# Patient Record
Sex: Male | Born: 1975
Health system: Southern US, Community
[De-identification: ages and names within clinical notes are randomized; demographics above are authoritative.]

## PROBLEM LIST (undated history)

## (undated) ENCOUNTER — Emergency Department (HOSPITAL_COMMUNITY): Payer: BC Managed Care – PPO | Source: Home / Self Care

## (undated) DIAGNOSIS — C221 Intrahepatic bile duct carcinoma: Secondary | ICD-10-CM

## (undated) DIAGNOSIS — M654 Radial styloid tenosynovitis [de Quervain]: Secondary | ICD-10-CM

## (undated) DIAGNOSIS — Z807 Family history of other malignant neoplasms of lymphoid, hematopoietic and related tissues: Secondary | ICD-10-CM

## (undated) DIAGNOSIS — C801 Malignant (primary) neoplasm, unspecified: Secondary | ICD-10-CM

## (undated) DIAGNOSIS — Z803 Family history of malignant neoplasm of breast: Secondary | ICD-10-CM

## (undated) DIAGNOSIS — I10 Essential (primary) hypertension: Secondary | ICD-10-CM

## (undated) DIAGNOSIS — E785 Hyperlipidemia, unspecified: Secondary | ICD-10-CM

## (undated) DIAGNOSIS — E133299 Other specified diabetes mellitus with mild nonproliferative diabetic retinopathy without macular edema, unspecified eye: Secondary | ICD-10-CM

## (undated) HISTORY — DX: Other specified diabetes mellitus with mild nonproliferative diabetic retinopathy without macular edema, unspecified eye: E13.3299

## (undated) HISTORY — PX: VASECTOMY: SHX75

## (undated) HISTORY — DX: Family history of other malignant neoplasms of lymphoid, hematopoietic and related tissues: Z80.7

## (undated) HISTORY — PX: CARPAL TUNNEL RELEASE: SHX101

## (undated) HISTORY — DX: Family history of malignant neoplasm of breast: Z80.3

## (undated) HISTORY — DX: Malignant (primary) neoplasm, unspecified: C80.1

## (undated) HISTORY — PX: TONSILLECTOMY AND ADENOIDECTOMY: SHX28

## (undated) MED FILL — Medication: Fill #0 | Status: CN

---

## 1998-07-29 ENCOUNTER — Emergency Department (HOSPITAL_COMMUNITY): Admission: EM | Admit: 1998-07-29 | Discharge: 1998-07-29 | Payer: Self-pay | Admitting: Emergency Medicine

## 1999-04-26 ENCOUNTER — Encounter: Payer: Self-pay | Admitting: Emergency Medicine

## 1999-04-26 ENCOUNTER — Emergency Department (HOSPITAL_COMMUNITY): Admission: EM | Admit: 1999-04-26 | Discharge: 1999-04-26 | Payer: Self-pay | Admitting: Emergency Medicine

## 2001-09-22 ENCOUNTER — Ambulatory Visit (HOSPITAL_BASED_OUTPATIENT_CLINIC_OR_DEPARTMENT_OTHER): Admission: RE | Admit: 2001-09-22 | Discharge: 2001-09-22 | Payer: Self-pay | Admitting: Family Medicine

## 2005-04-25 ENCOUNTER — Ambulatory Visit (HOSPITAL_COMMUNITY): Admission: RE | Admit: 2005-04-25 | Discharge: 2005-04-25 | Payer: Self-pay | Admitting: Family Medicine

## 2005-05-14 ENCOUNTER — Ambulatory Visit (HOSPITAL_COMMUNITY): Admission: RE | Admit: 2005-05-14 | Discharge: 2005-05-14 | Payer: Self-pay | Admitting: Orthopedic Surgery

## 2006-01-13 ENCOUNTER — Emergency Department (HOSPITAL_COMMUNITY): Admission: EM | Admit: 2006-01-13 | Discharge: 2006-01-13 | Payer: Self-pay | Admitting: Emergency Medicine

## 2006-01-18 ENCOUNTER — Emergency Department (HOSPITAL_COMMUNITY): Admission: EM | Admit: 2006-01-18 | Discharge: 2006-01-18 | Payer: Self-pay | Admitting: Family Medicine

## 2009-05-15 ENCOUNTER — Emergency Department (HOSPITAL_COMMUNITY): Admission: EM | Admit: 2009-05-15 | Discharge: 2009-05-15 | Payer: Self-pay | Admitting: Family Medicine

## 2010-04-02 ENCOUNTER — Inpatient Hospital Stay (INDEPENDENT_AMBULATORY_CARE_PROVIDER_SITE_OTHER)
Admission: RE | Admit: 2010-04-02 | Discharge: 2010-04-02 | Disposition: A | Discharge status: Home / Self Care | Payer: 59 | Source: Ambulatory Visit | Attending: Emergency Medicine | Admitting: Emergency Medicine

## 2010-04-02 DIAGNOSIS — J029 Acute pharyngitis, unspecified: Secondary | ICD-10-CM

## 2010-04-02 LAB — POCT INFECTIOUS MONO SCREEN: Mono Screen: NEGATIVE

## 2010-08-12 ENCOUNTER — Inpatient Hospital Stay (INDEPENDENT_AMBULATORY_CARE_PROVIDER_SITE_OTHER)
Admission: RE | Admit: 2010-08-12 | Discharge: 2010-08-12 | Disposition: A | Discharge status: Home / Self Care | Payer: 59 | Source: Ambulatory Visit | Attending: Family Medicine | Admitting: Family Medicine

## 2010-08-12 DIAGNOSIS — J020 Streptococcal pharyngitis: Secondary | ICD-10-CM

## 2010-08-12 LAB — POCT RAPID STREP A: Streptococcus, Group A Screen (Direct): NEGATIVE

## 2011-02-07 ENCOUNTER — Other Ambulatory Visit: Payer: Self-pay | Admitting: Orthopedic Surgery

## 2011-03-08 DIAGNOSIS — M654 Radial styloid tenosynovitis [de Quervain]: Secondary | ICD-10-CM

## 2011-03-08 HISTORY — DX: Radial styloid tenosynovitis (de quervain): M65.4

## 2011-03-12 ENCOUNTER — Encounter (HOSPITAL_BASED_OUTPATIENT_CLINIC_OR_DEPARTMENT_OTHER): Payer: Self-pay | Admitting: *Deleted

## 2011-03-12 NOTE — Pre-Procedure Instructions (Signed)
To come for BMET and EKG 

## 2011-03-13 ENCOUNTER — Encounter (HOSPITAL_BASED_OUTPATIENT_CLINIC_OR_DEPARTMENT_OTHER)
Admission: RE | Admit: 2011-03-13 | Discharge: 2011-03-13 | Disposition: A | Discharge status: Home / Self Care | Payer: 59 | Source: Ambulatory Visit | Attending: Orthopedic Surgery | Admitting: Orthopedic Surgery

## 2011-03-13 ENCOUNTER — Other Ambulatory Visit: Payer: Self-pay

## 2011-03-13 LAB — BASIC METABOLIC PANEL
BUN: 11 mg/dL (ref 6–23)
CO2: 29 mEq/L (ref 19–32)
Calcium: 9.9 mg/dL (ref 8.4–10.5)
Chloride: 100 mEq/L (ref 96–112)
Creatinine, Ser: 0.91 mg/dL (ref 0.50–1.35)
GFR calc Af Amer: >90 mL/min (ref >90)
Sodium: 138 mEq/L (ref 135–145)

## 2011-03-14 ENCOUNTER — Ambulatory Visit (HOSPITAL_BASED_OUTPATIENT_CLINIC_OR_DEPARTMENT_OTHER)
Admission: RE | Admit: 2011-03-14 | Discharge: 2011-03-14 | Disposition: A | Discharge status: Home / Self Care | Payer: 59 | Source: Ambulatory Visit | Attending: Orthopedic Surgery | Admitting: Orthopedic Surgery

## 2011-03-14 ENCOUNTER — Encounter (HOSPITAL_BASED_OUTPATIENT_CLINIC_OR_DEPARTMENT_OTHER): Payer: Self-pay | Admitting: Certified Registered Nurse Anesthetist

## 2011-03-14 ENCOUNTER — Encounter (HOSPITAL_BASED_OUTPATIENT_CLINIC_OR_DEPARTMENT_OTHER): Payer: Self-pay | Admitting: *Deleted

## 2011-03-14 ENCOUNTER — Encounter (HOSPITAL_BASED_OUTPATIENT_CLINIC_OR_DEPARTMENT_OTHER)
Admission: RE | Disposition: A | Discharge status: Home / Self Care | Payer: Self-pay | Source: Ambulatory Visit | Attending: Orthopedic Surgery

## 2011-03-14 ENCOUNTER — Ambulatory Visit (HOSPITAL_BASED_OUTPATIENT_CLINIC_OR_DEPARTMENT_OTHER): Payer: 59 | Admitting: Certified Registered Nurse Anesthetist

## 2011-03-14 DIAGNOSIS — E785 Hyperlipidemia, unspecified: Secondary | ICD-10-CM | POA: Insufficient documentation

## 2011-03-14 DIAGNOSIS — E119 Type 2 diabetes mellitus without complications: Secondary | ICD-10-CM | POA: Insufficient documentation

## 2011-03-14 DIAGNOSIS — I10 Essential (primary) hypertension: Secondary | ICD-10-CM | POA: Insufficient documentation

## 2011-03-14 DIAGNOSIS — M654 Radial styloid tenosynovitis [de Quervain]: Secondary | ICD-10-CM | POA: Insufficient documentation

## 2011-03-14 DIAGNOSIS — Z87891 Personal history of nicotine dependence: Secondary | ICD-10-CM | POA: Insufficient documentation

## 2011-03-14 DIAGNOSIS — K219 Gastro-esophageal reflux disease without esophagitis: Secondary | ICD-10-CM | POA: Insufficient documentation

## 2011-03-14 HISTORY — DX: Radial styloid tenosynovitis (de quervain): M65.4

## 2011-03-14 HISTORY — DX: Essential (primary) hypertension: I10

## 2011-03-14 HISTORY — DX: Hyperlipidemia, unspecified: E78.5

## 2011-03-14 HISTORY — PX: DORSAL COMPARTMENT RELEASE: SHX5039

## 2011-03-14 LAB — POCT HEMOGLOBIN-HEMACUE: Hemoglobin: 16 g/dL (ref 13.0–17.0)

## 2011-03-14 LAB — GLUCOSE, CAPILLARY: Glucose-Capillary: 173 mg/dL — ABNORMAL HIGH (ref 70–99)

## 2011-03-14 SURGERY — RELEASE, FIRST DORSAL COMPARTMENT, HAND
Anesthesia: General | Laterality: Left

## 2011-03-14 MED ORDER — CEPHALEXIN 500 MG PO CAPS: 500.0000 mg | ORAL_CAPSULE | Freq: Four times a day (QID) | ORAL | Status: AC

## 2011-03-14 MED ORDER — PROMETHAZINE HCL 25 MG/ML IJ SOLN: 6.2500 mg | INTRAMUSCULAR | Status: DC | PRN

## 2011-03-14 MED ORDER — LIDOCAINE HCL (CARDIAC) 20 MG/ML IV SOLN
INTRAVENOUS | Status: DC | PRN
  Administered 2011-03-14: 60 mg via INTRAVENOUS

## 2011-03-14 MED ORDER — CEFAZOLIN SODIUM 1-5 GM-% IV SOLN: 1.0000 g | INTRAVENOUS | Status: DC

## 2011-03-14 MED ORDER — MIDAZOLAM HCL 5 MG/5ML IJ SOLN
INTRAMUSCULAR | Status: DC | PRN
  Administered 2011-03-14: 2 mg via INTRAVENOUS

## 2011-03-14 MED ORDER — POVIDONE-IODINE 7.5 % EX SOLN: Freq: Once | CUTANEOUS | Status: DC

## 2011-03-14 MED ORDER — SODIUM CHLORIDE 0.45 % IV SOLN: INTRAVENOUS | Status: DC

## 2011-03-14 MED ORDER — FENTANYL CITRATE 0.05 MG/ML IJ SOLN
INTRAMUSCULAR | Status: DC | PRN
  Administered 2011-03-14: 50 ug via INTRAVENOUS
  Administered 2011-03-14: 100 ug via INTRAVENOUS

## 2011-03-14 MED ORDER — ONDANSETRON HCL 4 MG/2ML IJ SOLN
INTRAMUSCULAR | Status: DC | PRN
  Administered 2011-03-14: 4 mg via INTRAVENOUS

## 2011-03-14 MED ORDER — PROPOFOL 10 MG/ML IV EMUL
INTRAVENOUS | Status: DC | PRN
  Administered 2011-03-14: 400 mg via INTRAVENOUS

## 2011-03-14 MED ORDER — DROPERIDOL 2.5 MG/ML IJ SOLN
INTRAMUSCULAR | Status: DC | PRN
  Administered 2011-03-14: 0.625 mg via INTRAVENOUS

## 2011-03-14 MED ORDER — CEFAZOLIN SODIUM-DEXTROSE 2-3 GM-% IV SOLR
2.0000 g | INTRAVENOUS | Status: AC
  Administered 2011-03-14: 2 g via INTRAVENOUS

## 2011-03-14 MED ORDER — BETAMETHASONE SOD PHOS & ACET 6 (3-3) MG/ML IJ SUSP
INTRAMUSCULAR | Status: DC | PRN
  Administered 2011-03-14: 1.5 mL via INTRA_ARTICULAR

## 2011-03-14 MED ORDER — FENTANYL CITRATE 0.05 MG/ML IJ SOLN: 25.0000 ug | INTRAMUSCULAR | Status: DC | PRN

## 2011-03-14 MED ORDER — OXYCODONE HCL 5 MG PO CAPS: 10.0000 mg | ORAL_CAPSULE | ORAL | Status: AC | PRN

## 2011-03-14 MED ORDER — LACTATED RINGERS IV SOLN
INTRAVENOUS | Status: DC
  Administered 2011-03-14 (×2): via INTRAVENOUS

## 2011-03-14 MED ORDER — BUPIVACAINE HCL (PF) 0.25 % IJ SOLN
INTRAMUSCULAR | Status: DC | PRN
  Administered 2011-03-14: 19 mL

## 2011-03-14 SURGICAL SUPPLY — 51 items
APL SKNCLS STERI-STRIP NONHPOA (GAUZE/BANDAGES/DRESSINGS) ×1
BANDAGE CONFORM 3  STR LF (GAUZE/BANDAGES/DRESSINGS) ×2 IMPLANT
BANDAGE ELASTIC 3 VELCRO ST LF (GAUZE/BANDAGES/DRESSINGS) ×2 IMPLANT
BENZOIN TINCTURE PRP APPL 2/3 (GAUZE/BANDAGES/DRESSINGS) ×2 IMPLANT
BLADE SURG 15 STRL LF DISP TIS (BLADE) ×2 IMPLANT
BLADE SURG 15 STRL SS (BLADE) ×4
BRUSH SCRUB EZ PLAIN DRY (MISCELLANEOUS) ×2 IMPLANT
CLOSURE STERI STRIP 1/2 X4 (GAUZE/BANDAGES/DRESSINGS) ×1 IMPLANT
CLOTH BEACON ORANGE TIMEOUT ST (SAFETY) ×2 IMPLANT
CORDS BIPOLAR (ELECTRODE) ×1 IMPLANT
COVER MAYO STAND STRL (DRAPES) ×2 IMPLANT
COVER TABLE BACK 60X90 (DRAPES) ×2 IMPLANT
CUFF TOURNIQUET SINGLE 18IN (TOURNIQUET CUFF) ×1 IMPLANT
DRAPE EXTREMITY T 121X128X90 (DRAPE) ×2 IMPLANT
DRAPE SURG 17X23 STRL (DRAPES) ×2 IMPLANT
DRSG EMULSION OIL 3X3 NADH (GAUZE/BANDAGES/DRESSINGS) ×1 IMPLANT
GAUZE SPONGE 4X4 16PLY XRAY LF (GAUZE/BANDAGES/DRESSINGS) IMPLANT
GLOVE BIOGEL M STRL SZ7.5 (GLOVE) ×1 IMPLANT
GLOVE SS BIOGEL STRL SZ 8 (GLOVE) ×1 IMPLANT
GLOVE SUPERSENSE BIOGEL SZ 8 (GLOVE) ×1
GOWN PREVENTION PLUS XLARGE (GOWN DISPOSABLE) ×2 IMPLANT
GOWN PREVENTION PLUS XXLARGE (GOWN DISPOSABLE) ×2 IMPLANT
NDL HYPO 25X1 1.5 SAFETY (NEEDLE) ×1 IMPLANT
NDL SAFETY ECLIPSE 18X1.5 (NEEDLE) IMPLANT
NEEDLE HYPO 18GX1.5 SHARP (NEEDLE)
NEEDLE HYPO 25X1 1.5 SAFETY (NEEDLE) ×2 IMPLANT
NS IRRIG 1000ML POUR BTL (IV SOLUTION) ×2 IMPLANT
PACK BASIN DAY SURGERY FS (CUSTOM PROCEDURE TRAY) ×2 IMPLANT
PAD CAST 3X4 CTTN HI CHSV (CAST SUPPLIES) ×2 IMPLANT
PADDING CAST ABS 3INX4YD NS (CAST SUPPLIES)
PADDING CAST ABS 4INX4YD NS (CAST SUPPLIES) ×1
PADDING CAST ABS COTTON 3X4 (CAST SUPPLIES) IMPLANT
PADDING CAST ABS COTTON 4X4 ST (CAST SUPPLIES) ×1 IMPLANT
PADDING CAST COTTON 3X4 STRL (CAST SUPPLIES) ×4
SPLINT FIBERGLASS 3X35 (CAST SUPPLIES) ×1 IMPLANT
SPLINT PLASTER CAST XFAST 3X15 (CAST SUPPLIES) IMPLANT
SPLINT PLASTER XTRA FASTSET 3X (CAST SUPPLIES)
SPONGE GAUZE 4X4 12PLY (GAUZE/BANDAGES/DRESSINGS) ×1 IMPLANT
STOCKINETTE 4X48 STRL (DRAPES) ×2 IMPLANT
STOCKINETTE SYNTHETIC 3 UNSTER (CAST SUPPLIES) ×1 IMPLANT
STOCKINETTE TUBULAR COTT 4X25 (GAUZE/BANDAGES/DRESSINGS) IMPLANT
STOCKINETTE TUBULAR COTTON 2IN (GAUZE/BANDAGES/DRESSINGS) IMPLANT
STRIP CLOSURE SKIN 1/2X4 (GAUZE/BANDAGES/DRESSINGS) ×2 IMPLANT
SUT PROLENE 4 0 PS 2 18 (SUTURE) ×2 IMPLANT
SYR BULB 3OZ (MISCELLANEOUS) ×2 IMPLANT
SYR CONTROL 10ML LL (SYRINGE) ×2 IMPLANT
TOWEL OR 17X24 6PK STRL BLUE (TOWEL DISPOSABLE) ×2 IMPLANT
TOWEL OR NON WOVEN STRL DISP B (DISPOSABLE) ×3 IMPLANT
TRAY DSU PREP LF (CUSTOM PROCEDURE TRAY) ×2 IMPLANT
UNDERPAD 30X30 INCONTINENT (UNDERPADS AND DIAPERS) ×2 IMPLANT
WATER STERILE IRR 1000ML POUR (IV SOLUTION) ×1 IMPLANT

## 2011-03-14 NOTE — Transfer of Care (Signed)
Immediate Anesthesia Transfer of Care Note  Patient: Matthew Matthew  Procedure(s) Performed:  RELEASE DORSAL COMPARTMENT (DEQUERVAIN) - left wrist APL and EPL tenosynovectomy and 1st dorsal compartment release  Patient Location: PACU  Anesthesia Type: General  Level of Consciousness: sedated  Airway & Oxygen Therapy: Patient Spontanous Breathing and Patient connected to face mask oxygen  Post-op Assessment: Report given to PACU RN and Post -op Vital signs reviewed and stable  Post vital signs: Reviewed and stable  Complications: No apparent anesthesia complications

## 2011-03-14 NOTE — Anesthesia Postprocedure Evaluation (Signed)
  Anesthesia Post-op Note  Patient: Matthew Osborne  Procedure(s) Performed:  RELEASE DORSAL COMPARTMENT (DEQUERVAIN) - left wrist APL and EPL tenosynovectomy and 1st dorsal compartment release  Patient Location: PACU  Anesthesia Type: General  Level of Consciousness: awake, alert  and oriented  Airway and Oxygen Therapy: Patient Spontanous Breathing  Post-op Pain: none  Post-op Assessment: Post-op Vital signs reviewed, Patient's Cardiovascular Status Stable, Respiratory Function Stable, Patent Airway, No signs of Nausea or vomiting and Pain level controlled  Post-op Vital Signs: Reviewed and stable  Complications: No apparent anesthesia complications

## 2011-03-14 NOTE — H&P (Signed)
Matthew Osborne is an 36 y.o. male.   Chief Complaint: Left wrist pain HPI: Patient presents for left wrist reconstruction.  Past Medical History  Diagnosis Date  . Diabetes mellitus     NIDDM  . Hypertension     under control; has been on med. x 4 yrs.  . Hyperlipidemia   . De Quervain's tenosynovitis, left 03/2011    Past Surgical History  Procedure Date  . Carpal tunnel release     bilat.  . Tonsillectomy and adenoidectomy as a child  . Vasectomy     History reviewed. No pertinent family history. Social History:  reports that he has never smoked. He has never used smokeless tobacco. He reports that he does not drink alcohol or use illicit drugs.  Allergies: No Known Allergies  Medications Prior to Admission  Medication Dose Route Frequency Provider Last Rate Last Dose  . 0.45 % sodium chloride infusion   Intravenous Continuous Sheran Lawless, PA      . ceFAZolin (ANCEF) IVPB 2 g/50 mL premix  2 g Intravenous 60 min Pre-Op Oletta Cohn III, MD      . lactated ringers infusion   Intravenous Continuous Zenon Mayo, MD 20 mL/hr at 03/14/11 1105    . povidone-iodine (BETADINE) 7.5 % scrub   Topical Once Sheran Lawless, PA      . DISCONTD: ceFAZolin (ANCEF) IVPB 1 g/50 mL premix  1 g Intravenous 60 min Pre-Op Sheran Lawless, PA       Medications Prior to Admission  Medication Sig Dispense Refill  . atorvastatin (LIPITOR) 40 MG tablet Take 40 mg by mouth daily. AM      . bisoprolol-hydrochlorothiazide (ZIAC) 10-6.25 MG per tablet Take 1 tablet by mouth daily. AM      . metFORMIN (GLUCOPHAGE) 1000 MG tablet Take 1,000 mg by mouth 2 (two) times daily with a meal.        Results for orders placed during the hospital encounter of 03/14/11 (from the past 48 hour(s))  BASIC METABOLIC PANEL     Status: Abnormal   Collection Time   03/13/11  4:23 PM      Component Value Range Comment   Sodium 138  135 - 145 (mEq/L)    Potassium 4.1  3.5 - 5.1 (mEq/L)    Chloride 100   96 - 112 (mEq/L)    CO2 29  19 - 32 (mEq/L)    Glucose, Bld 126 (*) 70 - 99 (mg/dL)    BUN 11  6 - 23 (mg/dL)    Creatinine, Ser 1.61  0.50 - 1.35 (mg/dL)    Calcium 9.9  8.4 - 10.5 (mg/dL)    GFR calc non Af Amer >90  >90 (mL/min)    GFR calc Af Amer >90  >90 (mL/min)   GLUCOSE, CAPILLARY     Status: Abnormal   Collection Time   03/14/11 11:10 AM      Component Value Range Comment   Glucose-Capillary 212 (*) 70 - 99 (mg/dL)   POCT HEMOGLOBIN-HEMACUE     Status: Normal   Collection Time   03/14/11 11:13 AM      Component Value Range Comment   Hemoglobin 16.0  13.0 - 17.0 (g/dL)    No results found.  ROS  Blood pressure 132/86, pulse 52, temperature 97.7 F (36.5 C), resp. rate 20, height 6' (1.829 m), weight 124.739 kg (275 lb), SpO2 98.00%. Physical Exam ..The patient is alert and oriented in no  acute distress the patient complains of pain in the affected upper extremity. The patient is noted to have a normal HEENT exam. Lung fields show equal chest expansion and no shortness of breath abdomen exam is nontender without distention. Lower extremity examination does not show any fracture dislocation or blood clot symptoms. Pelvis is stable neck and back are stable and nontender Left wrist DC tenosynovitis with cyst formation and pain.  Ligaments stable  Assessment/Plan .Marland KitchenWe are planning surgery for your upper extremity. The risk and benefits of surgery include risk of bleeding infection anesthesia damage to normal structures and failure of the surgery to accomplish its intended goals of relieving symptoms and restoring function with this in mind we'll going to proceed. I have specifically discussed with the patient the pre-and postoperative regime and the does and don'ts and risk and benefits in great detail. Risk and benefits of surgery also include risk of dystrophy chronic nerve pain failure of the healing process to go onto completion and other inherent risks of surgery The relavent the  pathophysiology of the disease/injury process, as well as the alternatives for treatment and postoperative course of action has been discussed in great detail with the patient who desires to proceed.  We will do everything in our power to help you (the patient) restore function to the upper extremity. Is a pleasure to see this patient today.   Antwane Grose III,Betha Shadix M 03/14/2011, 1:18 PM

## 2011-03-14 NOTE — Brief Op Note (Signed)
03/14/2011  2:39 PM  PATIENT:  Matthew Osborne  36 y.o. male  PRE-OPERATIVE DIAGNOSIS:  left wrist dequervains  POST-OPERATIVE DIAGNOSIS:  left wrist dequervains  PROCEDURE:  Procedure(s): RELEASE DORSAL COMPARTMENT (DEQUERVAIN) with Tenosynovectomy extensor tendons left wrist  SURGEON:  Surgeon(s): Karen Chafe, MD  PHYSICIAN ASSISTANT:   ASSISTANTS: none   ANESTHESIA:   general  EBL:  Total I/O In: 1500 [I.V.:1500] Out: -   BLOOD ADMINISTERED:none  DRAINS: none   LOCAL MEDICATIONS USED:  MARCAINE 17CC  SPECIMEN:  No Specimen  DISPOSITION OF SPECIMEN:  N/A  COUNTS:  YES  TOURNIQUET:   Total Tourniquet Time Documented: Upper Arm (Left) - 24 minutes  DICTATION: .Other Dictation: Dictation Number [id number]  PLAN OF CARE: Discharge to home after PACU  PATIENT DISPOSITION:  PACU - hemodynamically stable.   Delay start of Pharmacological VTE agent (>24hrs) due to surgical blood loss or risk of bleeding:  {YES/NO/NOT APPLICABLE:20182

## 2011-03-14 NOTE — Anesthesia Preprocedure Evaluation (Addendum)
Anesthesia Evaluation  Patient identified by MRN, date of birth, ID band Patient awake    Reviewed: Allergy & Precautions, H&P , NPO status , Patient's Chart, lab work & pertinent test results, reviewed documented beta blocker date and time   History of Anesthesia Complications Negative for: history of anesthetic complications  Airway Mallampati: III TM Distance: >3 FB Neck ROM: Full  Mouth opening: Limited Mouth Opening  Dental No notable dental hx. (+) Teeth Intact and Dental Advisory Given   Pulmonary former smoker clear to auscultation  Pulmonary exam normal       Cardiovascular hypertension, Pt. on medications and Pt. on home beta blockers Regular Normal    Neuro/Psych Negative Neurological ROS     GI/Hepatic Neg liver ROS, GERD-  Controlled,  Endo/Other  Diabetes mellitus- (glu 212), Well Controlled, Type 2, Oral Hypoglycemic AgentsMorbid obesity  Renal/GU negative Renal ROS     Musculoskeletal   Abdominal (+) obese,   Peds  Hematology   Anesthesia Other Findings   Reproductive/Obstetrics                           Anesthesia Physical Anesthesia Plan  ASA: II  Anesthesia Plan: General   Post-op Pain Management:    Induction: Intravenous  Airway Management Planned: LMA  Additional Equipment:   Intra-op Plan:   Post-operative Plan:   Informed Consent: I have reviewed the patients History and Physical, chart, labs and discussed the procedure including the risks, benefits and alternatives for the proposed anesthesia with the patient or authorized representative who has indicated his/her understanding and acceptance.   Dental advisory given  Plan Discussed with: CRNA and Surgeon  Anesthesia Plan Comments: (Plan routine monitors, GA- LMA ok)        Anesthesia Quick Evaluation

## 2011-03-14 NOTE — Anesthesia Procedure Notes (Signed)
Procedure Name: LMA Insertion Date/Time: 03/14/2011 1:32 PM Performed by: Ashiyah Pavlak D Pre-anesthesia Checklist: Patient identified, Emergency Drugs available, Suction available and Patient being monitored Patient Re-evaluated:Patient Re-evaluated prior to inductionOxygen Delivery Method: Circle System Utilized Preoxygenation: Pre-oxygenation with 100% oxygen Intubation Type: IV induction Ventilation: Mask ventilation without difficulty LMA: LMA inserted LMA Size: 5.0 Number of attempts: 1 Placement Confirmation: positive ETCO2 Tube secured with: Tape Dental Injury: Teeth and Oropharynx as per pre-operative assessment

## 2011-03-15 NOTE — Op Note (Signed)
NAME:  Matthew Osborne, Matthew Osborne                      ACCOUNT NO.:  MEDICAL RECORD NO.:  [id number]  LOCATION:                                 FACILITY:  PHYSICIAN:  Dionne Ano. Felicitas Sine, M.D.     DATE OF BIRTH:  DATE OF PROCEDURE: DATE OF DISCHARGE:                              OPERATIVE REPORT   PREOPERATIVE DIAGNOSIS:  Chronic tenosynovitis, left first dorsal compartment with failure of conservative management.  POSTOPERATIVE DIAGNOSIS:  Chronic tenosynovitis, left first dorsal compartment with failure of conservative management.  PROCEDURE:  Radical tenosynovectomy, left wrist extensor apparatus with first dorsal compartment release.  SURGEON:  Dionne Ano. Amanda Pea, MD  ASSIST:  None.  COMPLICATIONS:  None.  ANESTHESIA:  General.  TOURNIQUET TIME:  Less than an hour.  DRAINS:  None.  INDICATIONS FOR THE PROCEDURE:  A 36 year old male with extensive tenosynovitis about the first  dorsal compartment.  I have counseled him in regards to risks and benefits of surgery, and he desires to proceed. All questions has been encouraged and answered preoperatively.  OPERATIVE PROCEDURE:  The patient was administered anesthesia, taken to the operative suite, underwent smooth induction of general anesthetic, time-out called.  He was prepped and draped in usual sterile fashion. Following this, a transverse incision was made over the dorsal distal wrist just proximal to the radial styloid.  Dissection was carried down. The superficial sensory nerves retracted out of harm's way, including superficial radial nerve.  The first dorsal compartment was released throughout its extent and following this, a radical tenosynovectomy of the APL and EPB tendons was accomplished.  He had marked tenosynovitis cystic formation x2 areas and a large amount of tenosynovitis which appeared to be not infectious.  After cleaning this out, I irrigated copiously, and performed further debridement as  necessary of  the tendons.  Following this, I then performed irrigation and closure of the wound after demonstrating full range of motion.  The patient does have some early synovitis and degenerative changes in the [facility] joint of the thumb, which we will watch closely.  This patient had rather marked findings and advanced tenosynovitic changes.  I am going to discharge him on Keflex, oxycodone and have him see [place] in the office in 6 days.  These notes have been discussed.  All questions have been encouraged and answered.     Dionne Ano. Amanda Pea, M.D.     [facility]  D:  03/14/2011  T:  03/15/2011  Job:  [id number]

## 2011-03-18 ENCOUNTER — Encounter (HOSPITAL_BASED_OUTPATIENT_CLINIC_OR_DEPARTMENT_OTHER): Payer: Self-pay | Admitting: Orthopedic Surgery

## 2011-08-06 ENCOUNTER — Encounter: Payer: 59 | Attending: Family Medicine | Admitting: *Deleted

## 2011-08-07 ENCOUNTER — Encounter: Payer: Self-pay | Admitting: *Deleted

## 2011-08-07 NOTE — Patient Instructions (Signed)
Goals:  Follow Diabetes Meal Plan as instructed  Eat 3 meals and 2 snacks, every 3-5 hrs  Limit carbohydrate intake to 45-60 grams carbohydrate/meal  Limit carbohydrate intake to 0-30 grams carbohydrate/snack  Add lean protein foods to meals/snacks  Monitor glucose levels as instructed by your doctor  Aim for 30 mins of physical activity daily as tolerated  Bring food record and glucose log to your next nutrition visit 

## 2011-08-07 NOTE — Progress Notes (Signed)
  Patient was seen on 08/06/2011 for the first of a series of three diabetes self-management courses at the [facility]. The following learning objectives were met by the patient during this course: Current A1c = 8.7%   Defines the role of glucose and insulin  Identifies type of diabetes and pathophysiology  Defines the diagnostic criteria for diabetes and prediabetes  States the risk factors for Type 2 Diabetes  States the symptoms of Type 2 Diabetes  Defines Type 2 Diabetes treatment goals  Defines Type 2 Diabetes treatment options  States the rationale for glucose monitoring  Identifies A1C, glucose targets, and testing times  Identifies proper sharps disposal  Defines the purpose of a diabetes food plan  Identifies carbohydrate food groups  Defines effects of carbohydrate foods on glucose levels  Identifies carbohydrate choices/grams/food labels  States benefits of physical activity and effect on glucose  Review of suggested activity guidelines  Handouts given during class include:  Type 2 Diabetes: Basics Book  My Food Plan Book  Food and Activity Log  Follow-Up Plan: Core Class 2

## 2011-08-15 ENCOUNTER — Ambulatory Visit: Payer: 59

## 2011-10-24 ENCOUNTER — Encounter: Payer: 59 | Attending: Family Medicine | Admitting: Dietician

## 2011-10-25 NOTE — Progress Notes (Signed)
  Patient was seen on 10/24/2011 for the second of a series of three diabetes self-management courses at the [facility]. The following learning objectives were met by the patient during this course:   Explain basic nutrition maintenance and quality assurance  Describe causes, symptoms and treatment of hypoglycemia and hyperglycemia  Explain how to manage diabetes during illness  Describe the importance of good nutrition for health and healthy eating strategies  List strategies to follow meal plan when dining out  Describe the effects of alcohol on glucose and how to use it safely  Describe problem solving skills for day-to-day glucose challenges  Describe strategies to use when treatment plan needs to change  Identify important factors involved in successful weight loss  Describe ways to remain physically active  Describe the impact of regular activity on insulin resistance    Handouts given in class:  Refrigerator magnet for Sick Day Guidelines  [facility] Oral medication/insulin handout  [facility] Weight Loss Handout  Blood Glucose Problem Solving (Class Exercise)  Follow-Up Plan: Patient will attend the final class of the ADA Diabetes Self-Care Education.   

## 2011-11-06 ENCOUNTER — Other Ambulatory Visit: Payer: Self-pay | Admitting: Physician Assistant

## 2011-11-06 DIAGNOSIS — R519 Headache, unspecified: Secondary | ICD-10-CM

## 2011-11-07 ENCOUNTER — Ambulatory Visit: Payer: 59

## 2011-11-08 ENCOUNTER — Other Ambulatory Visit (HOSPITAL_COMMUNITY): Payer: 59

## 2011-11-11 ENCOUNTER — Ambulatory Visit (HOSPITAL_COMMUNITY)
Admission: RE | Admit: 2011-11-11 | Discharge: 2011-11-11 | Payer: 59 | Source: Ambulatory Visit | Attending: Physician Assistant | Admitting: Physician Assistant

## 2011-11-14 ENCOUNTER — Ambulatory Visit (HOSPITAL_COMMUNITY): Payer: 59

## 2011-11-20 ENCOUNTER — Ambulatory Visit (HOSPITAL_COMMUNITY): Admission: RE | Admit: 2011-11-20 | Payer: 59 | Source: Ambulatory Visit

## 2011-11-27 ENCOUNTER — Ambulatory Visit (HOSPITAL_COMMUNITY): Admission: RE | Admit: 2011-11-27 | Payer: 59 | Source: Ambulatory Visit

## 2012-02-27 ENCOUNTER — Encounter: Payer: 59 | Attending: Family Medicine | Admitting: *Deleted

## 2012-02-27 DIAGNOSIS — E119 Type 2 diabetes mellitus without complications: Secondary | ICD-10-CM | POA: Insufficient documentation

## 2012-02-27 DIAGNOSIS — Z713 Dietary counseling and surveillance: Secondary | ICD-10-CM | POA: Insufficient documentation

## 2012-02-27 NOTE — Progress Notes (Signed)
  Patient was seen on 02/27/12 for the third of a series of three diabetes self-management courses at the [facility]. The following learning objectives were met by the patient during this course:    Describe how diabetes changes over time   Identify diabetes complications and ways to prevent them   Describe strategies that can promote heart health including lowering blood pressure and cholesterol   Describe strategies to lower dietary fat and sodium in the diet   Identify physical activities that benefit cardiovascular health   Evaluate success in meeting personal goal   Describe the belief that they can live successfully with diabetes day to day   Establish 2-3 goals that they will plan to diligently work on until they return for the free 31-month follow-up visit  The following handouts were given in class:  3 Month Follow Up Visit handout  Goal setting handout  Class evaluation form  Your patient has established the following 3 month goals for diabetes self-care:  Increase physical activity by being active 30 minutes 3 times a week  Count carbohydrates at most meals and snacks  Follow-Up Plan: Patient will attend a 3 month follow-up visit for diabetes self-management education.

## 2012-02-27 NOTE — Patient Instructions (Signed)
Goals:  Follow Diabetes Meal Plan as instructed  Eat 3 meals and 2 snacks, every 3-5 hrs  Limit carbohydrate intake to 45-60 grams carbohydrate/meal  Limit carbohydrate intake to 30 grams carbohydrate/snack  Add lean protein foods to meals/snacks  Monitor glucose levels as instructed by your doctor  Aim for 30 mins of physical activity daily  Bring food record and glucose log to your next nutrition visit   

## 2012-06-26 ENCOUNTER — Ambulatory Visit: Payer: 59 | Admitting: Dietician

## 2012-10-12 ENCOUNTER — Ambulatory Visit (INDEPENDENT_AMBULATORY_CARE_PROVIDER_SITE_OTHER): Payer: 59 | Admitting: Physician Assistant

## 2012-10-12 ENCOUNTER — Encounter: Payer: Self-pay | Admitting: Physician Assistant

## 2012-10-12 VITALS — BP 134/80 | HR 64 | Temp 97.5°F | Resp 20 | Wt 283.0 lb

## 2012-10-12 DIAGNOSIS — B9789 Other viral agents as the cause of diseases classified elsewhere: Secondary | ICD-10-CM

## 2012-10-12 DIAGNOSIS — Z205 Contact with and (suspected) exposure to viral hepatitis: Secondary | ICD-10-CM

## 2012-10-12 DIAGNOSIS — Z20828 Contact with and (suspected) exposure to other viral communicable diseases: Secondary | ICD-10-CM
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
## 2012-10-13 LAB — HEPATITIS PANEL, ACUTE
HCV Ab: NEGATIVE
Hep A IgM: NEGATIVE
Hepatitis B Surface Ag: NEGATIVE

## 2012-10-14 ENCOUNTER — Telehealth: Payer: Self-pay | Admitting: Family Medicine

## 2012-10-14 MED ORDER — AZITHROMYCIN 250 MG PO TABS: ORAL_TABLET | ORAL | Status: DC

## 2012-10-14 NOTE — Telephone Encounter (Signed)
Ok Azithromycin 250mg  2 Daily on day 1 1 Daily on days 2-5 # 6 (one pack) / 0.

## 2012-10-14 NOTE — Telephone Encounter (Signed)
Pt says that he is a lot worse. He is now running a fever and would like to have a z-pak called in.

## 2012-10-14 NOTE — Telephone Encounter (Signed)
Left patient mess on VM that Rx has been sent in for him.

## 2012-11-26 ENCOUNTER — Telehealth: Payer: Self-pay | Admitting: Family Medicine

## 2012-11-26 ENCOUNTER — Encounter: Payer: Self-pay | Admitting: Family Medicine

## 2012-11-26 MED ORDER — CHOLINE FENOFIBRATE 135 MG PO CPDR: 135.0000 mg | DELAYED_RELEASE_CAPSULE | Freq: Every day | ORAL | Status: DC

## 2012-11-26 MED ORDER — METFORMIN HCL 1000 MG PO TABS: 1000.0000 mg | ORAL_TABLET | Freq: Two times a day (BID) | ORAL | Status: DC

## 2012-11-26 MED ORDER — GLIPIZIDE 10 MG PO TABS: 10.0000 mg | ORAL_TABLET | Freq: Two times a day (BID) | ORAL | Status: DC

## 2012-11-26 MED ORDER — ATORVASTATIN CALCIUM 40 MG PO TABS: 40.0000 mg | ORAL_TABLET | Freq: Every day | ORAL | Status: DC

## 2012-11-26 NOTE — Telephone Encounter (Signed)
Refill Metformin 1000 mg BID,  Fenofibric 135 mg, Atorvastatin 40 mg and Glipizide 10 mg Medication refill for one time only.  Patient needs to be seen.  Letter sent for patient to call and schedule

## 2012-12-24 ENCOUNTER — Ambulatory Visit (INDEPENDENT_AMBULATORY_CARE_PROVIDER_SITE_OTHER): Payer: 59 | Admitting: Family Medicine

## 2012-12-24 ENCOUNTER — Encounter: Payer: Self-pay | Admitting: Family Medicine

## 2012-12-24 VITALS — BP 150/100 | HR 80 | Temp 97.0°F | Resp 20 | Ht 73.5 in | Wt 288.0 lb

## 2012-12-24 DIAGNOSIS — I10 Essential (primary) hypertension: Secondary | ICD-10-CM

## 2012-12-24 DIAGNOSIS — IMO0001 Reserved for inherently not codable concepts without codable children: Secondary | ICD-10-CM

## 2012-12-24 MED ORDER — BISOPROLOL-HYDROCHLOROTHIAZIDE 10-6.25 MG PO TABS: 1.0000 | ORAL_TABLET | Freq: Every day | ORAL | Status: DC

## 2012-12-24 NOTE — Progress Notes (Signed)
Subjective:    Patient ID: Matthew Osborne, male    DOB: 08-16-1975, 37 y.o.   MRN: [id number]  HPI  Patient has a history of type 2 diabetes mellitus and hypertension and hyperlipidemia with elevated triglycerides. He is discontinued all of his medications for approximately 2 months. He has been noncompliant. He admits to sporadically taking his medicines even prior to 2 months ago. He is not exercising. He is not watching his diet. His blood pressure is elevated today at 150/100. He denies any chest pain shortness of breath dyspnea exertion. He denies any blurry vision, polydipsia, or polyuria. Past Medical History  Diagnosis Date  . Diabetes mellitus     NIDDM  . Hypertension     under control; has been on med. x 4 yrs.  . Hyperlipidemia   . De Quervain's tenosynovitis, left 03/2011   Current Outpatient Prescriptions on File Prior to Visit  Medication Sig Dispense Refill  . atorvastatin (LIPITOR) 40 MG tablet Take 1 tablet (40 mg total) by mouth daily. AM  30 tablet  0  . Choline Fenofibrate (TRILIPIX) 135 MG capsule Take 1 capsule (135 mg total) by mouth daily.  30 capsule  0  . glipiZIDE (GLUCOTROL) 10 MG tablet Take 1 tablet (10 mg total) by mouth 2 (two) times daily before a meal.  60 tablet  0  . metFORMIN (GLUCOPHAGE) 1000 MG tablet Take 1 tablet (1,000 mg total) by mouth 2 (two) times daily with a meal.  60 tablet  0   No current facility-administered medications on file prior to visit.   Allergies  Allergen Reactions  . Niaspan [Niacin] Rash   History   Social History  . Marital Status: Married    Spouse Name: N/A    Number of Children: N/A  . Years of Education: N/A   Occupational History  . Not on file.   Social History Main Topics  . Smoking status: Never Smoker   . Smokeless tobacco: Never Used  . Alcohol Use: No  . Drug Use: No  . Sexual Activity:    Other Topics Concern  . Not on file   Social History Narrative  . No narrative on file     Review of  Systems  All other systems reviewed and are negative.       Objective:   Physical Exam  Vitals reviewed. Cardiovascular: Normal rate, regular rhythm and normal heart sounds.   Pulmonary/Chest: Effort normal and breath sounds normal. No respiratory distress. He has no wheezes. He has no rales.  Abdominal: Soft. Bowel sounds are normal. He exhibits no distension. There is no tenderness. There is no rebound and no guarding.  Musculoskeletal: He exhibits no edema.          Assessment & Plan:  1. Type II or unspecified type diabetes mellitus without mention of complication, uncontrolled Check hemoglobin A1c. Goal hemoglobin A1c is less than 6.5. Also check a fasting lipid panel. Goal LDL is less than 100. - COMPLETE METABOLIC PANEL WITH GFR; Future - Hemoglobin A1c; Future - Lipid panel; Future  2. HTN (hypertension) Recommended patient start taking aspirin 81 mg by mouth daily. I also recommended the patient resume his Ziac 10/6.25 one by mouth daily. Recheck his blood pressure in 2 weeks. Add an ACE inhibitor if still elevated.. - bisoprolol-hydrochlorothiazide (ZIAC) 10-6.25 MG per tablet; Take 1 tablet by mouth daily. AM  Dispense: 30 tablet; Refill: 11 - COMPLETE METABOLIC PANEL WITH GFR; Future - Hemoglobin A1c; Future - Lipid  panel; Future

## 2013-01-18 ENCOUNTER — Emergency Department (HOSPITAL_COMMUNITY)
Admission: EM | Admit: 2013-01-18 | Discharge: 2013-01-18 | Disposition: A | Discharge status: Home / Self Care | Payer: 59 | Attending: Emergency Medicine | Admitting: Emergency Medicine

## 2013-01-18 ENCOUNTER — Emergency Department (HOSPITAL_COMMUNITY): Payer: 59

## 2013-01-18 ENCOUNTER — Encounter (HOSPITAL_COMMUNITY): Payer: Self-pay | Admitting: Emergency Medicine

## 2013-01-18 DIAGNOSIS — Z79899 Other long term (current) drug therapy: Secondary | ICD-10-CM | POA: Insufficient documentation

## 2013-01-18 DIAGNOSIS — R079 Chest pain, unspecified: Secondary | ICD-10-CM | POA: Insufficient documentation

## 2013-01-18 DIAGNOSIS — M549 Dorsalgia, unspecified: Secondary | ICD-10-CM | POA: Insufficient documentation

## 2013-01-18 DIAGNOSIS — Z87891 Personal history of nicotine dependence: Secondary | ICD-10-CM | POA: Insufficient documentation

## 2013-01-18 DIAGNOSIS — Z8739 Personal history of other diseases of the musculoskeletal system and connective tissue: Secondary | ICD-10-CM | POA: Insufficient documentation

## 2013-01-18 DIAGNOSIS — E119 Type 2 diabetes mellitus without complications: Secondary | ICD-10-CM | POA: Insufficient documentation

## 2013-01-18 DIAGNOSIS — E785 Hyperlipidemia, unspecified: Secondary | ICD-10-CM | POA: Insufficient documentation

## 2013-01-18 DIAGNOSIS — I10 Essential (primary) hypertension: Secondary | ICD-10-CM | POA: Insufficient documentation

## 2013-01-18 DIAGNOSIS — E7800 Pure hypercholesterolemia, unspecified: Secondary | ICD-10-CM | POA: Insufficient documentation

## 2013-01-18 LAB — COMPREHENSIVE METABOLIC PANEL WITH GFR
ALT: 44 U/L (ref 0–53)
AST: 31 U/L (ref 0–37)
Albumin: 4 g/dL (ref 3.5–5.2)
Alkaline Phosphatase: 77 U/L (ref 39–117)
BUN: 13 mg/dL (ref 6–23)
CO2: 27 meq/L (ref 19–32)
Calcium: 9 mg/dL (ref 8.4–10.5)
Chloride: 99 meq/L (ref 96–112)
Creatinine, Ser: 0.93 mg/dL (ref 0.50–1.35)
GFR calc Af Amer: >90 mL/min (ref >90)
GFR calc non Af Amer: >90 mL/min (ref >90)
Glucose, Bld: 216 mg/dL — ABNORMAL HIGH (ref 70–99)
Potassium: 4.1 meq/L (ref 3.5–5.1)
Sodium: 136 meq/L (ref 135–145)
Total Bilirubin: 0.4 mg/dL (ref 0.3–1.2)
Total Protein: 7.4 g/dL (ref 6.0–8.3)

## 2013-01-18 LAB — CBC
HCT: 45.6 % (ref 39.0–52.0)
Hemoglobin: 15.7 g/dL (ref 13.0–17.0)
MCH: 31.2 pg (ref 26.0–34.0)
MCHC: 34.4 g/dL (ref 30.0–36.0)
MCV: 90.5 fL (ref 78.0–100.0)
Platelets: 251 K/uL (ref 150–400)
RBC: 5.04 MIL/uL (ref 4.22–5.81)
RDW: 12.6 % (ref 11.5–15.5)
WBC: 8 K/uL (ref 4.0–10.5)

## 2013-01-18 LAB — POCT I-STAT TROPONIN I: Troponin i, poc: 0.01 ng/mL (ref 0.00–0.08)

## 2013-01-18 LAB — TROPONIN I: Troponin I: <0.3 ng/mL (ref <0.30)

## 2013-01-18 MED ORDER — ONDANSETRON HCL 4 MG/2ML IJ SOLN
4.0000 mg | Freq: Once | INTRAMUSCULAR | Status: DC
  Filled 2013-01-18: qty 2

## 2013-01-18 MED ORDER — GI COCKTAIL ~~LOC~~
30.0000 mL | Freq: Once | ORAL | Status: AC
  Administered 2013-01-18: 30 mL via ORAL
  Filled 2013-01-18: qty 30

## 2013-01-18 MED ORDER — MORPHINE SULFATE 4 MG/ML IJ SOLN
6.0000 mg | Freq: Once | INTRAMUSCULAR | Status: AC
Start: 2013-01-18 — End: 2013-01-18
  Administered 2013-01-18: 6 mg via INTRAVENOUS
  Filled 2013-01-18: qty 2

## 2013-01-18 MED ORDER — IOHEXOL 350 MG/ML SOLN
100.0000 mL | Freq: Once | INTRAVENOUS | Status: AC | PRN
  Administered 2013-01-18: 100 mL via INTRAVENOUS

## 2013-01-18 MED ORDER — ASPIRIN 81 MG PO CHEW
324.0000 mg | CHEWABLE_TABLET | Freq: Once | ORAL | Status: AC
  Administered 2013-01-18: 324 mg via ORAL
  Filled 2013-01-18: qty 4

## 2013-01-18 MED ORDER — SODIUM CHLORIDE 0.9 % IV BOLUS (SEPSIS)
1000.0000 mL | Freq: Once | INTRAVENOUS | Status: AC
  Administered 2013-01-18: 1000 mL via INTRAVENOUS

## 2013-01-18 NOTE — ED Notes (Signed)
MD Zavitz at bedside  

## 2013-01-18 NOTE — ED Notes (Signed)
Pt returned from radiology.

## 2013-01-18 NOTE — ED Provider Notes (Signed)
CSN: [id number]     Arrival date & time 01/18/13  1735 History   First MD Initiated Contact with Patient 01/18/13 1754     Chief Complaint  Patient presents with  . Chest Pain   (Consider location/radiation/quality/duration/timing/severity/associated sxs/prior Treatment) HPI Comments: 37 yo male with htn, dm, chol, past smoker present with intermittent chest pain for over a week.  No known heart hx.  Pain stabbing, L chest radiating to back.  Worse with movement and also just at rest, burning sensation anterior chest, no known gerd or ulcer or gb hx.  No vomiting or exertional sxs.  Last episode started at 3 pm. Patient denies blood clot history, active cancer, recent major trauma or surgery, unilateral leg swelling/ pain, recent long travel, hemoptysis or oral contraceptives.  Patient is a 37 y.o. male presenting with chest pain. The history is provided by the patient.  Chest Pain Associated symptoms: shortness of breath (brief, resolved)   Associated symptoms: no abdominal pain, no back pain, no fever, no headache, no nausea and not vomiting     Past Medical History  Diagnosis Date  . Diabetes mellitus     NIDDM  . Hypertension     under control; has been on med. x 4 yrs.  . Hyperlipidemia   . De Quervain's tenosynovitis, left 03/2011   Past Surgical History  Procedure Laterality Date  . Carpal tunnel release      bilat.  . Tonsillectomy and adenoidectomy  as a child  . Vasectomy    . Dorsal compartment release  03/14/2011    Procedure: RELEASE DORSAL COMPARTMENT (DEQUERVAIN);  Surgeon: Karen Chafe, MD;  Location: [facility];  Service: Orthopedics;  Laterality: Left;  left wrist APL and EPL tenosynovectomy and 1st dorsal compartment release   Family History  Problem Relation Age of Onset  . Cancer Other   . Hypertension Other   . Hyperlipidemia Other    History  Substance Use Topics  . Smoking status: Former Games developer  . Smokeless tobacco: Never Used   . Alcohol Use: No    Review of Systems  Constitutional: Negative for fever and chills.  HENT: Negative for congestion.   Eyes: Negative for visual disturbance.  Respiratory: Positive for shortness of breath (brief, resolved).   Cardiovascular: Positive for chest pain.  Gastrointestinal: Negative for nausea, vomiting and abdominal pain.  Genitourinary: Negative for dysuria and flank pain.  Musculoskeletal: Negative for back pain, neck pain and neck stiffness.  Skin: Negative for rash.  Neurological: Negative for light-headedness and headaches.    Allergies  Niaspan  Home Medications   Current Outpatient Rx  Name  Route  Sig  Dispense  Refill  . atorvastatin (LIPITOR) 40 MG tablet   Oral   Take 40 mg by mouth daily.         . bisoprolol-hydrochlorothiazide (ZIAC) 10-6.25 MG per tablet   Oral   Take 1 tablet by mouth daily.         . Choline Fenofibrate (TRILIPIX) 135 MG capsule   Oral   Take 135 mg by mouth every evening.         [name] glipiZIDE (GLUCOTROL) 10 MG tablet   Oral   Take 10 mg by mouth 2 (two) times daily before a meal.         . metFORMIN (GLUCOPHAGE) 1000 MG tablet   Oral   Take 1,000 mg by mouth 2 (two) times daily with a meal.  BP 135/92  Pulse 73  Temp(Src) 98 F (36.7 C) (Oral)  Resp 19  Ht 6\' 2"  (1.88 m)  Wt 278 lb (126.1 kg)  BMI 35.68 kg/m2  SpO2 98% Physical Exam  Nursing note and vitals reviewed. Constitutional: He is oriented to person, place, and time. He appears well-developed and well-nourished.  HENT:  Head: Normocephalic and atraumatic.  Eyes: Conjunctivae are normal. Right eye exhibits no discharge. Left eye exhibits no discharge.  Neck: Normal range of motion. Neck supple. No tracheal deviation present.  Cardiovascular: Normal rate, regular rhythm, normal heart sounds and intact distal pulses.   No murmur heard. Pulmonary/Chest: Effort normal and breath sounds normal.  Abdominal: Soft. He exhibits no  distension. There is no tenderness. There is no guarding.  Musculoskeletal: He exhibits tenderness (thoracic paraspinal). He exhibits no edema.  Neurological: He is alert and oriented to person, place, and time.  Skin: Skin is warm. No rash noted.  Psychiatric: He has a normal mood and affect.    ED Course  Procedures (including critical care time) Labs Review Labs Reviewed  COMPREHENSIVE METABOLIC PANEL - Abnormal; Notable for the following:    Glucose, Bld 216 (*)    All other components within normal limits  CBC  TROPONIN I  POCT I-STAT TROPONIN I   Imaging Review Dg Chest 2 View  01/18/2013   CLINICAL DATA:  Chest pain and shortness of breath  EXAM: CHEST  2 VIEW  COMPARISON:  January 18, 2006  FINDINGS: The lungs are clear. Heart size and pulmonary vascularity are normal. No adenopathy. No pneumothorax. No bone lesions.  IMPRESSION: No abnormality noted.   Electronically Signed   By: Bretta Bang M.D.   On: 01/18/2013 18:28   Ct Angio Chest Pe W/cm &/or Wo Cm  01/18/2013   CLINICAL DATA:  Intermittent stabbing left-sided chest pain that radiates to the back, evaluate for thoracic dissection  EXAM: CT ANGIOGRAPHY CHEST, ABDOMEN AND PELVIS  TECHNIQUE: Multidetector CT imaging through the chest, abdomen and pelvis was performed using the standard protocol during bolus administration of intravenous contrast. Multiplanar reconstructed images including MIPs were obtained and reviewed to evaluate the vascular anatomy.  CONTRAST:  OMNIPAQUE IOHEXOL 350 MG/ML SOLN  COMPARISON:  Chest radiograph - earlier same day  FINDINGS: CTA CHEST FINDINGS  Vascular Findings:  Evaluation of the ascending thoracic aorta is degraded due to pulsation artifact. Given this limitation, there is no evidence of thoracic aortic aneurysm or dissection. Conventional configuration of the aortic arch. The branch vessels of the aortic arch widely patent throughout their imaged course. No definitive periaortic  stranding.  Normal heart size. Minimal calcifications within knee aortic valve leaflets are suspected. No pericardial effusion. Although this examination was not tailored tailored for the evaluation of the pulmonary arteries, there are no discrete filling defects within the pulmonary arterial tree to the level of the bilateral subsegmental pulmonary arteries. Normal caliber of the main pulmonary artery.  Thoracic aortic measurements:  Sinotubular junction  31 mm as measured in greatest oblique coronal dimension.  Proximal ascending aorta  35 mm as measured in greatest oblique axial dimension at the level of the main pulmonary artery.  Aortic arch aorta  23 mm as measured in greatest oblique sagittal dimension.  Proximal descending thoracic aorta  22 mm as measured in greatest oblique axial dimension at the level of the main pulmonary artery.  Distal descending thoracic aorta  20 mm as measured in greatest oblique axial dimension at the level of  the diaphragmatic hiatus.  Review of the MIP images confirms the above findings.  -------------------------------------------------------------  Non-Vascular Findings:  There is minimal slightly asymmetric subpleural ground-glass atelectasis, left greater than right. No focal airspace opacities. No pleural effusion or pneumothorax. The central pulmonary airways are widely patent. No discrete pulmonary nodules. Solitary shotty mediastinal lymph node within the thoracic inlet is not enlarged by size criteria measuring 9 mm in greatest short axis diameter (image 31, series 6). No mediastinal, hilar or axillary lymphadenopathy.  No acute or aggressive osseous abnormalities within the chest. Several Schmorl's nodes are noted within multiple vertebral endplates within the mid in caudal aspect of the thoracic spine. Incidental note is made of symmetric bilateral mild gynecomastia. Normal appearance of the imaged thyroid gland.    -----------------------------------------------------------------------------------------  CTA ABDOMEN AND PELVIS FINDINGS  Vascular Findings:  Abdominal aorta: There is no significant atherosclerotic plaque within the normal caliber abdominal aorta. No abdominal aortic dissection or periaortic stranding.  Celiac artery: Widely patent.  Conventional branching pattern  SMA: Widely patent; conventional branching pattern  Right Renal artery: Duplicated, there is an accessory right renal artery which supplies the inferior pole of the right kidney. Both the dominant accessory right-sided renal artery is widely patent  Left Renal artery: Solitary; widely patent  IMA: Widely patent  Pelvic vasculature: The bilateral common, external and internal iliac arteries are of normal caliber and widely patent without hemodynamically significant narrowing.  Review of the MIP images confirms the above findings.   --------------------------------------------------------------------------------  Nonvascular Findings:  Evaluation of the abdominal organs is limited to the arterial phase of enhancement.  Normal hepatic contour. There is diffuse decreased attenuation of the hepatic parenchyma on this postcontrast examination suggestive of hepatic steatosis. There is minimal focal fatty sparing adjacent to the gallbladder fossa. No discrete hyperenhancing hepatic lesions. Normal appearance of the gallbladder given degree of distention. No intra or extrahepatic biliary duct dilatation. No ascites.  There is symmetric enhancement of the bilateral kidneys. No definite renal stones on this postcontrast examination. No discrete renal lesions. No urinary obstruction or perinephric stranding. Normal appearance of the bilateral adrenal glands, pancreas and spleen.  Moderate colonic stool burden without evidence of obstruction. The bowel is otherwise normal in course and caliber without wall thickening. Normal appearance of the appendix. There is a  punctate (approximately 1.2 cm) fat containing (-62 Hounsfield units) nodule within in the left lower abdomen (image 270, series 6) which is nonspecific though could represent a previously torsed epiploic appendage. No associated adjacent stranding. No pneumoperitoneum, pneumatosis or portal venous gas.  Nonspecific porta hepatis adenopathy with index precaval lymph node measuring 2 cm in greatest short axis diameter (image 157, series 6). Otherwise, no retroperitoneal, mesenteric, pelvic or inguinal lymphadenopathy.  There is mild thickening of the urinary bladder wall, possibly accentuated due to underdistention. Otherwise, normal early arterial phase evaluation of the pelvic organs. No free fluid within the pelvis.  No acute or aggressive abnormalities within the abdomen or pelvis. Schmorl's nodes are seen within in the endplates of multiple lumbar vertebral bodies.  IMPRESSION: 1. No acute cardiopulmonary disease. Specifically, no evidence of thoracic aortic aneurysm or dissection. No evidence of pulmonary embolism to the level of the bilateral subsegmental pulmonary arteries. 2. No acute findings within the abdomen or pelvis. Specifically, no evidence of abdominal aortic aneurysm. No evidence of enteric or urinary obstruction. 3. Hepatic steatosis with nonspecific porta hepatis lymphadenopathy, possibly reactive in etiology. Correlation with LFTs is recommended.   Electronically Signed   By:  Simonne Come M.D.   On: 01/18/2013 21:11   Ct Angio Abd/pel W/ And/or W/o  01/18/2013   CLINICAL DATA:  Intermittent stabbing left-sided chest pain that radiates to the back, evaluate for thoracic dissection  EXAM: CT ANGIOGRAPHY CHEST, ABDOMEN AND PELVIS  TECHNIQUE: Multidetector CT imaging through the chest, abdomen and pelvis was performed using the standard protocol during bolus administration of intravenous contrast. Multiplanar reconstructed images including MIPs were obtained and reviewed to evaluate the vascular  anatomy.  CONTRAST:  OMNIPAQUE IOHEXOL 350 MG/ML SOLN  COMPARISON:  Chest radiograph - earlier same day  FINDINGS: CTA CHEST FINDINGS  Vascular Findings:  Evaluation of the ascending thoracic aorta is degraded due to pulsation artifact. Given this limitation, there is no evidence of thoracic aortic aneurysm or dissection. Conventional configuration of the aortic arch. The branch vessels of the aortic arch widely patent throughout their imaged course. No definitive periaortic stranding.  Normal heart size. Minimal calcifications within knee aortic valve leaflets are suspected. No pericardial effusion. Although this examination was not tailored tailored for the evaluation of the pulmonary arteries, there are no discrete filling defects within the pulmonary arterial tree to the level of the bilateral subsegmental pulmonary arteries. Normal caliber of the main pulmonary artery.  Thoracic aortic measurements:  Sinotubular junction  31 mm as measured in greatest oblique coronal dimension.  Proximal ascending aorta  35 mm as measured in greatest oblique axial dimension at the level of the main pulmonary artery.  Aortic arch aorta  23 mm as measured in greatest oblique sagittal dimension.  Proximal descending thoracic aorta  22 mm as measured in greatest oblique axial dimension at the level of the main pulmonary artery.  Distal descending thoracic aorta  20 mm as measured in greatest oblique axial dimension at the level of the diaphragmatic hiatus.  Review of the MIP images confirms the above findings.  -------------------------------------------------------------  Non-Vascular Findings:  There is minimal slightly asymmetric subpleural ground-glass atelectasis, left greater than right. No focal airspace opacities. No pleural effusion or pneumothorax. The central pulmonary airways are widely patent. No discrete pulmonary nodules. Solitary shotty mediastinal lymph node within the thoracic inlet is not enlarged by size  criteria measuring 9 mm in greatest short axis diameter (image 31, series 6). No mediastinal, hilar or axillary lymphadenopathy.  No acute or aggressive osseous abnormalities within the chest. Several Schmorl's nodes are noted within multiple vertebral endplates within the mid in caudal aspect of the thoracic spine. Incidental note is made of symmetric bilateral mild gynecomastia. Normal appearance of the imaged thyroid gland.   -----------------------------------------------------------------------------------------  CTA ABDOMEN AND PELVIS FINDINGS  Vascular Findings:  Abdominal aorta: There is no significant atherosclerotic plaque within the normal caliber abdominal aorta. No abdominal aortic dissection or periaortic stranding.  Celiac artery: Widely patent.  Conventional branching pattern  SMA: Widely patent; conventional branching pattern  Right Renal artery: Duplicated, there is an accessory right renal artery which supplies the inferior pole of the right kidney. Both the dominant accessory right-sided renal artery is widely patent  Left Renal artery: Solitary; widely patent  IMA: Widely patent  Pelvic vasculature: The bilateral common, external and internal iliac arteries are of normal caliber and widely patent without hemodynamically significant narrowing.  Review of the MIP images confirms the above findings.   --------------------------------------------------------------------------------  Nonvascular Findings:  Evaluation of the abdominal organs is limited to the arterial phase of enhancement.  Normal hepatic contour. There is diffuse decreased attenuation of the hepatic parenchyma on this  postcontrast examination suggestive of hepatic steatosis. There is minimal focal fatty sparing adjacent to the gallbladder fossa. No discrete hyperenhancing hepatic lesions. Normal appearance of the gallbladder given degree of distention. No intra or extrahepatic biliary duct dilatation. No ascites.  There is symmetric  enhancement of the bilateral kidneys. No definite renal stones on this postcontrast examination. No discrete renal lesions. No urinary obstruction or perinephric stranding. Normal appearance of the bilateral adrenal glands, pancreas and spleen.  Moderate colonic stool burden without evidence of obstruction. The bowel is otherwise normal in course and caliber without wall thickening. Normal appearance of the appendix. There is a punctate (approximately 1.2 cm) fat containing (-62 Hounsfield units) nodule within in the left lower abdomen (image 270, series 6) which is nonspecific though could represent a previously torsed epiploic appendage. No associated adjacent stranding. No pneumoperitoneum, pneumatosis or portal venous gas.  Nonspecific porta hepatis adenopathy with index precaval lymph node measuring 2 cm in greatest short axis diameter (image 157, series 6). Otherwise, no retroperitoneal, mesenteric, pelvic or inguinal lymphadenopathy.  There is mild thickening of the urinary bladder wall, possibly accentuated due to underdistention. Otherwise, normal early arterial phase evaluation of the pelvic organs. No free fluid within the pelvis.  No acute or aggressive abnormalities within the abdomen or pelvis. Schmorl's nodes are seen within in the endplates of multiple lumbar vertebral bodies.  IMPRESSION: 1. No acute cardiopulmonary disease. Specifically, no evidence of thoracic aortic aneurysm or dissection. No evidence of pulmonary embolism to the level of the bilateral subsegmental pulmonary arteries. 2. No acute findings within the abdomen or pelvis. Specifically, no evidence of abdominal aortic aneurysm. No evidence of enteric or urinary obstruction. 3. Hepatic steatosis with nonspecific porta hepatis lymphadenopathy, possibly reactive in etiology. Correlation with LFTs is recommended.   Electronically Signed   By: Simonne Come M.D.   On: 01/18/2013 21:11    EKG Interpretation    Date/Time:  Monday January 18 2013 17:44:01 EST Ventricular Rate:  69 PR Interval:  154 QRS Duration: 108 QT Interval:  380 QTC Calculation: 407 R Axis:   3 Text Interpretation:  Normal sinus rhythm Normal ECG Confirmed by Knoxx Boeding  MD, Quinn Quam ([id number]) on 01/18/2013 5:56:41 PM            MDM   1. Chest pain   2. Back pain    Differential GI vs cardiac vs dissection with sharp radiation. No blood clot risks. Few cardiac risks, no FH.  GI cocktail given.  Pain meds/ asa.  CT angio chest.  Pt improved significantly in ED.  Well appearing on recheck. EKG no acute findings.  CT no acute findings. Discussed need for stress test and further evaluation, pt wishes to go home and fup outpatient, he understands he may still have CAD with his risk factors.  Rec baby asa until cleared daily.  Results and differential diagnosis were discussed with the patient. Close follow up outpatient was discussed, patient comfortable with the plan.   Diagnosis: above     Enid Skeens, MD 01/19/13 0230

## 2013-01-18 NOTE — ED Notes (Signed)
Pt in radiology when RN went in room for assessment.

## 2013-01-18 NOTE — ED Notes (Signed)
Patient prepped for transport to CT.

## 2013-01-18 NOTE — ED Notes (Signed)
Pt c/o intermittent stabbing L sided chest pain that radiates to L back x 8 days.  Today pain is becoming unbearable with 1 10 min episode of sob.  Denies nausea or sob.  C/o burning sensation through chest.

## 2013-01-19 ENCOUNTER — Telehealth: Payer: Self-pay | Admitting: Family Medicine

## 2013-01-19 NOTE — Telephone Encounter (Signed)
He was seen in the ER for chest pain and ER doctor wants him to have stress test-Please call

## 2013-01-20 ENCOUNTER — Ambulatory Visit: Payer: 59 | Admitting: Family Medicine

## 2013-01-20 NOTE — Telephone Encounter (Signed)
Pt NTBS for hospital f/u. Pt aware per vm

## 2013-01-21 ENCOUNTER — Ambulatory Visit: Payer: 59 | Admitting: Physician Assistant

## 2013-06-30 ENCOUNTER — Ambulatory Visit (INDEPENDENT_AMBULATORY_CARE_PROVIDER_SITE_OTHER): Payer: 59 | Admitting: Physician Assistant

## 2013-06-30 ENCOUNTER — Encounter: Payer: Self-pay | Admitting: Physician Assistant

## 2013-06-30 VITALS — BP 154/104 | HR 68 | Temp 97.1°F | Resp 18 | Wt 277.0 lb

## 2013-06-30 DIAGNOSIS — I10 Essential (primary) hypertension: Secondary | ICD-10-CM

## 2013-06-30 DIAGNOSIS — E119 Type 2 diabetes mellitus without complications: Secondary | ICD-10-CM

## 2013-06-30 DIAGNOSIS — E785 Hyperlipidemia, unspecified: Secondary | ICD-10-CM

## 2013-06-30 DIAGNOSIS — L0291 Cutaneous abscess, unspecified: Secondary | ICD-10-CM

## 2013-06-30 DIAGNOSIS — L0390 Cellulitis, unspecified: Secondary | ICD-10-CM

## 2013-06-30 MED ORDER — CEPHALEXIN 500 MG PO CAPS: 500.0000 mg | ORAL_CAPSULE | Freq: Four times a day (QID) | ORAL | Status: DC

## 2013-06-30 NOTE — Progress Notes (Signed)
Patient ID: Matthew Osborne MRN: [id number], DOB: 1975/07/04, 38 y.o. Date of Encounter: @DATE @  Chief Complaint:  Chief Complaint  Patient presents with  . spider bite right middle finger    has been draining thick dark colored puss  . Medication Refill    refills on all meds    HPI: 38 y.o. year old white male  presents with above.   He shows me a spot on the dorsal aspect of his right third finger. Says that he has had a small bump at that exact same spot that has been coming and going away for quite some time. Says that yesterday that bump had reoccurred and was larger. He says yesterday he was pressing on it and was getting purulent drainage from it. Is much better today.  Says he's been going to the "[facility]" . (.Apparently, because his wife is a Clinical cytogeneticist, he can go there. ) Says they check A1c and his blood pressure but they do not check his cholesterol. Says his blood pressure has been reading good whenever he goes there.   Past Medical History  Diagnosis Date  . Diabetes mellitus     NIDDM  . Hypertension     under control; has been on med. x 4 yrs.  . Hyperlipidemia   . De Quervain's tenosynovitis, left 03/2011     Home Meds: Outpatient Prescriptions Prior to Visit  Medication Sig Dispense Refill  . atorvastatin (LIPITOR) 40 MG tablet Take 40 mg by mouth daily.      . bisoprolol-hydrochlorothiazide (ZIAC) 10-6.25 MG per tablet Take 1 tablet by mouth daily.      . Choline Fenofibrate (TRILIPIX) 135 MG capsule Take 135 mg by mouth every evening.      [name] glipiZIDE (GLUCOTROL) 10 MG tablet Take 10 mg by mouth 2 (two) times daily before a meal.      . metFORMIN (GLUCOPHAGE) 1000 MG tablet Take 1,000 mg by mouth 2 (two) times daily with a meal.       No facility-administered medications prior to visit.    Allergies:  Allergies  Allergen Reactions  . Niaspan [Niacin] Rash    History   Social History  . Marital Status: Married    Spouse Name:  N/A    Number of Children: N/A  . Years of Education: N/A   Occupational History  . Not on file.   Social History Main Topics  . Smoking status: Former Research scientist (life sciences)  . Smokeless tobacco: Never Used  . Alcohol Use: No  . Drug Use: No  . Sexual Activity: Not on file   Other Topics Concern  . Not on file   Social History Narrative  . No narrative on file    Family History  Problem Relation Age of Onset  . Cancer Other   . Hypertension Other   . Hyperlipidemia Other      Review of Systems:  See HPI for pertinent ROS. All other ROS negative.    Physical Exam: Blood pressure 154/104, pulse 68, temperature 97.1 F (36.2 C), temperature source Oral, resp. rate 18, weight 277 lb (125.646 kg)., Body mass index is 35.55 kg/(m^2). General: Overweight WM. Appears in no acute distress. Neck: Supple. No thyromegaly. No lymphadenopathy. No carotid bruit. Lungs: Clear bilaterally to auscultation without wheezes, rales, or rhonchi. Breathing is unlabored. Heart: RRR with S1 S2. No murmurs, rubs, or gallops. Abdomen: Soft, non-tender, non-distended with normoactive bowel sounds. No hepatomegaly. No rebound/guarding. No obvious abdominal masses.  Musculoskeletal:  Strength and tone normal for age. Extremities/Skin: Warm and dry. No LE edema. Right 3rd Finger: Dorsal Aspect: Pink papule--almost resolved now--only approx 57mm. No abscess. Neuro: Alert and oriented X 3. Moves all extremities spontaneously. Gait is normal. CNII-XII grossly in tact. Psych:  Responds to questions appropriately with a normal affect.     ASSESSMENT AND PLAN:  38 y.o. year old male with  1. Diabetes LOV for this here was Dr. Dennard Schaumann 12/24/2012. At that time patient did not return for future labs. Patient is not fasting today. Discussed just going ahead and doing CME T. and A1c while he is here but  he promises that he will return tomorrow morning fasting. - COMPLETE METABOLIC PANEL WITH GFR; Future - Hemoglobin A1c;  Future  2. Essential hypertension, benign Discussed with patient that his blood pressure is high today. He states it is always well controlled when he goes to the wellness clinic. Would not adjust medicines today. Continue current medicines but check labs monitor. - COMPLETE METABOLIC PANEL WITH GFR; Future  3. Hyperlipidemia - COMPLETE METABOLIC PANEL WITH GFR; Future - Lipid panel; Future  4. Cellulitis Given that he has been having recurrent skin lesion at the same sites and the fact that he was getting purulent drainage from the site yesterday we'll go ahead and treat with Keflex. - cephALEXin (KEFLEX) 500 MG capsule; Take 1 capsule (500 mg total) by mouth 4 (four) times daily.  Dispense: 28 capsule; Refill: 0  Discussed that he really needs to be having regular office visits and labs done here every 3 months. Unfortunately he is going to the wellness Center frequently but their labs and notes  are not showing up in Epic for me to review .   [address] [place], [place], [facility] 06/30/2013 1:48 PM

## 2013-07-01 ENCOUNTER — Other Ambulatory Visit: Payer: 59

## 2013-07-01 DIAGNOSIS — E119 Type 2 diabetes mellitus without complications: Secondary | ICD-10-CM

## 2013-07-01 DIAGNOSIS — E785 Hyperlipidemia, unspecified: Secondary | ICD-10-CM

## 2013-07-01 DIAGNOSIS — I10 Essential (primary) hypertension: Secondary | ICD-10-CM

## 2013-07-01 LAB — COMPLETE METABOLIC PANEL WITH GFR
ALBUMIN: 3.9 g/dL (ref 3.5–5.2)
ALK PHOS: 83 U/L (ref 39–117)
ALT: 49 U/L (ref 0–53)
AST: 30 U/L (ref 0–37)
BUN: 8 mg/dL (ref 6–23)
CALCIUM: 9.2 mg/dL (ref 8.4–10.5)
CHLORIDE: 101 meq/L (ref 96–112)
CO2: 28 mEq/L (ref 19–32)
Creat: 0.89 mg/dL (ref 0.50–1.35)
GFR, Est African American: >89 mL/min
GLUCOSE: 245 mg/dL — AB (ref 70–99)
POTASSIUM: 4.5 meq/L (ref 3.5–5.3)
Sodium: 136 mEq/L (ref 135–145)
TOTAL PROTEIN: 6.9 g/dL (ref 6.0–8.3)
Total Bilirubin: 0.6 mg/dL (ref 0.2–1.2)

## 2013-07-01 LAB — LIPID PANEL
CHOL/HDL RATIO: 5.3 ratio
CHOLESTEROL: 143 mg/dL (ref 0–200)
HDL: 27 mg/dL — ABNORMAL LOW (ref >39)
LDL Cholesterol: 75 mg/dL (ref 0–99)
Triglycerides: 206 mg/dL — ABNORMAL HIGH (ref <150)
VLDL: 41 mg/dL — ABNORMAL HIGH (ref 0–40)

## 2013-07-01 LAB — HEMOGLOBIN A1C
HEMOGLOBIN A1C: 10.1 % — AB (ref <5.7)
MEAN PLASMA GLUCOSE: 243 mg/dL — AB (ref <117)

## 2013-07-02 ENCOUNTER — Telehealth: Payer: Self-pay | Admitting: Family Medicine

## 2013-07-02 MED ORDER — METFORMIN HCL 1000 MG PO TABS: 1000.0000 mg | ORAL_TABLET | Freq: Two times a day (BID) | ORAL | Status: DC

## 2013-07-02 MED ORDER — PIOGLITAZONE HCL 45 MG PO TABS: 45.0000 mg | ORAL_TABLET | Freq: Every day | ORAL | Status: DC

## 2013-07-02 MED ORDER — CHOLINE FENOFIBRATE 135 MG PO CPDR: 135.0000 mg | DELAYED_RELEASE_CAPSULE | Freq: Every evening | ORAL | Status: DC

## 2013-07-02 MED ORDER — GLIPIZIDE 10 MG PO TABS: 10.0000 mg | ORAL_TABLET | Freq: Two times a day (BID) | ORAL | Status: DC

## 2013-07-02 MED ORDER — BISOPROLOL-HYDROCHLOROTHIAZIDE 10-6.25 MG PO TABS: 1.0000 | ORAL_TABLET | Freq: Every day | ORAL | Status: DC

## 2013-07-02 MED ORDER — ATORVASTATIN CALCIUM 40 MG PO TABS: 40.0000 mg | ORAL_TABLET | Freq: Every day | ORAL | Status: DC

## 2013-07-02 NOTE — Telephone Encounter (Signed)
Pt aware of lab results and provider recommendations.  Actos RX to pharmacy.  Pt has 3 mth appt made

## 2013-07-02 NOTE — Telephone Encounter (Signed)
Message copied by Olena Mater on Fri Jul 02, 2013  1:44 PM ------      Message from: Dena Billet      Created: Fri Jul 02, 2013 10:45 AM       I talked to patients wife on phone regarding A1C result (she is nurse in ER--I know her from working in ER).  Discussed adding insulin vs. Oral meds. She told me pt has not been taking current meds as directed--skips doses, etc. She says she will start making sure he takes them correctly. Was agreeable to add another pill as well as taking current meds correctly.       Tell them to add Actos 45 mg one po QD. Send Rx for # 30 + 2.       He is to also take the other medications as directed.      Also tell him to make sure to have followup office visit in 3 months. has scheduled an appointment for 8/26. Tell him to make sure to keep this appointment. ------

## 2013-07-06 ENCOUNTER — Telehealth: Payer: Self-pay | Admitting: Family Medicine

## 2013-07-06 DIAGNOSIS — E1165 Type 2 diabetes mellitus with hyperglycemia: Principal | ICD-10-CM

## 2013-07-06 DIAGNOSIS — IMO0001 Reserved for inherently not codable concepts without codable children: Secondary | ICD-10-CM

## 2013-07-06 MED ORDER — TRUEPLUS LANCETS 33G MISC: 1.0000 | Freq: Two times a day (BID) | Status: AC

## 2013-07-06 MED ORDER — GLUCOSE BLOOD VI STRP
1.0000 | ORAL_STRIP | Freq: Two times a day (BID) | Status: DC
Start: 2013-07-06 — End: 2014-11-03

## 2013-07-06 NOTE — Telephone Encounter (Signed)
Diabetic supplies refilled 

## 2013-09-29 ENCOUNTER — Ambulatory Visit: Payer: 59 | Admitting: Physician Assistant

## 2013-10-06 ENCOUNTER — Ambulatory Visit (INDEPENDENT_AMBULATORY_CARE_PROVIDER_SITE_OTHER): Payer: 59 | Admitting: Physician Assistant

## 2013-10-06 ENCOUNTER — Other Ambulatory Visit: Payer: Self-pay | Admitting: Physician Assistant

## 2013-10-06 ENCOUNTER — Encounter: Payer: Self-pay | Admitting: Physician Assistant

## 2013-10-06 VITALS — BP 110/80 | HR 82 | Temp 98.5°F | Resp 16 | Ht 73.5 in | Wt 275.0 lb

## 2013-10-06 DIAGNOSIS — E1165 Type 2 diabetes mellitus with hyperglycemia: Secondary | ICD-10-CM

## 2013-10-06 DIAGNOSIS — A499 Bacterial infection, unspecified: Secondary | ICD-10-CM

## 2013-10-06 DIAGNOSIS — B9689 Other specified bacterial agents as the cause of diseases classified elsewhere: Secondary | ICD-10-CM

## 2013-10-06 DIAGNOSIS — Z111 Encounter for screening for respiratory tuberculosis: Secondary | ICD-10-CM

## 2013-10-06 DIAGNOSIS — I10 Essential (primary) hypertension: Secondary | ICD-10-CM

## 2013-10-06 DIAGNOSIS — J988 Other specified respiratory disorders: Secondary | ICD-10-CM

## 2013-10-06 DIAGNOSIS — E119 Type 2 diabetes mellitus without complications: Secondary | ICD-10-CM

## 2013-10-06 DIAGNOSIS — Z0000 Encounter for general adult medical examination without abnormal findings: Secondary | ICD-10-CM

## 2013-10-06 DIAGNOSIS — E785 Hyperlipidemia, unspecified: Secondary | ICD-10-CM

## 2013-10-06 LAB — URINALYSIS, ROUTINE W REFLEX MICROSCOPIC
Bilirubin Urine: NEGATIVE
Glucose, UA: 500 mg/dL — AB
Hgb urine dipstick: NEGATIVE
KETONES UR: NEGATIVE mg/dL
LEUKOCYTES UA: NEGATIVE
NITRITE: NEGATIVE
PH: 5.5 (ref 5.0–8.0)
Protein, ur: NEGATIVE mg/dL
Specific Gravity, Urine: 1.015 (ref 1.005–1.030)
UROBILINOGEN UA: 0.2 mg/dL (ref 0.0–1.0)

## 2013-10-06 LAB — HEMOGLOBIN A1C, FINGERSTICK: Hgb A1C (fingerstick): 8.4 % — ABNORMAL HIGH (ref <5.7)

## 2013-10-06 MED ORDER — AZITHROMYCIN 500 MG PO TABS: 500.0000 mg | ORAL_TABLET | Freq: Every day | ORAL | Status: DC

## 2013-10-06 NOTE — Telephone Encounter (Signed)
Refill appropriate and filled per protocol. 

## 2013-10-06 NOTE — Progress Notes (Signed)
Patient ID: Matthew Osborne MRN: [id number], DOB: 25-Jul-1975, 38 y.o. Date of Encounter: @DATE @  Chief Complaint:  Chief Complaint  Patient presents with  . Annual Exam    HPI: 38 y.o. year old male  presents to have forms completed for his job. Also he states that his wife and children have all recently been diagnosed and treated for strep throat. He says that he does not have any significant sore throat but has had a persistent cough for one week now that is not resolving. No other complaints today.  I did review his last office visit note and last labs and result note from those labs. At that time I had recommended that he add Actos 45 mg daily. He says he has added this medication and is taking this medication in addition to his other diabetic medications.    Past Medical History  Diagnosis Date  . Diabetes mellitus     NIDDM  . Hypertension     under control; has been on med. x 4 yrs.  . Hyperlipidemia   . De Quervain's tenosynovitis, left 03/2011     Home Meds: Outpatient Prescriptions Prior to Visit  Medication Sig Dispense Refill  . atorvastatin (LIPITOR) 40 MG tablet Take 1 tablet (40 mg total) by mouth daily.  90 tablet  0  . bisoprolol-hydrochlorothiazide (ZIAC) 10-6.25 MG per tablet Take 1 tablet by mouth daily.  90 tablet  0  . cephALEXin (KEFLEX) 500 MG capsule Take 1 capsule (500 mg total) by mouth 4 (four) times daily.  28 capsule  0  . Choline Fenofibrate (TRILIPIX) 135 MG capsule Take 1 capsule (135 mg total) by mouth every evening.  90 capsule  0  . glipiZIDE (GLUCOTROL) 10 MG tablet Take 1 tablet (10 mg total) by mouth 2 (two) times daily before a meal.  90 tablet  0  . glucose blood test strip 1 each by Other route 2 (two) times daily. Use as instructed  100 each  11  . metFORMIN (GLUCOPHAGE) 1000 MG tablet Take 1 tablet (1,000 mg total) by mouth 2 (two) times daily with a meal.  180 tablet  0  . pioglitazone (ACTOS) 45 MG tablet Take 1 tablet (45 mg total)  by mouth daily.  90 tablet  0  . TRUEPLUS LANCETS 33G MISC 1 each by Does not apply route 2 (two) times daily.  100 each  11   No facility-administered medications prior to visit.    Allergies:  Allergies  Allergen Reactions  . Niaspan [Niacin] Rash    History   Social History  . Marital Status: Married    Spouse Name: N/A    Number of Children: N/A  . Years of Education: N/A   Occupational History  . Not on file.   Social History Main Topics  . Smoking status: Former Research scientist (life sciences)  . Smokeless tobacco: Never Used  . Alcohol Use: No  . Drug Use: No  . Sexual Activity: Not on file   Other Topics Concern  . Not on file   Social History Narrative  . No narrative on file    Family History  Problem Relation Age of Onset  . Cancer Other   . Hypertension Other   . Hyperlipidemia Other      Review of Systems:  See HPI for pertinent ROS. All other ROS negative.    Physical Exam: Blood pressure 110/80, pulse 82, temperature 98.5 F (36.9 C), temperature source Oral, resp. rate 16, height 6'  1.5" (1.867 m), weight 275 lb (124.739 kg)., Body mass index is 35.79 kg/(m^2). General: Overweight WM. Appears in no acute distress. Head: Normocephalic, atraumatic, eyes without discharge, sclera non-icteric, nares are without discharge. Bilateral auditory canals clear, TM's are without perforation, pearly grey and translucent with reflective cone of light bilaterally. Oral cavity moist, posterior pharynx with minimal erythema, no exudate, and no peritonsillar abscess. No tenderness with percussion of frontal and maxillary sinuses bilaterally.  Neck: Supple. No thyromegaly. No lymphadenopathy. Cervical lymph nodes are not tender or enlarged with palpation. Lungs: Clear bilaterally to auscultation without wheezes, rales, or rhonchi. Breathing is unlabored. Heart: RRR with S1 S2. No murmurs, rubs, or gallops. Abdomen: Soft, non-tender, non-distended with normoactive bowel sounds. No  hepatomegaly. No rebound/guarding. No obvious abdominal masses. Musculoskeletal:  Strength and tone normal for age. Extremities/Skin: Warm and dry.  No edema. 2+ bilateral dorsalis pedis and posterior tibial pulses bilaterally. Neuro: Alert and oriented X 3. Moves all extremities spontaneously. Gait is normal. CNII-XII grossly in tact. Psych:  Responds to questions appropriately with a normal affect.     Assessment/Plan:  38 y.o. y/o white male here for CPE  -1. Routine general medical examination at a health care facility  A. Screening Labs: He had a CMET and FLP 07/01/13. Check A1c now.  B. Screening For Prostate Cancer: No indication to start this until age 50.  C. Screening For Colorectal Cancer: No indication to start this until age 54.  D. Immunizations: Flu-----------N/A Tetanus----07/30/2005 Pneumococcal--- given that he has diabetes, he needs to have Pneumovax 23. Will discuss this with him at his next office visit. Zostavax----not indicated to discuss until age 77.   2. 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  THEY INCLUDE COMPLETE/THOROUGH MEDICAL HISTORY STATEMENT, AND MEDICAL EXAM REPORT--INCLUDES URINALYSIS AND TB SCREENING.  - Urinalysis, Routine w reflex microscopic Screening examination for pulmonary tuberculosis - PPD   2. Essential hypertension, benign Blood pressure is at goal.  3. Hyperlipidemia He had lipid panel 07/01/13 with LDL at goal at 75. Also had liver function test in 07/01/13 which were normal.  4. Type 2 diabetes mellitus with hyperglycemia - Hemoglobin A1C, fingerstick  5. Bacterial respiratory infection - azithromycin (ZITHROMAX) 500 MG tablet; Take 1 tablet (500 mg total) by mouth daily.  Dispense: 5 tablet; Refill: 0   Signed, [address] [place], [place], [facility] 10/06/2013 5:13 PM

## 2013-10-07 ENCOUNTER — Other Ambulatory Visit: Payer: Self-pay | Admitting: *Deleted

## 2013-10-07 MED ORDER — GLIPIZIDE 10 MG PO TABS: ORAL_TABLET | ORAL | Status: DC

## 2013-10-07 NOTE — Telephone Encounter (Signed)
Received fax requesting refill on Glipizide.   Refill appropriate and filled per protocol. 

## 2013-10-08 ENCOUNTER — Other Ambulatory Visit: Payer: Self-pay | Admitting: *Deleted

## 2013-10-08 ENCOUNTER — Ambulatory Visit: Payer: 59 | Admitting: *Deleted

## 2013-10-08 DIAGNOSIS — Z111 Encounter for screening for respiratory tuberculosis: Secondary | ICD-10-CM

## 2013-10-08 LAB — TB SKIN TEST
Induration: 0 mm
TB SKIN TEST: NEGATIVE

## 2013-10-08 MED ORDER — GLIPIZIDE 10 MG PO TABS: ORAL_TABLET | ORAL | Status: DC

## 2013-10-08 MED ORDER — SITAGLIPTIN PHOSPHATE 100 MG PO TABS: 100.0000 mg | ORAL_TABLET | Freq: Every day | ORAL | Status: DC

## 2013-10-08 NOTE — Telephone Encounter (Signed)
Received fax requesting refill on glipizide.   Refill appropriate and filled per protocol.

## 2013-10-14 ENCOUNTER — Ambulatory Visit (INDEPENDENT_AMBULATORY_CARE_PROVIDER_SITE_OTHER): Payer: 59 | Admitting: Physician Assistant

## 2013-10-14 ENCOUNTER — Encounter: Payer: Self-pay | Admitting: Physician Assistant

## 2013-10-14 VITALS — BP 138/90 | HR 80 | Temp 98.1°F | Resp 20 | Wt 281.0 lb

## 2013-10-14 DIAGNOSIS — B9689 Other specified bacterial agents as the cause of diseases classified elsewhere: Secondary | ICD-10-CM

## 2013-10-14 DIAGNOSIS — A499 Bacterial infection, unspecified: Secondary | ICD-10-CM

## 2013-10-14 DIAGNOSIS — J988 Other specified respiratory disorders: Secondary | ICD-10-CM

## 2013-10-14 MED ORDER — HYDROCODONE-HOMATROPINE 5-1.5 MG/5ML PO SYRP: 5.0000 mL | ORAL_SOLUTION | Freq: Three times a day (TID) | ORAL | Status: DC | PRN

## 2013-10-14 MED ORDER — ALBUTEROL SULFATE HFA 108 (90 BASE) MCG/ACT IN AERS: 2.0000 | INHALATION_SPRAY | Freq: Four times a day (QID) | RESPIRATORY_TRACT | Status: DC | PRN

## 2013-10-14 MED ORDER — LEVOFLOXACIN 750 MG PO TABS: 750.0000 mg | ORAL_TABLET | Freq: Every day | ORAL | Status: DC

## 2013-10-14 NOTE — Progress Notes (Signed)
Patient ID: Matthew Osborne MRN: [id number], DOB: 01-19-1976, 38 y.o. Date of Encounter: 10/14/2013, 5:13 PM    Chief Complaint:  Chief Complaint  Patient presents with  . Cough    was here last week with same symptoms, during day lil better but at night is worse esp when laying down,coughs so much to where he is getitng strangled, coughing up green phlgm as big as quarters     HPI: 38 y.o. year old male an office visit with me on 10/06/13. At that time I prescribed a Z-Pak. Patient states that the antibiotic did not help his symptoms at all. He says now he has a really bad cough. Says that during the day it is not so bad but at night it is severe. Is getting no sleep. Also has some "coughing spells  to where he "cannot stop coughing".  Also says that he is coughing up huge chunks of thick phlegm.  His had no fevers or chills. Sometimes at night he feels like he might be wheezing.     Home Meds:   Outpatient Prescriptions Prior to Visit  Medication Sig Dispense Refill  . atorvastatin (LIPITOR) 40 MG tablet TAKE 1 TABLET BY MOUTH ONCE DAILY  90 tablet  0  . azithromycin (ZITHROMAX) 500 MG tablet Take 1 tablet (500 mg total) by mouth daily.  5 tablet  0  . bisoprolol-hydrochlorothiazide (ZIAC) 10-6.25 MG per tablet Take 1 tablet by mouth daily.  90 tablet  0  . cephALEXin (KEFLEX) 500 MG capsule Take 1 capsule (500 mg total) by mouth 4 (four) times daily.  28 capsule  0  . Choline Fenofibrate (FENOFIBRIC ACID) 135 MG CPDR TAKE 1 CAPSULE BY MOUTH EVERY EVENING.  90 capsule  0  . glipiZIDE (GLUCOTROL) 10 MG tablet TAKE 1 TABLET BY MOUTH TWICE DAILY WITH MEALS  180 tablet  0  . glucose blood test strip 1 each by Other route 2 (two) times daily. Use as instructed  100 each  11  . metFORMIN (GLUCOPHAGE) 1000 MG tablet TAKE 1 TABLET BY MOUTH TWICE DAILY WITH MEALS  180 tablet  0  . pioglitazone (ACTOS) 45 MG tablet TAKE 1 TABLET BY MOUTH ONCE DAILY  90 tablet  0  . sitaGLIPtin (JANUVIA)  100 MG tablet Take 1 tablet (100 mg total) by mouth daily.  30 tablet  3  . TRUEPLUS LANCETS 33G MISC 1 each by Does not apply route 2 (two) times daily.  100 each  11   No facility-administered medications prior to visit.    Allergies:  Allergies  Allergen Reactions  . Niaspan [Niacin] Rash      Review of Systems: See HPI for pertinent ROS. All other ROS negative.    Physical Exam: Blood pressure 138/90, pulse 80, temperature 98.1 F (36.7 C), temperature source Oral, resp. rate 20, weight 281 lb (127.461 kg)., Body mass index is 36.57 kg/(m^2). General:  Obese WM. Appears in no acute distress. HEENT: Normocephalic, atraumatic, eyes without discharge, sclera non-icteric, nares are without discharge. Bilateral auditory canals clear, TM's are without perforation, pearly grey and translucent with reflective cone of light bilaterally. Oral cavity moist, posterior pharynx without exudate, erythema, peritonsillar abscess.  Neck: Supple. No thyromegaly. No lymphadenopathy. Lungs: Clear bilaterally to auscultation without wheezes, rales, or rhonchi. Breathing is unlabored. No wheezing on exam at this time. No rhonchi or other abnormal sounds. He does have a harsh deep cough repeatedly during his visit today. Heart: Regular rhythm. No murmurs,  rubs, or gallops. Msk:  Strength and tone normal for age. Extremities/Skin: Warm and dry. Neuro: Alert and oriented X 3. Moves all extremities spontaneously. Gait is normal. CNII-XII grossly in tact. Psych:  Responds to questions appropriately with a normal affect.     ASSESSMENT AND PLAN:  38 y.o. year old male with  1. Bacterial respiratory infection - levofloxacin (LEVAQUIN) 750 MG tablet; Take 1 tablet (750 mg total) by mouth daily.  Dispense: 10 tablet; Refill: 0 - HYDROcodone-homatropine (HYCODAN) 5-1.5 MG/5ML syrup; Take 5 mLs by mouth every 8 (eight) hours as needed for cough.  Dispense: 120 mL; Refill: 0 - albuterol (PROVENTIL HFA;VENTOLIN  HFA) 108 (90 BASE) MCG/ACT inhaler; Inhale 2 puffs into the lungs every 6 (six) hours as needed for wheezing or shortness of breath.  Dispense: 1 Inhaler; Refill: 0  Prescribe albuterol inhaler to have on hand in case he feels that he is having wheezing. I discussed with him that the Hycodan does contain codeine and to definitely not take this prior to driving or working and to only take it at night prior to sleep. Told him to make sure to use Mucinex D M. during the day as an expectorant to help keep this phlegm loose. Complete the Levaquin as directed. If symptoms are not resolved at the completion of Levaquin, then follow up.   [address] [place], [place], [facility] 10/14/2013 5:13 PM

## 2013-10-27 ENCOUNTER — Telehealth: Payer: Self-pay | Admitting: *Deleted

## 2013-10-27 DIAGNOSIS — J988 Other specified respiratory disorders: Secondary | ICD-10-CM

## 2013-10-27 DIAGNOSIS — B9689 Other specified bacterial agents as the cause of diseases classified elsewhere: Principal | ICD-10-CM

## 2013-10-27 MED ORDER — HYDROCODONE-HOMATROPINE 5-1.5 MG/5ML PO SYRP: 5.0000 mL | ORAL_SOLUTION | Freq: Three times a day (TID) | ORAL | Status: DC | PRN

## 2013-10-27 NOTE — Telephone Encounter (Signed)
Approved.  

## 2013-10-27 NOTE — Telephone Encounter (Signed)
Pt is still having cough and when was here last was prescribe cough syrup HYDROcodone-homatropine (HYCODAN) 5-1.5 MG/5ML syrup would like to have a refill.  Pharmacy [facility] outpt.

## 2013-10-27 NOTE — Telephone Encounter (Signed)
Rx printed and patient made aware need to pick up to take to pharmacy.

## 2013-11-08 ENCOUNTER — Telehealth: Payer: Self-pay | Admitting: Family Medicine

## 2013-11-08 DIAGNOSIS — J988 Other specified respiratory disorders: Secondary | ICD-10-CM

## 2013-11-08 NOTE — Telephone Encounter (Signed)
10/07/2103--- I prescribed Z-Pak 10/14/2013--- I prescribed Levaquin  On 10/14/13, at the same time as I was seeing patient for his visit,  his wife was seeing Dr. Dennard Schaumann for visit. Both of them were being seen for cough.  I just reviewed Dr. Samella Parr note regarding patient's wife. Dr. Dennard Schaumann did a swab to test for pertussis/whooping cough and this was negative. As well, even if positive for pertussis,  patient has been treated with Z-Pak which is treatment for pertussis.  Also, documented in patient's note with me 10/06/13 and also in Dr. Samella Parr note regarding pt's wife,  It was noted that the entire family has been sick and has not been treated with antibiotics but patient and his wife are not improving.  At this point I have treated patient with Z-Pak and subsequently Levaquin which is an extremely strong antibiotic.  Tell him that we will obtain chest x-ray. Place order for a chest x-ray.  If his chest x-ray is normal and he continues with these symptoms,  he will need to schedule followup office visit.

## 2013-11-08 NOTE — Telephone Encounter (Signed)
Called patient and told him to go have Chest X-ray done.  He told me to schedule it for next week at [facility].  I told him he does not need appt for routine xray.  He can just walk in anytime during the day.  I also told him to have done ASAP, not wait until next week if feeling that bad!!!

## 2013-11-08 NOTE — Telephone Encounter (Signed)
MBD saw him

## 2013-11-08 NOTE — Telephone Encounter (Signed)
Patient is calling to let you know that he is no better and is getting worse please call him at [phone] and let him know what he needs to do next

## 2013-11-09 ENCOUNTER — Ambulatory Visit (HOSPITAL_COMMUNITY)
Admission: RE | Admit: 2013-11-09 | Discharge: 2013-11-09 | Disposition: A | Discharge status: Home / Self Care | Payer: 59 | Source: Ambulatory Visit | Attending: Physician Assistant | Admitting: Physician Assistant

## 2013-11-09 DIAGNOSIS — R05 Cough: Secondary | ICD-10-CM | POA: Diagnosis present

## 2013-11-09 DIAGNOSIS — J988 Other specified respiratory disorders: Secondary | ICD-10-CM

## 2013-11-09 DIAGNOSIS — J069 Acute upper respiratory infection, unspecified: Secondary | ICD-10-CM | POA: Diagnosis not present

## 2013-11-09 DIAGNOSIS — Z87891 Personal history of nicotine dependence: Secondary | ICD-10-CM | POA: Diagnosis not present

## 2013-11-09 NOTE — Telephone Encounter (Signed)
Pt called to say he did have his xray done today at [facility].

## 2013-11-10 NOTE — Telephone Encounter (Signed)
Pt called with xray results  See result notes

## 2013-11-18 ENCOUNTER — Telehealth: Payer: Self-pay | Admitting: Family Medicine

## 2013-11-18 NOTE — Telephone Encounter (Signed)
[phone]   PT is calling he thought he was getting better last week but this week he is getting worse, his cough is back and he is wanting to know if he can have  Cough syrup and steroid called in for his cough. He was seen by MBD in September about this.

## 2013-11-18 NOTE — Telephone Encounter (Signed)
Left pt message to call first thing in AM and make appt to be seen

## 2013-11-18 NOTE — Telephone Encounter (Signed)
Have him schedule a visit for either Dr. Dennard Schaumann or Dr. Buelah Manis tomorrow.

## 2013-11-19 ENCOUNTER — Other Ambulatory Visit: Payer: Self-pay

## 2013-11-22 ENCOUNTER — Ambulatory Visit (INDEPENDENT_AMBULATORY_CARE_PROVIDER_SITE_OTHER): Payer: 59 | Admitting: Physician Assistant

## 2013-11-22 ENCOUNTER — Encounter: Payer: Self-pay | Admitting: Physician Assistant

## 2013-11-22 VITALS — BP 130/90 | HR 68 | Temp 98.5°F | Resp 20 | Wt 279.0 lb

## 2013-11-22 DIAGNOSIS — B9689 Other specified bacterial agents as the cause of diseases classified elsewhere: Secondary | ICD-10-CM

## 2013-11-22 DIAGNOSIS — J988 Other specified respiratory disorders: Secondary | ICD-10-CM

## 2013-11-22 MED ORDER — HYDROCODONE-HOMATROPINE 5-1.5 MG/5ML PO SYRP: 5.0000 mL | ORAL_SOLUTION | Freq: Three times a day (TID) | ORAL | Status: DC | PRN

## 2013-11-22 MED ORDER — AMOXICILLIN-POT CLAVULANATE 875-125 MG PO TABS: 1.0000 | ORAL_TABLET | Freq: Two times a day (BID) | ORAL | Status: DC

## 2013-11-22 NOTE — Progress Notes (Signed)
Patient ID: Matthew Osborne MRN: [id number], DOB: Nov 22, 1975, 38 y.o. Date of Encounter: 11/22/2013, 11:18 AM    Chief Complaint:  Chief Complaint  Patient presents with  . persisitant cough    x 2 months     HPI: 38 y.o. year old male an office visit with me on 10/06/13. At that time I prescribed a Z-Pak. He had f/u OV with me 10/14/2013. At that Pella her reported  that the antibiotic did not help his symptoms at all. A tthe OV 10/14/2013 he said  he had a really bad cough. Said that during the day it is not so bad but at night it is severe. Was getting no sleep. Also had some "coughing spells  to where he "cannot stop coughing".  Also said that he was coughing up huge chunks of thick phlegm. Reported he had no fevers or chills. Said that sometimes at night he felt like he might be wheezing.  At that visit, the following was my Assessment/Plan:  Bacterial respiratory infection - levofloxacin (LEVAQUIN) 750 MG tablet; Take 1 tablet (750 mg total) by mouth daily.  Dispense: 10 tablet; Refill: 0 - HYDROcodone-homatropine (HYCODAN) 5-1.5 MG/5ML syrup; Take 5 mLs by mouth every 8 (eight) hours as needed for cough.  Dispense: 120 mL; Refill: 0 - albuterol (PROVENTIL HFA;VENTOLIN HFA) 108 (90 BASE) MCG/ACT inhaler; Inhale 2 puffs into the lungs every 6 (six) hours as needed for wheezing or shortness of breath.  Dispense: 1 Inhaler; Refill: 0  Prescribe albuterol inhaler to have on hand in case he feels that he is having wheezing. I discussed with him that the Hycodan does contain codeine and to definitely not take this prior to driving or working and to only take it at night prior to sleep. Told him to make sure to use Mucinex D M. during the day as an expectorant to help keep this phlegm loose. Complete the Levaquin as directed. If symptoms are not resolved at the completion of Levaquin, then follow up.  Since that visit, he called stating that he had completed the Levaquin but still was  having cough. I ordered a chest x-ray which was performed 11/09/2013. I reviewed that result again today and it is completely normal.  Today he reports that if he is sitting still and calm he feels fine. However he says that if he exerts himself at all, then he goes into a "coughing spell" with repeated coughing.  Says it feels like a glob of mucus/phlegm in his throat. Says at this point he's wondering if it is just drainage down his throat from his head and nose. Says that what he does get out,  is extremely thick, dark secretions.      Home Meds:   Outpatient Prescriptions Prior to Visit  Medication Sig Dispense Refill  . albuterol (PROVENTIL HFA;VENTOLIN HFA) 108 (90 BASE) MCG/ACT inhaler Inhale 2 puffs into the lungs every 6 (six) hours as needed for wheezing or shortness of breath.  1 Inhaler  0  . atorvastatin (LIPITOR) 40 MG tablet TAKE 1 TABLET BY MOUTH ONCE DAILY  90 tablet  0  . bisoprolol-hydrochlorothiazide (ZIAC) 10-6.25 MG per tablet Take 1 tablet by mouth daily.  90 tablet  0  . Choline Fenofibrate (FENOFIBRIC ACID) 135 MG CPDR TAKE 1 CAPSULE BY MOUTH EVERY EVENING.  90 capsule  0  . glipiZIDE (GLUCOTROL) 10 MG tablet TAKE 1 TABLET BY MOUTH TWICE DAILY WITH MEALS  180 tablet  0  . glucose  blood test strip 1 each by Other route 2 (two) times daily. Use as instructed  100 each  11  . metFORMIN (GLUCOPHAGE) 1000 MG tablet TAKE 1 TABLET BY MOUTH TWICE DAILY WITH MEALS  180 tablet  0  . pioglitazone (ACTOS) 45 MG tablet TAKE 1 TABLET BY MOUTH ONCE DAILY  90 tablet  0  . sitaGLIPtin (JANUVIA) 100 MG tablet Take 1 tablet (100 mg total) by mouth daily.  30 tablet  3  . TRUEPLUS LANCETS 33G MISC 1 each by Does not apply route 2 (two) times daily.  100 each  11  . azithromycin (ZITHROMAX) 500 MG tablet Take 1 tablet (500 mg total) by mouth daily.  5 tablet  0  . cephALEXin (KEFLEX) 500 MG capsule Take 1 capsule (500 mg total) by mouth 4 (four) times daily.  28 capsule  0  .  HYDROcodone-homatropine (HYCODAN) 5-1.5 MG/5ML syrup Take 5 mLs by mouth every 8 (eight) hours as needed for cough.  120 mL  0  . levofloxacin (LEVAQUIN) 750 MG tablet Take 1 tablet (750 mg total) by mouth daily.  10 tablet  0   No facility-administered medications prior to visit.    Allergies:  Allergies  Allergen Reactions  . Niaspan [Niacin] Rash      Review of Systems: See HPI for pertinent ROS. All other ROS negative.    Physical Exam: Blood pressure 130/90, pulse 68, temperature 98.5 F (36.9 C), temperature source Oral, resp. rate 20, weight 279 lb (126.554 kg)., Body mass index is 36.31 kg/(m^2). General:  Obese WM. Appears in no acute distress. HEENT: Normocephalic, atraumatic, eyes without discharge, sclera non-icteric, nares are without discharge. Bilateral auditory canals clear, TM's are without perforation, pearly grey and translucent with reflective cone of light bilaterally. Oral cavity moist, posterior pharynx without exudate, erythema, peritonsillar abscess.  Neck: Supple. No thyromegaly. No lymphadenopathy. Lungs: Clear bilaterally to auscultation without wheezes, rales, or rhonchi. Breathing is unlabored. No wheezing on exam at this time. No rhonchi or other abnormal sounds. Heart: Regular rhythm. No murmurs, rubs, or gallops. Msk:  Strength and tone normal for age. Extremities/Skin: Warm and dry. Neuro: Alert and oriented X 3. Moves all extremities spontaneously. Gait is normal. CNII-XII grossly in tact. Psych:  Responds to questions appropriately with a normal affect.     ASSESSMENT AND PLAN:  38 y.o. year old male with   1. Bacterial respiratory infection - amoxicillin-clavulanate (AUGMENTIN) 875-125 MG per tablet; Take 1 tablet by mouth 2 (two) times daily.  Dispense: 20 tablet; Refill: 0 - HYDROcodone-homatropine (HYCODAN) 5-1.5 MG/5ML syrup; Take 5 mLs by mouth every 8 (eight) hours as needed for cough.  Dispense: 120 mL; Refill: 0 I also gave him samples  of Norel AD to take --hopefully this will help dry up drainage.  He is to followup with me if symptoms do not resolve with completion of Augmentin.    Signed, [address] [place], [place], [facility] 11/22/2013 11:18 AM

## 2013-12-06 ENCOUNTER — Ambulatory Visit (INDEPENDENT_AMBULATORY_CARE_PROVIDER_SITE_OTHER): Payer: 59 | Admitting: Physician Assistant

## 2013-12-06 ENCOUNTER — Encounter: Payer: Self-pay | Admitting: Physician Assistant

## 2013-12-06 VITALS — BP 112/80 | HR 70 | Temp 98.0°F | Resp 18 | Ht 74.0 in | Wt 280.0 lb

## 2013-12-06 DIAGNOSIS — M25562 Pain in left knee: Secondary | ICD-10-CM

## 2013-12-06 MED ORDER — TRAMADOL HCL 50 MG PO TABS: ORAL_TABLET | ORAL | Status: DC

## 2013-12-06 NOTE — Progress Notes (Signed)
Patient ID: Matthew Osborne MRN: [id number], DOB: [birth year], 38 y.o. Date of Encounter: 12/06/2013, 10:28 AM    Chief Complaint:  Chief Complaint  Patient presents with  . Left knee pain    X 1 week, has felt tightness last nigth radiates up to thigh, now has lumps on knee, can barely walk, has been putting icy/hot and taking advill for pain, sometimes when walkjs feel weak like he has fallen in [place].     HPI: 38 y.o. year old white male says that he first noticed any issues with this left knee last Sunday which was 8 days ago. He says that at that time he started noticing some discomfort at the medial aspect of the left knee. Since then he has occasionally felt some discomfort going over towards the lateral side as well. Says that last Sunday it felt like a hot poker burning at the medial aspect of the knee.  States that over this week multiple times his knee has given way.  States that last night there was an area along the medial joint line that appeared raised. Says that-- that same area felt very tight like a tight rubber band.  He states that he has never had problems with this knee in the past and has never had surgery to the knee in the past.  States that he has seen Dr. Amedeo Plenty at [place] [facility] for carpal tunnel surgery but has seen no other orthopedist.     Home Meds:   Outpatient Prescriptions Prior to Visit  Medication Sig Dispense Refill  . albuterol (PROVENTIL HFA;VENTOLIN HFA) 108 (90 BASE) MCG/ACT inhaler Inhale 2 puffs into the lungs every 6 (six) hours as needed for wheezing or shortness of breath. 1 Inhaler 0  . amoxicillin-clavulanate (AUGMENTIN) 875-125 MG per tablet Take 1 tablet by mouth 2 (two) times daily. 20 tablet 0  . atorvastatin (LIPITOR) 40 MG tablet TAKE 1 TABLET BY MOUTH ONCE DAILY 90 tablet 0  . bisoprolol-hydrochlorothiazide (ZIAC) 10-6.25 MG per tablet Take 1 tablet by mouth daily. 90 tablet 0  . Choline Fenofibrate (FENOFIBRIC ACID) 135 MG  CPDR TAKE 1 CAPSULE BY MOUTH EVERY EVENING. 90 capsule 0  . glipiZIDE (GLUCOTROL) 10 MG tablet TAKE 1 TABLET BY MOUTH TWICE DAILY WITH MEALS 180 tablet 0  . glucose blood test strip 1 each by Other route 2 (two) times daily. Use as instructed 100 each 11  . HYDROcodone-homatropine (HYCODAN) 5-1.5 MG/5ML syrup Take 5 mLs by mouth every 8 (eight) hours as needed for cough. 120 mL 0  . metFORMIN (GLUCOPHAGE) 1000 MG tablet TAKE 1 TABLET BY MOUTH TWICE DAILY WITH MEALS 180 tablet 0  . pioglitazone (ACTOS) 45 MG tablet TAKE 1 TABLET BY MOUTH ONCE DAILY 90 tablet 0  . sitaGLIPtin (JANUVIA) 100 MG tablet Take 1 tablet (100 mg total) by mouth daily. 30 tablet 3  . TRUEPLUS LANCETS 33G MISC 1 each by Does not apply route 2 (two) times daily. 100 each 11   No facility-administered medications prior to visit.    Allergies:  Allergies  Allergen Reactions  . Niaspan [Niacin] Rash      Review of Systems: See HPI for pertinent ROS. All other ROS negative.    Physical Exam: Blood pressure 112/80, pulse 70, temperature 98 F (36.7 C), temperature source Oral, resp. rate 18, height 6\' 2"  (1.88 m), weight 280 lb (127.007 kg)., Body mass index is 35.93 kg/(m^2). General:  WNWD WM. Appears in no acute distress. Neck: Supple.  No thyromegaly. No lymphadenopathy. Lungs: Clear bilaterally to auscultation without wheezes, rales, or rhonchi. Breathing is unlabored. Heart: Regular rhythm. No murmurs, rubs, or gallops. Msk:  Strength and tone normal for age. Left Knee: Severe tenderness with palpation along medial joint line.  Positive Grind Test Extremities/Skin: Warm and dry.  Neuro: Alert and oriented X 3. Moves all extremities spontaneously. Gait is normal. CNII-XII grossly in tact. Psych:  Responds to questions appropriately with a normal affect.     ASSESSMENT AND PLAN:  38 y.o. year old male with  1. Left knee pain I suspect Meniscus Tear. Will refer to Ortho--to get appt for this week. Pt says  work not an issue. Go ahead and prescribe some tramadol in case he needs it. He says that he will not take any pain medicine unless he absolutely has to and deftly does not want to take anything will make him loopy such as hydrocodone. - traMADol (ULTRAM) 50 MG tablet; 1 - 2 every 8 hours as needed for pain  Dispense: 60 tablet; Refill: 0 - Ambulatory referral to Orthopedic Surgery   Signed, [place], [place], [facility] 12/06/2013 10:28 AM

## 2014-02-14 ENCOUNTER — Encounter: Payer: Self-pay | Admitting: Family Medicine

## 2014-02-14 ENCOUNTER — Ambulatory Visit (INDEPENDENT_AMBULATORY_CARE_PROVIDER_SITE_OTHER): Payer: 59 | Admitting: Family Medicine

## 2014-02-14 VITALS — BP 156/88 | HR 84 | Temp 97.8°F | Resp 16 | Ht 74.0 in | Wt 284.0 lb

## 2014-02-14 DIAGNOSIS — R053 Chronic cough: Secondary | ICD-10-CM | POA: Insufficient documentation

## 2014-02-14 DIAGNOSIS — R05 Cough: Secondary | ICD-10-CM

## 2014-02-14 DIAGNOSIS — I10 Essential (primary) hypertension: Secondary | ICD-10-CM

## 2014-02-14 DIAGNOSIS — B9689 Other specified bacterial agents as the cause of diseases classified elsewhere: Secondary | ICD-10-CM

## 2014-02-14 DIAGNOSIS — E1165 Type 2 diabetes mellitus with hyperglycemia: Secondary | ICD-10-CM

## 2014-02-14 DIAGNOSIS — J988 Other specified respiratory disorders: Secondary | ICD-10-CM

## 2014-02-14 MED ORDER — METHYLPREDNISOLONE (PAK) 4 MG PO TABS: ORAL_TABLET | ORAL | Status: DC

## 2014-02-14 MED ORDER — FLUTICASONE PROPIONATE 50 MCG/ACT NA SUSP: 2.0000 | Freq: Every day | NASAL | Status: DC

## 2014-02-14 MED ORDER — ALBUTEROL SULFATE HFA 108 (90 BASE) MCG/ACT IN AERS: 2.0000 | INHALATION_SPRAY | Freq: Four times a day (QID) | RESPIRATORY_TRACT | Status: DC | PRN

## 2014-02-14 NOTE — Patient Instructions (Signed)
Try the prednisone for cough Use the albuterol Use the flonase for the post nasal drip Return for fasting labs Pulmonary referral if not better in 2-3 weeks F/U 3 months for diabetes

## 2014-02-14 NOTE — Assessment & Plan Note (Signed)
Uncontrolled diabetes, will return for fasting labs Needs to work on diet and weight loss Declines flu or pneumonia vaccine

## 2014-02-14 NOTE — Progress Notes (Signed)
Patient ID: Matthew Osborne, male   DOB: 03-11-1975, 39 y.o.   MRN: [id number]   Subjective:    Patient ID: Matthew Osborne, male    DOB: 06-Jun-1975, 39 y.o.   MRN: [id number]  Patient presents for Cough She here with chronic cough. He states he's been coughing for about 6 months now. I see where he was evaluated back in September with cough with production of sputum he was given antibiotics at that time, he then returned to the office on 10/19 with persistent cough it was productive of thick sputum and he was having some wheezing. He was given another round of antibiotics as well as an albuterol inhaler and he improved some in between but the cough continued to come back. CXR in Oct was normal.    The cough is now been on and off for the past few months he does have wheezing on and off as well. He has remote history of smoking but he quit about 15 years ago. He does not have any environmental exposures though he does work occasionally as a Civil engineer, contracting. He has had sinus congestion and drainage she typically has a cough up a lot first thing in the morning he is not using any nasal sprays for the sinuses he also complains of right ear pain states he has been pulling on it for the past couple days. He denies any chest pain with any other episodes. I'll review his medication he has history of diabetes and hypertension he has not followed up for fasting labs for this states that he feels like a [place] pig when he comes to our office and he has not been taking care of himself exercising or eating right. He has been on lisinopril HCTZ for about 9 or 10 years without any difficulties. He also denies any heartburn or reflux symptoms.      Review Of Systems:  GEN- denies fatigue, fever, weight loss,weakness, recent illness HEENT- denies eye drainage, change in vision, nasal discharge, CVS- denies chest pain, palpitations RESP- denies SOB, +cough, wheeze ABD- denies N/V, change in stools, abd pain GU- denies  dysuria, hematuria, dribbling, incontinence MSK- denies joint pain, muscle aches, injury Neuro- denies headache, dizziness, syncope, seizure activity       Objective:    BP 156/88 mmHg  Pulse 84  Temp(Src) 97.8 F (36.6 C) (Oral)  Resp 16  Ht 6\' 2"  (1.88 m)  Wt 284 lb (128.822 kg)  BMI 36.45 kg/m2 GEN- NAD, alert and oriented x3,obese  HEENT- PERRL, EOMI, non injected sclera, pink conjunctiva, MMM, oropharynx clear, nares clear rhinorrhea, enlarged right turbinate, no maxillary sinus tenderness, Bilat TM clear, canals clear Neck- Supple, no LAD CVS- RRR, no murmur RESP-few scattered wheeze, upper airway congestion, no rales, no rhonchi EXT- No edema Pulses- Radial 2+        Assessment & Plan:      Problem List Items Addressed This Visit      Unprioritized   Essential hypertension, benign - Primary    Bp elevated no meds taken, advised to return for fasting labs    Diabetes    Uncontrolled diabetes, will return for fasting labs Needs to work on diet and weight loss Declines flu or pneumonia vaccine    Chronic cough    DD, asthma vs chronic bronchitis, history of smoking, allergy/postnasal drip, GERD. on ACE but has been on for 9 years Will give albuterol, medrol dosepak and flonase, to treat for possibe asthma bronchitis, and post  nasal drip Hold on PPI If no improvement refer to Pulmonary     Other Visit Diagnoses    Bacterial respiratory infection        Relevant Medications       albuterol (PROVENTIL HFA;VENTOLIN HFA) 108 (90 BASE) MCG/ACT inhaler       Note: This dictation was prepared with Dragon dictation along with smaller phrase technology. Any transcriptional errors that result from this process are unintentional.

## 2014-02-14 NOTE — Assessment & Plan Note (Addendum)
DD, asthma vs chronic bronchitis, history of smoking, allergy/postnasal drip, GERD. on ACE but has been on for 9 years Will give albuterol, medrol dosepak and flonase, to treat for possibe asthma bronchitis, and post nasal drip Hold on PPI If no improvement refer to Pulmonary

## 2014-02-14 NOTE — Assessment & Plan Note (Signed)
Bp elevated no meds taken, advised to return for fasting labs

## 2014-04-14 ENCOUNTER — Ambulatory Visit (INDEPENDENT_AMBULATORY_CARE_PROVIDER_SITE_OTHER): Payer: 59 | Admitting: Physician Assistant

## 2014-04-14 ENCOUNTER — Encounter: Payer: Self-pay | Admitting: Physician Assistant

## 2014-04-14 VITALS — BP 152/86 | HR 84 | Temp 98.3°F | Resp 20 | Wt 280.0 lb

## 2014-04-14 DIAGNOSIS — J029 Acute pharyngitis, unspecified: Secondary | ICD-10-CM

## 2014-04-14 DIAGNOSIS — J300 Vasomotor rhinitis: Secondary | ICD-10-CM | POA: Diagnosis not present

## 2014-04-14 DIAGNOSIS — R05 Cough: Secondary | ICD-10-CM | POA: Diagnosis not present

## 2014-04-14 DIAGNOSIS — J988 Other specified respiratory disorders: Secondary | ICD-10-CM | POA: Diagnosis not present

## 2014-04-14 DIAGNOSIS — B9689 Other specified bacterial agents as the cause of diseases classified elsewhere: Secondary | ICD-10-CM

## 2014-04-14 DIAGNOSIS — R053 Chronic cough: Secondary | ICD-10-CM

## 2014-04-14 LAB — RAPID STREP SCREEN (MED CTR MEBANE ONLY): STREPTOCOCCUS, GROUP A SCREEN (DIRECT): NEGATIVE

## 2014-04-14 LAB — INFLUENZA A AND B
Inflenza A Ag: NEGATIVE
Influenza B Ag: NEGATIVE

## 2014-04-14 MED ORDER — AZITHROMYCIN 250 MG PO TABS: ORAL_TABLET | ORAL | Status: DC

## 2014-04-15 NOTE — Progress Notes (Signed)
Patient ID: Matthew Osborne MRN: [id number], DOB: Jan 27, 1976, 39 y.o. Date of Encounter: @DATE @  Chief Complaint:  Chief Complaint  Patient presents with  . sick x 3 days    cold, congestion, discolored phlegm, sore throat,  still with cough x many months    HPI: 39 y.o. year old white male  presents with above symptoms.  Several months ago he had multiple office visits and our office for persistent cough. Today he states that that problem still has not completely resolved. He says that he can go days sometimes even weeks with no problem. Says then he will have a spell of severe coughing. Says that it will happen if he has a sudden change in temperature----such as getting in and out of the car. Says that he has also had it happen when he's doing sweeping. Says that when he has those episodes in those situations he will suddenly feel drainage pouring out of his nose and down his throat and have episode of severe coughing spell.  Asked if he had ever uses albuterol inhaler with the episodes but he says that he never knows exactly when he is the episode will hit and once it does he just goes into a bad coughing spell.  In addition to the above, now he is having significant symptoms of respiratory infection. Lots of nasal congestion as well as thick mucus from the nose and congested bad cough.   Past Medical History  Diagnosis Date  . Diabetes mellitus     NIDDM  . Hypertension     under control; has been on med. x 4 yrs.  . Hyperlipidemia   . De Quervain's tenosynovitis, left 03/2011     Home Meds: Outpatient Prescriptions Prior to Visit  Medication Sig Dispense Refill  . albuterol (PROVENTIL HFA;VENTOLIN HFA) 108 (90 BASE) MCG/ACT inhaler Inhale 2 puffs into the lungs every 6 (six) hours as needed for wheezing or shortness of breath. 1 Inhaler 1  . atorvastatin (LIPITOR) 40 MG tablet TAKE 1 TABLET BY MOUTH ONCE DAILY 90 tablet 0  . bisoprolol-hydrochlorothiazide (ZIAC) 10-6.25  MG per tablet Take 1 tablet by mouth daily. 90 tablet 0  . Choline Fenofibrate (FENOFIBRIC ACID) 135 MG CPDR TAKE 1 CAPSULE BY MOUTH EVERY EVENING. 90 capsule 0  . fluticasone (FLONASE) 50 MCG/ACT nasal spray Place 2 sprays into both nostrils daily. 16 g 3  . glipiZIDE (GLUCOTROL) 10 MG tablet TAKE 1 TABLET BY MOUTH TWICE DAILY WITH MEALS 180 tablet 0  . glucose blood test strip 1 each by Other route 2 (two) times daily. Use as instructed 100 each 11  . metFORMIN (GLUCOPHAGE) 1000 MG tablet TAKE 1 TABLET BY MOUTH TWICE DAILY WITH MEALS 180 tablet 0  . pioglitazone (ACTOS) 45 MG tablet TAKE 1 TABLET BY MOUTH ONCE DAILY 90 tablet 0  . sitaGLIPtin (JANUVIA) 100 MG tablet Take 1 tablet (100 mg total) by mouth daily. 30 tablet 3  . traMADol (ULTRAM) 50 MG tablet 1 - 2 every 8 hours as needed for pain 60 tablet 0  . TRUEPLUS LANCETS 33G MISC 1 each by Does not apply route 2 (two) times daily. 100 each 11  . HYDROcodone-homatropine (HYCODAN) 5-1.5 MG/5ML syrup Take 5 mLs by mouth every 8 (eight) hours as needed for cough. (Patient not taking: Reported on 02/14/2014) 120 mL 0  . methylPREDNIsolone (MEDROL DOSPACK) 4 MG tablet follow package directions 21 tablet 0   No facility-administered medications prior to visit.  Allergies:  Allergies  Allergen Reactions  . Niaspan [Niacin] Rash    History   Social History  . Marital Status: Married    Spouse Name: N/A  . Number of Children: N/A  . Years of Education: N/A   Occupational History  . Not on file.   Social History Main Topics  . Smoking status: Former Research scientist (life sciences)  . Smokeless tobacco: Never Used  . Alcohol Use: No  . Drug Use: No  . Sexual Activity: Not on file   Other Topics Concern  . Not on file   Social History Narrative    Family History  Problem Relation Age of Onset  . Cancer Other   . Hypertension Other   . Hyperlipidemia Other      Review of Systems:  See HPI for pertinent ROS. All other ROS negative.    Physical  Exam: Blood pressure 152/86, pulse 84, temperature 98.3 F (36.8 C), temperature source Oral, resp. rate 20, weight 280 lb (127.007 kg)., Body mass index is 35.93 kg/(m^2). General: Obese WM. Appears in no acute distress. Head: Normocephalic, atraumatic, eyes without discharge, sclera non-icteric, nares are without discharge. Bilateral auditory canals clear, TM's are without perforation, pearly grey and translucent with reflective cone of light bilaterally. Oral cavity moist, posterior pharynx without exudate, erythema, peritonsillar abscess. No tenderness with percussion of frontal or maxillary sinuses bilaterally.  Neck: Supple. No thyromegaly. No lymphadenopathy. Lungs: Clear bilaterally to auscultation without wheezes, rales, or rhonchi. Breathing is unlabored. Heart: RRR with S1 S2. No murmurs, rubs, or gallops. Musculoskeletal:  Strength and tone normal for age. Extremities/Skin: Warm and dry. Neuro: Alert and oriented X 3. Moves all extremities spontaneously. Gait is normal. CNII-XII grossly in tact. Psych:  Responds to questions appropriately with a normal affect.   Results for orders placed or performed in visit on 04/14/14  Rapid strep screen  Result Value Ref Range   Source THROAT    Streptococcus, Group A Screen (Direct) NEG NEGATIVE  Influenza a and b  Result Value Ref Range   Source-INFBD NASAL    Inflenza A Ag NEG Negative   Influenza B Ag NEG Negative     ASSESSMENT AND PLAN:  39 y.o. year old male with  1. Bacterial respiratory infection - azithromycin (ZITHROMAX) 250 MG tablet; Day 1: Take 2 daily.  Days 2-5: Take 1 days.  Dispense: 6 tablet; Refill: 0  2. Chronic cough - Rapid strep screen - Influenza a and b - Ambulatory referral to Allergy  3. Vasomotor rhinitis - Rapid strep screen - Influenza a and b - Ambulatory referral to Allergy  4. Sorethroat - Rapid strep screen - Influenza a and b  He is on no ACE inhibitor. Will refer to asthma and  allergies center for evaluation.  Will treat acute current symptoms with azithromycin. Will up if the symptoms do not resolve within 1 week after completion of antibiotic.  Signed, [address] [place], [place], [facility] 04/15/2014 11:01 AM

## 2014-05-05 ENCOUNTER — Telehealth: Payer: Self-pay | Admitting: Family Medicine

## 2014-05-05 NOTE — Telephone Encounter (Signed)
Dr Ishmael Holter calling from [facility].  Would like to discuss his patient with you.  Not sure if airway obstruction process.  Discussed something about past history of resp condition.  Allergy testing not show much.  Does want to speak to you please call when you return.

## 2014-05-09 NOTE — Telephone Encounter (Signed)
I called and spoke to Dr. Ishmael Holter.  She did spirometry --pre and post bronchodilators--showed no signs of bronchospasm, no reversible changes with bronchodialtor. No wheeze on exam Reviewed normal CXR She told him to take Xyzal, Flonase, QVar, GERD medication AS DIRECTED (even the ones of these meds that I had prescribed, he had not been taking routinely, as directed).  To f/u with her end of April/first of May.  We discussed and decided that if cough does not resolve with this treatment, then next would probably need f/u with Pulmonary.

## 2014-05-19 ENCOUNTER — Institutional Professional Consult (permissible substitution): Payer: 59 | Admitting: Internal Medicine

## 2014-05-27 ENCOUNTER — Institutional Professional Consult (permissible substitution): Payer: 59 | Admitting: Pulmonary Disease

## 2014-05-27 ENCOUNTER — Ambulatory Visit (INDEPENDENT_AMBULATORY_CARE_PROVIDER_SITE_OTHER): Payer: 59 | Admitting: Pulmonary Disease

## 2014-05-27 ENCOUNTER — Other Ambulatory Visit: Payer: 59

## 2014-05-27 ENCOUNTER — Ambulatory Visit (INDEPENDENT_AMBULATORY_CARE_PROVIDER_SITE_OTHER)
Admission: RE | Admit: 2014-05-27 | Discharge: 2014-05-27 | Disposition: A | Discharge status: Home / Self Care | Payer: 59 | Source: Ambulatory Visit | Attending: Pulmonary Disease | Admitting: Pulmonary Disease

## 2014-05-27 ENCOUNTER — Encounter: Payer: Self-pay | Admitting: Pulmonary Disease

## 2014-05-27 VITALS — BP 162/64 | HR 70 | Ht 74.0 in | Wt 280.0 lb

## 2014-05-27 DIAGNOSIS — R05 Cough: Secondary | ICD-10-CM

## 2014-05-27 DIAGNOSIS — R053 Chronic cough: Secondary | ICD-10-CM

## 2014-05-27 DIAGNOSIS — R0981 Nasal congestion: Secondary | ICD-10-CM | POA: Diagnosis not present

## 2014-05-27 DIAGNOSIS — R059 Cough, unspecified: Secondary | ICD-10-CM

## 2014-05-27 MED ORDER — BENZONATATE 200 MG PO CAPS: 200.0000 mg | ORAL_CAPSULE | Freq: Three times a day (TID) | ORAL | Status: DC | PRN

## 2014-05-27 NOTE — Assessment & Plan Note (Signed)
It sounds as if his sinus congestion is fairly well controlled with Flonase but he still has some persistent symptoms. I explained to him that this is most likely allergic rhinitis and I recommended daily antihistamine use. However, he is quite anxious about the possibility that he may have Wegener's because he has had 2 family members with this condition. He has no other signs or symptoms of Wegener's and so I gave him reassurance today. However, he would like to be tested.  Plan: Serum ANCA Antihistamine and Flonase as detailed above

## 2014-05-27 NOTE — Progress Notes (Signed)
Subjective:    Patient ID: Matthew Osborne, male    DOB: [birth year], 40 y.o.   MRN: [id number]  HPI Chief Complaint  Patient presents with  . Advice Only    Self referral for cough.  c/o sometimes prod cough with white/green/brown mucus X7 months.     This is a very pleasant 39 year old male who comes to our clinic today for evaluation of chronic cough. He says that as a child he had no respiratory illnesses or respiratory problems. However, he started smoking as a teenager and smoked as much as 3 packs of cigarettes daily for approximately 6 years. He quit while he was in his 62s.  For the last 8 months he has had a cough which is productive of significant amounts of mucus. He says the mucus is ranged in color from green to brown to white. This has not been associated with shortness of breath. He says that he has tried treating sinus symptoms with Flonase but his cough may have improved only slightly with this. The cough occurs randomly throughout the course of the day. It can occur when he goes for a walk, when he is at rest. It does not occur in conjunction with eating. He does not have heartburn. He says his sinus symptoms have improved somewhat. He can't tell where the mucus is coming from, sometimes it feels like it's coming from his sinuses other times it's coming from his lungs. He has not had significant wheezing during this time.  His primary care physician tried treating him with an inhaled corticosteroid, and recommended an acid suppressor. It's unclear to me if he is actually taking the acid suppressor [place] he doesn't recall it today. Also, notes from January 2016 state that he was on lisinopril at that time. He does not recall having his medication change though he states clearly that he's not currently taking that medication.  His weight has decreased slightly recently which he attributes to a lack of appetite.  Past Medical History  Diagnosis Date  . Diabetes mellitus     NIDDM  .  Hypertension     under control; has been on med. x 4 yrs.  . Hyperlipidemia   . De Quervain's tenosynovitis, left 03/2011     Family History  Problem Relation Age of Onset  . Cancer Other   . Hypertension Brother   . Hyperlipidemia Other   . Asthma Son   . Lung cancer Maternal Grandfather   . Rheum arthritis Maternal Grandmother      History   Social History  . Marital Status: Married    Spouse Name: N/A  . Number of Children: N/A  . Years of Education: N/A   Occupational History  . Not on file.   Social History Main Topics  . Smoking status: Former Smoker -- 3.00 packs/day for 6 years    Types: Cigarettes    Quit date: 02/05/1999  . Smokeless tobacco: Never Used  . Alcohol Use: No  . Drug Use: No  . Sexual Activity: Not on file   Other Topics Concern  . Not on file   Social History Narrative     Allergies  Allergen Reactions  . Niaspan [Niacin] Rash     Outpatient Prescriptions Prior to Visit  Medication Sig Dispense Refill  . albuterol (PROVENTIL HFA;VENTOLIN HFA) 108 (90 BASE) MCG/ACT inhaler Inhale 2 puffs into the lungs every 6 (six) hours as needed for wheezing or shortness of breath. 1 Inhaler 1  .  atorvastatin (LIPITOR) 40 MG tablet TAKE 1 TABLET BY MOUTH ONCE DAILY 90 tablet 0  . bisoprolol-hydrochlorothiazide (ZIAC) 10-6.25 MG per tablet Take 1 tablet by mouth daily. 90 tablet 0  . Choline Fenofibrate (FENOFIBRIC ACID) 135 MG CPDR TAKE 1 CAPSULE BY MOUTH EVERY EVENING. 90 capsule 0  . fluticasone (FLONASE) 50 MCG/ACT nasal spray Place 2 sprays into both nostrils daily. 16 g 3  . glipiZIDE (GLUCOTROL) 10 MG tablet TAKE 1 TABLET BY MOUTH TWICE DAILY WITH MEALS 180 tablet 0  . glucose blood test strip 1 each by Other route 2 (two) times daily. Use as instructed 100 each 11  . HYDROcodone-homatropine (HYCODAN) 5-1.5 MG/5ML syrup Take 5 mLs by mouth every 8 (eight) hours as needed for cough. 120 mL 0  . metFORMIN (GLUCOPHAGE) 1000 MG tablet TAKE 1 TABLET  BY MOUTH TWICE DAILY WITH MEALS 180 tablet 0  . sitaGLIPtin (JANUVIA) 100 MG tablet Take 1 tablet (100 mg total) by mouth daily. 30 tablet 3  . traMADol (ULTRAM) 50 MG tablet 1 - 2 every 8 hours as needed for pain 60 tablet 0  . TRUEPLUS LANCETS 33G MISC 1 each by Does not apply route 2 (two) times daily. 100 each 11  . azithromycin (ZITHROMAX) 250 MG tablet Day 1: Take 2 daily.  Days 2-5: Take 1 days. 6 tablet 0  . pioglitazone (ACTOS) 45 MG tablet TAKE 1 TABLET BY MOUTH ONCE DAILY (Patient not taking: Reported on 05/27/2014) 90 tablet 0   No facility-administered medications prior to visit.       Review of Systems  Constitutional: Negative for fever and unexpected weight change.  HENT: Positive for congestion, postnasal drip and rhinorrhea. Negative for dental problem, ear pain, nosebleeds, sinus pressure, sneezing, sore throat and trouble swallowing.   Eyes: Negative for redness and itching.  Respiratory: Positive for cough. Negative for chest tightness, shortness of breath and wheezing.   Cardiovascular: Negative for palpitations and leg swelling.  Gastrointestinal: Negative for nausea and vomiting.  Genitourinary: Negative for dysuria.  Musculoskeletal: Negative for joint swelling.  Skin: Negative for rash.  Neurological: Negative for headaches.  Hematological: Does not bruise/bleed easily.  Psychiatric/Behavioral: Negative for dysphoric mood. The patient is not nervous/anxious.        Objective:   Physical Exam Filed Vitals:   05/27/14 1030  BP: 162/64  Pulse: 70  Height: 6\' 2"  (1.88 m)  Weight: 280 lb (127.007 kg)  SpO2: 98%   RA  Gen: overweight but well appearing, no acute distress HENT: NCAT, OP clear, neck supple without masses Eyes: PERRL, EOMi Lymph: no cervical lymphadenopathy PULM: CTA B CV: RRR, no mgr, no JVD GI: BS+, soft, nontender, no hsm Derm: no rash or skin breakdown MSK: normal bulk and tone Neuro: A&Ox4, CN II-XII intact, strength 5/5 in all 4  extremities Psyche: Anxious    11/2013 CXR images personally reviewed> normal cardiac silhouette, normal lung fields without parenchymal abnormality Primary care physician notes from January 2016 reviewed where his cough was treated with treatment for rhinitis     Assessment & Plan:   Chronic cough Matthew Osborne has a chronic cough which is exacerbated by sinus congestion. His sinus congestion has improved somewhat with treatment of allergic rhinitis. I have a high index of suspicion of acid reflux considering his obesity. His chest x-ray was normal, his lungs are clear on exam, and his oxygenation is normal so I do not suspect an underlying lung disease. However, for completeness sake we will obtain full  pulmonary function testing and repeat a chest x-ray today. He is quite anxious about the fact that he smokes cigarettes in the past. I reassured him that there is no evidence of lung cancer based on the chest x-ray.  I also explained to him that laryngeal irritation is likely contributing to his ongoing cough symptoms.  Plan: Voice rest encouraged, Tessalon prescribed to use with voice rest Using antihistamine daily, If zyrtec not helping that I recommended chlorpheniramine Use Flonase daily Start over-the-counter acid suppressor, I recommended Pepcid, he was given education on causes of acid reflux Repeat chest x-ray Full pulmonary function testing If no improvement in 6 weeks then consider enrollment in clinical trial for drug for chronic idiopathic cough   Sinus congestion It sounds as if his sinus congestion is fairly well controlled with Flonase but he still has some persistent symptoms. I explained to him that this is most likely allergic rhinitis and I recommended daily antihistamine use. However, he is quite anxious about the possibility that he may have Wegener's because he has had 2 family members with this condition. He has no other signs or symptoms of Wegener's and so I gave him  reassurance today. However, he would like to be tested.  Plan: Serum ANCA Antihistamine and Flonase as detailed above     Updated Medication List Outpatient Encounter Prescriptions as of 05/27/2014  Medication Sig  . albuterol (PROVENTIL HFA;VENTOLIN HFA) 108 (90 BASE) MCG/ACT inhaler Inhale 2 puffs into the lungs every 6 (six) hours as needed for wheezing or shortness of breath.  [name] atorvastatin (LIPITOR) 40 MG tablet TAKE 1 TABLET BY MOUTH ONCE DAILY  . bisoprolol-hydrochlorothiazide (ZIAC) 10-6.25 MG per tablet Take 1 tablet by mouth daily.  . Choline Fenofibrate (FENOFIBRIC ACID) 135 MG CPDR TAKE 1 CAPSULE BY MOUTH EVERY EVENING.  . fluticasone (FLONASE) 50 MCG/ACT nasal spray Place 2 sprays into both nostrils daily.  [name] glipiZIDE (GLUCOTROL) 10 MG tablet TAKE 1 TABLET BY MOUTH TWICE DAILY WITH MEALS  . glucose blood test strip 1 each by Other route 2 (two) times daily. Use as instructed  . HYDROcodone-homatropine (HYCODAN) 5-1.5 MG/5ML syrup Take 5 mLs by mouth every 8 (eight) hours as needed for cough.  . metFORMIN (GLUCOPHAGE) 1000 MG tablet TAKE 1 TABLET BY MOUTH TWICE DAILY WITH MEALS  . sitaGLIPtin (JANUVIA) 100 MG tablet Take 1 tablet (100 mg total) by mouth daily.  . traMADol (ULTRAM) 50 MG tablet 1 - 2 every 8 hours as needed for pain  . TRUEPLUS LANCETS 33G MISC 1 each by Does not apply route 2 (two) times daily.  . benzonatate (TESSALON) 200 MG capsule Take 1 capsule (200 mg total) by mouth 3 (three) times daily as needed for cough.  . [DISCONTINUED] azithromycin (ZITHROMAX) 250 MG tablet Day 1: Take 2 daily.  Days 2-5: Take 1 days.  . [DISCONTINUED] pioglitazone (ACTOS) 45 MG tablet TAKE 1 TABLET BY MOUTH ONCE DAILY (Patient not taking: Reported on 05/27/2014)

## 2014-05-27 NOTE — Assessment & Plan Note (Signed)
Matthew Osborne has a chronic cough which is exacerbated by sinus congestion. His sinus congestion has improved somewhat with treatment of allergic rhinitis. I have a high index of suspicion of acid reflux considering his obesity. His chest x-ray was normal, his lungs are clear on exam, and his oxygenation is normal so I do not suspect an underlying lung disease. However, for completeness sake we will obtain full pulmonary function testing and repeat a chest x-ray today. He is quite anxious about the fact that he smokes cigarettes in the past. I reassured him that there is no evidence of lung cancer based on the chest x-ray.  I also explained to him that laryngeal irritation is likely contributing to his ongoing cough symptoms.  Plan: Voice rest encouraged, Tessalon prescribed to use with voice rest Using antihistamine daily, If zyrtec not helping that I recommended chlorpheniramine Use Flonase daily Start over-the-counter acid suppressor, I recommended Pepcid, he was given education on causes of acid reflux Repeat chest x-ray Full pulmonary function testing If no improvement in 6 weeks then consider enrollment in clinical trial for drug for chronic idiopathic cough

## 2014-05-27 NOTE — Patient Instructions (Signed)
We will call you with the results of the chest x-ray Take over-the-counter Pepcid twice a day Follow the acid reflux guidelines we provided you Take Zyrtec daily If you continue to have mucus production despite the Zantac and the Flonase and I recommend that you take chlorpheniramine with phenylephrine (this is an over-the-counter decongestant and antihistamine) We will order a pulmonary function test at [facility] need to try to suppress your cough to allow your larynx (voice box) to heal.  For three days don't talk, laugh, sing, or clear your throat. Do everything you can to suppress the cough during this time. Use hard candies (sugarless Jolly Ranchers) or non-mint or non-menthol containing cough drops during this time to soothe your throat.  Use a cough suppressant (the tessalon I have prescribed you) around the clock during this time.  After three days, gradually increase the use of your voice and back off on the cough suppressants. We will see you back in 6 weeks or sooner if needed

## 2014-05-30 LAB — ANCA SCREEN W REFLEX TITER
ATYPICAL P-ANCA SCREEN: NEGATIVE
P-ANCA SCREEN: NEGATIVE
c-ANCA Screen: NEGATIVE

## 2014-06-03 ENCOUNTER — Telehealth: Payer: Self-pay | Admitting: Pulmonary Disease

## 2014-06-03 NOTE — Telephone Encounter (Signed)
Patient notified of lab results. Nothing further needed.  

## 2014-06-08 ENCOUNTER — Ambulatory Visit (HOSPITAL_COMMUNITY): Payer: 59 | Attending: Pulmonary Disease

## 2014-06-14 ENCOUNTER — Other Ambulatory Visit: Payer: Self-pay | Admitting: *Deleted

## 2014-06-14 ENCOUNTER — Encounter: Payer: Self-pay | Admitting: *Deleted

## 2014-06-14 VITALS — BP 138/72 | Ht 75.0 in | Wt 278.0 lb

## 2014-06-14 DIAGNOSIS — E1165 Type 2 diabetes mellitus with hyperglycemia: Secondary | ICD-10-CM

## 2014-06-14 LAB — POCT GLYCOSYLATED HEMOGLOBIN (HGB A1C): HEMOGLOBIN A1C: 10.5

## 2014-06-14 NOTE — Patient Outreach (Signed)
[facility]) Care Management   06/14/2014  Matthew Osborne 09-25-1975 [id number]  Matthew Osborne is an 39 y.o. male who presents for routine [facility] To [facility]ness follow up for self management assistance of Type II DM and HTN and hyperlipidemia.  Subjective:  He states he continues to struggle with taking his medications because he remains very concerned about the potential side effects and long term consequences of taking the medications. He continues to fear he will get cancer because there is a strong family history. 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 Says his daughter, Tod Persia,  fell off her bike two days ago and broke both wrists, knocked out her 2 front permanent teeth and sustained abrasions to her head and face. He continues to eat fast food frequently due to his work schedule.    Objective:   Review of Systems  Constitutional: Negative.     Physical Exam  Constitutional: He is oriented to person, place, and time. He appears well-developed.  Neurological: He is alert and oriented to person, place, and time.  Psychiatric: He has a normal mood and affect. His behavior is normal. Judgment and thought content normal.   Filed Weights   06/14/14 1001  Weight: 278 lb (126.1 kg)   Filed Vitals:   06/14/14 1001  BP: 138/72  Post Prandial POC CBG= 269 POC A1C= 10.5% Current Medications:   Current Outpatient Prescriptions  Medication Sig Dispense Refill  . albuterol (PROVENTIL HFA;VENTOLIN HFA) 108 (90 BASE) MCG/ACT inhaler Inhale 2 puffs into the lungs every 6 (six) hours as needed for wheezing or shortness of breath. 1 Inhaler 1  . atorvastatin (LIPITOR) 40 MG tablet TAKE 1 TABLET BY MOUTH ONCE DAILY 90 tablet 0  . benzonatate (TESSALON) 200 MG capsule Take 1 capsule (200 mg total) by mouth 3 (three) times  daily as needed for cough. (Patient not taking: Reported on 06/14/2014) 30 capsule 1  . bisoprolol-hydrochlorothiazide (ZIAC) 10-6.25 MG per tablet Take 1 tablet by mouth daily. 90 tablet 0  . Choline Fenofibrate (FENOFIBRIC ACID) 135 MG CPDR TAKE 1 CAPSULE BY MOUTH EVERY EVENING. 90 capsule 0  . fluticasone (FLONASE) 50 MCG/ACT nasal spray Place 2 sprays into both nostrils daily. 16 g 3  . glipiZIDE (GLUCOTROL) 10 MG tablet TAKE 1 TABLET BY MOUTH TWICE DAILY WITH MEALS (Patient not taking: Reported on 06/14/2014) 180 tablet 0  . glucose blood test strip 1 each by Other route 2 (two) times daily. Use as instructed 100 each 11  . HYDROcodone-homatropine (HYCODAN) 5-1.5 MG/5ML syrup Take 5 mLs by mouth every 8 (eight) hours as needed for cough. (Patient not taking: Reported on 06/14/2014) 120 mL 0  . metFORMIN (GLUCOPHAGE) 1000 MG tablet TAKE 1 TABLET BY MOUTH TWICE DAILY WITH MEALS 180 tablet 0  . sitaGLIPtin (JANUVIA) 100 MG tablet Take 1 tablet (100 mg total) by mouth daily. 30 tablet 3  . traMADol (ULTRAM) 50 MG tablet 1 - 2 every 8 hours as needed for pain (Patient not taking: Reported on 06/14/2014) 60 tablet 0  . TRUEPLUS LANCETS 33G MISC 1 each by Does not apply route 2 (two) times daily. 100 each 11   No current facility-administered medications for this visit.    Functional Status:   In your present state of health, do you have any difficulty performing the following activities:  06/14/2014  Hearing? N  Vision? N  Difficulty concentrating or making decisions? N  Walking or climbing stairs? N  Dressing or bathing? N  Doing errands, shopping? N    Fall/Depression Screening:    PHQ 2/9 Scores 06/14/2014  PHQ - 2 Score 0   [facility] CM Care Plan Problem One        Patient Outreach from 06/14/2014 in [place] Problem One  Type II DM not meeting a1C target as evidenced by POC A1C= 10.5% on 06/14/14   Care Plan for Problem One  Active   [facility] Long Term Goal (31-90 days)   Improved glycemic control as evidenced by A1C < 10.5% at next Link To Wellness visiti in June due to inreased adherence to glipizide with patient reporting no more than 2 missed doses per week   [facility] Long Term Goal Start Date  06/14/14   Interventions for Problem One Long Term Goal  reviewed basic pathophysiology of Type II DM and core metabolic deficits, discussed DM medications including mechanism of action, common side effects and dosing, encouraged Juanpablo to focus on takiing his glipizide for the next month to improve glycemic control, arranged for Link To Wellness follow up in one month     Assessment:   Link To Wellness member with Type II DM not meeting A1C with increased A1C from last check primarily due to non adherence to DM medications  Plan:  Fax today's note to [place] Meet with Thurmond Butts in one month to assess his medication adherence to [place] RN,CCM,CDE [place] Management Coordinator Link To Wellness Office Phone [phone] Office Fax [phone]

## 2014-06-17 ENCOUNTER — Encounter: Payer: Self-pay | Admitting: Physician Assistant

## 2014-06-17 DIAGNOSIS — J309 Allergic rhinitis, unspecified: Secondary | ICD-10-CM | POA: Insufficient documentation

## 2014-06-24 ENCOUNTER — Encounter (HOSPITAL_COMMUNITY): Payer: 59

## 2014-06-28 ENCOUNTER — Encounter: Payer: Self-pay | Admitting: Family Medicine

## 2014-06-29 ENCOUNTER — Ambulatory Visit (HOSPITAL_COMMUNITY)
Admission: RE | Admit: 2014-06-29 | Discharge: 2014-06-29 | Disposition: A | Discharge status: Home / Self Care | Payer: 59 | Source: Ambulatory Visit | Attending: Pulmonary Disease | Admitting: Pulmonary Disease

## 2014-06-29 DIAGNOSIS — R053 Chronic cough: Secondary | ICD-10-CM

## 2014-06-29 DIAGNOSIS — R05 Cough: Secondary | ICD-10-CM | POA: Diagnosis not present

## 2014-06-29 LAB — PULMONARY FUNCTION TEST
DL/VA % PRED: 114 %
DL/VA: 5.58 ml/min/mmHg/L
DLCO unc % pred: 105 %
DLCO unc: 39.65 ml/min/mmHg
FEF 25-75 POST: 3.37 L/s
FEF 25-75 Pre: 3.74 L/sec
FEF2575-%Change-Post: -9 %
FEF2575-%PRED-POST: 76 %
FEF2575-%PRED-PRE: 84 %
FEV1-%Change-Post: -3 %
FEV1-%Pred-Post: 82 %
FEV1-%Pred-Pre: 85 %
FEV1-POST: 3.93 L
FEV1-Pre: 4.06 L
FEV1FVC-%CHANGE-POST: 3 %
FEV1FVC-%PRED-PRE: 99 %
FEV6-%Change-Post: -5 %
FEV6-%PRED-POST: 81 %
FEV6-%Pred-Pre: 86 %
FEV6-POST: 4.78 L
FEV6-Pre: 5.07 L
FEV6FVC-%CHANGE-POST: 0 %
FEV6FVC-%PRED-PRE: 101 %
FEV6FVC-%Pred-Post: 101 %
FVC-%Change-Post: -5 %
FVC-%PRED-PRE: 84 %
FVC-%Pred-Post: 79 %
FVC-POST: 4.79 L
FVC-PRE: 5.09 L
Post FEV1/FVC ratio: 82 %
Post FEV6/FVC ratio: 100 %
Pre FEV1/FVC ratio: 80 %
Pre FEV6/FVC Ratio: 99 %
RV % PRED: 62 %
RV: 1.27 L
TLC % pred: 88 %
TLC: 6.84 L

## 2014-06-29 MED ORDER — ALBUTEROL SULFATE (2.5 MG/3ML) 0.083% IN NEBU
2.5000 mg | INHALATION_SOLUTION | Freq: Once | RESPIRATORY_TRACT | Status: AC
  Administered 2014-06-29: 2.5 mg via RESPIRATORY_TRACT

## 2014-07-12 ENCOUNTER — Telehealth: Payer: Self-pay | Admitting: Pulmonary Disease

## 2014-07-12 ENCOUNTER — Ambulatory Visit: Payer: 59 | Admitting: Pulmonary Disease

## 2014-07-12 NOTE — Telephone Encounter (Signed)
Patient notified of PFT results. Nothing further needed.  

## 2014-07-14 ENCOUNTER — Ambulatory Visit (INDEPENDENT_AMBULATORY_CARE_PROVIDER_SITE_OTHER): Payer: 59 | Admitting: Pulmonary Disease

## 2014-07-14 ENCOUNTER — Other Ambulatory Visit: Payer: Self-pay | Admitting: *Deleted

## 2014-07-14 ENCOUNTER — Encounter: Payer: Self-pay | Admitting: Pulmonary Disease

## 2014-07-14 VITALS — BP 136/84 | HR 82 | Ht 74.0 in | Wt 274.0 lb

## 2014-07-14 DIAGNOSIS — R05 Cough: Secondary | ICD-10-CM | POA: Diagnosis not present

## 2014-07-14 DIAGNOSIS — R053 Chronic cough: Secondary | ICD-10-CM

## 2014-07-14 NOTE — Progress Notes (Signed)
   Subjective:    Patient ID: Matthew Osborne, male    DOB: 1975-03-17, 39 y.o.   MRN: [id number]  HPI Chief Complaint  Patient presents with  . Follow-up    cough improved but still present.     [name] says that his cough is pretty much gone.   He says that he still has the cough from time to time. He is here to go over his lab work and his pulmonary function.  Past Medical History  Diagnosis Date  . Diabetes mellitus     NIDDM  . Hypertension     under control; has been on med. x 4 yrs.  . Hyperlipidemia   . De Quervain's tenosynovitis, left 03/2011      Review of Systems     Objective:   Physical Exam Filed Vitals:   07/14/14 1632  BP: 136/84  Pulse: 82  Height: 6\' 2"  (1.88 m)  Weight: 274 lb (124.286 kg)  SpO2: 97%          Assessment & Plan:

## 2014-07-14 NOTE — Patient Instructions (Signed)
I recommend the following:  For allergy related sinus congestion: Use Neil Med rinses with distilled water at least twice per day using the instructions on the package. 1/2 hour after using the [facility] Med rinse, use Nasacort two puffs in each nostril once per day.  Remember that the Nasacort can take 1-2 weeks to work after regular use. Use generic zyrtec (cetirizine) every day.  If this doesn't help, then stop taking it and use chlorpheniramine-phenylephrine combination tablets.  For colds leading to sinus congestion: Phenylephrine tabs as directed, pseudofed if symptoms poorly controlled Saline rinses  For cough: Delsym over the counter as directed  For heavy mucus Long acting guaifenesin tablets, 600mg  twice a day  We will see you back as needed

## 2014-07-14 NOTE — Assessment & Plan Note (Signed)
This problem has resolved. It was primarily related to a viral infection and then later perpetuated by allergic rhinitis. With treatment of allergic rhinitis he has had resolution of the cough. His chest x-ray, blood work, and pulmonary function testing was completely normal so there is no evidence of lung disease. Again today on exam his lung exam is normal.  I explained to him that his cough was perpetuated by laryngeal irritation and that the use of over-the-counter medications can help prevent this.   Plan: He was reassured there is no evidence of lung disease I spent an extensive amount time covering various over-the-counter medications which I recommend that he use to treat sinus symptoms and cold symptoms should he develop an upper respiratory infection in the future

## 2014-07-15 NOTE — Patient Outreach (Signed)
[place] [facility]) Care Management   07/14/14  Matthew Osborne 1975-08-16 [id number]  Matthew Osborne is an 39 y.o. male who presents for monthly Link To Wellness follow up for self management assistance with Type II DM, HTN and hyperlipidemia.  Subjective:  He says he and his wife [name] have been drinking a weight loss supplement "Plexus Slim" for the last month. He says it has helped to decrease his cravings for Coke and desserts. He says he is drinking mostly water and an occasional Diet Mt. Dew. 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 He says he remains non adherent to any of his medications as he remains concerned about the potential side effects. He says he will see Dr. Lake Bells, pulmonologist, later today for follow up of chronic cough which has now resolved.  Objective:   Review of Systems  Constitutional: Negative.     Physical Exam  Constitutional: He is oriented to person, place, and time. He appears well-developed and well-nourished.  Neurological: He is alert and oriented to person, place, and time.  Psychiatric: He has a normal mood and affect. His behavior is normal. Judgment and thought content normal.   Filed Weights   07/14/14 1421  Weight: 272 lb 9.6 oz (123.651 kg)   Filed Vitals:   07/14/14 1421  BP: 125/85  POC Post Prandial CBG= 239 POC A1C= 9.6%   Current Medications:   Current Outpatient Prescriptions  Medication Sig Dispense Refill  . Albuterol Sulfate 108 (90 BASE) MCG/ACT AEPB Inhale 2 puffs into the lungs every 4 (four) hours as needed.    [name] atorvastatin (LIPITOR) 40 MG tablet TAKE 1 TABLET BY MOUTH ONCE DAILY 90 tablet 0  . beclomethasone (QVAR) 80 MCG/ACT inhaler Inhale 2 puffs into the lungs 2 (two) times daily.    . benzonatate (TESSALON) 200 MG capsule Take 1 capsule (200 mg total) by mouth 3  (three) times daily as needed for cough. 30 capsule 1  . bisoprolol-hydrochlorothiazide (ZIAC) 10-6.25 MG per tablet Take 1 tablet by mouth daily. 90 tablet 0  . Choline Fenofibrate (FENOFIBRIC ACID) 135 MG CPDR TAKE 1 CAPSULE BY MOUTH EVERY EVENING. 90 capsule 0  . fluticasone (FLONASE) 50 MCG/ACT nasal spray Place 2 sprays into both nostrils daily. 16 g 3  . glipiZIDE (GLUCOTROL) 10 MG tablet TAKE 1 TABLET BY MOUTH TWICE DAILY WITH MEALS 180 tablet 0  . glucose blood test strip 1 each by Other route 2 (two) times daily. Use as instructed 100 each 11  . HYDROcodone-homatropine (HYCODAN) 5-1.5 MG/5ML syrup Take 5 mLs by mouth every 8 (eight) hours as needed for cough. 120 mL 0  . levocetirizine (XYZAL) 5 MG tablet Take 5 mg by mouth every evening.    . metFORMIN (GLUCOPHAGE) 1000 MG tablet TAKE 1 TABLET BY MOUTH TWICE DAILY WITH MEALS 180 tablet 0  . sitaGLIPtin (JANUVIA) 100 MG tablet Take 1 tablet (100 mg total) by mouth daily. 30 tablet 3  . traMADol (ULTRAM) 50 MG tablet 1 - 2 every 8 hours as needed for pain 60 tablet 0  . TRUEPLUS LANCETS 33G MISC 1 each by Does not apply route 2 (two) times daily. 100 each 11   No current facility-administered medications for this visit.    Functional Status:   In your present state of health, do you have any difficulty performing  the following activities: 06/14/2014  Hearing? N  Vision? N  Difficulty concentrating or making decisions? N  Walking or climbing stairs? N  Dressing or bathing? N  Doing errands, shopping? N    Fall/Depression Screening:    PHQ 2/9 Scores 06/14/2014  PHQ - 2 Score 0   THN CM Care Plan Problem One        Patient Outreach from 07/14/2014 in [place] Problem One  Type II DM with improved glycemic control as evidenced by POC A1C= 9.6%   Care Plan for Problem One  Active   THN Long Term Goal (31-90 days)  Ongoing improved glycemic control as evidenced by ongoing improvement in A1C with A1C < 7.0%  by end of 2016   [facility] Long Term Goal Start Date  06/14/14   Interventions for Problem One Long Term Goal  reviewed basic pathophysiology of Type II DM and core metabolic deficits, much positive reinforcement given to [facility] for his weight loss and improvement in A1C, encouraged Voyd to continue to avoid or significantly reduce consumption of sweetened drinks and white starches, arranged for Link To Wellness follow up in one month     Assessment:   Link To Wellness member meeting short term goal of improved A1C but not meeting A1C target of <7.0%.  Plan:  RNCM will meet monthly with patient per Link To Wellness program guidelines to assist with Type II DM self-management and assess patient's progress toward mutually set goals.  Barrington Ellison RN,CCM,CDE [place] Management Coordinator Link To Wellness Office Phone [phone] Office Fax [phone]

## 2014-08-01 ENCOUNTER — Other Ambulatory Visit: Payer: Self-pay

## 2014-08-23 ENCOUNTER — Other Ambulatory Visit: Payer: Self-pay | Admitting: *Deleted

## 2014-08-23 VITALS — BP 125/82 | Ht 75.0 in | Wt 270.4 lb

## 2014-08-23 DIAGNOSIS — E119 Type 2 diabetes mellitus without complications: Secondary | ICD-10-CM

## 2014-08-24 NOTE — Patient Outreach (Signed)
[facility]) Care Management   08/23/2014  ADIB WAHBA 12-03-1975 [id number]  Matthew Osborne is an 39 y.o. male who presents for monthly Link To Wellness follow up for self management assistance with Type II DM.  Subjective:  He states he takes all of his medicines on an average of once week as he continues to fear the side effects, he is especially fearful of developing cancer,  and also says that they decrease his energy and libido.  He continues to drink a Plexus Slim shake every morning and says this helps his craving for carbohydrates. He says he has stopped drinking sweet tea completely and drinks mostly water and an occasional diet soft drink.  Objective:   Review of Systems  Constitutional: Negative.    Filed Vitals:   08/23/14 1608  BP: 125/82   Filed Weights   08/23/14 1608  Weight: 270 lb 6.4 oz (122.653 kg)  POC A1C= 9.3% POC Post Prandial CBG = 281 ( Peanut Butter Cracker and Chicken Sandwich, water to drink)   Physical Exam  Constitutional: He is oriented to person, place, and time. He appears well-developed and well-nourished.  Neurological: He is alert and oriented to person, place, and time.  Skin: Skin is warm and dry.  Psychiatric: He has a normal mood and affect. His behavior is normal. Thought content normal.    Current Medications:   Current Outpatient Prescriptions  Medication Sig Dispense Refill  . atorvastatin (LIPITOR) 40 MG tablet TAKE 1 TABLET BY MOUTH ONCE DAILY 90 tablet 0  . bisoprolol-hydrochlorothiazide (ZIAC) 10-6.25 MG per tablet Take 1 tablet by mouth daily. 90 tablet 0  . Choline Fenofibrate (FENOFIBRIC ACID) 135 MG CPDR TAKE 1 CAPSULE BY MOUTH EVERY EVENING. 90 capsule 0  . glipiZIDE (GLUCOTROL) 10 MG tablet TAKE 1 TABLET BY MOUTH TWICE DAILY WITH MEALS 180 tablet 0  . metFORMIN (GLUCOPHAGE) 1000 MG tablet TAKE 1 TABLET BY MOUTH TWICE DAILY WITH MEALS 180 tablet 0  . sitaGLIPtin (JANUVIA) 100 MG tablet Take 1 tablet (100  mg total) by mouth daily. 30 tablet 3  . Albuterol Sulfate 108 (90 BASE) MCG/ACT AEPB Inhale 2 puffs into the lungs every 4 (four) hours as needed.    . beclomethasone (QVAR) 80 MCG/ACT inhaler Inhale 2 puffs into the lungs 2 (two) times daily.    . benzonatate (TESSALON) 200 MG capsule Take 1 capsule (200 mg total) by mouth 3 (three) times daily as needed for cough. (Patient not taking: Reported on 08/23/2014) 30 capsule 1  . fluticasone (FLONASE) 50 MCG/ACT nasal spray Place 2 sprays into both nostrils daily. (Patient not taking: Reported on 08/23/2014) 16 g 3  . glucose blood test strip 1 each by Other route 2 (two) times daily. Use as instructed (Patient not taking: Reported on 08/23/2014) 100 each 11  . HYDROcodone-homatropine (HYCODAN) 5-1.5 MG/5ML syrup Take 5 mLs by mouth every 8 (eight) hours as needed for cough. 120 mL 0  . levocetirizine (XYZAL) 5 MG tablet Take 5 mg by mouth every evening.    . traMADol (ULTRAM) 50 MG tablet 1 - 2 every 8 hours as needed for pain (Patient not taking: Reported on 08/23/2014) 60 tablet 0  . TRUEPLUS LANCETS 33G MISC 1 each by Does not apply route 2 (two) times daily. (Patient not taking: Reported on 08/23/2014) 100 each 11   No current facility-administered medications for this visit.    Functional Status:   In your present state of health, do you  have any difficulty performing the following activities: 06/14/2014  Hearing? N  Vision? N  Difficulty concentrating or making decisions? N  Walking or climbing stairs? N  Dressing or bathing? N  Doing errands, shopping? N    Fall/Depression Screening:    PHQ 2/9 Scores 06/14/2014  PHQ - 2 Score 0   THN CM Care Plan Problem One        Patient Outreach from 08/23/2014 in [place] Problem One  Type II DM with improved glycemic control as evidenced by POC A1C= 9.3%   THN Long Term Goal (31-90 days)  Ongoing improved glycemic control as evidenced by ongoing improvement in A1C with  A1C < 7.0% by end of 2016   Interventions for Problem One Long Term Goal  reviewed basic pathophysiology of Type II DM and core metabolic deficits, positive reinforcement given to [facility] for his weight loss and slight improvement in A1C, much positive reinforcement given for Ithan's onging avoidance of sweetened tea and sodas, arranged for Link To Wellness follow up in one month      Assessment:   [place] dependent and Link To Wellness member with Type II DM with chronic elevation of A1C since diagnosis in 2010, current POC A1C slightly improved at 9.3%.  Plan:  RNCM will meet quarterly and as needed with patient per Link To Wellness program guidelines to assist with Type II DM self-management and assess patient's progress toward mutually set goals.   Barrington Ellison RN,CCM,CDE [place] Management Coordinator Link To Wellness Office Phone [phone] Office Fax [phone]

## 2014-09-08 ENCOUNTER — Encounter: Payer: Self-pay | Admitting: Family Medicine

## 2014-09-08 ENCOUNTER — Other Ambulatory Visit: Payer: Self-pay | Admitting: Family Medicine

## 2014-09-08 NOTE — Telephone Encounter (Signed)
Medication refill for one time only.  Patient needs to be seen.  Letter sent for patient to call and schedule 

## 2014-09-15 ENCOUNTER — Other Ambulatory Visit: Payer: Self-pay | Admitting: Family Medicine

## 2014-09-15 MED ORDER — FENOFIBRIC ACID 135 MG PO CPDR: DELAYED_RELEASE_CAPSULE | ORAL | Status: DC

## 2014-09-15 MED ORDER — METFORMIN HCL 1000 MG PO TABS: 1000.0000 mg | ORAL_TABLET | Freq: Two times a day (BID) | ORAL | Status: DC

## 2014-09-15 NOTE — Telephone Encounter (Signed)
Medication refilled per protocol. 

## 2014-09-19 ENCOUNTER — Encounter: Payer: Self-pay | Admitting: Family Medicine

## 2014-09-19 ENCOUNTER — Ambulatory Visit (INDEPENDENT_AMBULATORY_CARE_PROVIDER_SITE_OTHER): Payer: 59 | Admitting: Family Medicine

## 2014-09-19 VITALS — BP 140/78 | HR 82 | Temp 97.9°F | Resp 16 | Ht 75.0 in | Wt 265.0 lb

## 2014-09-19 DIAGNOSIS — I10 Essential (primary) hypertension: Secondary | ICD-10-CM | POA: Diagnosis not present

## 2014-09-19 DIAGNOSIS — E785 Hyperlipidemia, unspecified: Secondary | ICD-10-CM | POA: Diagnosis not present

## 2014-09-19 DIAGNOSIS — E1165 Type 2 diabetes mellitus with hyperglycemia: Secondary | ICD-10-CM | POA: Diagnosis not present

## 2014-09-19 LAB — LIPID PANEL
CHOL/HDL RATIO: 5.9 ratio — AB (ref <=5.0)
Cholesterol: 124 mg/dL — ABNORMAL LOW (ref 125–200)
HDL: 21 mg/dL — AB (ref >=40)
LDL CALC: 73 mg/dL (ref <130)
TRIGLYCERIDES: 152 mg/dL — AB (ref <150)
VLDL: 30 mg/dL (ref <30)

## 2014-09-19 LAB — COMPLETE METABOLIC PANEL WITH GFR
ALBUMIN: 4 g/dL (ref 3.6–5.1)
ALT: 31 U/L (ref 9–46)
AST: 21 U/L (ref 10–40)
Alkaline Phosphatase: 57 U/L (ref 40–115)
BILIRUBIN TOTAL: 0.4 mg/dL (ref 0.2–1.2)
BUN: 10 mg/dL (ref 7–25)
CO2: 27 mmol/L (ref 20–31)
Calcium: 9.4 mg/dL (ref 8.6–10.3)
Chloride: 100 mmol/L (ref 98–110)
Creat: 0.94 mg/dL (ref 0.60–1.35)
GFR, Est African American: >89 mL/min (ref >=60)
GFR, Est Non African American: >89 mL/min (ref >=60)
GLUCOSE: 198 mg/dL — AB (ref 70–99)
Potassium: 4.3 mmol/L (ref 3.5–5.3)
SODIUM: 141 mmol/L (ref 135–146)
Total Protein: 7.2 g/dL (ref 6.1–8.1)

## 2014-09-19 NOTE — Progress Notes (Signed)
Subjective:    Patient ID: Matthew Osborne, male    DOB: 1975/08/07, 39 y.o.   MRN: [id number]  HPI Patient is here today for follow-up of his diabetes, dyslipidemia, and hypertension. He admits that he is missing approximately 4-5 doses of his medication per week. Primarily the problem seems to be taking his twice a day drug such as the glipizide and metformin. He states that his average blood sugar is around 180 although he is frequently having hypoglycemic episodes in the 50s and 70s. This particularly occurs when he takes his glipizide if he doesn't eat significantly. Otherwise he denies any chest pain shortness of breath or dyspnea on exertion Past Medical History  Diagnosis Date  . Diabetes mellitus     NIDDM  . Hypertension     under control; has been on med. x 4 yrs.  . Hyperlipidemia   . De Quervain's tenosynovitis, left 03/2011   Past Surgical History  Procedure Laterality Date  . Carpal tunnel release      bilat.  . Tonsillectomy and adenoidectomy  as a child  . Vasectomy    . Dorsal compartment release  03/14/2011    Procedure: RELEASE DORSAL COMPARTMENT (DEQUERVAIN);  Surgeon: Paulene Floor, MD;  Location: [place];  Service: Orthopedics;  Laterality: Left;  left wrist APL and EPL tenosynovectomy and 1st dorsal compartment release   Current Outpatient Prescriptions on File Prior to Visit  Medication Sig Dispense Refill  . Albuterol Sulfate 108 (90 BASE) MCG/ACT AEPB Inhale 2 puffs into the lungs every 4 (four) hours as needed.    [name] atorvastatin (LIPITOR) 40 MG tablet TAKE 1 TABLET BY MOUTH ONCE DAILY 90 tablet 0  . beclomethasone (QVAR) 80 MCG/ACT inhaler Inhale 2 puffs into the lungs 2 (two) times daily.    . benzonatate (TESSALON) 200 MG capsule Take 1 capsule (200 mg total) by mouth 3 (three) times daily as needed for cough. 30 capsule 1  . bisoprolol-hydrochlorothiazide (ZIAC) 10-6.25 MG per tablet TAKE 1 TABLET BY MOUTH ONCE DAILY IN THE MORNING 30  tablet 0  . Choline Fenofibrate (FENOFIBRIC ACID) 135 MG CPDR TAKE 1 CAPSULE BY MOUTH EVERY EVENING. 30 capsule 0  . fluticasone (FLONASE) 50 MCG/ACT nasal spray Place 2 sprays into both nostrils daily. 16 g 3  . glipiZIDE (GLUCOTROL) 10 MG tablet TAKE 1 TABLET BY MOUTH TWICE DAILY WITH MEALS 180 tablet 0  . glucose blood test strip 1 each by Other route 2 (two) times daily. Use as instructed 100 each 11  . HYDROcodone-homatropine (HYCODAN) 5-1.5 MG/5ML syrup Take 5 mLs by mouth every 8 (eight) hours as needed for cough. 120 mL 0  . levocetirizine (XYZAL) 5 MG tablet Take 5 mg by mouth every evening.    . metFORMIN (GLUCOPHAGE) 1000 MG tablet Take 1 tablet (1,000 mg total) by mouth 2 (two) times daily with a meal. 60 tablet 0  . sitaGLIPtin (JANUVIA) 100 MG tablet Take 1 tablet (100 mg total) by mouth daily. 30 tablet 3  . traMADol (ULTRAM) 50 MG tablet 1 - 2 every 8 hours as needed for pain 60 tablet 0  . TRUEPLUS LANCETS 33G MISC 1 each by Does not apply route 2 (two) times daily. 100 each 11   No current facility-administered medications on file prior to visit.   Allergies  Allergen Reactions  . Niaspan [Niacin] Rash   Social History   Social History  . Marital Status: Married    Spouse Name:  N/A  . Number of Children: N/A  . Years of Education: N/A   Occupational History  . Not on file.   Social History Main Topics  . Smoking status: Former Smoker -- 3.00 packs/day for 6 years    Types: Cigarettes    Quit date: 02/05/1999  . Smokeless tobacco: Never Used  . Alcohol Use: No  . Drug Use: No  . Sexual Activity: Not on file   Other Topics Concern  . Not on file   Social History Narrative     Review of Systems      Objective:   Physical Exam  Constitutional: He appears well-developed and well-nourished.  Cardiovascular: Normal rate, regular rhythm and normal heart sounds.   Pulmonary/Chest: Effort normal and breath sounds normal. No respiratory distress. He has no  wheezes. He has no rales. He exhibits no tenderness.  Abdominal: Soft. Bowel sounds are normal. He exhibits no distension and no mass. There is no tenderness. There is no rebound and no guarding.  Musculoskeletal: He exhibits no edema.  Vitals reviewed.         Assessment & Plan:  Essential hypertension, benign  Type 2 diabetes mellitus with hyperglycemia - Plan: COMPLETE METABOLIC PANEL WITH GFR, Lipid panel, Hemoglobin A1c, Microalbumin, urine  Hyperlipidemia  compliance seems to be the biggest issue. If the patient's hemoglobin A1c is elevated, I would discontinue glipizide, I would switch metformin from twice daily to once daily metformin XR.  I would replace glipizide with invokana.  I will also check a fasting lipid panel. Goal LDL cholesterol is less than 100. I will also check a urine microalbumin.

## 2014-09-20 ENCOUNTER — Other Ambulatory Visit: Payer: Self-pay | Admitting: *Deleted

## 2014-09-20 DIAGNOSIS — E1165 Type 2 diabetes mellitus with hyperglycemia: Secondary | ICD-10-CM

## 2014-09-20 DIAGNOSIS — IMO0002 Reserved for concepts with insufficient information to code with codable children: Secondary | ICD-10-CM

## 2014-09-20 LAB — MICROALBUMIN, URINE: Microalb, Ur: 0.6 mg/dL (ref <2.0)

## 2014-09-20 LAB — HEMOGLOBIN A1C
Hgb A1c MFr Bld: 9.3 % — ABNORMAL HIGH (ref <5.7)
Mean Plasma Glucose: 220 mg/dL — ABNORMAL HIGH (ref <117)

## 2014-09-20 MED ORDER — EMPAGLIFLOZIN-LINAGLIPTIN 25-5 MG PO TABS: 1.0000 | ORAL_TABLET | Freq: Every day | ORAL | Status: DC

## 2014-09-20 MED ORDER — GLIPIZIDE ER 10 MG PO TB24: 10.0000 mg | ORAL_TABLET | Freq: Every day | ORAL | Status: DC

## 2014-09-21 ENCOUNTER — Other Ambulatory Visit: Payer: Self-pay | Admitting: *Deleted

## 2014-09-21 ENCOUNTER — Ambulatory Visit: Payer: 59 | Admitting: *Deleted

## 2014-09-21 NOTE — Patient Outreach (Signed)
Zackaria was a no show for his 10:00 am appointment today. He is being seen monthly in the Link To Wellness program for Type II DM self management assistance. Left message on his mobile number requesting he call Arville Care, [facility] CM assistant, to reschedule his appointment. Also reminded him that our office has moved to [place]. Barrington Ellison RN,CCM,CDE [place] Management Coordinator Link To Wellness Office Phone [phone] Office Fax [phone]

## 2014-10-18 ENCOUNTER — Other Ambulatory Visit: Payer: Self-pay | Admitting: *Deleted

## 2014-10-18 ENCOUNTER — Ambulatory Visit: Payer: 59 | Admitting: *Deleted

## 2014-10-18 VITALS — BP 120/78 | Ht 75.0 in | Wt 270.0 lb

## 2014-10-18 DIAGNOSIS — E11638 Type 2 diabetes mellitus with other oral complications: Secondary | ICD-10-CM

## 2014-10-18 LAB — POCT GLYCOSYLATED HEMOGLOBIN (HGB A1C): HEMOGLOBIN A1C: 7.5

## 2014-10-20 NOTE — Addendum Note (Signed)
Addended by: Barrington Ellison on: 10/20/2014 11:03 AM   Modules accepted: Orders

## 2014-10-20 NOTE — Patient Outreach (Signed)
[place] [facility]) Care Management   10/18/2014  Matthew Osborne 1975-11-14 [id number]  ASCENCION COYE is an 39 y.o. male who presents for monthly Link To Wellness follow up.  Subjective:  Matthew Osborne says he has been consistently taking his medications and checking his blood sugar and using a phone app "Azumio" to track his blood sugars. He says he was motivated to make significant behavior changes after he saw his dentist and was diagnosed with a tooth abscess and was told it was due to his chronically elevated blood sugar. Says he saw Dr. Dennard Schaumann on 8/15 and his medication regimen was changed because his A1C was 9.3%. He is requesting a POC A1C be checked because he feels it will be much better   Objective:   Review of Systems  Constitutional: Negative.     Physical Exam  Constitutional: He is oriented to person, place, and time. He appears well-developed and well-nourished.  Neurological: He is alert and oriented to person, place, and time.  Skin: Skin is warm and dry.  Psychiatric: He has a normal mood and affect. His behavior is normal. Judgment and thought content normal.   Filed Vitals:   10/18/14 0740  BP: 120/78   Filed Weights   10/18/14 0740  Weight: 270 lb (122.471 kg)  POC A1C= 7.5% POC CBG= 104  Outpatient Encounter Prescriptions as of 10/18/2014  Medication Sig Note  . Albuterol Sulfate 108 (90 BASE) MCG/ACT AEPB Inhale 2 puffs into the lungs every 4 (four) hours as needed. 10/18/2014: Uses only prn- hasn't needed in last 4-5 months  . atorvastatin (LIPITOR) 40 MG tablet TAKE 1 TABLET BY MOUTH ONCE DAILY 08/23/2014: Takes once a week  . bisoprolol-hydrochlorothiazide (ZIAC) 10-6.25 MG per tablet TAKE 1 TABLET BY MOUTH ONCE DAILY IN THE MORNING   . Choline Fenofibrate (FENOFIBRIC ACID) 135 MG CPDR TAKE 1 CAPSULE BY MOUTH EVERY EVENING.   . Empagliflozin-Linagliptin (GLYXAMBI) 25-5 MG TABS Take 1 tablet by mouth daily.   . fluticasone (FLONASE) 50 MCG/ACT nasal spray  Place 2 sprays into both nostrils daily. 10/18/2014: Taking as needed  . glipiZIDE (GLIPIZIDE XL) 10 MG 24 hr tablet Take 1 tablet (10 mg total) by mouth daily with breakfast.   . glucose blood test strip 1 each by Other route 2 (two) times daily. Use as instructed 06/14/2014: When he remembers - rarely takes  . penicillin v potassium (VEETID) 500 MG tablet Take 500 mg by mouth 4 (four) times daily.   . sitaGLIPtin (JANUVIA) 100 MG tablet Take 1 tablet (100 mg total) by mouth daily. 08/23/2014: Takes once a week  . TRUEPLUS LANCETS 33G MISC 1 each by Does not apply route 2 (two) times daily.   . beclomethasone (QVAR) 80 MCG/ACT inhaler Inhale 2 puffs into the lungs 2 (two) times daily.   . benzonatate (TESSALON) 200 MG capsule Take 1 capsule (200 mg total) by mouth 3 (three) times daily as needed for cough. (Patient not taking: Reported on 10/18/2014)   . HYDROcodone-homatropine (HYCODAN) 5-1.5 MG/5ML syrup Take 5 mLs by mouth every 8 (eight) hours as needed for cough. (Patient not taking: Reported on 10/18/2014)   . levocetirizine (XYZAL) 5 MG tablet Take 5 mg by mouth every evening.   . metFORMIN (GLUCOPHAGE) 1000 MG tablet Take 1 tablet (1,000 mg total) by mouth 2 (two) times daily with a meal. (Patient not taking: Reported on 10/18/2014)   . traMADol (ULTRAM) 50 MG tablet 1 - 2 every 8 hours as needed  for pain (Patient not taking: Reported on 10/18/2014)    No Osborne-administered encounter medications on file as of 10/18/2014.      Functional Status:   In your present state of health, do you have any difficulty performing the following activities: 10/18/2014 06/14/2014  Hearing? N N  Vision? N N  Difficulty concentrating or making decisions? - N  Walking or climbing stairs? - N  Dressing or bathing? N N  Doing errands, shopping? N N    Fall/Depression Screening:    PHQ 2/9 Scores 06/14/2014  PHQ - 2 Score 0    Assessment:   Spouse of Matthew Osborne and Link To Wellness member with  Type II DM, HTN and hyperlipidemia; he has made numerous healthy behavior changes resulting in a significant improvement in glycemic control as evidenced by POC A1C= 7.5% today.  Plan:  [facility] CM Care Plan Problem One        Most Recent Value   Care Plan Problem One  Type II DM with significantly improved glycemic control as evidenced by POC A1C= 7.5% on 10/17/14   Role Documenting the Problem One  Care Management [place] for Problem One  Active   THN Long Term Goal (31-90 days)  Ongoing improved glycemic control as evidenced by ongoing improvement in A1C with A1C < 7.0% by end of 2016   [facility] Long Term Goal Start Date  10/18/14   Interventions for Problem One Long Term Goal  much positive reinforcement given to Matthew Osborne for his many healthy lifestyle changes resulting in signficantly improved glycemic control, reviewed basic pathophysiology of Type II DM and core metabolic deficits, encouraged Matthew Osborne to avoid or significantly reduce consumption of sweetened drinks and white starches, arranged for Link To Wellness follow up in one month     RNCM to fax today's office visit note to Dr. Dennard Schaumann. RNCM will meet monthly and as needed with patient per Link To Wellness program guidelines to assist with Type II DM self-management and assess patient's progress toward mutually set goals.  Barrington Ellison RN,CCM,CDE [place] Management Coordinator Link To Wellness Office Phone [phone] Office Fax [phone]

## 2014-11-02 ENCOUNTER — Other Ambulatory Visit: Payer: Self-pay | Admitting: Physician Assistant

## 2014-11-02 ENCOUNTER — Other Ambulatory Visit: Payer: Self-pay | Admitting: Family Medicine

## 2014-11-02 ENCOUNTER — Encounter: Payer: Self-pay | Admitting: Family Medicine

## 2014-11-02 DIAGNOSIS — E1165 Type 2 diabetes mellitus with hyperglycemia: Secondary | ICD-10-CM

## 2014-11-02 DIAGNOSIS — IMO0002 Reserved for concepts with insufficient information to code with codable children: Secondary | ICD-10-CM

## 2014-11-02 MED ORDER — ATORVASTATIN CALCIUM 40 MG PO TABS: 40.0000 mg | ORAL_TABLET | Freq: Every day | ORAL | Status: DC

## 2014-11-02 MED ORDER — FENOFIBRIC ACID 135 MG PO CPDR: DELAYED_RELEASE_CAPSULE | ORAL | Status: DC

## 2014-11-02 NOTE — Addendum Note (Signed)
Addended by: Sheral Flow on: 11/02/2014 02:32 PM   Modules accepted: Orders, Medications

## 2014-11-03 MED ORDER — GLUCOSE BLOOD VI STRP: ORAL_STRIP | Status: DC

## 2014-11-03 MED ORDER — BLOOD GLUCOSE MONITOR KIT: PACK | Status: DC

## 2014-11-03 NOTE — Telephone Encounter (Signed)
Medication refilled per protocol. 

## 2014-11-03 NOTE — Telephone Encounter (Signed)
Diabetic supplies refilled 

## 2014-11-22 ENCOUNTER — Ambulatory Visit: Payer: 59 | Admitting: *Deleted

## 2014-12-20 ENCOUNTER — Ambulatory Visit: Payer: Self-pay | Admitting: Family Medicine

## 2014-12-26 ENCOUNTER — Encounter: Payer: Self-pay | Admitting: Family Medicine

## 2014-12-26 ENCOUNTER — Ambulatory Visit (INDEPENDENT_AMBULATORY_CARE_PROVIDER_SITE_OTHER): Payer: 59 | Admitting: Family Medicine

## 2014-12-26 VITALS — BP 128/82 | HR 72 | Temp 98.6°F | Resp 18 | Ht 73.5 in | Wt 264.0 lb

## 2014-12-26 DIAGNOSIS — E785 Hyperlipidemia, unspecified: Secondary | ICD-10-CM | POA: Diagnosis not present

## 2014-12-26 DIAGNOSIS — E1165 Type 2 diabetes mellitus with hyperglycemia: Secondary | ICD-10-CM | POA: Diagnosis not present

## 2014-12-26 DIAGNOSIS — I10 Essential (primary) hypertension: Secondary | ICD-10-CM | POA: Diagnosis not present

## 2014-12-26 NOTE — Progress Notes (Signed)
Subjective:    Patient ID: Matthew Osborne, male    DOB: 07-21-1975, 39 y.o.   MRN: [id number]  HPI 09/19/14 Patient is here today for follow-up of his diabetes, dyslipidemia, and hypertension. He admits that he is missing approximately 4-5 doses of his medication per week. Primarily the problem seems to be taking his twice a day drug such as the glipizide and metformin. He states that his average blood sugar is around 180 although he is frequently having hypoglycemic episodes in the 50s and 70s. This particularly occurs when he takes his glipizide if he doesn't eat significantly. Otherwise he denies any chest pain shortness of breath or dyspnea on exertion.  At that time, my plan was:  compliance seems to be the biggest issue. If the patient's hemoglobin A1c is elevated, I would discontinue glipizide, I would switch metformin from twice daily to once daily metformin XR.  I would replace glipizide with invokana.  I will also check a fasting lipid panel. Goal LDL cholesterol is less than 100. I will also check a urine microalbumin.   12/26/14 Patient is here today for follow-up. His last hemoglobin A1c was greater than 9. Since that time, the patient has discontinued drinking all soft drinks. He is also lost substantial weight. His two-hour postprandial sugars are ranging 140-190 the typically around 150-160. He seldom checks his fasting blood sugars. He denies any hypoglycemia. Unfortunately he is confused and taking Januvia and glyxambi.   Past Medical History  Diagnosis Date  . Diabetes mellitus     NIDDM  . Hypertension     under control; has been on med. x 4 yrs.  . Hyperlipidemia   . De Quervain's tenosynovitis, left 03/2011   Past Surgical History  Procedure Laterality Date  . Carpal tunnel release      bilat.  . Tonsillectomy and adenoidectomy  as a child  . Vasectomy    . Dorsal compartment release  03/14/2011    Procedure: RELEASE DORSAL COMPARTMENT (DEQUERVAIN);  Surgeon: Paulene Floor, MD;  Location: [facility];  Service: Orthopedics;  Laterality: Left;  left wrist APL and EPL tenosynovectomy and 1st dorsal compartment release   Current Outpatient Prescriptions on File Prior to Visit  Medication Sig Dispense Refill  . Albuterol Sulfate 108 (90 BASE) MCG/ACT AEPB Inhale 2 puffs into the lungs every 4 (four) hours as needed.    [name] atorvastatin (LIPITOR) 40 MG tablet Take 1 tablet (40 mg total) by mouth daily. 90 tablet 0  . beclomethasone (QVAR) 80 MCG/ACT inhaler Inhale 2 puffs into the lungs 2 (two) times daily.    . bisoprolol-hydrochlorothiazide (ZIAC) 10-6.25 MG per tablet TAKE 1 TABLET BY MOUTH ONCE DAILY IN THE MORNING 30 tablet 0  . blood glucose meter kit and supplies KIT Dispense based on patient and insurance preference. Use twice daily times daily as directed. (FOR ICD-10  E11.65) 1 each 0  . Choline Fenofibrate (FENOFIBRIC ACID) 135 MG CPDR TAKE 1 CAPSULE BY MOUTH EVERY EVENING. 90 capsule 0  . Empagliflozin-Linagliptin (GLYXAMBI) 25-5 MG TABS Take 1 tablet by mouth daily. 30 tablet 3  . fluticasone (FLONASE) 50 MCG/ACT nasal spray Place 2 sprays into both nostrils daily. 16 g 3  . glipiZIDE (GLIPIZIDE XL) 10 MG 24 hr tablet Take 1 tablet (10 mg total) by mouth daily with breakfast. 30 tablet 3  . glucose blood test strip Dispense based on patient and insurance preference.  Use twice daily as directed. (FOR ICD-10  E11.65) 100 each 11  . HYDROcodone-homatropine (HYCODAN) 5-1.5 MG/5ML syrup Take 5 mLs by mouth every 8 (eight) hours as needed for cough. 120 mL 0  . JANUVIA 100 MG tablet TAKE 1 TABLET BY MOUTH ONCE DAILY 30 tablet 1  . levocetirizine (XYZAL) 5 MG tablet Take 5 mg by mouth every evening.    . metFORMIN (GLUCOPHAGE) 1000 MG tablet Take 1 tablet (1,000 mg total) by mouth 2 (two) times daily with a meal. 60 tablet 0  . traMADol (ULTRAM) 50 MG tablet 1 - 2 every 8 hours as needed for pain 60 tablet 0  . TRUEPLUS LANCETS 33G MISC 1  each by Does not apply route 2 (two) times daily. 100 each 11   No current facility-administered medications on file prior to visit.   Allergies  Allergen Reactions  . Niaspan [Niacin] Rash   Social History   Social History  . Marital Status: Married    Spouse Name: N/A  . Number of Children: N/A  . Years of Education: N/A   Occupational History  . Not on file.   Social History Main Topics  . Smoking status: Former Smoker -- 3.00 packs/day for 6 years    Types: Cigarettes    Quit date: 02/05/1999  . Smokeless tobacco: Never Used  . Alcohol Use: No  . Drug Use: No  . Sexual Activity: Not on file   Other Topics Concern  . Not on file   Social History Narrative     Review of Systems  All other systems reviewed and are negative.       Objective:   Physical Exam  Constitutional: He appears well-developed and well-nourished.  Cardiovascular: Normal rate, regular rhythm and normal heart sounds.   Pulmonary/Chest: Effort normal and breath sounds normal. No respiratory distress. He has no wheezes. He has no rales. He exhibits no tenderness.  Abdominal: Soft. Bowel sounds are normal. He exhibits no distension and no mass. There is no tenderness. There is no rebound and no guarding.  Musculoskeletal: He exhibits no edema.  Vitals reviewed.         Assessment & Plan:  Type 2 diabetes mellitus with hyperglycemia, without long-term current use of insulin (HCC) - Plan: CBC with Differential/Platelet, COMPLETE METABOLIC PANEL WITH GFR, Lipid panel, Hemoglobin A1c, Microalbumin, urine  Hyperlipidemia  Essential hypertension, benign  I am very happy with the patient's weight loss. I recommended he start an aspirin 81 mg by mouth daily. I would like him to return fasting for fasting lipid panel, and hemoglobin A1c. If his hemoglobin A1c is in the 7 range, I think he can achieve his blood sugar goals by losing an additional 5-10 pounds and increasing his aerobic exercise. If  his hemoglobin A1c is greater than 8, I would add Victoza. I recommended that he stop Januvia and continue glyxambi.  I offered the patient a flu shot today but he declined

## 2014-12-27 ENCOUNTER — Ambulatory Visit (INDEPENDENT_AMBULATORY_CARE_PROVIDER_SITE_OTHER): Payer: 59 | Admitting: Family Medicine

## 2014-12-27 ENCOUNTER — Other Ambulatory Visit: Payer: 59

## 2014-12-27 DIAGNOSIS — Z23 Encounter for immunization: Secondary | ICD-10-CM | POA: Diagnosis not present

## 2014-12-27 LAB — CBC WITH DIFFERENTIAL/PLATELET
BASOS ABS: 0.1 10*3/uL (ref 0.0–0.1)
Basophils Relative: 1 % (ref 0–1)
EOS ABS: 0.2 10*3/uL (ref 0.0–0.7)
EOS PCT: 3 % (ref 0–5)
HCT: 48.9 % (ref 39.0–52.0)
Hemoglobin: 16.4 g/dL (ref 13.0–17.0)
LYMPHS ABS: 2 10*3/uL (ref 0.7–4.0)
Lymphocytes Relative: 30 % (ref 12–46)
MCH: 30.4 pg (ref 26.0–34.0)
MCHC: 33.5 g/dL (ref 30.0–36.0)
MCV: 90.6 fL (ref 78.0–100.0)
MPV: 9.4 fL (ref 8.6–12.4)
Monocytes Absolute: 0.6 10*3/uL (ref 0.1–1.0)
Monocytes Relative: 9 % (ref 3–12)
Neutro Abs: 3.7 10*3/uL (ref 1.7–7.7)
Neutrophils Relative %: 57 % (ref 43–77)
PLATELETS: 284 10*3/uL (ref 150–400)
RBC: 5.4 MIL/uL (ref 4.22–5.81)
RDW: 13.1 % (ref 11.5–15.5)
WBC: 6.5 10*3/uL (ref 4.0–10.5)

## 2014-12-27 LAB — COMPLETE METABOLIC PANEL WITH GFR
ALT: 38 U/L (ref 9–46)
AST: 26 U/L (ref 10–40)
Albumin: 4.2 g/dL (ref 3.6–5.1)
Alkaline Phosphatase: 60 U/L (ref 40–115)
BUN: 14 mg/dL (ref 7–25)
CHLORIDE: 102 mmol/L (ref 98–110)
CO2: 25 mmol/L (ref 20–31)
Calcium: 9.1 mg/dL (ref 8.6–10.3)
Creat: 1.12 mg/dL (ref 0.60–1.35)
GFR, EST NON AFRICAN AMERICAN: 82 mL/min (ref >=60)
Glucose, Bld: 109 mg/dL — ABNORMAL HIGH (ref 70–99)
POTASSIUM: 4.5 mmol/L (ref 3.5–5.3)
Sodium: 137 mmol/L (ref 135–146)
Total Bilirubin: 0.7 mg/dL (ref 0.2–1.2)
Total Protein: 7.1 g/dL (ref 6.1–8.1)

## 2014-12-27 LAB — LIPID PANEL
CHOL/HDL RATIO: 5.2 ratio — AB (ref <=5.0)
Cholesterol: 120 mg/dL — ABNORMAL LOW (ref 125–200)
HDL: 23 mg/dL — AB (ref >=40)
LDL CALC: 75 mg/dL (ref <130)
TRIGLYCERIDES: 108 mg/dL (ref <150)
VLDL: 22 mg/dL (ref <30)

## 2014-12-27 LAB — HEMOGLOBIN A1C
Hgb A1c MFr Bld: 7.1 % — ABNORMAL HIGH (ref <5.7)
Mean Plasma Glucose: 157 mg/dL — ABNORMAL HIGH (ref <117)

## 2014-12-28 LAB — MICROALBUMIN, URINE: Microalb, Ur: 0.4 mg/dL

## 2015-01-05 ENCOUNTER — Ambulatory Visit: Payer: 59 | Admitting: Family Medicine

## 2015-01-06 ENCOUNTER — Other Ambulatory Visit: Payer: Self-pay | Admitting: Family Medicine

## 2015-01-06 NOTE — Telephone Encounter (Signed)
Refill appropriate and filled per protocol. 

## 2015-03-01 ENCOUNTER — Telehealth: Payer: Self-pay

## 2015-03-09 ENCOUNTER — Other Ambulatory Visit: Payer: Self-pay | Admitting: Family Medicine

## 2015-03-09 ENCOUNTER — Encounter: Payer: Self-pay | Admitting: Family Medicine

## 2015-03-09 MED FILL — METFORMIN HCL ER 1,000 MG T: 1000 | 30 days supply | Qty: 60 | Fill #3

## 2015-03-09 MED FILL — glipiZIDE ER 10 MG TB24: 10 | 30 days supply | Qty: 30 | Fill #3

## 2015-03-09 NOTE — Telephone Encounter (Signed)
Medication refilled per protocol. 

## 2015-03-21 MED FILL — JANUVIA 100 MG TABLET: 100 | 90 days supply | Qty: 90 | Fill #0

## 2015-03-22 MED ORDER — EMPAGLIFLOZIN 25 MG PO TABS: 25.0000 mg | ORAL_TABLET | Freq: Every day | ORAL | Status: DC

## 2015-03-22 MED ORDER — CANAGLIFLOZIN 300 MG PO TABS
300.0000 mg | ORAL_TABLET | Freq: Every day | ORAL | Status: DC
Start: 2015-03-22 — End: 2015-12-15

## 2015-03-22 NOTE — Telephone Encounter (Signed)
Insurance will not cover invokana but will cover jardiance - rx sent to pharm and pt aware via mychart

## 2015-03-22 NOTE — Addendum Note (Signed)
Addended by: Shary Decamp B on: 03/22/2015 03:43 PM   Modules accepted: Orders

## 2015-03-29 ENCOUNTER — Telehealth: Payer: Self-pay

## 2015-04-03 ENCOUNTER — Encounter: Payer: Self-pay | Admitting: *Deleted

## 2015-04-03 NOTE — Patient Outreach (Signed)
Patient told care management assistant when she called him to arrange [facility] follow up on 03/29/15 that he no longer has [employer] so he is no longer eligible for the [employer]. Will notify his primary care providers via letter and close case to disease management services. Barrington Ellison RN,CCM,CDE [facility] Management Coordinator [facility] Office Phone [phone] Office Fax [phone]

## 2015-04-20 ENCOUNTER — Other Ambulatory Visit: Payer: Self-pay | Admitting: Family Medicine

## 2015-04-20 MED ORDER — BISOPROLOL-HYDROCHLOROTHIAZIDE 10-6.25 MG PO TABS: 1.0000 | ORAL_TABLET | Freq: Every day | ORAL | Status: DC

## 2015-04-20 MED FILL — BISOPROLOL/HCTZ 10/6.25 TAB: 10-6.25 | 90 days supply | Qty: 90 | Fill #0

## 2015-04-20 MED FILL — METFORMIN HCL ER 1,000 MG T: 1000 | 90 days supply | Qty: 180 | Fill #0

## 2015-04-20 MED FILL — JARDIANCE 25 MG TABLET: 25 | 90 days supply | Qty: 90 | Fill #0

## 2015-04-20 NOTE — Telephone Encounter (Signed)
Medication refilled per protocol. 

## 2015-04-21 ENCOUNTER — Encounter: Payer: Self-pay | Admitting: Family Medicine

## 2015-04-21 ENCOUNTER — Other Ambulatory Visit: Payer: Self-pay | Admitting: Family Medicine

## 2015-04-21 DIAGNOSIS — K921 Melena: Secondary | ICD-10-CM

## 2015-04-21 MED FILL — glipiZIDE ER 10 MG TB24: 10 | 90 days supply | Qty: 90 | Fill #0

## 2015-04-24 ENCOUNTER — Encounter: Payer: Self-pay | Admitting: Internal Medicine

## 2015-05-10 ENCOUNTER — Ambulatory Visit: Payer: 59 | Admitting: Gastroenterology

## 2015-05-16 ENCOUNTER — Ambulatory Visit (INDEPENDENT_AMBULATORY_CARE_PROVIDER_SITE_OTHER): Payer: BC Managed Care – PPO | Admitting: Family Medicine

## 2015-05-16 ENCOUNTER — Encounter: Payer: Self-pay | Admitting: Family Medicine

## 2015-05-16 VITALS — BP 128/82 | HR 82 | Temp 98.0°F | Resp 18 | Ht 73.5 in | Wt 269.0 lb

## 2015-05-16 DIAGNOSIS — E785 Hyperlipidemia, unspecified: Secondary | ICD-10-CM | POA: Diagnosis not present

## 2015-05-16 DIAGNOSIS — I10 Essential (primary) hypertension: Secondary | ICD-10-CM

## 2015-05-16 DIAGNOSIS — E1100 Type 2 diabetes mellitus with hyperosmolarity without nonketotic hyperglycemic-hyperosmolar coma (NKHHC): Secondary | ICD-10-CM | POA: Diagnosis not present

## 2015-05-16 LAB — COMPLETE METABOLIC PANEL WITH GFR
ALT: 51 U/L — AB (ref 9–46)
AST: 32 U/L (ref 10–40)
Albumin: 4.3 g/dL (ref 3.6–5.1)
Alkaline Phosphatase: 53 U/L (ref 40–115)
BILIRUBIN TOTAL: 0.7 mg/dL (ref 0.2–1.2)
BUN: 12 mg/dL (ref 7–25)
CALCIUM: 9 mg/dL (ref 8.6–10.3)
CHLORIDE: 103 mmol/L (ref 98–110)
CO2: 24 mmol/L (ref 20–31)
CREATININE: 0.82 mg/dL (ref 0.60–1.35)
GFR, Est Non African American: >89 mL/min (ref >=60)
Glucose, Bld: 137 mg/dL — ABNORMAL HIGH (ref 70–99)
Potassium: 4.5 mmol/L (ref 3.5–5.3)
Sodium: 137 mmol/L (ref 135–146)
TOTAL PROTEIN: 7.4 g/dL (ref 6.1–8.1)

## 2015-05-16 LAB — LIPID PANEL
Cholesterol: 117 mg/dL — ABNORMAL LOW (ref 125–200)
HDL: 24 mg/dL — ABNORMAL LOW (ref >=40)
LDL CALC: 69 mg/dL (ref <130)
Total CHOL/HDL Ratio: 4.9 Ratio (ref <=5.0)
Triglycerides: 121 mg/dL (ref <150)
VLDL: 24 mg/dL (ref <30)

## 2015-05-16 LAB — CBC WITH DIFFERENTIAL/PLATELET
BASOS PCT: 1 %
Basophils Absolute: 57 cells/uL (ref 0–200)
EOS ABS: 228 {cells}/uL (ref 15–500)
Eosinophils Relative: 4 %
HEMATOCRIT: 49.1 % (ref 38.5–50.0)
Hemoglobin: 16.7 g/dL (ref 13.0–17.0)
LYMPHS PCT: 36 %
Lymphs Abs: 2052 cells/uL (ref 850–3900)
MCH: 30.6 pg (ref 27.0–33.0)
MCHC: 34 g/dL (ref 32.0–36.0)
MCV: 90.1 fL (ref 80.0–100.0)
MONO ABS: 456 {cells}/uL (ref 200–950)
MPV: 9.3 fL (ref 7.5–12.5)
Monocytes Relative: 8 %
NEUTROS ABS: 2907 {cells}/uL (ref 1500–7800)
Neutrophils Relative %: 51 %
PLATELETS: 255 10*3/uL (ref 140–400)
RBC: 5.45 MIL/uL (ref 4.20–5.80)
RDW: 13.5 % (ref 11.0–15.0)
WBC: 5.7 10*3/uL (ref 3.8–10.8)

## 2015-05-16 LAB — HEMOGLOBIN A1C
HEMOGLOBIN A1C: 8.1 % — AB (ref <5.7)
MEAN PLASMA GLUCOSE: 186 mg/dL

## 2015-05-16 LAB — TSH: TSH: 0.93 mIU/L (ref 0.40–4.50)

## 2015-05-16 NOTE — Progress Notes (Signed)
Subjective:    Patient ID: Matthew Osborne, male    DOB: [birth year], 40 y.o.   MRN: [id number]  HPI 09/19/14 Patient is here today for follow-up of his diabetes, dyslipidemia, and hypertension. He admits that he is missing approximately 4-5 doses of his medication per week. Primarily the problem seems to be taking his twice a day drug such as the glipizide and metformin. He states that his average blood sugar is around 180 although he is frequently having hypoglycemic episodes in the 50s and 70s. This particularly occurs when he takes his glipizide if he doesn't eat significantly. Otherwise he denies any chest pain shortness of breath or dyspnea on exertion.  At that time, my plan was:  compliance seems to be the biggest issue. If the patient's hemoglobin A1c is elevated, I would discontinue glipizide, I would switch metformin from twice daily to once daily metformin XR.  I would replace glipizide with invokana.  I will also check a fasting lipid panel. Goal LDL cholesterol is less than 100. I will also check a urine microalbumin.   12/26/14 Patient is here today for follow-up. His last hemoglobin A1c was greater than 9. Since that time, the patient has discontinued drinking all soft drinks. He is also lost substantial weight. His two-hour postprandial sugars are ranging 140-190 the typically around 150-160. He seldom checks his fasting blood sugars. He denies any hypoglycemia. Unfortunately he is confused and taking Januvia and glyxambi.  At that time, my plan was:  I am very happy with the patient's weight loss. I recommended he start an aspirin 81 mg by mouth daily. I would like him to return fasting for fasting lipid panel, and hemoglobin A1c. If his hemoglobin A1c is in the 7 range, I think he can achieve his blood sugar goals by losing an additional 5-10 pounds and increasing his aerobic exercise. If his hemoglobin A1c is greater than 8, I would add Victoza. I recommended that he stop Januvia and  continue glyxambi.  I offered the patient a flu shot today but he declined  05/16/15 Patient still is missing 2 out of 7 days of his medicine during the week.  He attributes this to working and just forgetting to take the medication. Although he is doing better than he was, he is still missing approximately 20% of his medication. He denies any hypoglycemic episodes. His fasting blood sugars in the morning are ranging between 140 and 150. He is not checking his blood sugars in the evening. He denies any polyuria, polydipsia, or blurred vision. He continues to lose weight. He also expresses cold intolerance. His blood pressures well controlled at 128/82. He is on Ziac. We have discussed switching him to an ACE inhibitor or an angiotensin receptor blocker for renal protection secondary to his diabetes mellitus however the patient in a fist with a beta blocker and that it helps control his heart rate and mitigate some of his anxiety and therefore he does not want to switch this medication at the present time Past Medical History  Diagnosis Date  . Diabetes mellitus     NIDDM  . Hypertension     under control; has been on med. x 4 yrs.  . Hyperlipidemia   . De Quervain's tenosynovitis, left 03/2011   Past Surgical History  Procedure Laterality Date  . Carpal tunnel release      bilat.  . Tonsillectomy and adenoidectomy  as a child  . Vasectomy    . Dorsal compartment release  03/14/2011    Procedure: RELEASE DORSAL COMPARTMENT (DEQUERVAIN);  Surgeon: Paulene Floor, MD;  Location: [facility];  Service: Orthopedics;  Laterality: Left;  left wrist APL and EPL tenosynovectomy and 1st dorsal compartment release   Current Outpatient Prescriptions on File Prior to Visit  Medication Sig Dispense Refill  . atorvastatin (LIPITOR) 40 MG tablet Take 1 tablet (40 mg total) by mouth daily. 90 tablet 0  . bisoprolol-hydrochlorothiazide (ZIAC) 10-6.25 MG tablet TAKE 1 TABLET BY MOUTH EVERY  MORNING *NEED OFFICE VISIT FOR MORE REFILLS* 90 tablet 0  . blood glucose meter kit and supplies KIT Dispense based on patient and insurance preference. Use twice daily times daily as directed. (FOR ICD-10  E11.65) 1 each 0  . canagliflozin (INVOKANA) 300 MG TABS tablet Take 1 tablet (300 mg total) by mouth daily before breakfast. 90 tablet 1  . Choline Fenofibrate (FENOFIBRIC ACID) 135 MG CPDR TAKE 1 CAPSULE BY MOUTH EVERY EVENING. PATIENT NEEDS APPOINTMENT BEFORE REFILLS 90 capsule 0  . fluticasone (FLONASE) 50 MCG/ACT nasal spray Place 2 sprays into both nostrils daily. 16 g 3  . glipiZIDE (GLUCOTROL XL) 10 MG 24 hr tablet TAKE 1 TABLET BY MOUTH ONCE DAILY 90 tablet 0  . glucose blood test strip Dispense based on patient and insurance preference.  Use twice daily as directed. (FOR ICD-10 E11.65) 100 each 11  . JANUVIA 100 MG tablet TAKE 1 TABLET BY MOUTH ONCE DAILY. PATIENT NEEDS APPOINTMENT FOR REFILLS 90 tablet 1  . metformin (FORTAMET) 1000 MG (OSM) 24 hr tablet TAKE 2 TABLETS BY MOUTH DAILY 180 tablet 0  . TRUEPLUS LANCETS 33G MISC 1 each by Does not apply route 2 (two) times daily. 100 each 11   No current facility-administered medications on file prior to visit.   Allergies  Allergen Reactions  . Niaspan [Niacin] Rash   Social History   Social History  . Marital Status: Married    Spouse Name: N/A  . Number of Children: N/A  . Years of Education: N/A   Occupational History  . Not on file.   Social History Main Topics  . Smoking status: Former Smoker -- 3.00 packs/day for 6 years    Types: Cigarettes    Quit date: 02/05/1999  . Smokeless tobacco: Never Used  . Alcohol Use: No  . Drug Use: No  . Sexual Activity: Not on file   Other Topics Concern  . Not on file   Social History Narrative     Review of Systems  All other systems reviewed and are negative.       Objective:   Physical Exam  Constitutional: He appears well-developed and well-nourished.    Cardiovascular: Normal rate, regular rhythm and normal heart sounds.   Pulmonary/Chest: Effort normal and breath sounds normal. No respiratory distress. He has no wheezes. He has no rales. He exhibits no tenderness.  Abdominal: Soft. Bowel sounds are normal. He exhibits no distension and no mass. There is no tenderness. There is no rebound and no guarding.  Musculoskeletal: He exhibits no edema.  Vitals reviewed.         Assessment & Plan:  Uncontrolled type 2 diabetes mellitus with hyperosmolarity without coma, without long-term current use of insulin (HCC) - Plan: CBC with Differential/Platelet, COMPLETE METABOLIC PANEL WITH GFR, Lipid panel, Microalbumin, urine, TSH, Hemoglobin A1c  Hyperlipidemia  Essential hypertension, benign Blood pressures well controlled..  I will check a hemoglobin A1c. Goal hemoglobin A1c is less than 6.5. We discussed  strategies to try to help him be consistent with taking his medication. I will also check a fasting lipid panel. Goal LDL cholesterol is less than 100. Goal triglycerides are less than 150. Goal HDL cholesterol is greater than 40. Patient is interested in possibly taking fish oil to help with his cholesterol if necessary. I will also check a urine microalbumin and if elevated I would consider discontinuing hydrochlorothiazide and starting the patient on an ACE inhibitor.  His weight change and cold intolerance I will also check a thyroid test however I believe this likely due to his rapid weight change.

## 2015-05-17 LAB — MICROALBUMIN, URINE: Microalb, Ur: 0.5 mg/dL

## 2015-05-22 ENCOUNTER — Encounter: Payer: Self-pay | Admitting: *Deleted

## 2015-05-22 ENCOUNTER — Telehealth: Payer: Self-pay | Admitting: Family Medicine

## 2015-05-29 ENCOUNTER — Ambulatory Visit: Payer: Self-pay | Admitting: Family Medicine

## 2015-06-30 ENCOUNTER — Other Ambulatory Visit: Payer: Self-pay | Admitting: Family Medicine

## 2015-06-30 ENCOUNTER — Encounter: Payer: Self-pay | Admitting: Family Medicine

## 2015-06-30 ENCOUNTER — Ambulatory Visit (INDEPENDENT_AMBULATORY_CARE_PROVIDER_SITE_OTHER): Payer: BC Managed Care – PPO | Admitting: Family Medicine

## 2015-06-30 VITALS — BP 138/90 | HR 84 | Temp 98.2°F | Resp 16 | Ht 73.5 in | Wt 265.0 lb

## 2015-06-30 DIAGNOSIS — W57XXXA Bitten or stung by nonvenomous insect and other nonvenomous arthropods, initial encounter: Secondary | ICD-10-CM | POA: Diagnosis not present

## 2015-06-30 DIAGNOSIS — T148 Other injury of unspecified body region: Secondary | ICD-10-CM

## 2015-06-30 MED ORDER — DOXYCYCLINE HYCLATE 100 MG PO TABS: 100.0000 mg | ORAL_TABLET | Freq: Two times a day (BID) | ORAL | Status: DC

## 2015-06-30 MED FILL — JANUVIA 100 MG TABLET: 100 | 90 days supply | Qty: 90 | Fill #1

## 2015-06-30 MED FILL — DOXYCYCLINE HYCLATE 100 MG: 100 | 10 days supply | Qty: 20 | Fill #0

## 2015-06-30 NOTE — Telephone Encounter (Signed)
Refill appropriate and filled per protocol. 

## 2015-06-30 NOTE — Progress Notes (Signed)
Subjective:    Patient ID: Matthew Osborne, male    DOB: 08/27/1975, 40 y.o.   MRN: [id number]  HPI Patient was bitten by a tick 2 days ago. He now reports fever to 100/101. He also reports a dull constant headache, neck stiffness, diffuse body aches. He does have some mild loose stool and an occasional cough. Exam today is unremarkable Past Medical History  Diagnosis Date  . Diabetes mellitus     NIDDM  . Hypertension     under control; has been on med. x 4 yrs.  . Hyperlipidemia   . De Quervain's tenosynovitis, left 03/2011   Past Surgical History  Procedure Laterality Date  . Carpal tunnel release      bilat.  . Tonsillectomy and adenoidectomy  as a child  . Vasectomy    . Dorsal compartment release  03/14/2011    Procedure: RELEASE DORSAL COMPARTMENT (DEQUERVAIN);  Surgeon: Paulene Floor, MD;  Location: [facility];  Service: Orthopedics;  Laterality: Left;  left wrist APL and EPL tenosynovectomy and 1st dorsal compartment release   Current Outpatient Prescriptions on File Prior to Visit  Medication Sig Dispense Refill  . atorvastatin (LIPITOR) 40 MG tablet Take 1 tablet (40 mg total) by mouth daily. 90 tablet 0  . bisoprolol-hydrochlorothiazide (ZIAC) 10-6.25 MG tablet TAKE 1 TABLET BY MOUTH EVERY MORNING *NEED OFFICE VISIT FOR MORE REFILLS* 90 tablet 0  . blood glucose meter kit and supplies KIT Dispense based on patient and insurance preference. Use twice daily times daily as directed. (FOR ICD-10  E11.65) 1 each 0  . canagliflozin (INVOKANA) 300 MG TABS tablet Take 1 tablet (300 mg total) by mouth daily before breakfast. 90 tablet 1  . Choline Fenofibrate (FENOFIBRIC ACID) 135 MG CPDR TAKE 1 CAPSULE BY MOUTH EVERY EVENING. PATIENT NEEDS APPOINTMENT BEFORE REFILLS 90 capsule 0  . fluticasone (FLONASE) 50 MCG/ACT nasal spray Place 2 sprays into both nostrils daily. 16 g 3  . glipiZIDE (GLUCOTROL XL) 10 MG 24 hr tablet TAKE 1 TABLET BY MOUTH ONCE DAILY 90 tablet  0  . glucose blood test strip Dispense based on patient and insurance preference.  Use twice daily as directed. (FOR ICD-10 E11.65) 100 each 11  . JANUVIA 100 MG tablet TAKE 1 TABLET BY MOUTH ONCE DAILY. PATIENT NEEDS APPOINTMENT FOR REFILLS 90 tablet 1  . metformin (FORTAMET) 1000 MG (OSM) 24 hr tablet TAKE 2 TABLETS BY MOUTH DAILY 180 tablet 0  . TRUEPLUS LANCETS 33G MISC 1 each by Does not apply route 2 (two) times daily. 100 each 11   No current facility-administered medications on file prior to visit.   Allergies  Allergen Reactions  . Niaspan [Niacin] Rash   Social History   Social History  . Marital Status: Married    Spouse Name: N/A  . Number of Children: N/A  . Years of Education: N/A   Occupational History  . Not on file.   Social History Main Topics  . Smoking status: Former Smoker -- 3.00 packs/day for 6 years    Types: Cigarettes    Quit date: 02/05/1999  . Smokeless tobacco: Never Used  . Alcohol Use: No  . Drug Use: No  . Sexual Activity: Not on file   Other Topics Concern  . Not on file   Social History Narrative      Review of Systems  All other systems reviewed and are negative.      Objective:   Physical Exam  Constitutional: He is oriented to person, place, and time. He appears well-developed and well-nourished. No distress.  HENT:  Right Ear: External ear normal.  Left Ear: External ear normal.  Mouth/Throat: Oropharynx is clear and moist. No oropharyngeal exudate.  Eyes: Conjunctivae are normal. No scleral icterus.  Neck: Neck supple.  Cardiovascular: Normal rate, regular rhythm and normal heart sounds.  Exam reveals no gallop and no friction rub.   No murmur heard. Pulmonary/Chest: Effort normal and breath sounds normal. No respiratory distress. He has no wheezes. He has no rales.  Abdominal: Soft. Bowel sounds are normal. He exhibits no distension and no mass. There is no tenderness. There is no rebound and no guarding.    Musculoskeletal: Normal range of motion.  Lymphadenopathy:    He has no cervical adenopathy.  Neurological: He is alert and oriented to person, place, and time. He has normal reflexes. He displays normal reflexes. No cranial nerve deficit. He exhibits normal muscle tone. Coordination normal.  Skin: No rash noted. He is not diaphoretic. No erythema.  Vitals reviewed.         Assessment & Plan:  Tick bite - Plan: doxycycline (VIBRA-TABS) 100 MG tablet  Most likely, the patient has a viral syndrome given the cough and the diarrhea in the diffuse body aches. However I cannot exclude [employer] spotted fever given the recent tick bite fever body aches and headache. I gave the patient prescription for doxycycline 100 mg by mouth twice a day for 10 days. I recommended he start the medication immediately if the symptoms worsen.  They're gradually getting better over the next 24 hours I recommended he not start the medication. We will monitor clinically for the next 24 hours to see if the situation will declare itself

## 2015-09-06 ENCOUNTER — Other Ambulatory Visit: Payer: Self-pay | Admitting: Family Medicine

## 2015-09-06 MED FILL — JARDIANCE 25 MG TABLET: 25 | 90 days supply | Qty: 90 | Fill #1

## 2015-09-06 MED FILL — ATORVASTATIN 40 MG TABLET: 40 | 90 days supply | Qty: 90 | Fill #0

## 2015-09-06 MED FILL — PENICILLIN VK 500 MG TABLET: 500 | 7 days supply | Qty: 28 | Fill #0

## 2015-09-06 MED FILL — glipiZIDE XL 10 MG TB24: 10 | 90 days supply | Qty: 90 | Fill #0

## 2015-09-06 MED FILL — BISOPROLOL-HCTZ 10-6.25 MG: 10-6.25 | 90 days supply | Qty: 90 | Fill #0

## 2015-09-06 NOTE — Telephone Encounter (Signed)
Refill appropriate and filled per protocol. 

## 2015-09-13 ENCOUNTER — Other Ambulatory Visit: Payer: Self-pay | Admitting: Family Medicine

## 2015-09-13 MED ORDER — METFORMIN HCL ER (OSM) 1000 MG PO TB24: 2000.0000 mg | ORAL_TABLET | Freq: Every day | ORAL | 1 refills | Status: DC

## 2015-09-13 NOTE — Telephone Encounter (Signed)
Medication called/sent to requested pharmacy  

## 2015-09-21 ENCOUNTER — Encounter: Payer: Self-pay | Admitting: Family Medicine

## 2015-09-21 ENCOUNTER — Other Ambulatory Visit: Payer: Self-pay | Admitting: Family Medicine

## 2015-09-21 ENCOUNTER — Telehealth: Payer: Self-pay | Admitting: *Deleted

## 2015-09-21 DIAGNOSIS — IMO0001 Reserved for inherently not codable concepts without codable children: Secondary | ICD-10-CM

## 2015-09-21 DIAGNOSIS — IMO0002 Reserved for concepts with insufficient information to code with codable children: Secondary | ICD-10-CM

## 2015-09-21 DIAGNOSIS — E1165 Type 2 diabetes mellitus with hyperglycemia: Secondary | ICD-10-CM

## 2015-09-21 MED ORDER — ATORVASTATIN CALCIUM 40 MG PO TABS: ORAL_TABLET | ORAL | 0 refills | Status: DC

## 2015-09-21 MED ORDER — GLIPIZIDE ER 10 MG PO TB24: ORAL_TABLET | ORAL | 0 refills | Status: DC

## 2015-09-21 MED ORDER — BISOPROLOL-HYDROCHLOROTHIAZIDE 10-6.25 MG PO TABS: ORAL_TABLET | ORAL | 0 refills | Status: DC

## 2015-09-21 MED ORDER — BLOOD GLUCOSE MONITOR KIT: PACK | 0 refills | Status: DC

## 2015-09-21 MED ORDER — FENOFIBRIC ACID 135 MG PO CPDR
DELAYED_RELEASE_CAPSULE | ORAL | 0 refills | Status: DC
Start: 2015-09-21 — End: 2016-12-04

## 2015-09-21 NOTE — Telephone Encounter (Signed)
Received request from pharmacy for PA on Metformin.   PA submitted.   Dx: E11.9.

## 2015-11-24 ENCOUNTER — Telehealth: Payer: Self-pay | Admitting: Family Medicine

## 2015-11-24 ENCOUNTER — Telehealth: Payer: BC Managed Care – PPO | Admitting: Family

## 2015-11-24 DIAGNOSIS — B9689 Other specified bacterial agents as the cause of diseases classified elsewhere: Secondary | ICD-10-CM

## 2015-11-24 DIAGNOSIS — J028 Acute pharyngitis due to other specified organisms: Secondary | ICD-10-CM

## 2015-11-24 MED ORDER — BENZONATATE 100 MG PO CAPS: 100.0000 mg | ORAL_CAPSULE | Freq: Three times a day (TID) | ORAL | 0 refills | Status: DC | PRN

## 2015-11-24 MED ORDER — AZITHROMYCIN 250 MG PO TABS: ORAL_TABLET | ORAL | 0 refills | Status: DC

## 2015-11-24 NOTE — Telephone Encounter (Signed)
Pt made aware of provider recommendations 

## 2015-11-24 NOTE — Telephone Encounter (Signed)
Sounds like a cold.  Recommend he give it some time to avoid abx resistance.  Call us back if no better by Mon and I will call out abx if no better but I believe it just needs time.

## 2015-11-24 NOTE — Telephone Encounter (Signed)
Pt is sick and working in [place] today.  Very congested, sore throat, headache, fever.  Sneezing, cough.  Grayish brown secretions.  Unable to get here today.  Can you please call something in for him? Asking for Z-pak.

## 2015-11-24 NOTE — Progress Notes (Signed)

## 2015-12-15 ENCOUNTER — Ambulatory Visit (INDEPENDENT_AMBULATORY_CARE_PROVIDER_SITE_OTHER): Payer: BC Managed Care – PPO | Admitting: Family Medicine

## 2015-12-15 ENCOUNTER — Encounter: Payer: Self-pay | Admitting: Family Medicine

## 2015-12-15 VITALS — BP 136/90 | HR 78 | Temp 98.1°F | Resp 16 | Ht 73.5 in | Wt 267.0 lb

## 2015-12-15 DIAGNOSIS — Z23 Encounter for immunization: Secondary | ICD-10-CM | POA: Diagnosis not present

## 2015-12-15 DIAGNOSIS — E118 Type 2 diabetes mellitus with unspecified complications: Secondary | ICD-10-CM | POA: Diagnosis not present

## 2015-12-15 DIAGNOSIS — I10 Essential (primary) hypertension: Secondary | ICD-10-CM | POA: Diagnosis not present

## 2015-12-15 DIAGNOSIS — E784 Other hyperlipidemia: Secondary | ICD-10-CM

## 2015-12-15 DIAGNOSIS — E7849 Other hyperlipidemia: Secondary | ICD-10-CM

## 2015-12-15 LAB — COMPREHENSIVE METABOLIC PANEL
ALBUMIN: 4.2 g/dL (ref 3.6–5.1)
ALT: 38 U/L (ref 9–46)
AST: 30 U/L (ref 10–40)
Alkaline Phosphatase: 66 U/L (ref 40–115)
BILIRUBIN TOTAL: 0.8 mg/dL (ref 0.2–1.2)
BUN: 9 mg/dL (ref 7–25)
CALCIUM: 8.8 mg/dL (ref 8.6–10.3)
CHLORIDE: 103 mmol/L (ref 98–110)
CO2: 25 mmol/L (ref 20–31)
Creat: 0.77 mg/dL (ref 0.60–1.35)
Glucose, Bld: 207 mg/dL — ABNORMAL HIGH (ref 70–99)
Potassium: 4.2 mmol/L (ref 3.5–5.3)
Sodium: 138 mmol/L (ref 135–146)
Total Protein: 7.3 g/dL (ref 6.1–8.1)

## 2015-12-15 LAB — LIPID PANEL
CHOL/HDL RATIO: 5.9 ratio — AB (ref <5.0)
CHOLESTEROL: 141 mg/dL (ref <200)
HDL: 24 mg/dL — ABNORMAL LOW (ref >40)
LDL Cholesterol: 88 mg/dL (ref <100)
Triglycerides: 144 mg/dL (ref <150)
VLDL: 29 mg/dL (ref <30)

## 2015-12-15 MED ORDER — GLIPIZIDE ER 10 MG PO TB24: ORAL_TABLET | ORAL | 1 refills | Status: DC

## 2015-12-15 MED ORDER — ATORVASTATIN CALCIUM 40 MG PO TABS: ORAL_TABLET | ORAL | 0 refills | Status: DC

## 2015-12-15 MED ORDER — BISOPROLOL-HYDROCHLOROTHIAZIDE 10-6.25 MG PO TABS: ORAL_TABLET | ORAL | 0 refills | Status: DC

## 2015-12-15 MED ORDER — CANAGLIFLOZIN 300 MG PO TABS: 300.0000 mg | ORAL_TABLET | Freq: Every day | ORAL | 1 refills | Status: DC

## 2015-12-15 MED ORDER — SITAGLIPTIN PHOSPHATE 100 MG PO TABS: ORAL_TABLET | ORAL | 1 refills | Status: DC

## 2015-12-15 MED ORDER — METFORMIN HCL ER (OSM) 1000 MG PO TB24: 2000.0000 mg | ORAL_TABLET | Freq: Every day | ORAL | 1 refills | Status: DC

## 2015-12-15 NOTE — Progress Notes (Signed)
Subjective:    Patient ID: Matthew Osborne, male    DOB: December 15, 1975, 40 y.o.   MRN: [id number]  HPI8/15/16 Patient is here today for follow-up of his diabetes, dyslipidemia, and hypertension. He admits that he is missing approximately 4-5 doses of his medication per week. Primarily the problem seems to be taking his twice a day drug such as the glipizide and metformin. He states that his average blood sugar is around 180 although he is frequently having hypoglycemic episodes in the 50s and 70s. This particularly occurs when he takes his glipizide if he doesn't eat significantly. Otherwise he denies any chest pain shortness of breath or dyspnea on exertion.  At that time, my plan was:  compliance seems to be the biggest issue. If the patient's hemoglobin A1c is elevated, I would discontinue glipizide, I would switch metformin from twice daily to once daily metformin XR.  I would replace glipizide with invokana.  I will also check a fasting lipid panel. Goal LDL cholesterol is less than 100. I will also check a urine microalbumin.   12/26/14 Patient is here today for follow-up. His last hemoglobin A1c was greater than 9. Since that time, the patient has discontinued drinking all soft drinks. He is also lost substantial weight. His two-hour postprandial sugars are ranging 140-190 the typically around 150-160. He seldom checks his fasting blood sugars. He denies any hypoglycemia. Unfortunately he is confused and taking Januvia and glyxambi.  At that time, my plan was:  I am very happy with the patient's weight loss. I recommended he start an aspirin 81 mg by mouth daily. I would like him to return fasting for fasting lipid panel, and hemoglobin A1c. If his hemoglobin A1c is in the 7 range, I think he can achieve his blood sugar goals by losing an additional 5-10 pounds and increasing his aerobic exercise. If his hemoglobin A1c is greater than 8, I would add Victoza. I recommended that he stop Januvia and  continue glyxambi.  I offered the patient a flu shot today but he declined  05/16/15 Patient still is missing 2 out of 7 days of his medicine during the week.  He attributes this to working and just forgetting to take the medication. Although he is doing better than he was, he is still missing approximately 20% of his medication. He denies any hypoglycemic episodes. His fasting blood sugars in the morning are ranging between 140 and 150. He is not checking his blood sugars in the evening. He denies any polyuria, polydipsia, or blurred vision. He continues to lose weight. He also expresses cold intolerance. His blood pressures well controlled at 128/82. He is on Ziac. We have discussed switching him to an ACE inhibitor or an angiotensin receptor blocker for renal protection secondary to his diabetes mellitus however the patient in a fist with a beta blocker and that it helps control his heart rate and mitigate some of his anxiety and therefore he does not want to switch this medication at the present time.  At that time, my plan was: Blood pressures well controlled..  I will check a hemoglobin A1c. Goal hemoglobin A1c is less than 6.5. We discussed strategies to try to help him be consistent with taking his medication. I will also check a fasting lipid panel. Goal LDL cholesterol is less than 100. Goal triglycerides are less than 150. Goal HDL cholesterol is greater than 40. Patient is interested in possibly taking fish oil to help with his cholesterol if necessary.  I will also check a urine microalbumin and if elevated I would consider discontinuing hydrochlorothiazide and starting the patient on an ACE inhibitor.  His weight change and cold intolerance I will also check a thyroid test however I believe this likely due to his rapid weight change.  12/15/15 Patient is now on a combination of metformin, glipizide, Invokana, and Januvia. He is not checking his blood sugars regularly but when he does check his  blood sugars they can range anywhere from 130-160 in the mornings. He denies any polyuria, polydipsia, or blurred vision. He denies any chest pain or shortness of breath or dyspnea on exertion. He denies any myalgias or right upper quadrant pain. His blood pressure today is borderline at 136/90. His weight today is stable at 267. He has not lost any additional weight but he has not regained the weight that he had lost previously. He is due for his flu shot. Past Medical History:  Diagnosis Date  . De Quervain's tenosynovitis, left 03/2011  . Diabetes mellitus    NIDDM  . Hyperlipidemia   . Hypertension    under control; has been on med. x 4 yrs.   Past Surgical History:  Procedure Laterality Date  . CARPAL TUNNEL RELEASE     bilat.  . DORSAL COMPARTMENT RELEASE  03/14/2011   Procedure: RELEASE DORSAL COMPARTMENT (DEQUERVAIN);  Surgeon: Paulene Floor, MD;  Location: [facility];  Service: Orthopedics;  Laterality: Left;  left wrist APL and EPL tenosynovectomy and 1st dorsal compartment release  . TONSILLECTOMY AND ADENOIDECTOMY  as a child  . VASECTOMY     Current Outpatient Prescriptions on File Prior to Visit  Medication Sig Dispense Refill  . atorvastatin (LIPITOR) 40 MG tablet TAKE 1 TABLET BY MOUTH DAILY. PATIENT NEEDS APPOINTMENT FOR FURTHER REFILLS 90 tablet 0  . benzonatate (TESSALON PERLES) 100 MG capsule Take 1-2 capsules (100-200 mg total) by mouth every 8 (eight) hours as needed. 30 capsule 0  . bisoprolol-hydrochlorothiazide (ZIAC) 10-6.25 MG tablet TAKE 1 TABLET BY MOUTH EVERY MORNING *NEED OFFICE VISIT FOR MORE REFILLS* 90 tablet 0  . blood glucose meter kit and supplies KIT Dispense based on patient and insurance preference. Use twice daily times daily as directed. (FOR ICD-10  E11.65) 1 each 0  . canagliflozin (INVOKANA) 300 MG TABS tablet Take 1 tablet (300 mg total) by mouth daily before breakfast. 90 tablet 1  . Choline Fenofibrate (FENOFIBRIC ACID) 135  MG CPDR TAKE 1 CAPSULE BY MOUTH EVERY EVENING. PATIENT NEEDS APPOINTMENT BEFORE REFILLS 90 capsule 0  . doxycycline (VIBRA-TABS) 100 MG tablet Take 1 tablet (100 mg total) by mouth 2 (two) times daily. 20 tablet 0  . fluticasone (FLONASE) 50 MCG/ACT nasal spray Place 2 sprays into both nostrils daily. 16 g 3  . glipiZIDE (GLUCOTROL XL) 10 MG 24 hr tablet TAKE 1 TABLET BY MOUTH ONCE DAILY (NEED APPT BEFORE ANY FURTHER REFILLS) 90 tablet 0  . glucose blood test strip Dispense based on patient and insurance preference.  Use twice daily as directed. (FOR ICD-10 E11.65) 100 each 11  . JANUVIA 100 MG tablet TAKE 1 TABLET BY MOUTH ONCE DAILY. PATIENT NEEDS APPOINTMENT FOR REFILLS 90 tablet 1  . metformin (FORTAMET) 1000 MG (OSM) 24 hr tablet Take 2 tablets (2,000 mg total) by mouth daily with breakfast. 180 tablet 1  . TRUEPLUS LANCETS 33G MISC 1 each by Does not apply route 2 (two) times daily. 100 each 11   No current facility-administered  medications on file prior to visit.    Allergies  Allergen Reactions  . Niaspan [Niacin] Rash   Social History   Social History  . Marital status: Married    Spouse name: N/A  . Number of children: N/A  . Years of education: N/A   Occupational History  . Not on file.   Social History Main Topics  . Smoking status: Former Smoker    Packs/day: 3.00    Years: 6.00    Types: Cigarettes    Quit date: 02/05/1999  . Smokeless tobacco: Never Used  . Alcohol use No  . Drug use: No  . Sexual activity: Not on file   Other Topics Concern  . Not on file   Social History Narrative  . No narrative on file     Review of Systems  All other systems reviewed and are negative.       Objective:   Physical Exam  Constitutional: He appears well-developed and well-nourished.  Cardiovascular: Normal rate, regular rhythm and normal heart sounds.   Pulmonary/Chest: Effort normal and breath sounds normal. No respiratory distress. He has no wheezes. He has no  rales. He exhibits no tenderness.  Abdominal: Soft. Bowel sounds are normal. He exhibits no distension and no mass. There is no tenderness. There is no rebound and no guarding.  Musculoskeletal: He exhibits no edema.  Vitals reviewed.         Assessment & Plan:  Other hyperlipidemia - Plan: CBC with Differential/Platelet, Comprehensive metabolic panel, Hemoglobin A1c, Lipid panel  Type 2 diabetes mellitus with complication, unspecified long term insulin use status (HCC) - Plan: CBC with Differential/Platelet, Comprehensive metabolic panel, Hemoglobin A1c, Lipid panel  Essential hypertension, benign - Plan: CBC with Differential/Platelet, Comprehensive metabolic panel, Hemoglobin A1c, Lipid panel  His blood pressure is acceptable. I recommended an annual diabetic eye exam. The patient received a flu shot today. Diabetic foot exam is performed and was normal. He does have significant pain in his right foot at the Achilles tendon at its insertion on the calcaneus. I believe he has bursitis in this area as well as Achilles tendinitis. I have recommended heel cups for his shoes, Aleve on a daily basis, icing that area at night, and padding in his shoes to prevent pressure in that area. Recheck if no better in 3 weeks or sooner if worse. Regarding his blood sugars, I would ultimately like to discontinue the glipizide and switch the patient to the toes up to try to achieve further weight loss and better manage his sugars. I will await the results of his A1c prior to making that change. We discussed that change in detail today at the office visit and whenever any possible side effects he can have from this. I will also check a fasting lipid panel. His goal LDL cholesterol is less than 100. He received his flu shot today

## 2015-12-15 NOTE — Addendum Note (Signed)
Addended by: Shary Decamp B on: 12/15/2015 12:55 PM   Modules accepted: Orders

## 2015-12-16 LAB — CBC WITH DIFFERENTIAL/PLATELET
BASOS ABS: 74 {cells}/uL (ref 0–200)
Basophils Relative: 1 %
Eosinophils Absolute: 222 cells/uL (ref 15–500)
Eosinophils Relative: 3 %
HEMATOCRIT: 51.2 % — AB (ref 38.5–50.0)
HEMOGLOBIN: 17.2 g/dL — AB (ref 13.0–17.0)
LYMPHS ABS: 2664 {cells}/uL (ref 850–3900)
Lymphocytes Relative: 36 %
MCH: 24.6 pg — AB (ref 27.0–33.0)
MCHC: 34.2 g/dL (ref 32.0–36.0)
MCV: 90.8 fL (ref 80.0–100.0)
MONO ABS: 296 {cells}/uL (ref 200–950)
MPV: 9.8 fL (ref 7.5–12.5)
Monocytes Relative: 4 %
NEUTROS PCT: 56 %
Neutro Abs: 4144 cells/uL (ref 1500–7800)
Platelets: 147 10*3/uL (ref 140–400)
RBC: 6.99 MIL/uL — ABNORMAL HIGH (ref 4.20–5.80)
RDW: 13.6 % (ref 11.0–15.0)
WBC: 7.4 10*3/uL (ref 3.8–10.8)

## 2015-12-16 LAB — HEMOGLOBIN A1C
Hgb A1c MFr Bld: 8.8 % — ABNORMAL HIGH (ref <5.7)
MEAN PLASMA GLUCOSE: 206 mg/dL

## 2015-12-19 ENCOUNTER — Encounter: Payer: Self-pay | Admitting: Family Medicine

## 2015-12-21 ENCOUNTER — Telehealth: Payer: Self-pay | Admitting: Family Medicine

## 2015-12-21 NOTE — Telephone Encounter (Signed)
Submitted PA for Invokana through [email or web address]

## 2015-12-21 NOTE — Telephone Encounter (Signed)
PA submitted through [email or web address]  

## 2015-12-22 MED ORDER — METFORMIN HCL 1000 MG PO TABS: 2000.0000 mg | ORAL_TABLET | Freq: Every day | ORAL | 3 refills | Status: DC

## 2015-12-22 NOTE — Telephone Encounter (Signed)
Insurance prefers Glucophage.   Pharmacy contacted and instructed to run as generic Glucophage XR.

## 2015-12-26 ENCOUNTER — Other Ambulatory Visit: Payer: Self-pay | Admitting: *Deleted

## 2015-12-26 ENCOUNTER — Encounter: Payer: Self-pay | Admitting: Podiatry

## 2015-12-26 ENCOUNTER — Ambulatory Visit (INDEPENDENT_AMBULATORY_CARE_PROVIDER_SITE_OTHER): Payer: BC Managed Care – PPO | Admitting: Podiatry

## 2015-12-26 ENCOUNTER — Ambulatory Visit (INDEPENDENT_AMBULATORY_CARE_PROVIDER_SITE_OTHER): Payer: BC Managed Care – PPO

## 2015-12-26 DIAGNOSIS — M722 Plantar fascial fibromatosis: Secondary | ICD-10-CM

## 2015-12-26 DIAGNOSIS — M7661 Achilles tendinitis, right leg: Secondary | ICD-10-CM | POA: Diagnosis not present

## 2015-12-26 MED ORDER — MELOXICAM 15 MG PO TABS: 15.0000 mg | ORAL_TABLET | Freq: Every day | ORAL | 3 refills | Status: DC

## 2015-12-26 NOTE — Patient Instructions (Signed)

## 2015-12-26 NOTE — Progress Notes (Signed)
   Subjective:    Patient ID: Matthew Osborne, male    DOB: 05-Aug-1975, 40 y.o.   MRN: [id number]  HPI: He presents today for chief complaint of pain to the posterior aspect of his right heel. He states this been aching and throbbing for the past several weeks he states that he noticed a bruise but he does not recall what happened to it. 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 He states that his hemoglobin A1c is around an 8.    Review of Systems  All other systems reviewed and are negative.      Objective:   Physical Exam: Vital signs are stable he is alert and oriented 3 pulses are palpable. Neurologic sensorium is intact. Deep tendon reflexes are intact. Muscle strength is normal symmetrical bilateral. Orthopedic evaluation demonstrates normal rectus foot that he does have tenderness on palpation of the Achilles tendon exquisite tenderness on palpation with no erythema cellulitis drainage or odor at the insertion site of the Achilles. Radiographs taken today bilateral foot does demonstrate a normal osseous architecture with posterior calcaneal heel spurs and slight thickening of the Achilles tendon in the right heel as opposed to the left. Otherwise there are no signs of fractures bilateral foot. Cutaneous evaluation was resected well-hydrated cutis no erythema or edema cellulitis drainage or a period        Assessment & Plan:  Insertional Achilles tendinitis right.  Plan: Injected the area today with dexamethasone and local anesthetic 2 mg was injected placed him in a Cam Walker boot. I will follow-up with him in 1 month. Started him on a Medrol Dosepak is to limit his time ambulating.

## 2016-01-10 NOTE — Telephone Encounter (Signed)
Pt was changed to Januvia which is covered by ins

## 2016-01-23 ENCOUNTER — Ambulatory Visit (INDEPENDENT_AMBULATORY_CARE_PROVIDER_SITE_OTHER): Payer: BC Managed Care – PPO | Admitting: Podiatry

## 2016-01-23 ENCOUNTER — Telehealth: Payer: Self-pay | Admitting: *Deleted

## 2016-01-23 ENCOUNTER — Encounter: Payer: Self-pay | Admitting: Podiatry

## 2016-01-23 DIAGNOSIS — M7661 Achilles tendinitis, right leg: Secondary | ICD-10-CM

## 2016-01-23 DIAGNOSIS — S86011D Strain of right Achilles tendon, subsequent encounter: Secondary | ICD-10-CM | POA: Diagnosis not present

## 2016-01-23 NOTE — Progress Notes (Signed)
He presents today states that his really been painful and burning recently he states that it almost brings me to tears.  Objective: Vital signs are stable he is alert and oriented 3 presents today utilizing his Cam [place] Rile was removed demonstrates moderate to severe pain on palpation of the posterior aspect of the Achilles as it inserts on the calcaneus. No open lesions or wounds are noted. He has no pain on medial and lateral compression of the calcaneus.  Assessment: Insertional Achilles tendinopathy or tendinitis.  Plan: I'm requesting an MRI since conservative therapies have failed to evaluate for surgical intervention.

## 2016-01-30 DIAGNOSIS — R52 Pain, unspecified: Secondary | ICD-10-CM

## 2016-02-06 ENCOUNTER — Ambulatory Visit
Admission: RE | Admit: 2016-02-06 | Discharge: 2016-02-06 | Disposition: A | Discharge status: Home / Self Care | Payer: BC Managed Care – PPO | Source: Ambulatory Visit | Attending: Podiatry | Admitting: Podiatry

## 2016-02-06 DIAGNOSIS — S86011D Strain of right Achilles tendon, subsequent encounter: Secondary | ICD-10-CM

## 2016-02-08 ENCOUNTER — Ambulatory Visit (INDEPENDENT_AMBULATORY_CARE_PROVIDER_SITE_OTHER): Payer: BC Managed Care – PPO | Admitting: Podiatry

## 2016-02-08 ENCOUNTER — Encounter: Payer: Self-pay | Admitting: Podiatry

## 2016-02-08 DIAGNOSIS — M7661 Achilles tendinitis, right leg: Secondary | ICD-10-CM | POA: Diagnosis not present

## 2016-02-09 NOTE — Telephone Encounter (Addendum)
-----   Message from Rip Harbour, [facility] sent at 02/08/2016  3:02 PM EST ----- Regarding: PT referral  Patient given PT referral to [facility] to setup appts. 02/09/2016-Faxed required form and demographics.

## 2016-02-11 NOTE — Progress Notes (Signed)
He presents today for a review of his MRI for his right lower extremity Achilles pain. He states that the pain is recalcitrant.  Objective: Vital signs stable alert and oriented 3. Pulses are palpable. Neurologic system is intact. Severe pain on palpation of the distal Achilles. MRI did suggest fraying and tearing of the distal Achilles with Haglund's deformity. Chronic degenerative Achilles tendinopathy.  Assessment: Degenerative Achilles tendinitis right.  Plan: We discussed surgery as primary correction however he does not want to do this at this time. The only other thing that I could offer him would be physical therapy. I did explain to him that is a great possibility that physical therapy may result in a rupture of this and he understands this possible complication. I'll follow-up with him once physical therapy as concluded.

## 2016-02-13 ENCOUNTER — Encounter: Payer: Self-pay | Admitting: Podiatry

## 2016-03-14 ENCOUNTER — Ambulatory Visit: Payer: BC Managed Care – PPO | Admitting: Podiatry

## 2016-03-19 ENCOUNTER — Encounter: Payer: Self-pay | Admitting: Podiatry

## 2016-03-19 ENCOUNTER — Ambulatory Visit (INDEPENDENT_AMBULATORY_CARE_PROVIDER_SITE_OTHER): Payer: BC Managed Care – PPO | Admitting: Podiatry

## 2016-03-19 DIAGNOSIS — M7661 Achilles tendinitis, right leg: Secondary | ICD-10-CM | POA: Diagnosis not present

## 2016-03-19 NOTE — Progress Notes (Signed)
He presents today for follow-up of his Achilles tendinitis. He is currently taking physical therapy and move surgery because of his diabetes. He states that he seems to be doing better at this point approximately 50% improved. He would like to extend his FMLA March 19.  Objective: Vital signs are stable alert and oriented 3. Much decrease in pain on palpation of the Achilles tendon right.  Assessment: Slowly resolving Achilles tendinitis right.  Plan: I'm skeptical as to whether or not this will go on and heal completely however we should continue physical therapy for least another month before allowing him to get back to work part-time.

## 2016-03-26 ENCOUNTER — Telehealth: Payer: BC Managed Care – PPO | Admitting: Family

## 2016-03-26 DIAGNOSIS — B9689 Other specified bacterial agents as the cause of diseases classified elsewhere: Secondary | ICD-10-CM

## 2016-03-26 DIAGNOSIS — J019 Acute sinusitis, unspecified: Secondary | ICD-10-CM

## 2016-03-26 MED ORDER — AZITHROMYCIN 250 MG PO TABS: ORAL_TABLET | ORAL | 0 refills | Status: DC

## 2016-03-26 NOTE — Progress Notes (Signed)
We are sorry that you are not feeling well.  Here is how we plan to help!  Based on what you have shared with me it looks like you have sinusitis.  Sinusitis is inflammation and infection in the sinus cavities of the head.  Based on your presentation I believe you most likely have Acute Bacterial Sinusitis.  This is an infection caused by bacteria and is treated with antibiotics. I have prescribed Z-pak as directed. You may use an oral decongestant such as Mucinex D or if you have glaucoma or high blood pressure use plain Mucinex. Saline nasal spray help and can safely be used as often as needed for congestion.  If you develop worsening sinus pain, fever or notice severe headache and vision changes, or if symptoms are not better after completion of antibiotic, please schedule an appointment with a health care provider.    Sinus infections are not as easily transmitted as other respiratory infection, however we still recommend that you avoid close contact with loved ones, especially the very young and elderly.  Remember to wash your hands thoroughly throughout the day as this is the number one way to prevent the spread of infection!  Home Care:  Only take medications as instructed by your medical team.  Complete the entire course of an antibiotic.  Do not take these medications with alcohol.  A steam or ultrasonic humidifier can help congestion.  You can place a towel over your head and breathe in the steam from hot water coming from a faucet.  Avoid close contacts especially the very young and the elderly.  Cover your mouth when you cough or sneeze.  Always remember to wash your hands.  Get Help Right Away If:  You develop worsening fever or sinus pain.  You develop a severe head ache or visual changes.  Your symptoms persist after you have completed your treatment plan.  Make sure you  Understand these instructions.  Will watch your condition.  Will get help right away if you are  not doing well or get worse.  Your e-visit answers were reviewed by a board certified advanced clinical practitioner to complete your personal care plan.  Depending on the condition, your plan could have included both over the counter or prescription medications.  If there is a problem please reply  once you have received a response from your provider.  Your safety is important to us.  If you have drug allergies check your prescription carefully.    You can use MyChart to ask questions about today's visit, request a non-urgent call back, or ask for a work or school excuse for 24 hours related to this e-Visit. If it has been greater than 24 hours you will need to follow up with your provider, or enter a new e-Visit to address those concerns.  You will get an e-mail in the next two days asking about your experience.  I hope that your e-visit has been valuable and will speed your recovery. Thank you for using e-visits.   

## 2016-04-09 ENCOUNTER — Telehealth: Payer: BC Managed Care – PPO | Admitting: Nurse Practitioner

## 2016-04-09 DIAGNOSIS — H5789 Other specified disorders of eye and adnexa: Secondary | ICD-10-CM

## 2016-04-09 DIAGNOSIS — H578 Other specified disorders of eye and adnexa: Secondary | ICD-10-CM

## 2016-04-09 MED ORDER — POLYMYXIN B-TRIMETHOPRIM 10000-0.1 UNIT/ML-% OP SOLN: 2.0000 [drp] | OPHTHALMIC | 0 refills | Status: DC

## 2016-04-09 NOTE — Progress Notes (Signed)
We are sorry that you are not feeling well.  Here is how we plan to help!  Based on what you have shared with me it looks like you have conjunctivitis.  Conjunctivitis is a common inflammatory or infectious condition of the eye that is often referred to as "pink eye".  In most cases it is contagious (viral or bacterial). However, not all conjunctivitis requires antibiotics (ex. Allergic).  We have made appropriate suggestions for you based upon your presentation.  I have prescribed Polytrim Ophthalmic drops 1-2 drops 4 times a day times 5 days  Pink eye can be highly contagious.  It is typically spread through direct contact with secretions, or contaminated objects or surfaces that one may have touched.  Strict handwashing is suggested with soap and water is urged.  If not available, use alcohol based had sanitizer.  Avoid unnecessary touching of the eye.  If you wear contact lenses, you will need to refrain from wearing them until you see no white discharge from the eye for at least 24 hours after being on medication.  You should see symptom improvement in 1-2 days after starting the medication regimen.  Call us if symptoms are not improved in 1-2 days.  Home Care:  Wash your hands often!  Do not wear your contacts until you complete your treatment plan.  Avoid sharing towels, bed linen, personal items with a person who has pink eye.  See attention for anyone in your home with similar symptoms.  Get Help Right Away If:  Your symptoms do not improve.  You develop blurred or loss of vision.  Your symptoms worsen (increased discharge, pain or redness)  Your e-visit answers were reviewed by a board certified advanced clinical practitioner to complete your personal care plan.  Depending on the condition, your plan could have included both over the counter or prescription medications.  If there is a problem please reply  once you have received a response from your provider.  Your safety is  important to us.  If you have drug allergies check your prescription carefully.    You can use MyChart to ask questions about today's visit, request a non-urgent call back, or ask for a work or school excuse for 24 hours related to this e-Visit. If it has been greater than 24 hours you will need to follow up with your provider, or enter a new e-Visit to address those concerns.   You will get an e-mail in the next two days asking about your experience.  I hope that your e-visit has been valuable and will speed your recovery. Thank you for using e-visits.      

## 2016-04-16 ENCOUNTER — Ambulatory Visit: Payer: BC Managed Care – PPO | Admitting: Podiatry

## 2016-04-18 ENCOUNTER — Encounter: Payer: Self-pay | Admitting: Podiatry

## 2016-04-18 ENCOUNTER — Ambulatory Visit (INDEPENDENT_AMBULATORY_CARE_PROVIDER_SITE_OTHER): Payer: BC Managed Care – PPO | Admitting: Podiatry

## 2016-04-18 DIAGNOSIS — M7661 Achilles tendinitis, right leg: Secondary | ICD-10-CM

## 2016-04-18 NOTE — Progress Notes (Signed)
He presents today for follow-up of a symptomatic Achilles tendinitis. He states that he is ready to get back to work and that he is feeling much better. He continues physical therapy at home. He would like to know if possible, if this returns and he just go straight to surgery.  Objective: Vital signs are stable alert and oriented 3 pulses are palpable. Much decrease in edema much decrease in warmth on palpation of the posterior aspect of the Achilles as it inserts on the calcaneus. Mild tenderness is present. Pulses are strongly palpable.  Assessment: Insertional Achilles tendinitis 90% resolved.  Plan: Allow him to get back to work he will follow-up with me on an as-needed basis. Continue his at home physical therapy.

## 2016-06-14 ENCOUNTER — Other Ambulatory Visit: Payer: Self-pay | Admitting: Family Medicine

## 2016-06-19 ENCOUNTER — Telehealth: Payer: BC Managed Care – PPO | Admitting: Family

## 2016-06-19 DIAGNOSIS — J209 Acute bronchitis, unspecified: Secondary | ICD-10-CM

## 2016-06-19 MED ORDER — AZITHROMYCIN 250 MG PO TABS: ORAL_TABLET | ORAL | 0 refills | Status: DC

## 2016-06-19 MED ORDER — BENZONATATE 100 MG PO CAPS: 100.0000 mg | ORAL_CAPSULE | Freq: Three times a day (TID) | ORAL | 0 refills | Status: DC | PRN

## 2016-06-19 NOTE — Progress Notes (Signed)

## 2016-06-20 ENCOUNTER — Other Ambulatory Visit: Payer: Self-pay | Admitting: Family Medicine

## 2016-08-21 ENCOUNTER — Telehealth: Payer: BC Managed Care – PPO | Admitting: Family

## 2016-08-21 DIAGNOSIS — A084 Viral intestinal infection, unspecified: Secondary | ICD-10-CM

## 2016-08-21 DIAGNOSIS — T63304A Toxic effect of unspecified spider venom, undetermined, initial encounter: Secondary | ICD-10-CM

## 2016-08-21 MED ORDER — MUPIROCIN 2 % EX OINT: 1.0000 "application " | TOPICAL_OINTMENT | Freq: Two times a day (BID) | CUTANEOUS | 0 refills | Status: DC

## 2016-08-21 MED ORDER — ONDANSETRON HCL 4 MG PO TABS: 4.0000 mg | ORAL_TABLET | Freq: Three times a day (TID) | ORAL | 0 refills | Status: DC | PRN

## 2016-08-21 NOTE — Progress Notes (Signed)
We are sorry that you are not feeling well. Here is how we plan to help!  Based on what you have shared with me it looks like you have a Virus that is irritating your GI tract.  Vomiting is the forceful emptying of a portion of the stomach's content through the mouth.  Although nausea and vomiting can make you feel miserable, it's important to remember that these are not diseases, but rather symptoms of an underlying illness.  When we treat short term symptoms, we always caution that any symptoms that persist should be fully evaluated in a medical office.  I have prescribed a medication that will help alleviate your symptoms and allow you to stay hydrated:  Zofran 4 mg 1 tablet every 8 hours as needed for nausea and vomiting  And  Topical mupiricin   HOME CARE:  Drink clear liquids.  This is very important! Dehydration (the lack of fluid) can lead to a serious complication.  Start off with 1 tablespoon every 5 minutes for 8 hours.  You may begin eating bland foods after 8 hours without vomiting.  Start with saltine crackers, white bread, rice, mashed potatoes, applesauce.  After 48 hours on a bland diet, you may resume a normal diet.  Try to go to sleep.  Sleep often empties the stomach and relieves the need to vomit.  GET HELP RIGHT AWAY IF:   Your symptoms do not improve or worsen within 2 days after treatment.  You have a fever for over 3 days.  You cannot keep down fluids after trying the medication.  MAKE SURE YOU:   Understand these instructions.  Will watch your condition.  Will get help right away if you are not doing well or get worse.   Thank you for choosing an e-visit. Your e-visit answers were reviewed by a board certified advanced clinical practitioner to complete your personal care plan. Depending upon the condition, your plan could have included both over the counter or prescription medications. Please review your pharmacy choice. Be sure that the pharmacy  you have chosen is open so that you can pick up your prescription now.  If there is a problem you may message your provider in [place] to have the prescription routed to another pharmacy. Your safety is important to [place]. If you have drug allergies check your prescription carefully.  For the next 24 hours, you can use MyChart to ask questions about today's visit, request a non-urgent call back, or ask for a work or school excuse from your e-visit provider. You will get an e-mail in the next two days asking about your experience. I hope that your e-visit has been valuable and will speed your recovery.

## 2016-10-20 ENCOUNTER — Other Ambulatory Visit: Payer: Self-pay | Admitting: Family Medicine

## 2016-10-21 NOTE — Telephone Encounter (Signed)
Medication refill for one time only.  Patient needs to be seen.  Letter sent for patient to call and schedule 

## 2016-10-25 ENCOUNTER — Encounter: Payer: BC Managed Care – PPO | Admitting: Family Medicine

## 2016-10-28 ENCOUNTER — Encounter: Payer: BC Managed Care – PPO | Admitting: Family Medicine

## 2016-11-01 ENCOUNTER — Encounter: Payer: Self-pay | Admitting: Family Medicine

## 2016-11-01 ENCOUNTER — Ambulatory Visit (INDEPENDENT_AMBULATORY_CARE_PROVIDER_SITE_OTHER): Payer: BC Managed Care – PPO | Admitting: Family Medicine

## 2016-11-01 ENCOUNTER — Ambulatory Visit: Payer: Self-pay | Admitting: Family Medicine

## 2016-11-01 VITALS — BP 140/78 | HR 78 | Temp 98.0°F | Resp 18 | Ht 73.5 in | Wt 254.0 lb

## 2016-11-01 DIAGNOSIS — E119 Type 2 diabetes mellitus without complications: Secondary | ICD-10-CM

## 2016-11-01 DIAGNOSIS — R5382 Chronic fatigue, unspecified: Secondary | ICD-10-CM | POA: Diagnosis not present

## 2016-11-01 NOTE — Progress Notes (Signed)
 Subjective:    Patient ID: Matthew Osborne, male    DOB: 12/17/1975, 41 y.o.   MRN: [id number]  Medication Refill   Patient is an uncontrolled type II diabetic who has not been seen since November 2017. At that time his hemoglobin A 1C was 8.8. He presents today with 2 months of fatigue, weight loss, anxiety, mood swings, trouble sleeping. Today he is on the verge of tears while talking to me. He does report increasing crying spells. He denies any chest pain. He denies any shortness of breath. He denies any dyspnea on exertion. He denies any cough. He denies any hemoptysis. He denies any fevers or chills or night sweats. He does report decreasing appetite, anhedonia, daily tension headaches, and insomnia. He denies any recent illness. He denies any melena or hematochezia. He denies any nausea or vomiting. He denies any dysuria or hematuria Past Medical History:  Diagnosis Date  . De Quervain's tenosynovitis, left 03/2011  . Diabetes mellitus    NIDDM  . Hyperlipidemia   . Hypertension    under control; has been on med. x 4 yrs.   Past Surgical History:  Procedure Laterality Date  . CARPAL TUNNEL RELEASE     bilat.  . DORSAL COMPARTMENT RELEASE  03/14/2011   Procedure: RELEASE DORSAL COMPARTMENT (DEQUERVAIN);  Surgeon: William M Gramig III, MD;  Location: [facility];  Service: Orthopedics;  Laterality: Left;  left wrist APL and EPL tenosynovectomy and 1st dorsal compartment release  . TONSILLECTOMY AND ADENOIDECTOMY  as a child  . VASECTOMY     Current Outpatient Prescriptions on File Prior to Visit  Medication Sig Dispense Refill  . atorvastatin (LIPITOR) 40 MG tablet TAKE 1 TABLET BY MOUTH DAILY. 90 tablet 0  . bisoprolol-hydrochlorothiazide (ZIAC) 10-6.25 MG tablet TAKE 1 TABLET EVERY MORNING 90 tablet 0  . blood glucose meter kit and supplies KIT Dispense based on patient and insurance preference. Use twice daily times daily as directed. (FOR ICD-10  E11.65) 1 each 0  .  Choline Fenofibrate (FENOFIBRIC ACID) 135 MG CPDR TAKE 1 CAPSULE BY MOUTH EVERY EVENING. PATIENT NEEDS APPOINTMENT BEFORE REFILLS 90 capsule 0  . fluticasone (FLONASE) 50 MCG/ACT nasal spray Place 2 sprays into both nostrils daily. 16 g 3  . glipiZIDE (GLUCOTROL XL) 10 MG 24 hr tablet TAKE 1 TABLET EVERY DAY 90 tablet 1  . glucose blood test strip Dispense based on patient and insurance preference.  Use twice daily as directed. (FOR ICD-10 E11.65) 100 each 11  . JANUVIA 100 MG tablet TAKE 1 TABLET BY MOUTH ONCE DAILY. PATIENT NEEDS APPOINTMENT FOR REFILLS 90 tablet 1  . meloxicam (MOBIC) 15 MG tablet Take 1 tablet (15 mg total) by mouth daily. 30 tablet 3  . metFORMIN (GLUCOPHAGE) 1000 MG tablet Take 2 tablets (2,000 mg total) by mouth daily with breakfast. 180 tablet 3  . metFORMIN (GLUCOPHAGE-XR) 500 MG 24 hr tablet TAKE 4 TABLETS WITH BREAKFAST 360 tablet 1  . mupirocin ointment (BACTROBAN) 2 % Apply 1 application topically 2 (two) times daily. 22 g 0  . ondansetron (ZOFRAN) 4 MG tablet Take 1 tablet (4 mg total) by mouth every 8 (eight) hours as needed for nausea or vomiting. 20 tablet 0  . trimethoprim-polymyxin b (POLYTRIM) ophthalmic solution Place 2 drops into both eyes every 4 (four) hours. 10 mL 0  . TRUEPLUS LANCETS 33G MISC 1 each by Does not apply route 2 (two) times daily. 100 each 11   No current   facility-administered medications on file prior to visit.    Allergies  Allergen Reactions  . Niaspan [Niacin] Rash   Social History   Social History  . Marital status: Married    Spouse name: N/A  . Number of children: N/A  . Years of education: N/A   Occupational History  . Not on file.   Social History Main Topics  . Smoking status: Former Smoker    Packs/day: 3.00    Years: 6.00    Types: Cigarettes    Quit date: 02/05/1999  . Smokeless tobacco: Never Used  . Alcohol use No  . Drug use: No  . Sexual activity: Not on file   Other Topics Concern  . Not on file    Social History Narrative  . No narrative on file     Review of Systems  All other systems reviewed and are negative.       Objective:   Physical Exam  Constitutional: He is oriented to person, place, and time. He appears well-developed and well-nourished.  HENT:  Head: Normocephalic and atraumatic.  Right Ear: External ear normal.  Left Ear: External ear normal.  Nose: Nose normal.  Mouth/Throat: Oropharynx is clear and moist. No oropharyngeal exudate.  Neck: Neck supple. No JVD present. No thyromegaly present.  Cardiovascular: Normal rate, regular rhythm and normal heart sounds.   Pulmonary/Chest: Effort normal and breath sounds normal. No respiratory distress. He has no wheezes. He has no rales. He exhibits no tenderness.  Abdominal: Soft. Bowel sounds are normal. He exhibits no distension and no mass. There is no tenderness. There is no rebound and no guarding.  Musculoskeletal: He exhibits no edema.  Lymphadenopathy:    He has no cervical adenopathy.  Neurological: He is oriented to person, place, and time. He has normal reflexes. No cranial nerve deficit. He exhibits normal muscle tone. Coordination normal.  Skin: No rash noted. No erythema. No pallor.  Psychiatric: His mood appears anxious.  tearful  Vitals reviewed.         Assessment & Plan:  Chronic fatigue - Plan: CBC with Differential/Platelet, COMPLETE METABOLIC PANEL WITH GFR, Lipid panel, TSH, Hemoglobin A1c, Microalbumin, urine, Testosterone Total,Free,Bio, Males  Controlled type 2 diabetes mellitus without complication, without long-term current use of insulin (HCC) - Plan: Lipid panel, Hemoglobin A1c, Microalbumin, urine  I truly believe that the fatigue, the mood swings, trouble sleeping, are related to anxiety and stress. However given the weight loss and the other somatic symptoms, I believe we need to do a more thorough diagnostic workup. Physical exam today is relatively normal aside from continued  weight loss which is unintentional. Therefore I will check a CBC, CMP, fasting lipid panel, TSH, hemoglobin A1c, microalbumin, total/free  testosterone level.  I will have the patient return Monday to review these lab results. If lab workup is essentially normal, I will turn our focus to treating anxiety and stress possibly with an SSRI. Obviously if labs revealed severe abnormalities, we will treat these as best we can. Given weight loss, consider a GI referral for an EGD and possibly colonoscopy if lab workup is unremarkable

## 2016-11-04 ENCOUNTER — Encounter: Payer: Self-pay | Admitting: Family Medicine

## 2016-11-04 ENCOUNTER — Ambulatory Visit (INDEPENDENT_AMBULATORY_CARE_PROVIDER_SITE_OTHER): Payer: BC Managed Care – PPO | Admitting: Family Medicine

## 2016-11-04 VITALS — BP 122/90 | HR 74 | Temp 98.0°F | Resp 16 | Ht 73.5 in | Wt 260.0 lb

## 2016-11-04 DIAGNOSIS — R5382 Chronic fatigue, unspecified: Secondary | ICD-10-CM

## 2016-11-04 DIAGNOSIS — R634 Abnormal weight loss: Secondary | ICD-10-CM

## 2016-11-04 LAB — COMPLETE METABOLIC PANEL WITH GFR
AG Ratio: 1.6 (calc) (ref 1.0–2.5)
ALBUMIN MSPROF: 4.5 g/dL (ref 3.6–5.1)
ALT: 41 U/L (ref 9–46)
AST: 24 U/L (ref 10–40)
Alkaline phosphatase (APISO): 72 U/L (ref 40–115)
BILIRUBIN TOTAL: 0.6 mg/dL (ref 0.2–1.2)
BUN: 9 mg/dL (ref 7–25)
CALCIUM: 9.7 mg/dL (ref 8.6–10.3)
CO2: 22 mmol/L (ref 20–32)
Chloride: 103 mmol/L (ref 98–110)
Creat: 0.86 mg/dL (ref 0.60–1.35)
GFR, EST AFRICAN AMERICAN: 125 mL/min/{1.73_m2} (ref >=60)
GFR, EST NON AFRICAN AMERICAN: 108 mL/min/{1.73_m2} (ref >=60)
GLUCOSE: 235 mg/dL — AB (ref 65–99)
Globulin: 2.9 g/dL (calc) (ref 1.9–3.7)
Potassium: 4.7 mmol/L (ref 3.5–5.3)
Sodium: 140 mmol/L (ref 135–146)
TOTAL PROTEIN: 7.4 g/dL (ref 6.1–8.1)

## 2016-11-04 LAB — CBC WITH DIFFERENTIAL/PLATELET
BASOS PCT: 0.8 %
Basophils Absolute: 63 cells/uL (ref 0–200)
EOS ABS: 103 {cells}/uL (ref 15–500)
Eosinophils Relative: 1.3 %
HCT: 47.3 % (ref 38.5–50.0)
HEMOGLOBIN: 15.9 g/dL (ref 13.2–17.1)
Lymphs Abs: 1872 cells/uL (ref 850–3900)
MCH: 29.9 pg (ref 27.0–33.0)
MCHC: 33.6 g/dL (ref 32.0–36.0)
MCV: 89.1 fL (ref 80.0–100.0)
MONOS PCT: 6.9 %
MPV: 9.6 fL (ref 7.5–12.5)
NEUTROS ABS: 5317 {cells}/uL (ref 1500–7800)
Neutrophils Relative %: 67.3 %
PLATELETS: 274 10*3/uL (ref 140–400)
RBC: 5.31 10*6/uL (ref 4.20–5.80)
RDW: 12.3 % (ref 11.0–15.0)
TOTAL LYMPHOCYTE: 23.7 %
WBC: 7.9 10*3/uL (ref 3.8–10.8)
WBCMIX: 545 {cells}/uL (ref 200–950)

## 2016-11-04 LAB — LIPID PANEL
CHOL/HDL RATIO: 4 (calc) (ref <5.0)
Cholesterol: 108 mg/dL (ref <200)
HDL: 27 mg/dL — ABNORMAL LOW (ref >40)
LDL CHOLESTEROL (CALC): 64 mg/dL
NON-HDL CHOLESTEROL (CALC): 81 mg/dL (ref <130)
TRIGLYCERIDES: 90 mg/dL (ref <150)

## 2016-11-04 LAB — SEDIMENTATION RATE: SED RATE: 6 mm/h (ref 0–15)

## 2016-11-04 LAB — MICROALBUMIN, URINE: Microalb, Ur: 1.6 mg/dL

## 2016-11-04 LAB — TESTOSTERONE TOTAL,FREE,BIO, MALES
ALBUMIN MSPROF: 4.5 g/dL (ref 3.6–5.1)
SEX HORMONE BINDING: 37 nmol/L (ref 10–50)
TESTOSTERONE FREE: 81.8 pg/mL (ref 46.0–224.0)
TESTOSTERONE: 640 ng/dL (ref 250–827)
Testosterone, Bioavailable: 168.3 ng/dL (ref >=110.0)

## 2016-11-04 LAB — TSH: TSH: 0.7 m[IU]/L (ref 0.40–4.50)

## 2016-11-04 LAB — HEMOGLOBIN A1C
EAG (MMOL/L): 10.1 (calc)
Hgb A1c MFr Bld: 8 % of total Hgb — ABNORMAL HIGH (ref <5.7)
MEAN PLASMA GLUCOSE: 183 (calc)

## 2016-11-04 NOTE — Progress Notes (Signed)
Subjective:    Patient ID: Matthew Osborne, male    DOB: 08/06/1975, 41 y.o.   MRN: [id number]  Medication Refill   11/01/16  Patient is an uncontrolled type II diabetic who has not been seen since November 2017. At that time his hemoglobin A 1C was 8.8. He presents today with 2 months of fatigue, weight loss, anxiety, mood swings, trouble sleeping. Today he is on the verge of tears while talking to me. He does report increasing crying spells. He denies any chest pain. He denies any shortness of breath. He denies any dyspnea on exertion. He denies any cough. He denies any hemoptysis. He denies any fevers or chills or night sweats. He does report decreasing appetite, anhedonia, daily tension headaches, and insomnia. He denies any recent illness. He denies any melena or hematochezia. He denies any nausea or vomiting. He denies any dysuria or hematuria.  At that time, my plan was: I truly believe that the fatigue, the mood swings, trouble sleeping, are related to anxiety and stress. However given the weight loss and the other somatic symptoms, I believe we need to do a more thorough diagnostic workup. Physical exam today is relatively normal aside from continued weight loss which is unintentional. Therefore I will check a CBC, CMP, fasting lipid panel, TSH, hemoglobin A1c, microalbumin, total/free  testosterone level.  I will have the patient return Monday to review these lab results. If lab workup is essentially normal, I will turn our focus to treating anxiety and stress possibly with an SSRI. Obviously if labs revealed severe abnormalities, we will treat these as best we can. Given weight loss, consider a GI referral for an EGD and possibly colonoscopy if lab workup is unremarkable.  11/04/16 Office Visit on 11/01/2016  Component Date Value Ref Range Status  . WBC 11/01/2016 7.9  3.8 - 10.8 Thousand/uL Final  . RBC 11/01/2016 5.31  4.20 - 5.80 Million/uL Final  . Hemoglobin 11/01/2016 15.9  13.2 - 17.1  g/dL Final  . HCT 11/01/2016 47.3  38.5 - 50.0 % Final  . MCV 11/01/2016 89.1  80.0 - 100.0 fL Final  . MCH 11/01/2016 29.9  27.0 - 33.0 pg Final  . MCHC 11/01/2016 33.6  32.0 - 36.0 g/dL Final  . RDW 11/01/2016 12.3  11.0 - 15.0 % Final  . Platelets 11/01/2016 274  140 - 400 Thousand/uL Final  . MPV 11/01/2016 9.6  7.5 - 12.5 fL Final  . Neutro Abs 11/01/2016 5317  1,500 - 7,800 cells/uL Final  . Lymphs Abs 11/01/2016 1872  850 - 3,900 cells/uL Final  . WBC mixed population 11/01/2016 545  200 - 950 cells/uL Final  . Eosinophils Absolute 11/01/2016 103  15 - 500 cells/uL Final  . Basophils Absolute 11/01/2016 63  0 - 200 cells/uL Final  . Neutrophils Relative % 11/01/2016 67.3  % Final  . Total Lymphocyte 11/01/2016 23.7  % Final  . Monocytes Relative 11/01/2016 6.9  % Final  . Eosinophils Relative 11/01/2016 1.3  % Final  . Basophils Relative 11/01/2016 0.8  % Final  . Glucose, Bld 11/01/2016 235* 65 - 99 mg/dL Final   Comment: .            Fasting reference interval . For someone without known diabetes, a glucose value >125 mg/dL indicates that they may have diabetes and this should be confirmed with a follow-up test. .   . BUN 11/01/2016 9  7 - 25 mg/dL Final  . Creat 11/01/2016 0.86  0.60 - 1.35 mg/dL Final  . GFR, Est Non African American 11/01/2016 108  > OR = 60 mL/min/1.55m Final  . GFR, Est African American 11/01/2016 125  > OR = 60 mL/min/1.750mFinal  . BUN/Creatinine Ratio 0984/69/6295OT APPLICABLE  6 - 22 (calc) Final  . Sodium 11/01/2016 140  135 - 146 mmol/L Final  . Potassium 11/01/2016 4.7  3.5 - 5.3 mmol/L Final  . Chloride 11/01/2016 103  98 - 110 mmol/L Final  . CO2 11/01/2016 22  20 - 32 mmol/L Final  . Calcium 11/01/2016 9.7  8.6 - 10.3 mg/dL Final  . Total Protein 11/01/2016 7.4  6.1 - 8.1 g/dL Final  . Albumin 11/01/2016 4.5  3.6 - 5.1 g/dL Final  . Globulin 11/01/2016 2.9  1.9 - 3.7 g/dL (calc) Final  . AG Ratio 11/01/2016 1.6  1.0 - 2.5 (calc)  Final  . Total Bilirubin 11/01/2016 0.6  0.2 - 1.2 mg/dL Final  . Alkaline phosphatase (APISO) 11/01/2016 72  40 - 115 U/L Final  . AST 11/01/2016 24  10 - 40 U/L Final  . ALT 11/01/2016 41  9 - 46 U/L Final  . Cholesterol 11/01/2016 108  <200 mg/dL Final  . HDL 11/01/2016 27* >40 mg/dL Final  . Triglycerides 11/01/2016 90  <150 mg/dL Final  . LDL Cholesterol (Calc) 11/01/2016 64  mg/dL (calc) Final   Comment: Reference range: <100 . Desirable range <100 mg/dL for primary prevention;   <70 mg/dL for patients with CHD or diabetic patients  with > or = 2 CHD risk factors. . Marland KitchenDL-C is now calculated using the Martin-Hopkins  calculation, which is a validated novel method providing  better accuracy than the Friedewald equation in the  estimation of LDL-C.  MaCresenciano Genret al. JAAnnamaria [id number]  (http://education.[facility].com/faq/FAQ164)   . Total CHOL/HDL Ratio 11/01/2016 4.0  <5.0 (calc) Final  . Non-HDL Cholesterol (Calc) 11/01/2016 81  <130 mg/dL (calc) Final   Comment: For patients with diabetes plus 1 major ASCVD risk  factor, treating to a non-HDL-C goal of <100 mg/dL  (LDL-C of <70 mg/dL) is considered a therapeutic  option.   . TSH 11/01/2016 0.70  0.40 - 4.50 mIU/L Final  . Hgb A1c MFr Bld 11/01/2016 8.0* <5.7 % of total Hgb Final   Comment: For someone without known diabetes, a hemoglobin A1c value of 6.5% or greater indicates that they may have  diabetes and this should be confirmed with a follow-up  test. . For someone with known diabetes, a value <7% indicates  that their diabetes is well controlled and a value  greater than or equal to 7% indicates suboptimal  control. A1c targets should be individualized based on  duration of diabetes, age, comorbid conditions, and  other considerations. . Currently, no consensus exists regarding use of hemoglobin A1c for diagnosis of diabetes for children. .   . Mean Plasma Glucose 11/01/2016 183  (calc)  Final  . eAG (mmol/L) 11/01/2016 10.1  (calc) Final  . Microalb, Ur 11/01/2016 1.6  mg/dL Final   Comment: Reference Range Not established   . RAM 11/01/2016    Final   Comment: . The ADA defines abnormalities in albumin excretion as follows: . Marland Kitchenategory         Result (mcg/mg creatinine) . Normal                    <30 Microalbuminuria         30-299  Clinical albuminuria   >  OR = 300 . The ADA recommends that at least two of three specimens collected within a 3-6 month period be abnormal before considering a patient to be within a diagnostic category.   . Testosterone 11/01/2016 640  250 - 827 ng/dL Final   Labs were significant for uncontrolled diabetes but were otherwise all within normal limits including his testosterone and TSH.  My suspicion Friday was that the patient was suffering from anxiety.  Regarding his A1c, we discussed victoza/trulicity but we both agreed to table this discussion until we have ruled out all other medical causes. After the fact, I thought that the patient would benefit from checking stool FIT x 3 to rule out an occult GI bleed or other evidence of GI malignancy as well as a sedimentation rate to evaluate for autoimmune diseases. He would like to proceed with this as well  Past Medical History:  Diagnosis Date  . De Quervain's tenosynovitis, left 03/2011  . Diabetes mellitus    NIDDM  . Hyperlipidemia   . Hypertension    under control; has been on med. x 4 yrs.   Past Surgical History:  Procedure Laterality Date  . CARPAL TUNNEL RELEASE     bilat.  . DORSAL COMPARTMENT RELEASE  03/14/2011   Procedure: RELEASE DORSAL COMPARTMENT (DEQUERVAIN);  Surgeon: Paulene Floor, MD;  Location: [facility];  Service: Orthopedics;  Laterality: Left;  left wrist APL and EPL tenosynovectomy and 1st dorsal compartment release  . TONSILLECTOMY AND ADENOIDECTOMY  as a child  . VASECTOMY     Current Outpatient Prescriptions on File Prior to  Visit  Medication Sig Dispense Refill  . aspirin 81 MG tablet Take 81 mg by mouth daily.    [name] atorvastatin (LIPITOR) 40 MG tablet TAKE 1 TABLET BY MOUTH DAILY. 90 tablet 0  . bisoprolol-hydrochlorothiazide (ZIAC) 10-6.25 MG tablet TAKE 1 TABLET EVERY MORNING 90 tablet 0  . blood glucose meter kit and supplies KIT Dispense based on patient and insurance preference. Use twice daily times daily as directed. (FOR ICD-10  E11.65) 1 each 0  . Choline Fenofibrate (FENOFIBRIC ACID) 135 MG CPDR TAKE 1 CAPSULE BY MOUTH EVERY EVENING. PATIENT NEEDS APPOINTMENT BEFORE REFILLS 90 capsule 0  . fluticasone (FLONASE) 50 MCG/ACT nasal spray Place 2 sprays into both nostrils daily. 16 g 3  . glipiZIDE (GLUCOTROL XL) 10 MG 24 hr tablet TAKE 1 TABLET EVERY DAY 90 tablet 1  . glucose blood test strip Dispense based on patient and insurance preference.  Use twice daily as directed. (FOR ICD-10 E11.65) 100 each 11  . JANUVIA 100 MG tablet TAKE 1 TABLET BY MOUTH ONCE DAILY. PATIENT NEEDS APPOINTMENT FOR REFILLS 90 tablet 1  . meloxicam (MOBIC) 15 MG tablet Take 1 tablet (15 mg total) by mouth daily. 30 tablet 3  . metFORMIN (GLUCOPHAGE) 1000 MG tablet Take 2 tablets (2,000 mg total) by mouth daily with breakfast. 180 tablet 3  . metFORMIN (GLUCOPHAGE-XR) 500 MG 24 hr tablet TAKE 4 TABLETS WITH BREAKFAST 360 tablet 1  . mupirocin ointment (BACTROBAN) 2 % Apply 1 application topically 2 (two) times daily. 22 g 0  . ondansetron (ZOFRAN) 4 MG tablet Take 1 tablet (4 mg total) by mouth every 8 (eight) hours as needed for nausea or vomiting. 20 tablet 0  . trimethoprim-polymyxin b (POLYTRIM) ophthalmic solution Place 2 drops into both eyes every 4 (four) hours. 10 mL 0  . TRUEPLUS LANCETS 33G MISC 1 each by Does not apply  route 2 (two) times daily. 100 each 11   No current facility-administered medications on file prior to visit.    Allergies  Allergen Reactions  . Niaspan [Niacin] Rash   Social History   Social  History  . Marital status: Married    Spouse name: N/A  . Number of children: N/A  . Years of education: N/A   Occupational History  . Not on file.   Social History Main Topics  . Smoking status: Former Smoker    Packs/day: 3.00    Years: 6.00    Types: Cigarettes    Quit date: 02/05/1999  . Smokeless tobacco: Never Used  . Alcohol use No  . Drug use: No  . Sexual activity: Not on file   Other Topics Concern  . Not on file   Social History Narrative  . No narrative on file     Review of Systems  All other systems reviewed and are negative.       Objective:   Physical Exam  Constitutional: He is oriented to person, place, and time. He appears well-developed and well-nourished.  HENT:  Head: Normocephalic and atraumatic.  Right Ear: External ear normal.  Left Ear: External ear normal.  Nose: Nose normal.  Mouth/Throat: Oropharynx is clear and moist. No oropharyngeal exudate.  Neck: Neck supple. No JVD present. No thyromegaly present.  Cardiovascular: Normal rate, regular rhythm and normal heart sounds.   Pulmonary/Chest: Effort normal and breath sounds normal. No respiratory distress. He has no wheezes. He has no rales. He exhibits no tenderness.  Abdominal: Soft. Bowel sounds are normal. He exhibits no distension and no mass. There is no tenderness. There is no rebound and no guarding.  Musculoskeletal: He exhibits no edema.  Lymphadenopathy:    He has no cervical adenopathy.  Neurological: He is oriented to person, place, and time. He has normal reflexes. No cranial nerve deficit. He exhibits normal muscle tone. Coordination normal.  Skin: No rash noted. No erythema. No pallor.  Vitals reviewed.         Assessment & Plan:  Weight loss - Plan: Sedimentation rate, Fecal occult blood, imunochemical, Fecal occult blood, imunochemical, Fecal occult blood, imunochemical  Check stool for fecal occult blood 3 as well as a sedimentation rate. If these tests are  normal, I believe the patient's issues are related to situational anxiety. We could then discuss options to manage situational anxiety but the patient is hesitant to pursue any medication at this time. I would recommend either victoza or trulicity to manage diabetes once he is comfortable that we have excluded other medical causes of his symptoms. I am concerned that the medication may cause nausea and that would contribute to anxiety until we have ruled out/exhausted other medical possibilities. I discussed this with him at length today and he is comfortable with this plan

## 2016-12-04 ENCOUNTER — Other Ambulatory Visit: Payer: Self-pay | Admitting: Family Medicine

## 2016-12-04 MED ORDER — FENOFIBRIC ACID 135 MG PO CPDR: DELAYED_RELEASE_CAPSULE | ORAL | 1 refills | Status: DC

## 2017-01-21 ENCOUNTER — Other Ambulatory Visit: Payer: Self-pay | Admitting: Family Medicine

## 2017-01-22 ENCOUNTER — Other Ambulatory Visit: Payer: Self-pay | Admitting: Family Medicine

## 2017-02-18 ENCOUNTER — Encounter: Payer: Self-pay | Admitting: Family Medicine

## 2017-05-22 ENCOUNTER — Other Ambulatory Visit: Payer: Self-pay | Admitting: Family Medicine

## 2017-05-27 ENCOUNTER — Other Ambulatory Visit: Payer: Self-pay | Admitting: Family Medicine

## 2017-06-10 ENCOUNTER — Other Ambulatory Visit: Payer: Self-pay | Admitting: Family Medicine

## 2017-06-11 MED FILL — MELOXICAM 15 MG TABLET: 15 | 30 days supply | Qty: 30 | Fill #0

## 2017-07-01 ENCOUNTER — Other Ambulatory Visit: Payer: Self-pay | Admitting: Family Medicine

## 2017-07-02 ENCOUNTER — Encounter: Payer: Self-pay | Admitting: Family Medicine

## 2017-07-02 ENCOUNTER — Other Ambulatory Visit: Payer: Self-pay | Admitting: Family Medicine

## 2017-07-02 ENCOUNTER — Ambulatory Visit: Payer: BC Managed Care – PPO | Admitting: Family Medicine

## 2017-07-02 VITALS — BP 140/90 | HR 82 | Temp 98.2°F | Resp 18 | Ht 73.5 in | Wt 265.0 lb

## 2017-07-02 DIAGNOSIS — Z79899 Other long term (current) drug therapy: Secondary | ICD-10-CM

## 2017-07-02 DIAGNOSIS — J0140 Acute pansinusitis, unspecified: Secondary | ICD-10-CM

## 2017-07-02 DIAGNOSIS — E7849 Other hyperlipidemia: Secondary | ICD-10-CM

## 2017-07-02 DIAGNOSIS — I10 Essential (primary) hypertension: Secondary | ICD-10-CM | POA: Diagnosis not present

## 2017-07-02 DIAGNOSIS — E119 Type 2 diabetes mellitus without complications: Secondary | ICD-10-CM

## 2017-07-02 DIAGNOSIS — J209 Acute bronchitis, unspecified: Secondary | ICD-10-CM

## 2017-07-02 DIAGNOSIS — R05 Cough: Secondary | ICD-10-CM

## 2017-07-02 DIAGNOSIS — J069 Acute upper respiratory infection, unspecified: Secondary | ICD-10-CM | POA: Diagnosis not present

## 2017-07-02 DIAGNOSIS — R059 Cough, unspecified: Secondary | ICD-10-CM

## 2017-07-02 MED ORDER — PREDNISONE 20 MG PO TABS: 40.0000 mg | ORAL_TABLET | Freq: Every day | ORAL | 0 refills | Status: AC

## 2017-07-02 MED ORDER — IPRATROPIUM-ALBUTEROL 0.5-2.5 (3) MG/3ML IN SOLN
3.0000 mL | Freq: Once | RESPIRATORY_TRACT | Status: AC
  Administered 2017-07-02: 3 mL via RESPIRATORY_TRACT

## 2017-07-02 MED ORDER — BENZONATATE 100 MG PO CAPS: 100.0000 mg | ORAL_CAPSULE | Freq: Three times a day (TID) | ORAL | 0 refills | Status: DC | PRN

## 2017-07-02 MED ORDER — HYDROCODONE-HOMATROPINE 5-1.5 MG/5ML PO SYRP: 5.0000 mL | ORAL_SOLUTION | Freq: Three times a day (TID) | ORAL | 0 refills | Status: DC | PRN

## 2017-07-02 MED ORDER — ALBUTEROL SULFATE HFA 108 (90 BASE) MCG/ACT IN AERS: 2.0000 | INHALATION_SPRAY | RESPIRATORY_TRACT | 0 refills | Status: DC | PRN

## 2017-07-02 NOTE — Patient Instructions (Signed)
Continue sudafed, mucinex, allergy medicines.  Try to help clear out your sinuses, esp after waking up in the morning, with steamy shower, humidifiers, saline nasal spray.  Also start using your steroid nasal spray daily.  Start taking steroids today, ASAP.  Use inhaler as needed, use cough meds as needed.  Get plenty of rest, drink ample fluids, use tylenol/ibuprofen as needed for aches, pain, fever  Work note provided for two days.   Bronchitis and cold/sinus congestion are 95% VIRAL illnesses, which means they are self limiting and run their course.  This can take a few days to several weeks.  Go get the chest x ray done if you do not start feeling somewhat better in the next 3-5 days.    If xray is positive we will start antibiotics.    If you have new spike in fever with severe sinus tenderness, new chest pain with breathing, shortness of breath, night sweats, fevers...   These things are more concerning for a bacterial infection that would need a course of antibiotics.   Right now we need to wait, give your body a little bit of time, and see if you improve.       Upper Respiratory Infection, Adult Most upper respiratory infections (URIs) are caused by a virus. A URI affects the nose, throat, and upper air passages. The most common type of URI is often called "the common cold." Follow these instructions at home:  Take medicines only as told by your doctor.  Gargle warm saltwater or take cough drops to comfort your throat as told by your doctor.  Use a warm mist humidifier or inhale steam from a shower to increase air moisture. This may make it easier to breathe.  Drink enough fluid to keep your pee (urine) clear or pale yellow.  Eat soups and other clear broths.  Have a healthy diet.  Rest as needed.  Go back to work when your fever is gone or your doctor says it is okay. ? You may need to stay home longer to avoid giving your URI to others. ? You can also wear a face  mask and wash your hands often to prevent spread of the virus.  Use your inhaler more if you have asthma.  Do not use any tobacco products, including cigarettes, chewing tobacco, or electronic cigarettes. If you need help quitting, ask your doctor. Contact a doctor if:  You are getting worse, not better.  Your symptoms are not helped by medicine.  You have chills.  You are getting more short of breath.  You have brown or red mucus.  You have yellow or brown discharge from your nose.  You have pain in your face, especially when you bend forward.  You have a fever.  You have puffy (swollen) neck glands.  You have pain while swallowing.  You have white areas in the back of your throat. Get help right away if:  You have very bad or constant: ? Headache. ? Ear pain. ? Pain in your forehead, behind your eyes, and over your cheekbones (sinus pain). ? Chest pain.  You have long-lasting (chronic) lung disease and any of the following: ? Wheezing. ? Long-lasting cough. ? Coughing up blood. ? A change in your usual mucus.  You have a stiff neck.  You have changes in your: ? Vision. ? Hearing. ? Thinking. ? Mood. This information is not intended to replace advice given to you by your health care provider. Make sure you discuss  any questions you have with your health care provider. Document Released: 07/10/2007 Document Revised: 09/24/2015 Document Reviewed: 04/28/2013 Elsevier Interactive Patient Education  2018 [employer].   Acute Bronchitis, Adult Acute bronchitis is sudden (acute) swelling of the air tubes (bronchi) in the lungs. Acute bronchitis causes these tubes to fill with mucus, which can make it hard to breathe. It can also cause coughing or wheezing. In adults, acute bronchitis usually goes away within 2 weeks. A cough caused by bronchitis may last up to 3 weeks. Smoking, allergies, and asthma can make the condition worse. Repeated episodes of bronchitis may  cause further lung problems, such as chronic obstructive pulmonary disease (COPD). What are the causes? This condition can be caused by germs and by substances that irritate the lungs, including:  Cold and flu viruses. This condition is most often caused by the same virus that causes a cold.  Bacteria.  Exposure to tobacco smoke, dust, fumes, and air pollution.  What increases the risk? This condition is more likely to develop in people who:  Have close contact with someone with acute bronchitis.  Are exposed to lung irritants, such as tobacco smoke, dust, fumes, and vapors.  Have a weak immune system.  Have a respiratory condition such as asthma.  What are the signs or symptoms? Symptoms of this condition include:  A cough.  Coughing up clear, yellow, or green mucus.  Wheezing.  Chest congestion.  Shortness of breath.  A fever.  Body aches.  Chills.  A sore throat.  How is this diagnosed? This condition is usually diagnosed with a physical exam. During the exam, your health care provider may order tests, such as chest X-rays, to rule out other conditions. He or she may also:  Test a sample of your mucus for bacterial infection.  Check the level of oxygen in your blood. This is done to check for pneumonia.  Do a chest X-ray or lung function testing to rule out pneumonia and other conditions.  Perform blood tests.  Your health care provider will also ask about your symptoms and medical history. How is this treated? Most cases of acute bronchitis clear up over time without treatment. Your health care provider may recommend:  Drinking more fluids. Drinking more makes your mucus thinner, which may make it easier to breathe.  Taking a medicine for a fever or cough.  Taking an antibiotic medicine.  Using an inhaler to help improve shortness of breath and to control a cough.  Using a cool mist vaporizer or humidifier to make it easier to breathe.  Follow  these instructions at home: Medicines  Take over-the-counter and prescription medicines only as told by your health care provider.  If you were prescribed an antibiotic, take it as told by your health care provider. Do not stop taking the antibiotic even if you start to feel better. General instructions  Get plenty of rest.  Drink enough fluids to keep your urine clear or pale yellow.  Avoid smoking and secondhand smoke. Exposure to cigarette smoke or irritating chemicals will make bronchitis worse. If you smoke and you need help quitting, ask your health care provider. Quitting smoking will help your lungs heal faster.  Use an inhaler, cool mist vaporizer, or humidifier as told by your health care provider.  Keep all follow-up visits as told by your health care provider. This is important. How is this prevented? To lower your risk of getting this condition again:  Wash your hands often with soap and  water. If soap and water are not available, use hand sanitizer.  Avoid contact with people who have cold symptoms.  Try not to touch your hands to your mouth, nose, or eyes.  Make sure to get the flu shot every year.  Contact a health care provider if:  Your symptoms do not improve in 2 weeks of treatment. Get help right away if:  You cough up blood.  You have chest pain.  You have severe shortness of breath.  You become dehydrated.  You faint or keep feeling like you are going to faint.  You keep vomiting.  You have a severe headache.  Your fever or chills gets worse. This information is not intended to replace advice given to you by your health care provider. Make sure you discuss any questions you have with your health care provider. Document Released: 02/29/2004 Document Revised: 08/16/2015 Document Reviewed: 07/12/2015 Elsevier Interactive Patient Education  [employer].

## 2017-07-02 NOTE — Progress Notes (Signed)
Patient ID: Matthew Osborne, male    DOB: 1976-01-12, 42 y.o.   MRN: [id number]  PCP: Susy Frizzle, MD  Chief Complaint  Patient presents with  . Chest congestion, cough, chills,ST, ear pressure x 5 days    Subjective:   Matthew Osborne is a 42 y.o. male, presents to clinic with CC of 5 days of nasal congestion, nasal drainage, sore throat, ear pressure, chest congestion with productive cough with green to gray sputum.  Currently most severe symptoms are most concerning to him is cough, chest tightness, wheezy feeling, difficulty breathing because of severe congestion in his nose.  He has had associated subjective fever, chills and fatigue.  He is a former smoker, reports getting bronchitis and needing albuterol 1-2 times per year.  Since onset of symptoms he feels it is gradually worsened.  He has used allergy medications, nasal sprays, Sudafed, Mucinex and has noted no improvement.  He denies night sweats, weight loss, exertional dyspnea, abdominal pain, back pain, chest pain, nausea, vomiting, diarrhea, rash.    He also request to do his lab work for his other chronic health conditions which are managed by Dr. Dennard Schaumann, he also requests refill for his home meds.   Patient Active Problem List   Diagnosis Date Noted  . Allergic rhinitis 06/17/2014  . Sinus congestion 05/27/2014  . Chronic cough 02/14/2014  . Essential hypertension, benign 06/30/2013  . Hyperlipidemia 06/30/2013  . Diabetes ([place]) 06/30/2013     Prior to Admission medications   Medication Sig Start Date End Date Taking? Authorizing Provider  aspirin 81 MG tablet Take 81 mg by mouth daily.   Yes [provider]  atorvastatin (LIPITOR) 40 MG tablet TAKE 1 TABLET BY MOUTH EVERY DAY 07/02/17  Yes Susy Frizzle, MD  bisoprolol-hydrochlorothiazide [facility]) 10-6.25 MG tablet TAKE 1 TABLET BY MOUTH EVERY DAY IN THE MORNING 07/02/17  Yes Susy Frizzle, MD  blood glucose meter kit and supplies KIT Dispense based  on patient and insurance preference. Use twice daily times daily as directed. (FOR ICD-10  E11.65) 09/21/15  Yes Susy Frizzle, MD  Choline Fenofibrate (FENOFIBRIC ACID) 135 MG CPDR TAKE 1 CAPSULE BY MOUTH EVERY EVENING. 12/04/16  Yes Susy Frizzle, MD  glipiZIDE (GLUCOTROL XL) 10 MG 24 hr tablet TAKE 1 TABLET BY MOUTH EVERY DAY 07/02/17  Yes Susy Frizzle, MD  glucose blood test strip Dispense based on patient and insurance preference.  Use twice daily as directed. (FOR ICD-10 E11.65) 11/03/14  Yes Pickard, Cammie Mcgee, MD  JANUVIA 100 MG tablet TAKE 1 TABLET BY MOUTH ONCE DAILY. PATIENT NEEDS APPOINTMENT FOR REFILLS 07/02/17  Yes Susy Frizzle, MD  metFORMIN (GLUCOPHAGE-XR) 500 MG 24 hr tablet TAKE 4 TABLETS WITH BREAKFAST 07/02/17  Yes Susy Frizzle, MD  TRUEPLUS LANCETS 33G MISC 1 each by Does not apply route 2 (two) times daily. 07/06/13  Yes Susy Frizzle, MD  albuterol (PROVENTIL HFA;VENTOLIN HFA) 108 (90 Base) MCG/ACT inhaler Inhale 2 puffs into the lungs every 4 (four) hours as needed for wheezing or shortness of breath. 07/02/17   Delsa Grana, PA-C  benzonatate (TESSALON) 100 MG capsule Take 1 capsule (100 mg total) by mouth 3 (three) times daily as needed for cough. 07/02/17   Delsa Grana, PA-C  HYDROcodone-homatropine (HYCODAN) 5-1.5 MG/5ML syrup Take 5 mLs by mouth every 8 (eight) hours as needed for cough. 07/02/17   Delsa Grana, PA-C  predniSONE (DELTASONE) 20 MG tablet Take 2  tablets (40 mg total) by mouth daily with breakfast for 5 days. 07/02/17 07/07/17  Delsa Grana, PA-C     Allergies  Allergen Reactions  . Niaspan [Niacin] Rash     Family History  Problem Relation Age of Onset  . Cancer Other   . Hyperlipidemia Other   . Hypertension Brother   . Asthma Son   . Lung cancer Maternal Grandfather   . Rheum arthritis Maternal Grandmother      Social History   Socioeconomic History  . Marital status: Married    Spouse name: Not on file  . Number of  children: Not on file  . Years of education: Not on file  . Highest education level: Not on file  Occupational History  . Not on file  Social Needs  . Financial resource strain: Not on file  . Food insecurity:    Worry: Not on file    Inability: Not on file  . Transportation needs:    Medical: Not on file    Non-medical: Not on file  Tobacco Use  . Smoking status: Former Smoker    Packs/day: 3.00    Years: 6.00    Pack years: 18.00    Types: Cigarettes    Last attempt to quit: 02/05/1999    Years since quitting: 18.4  . Smokeless tobacco: Never Used  Substance and Sexual Activity  . Alcohol use: No    Alcohol/week: 0.0 oz  . Drug use: No  . Sexual activity: Not on file  Lifestyle  . Physical activity:    Days per week: Not on file    Minutes per session: Not on file  . Stress: Not on file  Relationships  . Social connections:    Talks on phone: Not on file    Gets together: Not on file    Attends religious service: Not on file    Active member of club or organization: Not on file    Attends meetings of clubs or organizations: Not on file    Relationship status: Not on file  . Intimate partner violence:    Fear of current or ex partner: Not on file    Emotionally abused: Not on file    Physically abused: Not on file    Forced sexual activity: Not on file  Other Topics Concern  . Not on file  Social History Narrative  . Not on file     Review of Systems  Constitutional: Positive for chills. Negative for activity change, appetite change, diaphoresis and unexpected weight change.  HENT: Positive for congestion, postnasal drip, rhinorrhea, sinus pressure, sore throat and voice change. Negative for dental problem, drooling, ear discharge, ear pain, facial swelling, hearing loss, mouth sores, nosebleeds, sinus pain, sneezing, tinnitus and trouble swallowing.   Eyes: Negative.   Respiratory: Positive for cough, chest tightness and wheezing. Negative for apnea, choking  and stridor.   Cardiovascular: Negative.  Negative for chest pain, palpitations and leg swelling.  Gastrointestinal: Negative.  Negative for abdominal distention, abdominal pain, constipation, diarrhea, nausea and vomiting.  Endocrine: Negative.   Genitourinary: Negative.   Musculoskeletal: Negative.  Negative for arthralgias and myalgias.  Skin: Negative.  Negative for color change, pallor and rash.  Allergic/Immunologic: Positive for environmental allergies. Negative for food allergies.  Neurological: Negative.  Negative for dizziness, syncope, facial asymmetry, weakness, light-headedness, numbness and headaches.  Hematological: Negative.   All other systems reviewed and are negative.      Objective:    Vitals:  07/02/17 1045  BP: 140/90  Pulse: 82  Resp: 18  Temp: 98.2 F (36.8 C)  TempSrc: Oral  SpO2: 97%  Weight: 265 lb (120.2 kg)  Height: 6' 1.5" (1.867 m)      Physical Exam  Constitutional: He is oriented to person, place, and time. He appears well-developed and well-nourished.  Non-toxic appearance. He does not appear ill. No distress. He is not intubated.  Obese adult male, appears mildly ill, nontoxic-appearing, frequently coughing but no distress, appears stated age  HENT:  Head: Normocephalic and atraumatic.  Right Ear: Tympanic membrane, external ear and ear canal normal. Tympanic membrane is not erythematous. No middle ear effusion.  Left Ear: Tympanic membrane, external ear and ear canal normal. Tympanic membrane is not erythematous.  No middle ear effusion.  Nose: Mucosal edema and rhinorrhea present. Right sinus exhibits no maxillary sinus tenderness and no frontal sinus tenderness. Left sinus exhibits no maxillary sinus tenderness and no frontal sinus tenderness.  Mouth/Throat: Uvula is midline and mucous membranes are normal. Mucous membranes are not pale, not dry and not cyanotic. No trismus in the jaw. No uvula swelling. Posterior oropharyngeal edema  present. No oropharyngeal exudate, posterior oropharyngeal erythema or tonsillar abscesses. Tonsils are 1+ on the right. Tonsils are 1+ on the left. No tonsillar exudate.  Visible postnasal drip  Eyes: Pupils are equal, round, and reactive to light. Conjunctivae, EOM and lids are normal. Right eye exhibits no discharge and no exudate. Left eye exhibits no discharge and no exudate. Right conjunctiva is not injected. Left conjunctiva is not injected.  Neck: Trachea normal, normal range of motion, full passive range of motion without pain and phonation normal. Neck supple. No tracheal deviation present.  Cardiovascular: Regular rhythm, normal heart sounds and normal pulses.  No extrasystoles are present. PMI is not displaced. Exam reveals no gallop and no friction rub.  No murmur heard. Pulses:      Radial pulses are 2+ on the right side, and 2+ on the left side.       Posterior tibial pulses are 2+ on the right side, and 2+ on the left side.  No lower extremity edema  Pulmonary/Chest: Effort normal and breath sounds normal. No accessory muscle usage or stridor. He is not intubated. No respiratory distress. He has no decreased breath sounds. He has no wheezes. He has no rhonchi. He has no rales.  Frequent cough  Abdominal: Soft. Normal appearance and bowel sounds are normal. He exhibits no distension. There is no tenderness. There is no rebound and no guarding.  Musculoskeletal: Normal range of motion. He exhibits no edema.  Lymphadenopathy:       Head (right side): No submental, no submandibular and no tonsillar adenopathy present.       Head (left side): No submental, no submandibular and no tonsillar adenopathy present.    He has no cervical adenopathy.  Neurological: He is alert and oriented to person, place, and time. Gait normal.  Skin: Skin is warm, dry and intact. Capillary refill takes less than 2 seconds. No rash noted. He is not diaphoretic.  Psychiatric: He has a normal mood and affect.  His speech is normal and behavior is normal.  Nursing note and vitals reviewed.         Assessment & Plan:      ICD-10-CM   1. Upper respiratory tract infection, unspecified type J06.9    Suspect viral illness with baseline allergies, not taking allergy medication daily but intermittently  2. Acute bronchitis,  unspecified organism J20.9    Bronchitis without wheeze on exam though I suspect bronchospasms with severe and frequent coughing pattern.    3. Controlled type 2 diabetes mellitus without complication, without long-term current use of insulin (HCC) E11.9 CBC with Differential/Platelet    Comprehensive metabolic panel    Lipid panel    Hemoglobin A1c    CANCELED: CBC with Differential/Platelet    CANCELED: Comprehensive metabolic panel    CANCELED: Hemoglobin A1c    CANCELED: Lipid panel  4. Other hyperlipidemia E78.49 CBC with Differential/Platelet    Comprehensive metabolic panel    Lipid panel    Hemoglobin A1c    CANCELED: CBC with Differential/Platelet    CANCELED: Comprehensive metabolic panel    CANCELED: Hemoglobin A1c    CANCELED: Lipid panel  5. Essential hypertension, benign I10 CBC with Differential/Platelet    Comprehensive metabolic panel    Lipid panel    Hemoglobin A1c    CANCELED: CBC with Differential/Platelet    CANCELED: Comprehensive metabolic panel    CANCELED: Hemoglobin A1c    CANCELED: Lipid panel  6. Encounter for long-term (current) use of medications Z79.899 CBC with Differential/Platelet    Comprehensive metabolic panel    Lipid panel    Hemoglobin A1c    CANCELED: CBC with Differential/Platelet    CANCELED: Comprehensive metabolic panel    CANCELED: Hemoglobin A1c    CANCELED: Lipid panel  7. Cough R05 DG Chest 2 View  8. Acute pansinusitis, recurrence not specified J01.40    Viral and allergic, currently not suspicious for acute bacterial sinusitis.  No indication for antibiotics today, although severity of congestion and discharge,  if Matthew Osborne's sx worsen acutely, or last beyond 8-10 days, feel he would need tx with abx and will need to d/c sudafed.     Patient was given a breathing treatment in clinic and reevaluated afterwards, breath sounds continue to be clear to auscultation but he did have much less coughing and he felt like his chest was less tight, easier to breathe etc., inhaler sent to pharmacy.  Delsa Grana, PA-C 07/02/17 11:54 AM

## 2017-07-03 LAB — COMPREHENSIVE METABOLIC PANEL
AG RATIO: 1.5 (calc) (ref 1.0–2.5)
ALT: 47 U/L — ABNORMAL HIGH (ref 9–46)
AST: 32 U/L (ref 10–40)
Albumin: 4.4 g/dL (ref 3.6–5.1)
Alkaline phosphatase (APISO): 83 U/L (ref 40–115)
BUN: 9 mg/dL (ref 7–25)
CHLORIDE: 99 mmol/L (ref 98–110)
CO2: 25 mmol/L (ref 20–32)
CREATININE: 0.9 mg/dL (ref 0.60–1.35)
Calcium: 9.2 mg/dL (ref 8.6–10.3)
GLOBULIN: 3 g/dL (ref 1.9–3.7)
GLUCOSE: 265 mg/dL — AB (ref 65–99)
POTASSIUM: 4.5 mmol/L (ref 3.5–5.3)
Sodium: 134 mmol/L — ABNORMAL LOW (ref 135–146)
Total Bilirubin: 0.5 mg/dL (ref 0.2–1.2)
Total Protein: 7.4 g/dL (ref 6.1–8.1)

## 2017-07-03 LAB — CBC WITH DIFFERENTIAL/PLATELET
BASOS PCT: 1.3 %
Basophils Absolute: 83 cells/uL (ref 0–200)
EOS ABS: 378 {cells}/uL (ref 15–500)
Eosinophils Relative: 5.9 %
HCT: 46.9 % (ref 38.5–50.0)
HEMOGLOBIN: 16.1 g/dL (ref 13.2–17.1)
Lymphs Abs: 2042 cells/uL (ref 850–3900)
MCH: 29.9 pg (ref 27.0–33.0)
MCHC: 34.3 g/dL (ref 32.0–36.0)
MCV: 87.2 fL (ref 80.0–100.0)
MPV: 9.5 fL (ref 7.5–12.5)
Monocytes Relative: 6.9 %
NEUTROS ABS: 3456 {cells}/uL (ref 1500–7800)
Neutrophils Relative %: 54 %
PLATELETS: 303 10*3/uL (ref 140–400)
RBC: 5.38 10*6/uL (ref 4.20–5.80)
RDW: 12.2 % (ref 11.0–15.0)
TOTAL LYMPHOCYTE: 31.9 %
WBC: 6.4 10*3/uL (ref 3.8–10.8)
WBCMIX: 442 {cells}/uL (ref 200–950)

## 2017-07-03 LAB — LIPID PANEL
CHOL/HDL RATIO: 5.2 (calc) — AB (ref <5.0)
Cholesterol: 152 mg/dL (ref <200)
HDL: 29 mg/dL — ABNORMAL LOW (ref >40)
LDL Cholesterol (Calc): 96 mg/dL (calc)
NON-HDL CHOLESTEROL (CALC): 123 mg/dL (ref <130)
Triglycerides: 162 mg/dL — ABNORMAL HIGH (ref <150)

## 2017-07-03 LAB — HEMOGLOBIN A1C
Hgb A1c MFr Bld: 10.1 % of total Hgb — ABNORMAL HIGH (ref <5.7)
Mean Plasma Glucose: 243 (calc)
eAG (mmol/L): 13.5 (calc)

## 2017-07-07 ENCOUNTER — Encounter (INDEPENDENT_AMBULATORY_CARE_PROVIDER_SITE_OTHER): Payer: Self-pay

## 2017-07-08 ENCOUNTER — Encounter: Payer: Self-pay | Admitting: Family Medicine

## 2017-07-08 ENCOUNTER — Ambulatory Visit: Payer: BC Managed Care – PPO | Admitting: Family Medicine

## 2017-07-08 VITALS — BP 120/70 | HR 96 | Temp 98.0°F | Resp 18 | Ht 73.5 in | Wt 266.8 lb

## 2017-07-08 DIAGNOSIS — J209 Acute bronchitis, unspecified: Secondary | ICD-10-CM | POA: Diagnosis not present

## 2017-07-08 DIAGNOSIS — J329 Chronic sinusitis, unspecified: Secondary | ICD-10-CM

## 2017-07-08 MED ORDER — IPRATROPIUM-ALBUTEROL 0.5-2.5 (3) MG/3ML IN SOLN
3.0000 mL | Freq: Once | RESPIRATORY_TRACT | Status: AC
  Administered 2017-07-08: 3 mL via RESPIRATORY_TRACT

## 2017-07-08 MED ORDER — MONTELUKAST SODIUM 10 MG PO TABS: 10.0000 mg | ORAL_TABLET | Freq: Every day | ORAL | 1 refills | Status: DC

## 2017-07-08 MED ORDER — DOXYCYCLINE HYCLATE 100 MG PO TABS: 100.0000 mg | ORAL_TABLET | Freq: Two times a day (BID) | ORAL | 0 refills | Status: DC

## 2017-07-08 MED ORDER — AZITHROMYCIN 250 MG PO TABS: ORAL_TABLET | ORAL | 0 refills | Status: DC

## 2017-07-08 MED ORDER — BENZONATATE 100 MG PO CAPS: 100.0000 mg | ORAL_CAPSULE | Freq: Three times a day (TID) | ORAL | 0 refills | Status: DC | PRN

## 2017-07-08 MED ORDER — METHYLPREDNISOLONE ACETATE 80 MG/ML IJ SUSP
80.0000 mg | Freq: Once | INTRAMUSCULAR | Status: AC
  Administered 2017-07-08: 80 mg via INTRAMUSCULAR

## 2017-07-08 MED ORDER — PREDNISONE 20 MG PO TABS: ORAL_TABLET | ORAL | 0 refills | Status: DC

## 2017-07-08 NOTE — Progress Notes (Signed)
Patient ID: Matthew Osborne, male    DOB: 08/14/1975, 41 y.o.   MRN: [id number]  PCP: Matthew Frizzle, MD  Chief Complaint  Patient presents with  . Cough    follow up     Subjective:   Matthew Osborne is a 42 y.o. male, presents to clinic with CC of 5 days of nasal congestion, nasal drainage, sore throat, ear pressure, chest congestion with productive cough with green to gray sputum.  Currently most severe symptoms are most concerning to him is cough, chest tightness, wheezy feeling, difficulty breathing because of severe congestion in his nose.  He has had associated subjective fever, chills and fatigue.  He is a former smoker, reports getting bronchitis and needing albuterol 1-2 times per year.  Since onset of symptoms he feels it is gradually worsened.  He has used allergy medications, nasal sprays, Sudafed, Mucinex and has noted no improvement.  He denies night sweats, weight loss, exertional dyspnea, abdominal pain, back pain, chest pain, nausea, vomiting, diarrhea, rash.    He also request to do his lab work for his other chronic health conditions which are managed by Dr. Dennard Schaumann, he also requests refill for his home meds.   Patient Active Problem List   Diagnosis Date Noted  . Allergic rhinitis 06/17/2014  . Sinus congestion 05/27/2014  . Chronic cough 02/14/2014  . Essential hypertension, benign 06/30/2013  . Hyperlipidemia 06/30/2013  . Diabetes ([place]) 06/30/2013     Prior to Admission medications   Medication Sig Start Date End Date Taking? Authorizing Provider  aspirin 81 MG tablet Take 81 mg by mouth daily.   Yes [provider]  atorvastatin (LIPITOR) 40 MG tablet TAKE 1 TABLET BY MOUTH EVERY DAY 07/02/17  Yes Matthew Frizzle, MD  bisoprolol-hydrochlorothiazide [facility]) 10-6.25 MG tablet TAKE 1 TABLET BY MOUTH EVERY DAY IN THE MORNING 07/02/17  Yes Matthew Frizzle, MD  blood glucose meter kit and supplies KIT Dispense based on patient and insurance preference.  Use twice daily times daily as directed. (FOR ICD-10  E11.65) 09/21/15  Yes Matthew Frizzle, MD  Choline Fenofibrate (FENOFIBRIC ACID) 135 MG CPDR TAKE 1 CAPSULE BY MOUTH EVERY EVENING. 12/04/16  Yes Matthew Frizzle, MD  glipiZIDE (GLUCOTROL XL) 10 MG 24 hr tablet TAKE 1 TABLET BY MOUTH EVERY DAY 07/02/17  Yes Matthew Frizzle, MD  glucose blood test strip Dispense based on patient and insurance preference.  Use twice daily as directed. (FOR ICD-10 E11.65) 11/03/14  Yes Pickard, Cammie Mcgee, MD  JANUVIA 100 MG tablet TAKE 1 TABLET BY MOUTH ONCE DAILY. PATIENT NEEDS APPOINTMENT FOR REFILLS 07/02/17  Yes Matthew Frizzle, MD  metFORMIN (GLUCOPHAGE-XR) 500 MG 24 hr tablet TAKE 4 TABLETS WITH BREAKFAST 07/02/17  Yes Matthew Frizzle, MD  TRUEPLUS LANCETS 33G MISC 1 each by Does not apply route 2 (two) times daily. 07/06/13  Yes Matthew Frizzle, MD  albuterol (PROVENTIL HFA;VENTOLIN HFA) 108 (90 Base) MCG/ACT inhaler Inhale 2 puffs into the lungs every 4 (four) hours as needed for wheezing or shortness of breath. 07/02/17   Delsa Grana, PA-C  benzonatate (TESSALON) 100 MG capsule Take 1 capsule (100 mg total) by mouth 3 (three) times daily as needed for cough. 07/02/17   Delsa Grana, PA-C  HYDROcodone-homatropine (HYCODAN) 5-1.5 MG/5ML syrup Take 5 mLs by mouth every 8 (eight) hours as needed for cough. 07/02/17   Delsa Grana, PA-C  predniSONE (DELTASONE) 20 MG tablet Take 2 tablets (40  mg total) by mouth daily with breakfast for 5 days. 07/02/17 07/07/17  Delsa Grana, PA-C     Allergies  Allergen Reactions  . Niaspan [Niacin] Rash     Family History  Problem Relation Age of Onset  . Cancer Other   . Hyperlipidemia Other   . Hypertension Brother   . Asthma Son   . Lung cancer Maternal Grandfather   . Rheum arthritis Maternal Grandmother      Social History   Socioeconomic History  . Marital status: Married    Spouse name: Not on file  . Number of children: Not on file  . Years of  education: Not on file  . Highest education level: Not on file  Occupational History  . Not on file  Social Needs  . Financial resource strain: Not on file  . Food insecurity:    Worry: Not on file    Inability: Not on file  . Transportation needs:    Medical: Not on file    Non-medical: Not on file  Tobacco Use  . Smoking status: Former Smoker    Packs/day: 3.00    Years: 6.00    Pack years: 18.00    Types: Cigarettes    Last attempt to quit: 02/05/1999    Years since quitting: 18.4  . Smokeless tobacco: Never Used  Substance and Sexual Activity  . Alcohol use: No    Alcohol/week: 0.0 oz  . Drug use: No  . Sexual activity: Not on file  Lifestyle  . Physical activity:    Days per week: Not on file    Minutes per session: Not on file  . Stress: Not on file  Relationships  . Social connections:    Talks on phone: Not on file    Gets together: Not on file    Attends religious service: Not on file    Active member of club or organization: Not on file    Attends meetings of clubs or organizations: Not on file    Relationship status: Not on file  . Intimate partner violence:    Fear of current or ex partner: Not on file    Emotionally abused: Not on file    Physically abused: Not on file    Forced sexual activity: Not on file  Other Topics Concern  . Not on file  Social History Narrative  . Not on file     Review of Systems  Constitutional: Positive for chills. Negative for activity change, appetite change, diaphoresis, fatigue, fever and unexpected weight change.  HENT: Positive for congestion, postnasal drip, rhinorrhea, sinus pressure, sore throat and voice change. Negative for dental problem, drooling, ear discharge, ear pain, facial swelling, hearing loss, mouth sores, nosebleeds, sinus pain, sneezing, tinnitus and trouble swallowing.   Eyes: Negative.   Respiratory: Positive for cough, chest tightness and wheezing. Negative for apnea, choking and stridor.     Cardiovascular: Negative.  Negative for chest pain, palpitations and leg swelling.  Gastrointestinal: Negative.  Negative for abdominal distention, abdominal pain, constipation, diarrhea, nausea and vomiting.  Endocrine: Negative.  Negative for polydipsia, polyphagia and polyuria.  Genitourinary: Negative.   Musculoskeletal: Negative.  Negative for arthralgias and myalgias.  Skin: Negative.  Negative for color change, pallor, rash and wound.  Allergic/Immunologic: Positive for environmental allergies. Negative for food allergies.  Neurological: Negative.  Negative for dizziness, syncope, facial asymmetry, weakness, light-headedness, numbness and headaches.  Hematological: Negative.   All other systems reviewed and are negative.  Objective:    Vitals:   07/08/17 1547  BP: 120/70  Pulse: 96  Resp: 18  Temp: 98 F (36.7 C)  TempSrc: Oral  SpO2: 98%  Weight: 266 lb 12.8 oz (121 kg)  Height: 6' 1.5" (1.867 m)      Physical Exam  Constitutional: He is oriented to person, place, and time. He appears well-developed and well-nourished.  Non-toxic appearance. He does not appear ill. No distress. He is not intubated.  Obese adult male, appears mildly ill, nontoxic-appearing, severe coughing fits but no distress, appears stated age  HENT:  Head: Normocephalic and atraumatic.  Right Ear: Tympanic membrane, external ear and ear canal normal. Tympanic membrane is not erythematous. No middle ear effusion.  Left Ear: Tympanic membrane, external ear and ear canal normal. Tympanic membrane is not erythematous.  No middle ear effusion.  Nose: Mucosal edema and rhinorrhea present. Right sinus exhibits no maxillary sinus tenderness and no frontal sinus tenderness. Left sinus exhibits no maxillary sinus tenderness and no frontal sinus tenderness.  Mouth/Throat: Uvula is midline and mucous membranes are normal. Mucous membranes are not pale, not dry and not cyanotic. No trismus in the jaw. No  uvula swelling. Posterior oropharyngeal edema present. No oropharyngeal exudate, posterior oropharyngeal erythema or tonsillar abscesses. Tonsils are 1+ on the right. Tonsils are 1+ on the left. No tonsillar exudate.  Visible postnasal drip  Eyes: Pupils are equal, round, and reactive to light. Conjunctivae, EOM and lids are normal. Right eye exhibits no discharge and no exudate. Left eye exhibits no discharge and no exudate. Right conjunctiva is not injected. Left conjunctiva is not injected.  Neck: Trachea normal, normal range of motion, full passive range of motion without pain and phonation normal. Neck supple. No tracheal deviation present.  Cardiovascular: Regular rhythm, normal heart sounds and normal pulses.  No extrasystoles are present. PMI is not displaced. Exam reveals no gallop and no friction rub.  No murmur heard. Pulses:      Radial pulses are 2+ on the right side, and 2+ on the left side.       Posterior tibial pulses are 2+ on the right side, and 2+ on the left side.  No lower extremity edema  Pulmonary/Chest: Effort normal. No accessory muscle usage or stridor. He is not intubated. No respiratory distress. He has no decreased breath sounds. He has wheezes. He has no rhonchi. He has no rales.  Frequent severe coughing fits, worse than prior visit Diminished breath sounds at the right lower lung fields with faint wheezes and scattered rhonchi  Abdominal: Soft. Normal appearance and bowel sounds are normal. He exhibits no distension. There is no tenderness. There is no rebound and no guarding.  Musculoskeletal: Normal range of motion. He exhibits no edema.  Lymphadenopathy:       Head (right side): No submental, no submandibular and no tonsillar adenopathy present.       Head (left side): No submental, no submandibular and no tonsillar adenopathy present.    He has no cervical adenopathy.  Neurological: He is alert and oriented to person, place, and time. He exhibits normal muscle  tone. Coordination and gait normal.  Skin: Skin is warm and intact. Capillary refill takes less than 2 seconds. No rash noted. He is not diaphoretic.  Psychiatric: He has a normal mood and affect. His speech is normal and behavior is normal.  Nursing note and vitals reviewed.         Assessment & Plan:  ICD-10-CM   1. Bronchitis, acute, with bronchospasm J20.9 methylPREDNISolone acetate (DEPO-MEDROL) injection 80 mg    ipratropium-albuterol (DUONEB) 0.5-2.5 (3) MG/3ML nebulizer solution 3 mL    benzonatate (TESSALON) 100 MG capsule    predniSONE (DELTASONE) 20 MG tablet    doxycycline (VIBRA-TABS) 100 MG tablet   improved briefly for 2-3 days then worsened, esp with exercise and warm weather, has severe wheezy cough  2. Sinusitis, unspecified chronicity, unspecified location J32.9 montelukast (SINGULAIR) 10 MG tablet    doxycycline (VIBRA-TABS) 100 MG tablet   Breath sounds worsened relative to his last visit.  He did take steroids and felt better for a few days and then worsened.  He is using the inhaler frequently.  Tessalon helps with his cough but he ran out very quickly.  He does feel worse than before.  He did not go get the chest x-ray done as instructed.  Continues to have nasal congestion.  What has changed since his last visit is that he endorses what sounds like bronchospasm when he goes to exercise or when he goes out to the warm and humid weather.  Discussed using albuterol prior to exercise and prior to going outside.  +/- benefits of repeated steroid burst.  Will give IM shot and then a taper.  Of note he did have routine labs drawn after last visit, as instructed by his PCP, it was significant for increased A1C and increased cholesterol, pt denies any nocturia, urinary frequency, polydipsia, weight loss, fatigue.  To be addressed with his regularly scheduled visit.  F/U as needed for acute issues     Delsa Grana, PA-C 07/13/17 10:53 PM

## 2017-07-08 NOTE — Progress Notes (Signed)
Patient was in office for depo medrol injection. Patient received injection in left ventrogluteal.Patient tolerated well

## 2017-07-14 ENCOUNTER — Encounter: Payer: Self-pay | Admitting: Family Medicine

## 2017-07-14 ENCOUNTER — Ambulatory Visit: Payer: BC Managed Care – PPO | Admitting: Family Medicine

## 2017-07-14 VITALS — BP 130/90 | HR 70 | Temp 98.3°F | Resp 16 | Ht 73.5 in | Wt 263.0 lb

## 2017-07-14 DIAGNOSIS — E1165 Type 2 diabetes mellitus with hyperglycemia: Secondary | ICD-10-CM | POA: Diagnosis not present

## 2017-07-14 DIAGNOSIS — IMO0002 Reserved for concepts with insufficient information to code with codable children: Secondary | ICD-10-CM

## 2017-07-14 DIAGNOSIS — I10 Essential (primary) hypertension: Secondary | ICD-10-CM | POA: Diagnosis not present

## 2017-07-14 DIAGNOSIS — E7800 Pure hypercholesterolemia, unspecified: Secondary | ICD-10-CM | POA: Diagnosis not present

## 2017-07-14 DIAGNOSIS — E118 Type 2 diabetes mellitus with unspecified complications: Secondary | ICD-10-CM

## 2017-07-14 NOTE — Progress Notes (Signed)
Subjective:    Patient ID: Matthew Osborne, male    DOB: 10-10-1975, 42 y.o.   MRN: [id number]  Medication Refill   11/01/16  Patient is an uncontrolled type II diabetic who has not been seen since November 2017. At that time his hemoglobin A 1C was 8.8. He presents today with 2 months of fatigue, weight loss, anxiety, mood swings, trouble sleeping. Today he is on the verge of tears while talking to me. He does report increasing crying spells. He denies any chest pain. He denies any shortness of breath. He denies any dyspnea on exertion. He denies any cough. He denies any hemoptysis. He denies any fevers or chills or night sweats. He does report decreasing appetite, anhedonia, daily tension headaches, and insomnia. He denies any recent illness. He denies any melena or hematochezia. He denies any nausea or vomiting. He denies any dysuria or hematuria.  At that time, my plan was: I truly believe that the fatigue, the mood swings, trouble sleeping, are related to anxiety and stress. However given the weight loss and the other somatic symptoms, I believe we need to do a more thorough diagnostic workup. Physical exam today is relatively normal aside from continued weight loss which is unintentional. Therefore I will check a CBC, CMP, fasting lipid panel, TSH, hemoglobin A1c, microalbumin, total/free  testosterone level.  I will have the patient return Monday to review these lab results. If lab workup is essentially normal, I will turn our focus to treating anxiety and stress possibly with an SSRI. Obviously if labs revealed severe abnormalities, we will treat these as best we can. Given weight loss, consider a GI referral for an EGD and possibly colonoscopy if lab workup is unremarkable.  11/04/16 Labs were significant for uncontrolled diabetes but were otherwise all within normal limits including his testosterone and TSH.  My suspicion Friday was that the patient was suffering from anxiety.  Regarding his A1c,  we discussed victoza/trulicity but we both agreed to table this discussion until we have ruled out all other medical causes. After the fact, I thought that the patient would benefit from checking stool FIT x 3 to rule out an occult GI bleed or other evidence of GI malignancy as well as a sedimentation rate to evaluate for autoimmune diseases. He would like to proceed with this as well.  At that time, my plan was: Check stool for fecal occult blood 3 as well as a sedimentation rate. If these tests are normal, I believe the patient's issues are related to situational anxiety. We could then discuss options to manage situational anxiety but the patient is hesitant to pursue any medication at this time. I would recommend either victoza or trulicity to manage diabetes once he is comfortable that we have excluded other medical causes of his symptoms. I am concerned that the medication may cause nausea and that would contribute to anxiety until we have ruled out/exhausted other medical possibilities. I discussed this with him at length today and he is comfortable with this plan  07/14/17 Patient's hemoglobin A1c was found to be greater than 10 with an average sugar between 260 and 270.  He is here today to discuss.  His most recent lab work as listed below: Office Visit on 07/02/2017  Component Date Value Ref Range Status  . WBC 07/02/2017 6.4  3.8 - 10.8 Thousand/uL Final  . RBC 07/02/2017 5.38  4.20 - 5.80 Million/uL Final  . Hemoglobin 07/02/2017 16.1  13.2 - 17.1 g/dL Final  .  HCT 07/02/2017 46.9  38.5 - 50.0 % Final  . MCV 07/02/2017 87.2  80.0 - 100.0 fL Final  . MCH 07/02/2017 29.9  27.0 - 33.0 pg Final  . MCHC 07/02/2017 34.3  32.0 - 36.0 g/dL Final  . RDW 07/02/2017 12.2  11.0 - 15.0 % Final  . Platelets 07/02/2017 303  140 - 400 Thousand/uL Final  . MPV 07/02/2017 9.5  7.5 - 12.5 fL Final  . Neutro Abs 07/02/2017 3,456  1,500 - 7,800 cells/uL Final  . Lymphs Abs 07/02/2017 2,042  850 - 3,900  cells/uL Final  . WBC mixed population 07/02/2017 442  200 - 950 cells/uL Final  . Eosinophils Absolute 07/02/2017 378  15 - 500 cells/uL Final  . Basophils Absolute 07/02/2017 83  0 - 200 cells/uL Final  . Neutrophils Relative % 07/02/2017 54  % Final  . Total Lymphocyte 07/02/2017 31.9  % Final  . Monocytes Relative 07/02/2017 6.9  % Final  . Eosinophils Relative 07/02/2017 5.9  % Final  . Basophils Relative 07/02/2017 1.3  % Final  . Glucose, Bld 07/02/2017 265* 65 - 99 mg/dL Final   Comment: .            Fasting reference interval . For someone without known diabetes, a glucose value >125 mg/dL indicates that they may have diabetes and this should be confirmed with a follow-up test. .   . BUN 07/02/2017 9  7 - 25 mg/dL Final  . Creat 07/02/2017 0.90  0.60 - 1.35 mg/dL Final  . BUN/Creatinine Ratio 18/29/9371 NOT APPLICABLE  6 - 22 (calc) Final  . Sodium 07/02/2017 134* 135 - 146 mmol/L Final  . Potassium 07/02/2017 4.5  3.5 - 5.3 mmol/L Final  . Chloride 07/02/2017 99  98 - 110 mmol/L Final  . CO2 07/02/2017 25  20 - 32 mmol/L Final  . Calcium 07/02/2017 9.2  8.6 - 10.3 mg/dL Final  . Total Protein 07/02/2017 7.4  6.1 - 8.1 g/dL Final  . Albumin 07/02/2017 4.4  3.6 - 5.1 g/dL Final  . Globulin 07/02/2017 3.0  1.9 - 3.7 g/dL (calc) Final  . AG Ratio 07/02/2017 1.5  1.0 - 2.5 (calc) Final  . Total Bilirubin 07/02/2017 0.5  0.2 - 1.2 mg/dL Final  . Alkaline phosphatase (APISO) 07/02/2017 83  40 - 115 U/L Final  . AST 07/02/2017 32  10 - 40 U/L Final  . ALT 07/02/2017 47* 9 - 46 U/L Final  . Cholesterol 07/02/2017 152  <200 mg/dL Final  . HDL 07/02/2017 29* >40 mg/dL Final  . Triglycerides 07/02/2017 162* <150 mg/dL Final  . LDL Cholesterol (Calc) 07/02/2017 96  mg/dL (calc) Final   Comment: Reference range: <100 . Desirable range <100 mg/dL for primary prevention;   <70 mg/dL for patients with CHD or diabetic patients  with > or = 2 CHD risk factors. [name] LDL-C is now  calculated using the Martin-Hopkins  calculation, which is a validated novel method providing  better accuracy than the Friedewald equation in the  estimation of LDL-C.  Matthew Osborne Genre et al. Annamaria Helling. 6967;893(81): 2061-2068  (http://education.[facility].com/faq/FAQ164)   . Total CHOL/HDL Ratio 07/02/2017 5.2* <5.0 (calc) Final  . Non-HDL Cholesterol (Calc) 07/02/2017 123  <130 mg/dL (calc) Final   Comment: For patients with diabetes plus 1 major ASCVD risk  factor, treating to a non-HDL-C goal of <100 mg/dL  (LDL-C of <70 mg/dL) is considered a therapeutic  option.   . Hgb A1c MFr Bld 07/02/2017 10.1* <5.7 %  of total Hgb Final   Comment: For someone without known diabetes, a hemoglobin A1c value of 6.5% or greater indicates that they may have  diabetes and this should be confirmed with a follow-up  test. . For someone with known diabetes, a value <7% indicates  that their diabetes is well controlled and a value  greater than or equal to 7% indicates suboptimal  control. A1c targets should be individualized based on  duration of diabetes, age, comorbid conditions, and  other considerations. . Currently, no consensus exists regarding use of hemoglobin A1c for diagnosis of diabetes for children. .   . Mean Plasma Glucose 07/02/2017 243  (calc) Final  . eAG (mmol/L) 07/02/2017 13.5  (calc) Final   Regarding his diabetes, he is on a combination of glipizide, metformin, and Januvia.  He admits that he frequently forgets his medication.  He states that 3 to 4 days a week, he will forget to take certain pills however he cannot be specific about which pills he does and does not take.  He states that whenever he develops polyuria or blurry vision, he will start to take his medications the way he supposed to.  He will do this for a while and then get complacent and stop again.  He would assume that he takes the medication 50% of the time.  He denies any chest pain, shortness of breath, dyspnea  on exertion, myalgias, right upper quadrant pain, neuropathy in his feet.  Past Medical History:  Diagnosis Date  . De Quervain's tenosynovitis, left 03/2011  . Diabetes mellitus    NIDDM  . Hyperlipidemia   . Hypertension    under control; has been on med. x 4 yrs.   Past Surgical History:  Procedure Laterality Date  . CARPAL TUNNEL RELEASE     bilat.  . DORSAL COMPARTMENT RELEASE  03/14/2011   Procedure: RELEASE DORSAL COMPARTMENT (DEQUERVAIN);  Surgeon: Paulene Floor, MD;  Location: [facility];  Service: Orthopedics;  Laterality: Left;  left wrist APL and EPL tenosynovectomy and 1st dorsal compartment release  . TONSILLECTOMY AND ADENOIDECTOMY  as a child  . VASECTOMY     Current Outpatient Medications on File Prior to Visit  Medication Sig Dispense Refill  . albuterol (PROVENTIL HFA;VENTOLIN HFA) 108 (90 Base) MCG/ACT inhaler Inhale 2 puffs into the lungs every 4 (four) hours as needed for wheezing or shortness of breath. 1 Inhaler 0  . aspirin 81 MG tablet Take 81 mg by mouth daily.    [name] atorvastatin (LIPITOR) 40 MG tablet TAKE 1 TABLET BY MOUTH EVERY DAY 90 tablet 0  . benzonatate (TESSALON) 100 MG capsule Take 1 capsule (100 mg total) by mouth 3 (three) times daily as needed for cough. 30 capsule 0  . bisoprolol-hydrochlorothiazide (ZIAC) 10-6.25 MG tablet TAKE 1 TABLET BY MOUTH EVERY DAY IN THE MORNING 90 tablet 0  . blood glucose meter kit and supplies KIT Dispense based on patient and insurance preference. Use twice daily times daily as directed. (FOR ICD-10  E11.65) 1 each 0  . Choline Fenofibrate (FENOFIBRIC ACID) 135 MG CPDR TAKE 1 CAPSULE BY MOUTH EVERY EVENING. 90 capsule 1  . doxycycline (VIBRA-TABS) 100 MG tablet Take 1 tablet (100 mg total) by mouth 2 (two) times daily. 20 tablet 0  . glipiZIDE (GLUCOTROL XL) 10 MG 24 hr tablet TAKE 1 TABLET BY MOUTH EVERY DAY 90 tablet 0  . glucose blood test strip Dispense based on patient and insurance  preference.  Use twice daily as directed. (FOR ICD-10 E11.65) 100 each 11  . JANUVIA 100 MG tablet TAKE 1 TABLET BY MOUTH ONCE DAILY. PATIENT NEEDS APPOINTMENT FOR REFILLS 90 tablet 0  . metFORMIN (GLUCOPHAGE-XR) 500 MG 24 hr tablet TAKE 4 TABLETS WITH BREAKFAST 360 tablet 0  . montelukast (SINGULAIR) 10 MG tablet Take 1 tablet (10 mg total) by mouth at bedtime. 30 tablet 1  . TRUEPLUS LANCETS 33G MISC 1 each by Does not apply route 2 (two) times daily. 100 each 11   No current facility-administered medications on file prior to visit.    Allergies  Allergen Reactions  . Niaspan [Niacin] Rash   Social History   Socioeconomic History  . Marital status: Married    Spouse name: Not on file  . Number of children: Not on file  . Years of education: Not on file  . Highest education level: Not on file  Occupational History  . Not on file  Social Needs  . Financial resource strain: Not on file  . Food insecurity:    Worry: Not on file    Inability: Not on file  . Transportation needs:    Medical: Not on file    Non-medical: Not on file  Tobacco Use  . Smoking status: Former Smoker    Packs/day: 3.00    Years: 6.00    Pack years: 18.00    Types: Cigarettes    Last attempt to quit: 02/05/1999    Years since quitting: 18.4  . Smokeless tobacco: Never Used  Substance and Sexual Activity  . Alcohol use: No    Alcohol/week: 0.0 oz  . Drug use: No  . Sexual activity: Not on file  Lifestyle  . Physical activity:    Days per week: Not on file    Minutes per session: Not on file  . Stress: Not on file  Relationships  . Social connections:    Talks on phone: Not on file    Gets together: Not on file    Attends religious service: Not on file    Active member of club or organization: Not on file    Attends meetings of clubs or organizations: Not on file    Relationship status: Not on file  . Intimate partner violence:    Fear of current or ex partner: Not on file    Emotionally  abused: Not on file    Physically abused: Not on file    Forced sexual activity: Not on file  Other Topics Concern  . Not on file  Social History Narrative  . Not on file     Review of Systems  All other systems reviewed and are negative.       Objective:   Physical Exam  Constitutional: He appears well-developed and well-nourished.  HENT:  Head: Normocephalic and atraumatic.  Cardiovascular: Normal rate, regular rhythm and normal heart sounds.  Pulmonary/Chest: Effort normal and breath sounds normal. No respiratory distress. He has no wheezes. He has no rales. He exhibits no tenderness.  Abdominal: Soft. Bowel sounds are normal.  Musculoskeletal: He exhibits no edema.  Neurological: He is alert.  Vitals reviewed.         Assessment & Plan:  Diabetes mellitus type 2, uncontrolled, with complications ([place])  Essential hypertension, benign  Pure hypercholesterolemia Very little exam was performed today.  Instead I spent 25 minutes today with the patient discussing his lab work, and "reading him the riot act".  I explained that although he currently  is having no side effects or symptoms of diabetes, he will eventually likely suffer from major complications such as peripheral vascular disease, amputation, kidney disease, heart disease, stroke.  I recommended that we add Trulicity 0.67 mg injected weekly (given his history of noncompliance) in addition to his other medication.  Reassess in 3 weeks and increase to 1.5 mg at that time if he is tolerating the medication well.  If he starts to experience hypoglycemia, I would discontinue glipizide.

## 2017-07-15 ENCOUNTER — Ambulatory Visit (HOSPITAL_COMMUNITY)
Admission: RE | Admit: 2017-07-15 | Discharge: 2017-07-15 | Disposition: A | Discharge status: Home / Self Care | Payer: BC Managed Care – PPO | Source: Other Acute Inpatient Hospital | Attending: Family Medicine | Admitting: Family Medicine

## 2017-07-15 ENCOUNTER — Ambulatory Visit (HOSPITAL_COMMUNITY)
Admission: RE | Admit: 2017-07-15 | Discharge: 2017-07-15 | Disposition: A | Discharge status: Home / Self Care | Payer: BC Managed Care – PPO | Source: Ambulatory Visit | Attending: Family Medicine | Admitting: Family Medicine

## 2017-07-15 DIAGNOSIS — R05 Cough: Secondary | ICD-10-CM | POA: Diagnosis not present

## 2017-07-16 NOTE — Progress Notes (Signed)
Sorry shannon, CXR was actually ordered the first time I saw him the end of may.   Please call pt and notify that CXR is negative - how is he doing? CXR was ordered on 6/4 and he just got it today 6/12? So I suspect he's not doing much better? Please LMK how he's doing or what his concerns are Can review results as follows: CXR is negative for bronchitis changes, pneumonia or any cardiac abnormality (like enlarged heart). This makes bronchospasm more likely. If inhaler isn't helping, I would follow up with PCP to be rechecked. If he has constant pain now after coughing for several weeks, it could be costcochondritis or pleurisy, or may be something more concerning, so he should come be rechecked if having new or different chest pain.

## 2017-07-23 ENCOUNTER — Encounter: Payer: Self-pay | Admitting: Family Medicine

## 2017-07-23 MED ORDER — DULAGLUTIDE 1.5 MG/0.5ML ~~LOC~~ SOAJ: 1.5000 mg | SUBCUTANEOUS | 2 refills | Status: DC

## 2017-07-27 ENCOUNTER — Other Ambulatory Visit: Payer: Self-pay | Admitting: Family Medicine

## 2017-08-05 ENCOUNTER — Other Ambulatory Visit: Payer: Self-pay

## 2017-08-05 DIAGNOSIS — J329 Chronic sinusitis, unspecified: Secondary | ICD-10-CM

## 2017-08-05 MED ORDER — MONTELUKAST SODIUM 10 MG PO TABS: 10.0000 mg | ORAL_TABLET | Freq: Every day | ORAL | 3 refills | Status: DC

## 2017-08-29 ENCOUNTER — Ambulatory Visit (INDEPENDENT_AMBULATORY_CARE_PROVIDER_SITE_OTHER): Payer: BC Managed Care – PPO | Admitting: Family Medicine

## 2017-08-29 ENCOUNTER — Encounter: Payer: Self-pay | Admitting: Family Medicine

## 2017-08-29 VITALS — BP 110/72 | HR 80 | Temp 98.0°F | Resp 16 | Ht 73.5 in | Wt 256.0 lb

## 2017-08-29 DIAGNOSIS — E785 Hyperlipidemia, unspecified: Secondary | ICD-10-CM

## 2017-08-29 DIAGNOSIS — E118 Type 2 diabetes mellitus with unspecified complications: Secondary | ICD-10-CM

## 2017-08-29 DIAGNOSIS — IMO0002 Reserved for concepts with insufficient information to code with codable children: Secondary | ICD-10-CM

## 2017-08-29 DIAGNOSIS — R591 Generalized enlarged lymph nodes: Secondary | ICD-10-CM | POA: Diagnosis not present

## 2017-08-29 DIAGNOSIS — I10 Essential (primary) hypertension: Secondary | ICD-10-CM

## 2017-08-29 DIAGNOSIS — IMO0001 Reserved for inherently not codable concepts without codable children: Secondary | ICD-10-CM

## 2017-08-29 DIAGNOSIS — E1165 Type 2 diabetes mellitus with hyperglycemia: Secondary | ICD-10-CM

## 2017-08-29 LAB — CBC WITH DIFFERENTIAL/PLATELET
Basophils Absolute: 62 cells/uL (ref 0–200)
Basophils Relative: 0.9 %
EOS ABS: 179 {cells}/uL (ref 15–500)
Eosinophils Relative: 2.6 %
HEMATOCRIT: 46.4 % (ref 38.5–50.0)
HEMOGLOBIN: 15.8 g/dL (ref 13.2–17.1)
Lymphs Abs: 1622 cells/uL (ref 850–3900)
MCH: 30.3 pg (ref 27.0–33.0)
MCHC: 34.1 g/dL (ref 32.0–36.0)
MCV: 89.1 fL (ref 80.0–100.0)
MPV: 9.7 fL (ref 7.5–12.5)
Monocytes Relative: 6.2 %
NEUTROS ABS: 4609 {cells}/uL (ref 1500–7800)
Neutrophils Relative %: 66.8 %
Platelets: 299 10*3/uL (ref 140–400)
RBC: 5.21 10*6/uL (ref 4.20–5.80)
RDW: 12.6 % (ref 11.0–15.0)
Total Lymphocyte: 23.5 %
WBC mixed population: 428 cells/uL (ref 200–950)
WBC: 6.9 10*3/uL (ref 3.8–10.8)

## 2017-08-29 LAB — SEDIMENTATION RATE: Sed Rate: 2 mm/h (ref 0–15)

## 2017-08-29 MED ORDER — LANCETS THIN MISC: 5 refills | Status: AC

## 2017-08-29 MED ORDER — BLOOD GLUCOSE MONITOR KIT: PACK | 0 refills | Status: DC

## 2017-08-29 MED ORDER — GLUCOSE BLOOD VI STRP: ORAL_STRIP | 11 refills | Status: AC

## 2017-08-29 NOTE — Progress Notes (Signed)
Subjective:    Patient ID: Matthew Osborne, male    DOB: 1975/04/30, 42 y.o.   MRN: [id number]  Medication Refill    07/14/17 Patient's hemoglobin A1c was found to be greater than 10 with an average sugar between 260 and 270.  He is here today to discuss.  His most recent lab work as listed below: No visits with results within 1 Month(s) from this visit.  Latest known visit with results is:  Office Visit on 07/02/2017  Component Date Value Ref Range Status  . WBC 07/02/2017 6.4  3.8 - 10.8 Thousand/uL Final  . RBC 07/02/2017 5.38  4.20 - 5.80 Million/uL Final  . Hemoglobin 07/02/2017 16.1  13.2 - 17.1 g/dL Final  . HCT 07/02/2017 46.9  38.5 - 50.0 % Final  . MCV 07/02/2017 87.2  80.0 - 100.0 fL Final  . MCH 07/02/2017 29.9  27.0 - 33.0 pg Final  . MCHC 07/02/2017 34.3  32.0 - 36.0 g/dL Final  . RDW 07/02/2017 12.2  11.0 - 15.0 % Final  . Platelets 07/02/2017 303  140 - 400 Thousand/uL Final  . MPV 07/02/2017 9.5  7.5 - 12.5 fL Final  . Neutro Abs 07/02/2017 3,456  1,500 - 7,800 cells/uL Final  . Lymphs Abs 07/02/2017 2,042  850 - 3,900 cells/uL Final  . WBC mixed population 07/02/2017 442  200 - 950 cells/uL Final  . Eosinophils Absolute 07/02/2017 378  15 - 500 cells/uL Final  . Basophils Absolute 07/02/2017 83  0 - 200 cells/uL Final  . Neutrophils Relative % 07/02/2017 54  % Final  . Total Lymphocyte 07/02/2017 31.9  % Final  . Monocytes Relative 07/02/2017 6.9  % Final  . Eosinophils Relative 07/02/2017 5.9  % Final  . Basophils Relative 07/02/2017 1.3  % Final  . Glucose, Bld 07/02/2017 265* 65 - 99 mg/dL Final   Comment: .            Fasting reference interval . For someone without known diabetes, a glucose value >125 mg/dL indicates that they may have diabetes and this should be confirmed with a follow-up test. .   . BUN 07/02/2017 9  7 - 25 mg/dL Final  . Creat 07/02/2017 0.90  0.60 - 1.35 mg/dL Final  . BUN/Creatinine Ratio 03/00/9233 NOT APPLICABLE  6 - 22 (calc)  Final  . Sodium 07/02/2017 134* 135 - 146 mmol/L Final  . Potassium 07/02/2017 4.5  3.5 - 5.3 mmol/L Final  . Chloride 07/02/2017 99  98 - 110 mmol/L Final  . CO2 07/02/2017 25  20 - 32 mmol/L Final  . Calcium 07/02/2017 9.2  8.6 - 10.3 mg/dL Final  . Total Protein 07/02/2017 7.4  6.1 - 8.1 g/dL Final  . Albumin 07/02/2017 4.4  3.6 - 5.1 g/dL Final  . Globulin 07/02/2017 3.0  1.9 - 3.7 g/dL (calc) Final  . AG Ratio 07/02/2017 1.5  1.0 - 2.5 (calc) Final  . Total Bilirubin 07/02/2017 0.5  0.2 - 1.2 mg/dL Final  . Alkaline phosphatase (APISO) 07/02/2017 83  40 - 115 U/L Final  . AST 07/02/2017 32  10 - 40 U/L Final  . ALT 07/02/2017 47* 9 - 46 U/L Final  . Cholesterol 07/02/2017 152  <200 mg/dL Final  . HDL 07/02/2017 29* >40 mg/dL Final  . Triglycerides 07/02/2017 162* <150 mg/dL Final  . LDL Cholesterol (Calc) 07/02/2017 96  mg/dL (calc) Final   Comment: Reference range: <100 . Desirable range <100 mg/dL for primary prevention;   <  70 mg/dL for patients with CHD or diabetic patients  with > or = 2 CHD risk factors. [name] LDL-C is now calculated using the Martin-Hopkins  calculation, which is a validated novel method providing  better accuracy than the Friedewald equation in the  estimation of LDL-C.  [name] Genre et al. Annamaria Helling. 7209;470(96): 2061-2068  (http://education.[facility].com/faq/FAQ164)   . Total CHOL/HDL Ratio 07/02/2017 5.2* <5.0 (calc) Final  . Non-HDL Cholesterol (Calc) 07/02/2017 123  <130 mg/dL (calc) Final   Comment: For patients with diabetes plus 1 major ASCVD risk  factor, treating to a non-HDL-C goal of <100 mg/dL  (LDL-C of <70 mg/dL) is considered a therapeutic  option.   . Hgb A1c MFr Bld 07/02/2017 10.1* <5.7 % of total Hgb Final   Comment: For someone without known diabetes, a hemoglobin A1c value of 6.5% or greater indicates that they may have  diabetes and this should be confirmed with a follow-up  test. . For someone with known diabetes, a value  <7% indicates  that their diabetes is well controlled and a value  greater than or equal to 7% indicates suboptimal  control. A1c targets should be individualized based on  duration of diabetes, age, comorbid conditions, and  other considerations. . Currently, no consensus exists regarding use of hemoglobin A1c for diagnosis of diabetes for children. .   . Mean Plasma Glucose 07/02/2017 243  (calc) Final  . eAG (mmol/L) 07/02/2017 13.5  (calc) Final   Regarding his diabetes, he is on a combination of glipizide, metformin, and Januvia.  He admits that he frequently forgets his medication.  He states that 3 to 4 days a week, he will forget to take certain pills however he cannot be specific about which pills he does and does not take.  He states that whenever he develops polyuria or blurry vision, he will start to take his medications the way he supposed to.  He will do this for a while and then get complacent and stop again.  He would assume that he takes the medication 50% of the time.  He denies any chest pain, shortness of breath, dyspnea on exertion, myalgias, right upper quadrant pain, neuropathy in his feet.  At that time, my plan was: Very little exam was performed today.  Instead I spent 25 minutes today with the patient discussing his lab work, and "reading him the riot act".  I explained that although he currently is having no side effects or symptoms of diabetes, he will eventually likely suffer from major complications such as peripheral vascular disease, amputation, kidney disease, heart disease, stroke.  I recommended that we add Trulicity 2.83 mg injected weekly (given his history of noncompliance) in addition to his other medication.  Reassess in 3 weeks and increase to 1.5 mg at that time if he is tolerating the medication well.  If he starts to experience hypoglycemia, I would discontinue glipizide.  08/29/17 Patient is here today because he is concerned about a lump on the left  side of his neck.  He states that he is been on the Trulicity and taking it religiously since I last saw him.  He does not have any sugars for me to review with him today but he states that his sugars are between 110 and 150.  He is concerned that the Trulicity may cause cancer.  He has a family history of cancer.  Over the last week he has developed a stiff neck.  The stiffness seems to be in the sternocleidomastoid  muscle bilaterally.  He recently palpated a nodule on the anterior border of the sternocleidomastoid muscle just below the angle of the mandible.  Therefore he is concerned that this could be cancer because of the Trulicity.  I explained to him that the odds of MEN and thyroid cancer is extremely low and that the location where he is feeling the nodule is not the thyroid gland however he is still very concerned.  On exam there is a vague nodularity that is approximately 1 cm in diameter although it is difficult to palpate.  I believe this is a benign lymph node in the anterior portion of the sternocleidomastoid muscle there was coincidentally found because of the stiffness in his neck.  I try to reassure the patient as best I can but it is obvious that he is still very concerned.  Past Medical History:  Diagnosis Date  . De Quervain's tenosynovitis, left 03/2011  . Diabetes mellitus    NIDDM  . Hyperlipidemia   . Hypertension    under control; has been on med. x 4 yrs.   Past Surgical History:  Procedure Laterality Date  . CARPAL TUNNEL RELEASE     bilat.  . DORSAL COMPARTMENT RELEASE  03/14/2011   Procedure: RELEASE DORSAL COMPARTMENT (DEQUERVAIN);  Surgeon: Paulene Floor, MD;  Location: [place];  Service: Orthopedics;  Laterality: Left;  left wrist APL and EPL tenosynovectomy and 1st dorsal compartment release  . TONSILLECTOMY AND ADENOIDECTOMY  as a child  . VASECTOMY      Current Outpatient Medications:  .  albuterol (PROVENTIL HFA;VENTOLIN HFA) 108 (90  Base) MCG/ACT inhaler, Inhale 2 puffs into the lungs every 4 (four) hours as needed for wheezing or shortness of breath., Disp: 1 Inhaler, Rfl: 0 .  aspirin 81 MG tablet, Take 81 mg by mouth daily., Disp: , Rfl:  .  atorvastatin (LIPITOR) 40 MG tablet, TAKE 1 TABLET BY MOUTH EVERY DAY, Disp: 90 tablet, Rfl: 0 .  bisoprolol-hydrochlorothiazide (ZIAC) 10-6.25 MG tablet, TAKE 1 TABLET BY MOUTH EVERY DAY IN THE MORNING, Disp: 90 tablet, Rfl: 0 .  blood glucose meter kit and supplies KIT, Dispense based on patient and insurance preference. Use twice daily times daily as directed. (FOR ICD-10  E11.65), Disp: 1 each, Rfl: 0 .  Choline Fenofibrate (FENOFIBRIC ACID) 135 MG CPDR, TAKE 1 CAPSULE BY MOUTH EVERY DAY IN THE EVENING, Disp: 90 capsule, Rfl: 1 .  Dulaglutide (TRULICITY) 1.5 KW/4.0XB SOPN, Inject 1.5 mg into the skin once a week., Disp: 4 pen, Rfl: 2 .  glipiZIDE (GLUCOTROL XL) 10 MG 24 hr tablet, TAKE 1 TABLET BY MOUTH EVERY DAY, Disp: 90 tablet, Rfl: 0 .  glucose blood test strip, Dispense based on patient and insurance preference.  Use twice daily as directed. (FOR ICD-10 E11.65), Disp: 100 each, Rfl: 11 .  JANUVIA 100 MG tablet, TAKE 1 TABLET BY MOUTH ONCE DAILY. PATIENT NEEDS APPOINTMENT FOR REFILLS, Disp: 90 tablet, Rfl: 0 .  metFORMIN (GLUCOPHAGE-XR) 500 MG 24 hr tablet, TAKE 4 TABLETS WITH BREAKFAST, Disp: 360 tablet, Rfl: 0 .  montelukast (SINGULAIR) 10 MG tablet, Take 1 tablet (10 mg total) by mouth at bedtime., Disp: 30 tablet, Rfl: 3 .  TRUEPLUS LANCETS 33G MISC, 1 each by Does not apply route 2 (two) times daily., Disp: 100 each, Rfl: 11  Allergies  Allergen Reactions  . Niaspan [Niacin] Rash   Social History   Socioeconomic History  . Marital status: Married  Spouse name: Not on file  . Number of children: Not on file  . Years of education: Not on file  . Highest education level: Not on file  Occupational History  . Not on file  Social Needs  . Financial resource strain:  Not on file  . Food insecurity:    Worry: Not on file    Inability: Not on file  . Transportation needs:    Medical: Not on file    Non-medical: Not on file  Tobacco Use  . Smoking status: Former Smoker    Packs/day: 3.00    Years: 6.00    Pack years: 18.00    Types: Cigarettes    Last attempt to quit: 02/05/1999    Years since quitting: 18.5  . Smokeless tobacco: Never Used  Substance and Sexual Activity  . Alcohol use: No    Alcohol/week: 0.0 oz  . Drug use: No  . Sexual activity: Not on file  Lifestyle  . Physical activity:    Days per week: Not on file    Minutes per session: Not on file  . Stress: Not on file  Relationships  . Social connections:    Talks on phone: Not on file    Gets together: Not on file    Attends religious service: Not on file    Active member of club or organization: Not on file    Attends meetings of clubs or organizations: Not on file    Relationship status: Not on file  . Intimate partner violence:    Fear of current or ex partner: Not on file    Emotionally abused: Not on file    Physically abused: Not on file    Forced sexual activity: Not on file  Other Topics Concern  . Not on file  Social History Narrative  . Not on file     Review of Systems  All other systems reviewed and are negative.       Objective:   Physical Exam  Constitutional: He appears well-developed and well-nourished.  HENT:  Head: Normocephalic and atraumatic.  Neck: Trachea normal and normal range of motion. Neck supple. No neck rigidity. No thyroid mass and no thyromegaly present.    Cardiovascular: Normal rate, regular rhythm and normal heart sounds.  Pulmonary/Chest: Effort normal and breath sounds normal. No respiratory distress. He has no wheezes. He has no rales. He exhibits no tenderness.  Abdominal: Soft. Bowel sounds are normal.  Musculoskeletal: He exhibits no edema.  Neurological: He is alert.  Vitals reviewed.         Assessment & Plan:   Diabetes mellitus type 2, uncontrolled, with complications ([place])  Essential hypertension, benign  Dyslipidemia  Lymphadenopathy of head and neck - Plan: CBC with Differential/Platelet, Sedimentation rate  Uncontrolled type 2 diabetes mellitus without complication, without long-term current use of insulin ([place]) - Plan: blood glucose meter kit and supplies KIT  Type II diabetes mellitus, uncontrolled ([place]) - Plan: glucose blood test strip  I believe that we are palpating is benign lymph node in the anterior cervical chain.  However I am unable to assuage the patient's fears today.  He would feel better if we obtained an ultrasound just for peace of mind.  I cautioned the patient not to lose sight of the real danger for him which continues to be his dyslipidemia, uncontrolled diabetes, etc. I feel these put him at high risk for mortality and morbidity than the nodule that he is palpating in his neck.  However I will schedule an ultrasound to try to provide him additional piece of mine.  I have asked him to record his fasting blood sugars and 2-hour postprandial sugars and return them to me so that I can review them to ensure that the Trulicity is effective controlling his blood sugar.  I will also check a CBC and a sedimentation rate

## 2017-09-01 ENCOUNTER — Encounter (INDEPENDENT_AMBULATORY_CARE_PROVIDER_SITE_OTHER): Payer: Self-pay

## 2017-09-05 ENCOUNTER — Ambulatory Visit (HOSPITAL_COMMUNITY)
Admission: RE | Admit: 2017-09-05 | Discharge: 2017-09-05 | Disposition: A | Discharge status: Home / Self Care | Payer: BC Managed Care – PPO | Source: Ambulatory Visit | Attending: Family Medicine | Admitting: Family Medicine

## 2017-09-05 ENCOUNTER — Encounter (INDEPENDENT_AMBULATORY_CARE_PROVIDER_SITE_OTHER): Payer: Self-pay

## 2017-09-05 DIAGNOSIS — R591 Generalized enlarged lymph nodes: Secondary | ICD-10-CM | POA: Diagnosis not present

## 2017-09-05 LAB — HM DIABETES EYE EXAM

## 2017-09-12 ENCOUNTER — Encounter: Payer: Self-pay | Admitting: *Deleted

## 2017-09-12 ENCOUNTER — Encounter: Payer: Self-pay | Admitting: Family Medicine

## 2017-09-12 DIAGNOSIS — E133299 Other specified diabetes mellitus with mild nonproliferative diabetic retinopathy without macular edema, unspecified eye: Secondary | ICD-10-CM | POA: Insufficient documentation

## 2017-09-27 ENCOUNTER — Other Ambulatory Visit: Payer: Self-pay | Admitting: Family Medicine

## 2017-10-01 ENCOUNTER — Ambulatory Visit (INDEPENDENT_AMBULATORY_CARE_PROVIDER_SITE_OTHER): Payer: Worker's Compensation | Admitting: Physician Assistant

## 2017-10-01 ENCOUNTER — Encounter: Payer: Self-pay | Admitting: Physician Assistant

## 2017-10-01 VITALS — BP 160/98 | HR 90 | Temp 98.4°F | Resp 18 | Ht 74.0 in | Wt 258.0 lb

## 2017-10-01 DIAGNOSIS — S51851A Open bite of right forearm, initial encounter: Secondary | ICD-10-CM | POA: Diagnosis not present

## 2017-10-01 DIAGNOSIS — W503XXA Accidental bite by another person, initial encounter: Secondary | ICD-10-CM
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
## 2017-10-02 LAB — HEPATITIS PANEL, ACUTE
Hep A IgM: NONREACTIVE
Hep B C IgM: NONREACTIVE
Hepatitis B Surface Ag: NONREACTIVE
Hepatitis C Ab: NONREACTIVE
SIGNAL TO CUT-OFF: 0.06 (ref <1.00)

## 2017-10-02 LAB — HIV ANTIBODY (ROUTINE TESTING W REFLEX): HIV 1&2 Ab, 4th Generation: NONREACTIVE

## 2017-10-14 ENCOUNTER — Other Ambulatory Visit: Payer: Self-pay | Admitting: Family Medicine

## 2017-10-20 ENCOUNTER — Ambulatory Visit: Payer: Self-pay | Admitting: Family Medicine

## 2017-10-20 ENCOUNTER — Encounter: Payer: Self-pay | Admitting: Physician Assistant

## 2017-10-24 ENCOUNTER — Encounter: Payer: Self-pay | Admitting: Physician Assistant

## 2017-10-30 ENCOUNTER — Encounter: Payer: Self-pay | Admitting: Physician Assistant

## 2017-10-31 ENCOUNTER — Encounter: Payer: Self-pay | Admitting: Family Medicine

## 2017-10-31 ENCOUNTER — Ambulatory Visit (INDEPENDENT_AMBULATORY_CARE_PROVIDER_SITE_OTHER): Payer: BC Managed Care – PPO | Admitting: Family Medicine

## 2017-10-31 VITALS — BP 118/84 | HR 82 | Temp 98.1°F | Resp 16 | Ht 73.5 in | Wt 252.0 lb

## 2017-10-31 DIAGNOSIS — E785 Hyperlipidemia, unspecified: Secondary | ICD-10-CM

## 2017-10-31 DIAGNOSIS — E118 Type 2 diabetes mellitus with unspecified complications: Secondary | ICD-10-CM

## 2017-10-31 DIAGNOSIS — R591 Generalized enlarged lymph nodes: Secondary | ICD-10-CM | POA: Diagnosis not present

## 2017-10-31 DIAGNOSIS — E1165 Type 2 diabetes mellitus with hyperglycemia: Secondary | ICD-10-CM

## 2017-10-31 DIAGNOSIS — I10 Essential (primary) hypertension: Secondary | ICD-10-CM | POA: Diagnosis not present

## 2017-10-31 DIAGNOSIS — IMO0002 Reserved for concepts with insufficient information to code with codable children: Secondary | ICD-10-CM
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
## 2017-11-01 LAB — HEMOGLOBIN A1C
HEMOGLOBIN A1C: 8 %{Hb} — AB (ref <5.7)
MEAN PLASMA GLUCOSE: 183 (calc)
eAG (mmol/L): 10.1 (calc)

## 2017-11-01 LAB — CBC WITH DIFFERENTIAL/PLATELET
Basophils Absolute: 81 cells/uL (ref 0–200)
Basophils Relative: 1.3 %
EOS PCT: 3.4 %
Eosinophils Absolute: 211 cells/uL (ref 15–500)
HCT: 48.3 % (ref 38.5–50.0)
Hemoglobin: 16.4 g/dL (ref 13.2–17.1)
Lymphs Abs: 1717 cells/uL (ref 850–3900)
MCH: 30.8 pg (ref 27.0–33.0)
MCHC: 34 g/dL (ref 32.0–36.0)
MCV: 90.6 fL (ref 80.0–100.0)
MPV: 9.5 fL (ref 7.5–12.5)
Monocytes Relative: 9.1 %
NEUTROS PCT: 58.5 %
Neutro Abs: 3627 cells/uL (ref 1500–7800)
PLATELETS: 301 10*3/uL (ref 140–400)
RBC: 5.33 10*6/uL (ref 4.20–5.80)
RDW: 12.7 % (ref 11.0–15.0)
TOTAL LYMPHOCYTE: 27.7 %
WBC mixed population: 564 cells/uL (ref 200–950)
WBC: 6.2 10*3/uL (ref 3.8–10.8)

## 2017-11-01 LAB — COMPLETE METABOLIC PANEL WITH GFR
AG RATIO: 1.7 (calc) (ref 1.0–2.5)
ALBUMIN MSPROF: 4.5 g/dL (ref 3.6–5.1)
ALT: 45 U/L (ref 9–46)
AST: 31 U/L (ref 10–40)
Alkaline phosphatase (APISO): 66 U/L (ref 40–115)
BILIRUBIN TOTAL: 0.6 mg/dL (ref 0.2–1.2)
BUN: 9 mg/dL (ref 7–25)
CHLORIDE: 103 mmol/L (ref 98–110)
CO2: 28 mmol/L (ref 20–32)
Calcium: 9.2 mg/dL (ref 8.6–10.3)
Creat: 0.98 mg/dL (ref 0.60–1.35)
GFR, EST AFRICAN AMERICAN: 110 mL/min/{1.73_m2} (ref >=60)
GFR, Est Non African American: 95 mL/min/{1.73_m2} (ref >=60)
Globulin: 2.6 g/dL (calc) (ref 1.9–3.7)
Glucose, Bld: 164 mg/dL — ABNORMAL HIGH (ref 65–99)
POTASSIUM: 4.8 mmol/L (ref 3.5–5.3)
SODIUM: 139 mmol/L (ref 135–146)
Total Protein: 7.1 g/dL (ref 6.1–8.1)

## 2017-11-01 LAB — LIPID PANEL
Cholesterol: 124 mg/dL (ref <200)
HDL: 26 mg/dL — ABNORMAL LOW (ref >40)
LDL CHOLESTEROL (CALC): 78 mg/dL
Non-HDL Cholesterol (Calc): 98 mg/dL (calc) (ref <130)
Total CHOL/HDL Ratio: 4.8 (calc) (ref <5.0)
Triglycerides: 118 mg/dL (ref <150)

## 2017-11-01 LAB — MICROALBUMIN, URINE: Microalb, Ur: 0.5 mg/dL

## 2017-11-03 ENCOUNTER — Telehealth: Payer: Self-pay
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
## 2017-11-12 ENCOUNTER — Encounter: Payer: Self-pay | Admitting: Family Medicine

## 2017-11-12 ENCOUNTER — Ambulatory Visit (INDEPENDENT_AMBULATORY_CARE_PROVIDER_SITE_OTHER): Payer: BC Managed Care – PPO | Admitting: Family Medicine

## 2017-11-12 ENCOUNTER — Other Ambulatory Visit: Payer: Self-pay

## 2017-11-12 VITALS — BP 116/76 | HR 82 | Temp 98.9°F | Resp 16 | Ht 73.5 in | Wt 256.0 lb

## 2017-11-12 DIAGNOSIS — R208 Other disturbances of skin sensation: Secondary | ICD-10-CM | POA: Diagnosis not present

## 2017-11-12 DIAGNOSIS — J45909 Unspecified asthma, uncomplicated: Secondary | ICD-10-CM

## 2017-11-12 DIAGNOSIS — M545 Low back pain, unspecified: Secondary | ICD-10-CM

## 2017-11-12 DIAGNOSIS — R200 Anesthesia of skin: Secondary | ICD-10-CM

## 2017-11-12 DIAGNOSIS — M79672 Pain in left foot: Secondary | ICD-10-CM | POA: Diagnosis not present

## 2017-11-12 DIAGNOSIS — J069 Acute upper respiratory infection, unspecified: Secondary | ICD-10-CM | POA: Diagnosis not present

## 2017-11-12 MED ORDER — ALBUTEROL SULFATE HFA 108 (90 BASE) MCG/ACT IN AERS: 2.0000 | INHALATION_SPRAY | RESPIRATORY_TRACT | 0 refills | Status: DC | PRN

## 2017-11-12 MED ORDER — PREDNISONE 20 MG PO TABS: ORAL_TABLET | ORAL | 0 refills | Status: DC

## 2017-11-12 MED ORDER — BENZONATATE 100 MG PO CAPS: 100.0000 mg | ORAL_CAPSULE | Freq: Two times a day (BID) | ORAL | 0 refills | Status: DC | PRN

## 2017-11-12 MED ORDER — CYCLOBENZAPRINE HCL 10 MG PO TABS: 10.0000 mg | ORAL_TABLET | Freq: Three times a day (TID) | ORAL | 0 refills | Status: DC | PRN

## 2017-11-12 MED ORDER — AZITHROMYCIN 250 MG PO TABS: ORAL_TABLET | ORAL | 0 refills | Status: DC

## 2017-11-12 MED FILL — BENZONATATE 100 MG CAP: 100 | 10 days supply | Qty: 20 | Fill #0

## 2017-11-12 MED FILL — AZITHROMYCIN 250 MG TABLET: 250 | 5 days supply | Qty: 6 | Fill #0

## 2017-11-12 MED FILL — CYCLOBENZAPRINE HCL 10 MG T: 10 | 10 days supply | Qty: 30 | Fill #0

## 2017-11-12 MED FILL — ALBUTEROL SULFATE HFA 108 (: 108 (90 BAS | 16 days supply | Qty: 9 | Fill #0

## 2017-11-12 MED FILL — predniSONE 20 MG TABS: 20 | 6 days supply | Qty: 12 | Fill #0

## 2017-11-12 NOTE — Progress Notes (Signed)
Patient ID: Matthew Osborne, male    DOB: 1975/07/27, 42 y.o.   MRN: [id number]  PCP: Susy Frizzle, MD  Chief Complaint  Patient presents with  . Lower Back Pain    x weeks- sciatic nerve pain in L side  . Cough    x1 day- woke up wiht cough and sore throat    Subjective:   Matthew Osborne is a 42 y.o. male, presents to clinic with CC of intermittent left low back pain for several weeks, intermittent left foot pain and numbness, and new cough and sore throat with vague URI symptoms that began yesterday, starting to cause coughing fits and spasms, he is concerned with his history of severe bronchitis and bronchospasms, currently no fever, sweats, fatigue, hot cold chills, chest pain or shortness of breath. Back Pain  This is a recurrent problem. The current episode started 1 to 4 weeks ago. The problem occurs intermittently. The problem is unchanged. The pain is present in the gluteal and lumbar spine. The quality of the pain is described as shooting and stabbing. The pain radiates to the left thigh. The pain is mild. Associated symptoms include leg pain, numbness, paresthesias and tingling. Pertinent negatives include no abdominal pain, bladder incontinence, bowel incontinence, chest pain, dysuria, fever, headaches, paresis, pelvic pain, perianal numbness, weakness or weight loss. Risk factors include obesity and poor posture. Treatments tried: stretches. The treatment provided mild relief.  Cough  This is a new problem. The current episode started yesterday. The problem has been rapidly worsening. The problem occurs every few hours. The cough is non-productive. Associated symptoms include a sore throat. Pertinent negatives include no chest pain, chills, ear congestion, ear pain, fever, headaches, hemoptysis, myalgias, nasal congestion, postnasal drip, rash, rhinorrhea, shortness of breath, sweats, weight loss or wheezing. Nothing aggravates the symptoms. He has tried nothing for the symptoms.  His past medical history is significant for bronchitis.  Complains of left foot numbness, tingling and burning sensation that is migratory, comes and goes in different locations.  He states sometimes it underneath his foot and his arch, feels like there is a ball there that he stepping on or feels like someone is pushing on it.  Yesterday he felt that it was on the outside of his foot from his 3rd-5th toes.  It seems to come and go related to different positions he is in or with walking or use of his leg.  He does describe some burning, pins and needle sensation, also numbness like it is dead or sensation that is bigger than it actually is.  This is been coming and going for the past 3 weeks.  In the exam room seated on the table with his legs dangling and bent at the knees this elicits numbness and tingling in his left foot.  If he stands on his right leg and stretches out his left leg propped up on the exam table this improves it.  Simply standing or walking sometimes brings the pain and numbness on.  He does not have any similar symptoms in the right foot or leg.  None of the pain or numbness extends into his calf when he is walking.  He denies any associated swelling of his left leg when compared to his right, he denies any injury, redness, rash, pallor.  He does have type 2 diabetes that has been improving over the past 4 months.  May of this year his A1c was 10.1, about 2 weeks ago his A1c  improved to 8.0.  Per his chart he does have a history of retinopathy secondary to diabetes but he is never had any peripheral neuropathy.  He is been compliant with Trulicity but is not as compliant with pills which on his chart is glipizide, Januvia and metformin.    He does have this intermittent low back pain which he points to his left low lumbar area and left buttocks but he cannot reproduce it currently with any movement or palpation he states it feels deeper, comes and goes.  He sometimes has it shoot to his left  buttocks and leg but it has never radiated completely down to his foot he is unsure if his new foot symptoms are related to his low back pain   Patient Active Problem List   Diagnosis Date Noted  . Nonproliferative retinopathy due to secondary diabetes ([place])   . Allergic rhinitis 06/17/2014  . Sinus congestion 05/27/2014  . Chronic cough 02/14/2014  . Essential hypertension, benign 06/30/2013  . Hyperlipidemia 06/30/2013  . Diabetes ([place]) 06/30/2013     Prior to Admission medications   Medication Sig Start Date End Date Taking? Authorizing Provider  aspirin 81 MG tablet Take 81 mg by mouth daily.   Yes [provider]  atorvastatin (LIPITOR) 40 MG tablet TAKE 1 TABLET BY MOUTH EVERY DAY 09/29/17  Yes Susy Frizzle, MD  bisoprolol-hydrochlorothiazide [facility]) 10-6.25 MG tablet TAKE 1 TABLET BY MOUTH EVERY DAY IN THE MORNING 09/29/17  Yes Susy Frizzle, MD  blood glucose meter kit and supplies KIT Dispense based on patient and insurance preference. Use twice daily times daily as directed. (FOR ICD-10  E11.65) 08/29/17  Yes Susy Frizzle, MD  Choline Fenofibrate (FENOFIBRIC ACID) 135 MG CPDR TAKE 1 CAPSULE BY MOUTH EVERY DAY IN THE EVENING 07/28/17  Yes Susy Frizzle, MD  glipiZIDE (GLUCOTROL XL) 10 MG 24 hr tablet TAKE 1 TABLET BY MOUTH EVERY DAY 09/29/17  Yes Susy Frizzle, MD  glucose blood test strip Dispense based on patient and insurance preference.  Use twice daily as directed. (FOR ICD-10 E11.65) 08/29/17  Yes Susy Frizzle, MD  JANUVIA 100 MG tablet TAKE 1 TABLET BY MOUTH ONCE DAILY 09/29/17  Yes Susy Frizzle, MD  Lancets Thin MISC Check BS BID 08/29/17  Yes Susy Frizzle, MD  metFORMIN (GLUCOPHAGE-XR) 500 MG 24 hr tablet TAKE 4 TABLETS BY MOUTH ONCE DAILY WITH BREAKFAST 09/29/17  Yes Susy Frizzle, MD  TRUEPLUS LANCETS 33G MISC 1 each by Does not apply route 2 (two) times daily. 07/06/13  Yes Susy Frizzle, MD  TRULICITY 1.5 YJ/8.5UD SOPN INJECT  1.5 MG SUBCUTANEOUSLY INTO THE SKIN ONCE A WEEK. 10/14/17  Yes Susy Frizzle, MD     Allergies  Allergen Reactions  . Niaspan [Niacin] Rash     Family History  Problem Relation Age of Onset  . Cancer Other   . Hyperlipidemia Other   . Hypertension Brother   . Asthma Son   . Lung cancer Maternal Grandfather   . Rheum arthritis Maternal Grandmother      Social History   Socioeconomic History  . Marital status: Married    Spouse name: Not on file  . Number of children: Not on file  . Years of education: Not on file  . Highest education level: Not on file  Occupational History  . Not on file  Social Needs  . Financial resource strain: Not on file  . Food insecurity:  Worry: Not on file    Inability: Not on file  . Transportation needs:    Medical: Not on file    Non-medical: Not on file  Tobacco Use  . Smoking status: Former Smoker    Packs/day: 3.00    Years: 6.00    Pack years: 18.00    Types: Cigarettes    Last attempt to quit: 02/05/1999    Years since quitting: 18.7  . Smokeless tobacco: Never Used  Substance and Sexual Activity  . Alcohol use: No    Alcohol/week: 0.0 standard drinks  . Drug use: No  . Sexual activity: Not on file  Lifestyle  . Physical activity:    Days per week: Not on file    Minutes per session: Not on file  . Stress: Not on file  Relationships  . Social connections:    Talks on phone: Not on file    Gets together: Not on file    Attends religious service: Not on file    Active member of club or organization: Not on file    Attends meetings of clubs or organizations: Not on file    Relationship status: Not on file  . Intimate partner violence:    Fear of current or ex partner: Not on file    Emotionally abused: Not on file    Physically abused: Not on file    Forced sexual activity: Not on file  Other Topics Concern  . Not on file  Social History Narrative  . Not on file     Review of Systems  Constitutional:  Negative for activity change, appetite change, chills, diaphoresis, fatigue, fever and weight loss.  HENT: Positive for congestion, sore throat and voice change (scratchy). Negative for dental problem, drooling, ear discharge, ear pain, facial swelling, hearing loss, mouth sores, nosebleeds, postnasal drip, rhinorrhea, sinus pressure, sinus pain, sneezing, tinnitus and trouble swallowing.   Eyes: Negative.   Respiratory: Positive for cough. Negative for apnea, hemoptysis, choking, chest tightness, shortness of breath, wheezing and stridor.   Cardiovascular: Negative.  Negative for chest pain, palpitations and leg swelling.  Gastrointestinal: Negative.  Negative for abdominal distention, abdominal pain, anal bleeding, blood in stool, bowel incontinence, diarrhea, nausea and vomiting.  Endocrine: Negative.   Genitourinary: Negative for bladder incontinence, dysuria and pelvic pain.  Musculoskeletal: Positive for back pain. Negative for arthralgias and myalgias.  Skin: Negative for color change, pallor, rash and wound.  Allergic/Immunologic: Negative.   Neurological: Positive for tingling, numbness and paresthesias. Negative for dizziness, weakness and headaches.  Hematological: Negative.  Negative for adenopathy. Does not bruise/bleed easily.  Psychiatric/Behavioral: Negative.   All other systems reviewed and are negative.      Objective:    Vitals:   11/12/17 1556  BP: 116/76  Pulse: 82  Resp: 16  Temp: 98.9 F (37.2 C)  TempSrc: Oral  SpO2: 97%  Weight: 256 lb (116.1 kg)  Height: 6' 1.5" (1.867 m)      Physical Exam  Constitutional: Vital signs are normal. He appears well-developed and well-nourished.  Non-toxic appearance. He does not have a sickly appearance. No distress.  HENT:  Head: Normocephalic and atraumatic.  Right Ear: External ear normal.  Left Ear: External ear normal.  Nose: Nose normal.  Mouth/Throat: Uvula is midline and mucous membranes are normal. Mucous  membranes are not pale, not dry and not cyanotic. No trismus in the jaw. Posterior oropharyngeal erythema (mild) present. No oropharyngeal exudate, posterior oropharyngeal edema or tonsillar abscesses.  Cardiovascular:  Normal rate, regular rhythm and normal heart sounds. Exam reveals no gallop and no friction rub.  No murmur heard. Pulses:      Radial pulses are 2+ on the right side, and 2+ on the left side.       Dorsalis pedis pulses are 1+ on the right side, and 1+ on the left side.       Posterior tibial pulses are 1+ on the right side, and 1+ on the left side.  Pulmonary/Chest: No accessory muscle usage or stridor. No tachypnea. No respiratory distress. He has no decreased breath sounds. He has no wheezes. He has no rhonchi. He has no rales.  Intermittent coughing fits, splinted respirations  Musculoskeletal:       Cervical back: Normal.       Thoracic back: Normal.       Lumbar back: He exhibits normal range of motion, no tenderness, no bony tenderness, no swelling, no edema, no deformity, no laceration, no pain, no spasm and normal pulse.       Left foot: There is normal range of motion and no deformity.  Negative SLR bilaterally  Feet:  Left Foot:  Protective Sensation: 10 sites tested. 9 sites sensed.  Skin Integrity: Negative for ulcer, blister, skin breakdown, erythema, warmth, callus or dry skin.  Neurological: He is alert.  Grossly normal sensation to light touch in all extremities  Skin: Skin is warm and intact. Capillary refill takes less than 2 seconds. No rash noted. He is not diaphoretic. No cyanosis. No pallor. Nails show no clubbing.  No pallor, rubor, cyanosis, mottling No edema to bilateral lower extremities, no hyperpigmentation  Nursing note and vitals reviewed.         Assessment & Plan:      ICD-10-CM   1. Left foot pain M79.672 VAS [place] ABI WITH/WO TBI  2. Numbness of left foot R20.8 VAS [place] ABI WITH/WO TBI  3. Upper respiratory tract infection, unspecified  type J06.9 predniSONE (DELTASONE) 20 MG tablet    benzonatate (TESSALON) 100 MG capsule    azithromycin (ZITHROMAX) 250 MG tablet    albuterol (PROVENTIL HFA;VENTOLIN HFA) 108 (90 Base) MCG/ACT inhaler  4. Reactive airway disease without complication, unspecified asthma severity, unspecified whether persistent J45.909 predniSONE (DELTASONE) 20 MG tablet    albuterol (PROVENTIL HFA;VENTOLIN HFA) 108 (90 Base) MCG/ACT inhaler  5. Left low back pain, unspecified chronicity, unspecified whether sciatica present M54.5 cyclobenzaprine (FLEXERIL) 10 MG tablet    predniSONE (DELTASONE) 20 MG tablet    Patient with multiple complaints, for the past several weeks he does complain of intermittent left low back and low buttock pain with occasional radiation to his left leg also has several weeks of intermittent and migratory left foot numbness tingling and paresthesias which do not come at the same time as his low back pain.  Patient may have some degenerative disc disease or lumbar strain however is not reproducible on exam with palpation he does have normal range of motion and negative straight leg raise  Interesting presentation with his foot numbness that is in different areas and is coming and going.  Tested with monofilament and he does have good sensation throughout the foot only missed site 8.  Foot is normal-appearing without any signs of callus, infection.  He did have difficult to palpate pulses to bilateral lower extremities with dorsal pedis and posterior tibialis pulses but he has no pallor and his feet and toes are warm to the touch with normal capillary refill.  Is unclear if his foot numbness tingling is related to his low back some kind of atypical sciatica, if symptoms in his foot are secondary to diabetes or if this may be some peripheral arterial disease with the diminished pulses and positional nature of his foot numbness.  Plan to get ABIs, and if negative possibly gets some nerve testing  done  Plan to treat his back pain with Flexeril, gentle stretching and heat advised.  Also concerned with URI symptoms, scratchy throat that is sore, cough that is easily triggered with a recent history of severe bronchitis that required multiple visits, inhalers and steroids to recover from.  He feels that he is already having bronchospasms and coughing fits when taking deep breaths.  Will treat more aggressively with steroids, refill of his albuterol inhaler, cough medicines, Z-Pak given his poorly controlled diabetes.  Steroids may help some of his back pain or foot paresthesias however I do not feel like we need to try gabapentin or anything at this time until we have a clear etiology of his foot pain and tingling.    He has no red flags concerning for cauda equina to be related to back pain and foot numbness no saddle anesthesias, incontinence of stool or urine.    Delsa Grana, PA-C 11/12/17 4:12 PM

## 2017-11-13 ENCOUNTER — Telehealth: Payer: Self-pay | Admitting: Family Medicine

## 2017-11-13 NOTE — Telephone Encounter (Signed)
Pt was in to see Leisa yesterday and informed of labs and states that he will not take Actos d/t it causing cancer and refuses to use insulin. He will continue to work on his diet and exercise for now as his A1C has dropped significantly over the past few months (per pt)

## 2017-11-20 ENCOUNTER — Ambulatory Visit: Payer: Self-pay | Admitting: Family Medicine

## 2017-11-21 ENCOUNTER — Ambulatory Visit (INDEPENDENT_AMBULATORY_CARE_PROVIDER_SITE_OTHER): Payer: BC Managed Care – PPO | Admitting: Family Medicine

## 2017-11-21 ENCOUNTER — Encounter (HOSPITAL_COMMUNITY): Payer: BC Managed Care – PPO

## 2017-11-21 ENCOUNTER — Encounter: Payer: Self-pay | Admitting: Family Medicine

## 2017-11-21 VITALS — BP 130/82 | HR 96 | Temp 98.4°F | Wt 257.0 lb

## 2017-11-21 DIAGNOSIS — M79672 Pain in left foot: Secondary | ICD-10-CM

## 2017-11-21 DIAGNOSIS — W503XXD Accidental bite by another person, subsequent encounter: Secondary | ICD-10-CM

## 2017-11-21 MED ORDER — GABAPENTIN 300 MG PO CAPS: 300.0000 mg | ORAL_CAPSULE | Freq: Three times a day (TID) | ORAL | 3 refills | Status: DC

## 2017-11-21 MED FILL — GABAPENTIN 300 MG CAPSULE: 300 | 30 days supply | Qty: 90 | Fill #0
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
## 2017-11-22 LAB — HEPATITIS PANEL, ACUTE
HEP B S AG: NONREACTIVE
HEP C AB: NONREACTIVE
Hep A IgM: NONREACTIVE
Hep B C IgM: NONREACTIVE
SIGNAL TO CUT-OFF: 0.06 (ref <1.00)

## 2017-11-22 LAB — HIV ANTIBODY (ROUTINE TESTING W REFLEX): HIV: NONREACTIVE

## 2017-11-24 ENCOUNTER — Encounter: Payer: Self-pay | Admitting: *Deleted

## 2017-11-25 ENCOUNTER — Ambulatory Visit (HOSPITAL_COMMUNITY)
Admission: RE | Admit: 2017-11-25 | Discharge: 2017-11-25 | Disposition: A | Discharge status: Home / Self Care | Payer: BC Managed Care – PPO | Source: Ambulatory Visit | Attending: Family Medicine | Admitting: Family Medicine

## 2017-11-25 DIAGNOSIS — E119 Type 2 diabetes mellitus without complications: Secondary | ICD-10-CM | POA: Diagnosis not present

## 2017-11-25 DIAGNOSIS — I10 Essential (primary) hypertension: Secondary | ICD-10-CM | POA: Insufficient documentation

## 2017-11-25 DIAGNOSIS — R208 Other disturbances of skin sensation: Secondary | ICD-10-CM | POA: Diagnosis not present

## 2017-11-25 DIAGNOSIS — M79672 Pain in left foot: Secondary | ICD-10-CM | POA: Insufficient documentation

## 2017-11-25 NOTE — Progress Notes (Signed)
VASCULAR LAB PRELIMINARY  ARTERIAL  ABI completed: Resting bilateral ankle-brachial indecis are within normal range. No evidence of significant bilateral lower extremities arterial disease. Bilateral toe-brachial indecis are normal.    RIGHT    LEFT    PRESSURE WAVEFORM  PRESSURE WAVEFORM  BRACHIAL 154 Triphasic BRACHIAL 161 Triphasic  DP 197 Triphasic DP 203 Triphasic  PT 129 Triphasic PT 191 Triphasic  GREAT TOE 180  GREAT TOE 142     RIGHT LEFT  ABI/TBI 1.22/1.12 1.26/0.88     HONGYING  Ethlyn Alto, RVT 11/25/2017, 4:46 PM

## 2018-01-05 ENCOUNTER — Other Ambulatory Visit: Payer: Self-pay | Admitting: Family Medicine

## 2018-01-06 ENCOUNTER — Ambulatory Visit (INDEPENDENT_AMBULATORY_CARE_PROVIDER_SITE_OTHER): Payer: BC Managed Care – PPO | Admitting: Family Medicine

## 2018-01-06 ENCOUNTER — Other Ambulatory Visit: Payer: Self-pay

## 2018-01-06 VITALS — BP 130/68 | HR 82 | Temp 98.4°F | Resp 16 | Ht 73.5 in | Wt 244.0 lb

## 2018-01-06 DIAGNOSIS — R197 Diarrhea, unspecified: Secondary | ICD-10-CM | POA: Diagnosis not present

## 2018-01-06 DIAGNOSIS — E1165 Type 2 diabetes mellitus with hyperglycemia: Secondary | ICD-10-CM | POA: Diagnosis not present

## 2018-01-06 LAB — CBC WITH DIFFERENTIAL/PLATELET
Basophils Absolute: 70 cells/uL (ref 0–200)
Basophils Relative: 0.8 %
EOS PCT: 2.4 %
Eosinophils Absolute: 209 cells/uL (ref 15–500)
HCT: 46.6 % (ref 38.5–50.0)
Hemoglobin: 16.3 g/dL (ref 13.2–17.1)
Lymphs Abs: 2088 cells/uL (ref 850–3900)
MCH: 30.8 pg (ref 27.0–33.0)
MCHC: 35 g/dL (ref 32.0–36.0)
MCV: 87.9 fL (ref 80.0–100.0)
MONOS PCT: 10.2 %
MPV: 10.2 fL (ref 7.5–12.5)
NEUTROS PCT: 62.6 %
Neutro Abs: 5446 cells/uL (ref 1500–7800)
PLATELETS: 283 10*3/uL (ref 140–400)
RBC: 5.3 10*6/uL (ref 4.20–5.80)
RDW: 11.7 % (ref 11.0–15.0)
TOTAL LYMPHOCYTE: 24 %
WBC mixed population: 887 cells/uL (ref 200–950)
WBC: 8.7 10*3/uL (ref 3.8–10.8)

## 2018-01-06 LAB — COMPREHENSIVE METABOLIC PANEL
AG Ratio: 1.6 (calc) (ref 1.0–2.5)
ALKALINE PHOSPHATASE (APISO): 81 U/L (ref 40–115)
ALT: 23 U/L (ref 9–46)
AST: 18 U/L (ref 10–40)
Albumin: 4.3 g/dL (ref 3.6–5.1)
BUN: 12 mg/dL (ref 7–25)
CO2: 29 mmol/L (ref 20–32)
CREATININE: 0.87 mg/dL (ref 0.60–1.35)
Calcium: 9.5 mg/dL (ref 8.6–10.3)
Chloride: 100 mmol/L (ref 98–110)
GLUCOSE: 130 mg/dL — AB (ref 65–99)
Globulin: 2.7 g/dL (calc) (ref 1.9–3.7)
Potassium: 4 mmol/L (ref 3.5–5.3)
Sodium: 138 mmol/L (ref 135–146)
TOTAL PROTEIN: 7 g/dL (ref 6.1–8.1)
Total Bilirubin: 0.5 mg/dL (ref 0.2–1.2)

## 2018-01-06 LAB — HEMOGLOBIN A1C, FINGERSTICK: HEMOGLOBIN A1C, FINGERSTICK: 9 %{Hb} — AB (ref <6.0)

## 2018-01-06 LAB — GLUCOSE 16585: Glucose: 133 mg/dL — ABNORMAL HIGH (ref 65–99)

## 2018-01-06 NOTE — Progress Notes (Signed)
Subjective:    Patient ID: Matthew Osborne, male    DOB: [birth year], 41 y.o.   MRN: [id number]  Patient presents for F/U FSBS (cortisone shot in foot- 2hrs after injection, felt woozy, spaced out- next morning N/V/D- CBG at that time >400- started taking meds as directed (reports that he forgets meds often)- increased loose stools, can't drink enough, weight loss)  Pt here with acute illness. He has Diabetes which has been uncontrolled. He admits to being non compliant with his medications, takes them very randomly. He had a probable mortans neuroman on left foot, was given steroid injection last Tuesday by orthopedics. Wednesday he woke up feeling very fatigued, nauseas, had difficulty concentrating. He had multiple episodes of watery stools, non bloody and 1 episode of emesis. His diarrhea has continued. His appetite ha been decreased. His CBG initially was  400, but has been coming down, since he tried to restart his medications. This AM CBG was 299 , he took 2 metformin and his trulicty, last night took glipizide He did eat yesterday pizza, hamburger, baked chicken, this AM had chicken biscuit  He has lost a lot of weight over past week per report Compared to his last visit 10/18 his weight is down 13lbs  No fever, no sick contacts, no abdominal pain, no URI symptoms  sO FAR diarrhea x 2 episodes today    Review Of Systems:  GEN- + fatigue, fever, weight loss,weakness, recent illness HEENT- denies eye drainage, change in vision, nasal discharge, CVS- denies chest pain, palpitations RESP- denies SOB, cough, wheeze ABD- + N/V, +change in stools, abd pain GU- denies dysuria, hematuria, dribbling, incontinence MSK- denies joint pain, muscle aches, injury Neuro- denies headache, dizziness, syncope, seizure activity       Objective:    BP 130/68   Pulse 82   Temp 98.4 F (36.9 C) (Oral)   Resp 16   Ht 6' 1.5" (1.867 m)   Wt 244 lb (110.7 kg)   SpO2 96%   BMI 31.76 kg/m  GEN- NAD,  alert and oriented x3 HEENT- PERRL, EOMI, non injected sclera, pink conjunctiva, dry MM, oropharynx clear Neck- Supple, no LAD CVS- RRR, no murmur RESP-CTAB ABD-NABS,soft,NT,ND EXT- No edema Pulses- Radial,  2+  A1C 9% CBG 133      Assessment & Plan:      Problem List Items Addressed This Visit      Unprioritized   Diabetes ([place]) - Primary    His symptoms are concerning for possible DKA with the diarrhea the confusion episode of vomiting high sugars.  He is now been more consistent with his medications and his blood sugars are coming down on her own.  His random blood sugar in the office was 133 he is actually taking the metformin and Trulicity this morning.  Also gave him a liter of fluid in which she felt significantly better afterwards.  He has been eating a lot of carbs the past 2 days advised him that this could compete that are not the best for his diabetes.  We discussed the importance of taking his medications on a regular basis.  It is possible that he may not need all the medications that he is taking but we were unsure based on his lab results what he typically takes Since he is tolerating food, and STAT labs were normal  Will have him reduce Metformin to 1000mg  XR once a day Glipizide to 1/2 tablet daily Continue [place] and Trulicity Keep record of the  CBG, f/u in office in 2 weeks  Diarrhea may have been viral or associated with the very high spike in his glucose. Normal CBC, no fever and benign abdominal exam       Relevant Orders   CBC with Differential/Platelet (Completed)   Comprehensive metabolic panel (Completed)   Hemoglobin A1C, fingerstick (Completed)   Glucose, fingerstick (stat)    Other Visit Diagnoses    Diarrhea, unspecified type          Note: This dictation was prepared with Dragon dictation along with smaller phrase technology. Any transcriptional errors that result from this process are unintentional.

## 2018-01-06 NOTE — Patient Instructions (Addendum)
Take your metformin 2 tablets ( 1000mg  ) once a day  Take the [place] Continue the Trulicity Take 1/2 tablet of the Glipizide for now  We will call with lab results F/U pending results

## 2018-01-07 ENCOUNTER — Encounter: Payer: Self-pay | Admitting: Family Medicine

## 2018-01-07 NOTE — Assessment & Plan Note (Signed)
His symptoms are concerning for possible DKA with the diarrhea the confusion episode of vomiting high sugars.  He is now been more consistent with his medications and his blood sugars are coming down on her own.  His random blood sugar in the office was 133 he is actually taking the metformin and Trulicity this morning.  Also gave him a liter of fluid in which she felt significantly better afterwards.  He has been eating a lot of carbs the past 2 days advised him that this could compete that are not the best for his diabetes.  We discussed the importance of taking his medications on a regular basis.  It is possible that he may not need all the medications that he is taking but we were unsure based on his lab results what he typically takes Since he is tolerating food, and STAT labs were normal  Will have him reduce Metformin to 1000mg  XR once a day Glipizide to 1/2 tablet daily Continue januvia and Trulicity Keep record of the CBG, f/u in office in 2 weeks  Diarrhea may have been viral or associated with the very high spike in his glucose. Normal CBC, no fever and benign abdominal exam

## 2018-01-23 ENCOUNTER — Ambulatory Visit: Payer: Self-pay | Admitting: Family Medicine

## 2018-01-29 ENCOUNTER — Encounter: Payer: Self-pay | Admitting: Family Medicine

## 2018-01-29 ENCOUNTER — Other Ambulatory Visit: Payer: Self-pay

## 2018-01-29 ENCOUNTER — Ambulatory Visit: Payer: BC Managed Care – PPO | Admitting: Family Medicine

## 2018-01-29 VITALS — BP 138/74 | HR 82 | Temp 97.9°F | Resp 14 | Ht 73.5 in | Wt 250.0 lb

## 2018-01-29 DIAGNOSIS — E1140 Type 2 diabetes mellitus with diabetic neuropathy, unspecified: Secondary | ICD-10-CM

## 2018-01-29 DIAGNOSIS — E1165 Type 2 diabetes mellitus with hyperglycemia: Secondary | ICD-10-CM | POA: Diagnosis not present

## 2018-01-29 DIAGNOSIS — I10 Essential (primary) hypertension: Secondary | ICD-10-CM

## 2018-01-29 DIAGNOSIS — E7800 Pure hypercholesterolemia, unspecified: Secondary | ICD-10-CM | POA: Diagnosis not present

## 2018-01-29 NOTE — Progress Notes (Signed)
Subjective:    Patient ID: Matthew Osborne, male    DOB: 01-Mar-1975, 42 y.o.   MRN: [id number]  HPI Recently saw my partner while ill, I have copied relevant portions of her note for my reference:  Diabetes ([place]) - Primary     His symptoms are concerning for possible DKA with the diarrhea the confusion episode of vomiting high sugars.  He is now been more consistent with his medications and his blood sugars are coming down on her own.  His random blood sugar in the office was 133 he is actually taking the metformin and Trulicity this morning.  Also gave him a liter of fluid in which she felt significantly better afterwards.  He has been eating a lot of carbs the past 2 days advised him that this could compete that are not the best for his diabetes.  We discussed the importance of taking his medications on a regular basis.  It is possible that he may not need all the medications that he is taking but we were unsure based on his lab results what he typically takes Since he is tolerating food, and STAT labs were normal  Will have him reduce Metformin to 1050m XR once a day Glipizide to 1/2 tablet daily Continue januvia and Trulicity Keep record of the CBG, f/u in office in 2 weeks  Diarrhea may have been viral or associated with the very high spike in his glucose. Normal CBC, no fever and benign abdominal exam       [facility]18 [facility].  Hemoglobin A1c was 8.0 in September after being 10.1 in May.    Patient states that he has been taking his medications as prescribed and is listed in his medicine list below.  He states that his fasting blood sugars are now between 80 and 90.  His 2-hour postprandial sugars are between 140 and 150.  He continues to have numbness and tingling and pain radiating down his left leg into his left foot and in between his big toe and second toe as well as the second and third toe.  Orthopedics believes he has a Morton's neuroma  between the second and third toe however the patient also has numbness and tingling and pain radiating up his left leg which I question may be diabetic neuropathy versus sciatica. Past Medical History:  Diagnosis Date  . De Quervain's tenosynovitis, left 03/2011  . Diabetes mellitus    NIDDM  . Hyperlipidemia   . Hypertension    under control; has been on med. x 4 yrs.  . Nonproliferative retinopathy due to secondary diabetes ([facility]    Past Surgical History:  Procedure Laterality Date  . CARPAL TUNNEL RELEASE     bilat.  . DORSAL COMPARTMENT RELEASE  03/14/2011   Procedure: RELEASE DORSAL COMPARTMENT (DEQUERVAIN);  Surgeon: WPaulene Floor MD;  [place] [facility]  Service: Orthopedics;  Laterality: Left;  left wrist APL and EPL tenosynovectomy and 1st dorsal compartment release  . TONSILLECTOMY AND ADENOIDECTOMY  as a child  . VASECTOMY     Current Outpatient Medications on File Prior to Visit  Medication Sig Dispense Refill  . albuterol (PROVENTIL HFA;VENTOLIN HFA) 108 (90 Base) MCG/ACT inhaler Inhale 2 puffs into the lungs every 4 (four) hours as needed for wheezing or shortness of breath. 1 Inhaler 0  . aspirin 81 MG tablet Take 81 mg by mouth daily.    .Marland Kitchenatorvastatin (LIPITOR) 40 MG  tablet TAKE 1 TABLET BY MOUTH EVERY DAY 90 tablet 1  . bisoprolol-hydrochlorothiazide (ZIAC) 10-6.25 MG tablet TAKE 1 TABLET BY MOUTH EVERY DAY IN THE MORNING 90 tablet 3  . blood glucose meter kit and supplies KIT Dispense based on patient and insurance preference. Use twice daily times daily as directed. (FOR ICD-10  E11.65) 1 each 0  . Choline Fenofibrate (FENOFIBRIC ACID) 135 MG CPDR TAKE 1 CAPSULE BY MOUTH EVERY DAY IN THE EVENING 90 capsule 1  . gabapentin (NEURONTIN) 300 MG capsule Take 1 capsule (300 mg total) by mouth 3 (three) times daily. 90 capsule 3  . glipiZIDE (GLUCOTROL XL) 10 MG 24 hr tablet TAKE 1 TABLET BY MOUTH EVERY DAY 90 tablet 1  . glucose blood test strip  Dispense based on patient and insurance preference.  Use twice daily as directed. (FOR ICD-10 E11.65) 100 each 11  . JANUVIA 100 MG tablet TAKE 1 TABLET BY MOUTH ONCE DAILY 90 tablet 1  . Lancets Thin MISC Check BS BID 100 each 5  . metFORMIN (GLUCOPHAGE-XR) 500 MG 24 hr tablet TAKE 4 TABLETS BY MOUTH ONCE DAILY WITH BREAKFAST 360 tablet 1  . TRUEPLUS LANCETS 33G MISC 1 each by Does not apply route 2 (two) times daily. 100 each 11  . TRULICITY 1.5 KG/8.8PJ SOPN INJECT 1.5 MG SUBCUTANEOUSLY INTO THE SKIN ONCE A WEEK. 2 pen 2   No current facility-administered medications on file prior to visit.    Allergies  Allergen Reactions  . Niaspan [Niacin] Rash   Social History   Socioeconomic History  . Marital status: Married    Spouse name: Not on file  . Number of children: Not on file  . Years of education: Not on file  . Highest education level: Not on file  Occupational History  . Not on file  Social Needs  . Financial resource strain: Not on file  . Food insecurity:    Worry: Not on file    Inability: Not on file  . Transportation needs:    Medical: Not on file    Non-medical: Not on file  Tobacco Use  . Smoking status: Former Smoker    Packs/day: 3.00    Years: 6.00    Pack years: 18.00    Types: Cigarettes    Last attempt to quit: 02/05/1999    Years since quitting: 18.9  . Smokeless tobacco: Never Used  Substance and Sexual Activity  . Alcohol use: No    Alcohol/week: 0.0 standard drinks  . Drug use: No  . Sexual activity: Not on file  Lifestyle  . Physical activity:    Days per week: Not on file    Minutes per session: Not on file  . Stress: Not on file  Relationships  . Social connections:    Talks on phone: Not on file    Gets together: Not on file    Attends religious service: Not on file    Active member of club or organization: Not on file    Attends meetings of clubs or organizations: Not on file    Relationship status: Not on file  . Intimate partner  violence:    Fear of current or ex partner: Not on file    Emotionally abused: Not on file    Physically abused: Not on file    Forced sexual activity: Not on file  Other Topics Concern  . Not on file  Social History Narrative  . Not on file  Review of Systems  All other systems reviewed and are negative.      Objective:   Physical Exam Vitals signs reviewed.  Constitutional:      General: He is not in acute distress.    Appearance: Normal appearance. He is not ill-appearing, toxic-appearing or diaphoretic.  HENT:     Right Ear: Tympanic membrane and ear canal normal.     Left Ear: Tympanic membrane and ear canal normal.     Nose: Nose normal. No congestion or rhinorrhea.     Mouth/Throat:     Mouth: Mucous membranes are moist.     Pharynx: Oropharynx is clear. No oropharyngeal exudate or posterior oropharyngeal erythema.  Eyes:     General:        Right eye: No discharge.        Left eye: No discharge.     Conjunctiva/sclera: Conjunctivae normal.  Cardiovascular:     Rate and Rhythm: Normal rate and regular rhythm.     Pulses: Normal pulses.     Heart sounds: Normal heart sounds. No murmur.  Pulmonary:     Effort: Pulmonary effort is normal. No respiratory distress.     Breath sounds: Normal breath sounds. No stridor. No wheezing, rhonchi or rales.  Lymphadenopathy:     Cervical: No cervical adenopathy.  Neurological:     Mental Status: He is alert.           Assessment & Plan:  Type 2 diabetes mellitus with hyperglycemia, without long-term current use of insulin (HCC) - Plan: CBC with Differential/Platelet, COMPLETE METABOLIC PANEL WITH GFR, Lipid panel, Microalbumin, urine, Hemoglobin A1c  Essential hypertension, benign  Pure hypercholesterolemia  Type 2 diabetes mellitus with diabetic neuropathy, without long-term current use of insulin ([place]) - Plan: Vitamin B12  Blood pressure today is well controlled.  I will check a CBC, CMP, fasting lipid  panel, urine microalbumin, and hemoglobin A1c.  I encourage compliance with medication.  I discussed the long-term risk of poorly controlled diabetes due to medication noncompliance including but not limited to amputation of the extremities, kidney failure, blindness, macrovascular complications such as heart attacks and strokes.  Patient's reported blood sugars over the last 2 to 3 weeks and well controlled.  However I suspect his hemoglobin A1c will still be elevated given the fact the previous 2 months prior to that he was not consistently taking his medication.  He also has some rhinorrhea head congestion sore throat and cough however his exam is completely normal.  It has been less than 24 hours in duration.  I suspect that he has a viral upper respiratory infection.  I recommended supportive care with Coricidin HBP.  Given the neuropathy in his leg, I have also recommended checking a B12 level.  I would also recommend nerve conduction studies prior to any surgery to determine if in fact his widespread neuropathic symptoms are truly all limited to a Morton's neuroma

## 2018-01-30 LAB — LIPID PANEL
Cholesterol: 161 mg/dL (ref <200)
HDL: 28 mg/dL — ABNORMAL LOW (ref >40)
LDL Cholesterol (Calc): 104 mg/dL (calc) — ABNORMAL HIGH
Non-HDL Cholesterol (Calc): 133 mg/dL (calc) — ABNORMAL HIGH (ref <130)
TRIGLYCERIDES: 176 mg/dL — AB (ref <150)
Total CHOL/HDL Ratio: 5.8 (calc) — ABNORMAL HIGH (ref <5.0)

## 2018-01-30 LAB — CBC WITH DIFFERENTIAL/PLATELET
ABSOLUTE MONOCYTES: 502 {cells}/uL (ref 200–950)
BASOS ABS: 79 {cells}/uL (ref 0–200)
Basophils Relative: 1.2 %
EOS ABS: 284 {cells}/uL (ref 15–500)
Eosinophils Relative: 4.3 %
HCT: 47.1 % (ref 38.5–50.0)
Hemoglobin: 16.1 g/dL (ref 13.2–17.1)
Lymphs Abs: 2178 cells/uL (ref 850–3900)
MCH: 30.3 pg (ref 27.0–33.0)
MCHC: 34.2 g/dL (ref 32.0–36.0)
MCV: 88.7 fL (ref 80.0–100.0)
MPV: 9.6 fL (ref 7.5–12.5)
Monocytes Relative: 7.6 %
NEUTROS PCT: 53.9 %
Neutro Abs: 3557 cells/uL (ref 1500–7800)
PLATELETS: 287 10*3/uL (ref 140–400)
RBC: 5.31 10*6/uL (ref 4.20–5.80)
RDW: 11.9 % (ref 11.0–15.0)
TOTAL LYMPHOCYTE: 33 %
WBC: 6.6 10*3/uL (ref 3.8–10.8)

## 2018-01-30 LAB — COMPLETE METABOLIC PANEL WITH GFR
AG RATIO: 1.5 (calc) (ref 1.0–2.5)
ALT: 24 U/L (ref 9–46)
AST: 18 U/L (ref 10–40)
Albumin: 4.4 g/dL (ref 3.6–5.1)
Alkaline phosphatase (APISO): 71 U/L (ref 40–115)
BILIRUBIN TOTAL: 0.4 mg/dL (ref 0.2–1.2)
BUN: 11 mg/dL (ref 7–25)
CHLORIDE: 99 mmol/L (ref 98–110)
CO2: 28 mmol/L (ref 20–32)
Calcium: 9.4 mg/dL (ref 8.6–10.3)
Creat: 0.94 mg/dL (ref 0.60–1.35)
GFR, EST AFRICAN AMERICAN: 115 mL/min/{1.73_m2} (ref >=60)
GFR, Est Non African American: 100 mL/min/{1.73_m2} (ref >=60)
Globulin: 2.9 g/dL (calc) (ref 1.9–3.7)
Glucose, Bld: 131 mg/dL — ABNORMAL HIGH (ref 65–99)
POTASSIUM: 4.2 mmol/L (ref 3.5–5.3)
Sodium: 137 mmol/L (ref 135–146)
TOTAL PROTEIN: 7.3 g/dL (ref 6.1–8.1)

## 2018-01-30 LAB — MICROALBUMIN, URINE: Microalb, Ur: 0.2 mg/dL

## 2018-01-30 LAB — HEMOGLOBIN A1C
Hgb A1c MFr Bld: 9.1 % of total Hgb — ABNORMAL HIGH (ref <5.7)
Mean Plasma Glucose: 214 (calc)
eAG (mmol/L): 11.9 (calc)

## 2018-01-30 LAB — VITAMIN B12: VITAMIN B 12: 412 pg/mL (ref 200–1100)

## 2018-02-02 ENCOUNTER — Encounter: Payer: Self-pay | Admitting: Family Medicine

## 2018-02-03 ENCOUNTER — Encounter: Payer: Self-pay | Admitting: Family Medicine

## 2018-02-10 ENCOUNTER — Other Ambulatory Visit: Payer: Self-pay | Admitting: Family Medicine

## 2018-02-10 NOTE — Telephone Encounter (Signed)
Pt son was dx with flu and needs tamiflu called [facility] [address] [place].

## 2018-02-11 MED ORDER — OSELTAMIVIR PHOSPHATE 75 MG PO CAPS: 75.0000 mg | ORAL_CAPSULE | Freq: Every day | ORAL | 0 refills | Status: DC

## 2018-02-11 NOTE — Telephone Encounter (Signed)
Prescription sent to pharmacy. .   Call placed to patient and patient made aware.  

## 2018-03-03 ENCOUNTER — Telehealth: Payer: Self-pay | Admitting: Family Medicine

## 2018-03-03 DIAGNOSIS — M5416 Radiculopathy, lumbar region: Secondary | ICD-10-CM

## 2018-03-03 DIAGNOSIS — E1142 Type 2 diabetes mellitus with diabetic polyneuropathy: Secondary | ICD-10-CM

## 2018-03-03 NOTE — Telephone Encounter (Signed)
Pt is calling requesting nerve study order be put in per pickards last visit with him.

## 2018-03-05 NOTE — Telephone Encounter (Signed)
EMG NCS- Diagnosis- lumbar radiculopathy, diabetic polyneuropathy

## 2018-03-05 NOTE — Telephone Encounter (Signed)
Referral placed.

## 2018-03-05 NOTE — Telephone Encounter (Signed)
OK to order? If so what diagnosis?

## 2018-03-16 ENCOUNTER — Other Ambulatory Visit: Payer: Worker's Compensation

## 2018-03-16 ENCOUNTER — Ambulatory Visit: Payer: BC Managed Care – PPO | Admitting: Family Medicine

## 2018-03-16 DIAGNOSIS — S51851A Open bite of right forearm, initial encounter: Secondary | ICD-10-CM

## 2018-03-16 DIAGNOSIS — W503XXA Accidental bite by another person, initial encounter: Principal | ICD-10-CM

## 2018-03-17 LAB — HEPATITIS PANEL, ACUTE
Hep A IgM: NONREACTIVE
Hep B C IgM: NONREACTIVE
Hepatitis B Surface Ag: NONREACTIVE
Hepatitis C Ab: NONREACTIVE
SIGNAL TO CUT-OFF: 0.06 (ref <1.00)

## 2018-03-17 LAB — HIV ANTIBODY (ROUTINE TESTING W REFLEX): HIV: NONREACTIVE

## 2018-04-08 ENCOUNTER — Ambulatory Visit (INDEPENDENT_AMBULATORY_CARE_PROVIDER_SITE_OTHER): Payer: BC Managed Care – PPO | Admitting: Neurology

## 2018-04-08 ENCOUNTER — Encounter: Payer: BC Managed Care – PPO | Admitting: Neurology

## 2018-04-08 DIAGNOSIS — Z0289 Encounter for other administrative examinations: Secondary | ICD-10-CM

## 2018-04-08 DIAGNOSIS — R200 Anesthesia of skin: Secondary | ICD-10-CM

## 2018-04-08 NOTE — Procedures (Signed)
Full Name: Matthew Osborne Gender: Male MRN #: [id number] Date of Birth: [birth year]    Visit Date: 04/08/2018 11:25 Age: 43 Years 89 Months Old Examining Physician: Marcial Pacas, MD  Referring Physician: Jenna Luo, MD History: 43 year old male with poorly controlled diabetes, presenting with few months history of bilateral feet paresthesia  Summary of the tests: Nerve conduction study: Bilateral sural, superficial peroneal sensory responses showed mild to moderate decreased snap amplitude. Lateral tibial, to EDB motor response showed normal distal latency, C map amplitude.  Right ulnar sensory and responses were normal.  Electromyography: Selective needle examination of bilateral lower extremity muscles, lumbosacral paraspinal muscles were normal.  Conclusion: This is an abnormal study.  There is electrodiagnostic evidence of mild length dependent axonal peripheral neuropathy.  There is no evidence of bilateral lumbosacral radiculopathy.    ------------------------------- Marcial Pacas, M.D. PhD  [facility] [place], [place] [address] Tel: [phone] Fax: [phone]        [facility]    Nerve / Sites Muscle Latency Ref. Amplitude Ref. Rel Amp Segments Distance Velocity Ref. Area    ms ms mV mV %  cm m/s m/s mVms  R Ulnar - ADM     Wrist ADM 2.9 ?3.3 11.3 ?6.0 100 Wrist - ADM 7   41.1     B.Elbow ADM 7.3  11.1  98.2 B.Elbow - Wrist 23 52 ?49 41.6     A.Elbow ADM 9.3  10.0  90.2 A.Elbow - B.Elbow 10 51 ?49 38.9         A.Elbow - Wrist      R Peroneal - EDB     Ankle EDB 4.4 ?6.5 5.9 ?2.0 100 Ankle - EDB 9   16.8     Fib head EDB 13.2  4.4  74.6 Fib head - Ankle 33 38 ?44 15.4     Pop fossa EDB 15.8  4.2  95 Pop fossa - Fib head 10 38 ?44 14.9         Pop fossa - Ankle      L Peroneal - EDB     Ankle EDB 4.4 ?6.5 5.1 ?2.0 100 Ankle - EDB 9   18.2     Fib head EDB 12.6  3.9  77.9 Fib head - Ankle 34 42 ?44 16.3     Pop fossa EDB 15.1  3.6   91.9 Pop fossa - Fib head 10 40 ?44 15.4         Pop fossa - Ankle      R Tibial - AH     Ankle AH 4.1 ?5.8 6.7 ?4.0 100 Ankle - AH 9   21.6     Pop fossa AH 15.4  6.0  90.1 Pop fossa - Ankle 42 37 ?41 21.3  L Tibial - AH     Ankle AH 4.8 ?5.8 9.6 ?4.0 100 Ankle - AH 9   28.4     Pop fossa AH 16.3  8.4  86.6 Pop fossa - Ankle 41 36 ?41 26.7               SNC    Nerve / Sites Rec. Site Peak Lat Ref.  Amp Ref. Segments Distance    ms ms V V  cm  R Sural - Ankle (Calf)     Calf Ankle 4.0 ?4.4 4 ?6 Calf - Ankle 14  L Sural - Ankle (Calf)     Calf Ankle 4.0 ?4.4  3 ?6 Calf - Ankle 14  R Superficial peroneal - Ankle     Lat leg Ankle 4.2 ?4.4 3 ?6 Lat leg - Ankle 14  L Superficial peroneal - Ankle     Lat leg Ankle 4.4 ?4.4 3 ?6 Lat leg - Ankle 14  R Ulnar - Orthodromic, (Dig V, Mid palm)     Dig V Wrist 2.8 ?3.1 6 ?5 Dig V - Wrist 48                F  Wave    Nerve F Lat Ref.   ms ms  R Tibial - AH 60.3 ?56.0  L Tibial - AH 59.3 ?56.0  R Ulnar - ADM 32.0 ?32.0           EMG       EMG Summary Table    Spontaneous MUAP Recruitment  Muscle IA Fib PSW Fasc Other Amp Dur. Poly Pattern  R. Tibialis anterior Normal None None None _______ Normal Normal Normal Normal  R. Tibialis posterior Normal None None None _______ Normal Normal Normal Normal  R. Peroneus longus Normal None None None _______ Normal Normal Normal Normal  R. Gastrocnemius (Medial head) Normal None None None _______ Normal Normal Normal Normal  R. Vastus lateralis Normal None None None _______ Normal Normal Normal Normal  L. Tibialis anterior Normal None None None _______ Normal Normal Normal Normal  L. Tibialis posterior Normal None None None _______ Normal Normal Normal Normal  L. Peroneus longus Normal None None None _______ Normal Normal Normal Normal  L. Gastrocnemius (Medial head) Normal None None None _______ Normal Normal Normal Normal  L. Vastus lateralis Normal None None None _______ Normal Normal Normal  Normal  R. Lumbar paraspinals (mid) Normal None None None _______ Normal Normal Normal Normal  R. Lumbar paraspinals (low) Normal None None None _______ Normal Normal Normal Normal  L. Lumbar paraspinals (mid) Normal None None None _______ Normal Normal Normal Normal  L. Lumbar paraspinals (low) Normal None None None _______ Normal Normal Normal Normal

## 2018-04-09 NOTE — Progress Notes (Signed)
NCS shows peripheral neuropathy likely from diabetes, no evidence of pinched nerve in his back.

## 2018-04-15 NOTE — Progress Notes (Signed)
Patient aware of results via mychart 

## 2018-04-27 ENCOUNTER — Other Ambulatory Visit: Payer: Self-pay | Admitting: Family Medicine

## 2018-06-01 ENCOUNTER — Other Ambulatory Visit: Payer: Self-pay | Admitting: *Deleted

## 2018-07-01 ENCOUNTER — Other Ambulatory Visit: Payer: Self-pay | Admitting: Family Medicine

## 2018-07-01 MED ORDER — ATORVASTATIN CALCIUM 40 MG PO TABS: 40.0000 mg | ORAL_TABLET | Freq: Every day | ORAL | 1 refills | Status: DC

## 2018-07-01 MED ORDER — SITAGLIPTIN PHOSPHATE 100 MG PO TABS: 100.0000 mg | ORAL_TABLET | Freq: Every day | ORAL | 1 refills | Status: DC

## 2018-07-01 MED ORDER — METFORMIN HCL ER 500 MG PO TB24: ORAL_TABLET | ORAL | 1 refills | Status: DC

## 2018-07-01 MED ORDER — GLIPIZIDE ER 10 MG PO TB24: 10.0000 mg | ORAL_TABLET | Freq: Every day | ORAL | 1 refills | Status: DC

## 2018-07-16 ENCOUNTER — Other Ambulatory Visit: Payer: Self-pay

## 2018-07-16 ENCOUNTER — Other Ambulatory Visit: Payer: Self-pay | Admitting: Family Medicine

## 2018-07-16 ENCOUNTER — Ambulatory Visit: Payer: BC Managed Care – PPO | Admitting: Family Medicine

## 2018-07-16 VITALS — BP 142/86 | HR 89 | Temp 98.0°F | Resp 18 | Ht 74.0 in | Wt 265.0 lb

## 2018-07-16 DIAGNOSIS — E7800 Pure hypercholesterolemia, unspecified: Secondary | ICD-10-CM

## 2018-07-16 DIAGNOSIS — E1165 Type 2 diabetes mellitus with hyperglycemia: Secondary | ICD-10-CM | POA: Diagnosis not present

## 2018-07-16 DIAGNOSIS — I10 Essential (primary) hypertension: Secondary | ICD-10-CM

## 2018-07-16 NOTE — Progress Notes (Signed)
Subjective:    Patient ID: Matthew Osborne, male    DOB: [birth year], 43 y.o.   MRN: [id number]  HPI Patient is here today for follow-up of his diabetes.  Unfortunately it seems as though he has developed diabetic neuropathy.  He reports bee sting pain in the toes of his left foot and in the toes of his right foot.  The nerve pain will radiate up to his mid calf on his left leg.  If he takes gabapentin 300 mg 3 times a day, it will take the edge off and dull the pain.  He denies any side effects from the gabapentin.  He denies any dizziness weight gain or leg swelling or sedation.  He denies any chest pain shortness of breath or dyspnea on exertion.  His blood sugars fasting in the morning range between 80 and 140.  His 2-hour postprandial sugars are between 160 and 180.  He is only seen one blood sugar greater than 200 although he admits that he is not checking it every day.  He denies any polyuria polydipsia or blurry vision.  Diabetic foot exam is performed today and is normal with good pulses.  Blood pressure today is fairly controlled at 142/86.  He is on a combination of Trulicity and Januvia which I question the utility of combining these 2 medications or at least the cost effectiveness Past Medical History:  Diagnosis Date  . De Quervain's tenosynovitis, left 03/2011  . Diabetes mellitus    NIDDM  . Hyperlipidemia   . Hypertension    under control; has been on med. x 4 yrs.  . Nonproliferative retinopathy due to secondary diabetes [facility])    Past Surgical History:  Procedure Laterality Date  . CARPAL TUNNEL RELEASE     bilat.  . DORSAL COMPARTMENT RELEASE  03/14/2011   Procedure: RELEASE DORSAL COMPARTMENT (DEQUERVAIN);  Surgeon: Paulene Floor, MD;  Location: [place];  Service: Orthopedics;  Laterality: Left;  left wrist APL and EPL tenosynovectomy and 1st dorsal compartment release  . TONSILLECTOMY AND ADENOIDECTOMY  as a child  . VASECTOMY     Current Outpatient  Medications on File Prior to Visit  Medication Sig Dispense Refill  . albuterol (PROVENTIL HFA;VENTOLIN HFA) 108 (90 Base) MCG/ACT inhaler Inhale 2 puffs into the lungs every 4 (four) hours as needed for wheezing or shortness of breath. 1 Inhaler 0  . aspirin 81 MG tablet Take 81 mg by mouth daily.    [name] atorvastatin (LIPITOR) 40 MG tablet Take 1 tablet (40 mg total) by mouth daily. 90 tablet 1  . bisoprolol-hydrochlorothiazide (ZIAC) 10-6.25 MG tablet TAKE 1 TABLET BY MOUTH EVERY DAY IN THE MORNING 90 tablet 3  . blood glucose meter kit and supplies KIT Dispense based on patient and insurance preference. Use twice daily times daily as directed. (FOR ICD-10  E11.65) 1 each 0  . Choline Fenofibrate (FENOFIBRIC ACID) 135 MG CPDR TAKE 1 CAPSULE BY MOUTH EVERY DAY IN THE EVENING 90 capsule 1  . gabapentin (NEURONTIN) 300 MG capsule Take 1 capsule (300 mg total) by mouth 3 (three) times daily. 90 capsule 3  . glipiZIDE (GLUCOTROL XL) 10 MG 24 hr tablet Take 1 tablet (10 mg total) by mouth daily. 90 tablet 1  . glucose blood test strip Dispense based on patient and insurance preference.  Use twice daily as directed. (FOR ICD-10 E11.65) 100 each 11  . Lancets Thin MISC Check BS BID 100 each 5  .  metFORMIN (GLUCOPHAGE-XR) 500 MG 24 hr tablet TAKE 4 TABLETS BY MOUTH ONCE DAILY WITH BREAKFAST 360 tablet 1  . sitaGLIPtin (JANUVIA) 100 MG tablet Take 1 tablet (100 mg total) by mouth daily. 90 tablet 1  . TRUEPLUS LANCETS 33G MISC 1 each by Does not apply route 2 (two) times daily. 100 each 11  . TRULICITY 1.5 OQ/9.4TM SOPN INJECT 1.5 MG SUBCUTANEOUSLY INTO THE SKIN ONCE A WEEK 4 pen 2   No current facility-administered medications on file prior to visit.    Allergies  Allergen Reactions  . Niaspan [Niacin] Rash   Social History   Socioeconomic History  . Marital status: Married    Spouse name: Not on file  . Number of children: Not on file  . Years of education: Not on file  . Highest education  level: Not on file  Occupational History  . Not on file  Social Needs  . Financial resource strain: Not on file  . Food insecurity    Worry: Not on file    Inability: Not on file  . Transportation needs    Medical: Not on file    Non-medical: Not on file  Tobacco Use  . Smoking status: Former Smoker    Packs/day: 3.00    Years: 6.00    Pack years: 18.00    Types: Cigarettes    Quit date: 02/05/1999    Years since quitting: 19.4  . Smokeless tobacco: Never Used  Substance and Sexual Activity  . Alcohol use: No    Alcohol/week: 0.0 standard drinks  . Drug use: No  . Sexual activity: Not on file  Lifestyle  . Physical activity    Days per week: Not on file    Minutes per session: Not on file  . Stress: Not on file  Relationships  . Social Herbalist on phone: Not on file    Gets together: Not on file    Attends religious service: Not on file    Active member of club or organization: Not on file    Attends meetings of clubs or organizations: Not on file    Relationship status: Not on file  . Intimate partner violence    Fear of current or ex partner: Not on file    Emotionally abused: Not on file    Physically abused: Not on file    Forced sexual activity: Not on file  Other Topics Concern  . Not on file  Social History Narrative  . Not on file      Review of Systems  All other systems reviewed and are negative.      Objective:   Physical Exam Vitals signs reviewed.  Constitutional:      General: He is not in acute distress.    Appearance: Normal appearance. He is not ill-appearing, toxic-appearing or diaphoretic.  HENT:     Right Ear: Tympanic membrane and ear canal normal.     Left Ear: Tympanic membrane and ear canal normal.     Nose: Nose normal. No congestion or rhinorrhea.     Mouth/Throat:     Mouth: Mucous membranes are moist.     Pharynx: Oropharynx is clear. No oropharyngeal exudate or posterior oropharyngeal erythema.  Eyes:      General:        Right eye: No discharge.        Left eye: No discharge.     Conjunctiva/sclera: Conjunctivae normal.  Cardiovascular:     Rate and  Rhythm: Normal rate and regular rhythm.     Pulses: Normal pulses.     Heart sounds: Normal heart sounds. No murmur.  Pulmonary:     Effort: Pulmonary effort is normal. No respiratory distress.     Breath sounds: Normal breath sounds. No stridor. No wheezing, rhonchi or rales.  Lymphadenopathy:     Cervical: No cervical adenopathy.  Neurological:     Mental Status: He is alert.           Assessment & Plan:  Please see the patient's diagnosis for the visit  Diabetes sounds reportedly well controlled.  Based on his sugars I expect his hemoglobin A1c to be below 7.  If below 7 I would recommend discontinuing Januvia for cost effectiveness.  Blood pressure today is acceptable.  Unfortunately I do believe that the patient has developed diabetic neuropathy but at least the gabapentin is controlling it.  The patient could increase up to 600 mg 3 times a day if necessary.  I will check fasting lipid panel.  Goal LDL cholesterol is less than 100.  I encourage weight loss.  I like to see the patient below 230 pounds.  I recommended regular aerobic exercise.

## 2018-07-16 NOTE — Addendum Note (Signed)
Addended by: Launa Grill on: 07/16/2018 08:52 AM   Modules accepted: Orders

## 2018-07-16 NOTE — Addendum Note (Signed)
Addended by: Sheral Flow on: 07/16/2018 09:27 AM   Modules accepted: Orders

## 2018-07-17 LAB — COMPLETE METABOLIC PANEL WITH GFR
AG Ratio: 1.5 (calc) (ref 1.0–2.5)
ALT: 43 U/L (ref 9–46)
AST: 31 U/L (ref 10–40)
Albumin: 4.4 g/dL (ref 3.6–5.1)
Alkaline phosphatase (APISO): 44 U/L (ref 36–130)
BUN: 15 mg/dL (ref 7–25)
CO2: 25 mmol/L (ref 20–32)
Calcium: 9.6 mg/dL (ref 8.6–10.3)
Chloride: 103 mmol/L (ref 98–110)
Creat: 1.12 mg/dL (ref 0.60–1.35)
GFR, Est African American: 93 mL/min/{1.73_m2} (ref >=60)
GFR, Est Non African American: 80 mL/min/{1.73_m2} (ref >=60)
Globulin: 2.9 g/dL (calc) (ref 1.9–3.7)
Glucose, Bld: 102 mg/dL — ABNORMAL HIGH (ref 65–99)
Potassium: 4.4 mmol/L (ref 3.5–5.3)
Sodium: 139 mmol/L (ref 135–146)
Total Bilirubin: 0.6 mg/dL (ref 0.2–1.2)
Total Protein: 7.3 g/dL (ref 6.1–8.1)

## 2018-07-17 LAB — CBC WITH DIFFERENTIAL/PLATELET
Absolute Monocytes: 605 cells/uL (ref 200–950)
Basophils Absolute: 68 cells/uL (ref 0–200)
Basophils Relative: 1 %
Eosinophils Absolute: 292 cells/uL (ref 15–500)
Eosinophils Relative: 4.3 %
HCT: 46.6 % (ref 38.5–50.0)
Hemoglobin: 15.7 g/dL (ref 13.2–17.1)
Lymphs Abs: 2421 cells/uL (ref 850–3900)
MCH: 30.3 pg (ref 27.0–33.0)
MCHC: 33.7 g/dL (ref 32.0–36.0)
MCV: 90 fL (ref 80.0–100.0)
MPV: 9.8 fL (ref 7.5–12.5)
Monocytes Relative: 8.9 %
Neutro Abs: 3414 cells/uL (ref 1500–7800)
Neutrophils Relative %: 50.2 %
Platelets: 291 10*3/uL (ref 140–400)
RBC: 5.18 10*6/uL (ref 4.20–5.80)
RDW: 12.8 % (ref 11.0–15.0)
Total Lymphocyte: 35.6 %
WBC: 6.8 10*3/uL (ref 3.8–10.8)

## 2018-07-17 LAB — MICROALBUMIN / CREATININE URINE RATIO
Creatinine, Urine: 164 mg/dL (ref 20–320)
Microalb Creat Ratio: 3 mcg/mg creat (ref <30)
Microalb, Ur: 0.5 mg/dL

## 2018-07-17 LAB — LIPID PANEL
Cholesterol: 107 mg/dL (ref <200)
HDL: 23 mg/dL — ABNORMAL LOW (ref >=40)
LDL Cholesterol (Calc): 65 mg/dL (calc)
Non-HDL Cholesterol (Calc): 84 mg/dL (calc) (ref <130)
Total CHOL/HDL Ratio: 4.7 (calc) (ref <5.0)
Triglycerides: 109 mg/dL (ref <150)

## 2018-07-17 LAB — HEMOGLOBIN A1C
Hgb A1c MFr Bld: 7.1 % of total Hgb — ABNORMAL HIGH (ref <5.7)
Mean Plasma Glucose: 157 (calc)
eAG (mmol/L): 8.7 (calc)

## 2018-08-21 ENCOUNTER — Other Ambulatory Visit: Payer: Self-pay | Admitting: Family Medicine

## 2018-08-21 MED ORDER — GABAPENTIN 300 MG PO CAPS: 300.0000 mg | ORAL_CAPSULE | Freq: Three times a day (TID) | ORAL | 3 refills | Status: DC

## 2018-08-28 ENCOUNTER — Other Ambulatory Visit: Payer: Self-pay | Admitting: Family Medicine

## 2018-10-09 ENCOUNTER — Other Ambulatory Visit: Payer: Self-pay | Admitting: Family Medicine

## 2018-10-16 ENCOUNTER — Ambulatory Visit (INDEPENDENT_AMBULATORY_CARE_PROVIDER_SITE_OTHER): Payer: BC Managed Care – PPO | Admitting: Family Medicine

## 2018-10-16 ENCOUNTER — Other Ambulatory Visit: Payer: Self-pay

## 2018-10-16 ENCOUNTER — Encounter: Payer: Self-pay | Admitting: Family Medicine

## 2018-10-16 VITALS — BP 128/88 | HR 85 | Temp 97.8°F | Ht 73.5 in | Wt 242.0 lb

## 2018-10-16 DIAGNOSIS — R319 Hematuria, unspecified: Secondary | ICD-10-CM

## 2018-10-16 DIAGNOSIS — M549 Dorsalgia, unspecified: Secondary | ICD-10-CM | POA: Diagnosis not present

## 2018-10-16 DIAGNOSIS — G5622 Lesion of ulnar nerve, left upper limb: Secondary | ICD-10-CM

## 2018-10-16 LAB — URINALYSIS, ROUTINE W REFLEX MICROSCOPIC
Bilirubin Urine: NEGATIVE
Glucose, UA: NEGATIVE
Hgb urine dipstick: NEGATIVE
Ketones, ur: NEGATIVE
Leukocytes,Ua: NEGATIVE
Nitrite: NEGATIVE
Protein, ur: NEGATIVE
Specific Gravity, Urine: 1.025 (ref 1.001–1.03)
pH: 5 (ref 5.0–8.0)

## 2018-10-16 MED ORDER — MELOXICAM 15 MG PO TABS: 15.0000 mg | ORAL_TABLET | Freq: Every day | ORAL | 0 refills | Status: DC

## 2018-10-16 MED ORDER — DICLOFENAC SODIUM 1 % TD GEL: 2.0000 g | Freq: Four times a day (QID) | TRANSDERMAL | 1 refills | Status: DC

## 2018-10-16 NOTE — Progress Notes (Signed)
Subjective:    Patient ID: Matthew Osborne, male    DOB: 10-12-1975, 43 y.o.   MRN: [id number]  HPI Patient presents with 2 concerns.  Patient has been gradually developing pain in his left elbow.  Pain is located at the medial epicondyle.  That area is extremely tender to palpation well out of proportion to physical exam findings.  The patient has hyperesthesias in that area.  Gentle stroking of the skin elicit severe pain in the patient will literally jump from me touching.  There is no redness.  Gentle percussion against the bone using my index finger elicits severe pain and causes the patient to jump and say ouch.  He denies any numbness or tingling radiating down his arm.  He denies any injury to the area.  He has normal range of motion in the elbow.  There is no tenderness to palpation over the flexor tendon group distal to the medial epicondyle.  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  He felt nauseated.  He had to go home.  That night he had a pink tinge to his urine.  He continues to have pain in his back today.  He denies any dysuria.  Urinalysis is completely normal.  Gentle palpation of the lumbar paraspinal muscles causes the patient to jump and reports significant pain to palpation in the area diagrammed Past Medical History:  Diagnosis Date  . De Quervain's tenosynovitis, left 03/2011  . Diabetes mellitus    NIDDM  . Hyperlipidemia   . Hypertension    under control; has been on med. x 4 yrs.  . Nonproliferative retinopathy due to secondary diabetes [facility])    Past Surgical History:  Procedure Laterality Date  . CARPAL TUNNEL RELEASE     bilat.  . DORSAL COMPARTMENT RELEASE  03/14/2011   Procedure: RELEASE DORSAL COMPARTMENT (DEQUERVAIN);  Surgeon: Paulene Floor, MD;  Location: [place];  Service: Orthopedics;  Laterality: Left;  left wrist APL and EPL tenosynovectomy and 1st dorsal compartment release   . TONSILLECTOMY AND ADENOIDECTOMY  as a child  . VASECTOMY     Current Outpatient Medications on File Prior to Visit  Medication Sig Dispense Refill  . albuterol (PROVENTIL HFA;VENTOLIN HFA) 108 (90 Base) MCG/ACT inhaler Inhale 2 puffs into the lungs every 4 (four) hours as needed for wheezing or shortness of breath. 1 Inhaler 0  . aspirin 81 MG tablet Take 81 mg by mouth daily.    [name] atorvastatin (LIPITOR) 40 MG tablet Take 1 tablet (40 mg total) by mouth daily. 90 tablet 1  . bisoprolol-hydrochlorothiazide (ZIAC) 10-6.25 MG tablet TAKE 1 TABLET BY MOUTH EVERY DAY IN THE MORNING 90 tablet 3  . blood glucose meter kit and supplies KIT Dispense based on patient and insurance preference. Use twice daily times daily as directed. (FOR ICD-10  E11.65) 1 each 0  . Choline Fenofibrate (FENOFIBRIC ACID) 135 MG CPDR TAKE 1 CAPSULE BY MOUTH EVERY DAY IN THE EVENING 30 capsule 5  . gabapentin (NEURONTIN) 300 MG capsule Take 1 capsule (300 mg total) by mouth 3 (three) times daily. 90 capsule 3  . glipiZIDE (GLUCOTROL XL) 10 MG 24 hr tablet Take 1 tablet (10 mg total) by mouth daily. 90 tablet 1  . glucose blood test strip Dispense based on patient and insurance preference.  Use twice daily as directed. (FOR ICD-10 E11.65) 100 each 11  . Lancets Thin MISC  Check BS BID 100 each 5  . metFORMIN (GLUCOPHAGE-XR) 500 MG 24 hr tablet TAKE 4 TABLETS BY MOUTH ONCE DAILY WITH BREAKFAST 360 tablet 1  . sitaGLIPtin (JANUVIA) 100 MG tablet Take 1 tablet (100 mg total) by mouth daily. 90 tablet 1  . TRUEPLUS LANCETS 33G MISC 1 each by Does not apply route 2 (two) times daily. 100 each 11  . TRULICITY 1.5 HQ/7.5FF SOPN INJECT 1.5 MG SUBCUTANEOUSLY INTO THE SKIN ONCE A WEEK 4 pen 3   No current facility-administered medications on file prior to visit.    Allergies  Allergen Reactions  . Niaspan [Niacin] Rash   Social History   Socioeconomic History  . Marital status: Married    Spouse name: Not on file  . Number  of children: Not on file  . Years of education: Not on file  . Highest education level: Not on file  Occupational History  . Not on file  Social Needs  . Financial resource strain: Not on file  . Food insecurity    Worry: Not on file    Inability: Not on file  . Transportation needs    Medical: Not on file    Non-medical: Not on file  Tobacco Use  . Smoking status: Former Smoker    Packs/day: 3.00    Years: 6.00    Pack years: 18.00    Types: Cigarettes    Quit date: 02/05/1999    Years since quitting: 19.7  . Smokeless tobacco: Never Used  Substance and Sexual Activity  . Alcohol use: No    Alcohol/week: 0.0 standard drinks  . Drug use: No  . Sexual activity: Not on file  Lifestyle  . Physical activity    Days per week: Not on file    Minutes per session: Not on file  . Stress: Not on file  Relationships  . Social Herbalist on phone: Not on file    Gets together: Not on file    Attends religious service: Not on file    Active member of club or organization: Not on file    Attends meetings of clubs or organizations: Not on file    Relationship status: Not on file  . Intimate partner violence    Fear of current or ex partner: Not on file    Emotionally abused: Not on file    Physically abused: Not on file    Forced sexual activity: Not on file  Other Topics Concern  . Not on file  Social History Narrative  . Not on file      Review of Systems  All other systems reviewed and are negative.      Objective:   Physical Exam Vitals signs reviewed.  Cardiovascular:     Rate and Rhythm: Normal rate and regular rhythm.     Pulses: Normal pulses.     Heart sounds: Normal heart sounds.  Abdominal:     General: Abdomen is flat. Bowel sounds are normal.     Palpations: Abdomen is soft.  Musculoskeletal:     Left elbow: He exhibits normal range of motion, no swelling, no effusion, no deformity and no laceration. Tenderness found. Medial epicondyle  tenderness noted. No radial head, no lateral epicondyle and no olecranon process tenderness noted.     Lumbar back: He exhibits tenderness, pain and spasm. He exhibits normal range of motion and no bony tenderness.       Back:  Assessment & Plan:  Acute back pain, unspecified back location, unspecified back pain laterality  Ulnar neuropathy at elbow of left upper extremity  Pain at the elbow is out of proportion to exam and really demonstrates hyperesthesia making me suspect that there may be nerve irritation around the medial epicondyle of the ulnar nerve.  I have recommended trying diclofenac gel 2 g applied to that area 4 times a day to calm inflammation and then to also take meloxicam 15 mg a day as an anti-inflammatory and then reassess in 2 weeks.  If no better consult orthopedics and likely obtain an x-ray of the elbow.  I believe the back pain represents a muscle spasm likely due to a lumbar muscle strain.  No evidence of urinary tract infection or kidney stone on today's urinalysis.  I recommended trying meloxicam.  Pain is reproduced with gentle palpation and therefore this appears to be musculoskeletal in nature.

## 2018-10-16 NOTE — Addendum Note (Signed)
Addended by: Shary Decamp B on: 10/16/2018 12:19 PM   Modules accepted: Orders

## 2018-10-22 ENCOUNTER — Telehealth: Payer: Self-pay | Admitting: Family Medicine

## 2018-10-22 MED ORDER — DICLOFENAC SODIUM 1 % TD GEL: 2.0000 g | Freq: Four times a day (QID) | TRANSDERMAL | 1 refills | Status: DC

## 2018-10-22 NOTE — Telephone Encounter (Signed)
PA Submitted through [email or web address] and received the following:  Denied through ins - pharm made aware and pt can obtain OTC

## 2018-10-24 ENCOUNTER — Other Ambulatory Visit: Payer: Self-pay | Admitting: Family Medicine

## 2018-11-25 ENCOUNTER — Other Ambulatory Visit: Payer: Self-pay | Admitting: Family Medicine

## 2018-12-11 ENCOUNTER — Other Ambulatory Visit: Payer: Self-pay | Admitting: Family Medicine

## 2019-02-12 ENCOUNTER — Other Ambulatory Visit: Payer: Self-pay | Admitting: Family Medicine

## 2019-04-19 ENCOUNTER — Encounter: Payer: Self-pay | Admitting: Family Medicine

## 2019-04-19 ENCOUNTER — Other Ambulatory Visit: Payer: Self-pay

## 2019-04-19 ENCOUNTER — Ambulatory Visit: Payer: BC Managed Care – PPO | Admitting: Family Medicine

## 2019-04-19 VITALS — BP 132/80 | HR 84 | Temp 97.2°F | Resp 16 | Ht 73.5 in | Wt 252.0 lb

## 2019-04-19 DIAGNOSIS — G5622 Lesion of ulnar nerve, left upper limb: Secondary | ICD-10-CM

## 2019-04-19 MED ORDER — GABAPENTIN 300 MG PO CAPS: 300.0000 mg | ORAL_CAPSULE | Freq: Three times a day (TID) | ORAL | 3 refills | Status: DC

## 2019-04-19 MED FILL — GABAPENTIN 300 MG CAPSULE: 300 | 30 days supply | Qty: 90 | Fill #0

## 2019-04-19 NOTE — Progress Notes (Signed)
Subjective:    Patient ID: Matthew Osborne, male    DOB: 28-Jun-1975, 44 y.o.   MRN: [id number]  HPI Patient presents with 2 concerns.  Patient has been gradually developing pain in his left elbow.  Pain is located at the medial epicondyle.  That area is extremely tender to palpation well out of proportion to physical exam findings.  The patient has hyperesthesias in that area.  Gentle stroking of the skin elicit severe pain in the patient will literally jump from me touching.  There is no redness.  Gentle percussion against the bone using my index finger elicits severe pain and causes the patient to jump and say ouch.  He denies any numbness or tingling radiating down his arm.  He denies any injury to the area.  He has normal range of motion in the elbow.  There is no tenderness to palpation over the flexor tendon group distal to the medial epicondyle.  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  He felt nauseated.  He had to go home.  That night he had a pink tinge to his urine.  He continues to have pain in his back today.  He denies any dysuria.  Urinalysis is completely normal.  Gentle palpation of the lumbar paraspinal muscles causes the patient to jump and reports significant pain to palpation in the area diagrammed Past Medical History:  Diagnosis Date  . De Quervain's tenosynovitis, left 03/2011  . Diabetes mellitus    NIDDM  . Hyperlipidemia   . Hypertension    under control; has been on med. x 4 yrs.  . Nonproliferative retinopathy due to secondary diabetes [facility])    Past Surgical History:  Procedure Laterality Date  . CARPAL TUNNEL RELEASE     bilat.  . DORSAL COMPARTMENT RELEASE  03/14/2011   Procedure: RELEASE DORSAL COMPARTMENT (DEQUERVAIN);  Surgeon: Paulene Floor, MD;  Location: [place];  Service: Orthopedics;  Laterality: Left;  left wrist APL and EPL tenosynovectomy and 1st dorsal compartment release   . TONSILLECTOMY AND ADENOIDECTOMY  as a child  . VASECTOMY     Current Outpatient Medications on File Prior to Visit  Medication Sig Dispense Refill  . albuterol (PROVENTIL HFA;VENTOLIN HFA) 108 (90 Base) MCG/ACT inhaler Inhale 2 puffs into the lungs every 4 (four) hours as needed for wheezing or shortness of breath. 1 Inhaler 0  . aspirin 81 MG tablet Take 81 mg by mouth daily.    [name] atorvastatin (LIPITOR) 40 MG tablet TAKE 1 TABLET BY MOUTH DAILY. 30 tablet 5  . bisoprolol-hydrochlorothiazide (ZIAC) 10-6.25 MG tablet TAKE 1 TABLET BY MOUTH EVERY DAY IN THE MORNING 90 tablet 3  . blood glucose meter kit and supplies KIT Dispense based on patient and insurance preference. Use twice daily times daily as directed. (FOR ICD-10  E11.65) 1 each 0  . Choline Fenofibrate (FENOFIBRIC ACID) 135 MG CPDR TAKE 1 CAPSULE BY MOUTH EVERY DAY IN THE EVENING 30 capsule 5  . diclofenac sodium (VOLTAREN) 1 % GEL Apply 2 g topically 4 (four) times daily. 100 g 1  . glipiZIDE (GLUCOTROL XL) 10 MG 24 hr tablet TAKE 1 TABLET (10 MG TOTAL) BY MOUTH DAILY. 30 tablet 5  . glucose blood test strip Dispense based on patient and insurance preference.  Use twice daily as directed. (FOR ICD-10 E11.65) 100 each 11  . JANUVIA 100 MG tablet TAKE 1 TABLET (100 MG  TOTAL) BY MOUTH DAILY. 30 tablet 5  . Lancets Thin MISC Check BS BID 100 each 5  . meloxicam (MOBIC) 15 MG tablet TAKE 1 TABLET BY MOUTH ONCE DAILY 90 tablet 1  . metFORMIN (GLUCOPHAGE-XR) 500 MG 24 hr tablet TAKE 4 TABLETS BY MOUTH ONCE DAILY WITH BREAKFAST 120 tablet 5  . TRUEPLUS LANCETS 33G MISC 1 each by Does not apply route 2 (two) times daily. 100 each 11  . TRULICITY 1.5 YS/0.6TK SOPN INJECT 1.5 MG SUBCUTANEOUSLY INTO THE SKIN ONCE A WEEK 6 pen 3   No current facility-administered medications on file prior to visit.   Allergies  Allergen Reactions  . Niaspan [Niacin] Rash   Social History   Socioeconomic History  . Marital status: Married    Spouse  name: Not on file  . Number of children: Not on file  . Years of education: Not on file  . Highest education level: Not on file  Occupational History  . Not on file  Tobacco Use  . Smoking status: Former Smoker    Packs/day: 3.00    Years: 6.00    Pack years: 18.00    Types: Cigarettes    Quit date: 02/05/1999    Years since quitting: 20.2  . Smokeless tobacco: Never Used  Substance and Sexual Activity  . Alcohol use: No    Alcohol/week: 0.0 standard drinks  . Drug use: No  . Sexual activity: Not on file  Other Topics Concern  . Not on file  Social History Narrative  . Not on file   Social Determinants of Health   Financial Resource Strain:   . Difficulty of Paying Living Expenses:   Food Insecurity:   . Worried About Charity fundraiser in the Last Year:   . Arboriculturist in the Last Year:   Transportation Needs:   . Film/video editor (Medical):   [name] Lack of Transportation (Non-Medical):   Physical Activity:   . Days of Exercise per Week:   . Minutes of Exercise per Session:   Stress:   . Feeling of Stress :   Social Connections:   . Frequency of Communication with Friends and Family:   . Frequency of Social Gatherings with Friends and Family:   . Attends Religious Services:   . Active Member of Clubs or Organizations:   . Attends Archivist Meetings:   [name] Marital Status:   Intimate Partner Violence:   . Fear of Current or Ex-Partner:   . Emotionally Abused:   [name] Physically Abused:   . Sexually Abused:       Review of Systems  All other systems reviewed and are negative.      Objective:   Physical Exam Vitals reviewed.  Cardiovascular:     Rate and Rhythm: Normal rate and regular rhythm.     Pulses: Normal pulses.     Heart sounds: Normal heart sounds.  Musculoskeletal:     Left upper arm: Tenderness present. No deformity.     Left elbow: Decreased range of motion. Tenderness present in medial epicondyle. No lateral epicondyle or  olecranon process tenderness.       Arms:           Assessment & Plan:  Ulnar nerve entrapment at elbow, left - Plan: NCV with EMG(electromyography)  Pain is out of proportion to exam suggesting neuropathic pain..  The patient jerks away and says ouch with gentle palpation of his medial epicondyle.  He also has  similar severe pain with palpation of the distal medial tricep.  I would not expect pain in this area for golfers elbow.  This raises concern about possible ulnar nerve entrapment.  The burning shooting pain around the medial epicondyle also raises concern about possible nerve entrapment.  Therefore I recommended that he use gabapentin 300 mg p.o. 3 times daily as needed nerve pain.  I will arrange the patient to receive nerve conduction studies of the upper extremity and if normal will refer the patient to orthopedics for medial epicondylitis.  Also offered the patient a cortisone injection for possible medial epicondylitis however he had such a severe reaction to his previous cortisone injection he politely declines this.

## 2019-04-23 ENCOUNTER — Other Ambulatory Visit: Payer: Self-pay | Admitting: Family Medicine

## 2019-04-23 DIAGNOSIS — G5622 Lesion of ulnar nerve, left upper limb: Secondary | ICD-10-CM

## 2019-05-19 ENCOUNTER — Encounter: Payer: BC Managed Care – PPO | Admitting: Neurology

## 2019-05-19 ENCOUNTER — Ambulatory Visit (INDEPENDENT_AMBULATORY_CARE_PROVIDER_SITE_OTHER): Payer: BC Managed Care – PPO | Admitting: Neurology

## 2019-05-19 DIAGNOSIS — Z0289 Encounter for other administrative examinations: Secondary | ICD-10-CM

## 2019-05-19 DIAGNOSIS — G5622 Lesion of ulnar nerve, left upper limb: Secondary | ICD-10-CM

## 2019-05-19 DIAGNOSIS — R200 Anesthesia of skin: Secondary | ICD-10-CM

## 2019-05-19 DIAGNOSIS — M25522 Pain in left elbow: Secondary | ICD-10-CM | POA: Insufficient documentation

## 2019-05-19 NOTE — Procedures (Signed)
Full Name: Matthew Osborne Gender: Male MRN #: [id number] Date of Birth: 06-17-1975    Visit Date: 05/19/2019 06:25 Age: 44 Years Examining Physician: Marcial Pacas, MD  Referring Physician: Jenna Luo, MD Height: 6 feet 2 inch History: 44 year old right-handed male with 4 months history of left elbow burning pain, difficulty using his left arm, he had a history of bilateral carpal tunnel release surgery in the past.  Summary of the tests: Nerve conduction study: Bilateral median sensory responses showed mild to moderately prolonged peak latency with moderately decreased snap amplitude.  Bilateral median motor responses showed mildly prolonged distal latency with normal CMAP amplitude, conduction velocity.  Left ulnar sensory responses showed normal peak latency, with moderately decreased snap amplitude, this likely related to his mild deformity of left finger.  Right ulnar sensory responses was normal.  Bilateral ulnar motor responses were normal.  Electromyography:  Selective needle examinations of left upper extremity muscles were normal.     Conclusion: This is an abnormal study.  There is electrodiagnostic evidence of moderate bilateral median neuropathy across the wrist, consistent with moderate bilateral carpal tunnel syndromes.  The moderately decreased left ulnar response could due to deformity of left fifth finger.  He complains of significant left medial elbow tenderness, most likely due to local left elbow musculoskeletal etiology.  I have suggested him to follow-up with orthopedic surgeon.    ------------------------------- Marcial Pacas, M.D. PhD  [facility] [place], [place] [address] Tel: [phone] Fax: [phone]         [facility]    Nerve / Sites Muscle Latency Ref. Amplitude Ref. Rel Amp Segments Distance Velocity Ref. Area    ms ms mV mV %  cm m/s m/s mVms  L Median - APB     Wrist APB 4.9 ?4.4 9.7 ?4.0 100 Wrist - APB 7    38.4     Upper arm APB 9.8  8.4  87.1 Upper arm - Wrist 25 51 ?49 33.2  R Median - APB     Wrist APB 4.6 ?4.4 8.6 ?4.0 100 Wrist - APB 7   33.7     Upper arm APB 9.5  8.2  95.5 Upper arm - Wrist 25 51 ?49 33.3  L Ulnar - ADM     Wrist ADM 2.6 ?3.3 11.0 ?6.0 100 Wrist - ADM 7   37.4     B.Elbow ADM 7.4  11.6  106 B.Elbow - Wrist 25 53 ?49 38.2     A.Elbow ADM 9.3  11.4  97.6 A.Elbow - B.Elbow 10 52 ?49 37.8         A.Elbow - Wrist      R Ulnar - ADM     Wrist ADM 2.7 ?3.3 11.8 ?6.0 100 Wrist - ADM 7   39.9     B.Elbow ADM 6.8  11.0  92.8 B.Elbow - Wrist 24 59 ?49 38.8     A.Elbow ADM 8.6  10.6  96.9 A.Elbow - B.Elbow 10 56 ?49 38.6         A.Elbow - Wrist                 SNC    Nerve / Sites Rec. Site Peak Lat Ref.  Amp Ref. Segments Distance    ms ms V V  cm  L Radial - Anatomical snuff box (Forearm)     Forearm Wrist 2.8 ?2.9 13 ?15 Forearm - Wrist 10  R Radial -  Anatomical snuff box (Forearm)     Forearm Wrist 2.3 ?2.9 16 ?15 Forearm - Wrist 10  L Median - Orthodromic (Dig II, Mid palm)     Dig II Wrist 5.0 ?3.4 4 ?10 Dig II - Wrist 13  R Median - Orthodromic (Dig II, Mid palm)     Dig II Wrist 4.5 ?3.4 5 ?10 Dig II - Wrist 13  L Ulnar - Orthodromic, (Dig V, Mid palm)     Dig V Wrist 2.9 ?3.1 2 ?5 Dig V - Wrist 11  R Ulnar - Orthodromic, (Dig V, Mid palm)     Dig V Wrist 2.8 ?3.1 8 ?5 Dig V - Wrist 67                 F  Wave    Nerve F Lat Ref.   ms ms  L Ulnar - ADM 35.6 ?32.0  R Ulnar - ADM 33.5 ?32.0         EMG Summary Table    Spontaneous MUAP Recruitment  Muscle IA Fib PSW Fasc Other Amp Dur. Poly Pattern  L. First dorsal interosseous Normal None None None _______ Normal Normal Normal Normal  L. Pronator teres Normal None None None _______ Normal Normal Normal Normal  L. Biceps brachii Normal None None None _______ Normal Normal Normal Normal  L. Deltoid Normal None None None _______ Normal Normal Normal Normal  L. Brachioradialis Normal None None None _______  Normal Normal Normal Normal  L. Flexor carpi ulnaris Normal None None None _______ Normal Normal Normal Normal

## 2019-05-21 ENCOUNTER — Other Ambulatory Visit: Payer: Self-pay | Admitting: *Deleted

## 2019-05-21 ENCOUNTER — Encounter: Payer: Self-pay | Admitting: Family Medicine

## 2019-05-21 DIAGNOSIS — M25522 Pain in left elbow: Secondary | ICD-10-CM

## 2019-05-21 DIAGNOSIS — G5622 Lesion of ulnar nerve, left upper limb: Secondary | ICD-10-CM

## 2019-06-15 ENCOUNTER — Encounter: Payer: Self-pay | Admitting: Family Medicine

## 2019-06-28 MED FILL — traMADol HCL 50 MG TABS: 50 | 6 days supply | Qty: 42 | Fill #0

## 2019-08-16 ENCOUNTER — Ambulatory Visit: Payer: BC Managed Care – PPO | Admitting: Family Medicine

## 2019-08-20 ENCOUNTER — Ambulatory Visit (INDEPENDENT_AMBULATORY_CARE_PROVIDER_SITE_OTHER): Payer: BC Managed Care – PPO | Admitting: Family Medicine

## 2019-08-20 ENCOUNTER — Other Ambulatory Visit: Payer: Self-pay

## 2019-08-20 VITALS — BP 116/80 | HR 73 | Temp 97.5°F | Ht 73.0 in | Wt 237.0 lb

## 2019-08-20 DIAGNOSIS — R0609 Other forms of dyspnea: Secondary | ICD-10-CM

## 2019-08-20 DIAGNOSIS — I10 Essential (primary) hypertension: Secondary | ICD-10-CM

## 2019-08-20 DIAGNOSIS — R0600 Dyspnea, unspecified: Secondary | ICD-10-CM | POA: Diagnosis not present

## 2019-08-20 DIAGNOSIS — E1140 Type 2 diabetes mellitus with diabetic neuropathy, unspecified: Secondary | ICD-10-CM

## 2019-08-20 DIAGNOSIS — Z0000 Encounter for general adult medical examination without abnormal findings: Secondary | ICD-10-CM

## 2019-08-20 DIAGNOSIS — E782 Mixed hyperlipidemia: Secondary | ICD-10-CM

## 2019-08-20 DIAGNOSIS — Z0001 Encounter for general adult medical examination with abnormal findings: Secondary | ICD-10-CM

## 2019-08-20 NOTE — Progress Notes (Signed)
Subjective:    Patient ID: Matthew Osborne, male    DOB: [birth year], 44 y.o.   MRN: [id number]  Patient is a 44 year old diabetic male here today for complete physical exam.  He has a history to poorly controlled diabetes mellitus type 2, hyperlipidemia/dyslipidemia, and hypertension.  He has a history of missing his medication often.  He is here today for his physical exam however he also mentions that he has been getting more short of breath recently.  He denies chest pain.  However he states that if he does any type of physical exertion or strenuous activity he will become so short winded that he will start coughing.  He has to rest to catch his breath.  He has no history of asthma.  He denies any wheezing however he does start coughing.  However his biggest symptom is easy fatigability and dyspnea with exertion.  He denies angina.  He denies any arm pain.  He denies any nausea or vomiting or lightheadedness.  Blood pressure today is well controlled at 116/80. Past Medical History:  Diagnosis Date  . De Quervain's tenosynovitis, left 03/2011  . Diabetes mellitus    NIDDM  . Hyperlipidemia   . Hypertension    under control; has been on med. x 4 yrs.  . Nonproliferative retinopathy due to secondary diabetes [facility])    Past Surgical History:  Procedure Laterality Date  . CARPAL TUNNEL RELEASE     bilat.  . DORSAL COMPARTMENT RELEASE  03/14/2011   Procedure: RELEASE DORSAL COMPARTMENT (DEQUERVAIN);  Surgeon: Paulene Floor, MD;  Location: [place];  Service: Orthopedics;  Laterality: Left;  left wrist APL and EPL tenosynovectomy and 1st dorsal compartment release  . TONSILLECTOMY AND ADENOIDECTOMY  as a child  . VASECTOMY     Current Outpatient Medications on File Prior to Visit  Medication Sig Dispense Refill  . albuterol (PROVENTIL HFA;VENTOLIN HFA) 108 (90 Base) MCG/ACT inhaler Inhale 2 puffs into the lungs every 4 (four) hours as needed for wheezing or shortness of  breath. 1 Inhaler 0  . aspirin 81 MG tablet Take 81 mg by mouth daily.    [name] atorvastatin (LIPITOR) 40 MG tablet TAKE 1 TABLET BY MOUTH DAILY. 30 tablet 5  . bisoprolol-hydrochlorothiazide (ZIAC) 10-6.25 MG tablet TAKE 1 TABLET BY MOUTH EVERY DAY IN THE MORNING 90 tablet 3  . blood glucose meter kit and supplies KIT Dispense based on patient and insurance preference. Use twice daily times daily as directed. (FOR ICD-10  E11.65) 1 each 0  . Choline Fenofibrate (FENOFIBRIC ACID) 135 MG CPDR TAKE 1 CAPSULE BY MOUTH EVERY DAY IN THE EVENING 30 capsule 5  . diclofenac sodium (VOLTAREN) 1 % GEL Apply 2 g topically 4 (four) times daily. 100 g 1  . gabapentin (NEURONTIN) 300 MG capsule Take 1 capsule (300 mg total) by mouth 3 (three) times daily. 90 capsule 3  . glipiZIDE (GLUCOTROL XL) 10 MG 24 hr tablet TAKE 1 TABLET (10 MG TOTAL) BY MOUTH DAILY. 30 tablet 5  . glucose blood test strip Dispense based on patient and insurance preference.  Use twice daily as directed. (FOR ICD-10 E11.65) 100 each 11  . JANUVIA 100 MG tablet TAKE 1 TABLET (100 MG TOTAL) BY MOUTH DAILY. 30 tablet 5  . Lancets Thin MISC Check BS BID 100 each 5  . meloxicam (MOBIC) 15 MG tablet TAKE 1 TABLET BY MOUTH ONCE DAILY 90 tablet 1  . metFORMIN (GLUCOPHAGE-XR) 500 MG 24  hr tablet TAKE 4 TABLETS BY MOUTH ONCE DAILY WITH BREAKFAST 120 tablet 5  . TRUEPLUS LANCETS 33G MISC 1 each by Does not apply route 2 (two) times daily. 100 each 11  . TRULICITY 1.5 PY/0.9XI SOPN INJECT 1.5 MG SUBCUTANEOUSLY INTO THE SKIN ONCE A WEEK 6 pen 3   No current facility-administered medications on file prior to visit.   Allergies  Allergen Reactions  . Niaspan [Niacin] Rash   Social History   Socioeconomic History  . Marital status: Married    Spouse name: Not on file  . Number of children: Not on file  . Years of education: Not on file  . Highest education level: Not on file  Occupational History  . Not on file  Tobacco Use  . Smoking status:  Former Smoker    Packs/day: 3.00    Years: 6.00    Pack years: 18.00    Types: Cigarettes    Quit date: 02/05/1999    Years since quitting: 20.5  . Smokeless tobacco: Never Used  Substance and Sexual Activity  . Alcohol use: No    Alcohol/week: 0.0 standard drinks  . Drug use: No  . Sexual activity: Not on file  Other Topics Concern  . Not on file  Social History Narrative  . Not on file   Social Determinants of Health   Financial Resource Strain:   . Difficulty of Paying Living Expenses:   Food Insecurity:   . Worried About Charity fundraiser in the Last Year:   . Arboriculturist in the Last Year:   Transportation Needs:   . Film/video editor (Medical):   [name] Lack of Transportation (Non-Medical):   Physical Activity:   . Days of Exercise per Week:   . Minutes of Exercise per Session:   Stress:   . Feeling of Stress :   Social Connections:   . Frequency of Communication with Friends and Family:   . Frequency of Social Gatherings with Friends and Family:   . Attends Religious Services:   . Active Member of Clubs or Organizations:   . Attends Archivist Meetings:   [name] Marital Status:   Intimate Partner Violence:   . Fear of Current or Ex-Partner:   . Emotionally Abused:   [name] Physically Abused:   . Sexually Abused:       Review of Systems  All other systems reviewed and are negative.      Objective:   Physical Exam Vitals reviewed.  Constitutional:      General: He is not in acute distress.    Appearance: Normal appearance. He is not ill-appearing, toxic-appearing or diaphoretic.  HENT:     Right Ear: Tympanic membrane and ear canal normal.     Left Ear: Tympanic membrane and ear canal normal.     Nose: Nose normal. No congestion or rhinorrhea.     Mouth/Throat:     Mouth: Mucous membranes are moist.     Pharynx: Oropharynx is clear. No oropharyngeal exudate or posterior oropharyngeal erythema.  Eyes:     General:        Right eye: No  discharge.        Left eye: No discharge.     Conjunctiva/sclera: Conjunctivae normal.  Cardiovascular:     Rate and Rhythm: Normal rate and regular rhythm.     Pulses: Normal pulses.     Heart sounds: Normal heart sounds. No murmur heard.   Pulmonary:  Effort: Pulmonary effort is normal. No respiratory distress.     Breath sounds: Normal breath sounds. No stridor. No wheezing, rhonchi or rales.  Abdominal:     General: Bowel sounds are normal. There is no distension.     Palpations: Abdomen is soft.     Tenderness: There is no abdominal tenderness. There is no guarding or rebound.  Musculoskeletal:     Right lower leg: No edema.     Left lower leg: No edema.  Lymphadenopathy:     Cervical: No cervical adenopathy.  Skin:    Coloration: Skin is not jaundiced or pale.     Findings: No bruising, erythema, lesion or rash.  Neurological:     General: No focal deficit present.     Mental Status: He is alert and oriented to person, place, and time. Mental status is at baseline.     Cranial Nerves: No cranial nerve deficit.     Sensory: No sensory deficit.     Motor: No weakness.     Coordination: Coordination normal.     Gait: Gait normal.     Deep Tendon Reflexes: Reflexes normal.  Psychiatric:        Mood and Affect: Mood normal.        Behavior: Behavior normal.        Thought Content: Thought content normal.        Judgment: Judgment normal.           Assessment & Plan:  Type 2 diabetes mellitus with diabetic neuropathy, without long-term current use of insulin (HCC) - Plan: CBC with Differential/Platelet, COMPLETE METABOLIC PANEL WITH GFR, Lipid panel, Microalbumin, urine, Hemoglobin A1c  Essential hypertension, benign  Dyspnea on exertion - Plan: EKG 12-Lead  Mixed hyperlipidemia  CPE  I am concerned by his review of systems.  He does report dyspnea on exertion.  This is been a sudden change from his baseline.  Differential diagnosis could be deconditioning.   Exercise-induced asthma would also be a possibility.  It could simply be the heat and humidity.  However given his age and his complicated medical risk factors, I have recommended a cardiology consultation for stress test.  EKG today shows normal sinus rhythm with normal intervals and a normal axis with no evidence of ischemia or infarction.  Blood pressure today is well controlled.  I will check CBC, CMP, fasting lipid panel, urine microalbumin, and hemoglobin A1c.  Goal hemoglobin A1c is less than 6.5.  Patient is already taking an aspirin along with a beta-blocker.   Await the results of the stress test.  Recommended diabetic eye exam.  Recommend Pneumovax 23.  Recommend Covid vaccination however I feel that we need to get the cardiology consultation first

## 2019-08-21 LAB — COMPLETE METABOLIC PANEL WITH GFR
AG Ratio: 1.4 (calc) (ref 1.0–2.5)
ALT: 25 U/L (ref 9–46)
AST: 28 U/L (ref 10–40)
Albumin: 4.4 g/dL (ref 3.6–5.1)
Alkaline phosphatase (APISO): 108 U/L (ref 36–130)
BUN: 12 mg/dL (ref 7–25)
CO2: 27 mmol/L (ref 20–32)
Calcium: 9.3 mg/dL (ref 8.6–10.3)
Chloride: 101 mmol/L (ref 98–110)
Creat: 1.01 mg/dL (ref 0.60–1.35)
GFR, Est African American: 104 mL/min/{1.73_m2} (ref >=60)
GFR, Est Non African American: 90 mL/min/{1.73_m2} (ref >=60)
Globulin: 3.1 g/dL (calc) (ref 1.9–3.7)
Glucose, Bld: 182 mg/dL — ABNORMAL HIGH (ref 65–99)
Potassium: 4.6 mmol/L (ref 3.5–5.3)
Sodium: 138 mmol/L (ref 135–146)
Total Bilirubin: 0.7 mg/dL (ref 0.2–1.2)
Total Protein: 7.5 g/dL (ref 6.1–8.1)

## 2019-08-21 LAB — CBC WITH DIFFERENTIAL/PLATELET
Absolute Monocytes: 524 cells/uL (ref 200–950)
Basophils Absolute: 48 cells/uL (ref 0–200)
Basophils Relative: 0.7 %
Eosinophils Absolute: 131 cells/uL (ref 15–500)
Eosinophils Relative: 1.9 %
HCT: 48.4 % (ref 38.5–50.0)
Hemoglobin: 16.2 g/dL (ref 13.2–17.1)
Lymphs Abs: 1739 cells/uL (ref 850–3900)
MCH: 30.3 pg (ref 27.0–33.0)
MCHC: 33.5 g/dL (ref 32.0–36.0)
MCV: 90.5 fL (ref 80.0–100.0)
MPV: 9.4 fL (ref 7.5–12.5)
Monocytes Relative: 7.6 %
Neutro Abs: 4457 cells/uL (ref 1500–7800)
Neutrophils Relative %: 64.6 %
Platelets: 304 10*3/uL (ref 140–400)
RBC: 5.35 10*6/uL (ref 4.20–5.80)
RDW: 12 % (ref 11.0–15.0)
Total Lymphocyte: 25.2 %
WBC: 6.9 10*3/uL (ref 3.8–10.8)

## 2019-08-21 LAB — LIPID PANEL
Cholesterol: 145 mg/dL (ref <200)
HDL: 27 mg/dL — ABNORMAL LOW (ref >=40)
LDL Cholesterol (Calc): 98 mg/dL (calc)
Non-HDL Cholesterol (Calc): 118 mg/dL (calc) (ref <130)
Total CHOL/HDL Ratio: 5.4 (calc) — ABNORMAL HIGH (ref <5.0)
Triglycerides: 104 mg/dL (ref <150)

## 2019-08-21 LAB — MICROALBUMIN, URINE: Microalb, Ur: 1.7 mg/dL

## 2019-08-21 LAB — HEMOGLOBIN A1C
Hgb A1c MFr Bld: 8.9 % of total Hgb — ABNORMAL HIGH (ref <5.7)
Mean Plasma Glucose: 209 (calc)
eAG (mmol/L): 11.6 (calc)

## 2019-08-23 ENCOUNTER — Encounter (INDEPENDENT_AMBULATORY_CARE_PROVIDER_SITE_OTHER): Payer: Self-pay | Admitting: Emergency Medicine

## 2019-08-23 ENCOUNTER — Telehealth: Payer: Self-pay | Admitting: Emergency Medicine

## 2019-08-23 ENCOUNTER — Other Ambulatory Visit: Payer: Self-pay

## 2019-08-23 DIAGNOSIS — T63481A Toxic effect of venom of other arthropod, accidental (unintentional), initial encounter: Secondary | ICD-10-CM

## 2019-08-23 DIAGNOSIS — E1140 Type 2 diabetes mellitus with diabetic neuropathy, unspecified: Secondary | ICD-10-CM

## 2019-08-23 MED ORDER — EMPAGLIFLOZIN 25 MG PO TABS: 25.0000 mg | ORAL_TABLET | Freq: Every day | ORAL | 3 refills | Status: DC

## 2019-08-23 NOTE — Progress Notes (Signed)
E Visit for Insect Sting  Thank you for describing the insect sting for [place].  Here is how we plan to help!  A sting that we will treat with a short course of prednisone.  The 2 greatest risks from insect stings are allergic reaction, which can be fatal in some people and infection, which is more common and less serious.  Bees, wasps, yellow jackets, and hornets belong to a class of insects called Hymenoptera.  Most insect stings cause only minor discomfort.  Stings can happen anywhere on the body and can be painful.  Most stings are from honey bees or yellow jackets.  Fire ants can sting multiple times.  The sites of the stings are more likely to become infected.    I have ordered medrol dosepack use as directed.   What can be used to prevent Insect Stings?   Insect repellant with at least 20% DEET.    Wearing long pants and shirts with socks and shoes.    Wear dark or drab-colored clothes rather than bright colors.    Avoid using perfumes and hair sprays; these attract insects.  HOME CARE ADVICE:  1. Stinger removal:  The stinger looks like a tiny black dot in the sting.  Use a fingernail, credit card edge, or knife-edge to scrape it off.  Don't pull it out because it squeezes out more venom.  If the stinger is below the skin surface, leave it alone.  It will be shed with normal skin healing. 2. Use cold compresses to the area of the sting for 10-20 minutes.  You may repeat this as needed to relieve symptoms of pain and swelling. 3.  For pain relief, take acetominophen 650 mg 4-6 hours as needed or ibuprofen 400 mg every 6-8 hours as needed or naproxen 250-500 mg every 12 hours as needed. 4.  You can also use hydrocortisone cream 0.5% or 1% up to 4 times daily as needed for itching. 5.  If the sting becomes very itchy, take Benadryl 25-50 mg, follow directions on box. 6.  Wash the area 2-3 times daily with antibacterial soap and warm water. 7. Call your Doctor  if:  Fever, a severe headache, or rash occur in the next 2 weeks.  Sting area begins to look infected.  Redness and swelling worsens after home treatment.  Your current symptoms become worse.    MAKE SURE YOU:   Understand these instructions.  Will watch your condition.  Will get help right away if you are not doing well or get worse.  Thank you for choosing an e-visit. Your e-visit answers were reviewed by a board certified advanced clinical practitioner to complete your personal care plan. Depending upon the condition, your plan could have included both over the counter or prescription medications. Please review your pharmacy choice. Be sure that the pharmacy you have chosen is open so that you can pick up your prescription now.  If there is a problem you may message your provider in [place] to have the prescription routed to another pharmacy. Your safety is important to [place]. If you have drug allergies check your prescription carefully.  For the next 24 hours, you can use MyChart to ask questions about today's visit, request a non-urgent call back, or ask for a work or school excuse from your e-visit provider. You will get an email in the next two days asking about your experience. I hope that your e-visit has been valuable and will speed your recovery.   **  Please do not respond to this message unless you have follow up questions.** Greater than 5 but less than 10 minutes spent researching, coordinating, and implementing care for this patient today

## 2019-08-24 ENCOUNTER — Ambulatory Visit
Admission: EM | Admit: 2019-08-24 | Discharge: 2019-08-24 | Disposition: A | Discharge status: Home / Self Care | Payer: BC Managed Care – PPO | Attending: Emergency Medicine | Admitting: Emergency Medicine

## 2019-08-24 ENCOUNTER — Ambulatory Visit: Payer: Self-pay

## 2019-08-24 ENCOUNTER — Other Ambulatory Visit: Payer: Self-pay

## 2019-08-24 ENCOUNTER — Encounter: Payer: Self-pay | Admitting: Emergency Medicine

## 2019-08-24 DIAGNOSIS — R21 Rash and other nonspecific skin eruption: Secondary | ICD-10-CM | POA: Diagnosis not present

## 2019-08-24 DIAGNOSIS — T7840XA Allergy, unspecified, initial encounter: Secondary | ICD-10-CM

## 2019-08-24 DIAGNOSIS — L299 Pruritus, unspecified: Secondary | ICD-10-CM

## 2019-08-24 DIAGNOSIS — L509 Urticaria, unspecified: Secondary | ICD-10-CM

## 2019-08-24 MED ORDER — HYDROXYZINE HCL 25 MG PO TABS
25.0000 mg | ORAL_TABLET | Freq: Four times a day (QID) | ORAL | 0 refills | Status: DC | PRN
Start: 2019-08-24 — End: 2019-12-09

## 2019-08-24 MED ORDER — METHYLPREDNISOLONE SODIUM SUCC 125 MG IJ SOLR
80.0000 mg | Freq: Once | INTRAMUSCULAR | Status: AC
  Administered 2019-08-24: 80 mg via INTRAMUSCULAR

## 2019-08-24 MED ORDER — PREDNISONE 20 MG PO TABS
20.0000 mg | ORAL_TABLET | Freq: Every day | ORAL | 0 refills | Status: DC
Start: 2019-08-24 — End: 2019-08-25

## 2019-08-24 NOTE — ED Provider Notes (Signed)
[place]   [id number] 08/24/19 Arrival Time: 1904  CC: Rash   SUBJECTIVE:  Matthew Osborne is a 44 y.o. male who presents with a rash diffuse about the body x 5 days.  Speculates possible yellow jacket sting to LLE.  Describes it as red, itching, and spreading, painful in some areas.  Has tried triamcinolone cream, calamine lotion and benadryl without relief.  Symptoms are made worse with scratching.  Denies similar symptoms in the past.   Denies fever, chills, nausea, vomiting, swelling, discharge, oral manifestations: throat/ mouth/ tongue swelling/ tingling/ tightness, SOB, chest pain, abdominal pain, changes in bowel or bladder function.    ROS: As per HPI.  All other pertinent ROS negative.     Past Medical History:  Diagnosis Date  . De Quervain's tenosynovitis, left 03/2011  . Diabetes mellitus    NIDDM  . Hyperlipidemia   . Hypertension    under control; has been on med. x 4 yrs.  . Nonproliferative retinopathy due to secondary diabetes [facility])    Past Surgical History:  Procedure Laterality Date  . CARPAL TUNNEL RELEASE     bilat.  . DORSAL COMPARTMENT RELEASE  03/14/2011   Procedure: RELEASE DORSAL COMPARTMENT (DEQUERVAIN);  Surgeon: Paulene Floor, MD;  Location: [place];  Service: Orthopedics;  Laterality: Left;  left wrist APL and EPL tenosynovectomy and 1st dorsal compartment release  . TONSILLECTOMY AND ADENOIDECTOMY  as a child  . VASECTOMY     Allergies  Allergen Reactions  . Niaspan [Niacin] Rash   No current facility-administered medications on file prior to encounter.   Current Outpatient Medications on File Prior to Encounter  Medication Sig Dispense Refill  . albuterol (PROVENTIL HFA;VENTOLIN HFA) 108 (90 Base) MCG/ACT inhaler Inhale 2 puffs into the lungs every 4 (four) hours as needed for wheezing or shortness of breath. 1 Inhaler 0  . aspirin 81 MG tablet Take 81 mg by mouth daily.    [name] atorvastatin (LIPITOR) 40 MG  tablet TAKE 1 TABLET BY MOUTH DAILY. 30 tablet 5  . bisoprolol-hydrochlorothiazide (ZIAC) 10-6.25 MG tablet TAKE 1 TABLET BY MOUTH EVERY DAY IN THE MORNING 90 tablet 3  . blood glucose meter kit and supplies KIT Dispense based on patient and insurance preference. Use twice daily times daily as directed. (FOR ICD-10  E11.65) 1 each 0  . Choline Fenofibrate (FENOFIBRIC ACID) 135 MG CPDR TAKE 1 CAPSULE BY MOUTH EVERY DAY IN THE EVENING 30 capsule 5  . diclofenac sodium (VOLTAREN) 1 % GEL Apply 2 g topically 4 (four) times daily. 100 g 1  . empagliflozin (JARDIANCE) 25 MG TABS tablet Take 1 tablet (25 mg total) by mouth daily before breakfast. 30 tablet 3  . gabapentin (NEURONTIN) 300 MG capsule Take 1 capsule (300 mg total) by mouth 3 (three) times daily. 90 capsule 3  . glipiZIDE (GLUCOTROL XL) 10 MG 24 hr tablet TAKE 1 TABLET (10 MG TOTAL) BY MOUTH DAILY. 30 tablet 5  . glucose blood test strip Dispense based on patient and insurance preference.  Use twice daily as directed. (FOR ICD-10 E11.65) 100 each 11  . JANUVIA 100 MG tablet TAKE 1 TABLET (100 MG TOTAL) BY MOUTH DAILY. 30 tablet 5  . Lancets Thin MISC Check BS BID 100 each 5  . meloxicam (MOBIC) 15 MG tablet TAKE 1 TABLET BY MOUTH ONCE DAILY 90 tablet 1  . metFORMIN (GLUCOPHAGE-XR) 500 MG 24 hr tablet TAKE 4 TABLETS BY MOUTH ONCE DAILY WITH  BREAKFAST 120 tablet 5  . TRUEPLUS LANCETS 33G MISC 1 each by Does not apply route 2 (two) times daily. 100 each 11  . TRULICITY 1.5 JF/3.5KT SOPN INJECT 1.5 MG SUBCUTANEOUSLY INTO THE SKIN ONCE A WEEK 6 pen 3   Social History   Socioeconomic History  . Marital status: Married    Spouse name: Not on file  . Number of children: Not on file  . Years of education: Not on file  . Highest education level: Not on file  Occupational History  . Not on file  Tobacco Use  . Smoking status: Former Smoker    Packs/day: 3.00    Years: 6.00    Pack years: 18.00    Types: Cigarettes    Quit date: 02/05/1999     Years since quitting: 20.5  . Smokeless tobacco: Never Used  Substance and Sexual Activity  . Alcohol use: No    Alcohol/week: 0.0 standard drinks  . Drug use: No  . Sexual activity: Not on file  Other Topics Concern  . Not on file  Social History Narrative  . Not on file   Social Determinants of Health   Financial Resource Strain:   . Difficulty of Paying Living Expenses:   Food Insecurity:   . Worried About Charity fundraiser in the Last Year:   . Arboriculturist in the Last Year:   Transportation Needs:   . Film/video editor (Medical):   [name] Lack of Transportation (Non-Medical):   Physical Activity:   . Days of Exercise per Week:   . Minutes of Exercise per Session:   Stress:   . Feeling of Stress :   Social Connections:   . Frequency of Communication with Friends and Family:   . Frequency of Social Gatherings with Friends and Family:   . Attends Religious Services:   . Active Member of Clubs or Organizations:   . Attends Archivist Meetings:   [name] Marital Status:   Intimate Partner Violence:   . Fear of Current or Ex-Partner:   . Emotionally Abused:   [name] Physically Abused:   . Sexually Abused:    Family History  Problem Relation Age of Onset  . Cancer Other   . Hyperlipidemia Other   . Hypertension Brother   . Asthma Son   . Lung cancer Maternal Grandfather   . Rheum arthritis Maternal Grandmother     OBJECTIVE: Vitals:   08/24/19 1921  BP: (!) 177/104  Pulse: 75  Resp: 18  Temp: 98.2 F (36.8 C)  TempSrc: Oral  SpO2: 97%  Weight: 230 lb (104.3 kg)    General appearance: alert; no distress Head: NCAT Lungs: normal respiratory effort Extremities: no edema Skin: warm and dry; erythematous papular rash diffuse about the body including abdomen, bilateral LEs, and LUE; NTTP, no obvious drainage or bleeding Psychological: alert and cooperative; normal mood and affect  ASSESSMENT & PLAN:  1. Rash and nonspecific skin eruption   2.  Allergic reaction, initial encounter   3. Hives     Meds ordered this encounter  Medications  . methylPREDNISolone sodium succinate (SOLU-MEDROL) 125 mg/2 mL injection 80 mg  . predniSONE (DELTASONE) 20 MG tablet    Sig: Take 1 tablet (20 mg total) by mouth daily with breakfast for 5 days.    Dispense:  5 tablet    Refill:  0    Order Specific Question:   Supervising Provider    Answer:   Blanchie Serve  SUE [[id number]]  . hydrOXYzine (ATARAX/VISTARIL) 25 MG tablet    Sig: Take 1 tablet (25 mg total) by mouth every 6 (six) hours as needed for itching.    Dispense:  30 tablet    Refill:  0    Order Specific Question:   Supervising Provider    Answer:   Raylene Everts [[id number]]   Steroid shot given in office Prednisone prescribed.  Take as directed.   Hydroxyzine prescribed.  Use as needed for itching.  Do not take prior to driving or operating heavy machinery as this may cause drowsiness Follow up with PCP if symptoms persists Return or go to the ER if you have any new or worsening symptoms such as fever, chills, nausea, vomiting, redness, swelling, discharge, if symptoms do not improve with medications, etc...  Reviewed expectations re: course of current medical issues. Questions answered. Outlined signs and symptoms indicating need for more acute intervention. Patient verbalized understanding. After Visit Summary given.   Lestine Box, PA-C 08/24/19 1947

## 2019-08-24 NOTE — Discharge Instructions (Addendum)
Steroid shot given in office Prednisone prescribed.  Take as directed.   Hydroxyzine prescribed.  Use as needed for itching.  Do not take prior to driving or operating heavy machinery as this may cause drowsiness Follow up with PCP if symptoms persists Return or go to the ER if you have any new or worsening symptoms such as fever, chills, nausea, vomiting, redness, swelling, discharge, if symptoms do not improve with medications, etc..[name]

## 2019-08-24 NOTE — ED Triage Notes (Signed)
Last Thursday, weed eating, got in yellow jackets nest.  Rash all over body. Worse on legs.  Did e-visit few days ago and was given triamcinolone cream.  Using cortisone cream.  Using calamine lotion.  Using benadryl and pepcid with no relief.  No fevers.

## 2019-08-25 ENCOUNTER — Other Ambulatory Visit: Payer: Self-pay

## 2019-08-25 ENCOUNTER — Other Ambulatory Visit: Payer: Self-pay | Admitting: Nurse Practitioner

## 2019-08-25 ENCOUNTER — Ambulatory Visit: Payer: BC Managed Care – PPO | Admitting: Nurse Practitioner

## 2019-08-25 VITALS — BP 144/86 | HR 90 | Temp 98.0°F | Resp 18 | Wt 239.2 lb

## 2019-08-25 DIAGNOSIS — E1165 Type 2 diabetes mellitus with hyperglycemia: Secondary | ICD-10-CM

## 2019-08-25 DIAGNOSIS — T63441D Toxic effect of venom of bees, accidental (unintentional), subsequent encounter: Secondary | ICD-10-CM | POA: Diagnosis not present

## 2019-08-25 MED ORDER — INSULIN REGULAR HUMAN 100 UNIT/ML IJ SOLN: INTRAMUSCULAR | 11 refills | Status: DC

## 2019-08-25 MED ORDER — PREDNISONE 10 MG PO TABS: ORAL_TABLET | ORAL | 0 refills | Status: DC

## 2019-08-25 MED ORDER — BLOOD GLUCOSE MONITOR KIT: PACK | 0 refills | Status: AC

## 2019-08-25 MED FILL — NovoLIN R 100 UNIT/ML SOLN: 100 | 28 days supply | Qty: 10 | Fill #0 | Status: TO

## 2019-08-25 MED FILL — NovoLIN R 100 UNIT/ML SOLN: 100 | 28 days supply | Qty: 10 | Fill #0

## 2019-08-25 NOTE — Progress Notes (Signed)
Established Patient Office Visit  Subjective:  Patient ID: Matthew Osborne, male    DOB: 11-27-1975  Age: 44 y.o. MRN: [id number]  CC:  Chief Complaint  Patient presents with  . Insect Bite    yellowjackets, many stings on whole body,, happened on 07/15, was given cortisone at [facility], benedryl was taken    HPI Matthew Osborne is a 44 y.o. male who presents with a rash diffuse about the body x 6 days. The pt reports that he saw one yellow jacket after feeling intense pain on his back while mowing his grass then looked down and saw many. He now has many red circular raised areas on BLE, and back. He was seen in the urgent care yesterday and was given in that clinic 80 mg steroid IM and prescribed anti-itch oral medication and prednisone 28m to take daily. He has not started the prednisone. 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    Denies fever, chills, nausea, vomiting, swelling, discharge, oral manifestations: throat/ mouth/ tongue swelling/ tingling/ tightness, SOB, chest pain, abdominal pain, changes in bowel or bladder function.     Past Medical History:  Diagnosis Date  . De Quervain's tenosynovitis, left 03/2011  . Diabetes mellitus    NIDDM  . Hyperlipidemia   . Hypertension    under control; has been on med. x 4 yrs.  . Nonproliferative retinopathy due to secondary diabetes ([facility]     Past Surgical History:  Procedure Laterality Date  . CARPAL TUNNEL RELEASE     bilat.  . DORSAL COMPARTMENT RELEASE  03/14/2011   Procedure: RELEASE DORSAL COMPARTMENT (DEQUERVAIN);  Surgeon: WPaulene Floor MD;  Location[place] [facility]  Service: Orthopedics;  Laterality: Left;  left wrist APL and EPL tenosynovectomy and 1st dorsal compartment release  . TONSILLECTOMY AND ADENOIDECTOMY  as a child  . VASECTOMY      Family History  Problem Relation  Age of Onset  . Cancer Other   . Hyperlipidemia Other   . Hypertension Brother   . Asthma Son   . Lung cancer Maternal Grandfather   . Rheum arthritis Maternal Grandmother     Social History   Socioeconomic History  . Marital status: Married    Spouse name: Not on file  . Number of children: Not on file  . Years of education: Not on file  . Highest education level: Not on file  Occupational History  . Not on file  Tobacco Use  . Smoking status: Former Smoker    Packs/day: 3.00    Years: 6.00    Pack years: 18.00    Types: Cigarettes    Quit date: 02/05/1999    Years since quitting: 20.5  . Smokeless tobacco: Never Used  Substance and Sexual Activity  . Alcohol use: No    Alcohol/week: 0.0 standard drinks  . Drug use: No  . Sexual activity: Not on file  Other Topics Concern  . Not on file  Social History Narrative  . Not on file   Social Determinants of Health   Financial Resource Strain:   . Difficulty of Paying Living Expenses:   Food Insecurity:   . Worried About RCharity fundraiserin the Last Year:   . RArboriculturistin the Last Year:   Transportation Needs:   .  Lack of Transportation (Medical):   [name] Lack of Transportation (Non-Medical):   Physical Activity:   . Days of Exercise per Week:   . Minutes of Exercise per Session:   Stress:   . Feeling of Stress :   Social Connections:   . Frequency of Communication with Friends and Family:   . Frequency of Social Gatherings with Friends and Family:   . Attends Religious Services:   . Active Member of Clubs or Organizations:   . Attends Archivist Meetings:   [name] Marital Status:   Intimate Partner Violence:   . Fear of Current or Ex-Partner:   . Emotionally Abused:   [name] Physically Abused:   . Sexually Abused:     Outpatient Medications Prior to Visit  Medication Sig Dispense Refill  . albuterol (PROVENTIL HFA;VENTOLIN HFA) 108 (90 Base) MCG/ACT inhaler Inhale 2 puffs into the lungs every 4  (four) hours as needed for wheezing or shortness of breath. 1 Inhaler 0  . aspirin 81 MG tablet Take 81 mg by mouth daily.    [name] atorvastatin (LIPITOR) 40 MG tablet TAKE 1 TABLET BY MOUTH DAILY. 30 tablet 5  . bisoprolol-hydrochlorothiazide (ZIAC) 10-6.25 MG tablet TAKE 1 TABLET BY MOUTH EVERY DAY IN THE MORNING 90 tablet 3  . Choline Fenofibrate (FENOFIBRIC ACID) 135 MG CPDR TAKE 1 CAPSULE BY MOUTH EVERY DAY IN THE EVENING 30 capsule 5  . diclofenac sodium (VOLTAREN) 1 % GEL Apply 2 g topically 4 (four) times daily. 100 g 1  . empagliflozin (JARDIANCE) 25 MG TABS tablet Take 1 tablet (25 mg total) by mouth daily before breakfast. 30 tablet 3  . gabapentin (NEURONTIN) 300 MG capsule Take 1 capsule (300 mg total) by mouth 3 (three) times daily. 90 capsule 3  . glipiZIDE (GLUCOTROL XL) 10 MG 24 hr tablet TAKE 1 TABLET (10 MG TOTAL) BY MOUTH DAILY. 30 tablet 5  . glucose blood test strip Dispense based on patient and insurance preference.  Use twice daily as directed. (FOR ICD-10 E11.65) 100 each 11  . hydrOXYzine (ATARAX/VISTARIL) 25 MG tablet Take 1 tablet (25 mg total) by mouth every 6 (six) hours as needed for itching. 30 tablet 0  . JANUVIA 100 MG tablet TAKE 1 TABLET (100 MG TOTAL) BY MOUTH DAILY. 30 tablet 5  . Lancets Thin MISC Check BS BID 100 each 5  . meloxicam (MOBIC) 15 MG tablet TAKE 1 TABLET BY MOUTH ONCE DAILY 90 tablet 1  . metFORMIN (GLUCOPHAGE-XR) 500 MG 24 hr tablet TAKE 4 TABLETS BY MOUTH ONCE DAILY WITH BREAKFAST 120 tablet 5  . TRUEPLUS LANCETS 33G MISC 1 each by Does not apply route 2 (two) times daily. 100 each 11  . TRULICITY 1.5 AJ/2.8NO SOPN INJECT 1.5 MG SUBCUTANEOUSLY INTO THE SKIN ONCE A WEEK 6 pen 3  . blood glucose meter kit and supplies KIT Dispense based on patient and insurance preference. Use twice daily times daily as directed. (FOR ICD-10  E11.65) 1 each 0  . predniSONE (DELTASONE) 20 MG tablet Take 1 tablet (20 mg total) by mouth daily with breakfast for 5 days.  5 tablet 0   No facility-administered medications prior to visit.    Allergies  Allergen Reactions  . Niaspan [Niacin] Rash    ROS Review of Systems  All other systems reviewed and are negative.     Objective:    Physical Exam Vitals and nursing note reviewed.  Constitutional:      General: He is not in  acute distress.    Appearance: Normal appearance. He is not ill-appearing, toxic-appearing or diaphoretic.  HENT:     Head: Normocephalic.  Eyes:     Extraocular Movements: Extraocular movements intact.     Conjunctiva/sclera: Conjunctivae normal.     Pupils: Pupils are equal, round, and reactive to light.  Cardiovascular:     Rate and Rhythm: Normal rate.  Pulmonary:     Effort: Pulmonary effort is normal.  Musculoskeletal:     Right lower leg: No edema.     Left lower leg: No edema.  Skin:    General: Skin is warm and dry.     Findings: Rash present.          Comments: >10 on BLE circular red raised regions consistant with insect stings >5 on back circular red raised regions consistant with insect stings  Neurological:     General: No focal deficit present.     Mental Status: He is alert and oriented to person, place, and time.  Psychiatric:        Mood and Affect: Mood normal.        Behavior: Behavior normal.        Thought Content: Thought content normal.        Judgment: Judgment normal.     BP (!) 144/86 (BP Location: Left Arm, Patient Position: Sitting, Cuff Size: Large)   Pulse 90   Temp 98 F (36.7 C) (Temporal)   Resp 18   Wt 239 lb 3.2 oz (108.5 kg)   SpO2 96%   BMI 31.56 kg/m  Wt Readings from Last 3 Encounters:  08/25/19 239 lb 3.2 oz (108.5 kg)  08/24/19 230 lb (104.3 kg)  08/20/19 237 lb (107.5 kg)     Health Maintenance Due  Topic Date Due  . PNEUMOCOCCAL POLYSACCHARIDE VACCINE AGE 21-64 HIGH RISK  Never done  . COVID-19 Vaccine (1) Never done  . TETANUS/TDAP  07/31/2015  . FOOT EXAM  12/14/2016  . OPHTHALMOLOGY EXAM   09/13/2018    There are no preventive care reminders to display for this patient.  Lab Results  Component Value Date   TSH 0.70 11/01/2016   Lab Results  Component Value Date   WBC 6.9 08/20/2019   HGB 16.2 08/20/2019   HCT 48.4 08/20/2019   MCV 90.5 08/20/2019   PLT 304 08/20/2019   Lab Results  Component Value Date   NA 138 08/20/2019   K 4.6 08/20/2019   CO2 27 08/20/2019   GLUCOSE 182 (H) 08/20/2019   BUN 12 08/20/2019   CREATININE 1.01 08/20/2019   BILITOT 0.7 08/20/2019   ALKPHOS 66 12/15/2015   AST 28 08/20/2019   ALT 25 08/20/2019   PROT 7.5 08/20/2019   ALBUMIN 4.2 12/15/2015   CALCIUM 9.3 08/20/2019   Lab Results  Component Value Date   CHOL 145 08/20/2019   Lab Results  Component Value Date   HDL 27 (L) 08/20/2019   Lab Results  Component Value Date   LDLCALC 98 08/20/2019   Lab Results  Component Value Date   TRIG 104 08/20/2019   Lab Results  Component Value Date   CHOLHDL 5.4 (H) 08/20/2019   Lab Results  Component Value Date   HGBA1C 8.9 (H) 08/20/2019      Assessment & Plan:   Problem List Items Addressed This Visit      Endocrine   Diabetes (Grandfather)   Relevant Medications   blood glucose meter kit and supplies KIT  insulin regular (HUMULIN R) 100 units/mL injection    Other Visit Diagnoses    Bee sting, accidental or unintentional, subsequent encounter    -  Primary   Relevant Medications   predniSONE (DELTASONE) 10 MG tablet    for the multiple bee stings and the allergic response you are having to them: do not take the 73m tablet prednisone tablets the urgent care prescribed for you. Start taking prednisone that I have prescribed today as directed.   Continue to take Pepcid twice daily Over the counter  Continue to take Benadryl but take at bedtime as this makes you drowsy  May take zyrtec or Claritin in the AM  May use topical drying agents such as calamine only do not use other topical remedies such as topical  antihistamine  Heat alternating Ice may give you temporary relief from itching and discomfort  For you concerns with your blood sugars while on steroids. I have prescribed a glucometer with testing supplies, test your sugar fasting just before breakfast, lunch, and dinner. I have prescribed a fast acting insulin to take based on that blood sugar on a sliding scale only as needed. Avoid taking extra doses of your other diabetic medications, avoid taking other persons diabetic insulin and mediations as this is dangerous.   Meds ordered this encounter  Medications  . blood glucose meter kit and supplies KIT    Sig: Fasting prior to each meal three times a day    Dispense:  1 each    Refill:  0    Order Specific Question:   Number of strips    Answer:   [id number]   Order Specific Question:   Number of lancets    Answer:   90  . insulin regular (HUMULIN R) 100 units/mL injection    Sig: Check fasting blood sugar just prior to eating breakfast, lunch, and dinner Based on your blood sugar take insulin as directed: BG 150-200 take 2 units BG 201-250 take 4 units BG 251-300 take 6 units BG 301-350 take 8 units BG 351-400 take 10 units BG >401 take 12 units and call doctor    Dispense:  10 mL    Refill:  11  . predniSONE (DELTASONE) 10 MG tablet    Sig: Day one take six tablets orally Day two take five tablets orally Day three take four tablets orally Day four take three tablets orally Day five take two tablets orally Day six take one tablet orally    Dispense:  21 tablet    Refill:  0    Follow-up: Return if symptoms worsen or fail to improve.    CAnnie Main FNP

## 2019-08-25 NOTE — Patient Instructions (Signed)
for the multiple bee stings and the allergic response you are having to them: do not take the 20mg  tablet prednisone tablets the urgent care prescribed for you. Start taking prednisone that I have prescribed today as directed.   Continue to take Pepcid twice daily Over the counter  Continue to take Benadryl but take at bedtime as this makes you drowsy  May take zyrtec or Claritin in the AM  May use topical drying agents such as calamine only do not use other topical remedies such as topical antihistamine  Heat alternating Ice may give you temporary relief from itching and discomfort  For you concerns with your blood sugars while on steroids. I have prescribed a glucometer with testing supplies, test your sugar fasting just before breakfast, lunch, and dinner. I have prescribed a fast acting insulin to take based on that blood sugar on a sliding scale only as needed. Avoid taking extra doses of your other diabetic medications, avoid taking other persons diabetic insulin and mediations as this is dangerous.

## 2019-08-27 MED FILL — JARDIANCE 25 MG TABLET: 25 | 30 days supply | Qty: 30 | Fill #0

## 2019-09-01 ENCOUNTER — Encounter: Payer: Self-pay | Admitting: Family Medicine

## 2019-09-10 ENCOUNTER — Other Ambulatory Visit: Payer: Self-pay

## 2019-09-10 ENCOUNTER — Ambulatory Visit: Payer: BC Managed Care – PPO | Admitting: Family Medicine

## 2019-09-10 VITALS — BP 150/90 | HR 102 | Temp 99.2°F | Ht 73.0 in | Wt 239.0 lb

## 2019-09-10 DIAGNOSIS — L299 Pruritus, unspecified: Secondary | ICD-10-CM | POA: Diagnosis not present

## 2019-09-10 DIAGNOSIS — R197 Diarrhea, unspecified: Secondary | ICD-10-CM

## 2019-09-10 MED ORDER — HYDROXYZINE PAMOATE 25 MG PO CAPS: 25.0000 mg | ORAL_CAPSULE | Freq: Four times a day (QID) | ORAL | 0 refills | Status: DC | PRN

## 2019-09-10 NOTE — Progress Notes (Signed)
Subjective:    Patient ID: Matthew Osborne, male    DOB: September 11, 1975, 44 y.o.   MRN: [id number]  Patient was stung by numerous yellow jackets more than 2 weeks ago.  Since that time he has been complaining of itching all over his body.  He went to an urgent care where he was started on steroids and instructed to take Benadryl.  He followed up here approximately 2 weeks ago and was instructed to continue Benadryl and Pepcid and finish the steroids.  He is now off the steroids however he continues to have itching.  He states that if he ever gets hot, he will break out in whelps all over his body.  His skin will start itching terribly all over.  Today there is no visible rash.  There are no urticaria.  There are no patches of erythema.  There are no petechiae or purpura.  Patient states that the rash is evanescent and tends to be triggered by heat.  Question cholinergic urticaria.  Given the timeframe from the yellowjacket stings I do not believe an allergic reaction to the venom would still be likely.  Patient states that also this week he began to feel achy all over.  He also reports some mild diarrhea and a mild sore throat.  He has not had his Covid vaccine  Past Medical History:  Diagnosis Date  . De Quervain's tenosynovitis, left 03/2011  . Diabetes mellitus    NIDDM  . Hyperlipidemia   . Hypertension    under control; has been on med. x 4 yrs.  . Nonproliferative retinopathy due to secondary diabetes [facility])    Past Surgical History:  Procedure Laterality Date  . CARPAL TUNNEL RELEASE     bilat.  . DORSAL COMPARTMENT RELEASE  03/14/2011   Procedure: RELEASE DORSAL COMPARTMENT (DEQUERVAIN);  Surgeon: Paulene Floor, MD;  Location: [place];  Service: Orthopedics;  Laterality: Left;  left wrist APL and EPL tenosynovectomy and 1st dorsal compartment release  . TONSILLECTOMY AND ADENOIDECTOMY  as a child  . VASECTOMY     Current Outpatient Medications on File Prior to Visit   Medication Sig Dispense Refill  . albuterol (PROVENTIL HFA;VENTOLIN HFA) 108 (90 Base) MCG/ACT inhaler Inhale 2 puffs into the lungs every 4 (four) hours as needed for wheezing or shortness of breath. 1 Inhaler 0  . aspirin 81 MG tablet Take 81 mg by mouth daily.    [name] atorvastatin (LIPITOR) 40 MG tablet TAKE 1 TABLET BY MOUTH DAILY. 30 tablet 5  . bisoprolol-hydrochlorothiazide (ZIAC) 10-6.25 MG tablet TAKE 1 TABLET BY MOUTH EVERY DAY IN THE MORNING 90 tablet 3  . blood glucose meter kit and supplies KIT Fasting prior to each meal three times a day 1 each 0  . Choline Fenofibrate (FENOFIBRIC ACID) 135 MG CPDR TAKE 1 CAPSULE BY MOUTH EVERY DAY IN THE EVENING 30 capsule 5  . diclofenac sodium (VOLTAREN) 1 % GEL Apply 2 g topically 4 (four) times daily. 100 g 1  . empagliflozin (JARDIANCE) 25 MG TABS tablet Take 1 tablet (25 mg total) by mouth daily before breakfast. 30 tablet 3  . gabapentin (NEURONTIN) 300 MG capsule Take 1 capsule (300 mg total) by mouth 3 (three) times daily. 90 capsule 3  . glipiZIDE (GLUCOTROL XL) 10 MG 24 hr tablet TAKE 1 TABLET (10 MG TOTAL) BY MOUTH DAILY. 30 tablet 5  . glucose blood test strip Dispense based on patient and insurance preference.  Use  twice daily as directed. (FOR ICD-10 E11.65) 100 each 11  . hydrOXYzine (ATARAX/VISTARIL) 25 MG tablet Take 1 tablet (25 mg total) by mouth every 6 (six) hours as needed for itching. 30 tablet 0  . insulin regular (NOVOLIN R) 100 units/mL injection Check fasting blood sugar just prior to eating breakfast, lunch, and dinner Based on your blood sugar take insulin as directed: BG 150-200 take 2 units BG 201-250 take 4 units BG 251-300 take 6 units BG 301-350 take 8 units BG 351-400 take 10 units BG >401 take 12 units and call doctor 10 mL 0  . JANUVIA 100 MG tablet TAKE 1 TABLET (100 MG TOTAL) BY MOUTH DAILY. 30 tablet 5  . Lancets Thin MISC Check BS BID 100 each 5  . meloxicam (MOBIC) 15 MG tablet TAKE 1 TABLET BY MOUTH ONCE DAILY  90 tablet 1  . metFORMIN (GLUCOPHAGE-XR) 500 MG 24 hr tablet TAKE 4 TABLETS BY MOUTH ONCE DAILY WITH BREAKFAST 120 tablet 5  . predniSONE (DELTASONE) 10 MG tablet Day one take six tablets orally Day two take five tablets orally Day three take four tablets orally Day four take three tablets orally Day five take two tablets orally Day six take one tablet orally 21 tablet 0  . TRUEPLUS LANCETS 33G MISC 1 each by Does not apply route 2 (two) times daily. 100 each 11  . TRULICITY 1.5 PP/2.9JJ SOPN INJECT 1.5 MG SUBCUTANEOUSLY INTO THE SKIN ONCE A WEEK 6 pen 3   No current facility-administered medications on file prior to visit.   Allergies  Allergen Reactions  . Niaspan [Niacin] Rash   Social History   Socioeconomic History  . Marital status: Married    Spouse name: Not on file  . Number of children: Not on file  . Years of education: Not on file  . Highest education level: Not on file  Occupational History  . Not on file  Tobacco Use  . Smoking status: Former Smoker    Packs/day: 3.00    Years: 6.00    Pack years: 18.00    Types: Cigarettes    Quit date: 02/05/1999    Years since quitting: 20.6  . Smokeless tobacco: Never Used  Substance and Sexual Activity  . Alcohol use: No    Alcohol/week: 0.0 standard drinks  . Drug use: No  . Sexual activity: Not on file  Other Topics Concern  . Not on file  Social History Narrative  . Not on file   Social Determinants of Health   Financial Resource Strain:   . Difficulty of Paying Living Expenses:   Food Insecurity:   . Worried About Charity fundraiser in the Last Year:   . Arboriculturist in the Last Year:   Transportation Needs:   . Film/video editor (Medical):   [name] Lack of Transportation (Non-Medical):   Physical Activity:   . Days of Exercise per Week:   . Minutes of Exercise per Session:   Stress:   . Feeling of Stress :   Social Connections:   . Frequency of Communication with Friends and Family:   . Frequency of  Social Gatherings with Friends and Family:   . Attends Religious Services:   . Active Member of Clubs or Organizations:   . Attends Archivist Meetings:   [name] Marital Status:   Intimate Partner Violence:   . Fear of Current or Ex-Partner:   . Emotionally Abused:   [name] Physically Abused:   .  Sexually Abused:       Review of Systems  All other systems reviewed and are negative.      Objective:   Physical Exam Vitals reviewed.  Constitutional:      General: He is not in acute distress.    Appearance: Normal appearance. He is not ill-appearing, toxic-appearing or diaphoretic.  HENT:     Right Ear: Tympanic membrane and ear canal normal.     Left Ear: Tympanic membrane and ear canal normal.     Nose: Nose normal. No congestion or rhinorrhea.     Mouth/Throat:     Mouth: Mucous membranes are moist.     Pharynx: Oropharynx is clear. No oropharyngeal exudate or posterior oropharyngeal erythema.  Eyes:     General:        Right eye: No discharge.        Left eye: No discharge.     Conjunctiva/sclera: Conjunctivae normal.  Cardiovascular:     Rate and Rhythm: Normal rate and regular rhythm.     Pulses: Normal pulses.     Heart sounds: Normal heart sounds. No murmur heard.   Pulmonary:     Effort: Pulmonary effort is normal. No respiratory distress.     Breath sounds: Normal breath sounds. No stridor. No wheezing, rhonchi or rales.  Lymphadenopathy:     Cervical: No cervical adenopathy.  Neurological:     Mental Status: He is alert.           Assessment & Plan:  Itching - Plan: CBC with Differential/Platelet, COMPLETE METABOLIC PANEL WITH GFR, SARS-COV-2 RNA,(COVID-19) QUAL NAAT  Diarrhea, unspecified type - Plan: SARS-COV-2 RNA,(COVID-19) QUAL NAAT  Given the diarrhea and the nonspecific viral symptoms I will screen the patient for COVID-19.  However I do not believe that is causing his itching.  Given the timeframe from the yellowjacket stings, I do not  believe that this would still be some type of allergic reaction.  I question if the patient may have cholinergic urticaria particular given the association with heat.  I recommended hydroxyzine 25 mg every 8 hours as needed for itching.  If this does not work we may need to add back gabapentin for itching as well.  Meanwhile check CBC to evaluate for polycythemia.  Check CMP to monitor bilirubin along with renal function.

## 2019-09-11 LAB — CBC WITH DIFFERENTIAL/PLATELET
Absolute Monocytes: 736 cells/uL (ref 200–950)
Basophils Absolute: 81 cells/uL (ref 0–200)
Basophils Relative: 0.7 %
Eosinophils Absolute: 1507 cells/uL — ABNORMAL HIGH (ref 15–500)
Eosinophils Relative: 13.1 %
HCT: 45.7 % (ref 38.5–50.0)
Hemoglobin: 14.9 g/dL (ref 13.2–17.1)
Lymphs Abs: 1679 cells/uL (ref 850–3900)
MCH: 29.7 pg (ref 27.0–33.0)
MCHC: 32.6 g/dL (ref 32.0–36.0)
MCV: 91.2 fL (ref 80.0–100.0)
MPV: 10.2 fL (ref 7.5–12.5)
Monocytes Relative: 6.4 %
Neutro Abs: 7498 cells/uL (ref 1500–7800)
Neutrophils Relative %: 65.2 %
Platelets: 273 10*3/uL (ref 140–400)
RBC: 5.01 10*6/uL (ref 4.20–5.80)
RDW: 11.8 % (ref 11.0–15.0)
Total Lymphocyte: 14.6 %
WBC: 11.5 10*3/uL — ABNORMAL HIGH (ref 3.8–10.8)

## 2019-09-11 LAB — COMPLETE METABOLIC PANEL WITH GFR
AG Ratio: 1.5 (calc) (ref 1.0–2.5)
ALT: 28 U/L (ref 9–46)
AST: 33 U/L (ref 10–40)
Albumin: 4.2 g/dL (ref 3.6–5.1)
Alkaline phosphatase (APISO): 102 U/L (ref 36–130)
BUN: 13 mg/dL (ref 7–25)
CO2: 25 mmol/L (ref 20–32)
Calcium: 9.3 mg/dL (ref 8.6–10.3)
Chloride: 98 mmol/L (ref 98–110)
Creat: 1.1 mg/dL (ref 0.60–1.35)
GFR, Est African American: 94 mL/min/{1.73_m2} (ref >=60)
GFR, Est Non African American: 81 mL/min/{1.73_m2} (ref >=60)
Globulin: 2.8 g/dL (calc) (ref 1.9–3.7)
Glucose, Bld: 374 mg/dL — ABNORMAL HIGH (ref 65–99)
Potassium: 4.4 mmol/L (ref 3.5–5.3)
Sodium: 134 mmol/L — ABNORMAL LOW (ref 135–146)
Total Bilirubin: 0.5 mg/dL (ref 0.2–1.2)
Total Protein: 7 g/dL (ref 6.1–8.1)

## 2019-09-11 LAB — SARS-COV-2 RNA,(COVID-19) QUALITATIVE NAAT: SARS CoV2 RNA: NOT DETECTED

## 2019-12-09 ENCOUNTER — Other Ambulatory Visit: Payer: Self-pay

## 2019-12-09 ENCOUNTER — Ambulatory Visit (INDEPENDENT_AMBULATORY_CARE_PROVIDER_SITE_OTHER): Payer: No Typology Code available for payment source | Admitting: Cardiology

## 2019-12-09 ENCOUNTER — Encounter: Payer: Self-pay | Admitting: Cardiology

## 2019-12-09 VITALS — BP 176/100 | HR 76 | Ht 74.0 in | Wt 223.0 lb

## 2019-12-09 DIAGNOSIS — R0602 Shortness of breath: Secondary | ICD-10-CM | POA: Diagnosis not present

## 2019-12-09 DIAGNOSIS — I10 Essential (primary) hypertension: Secondary | ICD-10-CM

## 2019-12-09 MED ORDER — BISOPROLOL-HYDROCHLOROTHIAZIDE 10-6.25 MG PO TABS
ORAL_TABLET | ORAL | 3 refills | Status: DC
  Filled 2020-05-05: qty 90, 90d supply, fill #0
  Filled 2020-09-26: qty 90, 90d supply, fill #1

## 2019-12-09 MED FILL — BISOPROLOL-HYDROCHLOROTHIAZ: 10-6.25 | 90 days supply | Qty: 90 | Fill #0

## 2019-12-09 NOTE — Progress Notes (Signed)
   Clinical Summary Mr. Rosamilia is a 44 y.o.male seen as new consult, referred by Dr Pickard for the following medical problems.    1. DOE - started about 8 months ago - started DOE with some of his regular work activities - can have some coughing at times - symptoms with moderate to high levels of activity - rare infrequent chest pains, left sided into back. Not positional, would last about 30 seconds - no recent edema - overall stable onset.   CAD risk factors: DM2, HTN, +HL.    2. HTN - ran out of meds 2 days ago.  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   Past Medical History:  Diagnosis Date  . De Quervain's tenosynovitis, left 03/2011  . Diabetes mellitus    NIDDM  . Hyperlipidemia   . Hypertension    under control; has been on med. x 4 yrs.  . Nonproliferative retinopathy due to secondary diabetes (HCC)      Allergies  Allergen Reactions  . Niaspan [Niacin] Rash     Current Outpatient Medications  Medication Sig Dispense Refill  . albuterol (PROVENTIL HFA;VENTOLIN HFA) 108 (90 Base) MCG/ACT inhaler Inhale 2 puffs into the lungs every 4 (four) hours as needed for wheezing or shortness of breath. 1 Inhaler 0  . aspirin 81 MG tablet Take 81 mg by mouth daily.    . atorvastatin (LIPITOR) 40 MG tablet TAKE 1 TABLET BY MOUTH DAILY. 30 tablet 5  . bisoprolol-hydrochlorothiazide (ZIAC) 10-6.25 MG tablet TAKE 1 TABLET BY MOUTH EVERY DAY IN THE MORNING 90 tablet 3  . blood glucose meter kit and supplies KIT Fasting prior to each meal three times a day 1 each 0  . Choline Fenofibrate (FENOFIBRIC ACID) 135 MG CPDR TAKE 1 CAPSULE BY MOUTH EVERY DAY IN THE EVENING 30 capsule 5  . diclofenac sodium (VOLTAREN) 1 % GEL Apply 2 g topically 4 (four) times daily. 100 g 1  . empagliflozin (JARDIANCE) 25 MG TABS tablet Take 1 tablet (25 mg total) by mouth daily before breakfast.  30 tablet 3  . gabapentin (NEURONTIN) 300 MG capsule Take 1 capsule (300 mg total) by mouth 3 (three) times daily. 90 capsule 3  . glipiZIDE (GLUCOTROL XL) 10 MG 24 hr tablet TAKE 1 TABLET (10 MG TOTAL) BY MOUTH DAILY. 30 tablet 5  . glucose blood test strip Dispense based on patient and insurance preference.  Use twice daily as directed. (FOR ICD-10 E11.65) 100 each 11  . hydrOXYzine (ATARAX/VISTARIL) 25 MG tablet Take 1 tablet (25 mg total) by mouth every 6 (six) hours as needed for itching. 30 tablet 0  . hydrOXYzine (VISTARIL) 25 MG capsule Take 1 capsule (25 mg total) by mouth every 6 (six) hours as needed for itching. 30 capsule 0  . insulin regular (NOVOLIN R) 100 units/mL injection Check fasting blood sugar just prior to eating breakfast, lunch, and dinner Based on your blood sugar take insulin as directed: BG 150-200 take 2 units BG 201-250 take 4 units BG 251-300 take 6 units BG 301-350 take 8 units BG 351-400 take 10 units BG >401 take 12 units and call doctor 10 mL 0  . JANUVIA 100 MG tablet TAKE 1 TABLET (100 MG TOTAL) BY MOUTH DAILY. 30 tablet 5  . Lancets Thin MISC Check BS BID 100 each 5  . meloxicam (MOBIC) 15 MG   tablet TAKE 1 TABLET BY MOUTH ONCE DAILY 90 tablet 1  . metFORMIN (GLUCOPHAGE-XR) 500 MG 24 hr tablet TAKE 4 TABLETS BY MOUTH ONCE DAILY WITH BREAKFAST 120 tablet 5  . predniSONE (DELTASONE) 10 MG tablet Day one take six tablets orally Day two take five tablets orally Day three take four tablets orally Day four take three tablets orally Day five take two tablets orally Day six take one tablet orally 21 tablet 0  . TRUEPLUS LANCETS 33G MISC 1 each by Does not apply route 2 (two) times daily. 100 each 11  . TRULICITY 1.5 MG/0.5ML SOPN INJECT 1.5 MG SUBCUTANEOUSLY INTO THE SKIN ONCE A WEEK 6 pen 3   No current facility-administered medications for this visit.     Past Surgical History:  Procedure Laterality Date  . CARPAL TUNNEL RELEASE     bilat.  . DORSAL COMPARTMENT  RELEASE  03/14/2011   Procedure: RELEASE DORSAL COMPARTMENT (DEQUERVAIN);  Surgeon: William M Gramig III, MD;  Location: [facility];  Service: Orthopedics;  Laterality: Left;  left wrist APL and EPL tenosynovectomy and 1st dorsal compartment release  . TONSILLECTOMY AND ADENOIDECTOMY  as a child  . VASECTOMY       Allergies  Allergen Reactions  . Niaspan [Niacin] Rash      Family History  Problem Relation Age of Onset  . Cancer Other   . Hyperlipidemia Other   . Hypertension Brother   . Asthma Son   . Lung cancer Maternal Grandfather   . Rheum arthritis Maternal Grandmother      Social History Mr. Vetrano reports that he quit smoking about 20 years ago. His smoking use included cigarettes. He has a 18.00 pack-year smoking history. He has never used smokeless tobacco. Mr. Haslam reports no history of alcohol use.   Review of Systems CONSTITUTIONAL: No weight loss, fever, chills, weakness or fatigue.  HEENT: Eyes: No visual loss, blurred vision, double vision or yellow sclerae.No hearing loss, sneezing, congestion, runny nose or sore throat.  SKIN: No rash or itching.  CARDIOVASCULAR: per hpi RESPIRATORY: No shortness of breath, cough or sputum.  GASTROINTESTINAL: No anorexia, nausea, vomiting or diarrhea. No abdominal pain or blood.  GENITOURINARY: No burning on urination, no polyuria NEUROLOGICAL: No headache, dizziness, syncope, paralysis, ataxia, numbness or tingling in the extremities. No change in bowel or bladder control.  MUSCULOSKELETAL: No muscle, back pain, joint pain or stiffness.  LYMPHATICS: No enlarged nodes. No history of splenectomy.  PSYCHIATRIC: No history of depression or anxiety.  ENDOCRINOLOGIC: No reports of sweating, cold or heat intolerance. No polyuria or polydipsia.  .   Physical Examination Today's Vitals   12/09/19 1335  BP: (!) 176/100  Pulse: 76  SpO2: 99%  Weight: 223 lb (101.2 kg)  Height: 6' 2" (1.88 m)   Body mass  index is 28.63 kg/m.  Gen: resting comfortably, no acute distress HEENT: no scleral icterus, pupils equal round and reactive, no palptable cervical adenopathy,  CV: RRR, no m/r/g, no jvd Resp: Clear to auscultation bilaterally GI: abdomen is soft, non-tender, non-distended, normal bowel sounds, no hepatosplenomegaly MSK: extremities are warm, no edema.  Skin: warm, no rash Neuro:  no focal deficits Psych: appropriate affect     Assessment and Plan  1. DOE - unclear etiology - multiple cardiac risk factors including DM2 - will start workup with echo, pending results likely plan for GXT - EKG shows SR, no ischemic changes  2. HTN - elevated today but ran out of   bp meds, will refill. Was controlled at recent pcp visit    Arnoldo Lenis, M.D.

## 2019-12-09 NOTE — Patient Instructions (Signed)
Medication Instructions:  Your physician recommends that you continue on your current medications as directed. Please refer to the Current Medication list given to you today.  *If you need a refill on your cardiac medications before your next appointment, please call your pharmacy*   Lab Work: None today If you have labs (blood work) drawn today and your tests are completely normal, you will receive your results only by: [name] MyChart Message (if you have MyChart) OR . A paper copy in the mail If you have any lab test that is abnormal or we need to change your treatment, we will call you to review the results.   Testing/Procedures: Your physician has requested that you have an echocardiogram. Echocardiography is a painless test that uses sound waves to create images of your heart. It provides your doctor with information about the size and shape of your heart and how well your heart's chambers and valves are working. This procedure takes approximately one hour. There are no restrictions for this procedure.     Follow-Up: At [facility], you and your health needs are our priority.  As part of our continuing mission to provide you with exceptional heart care, we have created designated Provider Care Teams.  These Care Teams include your primary Cardiologist (physician) and Advanced Practice Providers (APPs -  Physician Assistants and Nurse Practitioners) who all work together to provide you with the care you need, when you need it.  We recommend signing up for the patient portal called "MyChart".  Sign up information is provided on this After Visit Summary.  MyChart is used to connect with patients for Virtual Visits (Telemedicine).  Patients are able to view lab/test results, encounter notes, upcoming appointments, etc.  Non-urgent messages can be sent to your provider as well.   To learn more about what you can do with MyChart, go to [email or web address].    Your next appointment:   6  week(s)  The format for your next appointment:   In Person  Provider:   You will see one of the following Advanced Practice Providers on your designated Care Team:    Bernerd Pho, PA-C   Ermalinda Barrios, PA-C       Other Instructions None      Thank you for choosing [facility] !

## 2019-12-15 ENCOUNTER — Ambulatory Visit (HOSPITAL_COMMUNITY)
Admission: RE | Admit: 2019-12-15 | Discharge: 2019-12-15 | Disposition: A | Discharge status: Home / Self Care | Payer: No Typology Code available for payment source | Source: Ambulatory Visit | Attending: Cardiology | Admitting: Cardiology

## 2019-12-15 ENCOUNTER — Other Ambulatory Visit: Payer: Self-pay

## 2019-12-15 DIAGNOSIS — R0602 Shortness of breath: Secondary | ICD-10-CM | POA: Insufficient documentation

## 2019-12-15 LAB — ECHOCARDIOGRAM COMPLETE
AR max vel: 1.89 cm2
AV Area VTI: 1.73 cm2
AV Area mean vel: 2 cm2
AV Mean grad: 5.8 mmHg
AV Peak grad: 12.7 mmHg
Ao pk vel: 1.78 m/s
Area-P 1/2: 2 cm2
S' Lateral: 3 cm

## 2019-12-15 NOTE — Progress Notes (Signed)
*  PRELIMINARY RESULTS* Echocardiogram 2D Echocardiogram has been performed.  Matthew Osborne 12/15/2019, 12:27 PM

## 2019-12-21 ENCOUNTER — Telehealth: Payer: Self-pay | Admitting: *Deleted

## 2019-12-21 DIAGNOSIS — R0600 Dyspnea, unspecified: Secondary | ICD-10-CM

## 2019-12-21 DIAGNOSIS — R0609 Other forms of dyspnea: Secondary | ICD-10-CM

## 2019-12-21 NOTE — Telephone Encounter (Signed)
Pt voiced understanding and agreeable to stress test - voiced understanding of holding Ziac and stress test instructions - orders placed and will forward to schedulers

## 2019-12-21 NOTE — Telephone Encounter (Signed)
-----   Message from Arnoldo Lenis, MD sent at 12/20/2019  8:58 AM EST ----- Echo overall looks good, overall his heart function is normal. Can we obtain a GXT for dyspnea on exertion, needs to hold his ziac (bisoprolol/hctz) the day of   J BrancH MD

## 2019-12-27 ENCOUNTER — Telehealth: Payer: Self-pay | Admitting: Cardiology

## 2019-12-27 ENCOUNTER — Other Ambulatory Visit (HOSPITAL_COMMUNITY)
Admission: RE | Admit: 2019-12-27 | Discharge: 2019-12-27 | Disposition: A | Discharge status: Home / Self Care | Payer: No Typology Code available for payment source | Source: Ambulatory Visit | Attending: Cardiology | Admitting: Cardiology

## 2019-12-27 ENCOUNTER — Other Ambulatory Visit: Payer: Self-pay

## 2019-12-27 DIAGNOSIS — Z01812 Encounter for preprocedural laboratory examination: Secondary | ICD-10-CM | POA: Insufficient documentation

## 2019-12-27 DIAGNOSIS — Z20822 Contact with and (suspected) exposure to covid-19: Secondary | ICD-10-CM | POA: Diagnosis not present

## 2019-12-27 LAB — SARS CORONAVIRUS 2 (TAT 6-24 HRS): SARS Coronavirus 2: NEGATIVE

## 2019-12-27 NOTE — Telephone Encounter (Signed)
Pre-cert Verification for the following procedure    GXT  DATE:  12/29/2019  LOCATION:   [facility]

## 2019-12-29 ENCOUNTER — Other Ambulatory Visit: Payer: Self-pay

## 2019-12-29 ENCOUNTER — Ambulatory Visit (HOSPITAL_COMMUNITY)
Admission: RE | Admit: 2019-12-29 | Discharge: 2019-12-29 | Disposition: A | Discharge status: Home / Self Care | Payer: No Typology Code available for payment source | Source: Ambulatory Visit | Attending: Cardiology | Admitting: Cardiology

## 2019-12-29 DIAGNOSIS — R0600 Dyspnea, unspecified: Secondary | ICD-10-CM | POA: Diagnosis not present

## 2019-12-29 DIAGNOSIS — R0609 Other forms of dyspnea: Secondary | ICD-10-CM

## 2019-12-29 LAB — EXERCISE TOLERANCE TEST
Estimated workload: 11.1 METS
Exercise duration (min): 9 min
Exercise duration (sec): 23 s
MPHR: 176 {beats}/min
Peak HR: 137 {beats}/min
Percent HR: 77 %
RPE: 13
Rest HR: 73 {beats}/min

## 2020-01-01 ENCOUNTER — Encounter: Payer: Self-pay | Admitting: Emergency Medicine

## 2020-01-01 ENCOUNTER — Ambulatory Visit
Admission: EM | Admit: 2020-01-01 | Discharge: 2020-01-01 | Disposition: A | Discharge status: Home / Self Care | Payer: No Typology Code available for payment source | Attending: Family Medicine | Admitting: Family Medicine

## 2020-01-01 ENCOUNTER — Other Ambulatory Visit: Payer: Self-pay

## 2020-01-01 ENCOUNTER — Ambulatory Visit (INDEPENDENT_AMBULATORY_CARE_PROVIDER_SITE_OTHER): Payer: No Typology Code available for payment source

## 2020-01-01 DIAGNOSIS — R059 Cough, unspecified: Secondary | ICD-10-CM | POA: Diagnosis not present

## 2020-01-01 DIAGNOSIS — J209 Acute bronchitis, unspecified: Secondary | ICD-10-CM | POA: Diagnosis not present

## 2020-01-01 DIAGNOSIS — R0602 Shortness of breath: Secondary | ICD-10-CM | POA: Diagnosis not present

## 2020-01-01 MED ORDER — BENZONATATE 100 MG PO CAPS: 100.0000 mg | ORAL_CAPSULE | Freq: Three times a day (TID) | ORAL | 0 refills | Status: DC | PRN

## 2020-01-01 MED ORDER — PREDNISONE 20 MG PO TABS
40.0000 mg | ORAL_TABLET | Freq: Every day | ORAL | 0 refills | Status: DC
Start: 2020-01-01 — End: 2020-01-11

## 2020-01-01 NOTE — ED Provider Notes (Signed)
[facility]    CSN: [id number] Arrival date & time: 01/01/20  0818      History   Chief Complaint Chief Complaint  Patient presents with  . Cough    HPI Matthew Osborne is a 44 y.o. male.   HPI  Patient presents today with symptoms of body ache, persistent cough, chills and and feeling feverish.  Symptoms have been present x4 to 5 days.  He had a negative COVID-19 test 5 days ago for preprocedural testing.  He reports since he had an echocardiogram 3 days ago his symptoms have gradually worsened.  He has been evaluated by pulmonology for chronic cough which has been ongoing for an extended period of time however cough has worsened and has become more persistent.  Cough is nonproductive.  He has a low-grade temperature on arrival here today.  No known sick contacts.  Past Medical History:  Diagnosis Date  . De Quervain's tenosynovitis, left 03/2011  . Diabetes mellitus    NIDDM  . Hyperlipidemia   . Hypertension    under control; has been on med. x 4 yrs.  . Nonproliferative retinopathy due to secondary diabetes [facility])     Patient Active Problem List   Diagnosis Date Noted  . Left elbow pain 05/19/2019  . Numbness 05/19/2019  . Nonproliferative retinopathy due to secondary diabetes ([place])   . Allergic rhinitis 06/17/2014  . Sinus congestion 05/27/2014  . Chronic cough 02/14/2014  . Essential hypertension, benign 06/30/2013  . Hyperlipidemia 06/30/2013  . Diabetes ([place]) 06/30/2013    Past Surgical History:  Procedure Laterality Date  . CARPAL TUNNEL RELEASE     bilat.  . DORSAL COMPARTMENT RELEASE  03/14/2011   Procedure: RELEASE DORSAL COMPARTMENT (DEQUERVAIN);  Surgeon: Paulene Floor, MD;  Location: [facility];  Service: Orthopedics;  Laterality: Left;  left wrist APL and EPL tenosynovectomy and 1st dorsal compartment release  . TONSILLECTOMY AND ADENOIDECTOMY  as a child  . VASECTOMY         Home Medications    Prior to  Admission medications   Medication Sig Start Date End Date Taking? Authorizing Provider  aspirin 81 MG tablet Take 81 mg by mouth daily.    [provider]  atorvastatin (LIPITOR) 40 MG tablet TAKE 1 TABLET BY MOUTH DAILY. 12/11/18   Susy Frizzle, MD  bisoprolol-hydrochlorothiazide [facility]) 10-6.25 MG tablet TAKE 1 TABLET BY MOUTH EVERY DAY IN THE MORNING 12/09/19   Arnoldo Lenis, MD  blood glucose meter kit and supplies KIT Fasting prior to each meal three times a day 08/25/19   Ishmael Holter A, FNP  Choline Fenofibrate (FENOFIBRIC ACID) 135 MG CPDR TAKE 1 CAPSULE BY MOUTH EVERY DAY IN THE EVENING 08/31/18   Susy Frizzle, MD  gabapentin (NEURONTIN) 300 MG capsule Take 1 capsule (300 mg total) by mouth 3 (three) times daily. 04/19/19   Susy Frizzle, MD  glipiZIDE (GLUCOTROL XL) 10 MG 24 hr tablet TAKE 1 TABLET (10 MG TOTAL) BY MOUTH DAILY. 12/11/18   Susy Frizzle, MD  glucose blood test strip Dispense based on patient and insurance preference.  Use twice daily as directed. (FOR ICD-10 E11.65) 08/29/17   Susy Frizzle, MD  insulin regular (NOVOLIN R) 100 units/mL injection Check fasting blood sugar just prior to eating breakfast, lunch, and dinner Based on your blood sugar take insulin as directed: BG 150-200 take 2 units BG 201-250 take 4 units BG 251-300 take 6 units  BG 301-350 take 8 units BG 351-400 take 10 units BG >401 take 12 units and call doctor 08/25/19   Ishmael Holter A, FNP  JANUVIA 100 MG tablet TAKE 1 TABLET (100 MG TOTAL) BY MOUTH DAILY. Patient not taking: Reported on 12/09/2019 12/11/18   Susy Frizzle, MD  Lancets Thin MISC Check BS BID 08/29/17   Susy Frizzle, MD  metFORMIN (GLUCOPHAGE-XR) 500 MG 24 hr tablet TAKE 4 TABLETS BY MOUTH ONCE DAILY WITH BREAKFAST 12/11/18   Susy Frizzle, MD  TRUEPLUS LANCETS 33G MISC 1 each by Does not apply route 2 (two) times daily. 07/06/13   Susy Frizzle, MD  TRULICITY 1.5 QM/2.5OI SOPN INJECT 1.5 MG  SUBCUTANEOUSLY INTO THE SKIN ONCE A WEEK 02/12/19   Susy Frizzle, MD    Family History Family History  Problem Relation Age of Onset  . Cancer Other   . Hyperlipidemia Other   . Hypertension Brother   . Asthma Son   . Lung cancer Maternal Grandfather   . Rheum arthritis Maternal Grandmother     Social History Social History   Tobacco Use  . Smoking status: Former Smoker    Packs/day: 3.00    Years: 6.00    Pack years: 18.00    Types: Cigarettes    Quit date: 02/05/1999    Years since quitting: 20.9  . Smokeless tobacco: Never Used  Vaping Use  . Vaping Use: Never used  Substance Use Topics  . Alcohol use: No    Alcohol/week: 0.0 standard drinks  . Drug use: No     Allergies   Niaspan [niacin]   Review of Systems Review of Systems Pertinent negatives listed in HPI   Physical Exam Triage Vital Signs ED Triage Vitals  Enc Vitals Group     BP 01/01/20 0822 (!) 148/99     Pulse Rate 01/01/20 0822 87     Resp 01/01/20 0822 (!) 22     Temp 01/01/20 0822 99.2 F (37.3 C)     Temp Source 01/01/20 0822 Oral     SpO2 01/01/20 0822 97 %     Weight --      Height --      Head Circumference --      Peak Flow --      Pain Score 01/01/20 0831 0     Pain Loc --      Pain Edu? --      Excl. in Center? --    No data found.  Updated Vital Signs BP (!) 148/99 (BP Location: Right Arm)   Pulse 87   Temp 99.2 F (37.3 C) (Oral)   Resp (!) 22   SpO2 97%   Visual Acuity Right Eye Distance:   Left Eye Distance:   Bilateral Distance:    Right Eye Near:   Left Eye Near:    Bilateral Near:     Physical Exam Constitutional:      Appearance: Normal appearance.  Cardiovascular:     Rate and Rhythm: Normal rate and regular rhythm.     Heart sounds: Normal heart sounds.  Pulmonary:     Breath sounds: No decreased air movement.     Comments: Coarse lungs sound noted on exam Neurological:     Mental Status: He is alert.  Psychiatric:        Mood and Affect: Mood  is anxious. Affect is blunt.        Speech: Speech normal.  Cognition and Memory: Cognition normal.        Judgment: Judgment normal.      UC Treatments / Results  Labs (all labs ordered are listed, but only abnormal results are displayed) Labs Reviewed - No data to display  EKG   Radiology No results found.  Procedures Procedures (including critical care time)  Medications Ordered in UC Medications - No data to display  Initial Impression / Assessment and Plan / UC Course  I have reviewed the triage vital signs and the nursing notes.  Pertinent labs & imaging results that were available during my care of the patient were reviewed by me and considered in my medical decision making (see chart for details).    Treating for acute bronchitis.  Discharge medications prescribed per below. If symptoms worsen go immediately to the ER.  Follow-up with primary care provider as needed. Final Clinical Impressions(s) / UC Diagnoses   Final diagnoses:  Acute bronchitis, unspecified organism     Discharge Instructions     Take medication as directed. Follow-up with primary care provider if symptoms worsen or do not improve,    ED Prescriptions    Medication Sig Dispense Auth. Provider   predniSONE (DELTASONE) 20 MG tablet Take 2 tablets (40 mg total) by mouth daily with breakfast. 10 tablet Scot Jun, FNP   benzonatate (TESSALON) 100 MG capsule Take 1-2 capsules (100-200 mg total) by mouth 3 (three) times daily as needed for cough. 40 capsule Scot Jun, FNP     PDMP not reviewed this encounter.   Scot Jun, [place] 01/06/20 [phone]

## 2020-01-01 NOTE — ED Triage Notes (Signed)
Pt states he started having a chronic cough x 1 year ago. Seen by pulmonologist and given inhaler.  Pt states he got better for a while.  2 months ago pt is having the cough again and fatigue to the point of where he cant stand. Recently seen by cardiologist and had stress test and ekg.  Pt reports since seeing cardiologist he has had constant coughing spells with fatigue and chills.  Had neg covid test at [facility] on Monday.  Pt is worried he may have PNE.

## 2020-01-01 NOTE — Discharge Instructions (Addendum)
Take medication as directed. Follow-up with primary care provider if symptoms worsen or do not improve,

## 2020-01-06 ENCOUNTER — Telehealth: Payer: Self-pay | Admitting: *Deleted

## 2020-01-06 NOTE — Telephone Encounter (Signed)
-----   Message from Arnoldo Lenis, MD sent at 01/04/2020 11:17 AM EST ----- Was not able to get HR up enough for stress test, please order a lexiscan for shortness of breath.  Zandra Abts MD

## 2020-01-06 NOTE — Telephone Encounter (Signed)
Pt aware and says he has been sick since having stress test - went to urgent care ([facility]) 11/27 and was told he had bronchitis and given prednisone (last dose today) - pt is still having coughing spells - wants DR Branch's recs on whether he should have lexiscan with his reaction - explained that he was probably already sick and the exercise from stress test exacerbated his symptoms - pt is still concerned with having the nuclear stress test and aware that we could wait until he is feeling better

## 2020-01-07 ENCOUNTER — Telehealth: Payer: Self-pay | Admitting: Family Medicine

## 2020-01-07 NOTE — Telephone Encounter (Signed)
Would like to have a referral for a pulmonary doctor

## 2020-01-07 NOTE — Telephone Encounter (Signed)
Would want to wait until bronchitis resolved before lexiscan, the medicine can sometimes cause some tightening of the airways which could make cough and SOB temporarily worst. Call us to schedule when bronchitis has resolved   Zandra Abts MD

## 2020-01-07 NOTE — Telephone Encounter (Signed)
Pt voiced understanding and will contact us after he is feeling better

## 2020-01-11 ENCOUNTER — Ambulatory Visit (INDEPENDENT_AMBULATORY_CARE_PROVIDER_SITE_OTHER): Payer: No Typology Code available for payment source | Admitting: Family Medicine

## 2020-01-11 ENCOUNTER — Encounter: Payer: Self-pay | Admitting: Family Medicine

## 2020-01-11 ENCOUNTER — Other Ambulatory Visit: Payer: Self-pay

## 2020-01-11 VITALS — BP 140/80 | HR 77 | Temp 97.8°F | Ht 74.0 in | Wt 220.0 lb

## 2020-01-11 DIAGNOSIS — R059 Cough, unspecified: Secondary | ICD-10-CM | POA: Diagnosis not present

## 2020-01-11 DIAGNOSIS — R053 Chronic cough: Secondary | ICD-10-CM | POA: Diagnosis not present

## 2020-01-11 MED ORDER — TRAMADOL HCL 50 MG PO TABS: 50.0000 mg | ORAL_TABLET | Freq: Four times a day (QID) | ORAL | 0 refills | Status: AC | PRN

## 2020-01-11 NOTE — Telephone Encounter (Signed)
Pt was in office today covid tested and labs done

## 2020-01-11 NOTE — Progress Notes (Signed)
Subjective:    Patient ID: Matthew Osborne, male    DOB: 1975/03/20, 44 y.o.   MRN: [id number]  Patient is a 43 year old white male who presents today with constant coughing.  He states the coughing began in November.  Originally was brought on by activity however now he has a cough that will not stop.  In fact I can hear him coughing down the hallway prior to our encounter.  Spasms will hit him that we will take him to his knees where he cannot stop coughing and almost passes out.  The cough is nonproductive.  There is no hemoptysis.  He has lost weight. Wt Readings from Last 3 Encounters:  01/11/20 220 lb (99.8 kg)  12/09/19 223 lb (101.2 kg)  09/10/19 239 lb (108.4 kg)   He has not been checking his sugars frequently but he states his blood sugars are typically in the 200 range and not significant enough he would anticipate to cause him to lose weight.  He also reports occasional night sweats but no documented true fevers.  He states that his temperatures typically 99 at the highest.  He does have occasional wheezing and has tried albuterol however that has not helped the cough.  He has had 2 - Covid test the most recent at the end of November.  Both were negative.  He does now have pleurisy and chest pain simply due to the coughing.  Coughing is made worse by sitting up and leaning forward and alleviated by lying backwards Past Medical History:  Diagnosis Date  . De Quervain's tenosynovitis, left 03/2011  . Diabetes mellitus    NIDDM  . Hyperlipidemia   . Hypertension    under control; has been on med. x 4 yrs.  . Nonproliferative retinopathy due to secondary diabetes [facility])    Past Surgical History:  Procedure Laterality Date  . CARPAL TUNNEL RELEASE     bilat.  . DORSAL COMPARTMENT RELEASE  03/14/2011   Procedure: RELEASE DORSAL COMPARTMENT (DEQUERVAIN);  Surgeon: Paulene Floor, MD;  Location: [place];  Service: Orthopedics;  Laterality: Left;  left wrist APL and  EPL tenosynovectomy and 1st dorsal compartment release  . TONSILLECTOMY AND ADENOIDECTOMY  as a child  . VASECTOMY     Current Outpatient Medications on File Prior to Visit  Medication Sig Dispense Refill  . aspirin 81 MG tablet Take 81 mg by mouth daily.    [name] atorvastatin (LIPITOR) 40 MG tablet TAKE 1 TABLET BY MOUTH DAILY. 30 tablet 5  . benzonatate (TESSALON) 100 MG capsule Take 1-2 capsules (100-200 mg total) by mouth 3 (three) times daily as needed for cough. 40 capsule 0  . bisoprolol-hydrochlorothiazide (ZIAC) 10-6.25 MG tablet TAKE 1 TABLET BY MOUTH EVERY DAY IN THE MORNING 90 tablet 3  . blood glucose meter kit and supplies KIT Fasting prior to each meal three times a day 1 each 0  . Choline Fenofibrate (FENOFIBRIC ACID) 135 MG CPDR TAKE 1 CAPSULE BY MOUTH EVERY DAY IN THE EVENING 30 capsule 5  . gabapentin (NEURONTIN) 300 MG capsule Take 1 capsule (300 mg total) by mouth 3 (three) times daily. 90 capsule 3  . glipiZIDE (GLUCOTROL XL) 10 MG 24 hr tablet TAKE 1 TABLET (10 MG TOTAL) BY MOUTH DAILY. 30 tablet 5  . glucose blood test strip Dispense based on patient and insurance preference.  Use twice daily as directed. (FOR ICD-10 E11.65) 100 each 11  . insulin regular (NOVOLIN R) 100  units/mL injection Check fasting blood sugar just prior to eating breakfast, lunch, and dinner Based on your blood sugar take insulin as directed: BG 150-200 take 2 units BG 201-250 take 4 units BG 251-300 take 6 units BG 301-350 take 8 units BG 351-400 take 10 units BG >401 take 12 units and call doctor 10 mL 0  . Lancets Thin MISC Check BS BID 100 each 5  . metFORMIN (GLUCOPHAGE-XR) 500 MG 24 hr tablet TAKE 4 TABLETS BY MOUTH ONCE DAILY WITH BREAKFAST 120 tablet 5  . TRUEPLUS LANCETS 33G MISC 1 each by Does not apply route 2 (two) times daily. 100 each 11  . TRULICITY 1.5 KG/4.0NU SOPN INJECT 1.5 MG SUBCUTANEOUSLY INTO THE SKIN ONCE A WEEK 6 pen 3  . JANUVIA 100 MG tablet TAKE 1 TABLET (100 MG TOTAL) BY  MOUTH DAILY. (Patient not taking: Reported on 12/09/2019) 30 tablet 5   No current facility-administered medications on file prior to visit.   Allergies  Allergen Reactions  . Niaspan [Niacin] Rash   Social History   Socioeconomic History  . Marital status: Married    Spouse name: Not on file  . Number of children: Not on file  . Years of education: Not on file  . Highest education level: Not on file  Occupational History  . Not on file  Tobacco Use  . Smoking status: Former Smoker    Packs/day: 3.00    Years: 6.00    Pack years: 18.00    Types: Cigarettes    Quit date: 02/05/1999    Years since quitting: 20.9  . Smokeless tobacco: Never Used  Vaping Use  . Vaping Use: Never used  Substance and Sexual Activity  . Alcohol use: No    Alcohol/week: 0.0 standard drinks  . Drug use: No  . Sexual activity: Not on file  Other Topics Concern  . Not on file  Social History Narrative  . Not on file   Social Determinants of Health   Financial Resource Strain:   . Difficulty of Paying Living Expenses: Not on file  Food Insecurity:   . Worried About Charity fundraiser in the Last Year: Not on file  . Ran Out of Food in the Last Year: Not on file  Transportation Needs:   . Lack of Transportation (Medical): Not on file  . Lack of Transportation (Non-Medical): Not on file  Physical Activity:   . Days of Exercise per Week: Not on file  . Minutes of Exercise per Session: Not on file  Stress:   . Feeling of Stress : Not on file  Social Connections:   . Frequency of Communication with Friends and Family: Not on file  . Frequency of Social Gatherings with Friends and Family: Not on file  . Attends Religious Services: Not on file  . Active Member of Clubs or Organizations: Not on file  . Attends Archivist Meetings: Not on file  . Marital Status: Not on file  Intimate Partner Violence:   . Fear of Current or Ex-Partner: Not on file  . Emotionally Abused: Not on file   . Physically Abused: Not on file  . Sexually Abused: Not on file      Review of Systems  All other systems reviewed and are negative.      Objective:   Physical Exam Vitals reviewed.  Constitutional:      General: He is not in acute distress.    Appearance: Normal appearance. He is not  ill-appearing, toxic-appearing or diaphoretic.  HENT:     Right Ear: Tympanic membrane and ear canal normal.     Left Ear: Tympanic membrane and ear canal normal.     Nose: Nose normal. No congestion or rhinorrhea.     Mouth/Throat:     Mouth: Mucous membranes are moist.     Pharynx: Oropharynx is clear. No oropharyngeal exudate or posterior oropharyngeal erythema.  Eyes:     General:        Right eye: No discharge.        Left eye: No discharge.     Conjunctiva/sclera: Conjunctivae normal.  Cardiovascular:     Rate and Rhythm: Normal rate and regular rhythm.     Pulses: Normal pulses.     Heart sounds: Normal heart sounds. No murmur heard.   Pulmonary:     Effort: Pulmonary effort is normal. No respiratory distress.     Breath sounds: Normal breath sounds. No stridor. No wheezing, rhonchi or rales.  Lymphadenopathy:     Cervical: No cervical adenopathy.  Skin:    Findings: No ecchymosis, erythema, lesion, petechiae or rash.  Neurological:     Mental Status: He is alert.           Assessment & Plan:  Cough - Plan: COVID19 and Influenza A & B, CBC with Differential/Platelet, COMPLETE METABOLIC PANEL WITH GFR, Hemoglobin A1c, SARS CoV2 Serology(COVID19) AB(IgG,IgM),Immunoassay, QuantiFERON-TB Gold Plus, D-dimer, quantitative (not at [facility]), Bordetella pertussis toxin (PT) AB ( IgG, IgA) , MAID  Chronic cough - Plan: CBC with Differential/Platelet, COMPLETE METABOLIC PANEL WITH GFR, Hemoglobin A1c, SARS CoV2 Serology(COVID19) AB(IgG,IgM),Immunoassay, QuantiFERON-TB Gold Plus, D-dimer, quantitative (not at [facility]), Bordetella pertussis toxin (PT) AB ( IgG, IgA) , MAID, traMADol (ULTRAM)  50 MG tablet  Patient is having spasms of cough.  I will repeat a Covid test along with a flu test today.  I will also check Covid serology to see if you may have recently had Covid and may have long-haul syndrome.  However now the differential diagnosis would be whooping cough.  Therefore I will check pertussis antibody IgG and IgA.  I will also check a QuantiFERON gold to screen for tuberculosis.  Check a D-dimer to screen for pulmonary embolism.  Check an A1c given the weight loss.  If lab work is negative, I think the patient may require either a CAT scan or bronc to evaluate for possible endobronchial lesion triggering the cough.  There also may be nerve irritation in the throat triggering the cough so I will start him on tramadol 50 mg every 6 hours to try to lessen the spasms of coughing.

## 2020-01-13 ENCOUNTER — Other Ambulatory Visit: Payer: Self-pay | Admitting: Family Medicine

## 2020-01-13 ENCOUNTER — Encounter: Payer: Self-pay | Admitting: Family Medicine

## 2020-01-13 ENCOUNTER — Other Ambulatory Visit: Payer: Self-pay

## 2020-01-13 ENCOUNTER — Other Ambulatory Visit: Payer: Self-pay | Admitting: *Deleted

## 2020-01-13 ENCOUNTER — Ambulatory Visit (HOSPITAL_COMMUNITY)
Admission: RE | Admit: 2020-01-13 | Discharge: 2020-01-13 | Disposition: A | Discharge status: Home / Self Care | Payer: No Typology Code available for payment source | Source: Ambulatory Visit | Attending: Family Medicine | Admitting: Family Medicine

## 2020-01-13 DIAGNOSIS — R634 Abnormal weight loss: Secondary | ICD-10-CM | POA: Insufficient documentation

## 2020-01-13 DIAGNOSIS — R7989 Other specified abnormal findings of blood chemistry: Secondary | ICD-10-CM | POA: Diagnosis present

## 2020-01-13 DIAGNOSIS — R059 Cough, unspecified: Secondary | ICD-10-CM | POA: Diagnosis present

## 2020-01-13 MED ORDER — HYDROCOD POLST-CPM POLST ER 10-8 MG/5ML PO SUER: 5.0000 mL | Freq: Two times a day (BID) | ORAL | 0 refills | Status: DC | PRN

## 2020-01-13 MED ORDER — IOHEXOL 300 MG/ML  SOLN
100.0000 mL | Freq: Once | INTRAMUSCULAR | Status: AC | PRN
  Administered 2020-01-13: 100 mL via INTRAVENOUS

## 2020-01-14 ENCOUNTER — Other Ambulatory Visit: Payer: Self-pay | Admitting: Family Medicine

## 2020-01-14 ENCOUNTER — Other Ambulatory Visit: Payer: Self-pay

## 2020-01-14 ENCOUNTER — Encounter (HOSPITAL_COMMUNITY): Payer: Self-pay | Admitting: Radiology

## 2020-01-14 ENCOUNTER — Other Ambulatory Visit: Payer: Self-pay | Admitting: *Deleted

## 2020-01-14 ENCOUNTER — Encounter: Payer: Self-pay | Admitting: Family Medicine

## 2020-01-14 ENCOUNTER — Inpatient Hospital Stay: Payer: No Typology Code available for payment source

## 2020-01-14 ENCOUNTER — Inpatient Hospital Stay: Payer: No Typology Code available for payment source | Attending: Oncology | Admitting: Oncology

## 2020-01-14 VITALS — BP 141/85 | HR 72 | Temp 95.5°F | Resp 18 | Ht 74.0 in | Wt 211.4 lb

## 2020-01-14 DIAGNOSIS — Z803 Family history of malignant neoplasm of breast: Secondary | ICD-10-CM | POA: Insufficient documentation

## 2020-01-14 DIAGNOSIS — Z23 Encounter for immunization: Secondary | ICD-10-CM | POA: Insufficient documentation

## 2020-01-14 DIAGNOSIS — I10 Essential (primary) hypertension: Secondary | ICD-10-CM | POA: Diagnosis not present

## 2020-01-14 DIAGNOSIS — Z807 Family history of other malignant neoplasms of lymphoid, hematopoietic and related tissues: Secondary | ICD-10-CM | POA: Insufficient documentation

## 2020-01-14 DIAGNOSIS — E1142 Type 2 diabetes mellitus with diabetic polyneuropathy: Secondary | ICD-10-CM | POA: Insufficient documentation

## 2020-01-14 DIAGNOSIS — R059 Cough, unspecified: Secondary | ICD-10-CM | POA: Diagnosis not present

## 2020-01-14 DIAGNOSIS — G893 Neoplasm related pain (acute) (chronic): Secondary | ICD-10-CM | POA: Diagnosis not present

## 2020-01-14 DIAGNOSIS — C221 Intrahepatic bile duct carcinoma: Secondary | ICD-10-CM | POA: Insufficient documentation

## 2020-01-14 DIAGNOSIS — C772 Secondary and unspecified malignant neoplasm of intra-abdominal lymph nodes: Secondary | ICD-10-CM | POA: Insufficient documentation

## 2020-01-14 DIAGNOSIS — K8689 Other specified diseases of pancreas: Secondary | ICD-10-CM | POA: Diagnosis not present

## 2020-01-14 DIAGNOSIS — E785 Hyperlipidemia, unspecified: Secondary | ICD-10-CM | POA: Diagnosis not present

## 2020-01-14 DIAGNOSIS — Z5111 Encounter for antineoplastic chemotherapy: Secondary | ICD-10-CM | POA: Insufficient documentation

## 2020-01-14 DIAGNOSIS — C259 Malignant neoplasm of pancreas, unspecified: Secondary | ICD-10-CM

## 2020-01-14 LAB — CBC WITH DIFFERENTIAL/PLATELET
Absolute Monocytes: 1074 cells/uL — ABNORMAL HIGH (ref 200–950)
Basophils Absolute: 113 cells/uL (ref 0–200)
Basophils Relative: 1 %
Eosinophils Absolute: 1062 cells/uL — ABNORMAL HIGH (ref 15–500)
Eosinophils Relative: 9.4 %
HCT: 46.4 % (ref 38.5–50.0)
Hemoglobin: 15.7 g/dL (ref 13.2–17.1)
Lymphs Abs: 1548 cells/uL (ref 850–3900)
MCH: 30.1 pg (ref 27.0–33.0)
MCHC: 33.8 g/dL (ref 32.0–36.0)
MCV: 89.1 fL (ref 80.0–100.0)
MPV: 10.2 fL (ref 7.5–12.5)
Monocytes Relative: 9.5 %
Neutro Abs: 7503 cells/uL (ref 1500–7800)
Neutrophils Relative %: 66.4 %
Platelets: 390 10*3/uL (ref 140–400)
RBC: 5.21 10*6/uL (ref 4.20–5.80)
RDW: 11.4 % (ref 11.0–15.0)
Total Lymphocyte: 13.7 %
WBC: 11.3 10*3/uL — ABNORMAL HIGH (ref 3.8–10.8)

## 2020-01-14 LAB — LACTATE DEHYDROGENASE: LDH: 147 U/L (ref 98–192)

## 2020-01-14 LAB — CMP (CANCER CENTER ONLY)
ALT: 47 U/L — ABNORMAL HIGH (ref 0–44)
AST: 41 U/L (ref 15–41)
Albumin: 3.3 g/dL — ABNORMAL LOW (ref 3.5–5.0)
Alkaline Phosphatase: 393 U/L — ABNORMAL HIGH (ref 38–126)
Anion gap: 10 (ref 5–15)
BUN: 10 mg/dL (ref 6–20)
CO2: 30 mmol/L (ref 22–32)
Calcium: 11 mg/dL — ABNORMAL HIGH (ref 8.9–10.3)
Chloride: 95 mmol/L — ABNORMAL LOW (ref 98–111)
Creatinine: 1.06 mg/dL (ref 0.61–1.24)
GFR, Estimated: >60 mL/min (ref >60)
Glucose, Bld: 246 mg/dL — ABNORMAL HIGH (ref 70–99)
Potassium: 4.3 mmol/L (ref 3.5–5.1)
Sodium: 135 mmol/L (ref 135–145)
Total Bilirubin: 0.9 mg/dL (ref 0.3–1.2)
Total Protein: 8.2 g/dL — ABNORMAL HIGH (ref 6.5–8.1)

## 2020-01-14 LAB — D-DIMER, QUANTITATIVE: D-Dimer, Quant: 0.88 mcg/mL FEU — ABNORMAL HIGH (ref <0.50)

## 2020-01-14 LAB — BORDETELLA PERTUSSIS TOXIN (PT) AB ( IGG, IGA) , MAID
Bordetella pertussis toxin (PT) Ab (IgA): 2 IU/mL
Bordetella pertussis toxin (PT) Ab (IgG): <1 IU/mL

## 2020-01-14 LAB — COMPLETE METABOLIC PANEL WITH GFR
AG Ratio: 1.1 (calc) (ref 1.0–2.5)
ALT: 51 U/L — ABNORMAL HIGH (ref 9–46)
AST: 47 U/L — ABNORMAL HIGH (ref 10–40)
Albumin: 3.8 g/dL (ref 3.6–5.1)
Alkaline phosphatase (APISO): 409 U/L — ABNORMAL HIGH (ref 36–130)
BUN: 17 mg/dL (ref 7–25)
CO2: 26 mmol/L (ref 20–32)
Calcium: 10.3 mg/dL (ref 8.6–10.3)
Chloride: 92 mmol/L — ABNORMAL LOW (ref 98–110)
Creat: 0.78 mg/dL (ref 0.60–1.35)
GFR, Est African American: 127 mL/min/{1.73_m2} (ref >=60)
GFR, Est Non African American: 110 mL/min/{1.73_m2} (ref >=60)
Globulin: 3.6 g/dL (calc) (ref 1.9–3.7)
Glucose, Bld: 370 mg/dL — ABNORMAL HIGH (ref 65–99)
Potassium: 4.7 mmol/L (ref 3.5–5.3)
Sodium: 128 mmol/L — ABNORMAL LOW (ref 135–146)
Total Bilirubin: 0.9 mg/dL (ref 0.2–1.2)
Total Protein: 7.4 g/dL (ref 6.1–8.1)

## 2020-01-14 LAB — HEMOGLOBIN A1C
Hgb A1c MFr Bld: 9.8 % of total Hgb — ABNORMAL HIGH (ref <5.7)
Mean Plasma Glucose: 235 mg/dL
eAG (mmol/L): 13 mmol/L

## 2020-01-14 LAB — CEA (IN HOUSE-CHCC): CEA (CHCC-In House): 2.15 ng/mL (ref 0.00–5.00)

## 2020-01-14 LAB — SARS-COVID-2 RNA(COVID19)AND INFLUENZA A&B, QUALITATIVE NAAT
FLU A: NOT DETECTED
FLU B: NOT DETECTED
SARS CoV2 RNA: NOT DETECTED

## 2020-01-14 LAB — QUANTIFERON-TB GOLD PLUS
Mitogen-NIL: 1.39 IU/mL
NIL: 0.03 IU/mL
QuantiFERON-TB Gold Plus: NEGATIVE
TB1-NIL: 0 IU/mL
TB2-NIL: 0 IU/mL

## 2020-01-14 LAB — SARS COV-2 SEROLOGY(COVID-19)AB(IGG,IGM),IMMUNOASSAY
SARS CoV-2 AB IgG: NEGATIVE
SARS CoV-2 IgM: NEGATIVE

## 2020-01-14 LAB — PROTIME-INR
INR: 1 (ref 0.8–1.2)
Prothrombin Time: 12.9 seconds (ref 11.4–15.2)

## 2020-01-14 MED ORDER — SODIUM CHLORIDE 0.9 % IV SOLN
INTRAVENOUS | Status: AC
  Filled 2020-01-14 (×2): qty 250

## 2020-01-14 MED ORDER — ZOLEDRONIC ACID 4 MG/100ML IV SOLN
INTRAVENOUS | Status: AC
  Filled 2020-01-14: qty 100

## 2020-01-14 MED ORDER — ZOLEDRONIC ACID 4 MG/100ML IV SOLN
4.0000 mg | Freq: Once | INTRAVENOUS | Status: AC
  Administered 2020-01-14: 4 mg via INTRAVENOUS

## 2020-01-14 NOTE — Progress Notes (Signed)
Called wife with calcium results. They are still in [place]. Will get something to eat and bring it back here for the fluids and Zometa

## 2020-01-14 NOTE — Progress Notes (Signed)
Wife/patient made aware of biopsy appointment change to 01/19/20 at 1 pm--check in at radiology at [facility] at 11:15. NPO after 0700

## 2020-01-14 NOTE — Progress Notes (Signed)
Prior auth pending for Zometa. Dx: Hypercalcemia. Ok to proceed with Zometa today per Chartered certified accountant.  Hardie Pulley, PharmD, BCPS, BCOP

## 2020-01-14 NOTE — Patient Instructions (Signed)
Push oral fluids and follow up as scheduled.

## 2020-01-14 NOTE — Progress Notes (Signed)
   Covid-19 Vaccination Clinic  Name:  ABDULKADIR EMMANUEL    MRN: [id number] DOB: 1975-06-04  01/14/2020  Matthew Osborne was observed post Covid-19 immunization for 15 minutes without incident. He was provided with Vaccine Information Sheet and instruction to access the V-Safe system.   Matthew Osborne was instructed to call 911 with any severe reactions post vaccine: [name] Difficulty breathing  . Swelling of face and throat  . A fast heartbeat  . A bad rash all over body  . Dizziness and weakness   Immunizations Administered    Name Date Dose VIS Date Route   Pfizer COVID-19 Vaccine 01/14/2020  1:05 PM 0.3 mL 11/24/2019 Intramuscular   Manufacturer: [place]   Lot: [id number]   [place]: [id number]

## 2020-01-14 NOTE — Progress Notes (Signed)
Will await reaching out to [place] and dietician when final diagnosis is known and treatment plan established.

## 2020-01-14 NOTE — Progress Notes (Signed)
[facility] New Patient Consult   Requesting MD: Susy Frizzle, [place] [address] [place],  [place] [address]   Matthew Osborne 44 y.o.  [birth year]    Reason for Consult: Liver lesions, possible pancreas mass   HPI: Matthew Osborne reports a cough beginning in the summer of this year.  The cough is worse with exertion and is now more consistent.  He reports undergoing negative pulmonary and cardiology evaluations.  He reports almost passing out during a coughing episode while undergoing a cardiac stress test on 12/29/2019.  He has lost approximately 100 pounds over the past year, but has developed anorexia and increased weight loss over the past several months.  He saw Dr. Dennard Schaumann on 01/11/2020.  He was referred for CTs of the chest, abdomen, and pelvis yesterday.  The lungs are clear.  No pleural effusion.  No chest lymphadenopathy.  Multiple ring-enhancing masses in the liver.  There is segmental dilatation of the left lobe Intermatic bile ducts.  Ill-defined hypodensity in the central liver with effacement of the portal vein.  A rounded hypodense mass at the pancreas neck measures 4.4 x 3.4 cm.  No pancreatic ductal dilatation.  Enlarged portacaval lymph nodes and retroperitoneal lymph nodes.  No ascites.  He was prescribed tramadol for the cough.  Tramadol helps the cough.   Past Medical History:  Diagnosis Date  . De Quervain's tenosynovitis, left 03/2011  . Diabetes mellitus    NIDDM  . Hyperlipidemia   . Hypertension    under control; has been on med. x 4 yrs.  . Nonproliferative retinopathy due to secondary diabetes [facility])     Past Surgical History:  Procedure Laterality Date  . CARPAL TUNNEL RELEASE     bilat.  . DORSAL COMPARTMENT RELEASE  03/14/2011   Procedure: RELEASE DORSAL COMPARTMENT (DEQUERVAIN);  Surgeon: Paulene Floor, MD;  Location: [place];  Service: Orthopedics;  Laterality: Left;  left wrist APL and EPL tenosynovectomy  and 1st dorsal compartment release  . TONSILLECTOMY AND ADENOIDECTOMY  as a child  . VASECTOMY      Medications: Reviewed  Allergies:  Allergies  Allergen Reactions  . Niaspan [Niacin] Rash    Family history: His mother had lymphoma.  His maternal grandfather had mesothelioma.  His maternal grandmother had breast cancer.  Social History:   He lives with his wife and children in [place].  He works in an [employer] with his family.  He previously worked as a Engineer, structural.  He quit smoking cigarettes in 2000.  Rare alcohol use.  No risk factor for HIV or hepatitis.  ROS:   Positives include: 35 pound weight loss, anorexia, 2 episodes of low-grade fever at night (100-1 01), night sweats on 4 occasions over the past few months, right shoulder discomfort, cough that is worse with exertion, multiple bee stings causing hives and pruritus in August 2021  A complete ROS was otherwise negative.  Physical Exam:  Blood pressure (!) 141/85, pulse 72, temperature (!) 95.5 F (35.3 C), temperature source Tympanic, resp. rate 18, height 6\' 2"  (1.88 m), weight 211 lb 6.4 oz (95.9 kg), SpO2 97 %.  HEENT: Neck without mass Lungs: Clear bilaterally Cardiac: Regular rate and rhythm Abdomen: No hepatosplenomegaly, no mass, nontender GU: Testes without mass Vascular: No leg edema Lymph nodes: No cervical, supraclavicular, axillary, or inguinal nodes Neurologic: Alert and oriented, the motor exam appears intact in the upper and lower extremities bilaterally Skin:  No rash Musculoskeletal: No spine tenderness   LAB:  CBC  Lab Results  Component Value Date   WBC 11.3 (H) 01/11/2020   HGB 15.7 01/11/2020   HCT 46.4 01/11/2020   MCV 89.1 01/11/2020   PLT 390 01/11/2020   NEUTROABS 7,503 01/11/2020        CMP  Lab Results  Component Value Date   NA 135 01/14/2020   K 4.3 01/14/2020   CL 95 (L) 01/14/2020   CO2 30 01/14/2020   GLUCOSE 246 (H) 01/14/2020   BUN 10  01/14/2020   CREATININE 1.06 01/14/2020   CALCIUM 11.0 (H) 01/14/2020   PROT 8.2 (H) 01/14/2020   ALBUMIN 3.3 (L) 01/14/2020   AST 41 01/14/2020   ALT 47 (H) 01/14/2020   ALKPHOS 393 (H) 01/14/2020   BILITOT 0.9 01/14/2020   GFRNONAA >60 01/14/2020   GFRAA 127 01/11/2020     Lab Results  Component Value Date   CEA1 2.15 01/14/2020    Imaging:  CT Chest W Contrast  Result Date: 01/13/2020 CLINICAL DATA:  Unintended weight loss, elevated LFT EXAM: CT CHEST, ABDOMEN, AND PELVIS WITH CONTRAST TECHNIQUE: Multidetector CT imaging of the chest, abdomen and pelvis was performed following the standard protocol during bolus administration of intravenous contrast. CONTRAST:  184mL OMNIPAQUE IOHEXOL 300 MG/ML  SOLN COMPARISON:  CT chest angiogram, 01/18/2013 FINDINGS: CT CHEST FINDINGS Cardiovascular: Left and right coronary artery calcifications and/or stents. Normal heart size. No pericardial effusion. Mediastinum/Nodes: No enlarged mediastinal, hilar, or axillary lymph nodes. Thyroid gland, trachea, and esophagus demonstrate no significant findings. Lungs/Pleura: Lungs are clear. No pleural effusion or pneumothorax. Musculoskeletal: No chest wall mass or suspicious bone lesions identified. CT ABDOMEN PELVIS FINDINGS Hepatobiliary: Multiple rim enhancing masses of the liver, primarily in the left lobe, with atrophy of the left lobe and segmental dilatation of the left lobe intrahepatic bile ducts (series 4, image 16). The largest index mass of the anterior left lobe of the liver measures 3.4 x 2.9 cm (series 4, image 21). There is very ill-defined hypodensity of the central liver with effacement of the left portal vein (series 4, image 17). No gallstones, gallbladder wall thickening, or biliary dilatation. Pancreas: There is a rounded, hypodense mass of the pancreatic neck measuring 4.4 x 3.4 cm no pancreatic ductal dilatation or surrounding inflammatory changes. Spleen: Normal in size without  significant abnormality. Adrenals/Urinary Tract: Adrenal glands are unremarkable. Kidneys are normal, without renal calculi, solid lesion, or hydronephrosis. Bladder is unremarkable. Stomach/Bowel: Stomach is within normal limits. Appendix appears normal. No evidence of bowel wall thickening, distention, or inflammatory changes. Vascular/Lymphatic: No significant vascular findings are present. Enlarged portacaval lymph nodes measuring up to 4.5 x 3.0 cm (series 4, image 24). Numerous enlarged retroperitoneal lymph nodes measuring up to 1.9 x 1.1 cm (series 4, image 34). Reproductive: No mass or other abnormality. Other: No abdominal wall hernia or abnormality. No abdominopelvic ascites. Musculoskeletal: No acute or significant osseous findings. IMPRESSION: 1. There is a rounded, hypodense mass which appears to arise from the pancreatic neck measuring 4.4 x 3.4 cm. 2. Multiple rim enhancing masses of the liver, primarily in the left lobe, with atrophy of the left lobe and segmental dilatation of the left lobe intrahepatic bile ducts. 3. There is very ill-defined hypodensity of the central liver with effacement of the left portal vein. Findings are consistent with metastatic disease. 4. Numerous enlarged portacaval and retroperitoneal lymph nodes. 5. Constellation of findings above most likely reflects primary pancreatic adenocarcinoma with hepatic  and nodal metastatic disease, although ill-defined hypodensity of the central liver and portal vein effacement with biliary obstruction raises a significant differential consideration of cholangiocarcinoma and associated metastatic disease. Suspected primary mass of the pancreatic neck may therefore reflect a nodal metastasis which does not truly arise from the pancreatic parenchyma. Multiphasic contrast enhanced MRI may be helpful to better assess and characterize disease. 6. No evidence of distant metastatic disease in the chest. 7. Coronary artery disease. These results  will be called to the ordering clinician or representative by the Radiologist Assistant, and communication documented in the PACS or [employer]. Electronically Signed   By: Eddie Candle M.D.   On: 01/13/2020 16:40   CT Abdomen Pelvis W Contrast  Result Date: 01/13/2020 CLINICAL DATA:  Unintended weight loss, elevated LFT EXAM: CT CHEST, ABDOMEN, AND PELVIS WITH CONTRAST TECHNIQUE: Multidetector CT imaging of the chest, abdomen and pelvis was performed following the standard protocol during bolus administration of intravenous contrast. CONTRAST:  132mL OMNIPAQUE IOHEXOL 300 MG/ML  SOLN COMPARISON:  CT chest angiogram, 01/18/2013 FINDINGS: CT CHEST FINDINGS Cardiovascular: Left and right coronary artery calcifications and/or stents. Normal heart size. No pericardial effusion. Mediastinum/Nodes: No enlarged mediastinal, hilar, or axillary lymph nodes. Thyroid gland, trachea, and esophagus demonstrate no significant findings. Lungs/Pleura: Lungs are clear. No pleural effusion or pneumothorax. Musculoskeletal: No chest wall mass or suspicious bone lesions identified. CT ABDOMEN PELVIS FINDINGS Hepatobiliary: Multiple rim enhancing masses of the liver, primarily in the left lobe, with atrophy of the left lobe and segmental dilatation of the left lobe intrahepatic bile ducts (series 4, image 16). The largest index mass of the anterior left lobe of the liver measures 3.4 x 2.9 cm (series 4, image 21). There is very ill-defined hypodensity of the central liver with effacement of the left portal vein (series 4, image 17). No gallstones, gallbladder wall thickening, or biliary dilatation. Pancreas: There is a rounded, hypodense mass of the pancreatic neck measuring 4.4 x 3.4 cm no pancreatic ductal dilatation or surrounding inflammatory changes. Spleen: Normal in size without significant abnormality. Adrenals/Urinary Tract: Adrenal glands are unremarkable. Kidneys are normal, without renal calculi, solid lesion, or  hydronephrosis. Bladder is unremarkable. Stomach/Bowel: Stomach is within normal limits. Appendix appears normal. No evidence of bowel wall thickening, distention, or inflammatory changes. Vascular/Lymphatic: No significant vascular findings are present. Enlarged portacaval lymph nodes measuring up to 4.5 x 3.0 cm (series 4, image 24). Numerous enlarged retroperitoneal lymph nodes measuring up to 1.9 x 1.1 cm (series 4, image 34). Reproductive: No mass or other abnormality. Other: No abdominal wall hernia or abnormality. No abdominopelvic ascites. Musculoskeletal: No acute or significant osseous findings. IMPRESSION: 1. There is a rounded, hypodense mass which appears to arise from the pancreatic neck measuring 4.4 x 3.4 cm. 2. Multiple rim enhancing masses of the liver, primarily in the left lobe, with atrophy of the left lobe and segmental dilatation of the left lobe intrahepatic bile ducts. 3. There is very ill-defined hypodensity of the central liver with effacement of the left portal vein. Findings are consistent with metastatic disease. 4. Numerous enlarged portacaval and retroperitoneal lymph nodes. 5. Constellation of findings above most likely reflects primary pancreatic adenocarcinoma with hepatic and nodal metastatic disease, although ill-defined hypodensity of the central liver and portal vein effacement with biliary obstruction raises a significant differential consideration of cholangiocarcinoma and associated metastatic disease. Suspected primary mass of the pancreatic neck may therefore reflect a nodal metastasis which does not truly arise from the pancreatic parenchyma. Multiphasic  contrast enhanced MRI may be helpful to better assess and characterize disease. 6. No evidence of distant metastatic disease in the chest. 7. Coronary artery disease. These results will be called to the ordering clinician or representative by the Radiologist Assistant, and communication documented in the PACS or [employer]. Electronically Signed   By: Eddie Candle M.D.   On: 01/13/2020 16:40    CT images reviewed with Matthew Osborne and his wife, no evidence of pulmonary embolism for review with an interventional radiologist  Assessment/Plan:   1. Multiple liver masses, probable pancreas neck mass  CTs 01/13/2020-rounded hypodense mass appears to arise from the pancreas neck, multiple rim-enhancing masses in the liver, primarily left liver with segmental dilation of the left lobe Intermatic bile ducts, ill-defined hypodensity the central liver with effacement of the left portal vein, enlarged portacaval and retroperitoneal lymph nodes  2. Cough-likely related to diaphragmatic irritation from #1 3. Anorexia/weight loss 4. Hypercalcemia-likely hypercalcemia malignancy, status post intravenous hydration and Zometa 01/14/2020 5. Diabetes 6. Hyperlipidemia 7. Hypertension   Disposition:   Matthew Osborne has a history of intermittent cough for approximately 6 months.  This is associated with anorexia/weight loss and a report of intermittent low-grade fever and night sweats.  CTs on 01/13/2020 are consistent with a malignancy involving multiple liver lesions, abdominal/retroperitoneal lymph nodes, and a possible pancreas neck mass.  I reviewed the CT images with an interventional radiologist today.  It is unclear whether the mass near the pancreas head is representative of a lymph node metastasis or a pancreas mass.  There is no pancreas duct dilatation or pancreas atrophy.  I discussed the differential diagnosis with Matthew Osborne and his wife.  He understands the differential diagnosis includes a malignancy including pancreas cancer, cholangiocarcinoma, and other primary tumor sites.  We also discussed the possibility of an infection or granulomatous disease.  We obtained a chemistry panel today and the calcium returned elevated.  He received intravenous hydration and Zometa at the Cancer center today.  We also  check an LDH, CEA, and CA 19-9.  He will be scheduled for an ultrasound-guided biopsy of a liver lesion on 01/19/2020.  He will return for an office visit and further discussion on 01/21/2020.  Review of the clear explanation for the cough.  I suspect the cough is related to diaphragmatic irritation from tumor.  Betsy Coder, MD  01/14/2020, 3:45 PM

## 2020-01-14 NOTE — Progress Notes (Signed)
Amiere A. Albrecht Male, 44 y.o., 12/09/1975  MRN:  [id number] Phone:  [phone] Jerilynn Mages)       PCP:  Susy Frizzle, MD Coverage:  [employer] [place] [employer]  Next Appt With Oncology 01/21/2020 at 2:30 PM           RE: US Liver Biopsy Received: Today Arne Cleveland, MD  [place], Gregorey Nabor D  Ok   [place] core liver lesion   DDH        Previous Messages   ----- Message -----  From: Garth Bigness D  Sent: 01/14/2020 12:03 PM EST  To: [facility] Procedure Requests  Subject: US Liver Biopsy                  Procedure:  US Liver Biopsy   Reason: Mass of pancreas, liver lesions, possible pancreas mass   History: CT in computer   Provider: Ladell Pier   Provider Contact: [phone]

## 2020-01-15 LAB — CANCER ANTIGEN 19-9: CA 19-9: 66 U/mL — ABNORMAL HIGH (ref 0–35)

## 2020-01-17 ENCOUNTER — Encounter: Payer: Self-pay | Admitting: General Practice

## 2020-01-17 ENCOUNTER — Other Ambulatory Visit: Payer: Self-pay | Admitting: Radiology

## 2020-01-17 ENCOUNTER — Other Ambulatory Visit: Payer: Self-pay | Admitting: Family Medicine

## 2020-01-17 NOTE — Progress Notes (Signed)
[facility] Spiritual Care Note  Left voicemail introducing support services, encouraging callback.   [place], [place], [facility] Pager [phone] Voicemail [phone]

## 2020-01-18 ENCOUNTER — Encounter: Payer: Self-pay | Admitting: General Practice

## 2020-01-18 NOTE — Progress Notes (Signed)
[place] Work  Initial Assessment   Matthew Osborne is a 44 y.o. year old male contacted by phone. Clinical Social Work was referred by  for assessment of psychosocial needs.   SDOH (Social Determinants of Health) assessments performed: Yes SDOH Interventions   Flowsheet Row Most Recent Value  SDOH Interventions   Food Insecurity Interventions Intervention Not Indicated  Financial Strain Interventions Intervention Not Indicated  Housing Interventions Intervention Not Indicated  Transportation Interventions Intervention Not Indicated      Distress Screen completed: Yes    Clinical Social Work was referred by distress screening protocol.  The patient scored a 5 on the Psychosocial Distress Thermometer which indicates Moderate distress.   01/14/2020   Initial Screening  Distress experienced in past week (1-10) 5  Emotional problem type Adjusting to illness  Spiritual/Religous concerns type Facing my mortality  Information Concerns Type Lack of info about diagnosis  Physical Problem type Other (comment)  Physician notified of physical symptoms Yes  Referral to clinical psychology No  Referral to clinical social work No  Referral to dietition No  Referral to financial advocate No  Referral to support programs No  Referral to palliative care No   ONCBCN DISTRESS SCREENING 01/14/2020  Screening Type Initial Screening  Distress experienced in past week (1-10) 5  Emotional problem type Adjusting to illness  Spiritual/Religous concerns type Facing my mortality  Information Concerns Type Lack of info about diagnosis  Physical Problem type Other (comment)  Physician notified of physical symptoms Yes  Referral to clinical psychology No  Referral to clinical social work No  Referral to dietition No  Referral to financial advocate No  Referral to support programs No  Referral to palliative care No      Family/Social Information:  . Housing Arrangement: patient lives  with wife . Family members/support persons in your life? Wife and church family . Transportation concerns: none . Employment: Unemployed, not working due to current diagnosis .  Income source:spouses income . Financial concerns: No, not at this time o Type of concern: None . Food access concerns: none . Religious or spiritual practice: Strong connection to his faith community, relies on prayer and connections with others in his church and other churches . Medication Concerns: none . Services Currently in place:  none  Coping/ Adjustment to diagnosis: . Patient understands treatment plan and what happens next?  Had chronic cough, cancer was found during work up.  Diagnosed less than one week, testing is scheduled, biopsy tomorrow. Uses prayer to handle stress.   Rates his current level of stress as a  "5 out of 10", uses distraction to cope with worry, keeps busy . Concerns about diagnosis and/or treatment: Afraid of cancer and side effects of chemotherapy, 3 family members died of cancer, mother/grandparents; he aware of the serious nature of his disease and has seen other family members pass away due to cancer - most recently his mother died from cancer several years ago.  He has seen people endure difficult chemotherapies and side effects, worries about nausea and similar side effects.  . Patient reported stressors: Adjusting to my illness, Facing my mortality and Physical issues . Hopes and priorities: getting treatment plan,  More information about his diagnosis and plan of care. . Patient enjoys time with family/ friends and connecting with his church family, spending time w his wife, "living my life as best I can" . Current coping skills/ strengths: General fund of knowledge, Religious Affiliation and Supportive family/friends  SUMMARY: Current SDOH Barriers:  . none at this time  Interventions: . Discussed common feeling and emotions when being diagnosed with cancer, and the  importance of support during treatment . Informed patient of the support team roles and support services at [facility] . Provided CSW contact information and encouraged patient to call with any questions or concerns . Provided patient with information about Patient and [place] and [place]   Follow Up Plan: Patient will contact CSW with any support or resource needs Patient verbalizes understanding of plan: Yes    Matthew Pace, LCSW

## 2020-01-19 ENCOUNTER — Other Ambulatory Visit: Payer: Self-pay

## 2020-01-19 ENCOUNTER — Ambulatory Visit (HOSPITAL_COMMUNITY)
Admission: RE | Admit: 2020-01-19 | Discharge: 2020-01-19 | Disposition: A | Discharge status: Home / Self Care | Payer: No Typology Code available for payment source | Source: Ambulatory Visit | Attending: Oncology | Admitting: Oncology

## 2020-01-19 ENCOUNTER — Encounter (HOSPITAL_COMMUNITY): Payer: Self-pay

## 2020-01-19 DIAGNOSIS — K8689 Other specified diseases of pancreas: Secondary | ICD-10-CM

## 2020-01-19 DIAGNOSIS — R059 Cough, unspecified: Secondary | ICD-10-CM | POA: Diagnosis not present

## 2020-01-19 DIAGNOSIS — Z7984 Long term (current) use of oral hypoglycemic drugs: Secondary | ICD-10-CM | POA: Insufficient documentation

## 2020-01-19 DIAGNOSIS — C779 Secondary and unspecified malignant neoplasm of lymph node, unspecified: Secondary | ICD-10-CM | POA: Insufficient documentation

## 2020-01-19 DIAGNOSIS — E119 Type 2 diabetes mellitus without complications: Secondary | ICD-10-CM | POA: Diagnosis not present

## 2020-01-19 DIAGNOSIS — Z79899 Other long term (current) drug therapy: Secondary | ICD-10-CM | POA: Insufficient documentation

## 2020-01-19 DIAGNOSIS — C259 Malignant neoplasm of pancreas, unspecified: Secondary | ICD-10-CM | POA: Diagnosis not present

## 2020-01-19 DIAGNOSIS — Z87891 Personal history of nicotine dependence: Secondary | ICD-10-CM | POA: Insufficient documentation

## 2020-01-19 DIAGNOSIS — I10 Essential (primary) hypertension: Secondary | ICD-10-CM | POA: Insufficient documentation

## 2020-01-19 DIAGNOSIS — Z794 Long term (current) use of insulin: Secondary | ICD-10-CM | POA: Insufficient documentation

## 2020-01-19 DIAGNOSIS — K769 Liver disease, unspecified: Secondary | ICD-10-CM | POA: Diagnosis present

## 2020-01-19 DIAGNOSIS — C787 Secondary malignant neoplasm of liver and intrahepatic bile duct: Secondary | ICD-10-CM | POA: Insufficient documentation

## 2020-01-19 LAB — CBC WITH DIFFERENTIAL/PLATELET
Abs Immature Granulocytes: 0.02 10*3/uL (ref 0.00–0.07)
Basophils Absolute: 0.1 10*3/uL (ref 0.0–0.1)
Basophils Relative: 1 %
Eosinophils Absolute: 0.9 10*3/uL — ABNORMAL HIGH (ref 0.0–0.5)
Eosinophils Relative: 14 %
HCT: 46.7 % (ref 39.0–52.0)
Hemoglobin: 15.5 g/dL (ref 13.0–17.0)
Immature Granulocytes: 0 %
Lymphocytes Relative: 23 %
Lymphs Abs: 1.5 10*3/uL (ref 0.7–4.0)
MCH: 29.8 pg (ref 26.0–34.0)
MCHC: 33.2 g/dL (ref 30.0–36.0)
MCV: 89.6 fL (ref 80.0–100.0)
Monocytes Absolute: 0.5 10*3/uL (ref 0.1–1.0)
Monocytes Relative: 7 %
Neutro Abs: 3.7 10*3/uL (ref 1.7–7.7)
Neutrophils Relative %: 55 %
Platelets: 344 10*3/uL (ref 150–400)
RBC: 5.21 MIL/uL (ref 4.22–5.81)
RDW: 12.4 % (ref 11.5–15.5)
WBC: 6.8 10*3/uL (ref 4.0–10.5)
nRBC: 0 % (ref 0.0–0.2)

## 2020-01-19 LAB — PROTIME-INR
INR: 1.1 (ref 0.8–1.2)
Prothrombin Time: 13.3 seconds (ref 11.4–15.2)

## 2020-01-19 LAB — GLUCOSE, CAPILLARY: Glucose-Capillary: 194 mg/dL — ABNORMAL HIGH (ref 70–99)

## 2020-01-19 MED ORDER — MIDAZOLAM HCL 2 MG/2ML IJ SOLN
INTRAMUSCULAR | Status: AC | PRN
  Administered 2020-01-19 (×3): 1 mg via INTRAVENOUS

## 2020-01-19 MED ORDER — FENTANYL CITRATE (PF) 100 MCG/2ML IJ SOLN
INTRAMUSCULAR | Status: AC
  Filled 2020-01-19: qty 2

## 2020-01-19 MED ORDER — GELATIN ABSORBABLE 12-7 MM EX MISC
CUTANEOUS | Status: AC
  Filled 2020-01-19: qty 1

## 2020-01-19 MED ORDER — FENTANYL CITRATE (PF) 100 MCG/2ML IJ SOLN
INTRAMUSCULAR | Status: AC | PRN
  Administered 2020-01-19 (×2): 50 ug via INTRAVENOUS

## 2020-01-19 MED ORDER — SODIUM CHLORIDE 0.9 % IV SOLN: INTRAVENOUS | Status: DC

## 2020-01-19 MED ORDER — MIDAZOLAM HCL 2 MG/2ML IJ SOLN
INTRAMUSCULAR | Status: AC
  Filled 2020-01-19: qty 4

## 2020-01-19 MED ORDER — LIDOCAINE HCL 1 % IJ SOLN
INTRAMUSCULAR | Status: AC
  Filled 2020-01-19: qty 20

## 2020-01-19 MED ORDER — LIDOCAINE HCL (PF) 1 % IJ SOLN
INTRAMUSCULAR | Status: AC | PRN
  Administered 2020-01-19: 10 mL

## 2020-01-19 NOTE — H&P (Signed)
Chief Complaint: Patient was seen in consultation today for liver lesion biopsy   Referring Physician(s): Ladell Pier  Supervising Physician: Aletta Edouard  Patient Status: [facility] - Out-pt  History of Present Illness: Matthew Osborne is a 44 y.o. male with a medical history significant for DM and HTN. He developed a cough, dyspnea with exertion and unintentional weight loss earlier this year. His work up eventually led to CT imaging which revealed pancreatic and liver lesions concerning for metastatic disease.   CT Abdomen/Pelvis 01/13/20 IMPRESSION: 1. There is a rounded, hypodense mass which appears to arise from the pancreatic neck measuring 4.4 x 3.4 cm. 2. Multiple rim enhancing masses of the liver, primarily in the left lobe, with atrophy of the left lobe and segmental dilatation of the left lobe intrahepatic bile ducts. 3. There is very ill-defined hypodensity of the central liver with effacement of the left portal vein. Findings are consistent with metastatic disease. 4. Numerous enlarged portacaval and retroperitoneal lymph nodes. 5. Constellation of findings above most likely reflects primary pancreatic adenocarcinoma with hepatic and nodal metastatic disease, although ill-defined hypodensity of the central liver and portal vein effacement with biliary obstruction raises a significant differential consideration of cholangiocarcinoma and associated metastatic disease. Suspected primary mass of the pancreatic neck may therefore reflect a nodal metastasis which does not truly arise from the pancreatic parenchyma. Multiphasic contrast enhanced MRI may be helpful to better assess and characterize disease. 6. No evidence of distant metastatic disease in the chest. 7. Coronary artery disease.  Interventional Radiology has been asked to evaluate this patient for an image-guided liver lesion biopsy for further work up. This case has been reviewed and procedure approved by Dr.  Vernard Gambles.   Past Medical History:  Diagnosis Date  . De Quervain's tenosynovitis, left 03/2011  . Diabetes mellitus    NIDDM  . Hyperlipidemia   . Hypertension    under control; has been on med. x 4 yrs.  . Nonproliferative retinopathy due to secondary diabetes [facility])     Past Surgical History:  Procedure Laterality Date  . CARPAL TUNNEL RELEASE     bilat.  . DORSAL COMPARTMENT RELEASE  03/14/2011   Procedure: RELEASE DORSAL COMPARTMENT (DEQUERVAIN);  Surgeon: Paulene Floor, MD;  Location: [place];  Service: Orthopedics;  Laterality: Left;  left wrist APL and EPL tenosynovectomy and 1st dorsal compartment release  . TONSILLECTOMY AND ADENOIDECTOMY  as a child  . VASECTOMY      Allergies: Niaspan [niacin]  Medications: Prior to Admission medications   Medication Sig Start Date End Date Taking? Authorizing Provider  bisoprolol-hydrochlorothiazide [facility]) 10-6.25 MG tablet TAKE 1 TABLET BY MOUTH EVERY DAY IN THE MORNING 12/09/19   Arnoldo Lenis, MD  blood glucose meter kit and supplies KIT Fasting prior to each meal three times a day 08/25/19   Annie Main, FNP  chlorpheniramine-HYDROcodone (TUSSIONEX PENNKINETIC ER) 10-8 MG/5ML SUER Take 5 mLs by mouth every 12 (twelve) hours as needed for cough. Patient not taking: Reported on 01/14/2020 01/13/20   Susy Frizzle, MD  glucose blood test strip Dispense based on patient and insurance preference.  Use twice daily as directed. (FOR ICD-10 E11.65) 08/29/17   Susy Frizzle, MD  insulin glargine (LANTUS) 100 UNIT/ML injection Inject 20 Units into the skin daily.    [provider]  insulin regular (NOVOLIN R) 100 units/mL injection Check fasting blood sugar just prior to eating breakfast, lunch, and dinner Based on your  blood sugar take insulin as directed: BG 150-200 take 2 units BG 201-250 take 4 units BG 251-300 take 6 units BG 301-350 take 8 units BG 351-400 take 10 units BG >401 take 12 units  and call doctor 08/25/19   Ishmael Holter A, FNP  JANUVIA 100 MG tablet TAKE 1 TABLET (100 MG TOTAL) BY MOUTH DAILY. Patient not taking: No sig reported 12/11/18   Susy Frizzle, MD  Lancets Thin MISC Check BS BID 08/29/17   Susy Frizzle, MD  TRUEPLUS LANCETS 33G MISC 1 each by Does not apply route 2 (two) times daily. 07/06/13   Susy Frizzle, MD  TRULICITY 1.5 ZC/5.8IF SOPN INJECT 1.5 MG SUBCUTANEOUSLY INTO THE SKIN ONCE A WEEK 01/18/20   Susy Frizzle, MD     Family History  Problem Relation Age of Onset  . Cancer Other   . Hyperlipidemia Other   . Hypertension Brother   . Asthma Son   . Lung cancer Maternal Grandfather   . Rheum arthritis Maternal Grandmother     Social History   Socioeconomic History  . Marital status: Married    Spouse name: Not on file  . Number of children: Not on file  . Years of education: Not on file  . Highest education level: Not on file  Occupational History  . Not on file  Tobacco Use  . Smoking status: Former Smoker    Packs/day: 3.00    Years: 6.00    Pack years: 18.00    Types: Cigarettes    Quit date: 02/05/1999    Years since quitting: 20.9  . Smokeless tobacco: Never Used  Vaping Use  . Vaping Use: Never used  Substance and Sexual Activity  . Alcohol use: No    Alcohol/week: 0.0 standard drinks  . Drug use: No  . Sexual activity: Not on file  Other Topics Concern  . Not on file  Social History Narrative  . Not on file   Social Determinants of Health   Financial Resource Strain: Low Risk   . Difficulty of Paying Living Expenses: Not hard at all  Food Insecurity: No Food Insecurity  . Worried About Charity fundraiser in the Last Year: Never true  . Ran Out of Food in the Last Year: Never true  Transportation Needs: No Transportation Needs  . Lack of Transportation (Medical): No  . Lack of Transportation (Non-Medical): No  Physical Activity: Not on file  Stress: Stress Concern Present  . Feeling of Stress : To  some extent  Social Connections: Socially Integrated  . Frequency of Communication with Friends and Family: More than three times a week  . Frequency of Social Gatherings with Friends and Family: More than three times a week  . Attends Religious Services: More than 4 times per year  . Active Member of Clubs or Organizations: Yes  . Attends Archivist Meetings: More than 4 times per year  . Marital Status: Married    Review of Systems: A 12 point ROS discussed and pertinent positives are indicated in the HPI above.  All other systems are negative.  Review of Systems  Constitutional: Negative for appetite change and fatigue.  Respiratory: Positive for cough and shortness of breath.   Cardiovascular: Negative for chest pain and leg swelling.  Gastrointestinal: Negative for abdominal pain, diarrhea, nausea and vomiting.  Musculoskeletal: Negative for back pain.  Neurological: Negative for dizziness and headaches.  Psychiatric/Behavioral: The patient is nervous/anxious.  Vital Signs: BP 127/81   Pulse (!) 51   Temp 98.2 F (36.8 C) (Oral)   Resp 18   Ht $R'6\' 2"'gF$  (1.88 m)   Wt 211 lb 6.7 oz (95.9 kg)   SpO2 100%   BMI 27.14 kg/m   Physical Exam Constitutional:      General: He is not in acute distress.    Appearance: He is not ill-appearing.  HENT:     Mouth/Throat:     Mouth: Mucous membranes are moist.     Pharynx: Oropharynx is clear.  Cardiovascular:     Rate and Rhythm: Normal rate and regular rhythm.     Pulses: Normal pulses.     Heart sounds: Normal heart sounds.  Pulmonary:     Effort: Pulmonary effort is normal.     Breath sounds: Normal breath sounds.     Comments: Dyspnea with exertion; cough with exertion Abdominal:     General: Bowel sounds are normal.     Palpations: Abdomen is soft.     Tenderness: There is no abdominal tenderness.  Musculoskeletal:        General: Normal range of motion.     Cervical back: Normal range of motion.  Skin:     General: Skin is warm and dry.  Neurological:     Mental Status: He is alert and oriented to person, place, and time.     Imaging: DG Chest 2 View  Result Date: 01/01/2020 CLINICAL DATA:  Shortness of breath and cough EXAM: CHEST - 2 VIEW COMPARISON:  July 15, 2017 FINDINGS: Lungs are clear. The heart size and pulmonary vascularity are normal. No adenopathy. No bone lesions. IMPRESSION: Lungs clear.  Cardiac silhouette normal. Electronically Signed   By: Lowella Grip III M.D.   On: 01/01/2020 08:56   CT Chest W Contrast  Result Date: 01/13/2020 CLINICAL DATA:  Unintended weight loss, elevated LFT EXAM: CT CHEST, ABDOMEN, AND PELVIS WITH CONTRAST TECHNIQUE: Multidetector CT imaging of the chest, abdomen and pelvis was performed following the standard protocol during bolus administration of intravenous contrast. CONTRAST:  184mL OMNIPAQUE IOHEXOL 300 MG/ML  SOLN COMPARISON:  CT chest angiogram, 01/18/2013 FINDINGS: CT CHEST FINDINGS Cardiovascular: Left and right coronary artery calcifications and/or stents. Normal heart size. No pericardial effusion. Mediastinum/Nodes: No enlarged mediastinal, hilar, or axillary lymph nodes. Thyroid gland, trachea, and esophagus demonstrate no significant findings. Lungs/Pleura: Lungs are clear. No pleural effusion or pneumothorax. Musculoskeletal: No chest wall mass or suspicious bone lesions identified. CT ABDOMEN PELVIS FINDINGS Hepatobiliary: Multiple rim enhancing masses of the liver, primarily in the left lobe, with atrophy of the left lobe and segmental dilatation of the left lobe intrahepatic bile ducts (series 4, image 16). The largest index mass of the anterior left lobe of the liver measures 3.4 x 2.9 cm (series 4, image 21). There is very ill-defined hypodensity of the central liver with effacement of the left portal vein (series 4, image 17). No gallstones, gallbladder wall thickening, or biliary dilatation. Pancreas: There is a rounded, hypodense  mass of the pancreatic neck measuring 4.4 x 3.4 cm no pancreatic ductal dilatation or surrounding inflammatory changes. Spleen: Normal in size without significant abnormality. Adrenals/Urinary Tract: Adrenal glands are unremarkable. Kidneys are normal, without renal calculi, solid lesion, or hydronephrosis. Bladder is unremarkable. Stomach/Bowel: Stomach is within normal limits. Appendix appears normal. No evidence of bowel wall thickening, distention, or inflammatory changes. Vascular/Lymphatic: No significant vascular findings are present. Enlarged portacaval lymph nodes measuring up to 4.5 x 3.0  cm (series 4, image 24). Numerous enlarged retroperitoneal lymph nodes measuring up to 1.9 x 1.1 cm (series 4, image 34). Reproductive: No mass or other abnormality. Other: No abdominal wall hernia or abnormality. No abdominopelvic ascites. Musculoskeletal: No acute or significant osseous findings. IMPRESSION: 1. There is a rounded, hypodense mass which appears to arise from the pancreatic neck measuring 4.4 x 3.4 cm. 2. Multiple rim enhancing masses of the liver, primarily in the left lobe, with atrophy of the left lobe and segmental dilatation of the left lobe intrahepatic bile ducts. 3. There is very ill-defined hypodensity of the central liver with effacement of the left portal vein. Findings are consistent with metastatic disease. 4. Numerous enlarged portacaval and retroperitoneal lymph nodes. 5. Constellation of findings above most likely reflects primary pancreatic adenocarcinoma with hepatic and nodal metastatic disease, although ill-defined hypodensity of the central liver and portal vein effacement with biliary obstruction raises a significant differential consideration of cholangiocarcinoma and associated metastatic disease. Suspected primary mass of the pancreatic neck may therefore reflect a nodal metastasis which does not truly arise from the pancreatic parenchyma. Multiphasic contrast enhanced MRI may be  helpful to better assess and characterize disease. 6. No evidence of distant metastatic disease in the chest. 7. Coronary artery disease. These results will be called to the ordering clinician or representative by the Radiologist Assistant, and communication documented in the PACS or [employer]. Electronically Signed   By: Eddie Candle M.D.   On: 01/13/2020 16:40   CT Abdomen Pelvis W Contrast  Result Date: 01/13/2020 CLINICAL DATA:  Unintended weight loss, elevated LFT EXAM: CT CHEST, ABDOMEN, AND PELVIS WITH CONTRAST TECHNIQUE: Multidetector CT imaging of the chest, abdomen and pelvis was performed following the standard protocol during bolus administration of intravenous contrast. CONTRAST:  153mL OMNIPAQUE IOHEXOL 300 MG/ML  SOLN COMPARISON:  CT chest angiogram, 01/18/2013 FINDINGS: CT CHEST FINDINGS Cardiovascular: Left and right coronary artery calcifications and/or stents. Normal heart size. No pericardial effusion. Mediastinum/Nodes: No enlarged mediastinal, hilar, or axillary lymph nodes. Thyroid gland, trachea, and esophagus demonstrate no significant findings. Lungs/Pleura: Lungs are clear. No pleural effusion or pneumothorax. Musculoskeletal: No chest wall mass or suspicious bone lesions identified. CT ABDOMEN PELVIS FINDINGS Hepatobiliary: Multiple rim enhancing masses of the liver, primarily in the left lobe, with atrophy of the left lobe and segmental dilatation of the left lobe intrahepatic bile ducts (series 4, image 16). The largest index mass of the anterior left lobe of the liver measures 3.4 x 2.9 cm (series 4, image 21). There is very ill-defined hypodensity of the central liver with effacement of the left portal vein (series 4, image 17). No gallstones, gallbladder wall thickening, or biliary dilatation. Pancreas: There is a rounded, hypodense mass of the pancreatic neck measuring 4.4 x 3.4 cm no pancreatic ductal dilatation or surrounding inflammatory changes. Spleen: Normal in size  without significant abnormality. Adrenals/Urinary Tract: Adrenal glands are unremarkable. Kidneys are normal, without renal calculi, solid lesion, or hydronephrosis. Bladder is unremarkable. Stomach/Bowel: Stomach is within normal limits. Appendix appears normal. No evidence of bowel wall thickening, distention, or inflammatory changes. Vascular/Lymphatic: No significant vascular findings are present. Enlarged portacaval lymph nodes measuring up to 4.5 x 3.0 cm (series 4, image 24). Numerous enlarged retroperitoneal lymph nodes measuring up to 1.9 x 1.1 cm (series 4, image 34). Reproductive: No mass or other abnormality. Other: No abdominal wall hernia or abnormality. No abdominopelvic ascites. Musculoskeletal: No acute or significant osseous findings. IMPRESSION: 1. There is a rounded, hypodense mass  which appears to arise from the pancreatic neck measuring 4.4 x 3.4 cm. 2. Multiple rim enhancing masses of the liver, primarily in the left lobe, with atrophy of the left lobe and segmental dilatation of the left lobe intrahepatic bile ducts. 3. There is very ill-defined hypodensity of the central liver with effacement of the left portal vein. Findings are consistent with metastatic disease. 4. Numerous enlarged portacaval and retroperitoneal lymph nodes. 5. Constellation of findings above most likely reflects primary pancreatic adenocarcinoma with hepatic and nodal metastatic disease, although ill-defined hypodensity of the central liver and portal vein effacement with biliary obstruction raises a significant differential consideration of cholangiocarcinoma and associated metastatic disease. Suspected primary mass of the pancreatic neck may therefore reflect a nodal metastasis which does not truly arise from the pancreatic parenchyma. Multiphasic contrast enhanced MRI may be helpful to better assess and characterize disease. 6. No evidence of distant metastatic disease in the chest. 7. Coronary artery disease. These  results will be called to the ordering clinician or representative by the Radiologist Assistant, and communication documented in the PACS or [employer]. Electronically Signed   By: Eddie Candle M.D.   On: 01/13/2020 16:40   Exercise Tolerance Test  Result Date: 12/29/2019  Nondiagnostic exercise treadmill test, patient achieved 77% MPHR and maximum workload of 11.1 METS. Exercised for 9 minutes and 23 seconds, stopped due to shortness of breath and coughing spells raising possibility of exercise-induced asthma. There were equivocal ST segment changes, not consistently diagnostic for ischemia. No sustained arrhythmias. Hypertensive blood pressure response noted. Could consider pharmacologic stress testing if clinically indicated.  Blood pressure demonstrated a hypertensive response to exercise.     Labs:  CBC: Recent Labs    08/20/19 1019 09/10/19 1457 01/11/20 1046 01/19/20 1140  WBC 6.9 11.5* 11.3* 6.8  HGB 16.2 14.9 15.7 15.5  HCT 48.4 45.7 46.4 46.7  PLT 304 273 390 344    COAGS: Recent Labs    01/14/20 1215 01/19/20 1140  INR 1.0 1.1    BMP: Recent Labs    08/20/19 1019 09/10/19 1457 01/11/20 1046 01/14/20 1214  NA 138 134* 128* 135  K 4.6 4.4 4.7 4.3  CL 101 98 92* 95*  CO2 $Re'27 25 26 30  'WYF$ GLUCOSE 182* 374* 370* 246*  BUN $Re'12 13 17 10  'bSx$ CALCIUM 9.3 9.3 10.3 11.0*  CREATININE 1.01 1.10 0.78 1.06  GFRNONAA 90 81 110 >60  GFRAA 104 94 127  --     LIVER FUNCTION TESTS: Recent Labs    08/20/19 1019 09/10/19 1457 01/11/20 1046 01/14/20 1214  BILITOT 0.7 0.5 0.9 0.9  AST 28 33 47* 41  ALT 25 28 51* 47*  ALKPHOS  --   --   --  393*  PROT 7.5 7.0 7.4 8.2*  ALBUMIN  --   --   --  3.3*    TUMOR MARKERS: No results for input(s): AFPTM, CEA, CA199, CHROMGRNA in the last 8760 hours.  Assessment and Plan:  Pancreatic and liver lesions; possible metastatic disease: Matthew Osborne, 44 year old male, presents today to the [place]  [facility]iology department for an image-guided liver lesion biopsy for further work up and evaluation.  Risks and benefits of a liver lesion biopsy were discussed with the patient including, but not limited to bleeding, infection, damage to adjacent structures or low yield requiring additional tests.  All of the questions were answered and there is agreement to proceed.  The patient has been NPO. Labs and  vitals have been reviewed.  Consent signed and in chart.  Thank you for this interesting consult.  I greatly enjoyed meeting Matthew Osborne and look forward to participating in their care.  A copy of this report was sent to the requesting provider on this date.  Electronically Signed: Soyla Dryer, AGACNP-BC [phone] 01/19/2020, 12:58 PM   I spent a total of  30 Minutes   in face to face in clinical consultation, greater than 50% of which was counseling/coordinating care for image-guided liver lesion biopsy.

## 2020-01-19 NOTE — Procedures (Signed)
Interventional Radiology Procedure Note  Procedure: US Guided Biopsy of left lobe liver lesion  Complications: None  Estimated Blood Loss: < 10 mL  Findings: 128 G core biopsy of left lobe liver lesion performed under US guidance.  Three core samples obtained and sent to Pathology.  Venetia Night. Kathlene Cote, M.D Pager:  [phone]

## 2020-01-19 NOTE — Discharge Instructions (Signed)
Urgent needs - IR on call MD [phone]  Wound - May remove dressing and shower tomorrow.  Keep site clean and dry.  Replace with bandaid as necessary . Do not submerge in tub or water until site healing well.   Liver Biopsy, Care After These instructions give you information about how to care for yourself after your procedure. Your health care provider may also give you more specific instructions. If you have problems or questions, contact your health care provider. What can I expect after the procedure? After your procedure, it is common to have:  Pain and soreness in the area where the biopsy was done.  Bruising around the area where the biopsy was done.  Sleepiness and fatigue for 1-2 days. Follow these instructions at home: Medicines  Take over-the-counter and prescription medicines only as told by your health care provider.  If you were prescribed an antibiotic medicine, take it as told by your health care provider. Do not stop taking the antibiotic even if you start to feel better.  Do not take medicines such as aspirin and ibuprofen unless your health care provider tells you to take them. These medicines thin your blood and can increase the risk of bleeding.  If you are taking prescription pain medicine, take actions to prevent or treat constipation. Your health care provider may recommend that you: ? Drink enough fluid to keep your urine pale yellow. ? Eat foods that are high in fiber, such as fresh fruits and vegetables, whole grains, and beans. ? Limit foods that are high in fat and processed sugars, such as fried or sweet foods. ? Take an over-the-counter or prescription medicine for constipation. Incision care  Follow instructions from your health care provider about how to take care of your incision. Make sure you: ? Wash your hands with soap and water before you change your bandage (dressing). If soap and water are not available, use hand sanitizer. ? Change your  dressing as told by your health care provider. ? Leave stitches (sutures), skin glue, or adhesive strips in place. These skin closures may need to stay in place for 2 weeks or longer. If adhesive strip edges start to loosen and curl up, you may trim the loose edges. Do not remove adhesive strips completely unless your health care provider tells you to do that.  Check your incision area every day for signs of infection. Check for: ? Redness, swelling, or pain. ? Fluid or blood. ? Warmth. ? Pus or a bad smell.  Do not take baths, swim, or use a hot tub until your health care provider says it is okay to do so. Activity  Rest at home for 1-2 days, or as directed by your health care provider. ? Avoid sitting for a long time without moving. Get up to take short walks every 1-2 hours. This is important to improve blood flow and breathing. Ask for help if you feel weak or unsteady.  Return to your normal activities as told by your health care provider. Ask your health care provider what activities are safe for you.  Do not drive or use heavy machinery while taking prescription pain medicine.  Do not lift anything that is heavier than 10 lb (4.5 kg), or the limit that your health care provider tells you, until he or she says that it is safe.  Do not play contact sports for 2 weeks after the procedure. General instructions  Do not drink alcohol in the first week after the procedure.  Have someone stay with you for at least 24 hours after the procedure.  It is your responsibility to obtain your test results. Ask your health care provider, or the department that is doing the test: ? When will my results be ready? ? How will I get my results? ? What are my treatment options? ? What other tests do I need? ? What are my next steps?  Keep all follow-up visits as told by your health care provider. This is important. Contact a health care provider if:  You have increased bleeding from an  incision, resulting in more than a small spot of blood.  You have redness, swelling, or increasing pain in any incisions.  You notice a discharge or a bad smell coming from any of your incisions.  You have a fever or chills. Get help right away if:  You develop swelling, bloating, or pain in your abdomen.  You become dizzy or faint.  You develop a rash.  You have nausea or you vomit.  You faint, or you have shortness of breath or difficulty breathing.  You develop chest pain.  You have problems with your speech or vision.  You have trouble with your balance or moving your arms or legs. Summary  After the liver biopsy, it is common to have pain, soreness, and bruising in the area, as well as sleepiness and fatigue.  Take over-the-counter and prescription medicines only as told by your health care provider.  Follow instructions from your health care provider about how to care for your incision. Check the incision area daily for signs of infection. This information is not intended to replace advice given to you by your health care provider. Make sure you discuss any questions you have with your health care provider. Document Revised: 03/16/2018 Document Reviewed: 01/31/2017 Elsevier Patient Education  [place].   Moderate Conscious Sedation, Adult, Care After These instructions provide you with information about caring for yourself after your procedure. Your health care provider may also give you more specific instructions. Your treatment has been planned according to current medical practices, but problems sometimes occur. Call your health care provider if you have any problems or questions after your procedure. What can I expect after the procedure? After your procedure, it is common:  To feel sleepy for several hours.  To feel clumsy and have poor balance for several hours.  To have poor judgment for several hours.  To vomit if you eat too soon. Follow these  instructions at home: For at least 24 hours after the procedure:  Do not: ? Participate in activities where you could fall or become injured. ? Drive. ? Use heavy machinery. ? Drink alcohol. ? Take sleeping pills or medicines that cause drowsiness. ? Make important decisions or sign legal documents. ? Take care of children on your own.  Rest. Eating and drinking  Follow the diet recommended by your health care provider.  If you vomit: ? Drink water, juice, or soup when you can drink without vomiting. ? Make sure you have little or no nausea before eating solid foods. General instructions  Have a responsible adult stay with you until you are awake and alert.  Take over-the-counter and prescription medicines only as told by your health care provider.  If you smoke, do not smoke without supervision.  Keep all follow-up visits as told by your health care provider. This is important. Contact a health care provider if:  You keep feeling nauseous or you keep vomiting.  You feel light-headed.  You develop a rash.  You have a fever. Get help right away if:  You have trouble breathing. This information is not intended to replace advice given to you by your health care provider. Make sure you discuss any questions you have with your health care provider. Document Revised: 01/03/2017 Document Reviewed: 05/13/2015 Elsevier Patient Education  2020 [employer].

## 2020-01-20 ENCOUNTER — Ambulatory Visit (HOSPITAL_COMMUNITY): Payer: No Typology Code available for payment source

## 2020-01-21 ENCOUNTER — Other Ambulatory Visit: Payer: Self-pay | Admitting: *Deleted

## 2020-01-21 ENCOUNTER — Inpatient Hospital Stay (HOSPITAL_BASED_OUTPATIENT_CLINIC_OR_DEPARTMENT_OTHER): Payer: No Typology Code available for payment source | Admitting: Oncology

## 2020-01-21 ENCOUNTER — Inpatient Hospital Stay: Payer: No Typology Code available for payment source

## 2020-01-21 ENCOUNTER — Other Ambulatory Visit: Payer: Self-pay

## 2020-01-21 VITALS — BP 125/69 | HR 63 | Temp 97.7°F | Resp 19 | Ht 74.0 in | Wt 212.7 lb

## 2020-01-21 DIAGNOSIS — C250 Malignant neoplasm of head of pancreas: Secondary | ICD-10-CM

## 2020-01-21 DIAGNOSIS — K8689 Other specified diseases of pancreas: Secondary | ICD-10-CM

## 2020-01-21 DIAGNOSIS — C259 Malignant neoplasm of pancreas, unspecified: Secondary | ICD-10-CM | POA: Insufficient documentation

## 2020-01-21 DIAGNOSIS — Z7189 Other specified counseling: Secondary | ICD-10-CM | POA: Diagnosis not present

## 2020-01-21 DIAGNOSIS — Z5111 Encounter for antineoplastic chemotherapy: Secondary | ICD-10-CM | POA: Diagnosis not present

## 2020-01-21 LAB — CMP (CANCER CENTER ONLY)
ALT: 43 U/L (ref 0–44)
AST: 55 U/L — ABNORMAL HIGH (ref 15–41)
Albumin: 3.3 g/dL — ABNORMAL LOW (ref 3.5–5.0)
Alkaline Phosphatase: 392 U/L — ABNORMAL HIGH (ref 38–126)
Anion gap: 7 (ref 5–15)
BUN: 9 mg/dL (ref 6–20)
CO2: 30 mmol/L (ref 22–32)
Calcium: 9.5 mg/dL (ref 8.9–10.3)
Chloride: 98 mmol/L (ref 98–111)
Creatinine: 1.03 mg/dL (ref 0.61–1.24)
GFR, Estimated: >60 mL/min (ref >60)
Glucose, Bld: 294 mg/dL — ABNORMAL HIGH (ref 70–99)
Potassium: 4.2 mmol/L (ref 3.5–5.1)
Sodium: 135 mmol/L (ref 135–145)
Total Bilirubin: 0.6 mg/dL (ref 0.3–1.2)
Total Protein: 7.8 g/dL (ref 6.5–8.1)

## 2020-01-21 MED ORDER — TRAMADOL HCL 50 MG PO TABS: 50.0000 mg | ORAL_TABLET | Freq: Four times a day (QID) | ORAL | 0 refills | Status: DC | PRN

## 2020-01-21 MED FILL — traMADol HCL 50 MG TABS: 50 | 30 days supply | Qty: 30 | Fill #0

## 2020-01-21 NOTE — Progress Notes (Signed)
START ON PATHWAY REGIMEN - Pancreatic Adenocarcinoma     A cycle is every 14 days:     Oxaliplatin      Leucovorin      Irinotecan      Fluorouracil   **Always confirm dose/schedule in your pharmacy ordering system**  Patient Characteristics: Metastatic Disease, First Line, PS = 0,1, BRCA1/2 and PALB2  Mutation Absent/Unknown Therapeutic Status: Metastatic Disease Line of Therapy: First Line ECOG Performance Status: 1 BRCA1/2 Mutation Status: Awaiting Test Results PALB2 Mutation Status: Awaiting Test Results Intent of Therapy: Non-Curative / Palliative Intent, Discussed with Patient 

## 2020-01-21 NOTE — Progress Notes (Signed)
[facility] OFFICE PROGRESS NOTE   Diagnosis: Adenocarcinoma  INTERVAL HISTORY:   [name] returns for a scheduled visit.  He was treated for hypercalcemia on 01/14/2020.  He reports tolerating Zometa well.  His cough is improved.  No new complaint.  He has a good appetite.  He is here today with his wife.  He underwent an ultrasound-guided biopsy of a left liver lesion on 01/19/2020.  He reports tolerated the procedure well.  The pathology revealed adenocarcinoma with a differential diagnosis including a metastatic pancreaticobiliary adenocarcinoma.  Objective:  Vital signs in last 24 hours:  Blood pressure 125/69, pulse 63, temperature 97.7 F (36.5 C), temperature source Tympanic, resp. rate 19, height 6\' 2"  (4.09 m), weight 212 lb 11.2 oz (96.5 kg), SpO2 99 %.     Resp: Lungs clear bilaterally Cardio: Regular rate and rhythm GI: No hepatosplenomegaly, nontender, no mass Vascular: No leg edema   Lab Results:  Lab Results  Component Value Date   WBC 6.8 01/19/2020   HGB 15.5 01/19/2020   HCT 46.7 01/19/2020   MCV 89.6 01/19/2020   PLT 344 01/19/2020   NEUTROABS 3.7 01/19/2020    CMP  Lab Results  Component Value Date   NA 135 01/21/2020   K 4.2 01/21/2020   CL 98 01/21/2020   CO2 30 01/21/2020   GLUCOSE 294 (H) 01/21/2020   BUN 9 01/21/2020   CREATININE 1.03 01/21/2020   CALCIUM 9.5 01/21/2020   PROT 7.8 01/21/2020   ALBUMIN 3.3 (L) 01/21/2020   AST 55 (H) 01/21/2020   ALT 43 01/21/2020   ALKPHOS 392 (H) 01/21/2020   BILITOT 0.6 01/21/2020   GFRNONAA >60 01/21/2020   GFRAA 127 01/11/2020    Lab Results  Component Value Date   CEA1 2.15 01/14/2020    Imaging:  [place] CORE BIOPSY (LIVER)  Result Date: 01/19/2020 INDICATION: Liver masses, periportal and peripancreatic lymphadenopathy and possible pancreatic mass. EXAM: ULTRASOUND GUIDED CORE BIOPSY OF LIVER MASS MEDICATIONS: None. ANESTHESIA/SEDATION: Fentanyl 100 mcg IV; Versed 3.0 mg  IV Moderate Sedation Time:  13 minutes. The patient was continuously monitored during the procedure by the interventional radiology nurse under my direct supervision. PROCEDURE: The procedure, risks, benefits, and alternatives were explained to the patient. Questions regarding the procedure were encouraged and answered. The patient understands and consents to the procedure. A time-out was performed prior to initiating the procedure. Ultrasound was performed of the liver to localize lesions. The abdominal wall was prepped with chlorhexidine in a sterile fashion, and a sterile drape was applied covering the operative field. A sterile gown and sterile gloves were used for the procedure. Local anesthesia was provided with 1% Lidocaine. Under ultrasound guidance, a 17 gauge trocar needle was advanced into the left lobe of the liver. After confirming needle tip position, 3 separate coaxial 18 gauge core biopsy samples were obtained and submitted in formalin. Gel-Foam pledgets were advanced through the outer needle as the needle was retracted and removed. Additional ultrasound was performed. COMPLICATIONS: None immediate. FINDINGS: Multiple ill-defined hypoechoic mass lesions are seen in the liver. There also is focal biliary ductal dilatation localizing to the lateral segment of the left lobe of the liver. An oval-shaped lesion in the left lobe of the liver was chosen for sampling and measures approximately 2.9 x 2.1 x 2.1 cm. Solid tissue was obtained. IMPRESSION: Ultrasound-guided core biopsy performed a mass within the left lobe of the liver measuring 2.9 cm in greatest diameter. Electronically Signed   By:  Aletta Edouard M.D.   On: 01/19/2020 15:40    Medications: I have reviewed the patient's current medications.   Assessment/Plan: 1. Adenocarcinoma, multiple liver masses and probable pancreas neck mass  CTs 01/13/2020-rounded hypodense mass appears to arise from the pancreas neck, multiple rim-enhancing  masses in the liver, primarily left liver with segmental dilation of the left lobe Intermatic bile ducts, ill-defined hypodensity the central liver with effacement of the left portal vein, enlarged portacaval and retroperitoneal lymph nodes  Ultrasound-guided biopsy of the left liver lesion 01/19/2020-adenocarcinoma, cytokeratin 7+  2. Cough-likely related to diaphragmatic irritation from #1 3. Anorexia/weight loss 4. Hypercalcemia-likely hypercalcemia malignancy, status post intravenous hydration and Zometa 01/14/2020 5. Diabetes 6. Hyperlipidemia 7. Hypertension 8. Peripheral neuropathy secondary to diabetes    Disposition: [name] has been diagnosed with adenocarcinoma involving biopsy of a left liver mass.  The clinical presentation is consistent with a diagnosis of metastatic pancreas cancer, but cholangiocarcinoma remains in the differential diagnosis.  He will be referred for an MRI of the abdomen to better clarify the nature of the "pancreas "mass.  We discussed the prognosis and treatment options.  The clinical impression is most consistent with a diagnosis of pancreas cancer.  We discussed the expected prognosis with a comfort care approach and with palliative systemic chemotherapy.  I recommend FOLFIRINOX chemotherapy.  We may consider adjusting this recommendation based on findings from the abdominal MRI and discussion of his case at the GI tumor conference.  We reviewed potential toxicities associated with the FOLFIRINOX regimen including the chance for nausea/vomiting, mucositis, diarrhea, alopecia, and hematologic toxicity.  We discussed the sun sensitivity, rash, hyperpigmentation, and hand/foot syndrome associated with 5-fluorouracil.  We reviewed the allergic reaction and various types of neuropathy seen with oxaliplatin.  We discussed the acute/delayed diarrhea and abdominal cramping associated with irinotecan.  He agrees to proceed.  He will attend a chemotherapy teaching  class. He understands the pre-existing diabetic neuropathy may worsen while on treatment with oxaliplatin.   The plan is to complete 1 cycle of FOLFOX.  Irinotecan will be added with cycle 2 depending on his tolerance of FOLFOX.  Mr. Rylee will be referred for Port-A-Cath placement.  He is scheduled to take a trip out of town next week.  We will request Port-A-Cath placement during the week of 01/31/2020.  The plan is for cycle 1 chemotherapy on 02/02/2020.  I offered to refer him for a second opinion.  He will consider this option and let us know if he desires a referral.    Betsy Coder, MD  01/21/2020  4:28 PM

## 2020-01-24 NOTE — Progress Notes (Signed)
Sent email requesting [facility] testing be done on Case:  [id number] to [facility].

## 2020-01-26 ENCOUNTER — Other Ambulatory Visit: Payer: Self-pay | Admitting: *Deleted

## 2020-01-26 ENCOUNTER — Other Ambulatory Visit: Payer: Self-pay

## 2020-01-26 MED ORDER — PROCHLORPERAZINE MALEATE 10 MG PO TABS: 10.0000 mg | ORAL_TABLET | Freq: Four times a day (QID) | ORAL | 1 refills | Status: DC | PRN

## 2020-01-26 MED ORDER — LIDOCAINE-PRILOCAINE 2.5-2.5 % EX CREA: 1.0000 "application " | TOPICAL_CREAM | CUTANEOUS | 2 refills | Status: DC

## 2020-01-26 MED ORDER — ONDANSETRON HCL 8 MG PO TABS: 8.0000 mg | ORAL_TABLET | Freq: Three times a day (TID) | ORAL | 1 refills | Status: DC | PRN

## 2020-01-26 MED FILL — PROCHLORPERAZINE 10 MG TAB: 10 | 15 days supply | Qty: 60 | Fill #0

## 2020-01-26 MED FILL — ONDANSETRON HCL 8 MG TABLET: 8 | 10 days supply | Qty: 30 | Fill #0

## 2020-01-26 MED FILL — LIDOCAINE-PRILOCAINE CREAM: 2.5-2.5 | 30 days supply | Qty: 30 | Fill #0

## 2020-01-26 NOTE — Progress Notes (Signed)
Notified by education nurse that scripts for EMLA and anti-emetics are needed.

## 2020-01-26 NOTE — Progress Notes (Signed)
The proposed treatment discussed in conference is for discussion purposes only and is not a binding recommendation.  The patients have not been physically examined, or presented with their treatment options.  Therefore, final treatment plans cannot be decided.   

## 2020-01-26 NOTE — Progress Notes (Signed)
Pharmacist Chemotherapy Monitoring - Initial Assessment    Anticipated start date: 02/02/2020   Regimen:  . Are orders appropriate based on the patient's diagnosis, regimen, and cycle? Yes . Does the plan date match the patient's scheduled date? Yes . Is the sequencing of drugs appropriate? Yes . Are the premedications appropriate for the patient's regimen? Yes . Prior Authorization for treatment is: Pending o If applicable, is the correct biosimilar selected based on the patient's insurance? not applicable  Organ Function and Labs: [name] Are dose adjustments needed based on the patient's renal function, hepatic function, or hematologic function? No . Are appropriate labs ordered prior to the start of patient's treatment? Yes . Other organ system assessment, if indicated: N/A . The following baseline labs, if indicated, have been ordered: N/A  Dose Assessment: . Are the drug doses appropriate? Yes . Are the following correct: o Drug concentrations Yes o IV fluid compatible with drug Yes o Administration routes Yes o Timing of therapy Yes . If applicable, does the patient have documented access for treatment and/or plans for port-a-cath placement? not applicable . If applicable, have lifetime cumulative doses been properly documented and assessed? not applicable Lifetime Dose Tracking  No doses have been documented on this patient for the following tracked chemicals: Doxorubicin, Epirubicin, Idarubicin, Daunorubicin, Mitoxantrone, Bleomycin, Oxaliplatin, Carboplatin, Liposomal Doxorubicin  o   Toxicity Monitoring/Prevention: . The patient has the following take home antiemetics prescribed: Ondansetron and Prochlorperazine . The patient has the following take home medications prescribed: N/A . Medication allergies and previous infusion related reactions, if applicable, have been reviewed and addressed. No . The patient's current medication list has been assessed for drug-drug  interactions with their chemotherapy regimen. no significant drug-drug interactions were identified on review.  Order Review: . Are the treatment plan orders signed? No . Is the patient scheduled to see a provider prior to their treatment? Yes  I verify that I have reviewed each item in the above checklist and answered each question accordingly.  Kongmeng Santoro D 01/26/2020 4:32 PM

## 2020-01-27 ENCOUNTER — Encounter: Payer: Self-pay | Admitting: Family Medicine

## 2020-01-27 ENCOUNTER — Other Ambulatory Visit: Payer: Self-pay | Admitting: *Deleted

## 2020-01-27 ENCOUNTER — Other Ambulatory Visit: Payer: Self-pay

## 2020-01-27 DIAGNOSIS — E1165 Type 2 diabetes mellitus with hyperglycemia: Secondary | ICD-10-CM

## 2020-01-27 DIAGNOSIS — K8689 Other specified diseases of pancreas: Secondary | ICD-10-CM

## 2020-01-27 NOTE — Progress Notes (Signed)
Placed referrals for CSW and dietician.

## 2020-01-31 ENCOUNTER — Other Ambulatory Visit: Payer: Self-pay

## 2020-01-31 ENCOUNTER — Ambulatory Visit
Admission: RE | Admit: 2020-01-31 | Discharge: 2020-01-31 | Disposition: A | Discharge status: Home / Self Care | Payer: No Typology Code available for payment source | Source: Ambulatory Visit | Attending: Oncology | Admitting: Oncology

## 2020-01-31 ENCOUNTER — Other Ambulatory Visit: Payer: Self-pay | Admitting: Student

## 2020-01-31 ENCOUNTER — Inpatient Hospital Stay: Payer: No Typology Code available for payment source

## 2020-01-31 DIAGNOSIS — K8689 Other specified diseases of pancreas: Secondary | ICD-10-CM

## 2020-01-31 MED ORDER — GADOBENATE DIMEGLUMINE 529 MG/ML IV SOLN
15.0000 mL | Freq: Once | INTRAVENOUS | Status: AC | PRN
  Administered 2020-01-31: 15 mL via INTRAVENOUS

## 2020-01-31 MED ORDER — INSULIN GLARGINE 100 UNIT/ML ~~LOC~~ SOLN: 40.0000 [IU] | Freq: Every day | SUBCUTANEOUS | 3 refills | Status: DC

## 2020-01-31 MED ORDER — "INSULIN SYRINGE 27G X 1/2"" 0.5 ML MISC": 3 refills | Status: AC

## 2020-01-31 MED ORDER — INSULIN REGULAR HUMAN 100 UNIT/ML IJ SOLN: INTRAMUSCULAR | 0 refills | Status: DC

## 2020-02-01 ENCOUNTER — Telehealth: Payer: Self-pay

## 2020-02-01 ENCOUNTER — Other Ambulatory Visit: Payer: Self-pay | Admitting: Oncology

## 2020-02-01 ENCOUNTER — Inpatient Hospital Stay: Payer: No Typology Code available for payment source | Admitting: Nurse Practitioner

## 2020-02-01 ENCOUNTER — Ambulatory Visit: Payer: Self-pay | Admitting: Student

## 2020-02-01 ENCOUNTER — Inpatient Hospital Stay (HOSPITAL_BASED_OUTPATIENT_CLINIC_OR_DEPARTMENT_OTHER): Payer: No Typology Code available for payment source | Admitting: Oncology

## 2020-02-01 ENCOUNTER — Other Ambulatory Visit: Payer: No Typology Code available for payment source

## 2020-02-01 ENCOUNTER — Inpatient Hospital Stay: Payer: No Typology Code available for payment source

## 2020-02-01 ENCOUNTER — Other Ambulatory Visit: Payer: Self-pay

## 2020-02-01 ENCOUNTER — Ambulatory Visit: Payer: No Typology Code available for payment source | Admitting: Nurse Practitioner

## 2020-02-01 ENCOUNTER — Ambulatory Visit (HOSPITAL_COMMUNITY)
Admission: RE | Admit: 2020-02-01 | Discharge: 2020-02-01 | Disposition: A | Discharge status: Home / Self Care | Payer: No Typology Code available for payment source | Source: Ambulatory Visit | Attending: Oncology | Admitting: Oncology

## 2020-02-01 VITALS — BP 139/86 | HR 70 | Temp 97.0°F | Resp 18 | Ht 74.0 in | Wt 215.8 lb

## 2020-02-01 DIAGNOSIS — C221 Intrahepatic bile duct carcinoma: Secondary | ICD-10-CM | POA: Diagnosis not present

## 2020-02-01 DIAGNOSIS — Z5111 Encounter for antineoplastic chemotherapy: Secondary | ICD-10-CM | POA: Diagnosis not present

## 2020-02-01 DIAGNOSIS — Z87891 Personal history of nicotine dependence: Secondary | ICD-10-CM | POA: Insufficient documentation

## 2020-02-01 DIAGNOSIS — E119 Type 2 diabetes mellitus without complications: Secondary | ICD-10-CM | POA: Diagnosis not present

## 2020-02-01 DIAGNOSIS — K8689 Other specified diseases of pancreas: Secondary | ICD-10-CM

## 2020-02-01 DIAGNOSIS — I10 Essential (primary) hypertension: Secondary | ICD-10-CM | POA: Insufficient documentation

## 2020-02-01 DIAGNOSIS — Z794 Long term (current) use of insulin: Secondary | ICD-10-CM | POA: Diagnosis not present

## 2020-02-01 DIAGNOSIS — C787 Secondary malignant neoplasm of liver and intrahepatic bile duct: Secondary | ICD-10-CM | POA: Insufficient documentation

## 2020-02-01 DIAGNOSIS — Z79899 Other long term (current) drug therapy: Secondary | ICD-10-CM | POA: Insufficient documentation

## 2020-02-01 HISTORY — PX: IR IMAGING GUIDED PORT INSERTION: IMG5740

## 2020-02-01 LAB — CBC WITH DIFFERENTIAL (CANCER CENTER ONLY)
Abs Immature Granulocytes: 0.02 10*3/uL (ref 0.00–0.07)
Basophils Absolute: 0.1 10*3/uL (ref 0.0–0.1)
Basophils Relative: 2 %
Eosinophils Absolute: 0.8 10*3/uL — ABNORMAL HIGH (ref 0.0–0.5)
Eosinophils Relative: 8 %
HCT: 42.5 % (ref 39.0–52.0)
Hemoglobin: 14.1 g/dL (ref 13.0–17.0)
Immature Granulocytes: 0 %
Lymphocytes Relative: 16 %
Lymphs Abs: 1.5 10*3/uL (ref 0.7–4.0)
MCH: 29.5 pg (ref 26.0–34.0)
MCHC: 33.2 g/dL (ref 30.0–36.0)
MCV: 88.9 fL (ref 80.0–100.0)
Monocytes Absolute: 0.7 10*3/uL (ref 0.1–1.0)
Monocytes Relative: 8 %
Neutro Abs: 6.1 10*3/uL (ref 1.7–7.7)
Neutrophils Relative %: 66 %
Platelet Count: 220 10*3/uL (ref 150–400)
RBC: 4.78 MIL/uL (ref 4.22–5.81)
RDW: 12.9 % (ref 11.5–15.5)
WBC Count: 9.2 10*3/uL (ref 4.0–10.5)
nRBC: 0 % (ref 0.0–0.2)

## 2020-02-01 LAB — CMP (CANCER CENTER ONLY)
ALT: 44 U/L (ref 0–44)
AST: 51 U/L — ABNORMAL HIGH (ref 15–41)
Albumin: 3.3 g/dL — ABNORMAL LOW (ref 3.5–5.0)
Alkaline Phosphatase: 343 U/L — ABNORMAL HIGH (ref 38–126)
Anion gap: 6 (ref 5–15)
BUN: 9 mg/dL (ref 6–20)
CO2: 30 mmol/L (ref 22–32)
Calcium: 9.7 mg/dL (ref 8.9–10.3)
Chloride: 96 mmol/L — ABNORMAL LOW (ref 98–111)
Creatinine: 1.03 mg/dL (ref 0.61–1.24)
GFR, Estimated: >60 mL/min (ref >60)
Glucose, Bld: 390 mg/dL — ABNORMAL HIGH (ref 70–99)
Potassium: 4.2 mmol/L (ref 3.5–5.1)
Sodium: 132 mmol/L — ABNORMAL LOW (ref 135–145)
Total Bilirubin: 1 mg/dL (ref 0.3–1.2)
Total Protein: 8 g/dL (ref 6.5–8.1)

## 2020-02-01 LAB — GLUCOSE, CAPILLARY: Glucose-Capillary: 233 mg/dL — ABNORMAL HIGH (ref 70–99)

## 2020-02-01 LAB — MAGNESIUM: Magnesium: 1.8 mg/dL (ref 1.7–2.4)

## 2020-02-01 MED ORDER — SODIUM CHLORIDE 0.9 % IV SOLN: INTRAVENOUS | Status: DC

## 2020-02-01 MED ORDER — MIDAZOLAM HCL 2 MG/2ML IJ SOLN
INTRAMUSCULAR | Status: AC | PRN
  Administered 2020-02-01: 1.5 mg via INTRAVENOUS

## 2020-02-01 MED ORDER — SODIUM CHLORIDE 0.9 % IV SOLN
INTRAVENOUS | Status: AC | PRN
  Administered 2020-02-01: 10 mL/h via INTRAVENOUS

## 2020-02-01 MED ORDER — LIDOCAINE-EPINEPHRINE 1 %-1:100000 IJ SOLN
INTRAMUSCULAR | Status: AC | PRN
  Administered 2020-02-01: 20 mL

## 2020-02-01 MED ORDER — FENTANYL CITRATE (PF) 100 MCG/2ML IJ SOLN
INTRAMUSCULAR | Status: AC
  Filled 2020-02-01: qty 2

## 2020-02-01 MED ORDER — HEPARIN SOD (PORK) LOCK FLUSH 100 UNIT/ML IV SOLN
INTRAVENOUS | Status: AC
  Filled 2020-02-01: qty 5

## 2020-02-01 MED ORDER — MIDAZOLAM HCL 2 MG/2ML IJ SOLN
INTRAMUSCULAR | Status: AC
  Filled 2020-02-01: qty 2

## 2020-02-01 MED ORDER — CEFAZOLIN SODIUM-DEXTROSE 2-4 GM/100ML-% IV SOLN
INTRAVENOUS | Status: AC
  Administered 2020-02-01: 2 g via INTRAVENOUS
  Filled 2020-02-01: qty 100

## 2020-02-01 MED ORDER — CEFAZOLIN SODIUM-DEXTROSE 2-4 GM/100ML-% IV SOLN: 2.0000 g | INTRAVENOUS | Status: AC

## 2020-02-01 MED ORDER — LIDOCAINE-EPINEPHRINE 1 %-1:100000 IJ SOLN
INTRAMUSCULAR | Status: AC
  Filled 2020-02-01: qty 1

## 2020-02-01 MED ORDER — FENTANYL CITRATE (PF) 100 MCG/2ML IJ SOLN
INTRAMUSCULAR | Status: AC | PRN
  Administered 2020-02-01: 50 ug via INTRAVENOUS

## 2020-02-01 NOTE — Discharge Instructions (Addendum)
Implanted Port Insertion, Care After This sheet gives you information about how to care for yourself after your procedure. Your health care provider may also give you more specific instructions. If you have problems or questions, contact your health care provider. What can I expect after the procedure? After the procedure, it is common to have:  Discomfort at the port insertion site.  Bruising on the skin over the port. This should improve over 3-4 days. Follow these instructions at home: Port care  After your port is placed, you will get a manufacturer's information card. The card has information about your port. Keep this card with you at all times.  Take care of the port as told by your health care provider. Ask your health care provider if you or a family member can get training for taking care of the port at home. A home health care nurse may also take care of the port.  Make sure to remember what type of port you have. Incision care      Follow instructions from your health care provider about how to take care of your port insertion site. Make sure you: ? Wash your hands with soap and water before and after you change your bandage (dressing). If soap and water are not available, use hand sanitizer. ? Change your dressing as told by your health care provider. ? Leave stitches (sutures), skin glue, or adhesive strips in place. These skin closures may need to stay in place for 2 weeks or longer. If adhesive strip edges start to loosen and curl up, you may trim the loose edges. Do not remove adhesive strips completely unless your health care provider tells you to do that.  Check your port insertion site every day for signs of infection. Check for: ? Redness, swelling, or pain. ? Fluid or blood. ? Warmth. ? Pus or a bad smell. Activity  Return to your normal activities as told by your health care provider. Ask your health care provider what activities are safe for you.  Do not  lift anything that is heavier than 10 lb (4.5 kg), or the limit that you are told, until your health care provider says that it is safe. General instructions  Take over-the-counter and prescription medicines only as told by your health care provider.  Do not take baths, swim, or use a hot tub until your health care provider approves. Ask your health care provider if you may take showers. You may only be allowed to take sponge baths.  Do not drive for 24 hours if you were given a sedative during your procedure.  Wear a medical alert bracelet in case of an emergency. This will tell any health care providers that you have a port.  Keep all follow-up visits as told by your health care provider. This is important. Contact a health care provider if:  You cannot flush your port with saline as directed, or you cannot draw blood from the port.  You have a fever or chills.  You have redness, swelling, or pain around your port insertion site.  You have fluid or blood coming from your port insertion site.  Your port insertion site feels warm to the touch.  You have pus or a bad smell coming from the port insertion site. Get help right away if:  You have chest pain or shortness of breath.  You have bleeding from your port that you cannot control. Summary  Take care of the port as told by your health   care provider. Keep the manufacturer's information card with you at all times.  Change your dressing as told by your health care provider.  Contact a health care provider if you have a fever or chills or if you have redness, swelling, or pain around your port insertion site.  Keep all follow-up visits as told by your health care provider. This information is not intended to replace advice given to you by your health care provider. Make sure you discuss any questions you have with your health care provider. Document Revised: 08/19/2017 Document Reviewed: 08/19/2017 [employer] Patient Education   2020 [employer] Moderate Conscious Sedation, Adult Sedation is the use of medicines to promote relaxation and relieve discomfort and anxiety. Moderate conscious sedation is a type of sedation. Under moderate conscious sedation, you are less alert than normal, but you are still able to respond to instructions, touch, or both. Moderate conscious sedation is used during short medical and dental procedures. It is milder than deep sedation, which is a type of sedation under which you cannot be easily woken up. It is also milder than general anesthesia, which is the use of medicines to make you unconscious. Moderate conscious sedation allows you to return to your regular activities sooner. Tell a health care provider about:  Any allergies you have.  All medicines you are taking, including vitamins, herbs, eye drops, creams, and over-the-counter medicines.  Use of steroids (by mouth or creams).  Any problems you or family members have had with sedatives and anesthetic medicines.  Any blood disorders you have.  Any surgeries you have had.  Any medical conditions you have, such as sleep apnea.  Whether you are pregnant or may be pregnant.  Any use of cigarettes, alcohol, marijuana, or street drugs. What are the risks? Generally, this is a safe procedure. However, problems may occur, including:  Getting too much medicine (oversedation).  Nausea.  Allergic reaction to medicines.  Trouble breathing. If this happens, a breathing tube may be used to help with breathing. It will be removed when you are awake and breathing on your own.  Heart trouble.  Lung trouble. What happens before the procedure? Staying hydrated Follow instructions from your health care provider about hydration, which may include:  Up to 2 hours before the procedure - you may continue to drink clear liquids, such as water, clear fruit juice, black coffee, and plain tea. Eating and drinking restrictions Follow  instructions from your health care provider about eating and drinking, which may include:  8 hours before the procedure - stop eating heavy meals or foods such as meat, fried foods, or fatty foods.  6 hours before the procedure - stop eating light meals or foods, such as toast or cereal.  6 hours before the procedure - stop drinking milk or drinks that contain milk.  2 hours before the procedure - stop drinking clear liquids. Medicine Ask your health care provider about:  Changing or stopping your regular medicines. This is especially important if you are taking diabetes medicines or blood thinners.  Taking medicines such as aspirin and ibuprofen. These medicines can thin your blood. Do not take these medicines before your procedure if your health care provider instructs you not to.  Tests and exams  You will have a physical exam.  You may have blood tests done to show: ? How well your kidneys and liver are working. ? How well your blood can clot. General instructions  Plan to have someone take you home from the   hospital or clinic.  If you will be going home right after the procedure, plan to have someone with you for 24 hours. What happens during the procedure?  An IV tube will be inserted into one of your veins.  Medicine to help you relax (sedative) will be given through the IV tube.  The medical or dental procedure will be performed. What happens after the procedure?  Your blood pressure, heart rate, breathing rate, and blood oxygen level will be monitored often until the medicines you were given have worn off.  Do not drive for 24 hours. This information is not intended to replace advice given to you by your health care provider. Make sure you discuss any questions you have with your health care provider. Document Revised: 01/03/2017 Document Reviewed: 05/13/2015 [employer] Patient Education  2020 [employer]  

## 2020-02-01 NOTE — H&P (Signed)
Chief Complaint: Patient was seen in consultation today for port-a-catheter placement  Referring Physician(s): Ladell Pier  Supervising Physician: Jacqulynn Cadet  Patient Status: [facility] - Out-pt  History of Present Illness: Matthew Osborne is a 44 y.o. male with a medical history significant for DM and HTN. He developed a cough, dyspnea with exertion and unintentional weight loss earlier this year. His work up eventually led to CT imaging which revealed pancreatic and liver lesions concerning for metastatic disease. He underwent a liver biopsy in IR on 01/19/20 with pathology revealing adenocarcinoma. He will begin chemotherapy soon for the treatment of intrahepatic cholangiocarcinoma.   Interventional Radiology has been asked to evaluate this patient for an image-guided port-a-catheter placement to facilitate his treatment plans.   Past Medical History:  Diagnosis Date  . De Quervain's tenosynovitis, left 03/2011  . Diabetes mellitus    NIDDM  . Hyperlipidemia   . Hypertension    under control; has been on med. x 4 yrs.  . Nonproliferative retinopathy due to secondary diabetes [facility])     Past Surgical History:  Procedure Laterality Date  . CARPAL TUNNEL RELEASE     bilat.  . DORSAL COMPARTMENT RELEASE  03/14/2011   Procedure: RELEASE DORSAL COMPARTMENT (DEQUERVAIN);  Surgeon: Paulene Floor, MD;  Location: [place];  Service: Orthopedics;  Laterality: Left;  left wrist APL and EPL tenosynovectomy and 1st dorsal compartment release  . TONSILLECTOMY AND ADENOIDECTOMY  as a child  . VASECTOMY      Allergies: Niaspan [niacin]  Medications: Prior to Admission medications   Medication Sig Start Date End Date Taking? Authorizing Provider  bisoprolol-hydrochlorothiazide (ZIAC) 10-6.25 MG tablet TAKE 1 TABLET BY MOUTH EVERY DAY IN THE MORNING Patient taking differently: Take 1 tablet by mouth daily. 12/09/19  Yes BranchAlphonse Guild, MD   chlorpheniramine-HYDROcodone (TUSSIONEX PENNKINETIC ER) 10-8 MG/5ML SUER Take 5 mLs by mouth every 12 (twelve) hours as needed for cough. 01/13/20  Yes Susy Frizzle, MD  traMADol (ULTRAM) 50 MG tablet Take 1 tablet (50 mg total) by mouth every 6 (six) hours as needed for moderate pain (cough). 01/21/20  Yes Ladell Pier, MD  blood glucose meter kit and supplies KIT Fasting prior to each meal three times a day 08/25/19   Ishmael Holter A, FNP  glucose blood test strip Dispense based on patient and insurance preference.  Use twice daily as directed. (FOR ICD-10 E11.65) 08/29/17   Susy Frizzle, MD  insulin glargine (LANTUS) 100 UNIT/ML injection Inject 0.4 mLs (40 Units total) into the skin daily. 01/31/20   Susy Frizzle, MD  insulin regular (NOVOLIN R) 100 units/mL injection Check fasting blood sugar just prior to eating breakfast, lunch, and dinner Based on your blood sugar take insulin as directed: BG 150-200 take 2 units BG 201-250 take 4 units BG 251-300 take 6 units BG 301-350 take 8 units BG 351-400 take 10 units BG >401 take 12 units and call doctor 01/31/20   Susy Frizzle, MD  Insulin Syringe 27G X 1/2" 0.5 ML MISC Use as directed to inject insulin SQ Q1D. 01/31/20   Susy Frizzle, MD  Lancets Thin MISC Check BS BID 08/29/17   Susy Frizzle, MD  lidocaine-prilocaine (EMLA) cream Apply 1 application topically as directed. Apply to port site 1-2 hours prior to stick and cover with plastic wrap. 01/26/20   Ladell Pier, MD  ondansetron (ZOFRAN) 8 MG tablet Take 1 tablet (8 mg total)  by mouth every 8 (eight) hours as needed for nausea or vomiting. Begin 72 hours after IV chemotherapy treatment 01/26/20   Ladell Pier, MD  prochlorperazine (COMPAZINE) 10 MG tablet Take 1 tablet (10 mg total) by mouth every 6 (six) hours as needed for nausea. 01/26/20   Ladell Pier, MD  TRUEPLUS LANCETS 33G MISC 1 each by Does not apply route 2 (two) times daily. 07/06/13    Susy Frizzle, MD     Family History  Problem Relation Age of Onset  . Cancer Other   . Hyperlipidemia Other   . Hypertension Brother   . Asthma Son   . Lung cancer Maternal Grandfather   . Rheum arthritis Maternal Grandmother     Social History   Socioeconomic History  . Marital status: Married    Spouse name: Not on file  . Number of children: Not on file  . Years of education: Not on file  . Highest education level: Not on file  Occupational History  . Not on file  Tobacco Use  . Smoking status: Former Smoker    Packs/day: 3.00    Years: 6.00    Pack years: 18.00    Types: Cigarettes    Quit date: 02/05/1999    Years since quitting: 21.0  . Smokeless tobacco: Never Used  Vaping Use  . Vaping Use: Never used  Substance and Sexual Activity  . Alcohol use: No    Alcohol/week: 0.0 standard drinks  . Drug use: No  . Sexual activity: Not on file  Other Topics Concern  . Not on file  Social History Narrative  . Not on file   Social Determinants of Health   Financial Resource Strain: Low Risk   . Difficulty of Paying Living Expenses: Not hard at all  Food Insecurity: No Food Insecurity  . Worried About Charity fundraiser in the Last Year: Never true  . Ran Out of Food in the Last Year: Never true  Transportation Needs: No Transportation Needs  . Lack of Transportation (Medical): No  . Lack of Transportation (Non-Medical): No  Physical Activity: Not on file  Stress: Stress Concern Present  . Feeling of Stress : To some extent  Social Connections: Socially Integrated  . Frequency of Communication with Friends and Family: More than three times a week  . Frequency of Social Gatherings with Friends and Family: More than three times a week  . Attends Religious Services: More than 4 times per year  . Active Member of Clubs or Organizations: Yes  . Attends Archivist Meetings: More than 4 times per year  . Marital Status: Married    Review of Systems:  A 12 point ROS discussed and pertinent positives are indicated in the HPI above.  All other systems are negative.  Review of Systems  Constitutional: Positive for fatigue.  Respiratory: Positive for cough. Negative for shortness of breath.   Cardiovascular: Negative for chest pain and leg swelling.  Gastrointestinal: Positive for abdominal pain. Negative for diarrhea, nausea and vomiting.  Musculoskeletal: Positive for back pain.  Neurological: Negative for headaches.    Vital Signs: BP (!) 125/92   Pulse 72   Temp 98.4 F (36.9 C) (Oral)   Ht _0  (1.88 m)   Wt 209 lb (94.8 kg)   SpO2 100%   BMI 26.83 kg/m   Physical Exam Constitutional:      General: He is not in acute distress. HENT:     Mouth/Throat:  Mouth: Mucous membranes are moist.     Pharynx: Oropharynx is clear.  Cardiovascular:     Rate and Rhythm: Normal rate and regular rhythm.     Pulses: Normal pulses.     Heart sounds: Normal heart sounds.  Pulmonary:     Effort: Pulmonary effort is normal.     Breath sounds: Normal breath sounds.  Abdominal:     General: Bowel sounds are normal.     Palpations: Abdomen is soft.     Tenderness: There is abdominal tenderness.     Comments: Upper abdominal  Musculoskeletal:        General: Normal range of motion.  Skin:    General: Skin is warm and dry.  Neurological:     Mental Status: He is alert and oriented to person, place, and time.  Psychiatric:        Mood and Affect: Mood normal.        Behavior: Behavior normal.        Thought Content: Thought content normal.        Judgment: Judgment normal.     Imaging: CT Chest W Contrast  Result Date: 01/13/2020 CLINICAL DATA:  Unintended weight loss, elevated LFT EXAM: CT CHEST, ABDOMEN, AND PELVIS WITH CONTRAST TECHNIQUE: Multidetector CT imaging of the chest, abdomen and pelvis was performed following the standard protocol during bolus administration of intravenous contrast. CONTRAST:  133m OMNIPAQUE  IOHEXOL 300 MG/ML  SOLN COMPARISON:  CT chest angiogram, 01/18/2013 FINDINGS: CT CHEST FINDINGS Cardiovascular: Left and right coronary artery calcifications and/or stents. Normal heart size. No pericardial effusion. Mediastinum/Nodes: No enlarged mediastinal, hilar, or axillary lymph nodes. Thyroid gland, trachea, and esophagus demonstrate no significant findings. Lungs/Pleura: Lungs are clear. No pleural effusion or pneumothorax. Musculoskeletal: No chest wall mass or suspicious bone lesions identified. CT ABDOMEN PELVIS FINDINGS Hepatobiliary: Multiple rim enhancing masses of the liver, primarily in the left lobe, with atrophy of the left lobe and segmental dilatation of the left lobe intrahepatic bile ducts (series 4, image 16). The largest index mass of the anterior left lobe of the liver measures 3.4 x 2.9 cm (series 4, image 21). There is very ill-defined hypodensity of the central liver with effacement of the left portal vein (series 4, image 17). No gallstones, gallbladder wall thickening, or biliary dilatation. Pancreas: There is a rounded, hypodense mass of the pancreatic neck measuring 4.4 x 3.4 cm no pancreatic ductal dilatation or surrounding inflammatory changes. Spleen: Normal in size without significant abnormality. Adrenals/Urinary Tract: Adrenal glands are unremarkable. Kidneys are normal, without renal calculi, solid lesion, or hydronephrosis. Bladder is unremarkable. Stomach/Bowel: Stomach is within normal limits. Appendix appears normal. No evidence of bowel wall thickening, distention, or inflammatory changes. Vascular/Lymphatic: No significant vascular findings are present. Enlarged portacaval lymph nodes measuring up to 4.5 x 3.0 cm (series 4, image 24). Numerous enlarged retroperitoneal lymph nodes measuring up to 1.9 x 1.1 cm (series 4, image 34). Reproductive: No mass or other abnormality. Other: No abdominal wall hernia or abnormality. No abdominopelvic ascites. Musculoskeletal: No acute  or significant osseous findings. IMPRESSION: 1. There is a rounded, hypodense mass which appears to arise from the pancreatic neck measuring 4.4 x 3.4 cm. 2. Multiple rim enhancing masses of the liver, primarily in the left lobe, with atrophy of the left lobe and segmental dilatation of the left lobe intrahepatic bile ducts. 3. There is very ill-defined hypodensity of the central liver with effacement of the left portal vein. Findings are consistent with metastatic disease.  4. Numerous enlarged portacaval and retroperitoneal lymph nodes. 5. Constellation of findings above most likely reflects primary pancreatic adenocarcinoma with hepatic and nodal metastatic disease, although ill-defined hypodensity of the central liver and portal vein effacement with biliary obstruction raises a significant differential consideration of cholangiocarcinoma and associated metastatic disease. Suspected primary mass of the pancreatic neck may therefore reflect a nodal metastasis which does not truly arise from the pancreatic parenchyma. Multiphasic contrast enhanced MRI may be helpful to better assess and characterize disease. 6. No evidence of distant metastatic disease in the chest. 7. Coronary artery disease. These results will be called to the ordering clinician or representative by the Radiologist Assistant, and communication documented in the PACS or [employer]. Electronically Signed   By: Eddie Candle M.D.   On: 01/13/2020 16:40   MR Abdomen W Wo Contrast  Result Date: 01/31/2020 CLINICAL DATA:  Multiple liver masses and possible pancreatic mass on recent CT study performed for weight loss and abnormal liver function tests. Adenocarcinoma on left liver mass biopsy performed 01/19/2020. EXAM: MRI ABDOMEN WITHOUT AND WITH CONTRAST TECHNIQUE: Multiplanar multisequence MR imaging of the abdomen was performed both before and after the administration of intravenous contrast. CONTRAST:  46m MULTIHANCE GADOBENATE  DIMEGLUMINE 529 MG/ML IV SOLN COMPARISON:  01/13/2020 CT chest, abdomen and pelvis. FINDINGS: Lower chest: No acute abnormality at the lung bases. Hepatobiliary: There is a poorly marginated 11.6 x 6.3 cm central liver mass (series 7/image 12) with heterogeneous progressive enhancement and mild T2 hyperintensity, predominantly involving the liver segments 7, 8 and 4A. There are numerous (at least 8) smaller similar satellite liver masses with heterogeneous enhancement, for example measuring 3.2 x 2.9 cm in segment 4A (series 7/image 16), 2.6 x 2.1 cm in segment 6 (series 7/image 18) and 1.6 x 1.6 cm in segment 7 (series 7/image 8). No hepatic steatosis. Normal gallbladder with no cholelithiasis. There is extrinsic mass-effect on the biliary hilum, with asymmetric prominent intrahepatic biliary ductal dilatation throughout the left liver lobe with mild intrahepatic biliary ductal dilatation in the superior right liver and sparing of the inferior right liver bile ducts. Common bile duct diameter 4 mm. No choledocholithiasis. Pancreas: No pancreatic mass or duct dilation.  No pancreas divisum. Spleen: Normal size. No mass. Adrenals/Urinary Tract: Normal adrenals. No hydronephrosis. Normal kidneys with no renal mass. Stomach/Bowel: Normal non-distended stomach. Visualized small and large bowel is normal caliber, with no bowel wall thickening. Vascular/Lymphatic: Normal caliber abdominal aorta. Patent portal, splenic, hepatic and renal veins. Numerous enlarged heterogeneously enhancing lymph nodes throughout the porta hepatis, peripancreatic, portacaval, aortocaval and left para-aortic chains. Representative 3.4 cm short axis diameter peripancreatic node (series 7/image 17) adjacent to the superior pancreatic body. Representative 3.1 cm porta hepatis node (series 7/image 17). Representative 2.8 cm portacaval node (series 7/image 23). Representative 1.5 cm aortocaval node (series 7/image 27). Representative 1.1 cm left  para-aortic node (series 7/image 27). Other: No abdominal ascites or focal fluid collection. Musculoskeletal: No aggressive appearing focal osseous lesions. IMPRESSION: 1. Large poorly marginated 11.6 x 6.3 cm central liver mass with heterogeneous progressive enhancement and intrahepatic biliary ductal dilatation asymmetrically involving the left liver lobe, most compatible with intrahepatic cholangiocarcinoma. Numerous (at least 8) satellite liver metastases. 2. Bulky metastatic adenopathy to the porta hepatis, portacaval, peripancreatic, aortocaval and left para-aortic chains. 3. Normal pancreas.  No pancreatic mass. Electronically Signed   By: JIlona SorrelM.D.   On: 01/31/2020 13:57   CT Abdomen Pelvis W Contrast  Result Date: 01/13/2020 CLINICAL DATA:  Unintended weight loss, elevated LFT EXAM: CT CHEST, ABDOMEN, AND PELVIS WITH CONTRAST TECHNIQUE: Multidetector CT imaging of the chest, abdomen and pelvis was performed following the standard protocol during bolus administration of intravenous contrast. CONTRAST:  152m OMNIPAQUE IOHEXOL 300 MG/ML  SOLN COMPARISON:  CT chest angiogram, 01/18/2013 FINDINGS: CT CHEST FINDINGS Cardiovascular: Left and right coronary artery calcifications and/or stents. Normal heart size. No pericardial effusion. Mediastinum/Nodes: No enlarged mediastinal, hilar, or axillary lymph nodes. Thyroid gland, trachea, and esophagus demonstrate no significant findings. Lungs/Pleura: Lungs are clear. No pleural effusion or pneumothorax. Musculoskeletal: No chest wall mass or suspicious bone lesions identified. CT ABDOMEN PELVIS FINDINGS Hepatobiliary: Multiple rim enhancing masses of the liver, primarily in the left lobe, with atrophy of the left lobe and segmental dilatation of the left lobe intrahepatic bile ducts (series 4, image 16). The largest index mass of the anterior left lobe of the liver measures 3.4 x 2.9 cm (series 4, image 21). There is very ill-defined hypodensity of the  central liver with effacement of the left portal vein (series 4, image 17). No gallstones, gallbladder wall thickening, or biliary dilatation. Pancreas: There is a rounded, hypodense mass of the pancreatic neck measuring 4.4 x 3.4 cm no pancreatic ductal dilatation or surrounding inflammatory changes. Spleen: Normal in size without significant abnormality. Adrenals/Urinary Tract: Adrenal glands are unremarkable. Kidneys are normal, without renal calculi, solid lesion, or hydronephrosis. Bladder is unremarkable. Stomach/Bowel: Stomach is within normal limits. Appendix appears normal. No evidence of bowel wall thickening, distention, or inflammatory changes. Vascular/Lymphatic: No significant vascular findings are present. Enlarged portacaval lymph nodes measuring up to 4.5 x 3.0 cm (series 4, image 24). Numerous enlarged retroperitoneal lymph nodes measuring up to 1.9 x 1.1 cm (series 4, image 34). Reproductive: No mass or other abnormality. Other: No abdominal wall hernia or abnormality. No abdominopelvic ascites. Musculoskeletal: No acute or significant osseous findings. IMPRESSION: 1. There is a rounded, hypodense mass which appears to arise from the pancreatic neck measuring 4.4 x 3.4 cm. 2. Multiple rim enhancing masses of the liver, primarily in the left lobe, with atrophy of the left lobe and segmental dilatation of the left lobe intrahepatic bile ducts. 3. There is very ill-defined hypodensity of the central liver with effacement of the left portal vein. Findings are consistent with metastatic disease. 4. Numerous enlarged portacaval and retroperitoneal lymph nodes. 5. Constellation of findings above most likely reflects primary pancreatic adenocarcinoma with hepatic and nodal metastatic disease, although ill-defined hypodensity of the central liver and portal vein effacement with biliary obstruction raises a significant differential consideration of cholangiocarcinoma and associated metastatic disease.  Suspected primary mass of the pancreatic neck may therefore reflect a nodal metastasis which does not truly arise from the pancreatic parenchyma. Multiphasic contrast enhanced MRI may be helpful to better assess and characterize disease. 6. No evidence of distant metastatic disease in the chest. 7. Coronary artery disease. These results will be called to the ordering clinician or representative by the Radiologist Assistant, and communication documented in the PACS or [employer] Electronically Signed   By: AEddie CandleM.D.   On: 01/13/2020 16:40   UK[place]CORE BIOPSY (LIVER)  Result Date: 01/19/2020 INDICATION: Liver masses, periportal and peripancreatic lymphadenopathy and possible pancreatic mass. EXAM: ULTRASOUND GUIDED CORE BIOPSY OF LIVER MASS MEDICATIONS: None. ANESTHESIA/SEDATION: Fentanyl 100 mcg IV; Versed 3.0 mg IV Moderate Sedation Time:  13 minutes. The patient was continuously monitored during the procedure by the interventional radiology nurse under my direct supervision. PROCEDURE: The procedure,  risks, benefits, and alternatives were explained to the patient. Questions regarding the procedure were encouraged and answered. The patient understands and consents to the procedure. A time-out was performed prior to initiating the procedure. Ultrasound was performed of the liver to localize lesions. The abdominal wall was prepped with chlorhexidine in a sterile fashion, and a sterile drape was applied covering the operative field. A sterile gown and sterile gloves were used for the procedure. Local anesthesia was provided with 1% Lidocaine. Under ultrasound guidance, a 17 gauge trocar needle was advanced into the left lobe of the liver. After confirming needle tip position, 3 separate coaxial 18 gauge core biopsy samples were obtained and submitted in formalin. Gel-Foam pledgets were advanced through the outer needle as the needle was retracted and removed. Additional ultrasound was performed.  COMPLICATIONS: None immediate. FINDINGS: Multiple ill-defined hypoechoic mass lesions are seen in the liver. There also is focal biliary ductal dilatation localizing to the lateral segment of the left lobe of the liver. An oval-shaped lesion in the left lobe of the liver was chosen for sampling and measures approximately 2.9 x 2.1 x 2.1 cm. Solid tissue was obtained. IMPRESSION: Ultrasound-guided core biopsy performed a mass within the left lobe of the liver measuring 2.9 cm in greatest diameter. Electronically Signed   By: Aletta Edouard M.D.   On: 01/19/2020 15:40    Labs:  CBC: Recent Labs    08/20/19 1019 09/10/19 1457 01/11/20 1046 01/19/20 1140  WBC 6.9 11.5* 11.3* 6.8  HGB 16.2 14.9 15.7 15.5  HCT 48.4 45.7 46.4 46.7  PLT 304 273 390 344    COAGS: Recent Labs    01/14/20 1215 01/19/20 1140  INR 1.0 1.1    BMP: Recent Labs    08/20/19 1019 09/10/19 1457 01/11/20 1046 01/14/20 1214 01/21/20 1419  NA 138 134* 128* 135 135  K 4.6 4.4 4.7 4.3 4.2  CL 101 98 92* 95* 98  CO2 _0 GLUCOSE 182* 374* 370* 246* 294*  BUN _1 CALCIUM 9.3 9.3 10.3 11.0* 9.5  CREATININE 1.01 1.10 0.78 1.06 1.03  GFRNONAA 90 81 110 >60 >60  GFRAA 104 94 127  --   --     LIVER FUNCTION TESTS: Recent Labs    09/10/19 1457 01/11/20 1046 01/14/20 1214 01/21/20 1419  BILITOT 0.5 0.9 0.9 0.6  AST 33 47* 41 55*  ALT 28 51* 47* 43  ALKPHOS  --   --  393* 392*  PROT 7.0 7.4 8.2* 7.8  ALBUMIN  --   --  3.3* 3.3*    TUMOR MARKERS: No results for input(s): AFPTM, CEA, CA199, CHROMGRNA in the last 8760 hours.  Assessment and Plan:  Intrahepatic cholangiocarcinoma: Matthew Osborne, 44 year old male, presents today to the [place] [facility] department for an image-guided port-a-catheter placement for chemotherapy access.   Risks and benefits of an image-guided port-a-catheter placement were discussed with the patient including, but not limited to  bleeding, infection, pneumothorax, or fibrin sheath development and need for additional procedures.  All of the patient's questions were answered, patient is agreeable to proceed.  He has been NPO. Labs and vitals have been reviewed.   Consent signed and in chart.  Thank you for this interesting consult.  I greatly enjoyed meeting Matthew Osborne and look forward to participating in their care.  A copy of this report was sent to the requesting provider on this date.  Electronically Signed:  Soyla Dryer, [place] [phone] 02/01/2020, 9:36 AM   I spent a total of 20 Minutes    in face to face in clinical consultation, greater than 50% of which was counseling/coordinating care for port-a-catheter placement.

## 2020-02-01 NOTE — Telephone Encounter (Signed)
Spoke with patient's wife Clydie Braun to inquire on his status after having port placed.  She states she just received a call that he is ready to be picked up and then they are proceeding to [facility] to be seen by Dr. Truett Perna.

## 2020-02-01 NOTE — Progress Notes (Signed)
START OFF PATHWAY REGIMEN - Other   [id number]:Cisplatin 25 mg/m2 D1,8 + Gemcitabine 1,000 mg/m2 D1,8 q21 Days:   A cycle is every 21 days:     Gemcitabine      Cisplatin   **Always confirm dose/schedule in your pharmacy ordering system**  Patient Characteristics: Intent of Therapy: Non-Curative / Palliative Intent, Discussed with Patient 

## 2020-02-01 NOTE — Procedures (Signed)
Interventional Radiology Procedure Note  Procedure: Placement of a right IJ approach single lumen PowerPort.  Tip is positioned at the superior cavoatrial junction and catheter is ready for immediate use.  Complications: No immediate Recommendations:  - Ok to shower tomorrow - Do not submerge for 7 days - Routine line care  - Cannot leave port accessed at discharge.  In the future, same day labs can be ordered and drawn during the procedure and processed through the [facility] lab, or port placement can take place at [facility] and left accessed while patient travels to the cancer center without leaving the hospital.    Signed,  Sterling Big, MD

## 2020-02-01 NOTE — Progress Notes (Signed)
ALERT: A disease instance has been permanently removed from this patient's pathway record and replaced with a new disease instance. Information on the new disease instance will be transmitted in a separate message.  Disease Being Removed: Pancreatic Adenocarcinoma  Reason for Removal: Previous diagnosis has morphed into a different diagnosis

## 2020-02-01 NOTE — Progress Notes (Signed)
[facility] OFFICE PROGRESS NOTE   Diagnosis: Cholangiocarcinoma  INTERVAL HISTORY:   Mr. Hann continues to have improvement in the cough.  He has developed "soreness "in the right upper abdomen and right lower back regions.  He is not taking pain medication.  He was able to travel out of town over the Christmas holiday.  He underwent Port-A-Cath placement earlier today.  He is here today with his wife. He has discomfort at the right neck and upper chest Port-A-Cath insertion site. Objective:  Vital signs in last 24 hours:  Blood pressure 139/86, pulse 70, temperature (!) 97 F (36.1 C), temperature source Tympanic, resp. rate 18, height 6\' 2"  (1.88 m), weight 215 lb 12.8 oz (97.9 kg), SpO2 98 %.   Resp: Lungs clear bilaterally Cardio: Regular rate and rhythm GI: No hepatosplenomegaly, nontender Vascular: No leg edema   Portacath/PICC-without erythema  Lab Results:  Lab Results  Component Value Date   WBC 9.2 02/01/2020   HGB 14.1 02/01/2020   HCT 42.5 02/01/2020   MCV 88.9 02/01/2020   PLT 220 02/01/2020   NEUTROABS 6.1 02/01/2020    CMP  Lab Results  Component Value Date   NA 135 01/21/2020   K 4.2 01/21/2020   CL 98 01/21/2020   CO2 30 01/21/2020   GLUCOSE 294 (H) 01/21/2020   BUN 9 01/21/2020   CREATININE 1.03 01/21/2020   CALCIUM 9.5 01/21/2020   PROT 7.8 01/21/2020   ALBUMIN 3.3 (L) 01/21/2020   AST 55 (H) 01/21/2020   ALT 43 01/21/2020   ALKPHOS 392 (H) 01/21/2020   BILITOT 0.6 01/21/2020   GFRNONAA >60 01/21/2020   GFRAA 127 01/11/2020    Lab Results  Component Value Date   CEA1 2.15 01/14/2020    Imaging:  MR Abdomen W Wo Contrast  Result Date: 01/31/2020 CLINICAL DATA:  Multiple liver masses and possible pancreatic mass on recent CT study performed for weight loss and abnormal liver function tests. Adenocarcinoma on left liver mass biopsy performed 01/19/2020. EXAM: MRI ABDOMEN WITHOUT AND WITH CONTRAST TECHNIQUE:  Multiplanar multisequence MR imaging of the abdomen was performed both before and after the administration of intravenous contrast. CONTRAST:  24mL MULTIHANCE GADOBENATE DIMEGLUMINE 529 MG/ML IV SOLN COMPARISON:  01/13/2020 CT chest, abdomen and pelvis. FINDINGS: Lower chest: No acute abnormality at the lung bases. Hepatobiliary: There is a poorly marginated 11.6 x 6.3 cm central liver mass (series 7/image 12) with heterogeneous progressive enhancement and mild T2 hyperintensity, predominantly involving the liver segments 7, 8 and 4A. There are numerous (at least 8) smaller similar satellite liver masses with heterogeneous enhancement, for example measuring 3.2 x 2.9 cm in segment 4A (series 7/image 16), 2.6 x 2.1 cm in segment 6 (series 7/image 18) and 1.6 x 1.6 cm in segment 7 (series 7/image 8). No hepatic steatosis. Normal gallbladder with no cholelithiasis. There is extrinsic mass-effect on the biliary hilum, with asymmetric prominent intrahepatic biliary ductal dilatation throughout the left liver lobe with mild intrahepatic biliary ductal dilatation in the superior right liver and sparing of the inferior right liver bile ducts. Common bile duct diameter 4 mm. No choledocholithiasis. Pancreas: No pancreatic mass or duct dilation.  No pancreas divisum. Spleen: Normal size. No mass. Adrenals/Urinary Tract: Normal adrenals. No hydronephrosis. Normal kidneys with no renal mass. Stomach/Bowel: Normal non-distended stomach. Visualized small and large bowel is normal caliber, with no bowel wall thickening. Vascular/Lymphatic: Normal caliber abdominal aorta. Patent portal, splenic, hepatic and renal veins. Numerous enlarged heterogeneously enhancing lymph  nodes throughout the porta hepatis, peripancreatic, portacaval, aortocaval and left para-aortic chains. Representative 3.4 cm short axis diameter peripancreatic node (series 7/image 17) adjacent to the superior pancreatic body. Representative 3.1 cm porta hepatis  node (series 7/image 17). Representative 2.8 cm portacaval node (series 7/image 23). Representative 1.5 cm aortocaval node (series 7/image 27). Representative 1.1 cm left para-aortic node (series 7/image 27). Other: No abdominal ascites or focal fluid collection. Musculoskeletal: No aggressive appearing focal osseous lesions. IMPRESSION: 1. Large poorly marginated 11.6 x 6.3 cm central liver mass with heterogeneous progressive enhancement and intrahepatic biliary ductal dilatation asymmetrically involving the left liver lobe, most compatible with intrahepatic cholangiocarcinoma. Numerous (at least 8) satellite liver metastases. 2. Bulky metastatic adenopathy to the porta hepatis, portacaval, peripancreatic, aortocaval and left para-aortic chains. 3. Normal pancreas.  No pancreatic mass. Electronically Signed   By: Ilona Sorrel M.D.   On: 01/31/2020 13:57   IR IMAGING GUIDED PORT INSERTION  Result Date: 02/01/2020 INDICATION: 44 year old male with recently diagnosed cholangiocarcinoma. He requires durable venous access for initiation of chemotherapy. EXAM: IMPLANTED PORT A CATH PLACEMENT WITH ULTRASOUND AND FLUOROSCOPIC GUIDANCE MEDICATIONS: 2 g Ancef; The antibiotic was administered within an appropriate time interval prior to skin puncture. ANESTHESIA/SEDATION: Versed 1.5 mg IV; Fentanyl 50 mcg IV; Moderate Sedation Time:  32 minutes The patient was continuously monitored during the procedure by the interventional radiology nurse under my direct supervision. FLUOROSCOPY TIME:  0 minutes, 18 seconds (2 mGy) COMPLICATIONS: None immediate. PROCEDURE: The right neck and chest was prepped with chlorhexidine, and draped in the usual sterile fashion using maximum barrier technique (cap and mask, sterile gown, sterile gloves, large sterile sheet, hand hygiene and cutaneous antiseptic). Local anesthesia was attained by infiltration with 1% lidocaine with epinephrine. Ultrasound demonstrated patency of the right  internal jugular vein, and this was documented with an image. Under real-time ultrasound guidance, this vein was accessed with a 21 gauge micropuncture needle and image documentation was performed. A small dermatotomy was made at the access site with an 11 scalpel. A 0.018" wire was advanced into the SVC and the access needle exchanged for a 48F micropuncture vascular sheath. The 0.018" wire was then removed and a 0.035" wire advanced into the IVC. An appropriate location for the subcutaneous reservoir was selected below the clavicle and an incision was made through the skin and underlying soft tissues. The subcutaneous tissues were then dissected using a combination of blunt and sharp surgical technique and a pocket was formed. A single lumen power injectable portacatheter was then tunneled through the subcutaneous tissues from the pocket to the dermatotomy and the port reservoir placed within the subcutaneous pocket. The venous access site was then serially dilated and a peel away vascular sheath placed over the wire. The wire was removed and the port catheter advanced into position under fluoroscopic guidance. The catheter tip is positioned in the superior cavoatrial junction. This was documented with a spot image. The portacatheter was then tested and found to flush and aspirate well. The port was flushed with saline followed by 100 units/mL heparinized saline. The pocket was then closed in two layers using first subdermal inverted interrupted absorbable sutures followed by a running subcuticular suture. The epidermis was then sealed with Dermabond. The dermatotomy at the venous access site was also closed with Dermabond. IMPRESSION: Successful placement of a right IJ approach Power Port with ultrasound and fluoroscopic guidance. The catheter is ready for use. Electronically Signed   By: Jacqulynn Cadet M.D.   On:  02/01/2020 14:10    Medications: I have reviewed the patient's current  medications.   Assessment/Plan: 1. Cholangiocarcinoma  multiple liver masses and abdominal lymphadenopathy  CTs 01/13/2020-rounded hypodense mass appears to arise from the pancreas neck, multiple rim-enhancing masses in the liver, primarily left liver with segmental dilation of the left lobe Intermatic bile ducts, ill-defined hypodensity the central liver with effacement of the left portal vein, enlarged portacaval and retroperitoneal lymph nodes  Ultrasound-guided biopsy of the left liver lesion 01/19/2020-adenocarcinoma, cytokeratin 7+  MRI abdomen 01/31/2020-poorly marginated central liver mass, multiple smaller similar satellite liver masses, extrinsic mass-effect at the biliary hilum with intrahepatic biliary ductal dilatation throughout the left liver with mild Intermatic dilatation the superior right liver, no pancreas mass or ductal dilatation, normal spleen size, numerous enlarged enhancing lymph nodes at the porta hepatis, peripancreatic, portacaval, aortocaval, left periaortic chains  2. Cough-likely related to diaphragmatic irritation from #1 3. Anorexia/weight loss 4. Hypercalcemia-likely hypercalcemia malignancy, status post intravenous hydration and Zometa 01/14/2020 5. Diabetes 6. Hyperlipidemia 7. Hypertension 8. History of peripheral neuropathy secondary to diabetes    Disposition: Mr. Alleyne appears stable.  He underwent Port-A-Cath placement earlier today.  His case was discussed at the GI tumor conference last week.  He underwent an abdomen MRI yesterday.  The MRI does not reveal a pancreas mass.  The MRI is most consistent with a diagnosis of intrahepatic cholangiocarcinoma with metastatic disease involving abdominal/retroperitoneal lymph nodes. I discussed the diagnosis and treatment options with Mr. Fallaw and his wife.  He is not be a candidate for surgical resection.  I recommend treatment with systemic therapy.  The plan was to begin FOLFIRINOX therapy tomorrow when  we felt the diagnosis of most likely pancreas cancer.  I recommend changing to gemcitabine/cisplatin.  He may be a candidate for hepatic directed or targeted therapies in the future.  We are waiting on results of Foundation 1 testing.  We reviewed potential toxicities associated with the gemcitabine/cisplatin regimen including the chance for nausea/vomiting, alopecia, and hematologic toxicity.  We discussed the rash, fever, and pneumonitis associated with gemcitabine.  We discussed the nephrotoxicity, ototoxicity, and neuropathy seen with cisplatin.  He agrees to proceed.  The plan is to schedule day 1 chemotherapy for 02/04/2020.  He would like to then switch treatments to Mondays.  He will return for cycle 1 day 8 on 02/14/2020.  A chemotherapy plan was entered today.   Betsy Coder, MD  02/01/2020  3:39 PM

## 2020-02-02 ENCOUNTER — Other Ambulatory Visit: Payer: No Typology Code available for payment source

## 2020-02-02 ENCOUNTER — Inpatient Hospital Stay: Payer: No Typology Code available for payment source | Admitting: Oncology

## 2020-02-02 ENCOUNTER — Inpatient Hospital Stay: Payer: No Typology Code available for payment source

## 2020-02-02 ENCOUNTER — Telehealth: Payer: Self-pay | Admitting: Oncology

## 2020-02-02 ENCOUNTER — Ambulatory Visit: Payer: No Typology Code available for payment source | Admitting: Oncology

## 2020-02-02 ENCOUNTER — Encounter: Payer: Self-pay | Admitting: Family Medicine

## 2020-02-02 ENCOUNTER — Encounter: Payer: Self-pay | Admitting: General Practice

## 2020-02-02 LAB — CANCER ANTIGEN 19-9: CA 19-9: 60 U/mL — ABNORMAL HIGH (ref 0–35)

## 2020-02-02 NOTE — Progress Notes (Signed)
Called patient's wife Matthew Osborne to let her know the new treatment plan is still under insurance review so cancelling the treatment for today.  She verbalized an understanding.  Treatment is rescheduled for Friday 02/04/2020 at 7:30

## 2020-02-02 NOTE — Progress Notes (Signed)
[facility] Spiritual Care Note  Attempted to meet Matthew Osborne in infusion, but treatment was rescheduled. Matthew Osborne and his wife Matthew Osborne by phone instead, introducing Spiritual Care services and [facility] programming as part of their support network. They report "taking it one day at a time" and doing well at the moment; no questions at this time, but are looking at yoga and other programming options. We plan for me to follow up in infusion at [name]'s 1/10 treatment.   [address] [place], [place], [facility] Pager [phone] Voicemail [phone]

## 2020-02-02 NOTE — Telephone Encounter (Signed)
Scheduled appointments per 12/28 los. Spoke to patient who is aware of appointments dates and times. Gave patient updated calendar.

## 2020-02-03 ENCOUNTER — Ambulatory Visit: Payer: No Typology Code available for payment source

## 2020-02-04 ENCOUNTER — Other Ambulatory Visit: Payer: Self-pay

## 2020-02-04 ENCOUNTER — Inpatient Hospital Stay: Payer: No Typology Code available for payment source

## 2020-02-04 VITALS — BP 138/84 | HR 67 | Temp 97.9°F | Resp 18

## 2020-02-04 DIAGNOSIS — Z5111 Encounter for antineoplastic chemotherapy: Secondary | ICD-10-CM | POA: Diagnosis not present

## 2020-02-04 DIAGNOSIS — C221 Intrahepatic bile duct carcinoma: Secondary | ICD-10-CM

## 2020-02-04 MED ORDER — PALONOSETRON HCL INJECTION 0.25 MG/5ML
0.2500 mg | Freq: Once | INTRAVENOUS | Status: AC
  Administered 2020-02-04: 0.25 mg via INTRAVENOUS

## 2020-02-04 MED ORDER — SODIUM CHLORIDE 0.9 % IV SOLN
Freq: Once | INTRAVENOUS | Status: AC
  Filled 2020-02-04: qty 250

## 2020-02-04 MED ORDER — FAMOTIDINE IN NACL 20-0.9 MG/50ML-% IV SOLN
20.0000 mg | Freq: Once | INTRAVENOUS | Status: AC
  Administered 2020-02-04: 20 mg via INTRAVENOUS

## 2020-02-04 MED ORDER — SODIUM CHLORIDE 0.9 % IV SOLN
Freq: Once | INTRAVENOUS | Status: DC
  Filled 2020-02-04: qty 250

## 2020-02-04 MED ORDER — SODIUM CHLORIDE 0.9% FLUSH
10.0000 mL | INTRAVENOUS | Status: DC | PRN
  Administered 2020-02-04: 10 mL
  Filled 2020-02-04: qty 10

## 2020-02-04 MED ORDER — MAGNESIUM SULFATE 2 GM/50ML IV SOLN: 2.0000 g | Freq: Once | INTRAVENOUS | Status: DC

## 2020-02-04 MED ORDER — PALONOSETRON HCL INJECTION 0.25 MG/5ML
INTRAVENOUS | Status: AC
  Filled 2020-02-04: qty 5

## 2020-02-04 MED ORDER — HEPARIN SOD (PORK) LOCK FLUSH 100 UNIT/ML IV SOLN
500.0000 [IU] | Freq: Once | INTRAVENOUS | Status: AC | PRN
  Administered 2020-02-04: 500 [IU]
  Filled 2020-02-04: qty 5

## 2020-02-04 MED ORDER — SODIUM CHLORIDE 0.9 % IV SOLN
150.0000 mg | Freq: Once | INTRAVENOUS | Status: AC
  Administered 2020-02-04: 150 mg via INTRAVENOUS
  Filled 2020-02-04: qty 150

## 2020-02-04 MED ORDER — SODIUM CHLORIDE 0.9 % IV SOLN
1000.0000 mg/m2 | Freq: Once | INTRAVENOUS | Status: AC
  Administered 2020-02-04: 2242 mg via INTRAVENOUS
  Filled 2020-02-04: qty 58.97

## 2020-02-04 MED ORDER — SODIUM CHLORIDE 0.9 % IV SOLN
10.0000 mg | Freq: Once | INTRAVENOUS | Status: AC
  Administered 2020-02-04: 10 mg via INTRAVENOUS
  Filled 2020-02-04: qty 10

## 2020-02-04 MED ORDER — POTASSIUM CHLORIDE IN NACL 20-0.9 MEQ/L-% IV SOLN
Freq: Once | INTRAVENOUS | Status: AC
  Filled 2020-02-04: qty 1000

## 2020-02-04 MED ORDER — SODIUM CHLORIDE 0.9 % IV SOLN
25.0000 mg/m2 | Freq: Once | INTRAVENOUS | Status: AC
  Administered 2020-02-04: 56 mg via INTRAVENOUS
  Filled 2020-02-04: qty 56

## 2020-02-04 NOTE — Progress Notes (Signed)
Patient with first time Gemzar and Cisplatin. Cisplatin infusion started at 1231 and at 1235 patient complained of feeling "Hot" and describing hot and clammy. Cisplatin infusion stopped and and IV Normal Saline started wide open. Pepcid 20mg  IV started at 1235 and NP to chair side. VSS and afebrile. Verbal orders received to continue NS side open for another 15 minutes and rechallenge Cisplatin infusion at previous rate.

## 2020-02-04 NOTE — Patient Instructions (Signed)
Gemcitabine injection What is this medicine? GEMCITABINE (jem SYE ta been) is a chemotherapy drug. This medicine is used to treat many types of cancer like breast cancer, lung cancer, pancreatic cancer, and ovarian cancer. This medicine may be used for other purposes; ask your health care provider or pharmacist if you have questions. COMMON BRAND NAME(S): Gemzar, Infugem What should I tell my health care provider before I take this medicine? They need to know if you have any of these conditions:  blood disorders  infection  kidney disease  liver disease  lung or breathing disease, like asthma  recent or ongoing radiation therapy  an unusual or allergic reaction to gemcitabine, other chemotherapy, other medicines, foods, dyes, or preservatives  pregnant or trying to get pregnant  breast-feeding How should I use this medicine? This drug is given as an infusion into a vein. It is administered in a hospital or clinic by a specially trained health care professional. Talk to your pediatrician regarding the use of this medicine in children. Special care may be needed. Overdosage: If you think you have taken too much of this medicine contact a poison control center or emergency room at once. NOTE: This medicine is only for you. Do not share this medicine with others. What if I miss a dose? It is important not to miss your dose. Call your doctor or health care professional if you are unable to keep an appointment. What may interact with this medicine?  medicines to increase blood counts like filgrastim, pegfilgrastim, sargramostim  some other chemotherapy drugs like cisplatin  vaccines Talk to your doctor or health care professional before taking any of these medicines:  acetaminophen  aspirin  ibuprofen  ketoprofen  naproxen This list may not describe all possible interactions. Give your health care provider a list of all the medicines, herbs, non-prescription drugs, or  dietary supplements you use. Also tell them if you smoke, drink alcohol, or use illegal drugs. Some items may interact with your medicine. What should I watch for while using this medicine? Visit your doctor for checks on your progress. This drug may make you feel generally unwell. This is not uncommon, as chemotherapy can affect healthy cells as well as cancer cells. Report any side effects. Continue your course of treatment even though you feel ill unless your doctor tells you to stop. In some cases, you may be given additional medicines to help with side effects. Follow all directions for their use. Call your doctor or health care professional for advice if you get a fever, chills or sore throat, or other symptoms of a cold or flu. Do not treat yourself. This drug decreases your body's ability to fight infections. Try to avoid being around people who are sick. This medicine may increase your risk to bruise or bleed. Call your doctor or health care professional if you notice any unusual bleeding. Be careful brushing and flossing your teeth or using a toothpick because you may get an infection or bleed more easily. If you have any dental work done, tell your dentist you are receiving this medicine. Avoid taking products that contain aspirin, acetaminophen, ibuprofen, naproxen, or ketoprofen unless instructed by your doctor. These medicines may hide a fever. Do not become pregnant while taking this medicine or for 6 months after stopping it. Women should inform their doctor if they wish to become pregnant or think they might be pregnant. Men should not father a child while taking this medicine and for 3 months after stopping it.   There is a potential for serious side effects to an unborn child. Talk to your health care professional or pharmacist for more information. Do not breast-feed an infant while taking this medicine or for at least 1 week after stopping it. Men should inform their doctors if they wish  to father a child. This medicine may lower sperm counts. Talk with your doctor or health care professional if you are concerned about your fertility. What side effects may I notice from receiving this medicine? Side effects that you should report to your doctor or health care professional as soon as possible:  allergic reactions like skin rash, itching or hives, swelling of the face, lips, or tongue  breathing problems  pain, redness, or irritation at site where injected  signs and symptoms of a dangerous change in heartbeat or heart rhythm like chest pain; dizziness; fast or irregular heartbeat; palpitations; feeling faint or lightheaded, falls; breathing problems  signs of decreased platelets or bleeding - bruising, pinpoint red spots on the skin, black, tarry stools, blood in the urine  signs of decreased red blood cells - unusually weak or tired, feeling faint or lightheaded, falls  signs of infection - fever or chills, cough, sore throat, pain or difficulty passing urine  signs and symptoms of kidney injury like trouble passing urine or change in the amount of urine  signs and symptoms of liver injury like dark yellow or brown urine; general ill feeling or flu-like symptoms; light-colored stools; loss of appetite; nausea; right upper belly pain; unusually weak or tired; yellowing of the eyes or skin  swelling of ankles, feet, hands Side effects that usually do not require medical attention (report to your doctor or health care professional if they continue or are bothersome):  constipation  diarrhea  hair loss  loss of appetite  nausea  rash  vomiting This list may not describe all possible side effects. Call your doctor for medical advice about side effects. You may report side effects to FDA at 1-800-FDA-1088. Where should I keep my medicine? This drug is given in a hospital or clinic and will not be stored at home. NOTE: This sheet is a summary. It may not cover all  possible information. If you have questions about this medicine, talk to your doctor, pharmacist, or health care provider.  2020 Elsevier/Gold Standard (2017-04-16 18:06:11) Cisplatin injection What is this medicine? CISPLATIN (SIS pla tin) is a chemotherapy drug. It targets fast dividing cells, like cancer cells, and causes these cells to die. This medicine is used to treat many types of cancer like bladder, ovarian, and testicular cancers. This medicine may be used for other purposes; ask your health care provider or pharmacist if you have questions. COMMON BRAND NAME(S): Platinol, Platinol -AQ What should I tell my health care provider before I take this medicine? They need to know if you have any of these conditions:  eye disease, vision problems  hearing problems  kidney disease  low blood counts, like white cells, platelets, or red blood cells  tingling of the fingers or toes, or other nerve disorder  an unusual or allergic reaction to cisplatin, carboplatin, oxaliplatin, other medicines, foods, dyes, or preservatives  pregnant or trying to get pregnant  breast-feeding How should I use this medicine? This drug is given as an infusion into a vein. It is administered in a hospital or clinic by a specially trained health care professional. Talk to your pediatrician regarding the use of this medicine in children. Special care may  be needed. Overdosage: If you think you have taken too much of this medicine contact a poison control center or emergency room at once. NOTE: This medicine is only for you. Do not share this medicine with others. What if I miss a dose? It is important not to miss a dose. Call your doctor or health care professional if you are unable to keep an appointment. What may interact with this medicine? This medicine may interact with the following medications:  foscarnet  certain antibiotics like amikacin, gentamicin, neomycin, polymyxin B, streptomycin,  tobramycin, vancomycin This list may not describe all possible interactions. Give your health care provider a list of all the medicines, herbs, non-prescription drugs, or dietary supplements you use. Also tell them if you smoke, drink alcohol, or use illegal drugs. Some items may interact with your medicine. What should I watch for while using this medicine? Your condition will be monitored carefully while you are receiving this medicine. You will need important blood work done while you are taking this medicine. This drug may make you feel generally unwell. This is not uncommon, as chemotherapy can affect healthy cells as well as cancer cells. Report any side effects. Continue your course of treatment even though you feel ill unless your doctor tells you to stop. This medicine may increase your risk of getting an infection. Call your healthcare professional for advice if you get a fever, chills, or sore throat, or other symptoms of a cold or flu. Do not treat yourself. Try to avoid being around people who are sick. Avoid taking medicines that contain aspirin, acetaminophen, ibuprofen, naproxen, or ketoprofen unless instructed by your healthcare professional. These medicines may hide a fever. This medicine may increase your risk to bruise or bleed. Call your doctor or health care professional if you notice any unusual bleeding. Be careful brushing and flossing your teeth or using a toothpick because you may get an infection or bleed more easily. If you have any dental work done, tell your dentist you are receiving this medicine. Do not become pregnant while taking this medicine or for 14 months after stopping it. Women should inform their healthcare professional if they wish to become pregnant or think they might be pregnant. Men should not father a child while taking this medicine and for 11 months after stopping it. There is potential for serious side effects to an unborn child. Talk to your healthcare  professional for more information. Do not breast-feed an infant while taking this medicine. This medicine has caused ovarian failure in some women. This medicine may make it more difficult to get pregnant. Talk to your healthcare professional if you are concerned about your fertility. This medicine has caused decreased sperm counts in some men. This may make it more difficult to father a child. Talk to your healthcare professional if you are concerned about your fertility. Drink fluids as directed while you are taking this medicine. This will help protect your kidneys. Call your doctor or health care professional if you get diarrhea. Do not treat yourself. What side effects may I notice from receiving this medicine? Side effects that you should report to your doctor or health care professional as soon as possible:  allergic reactions like skin rash, itching or hives, swelling of the face, lips, or tongue  blurred vision  changes in vision  decreased hearing or ringing of the ears  nausea, vomiting  pain, redness, or irritation at site where injected  pain, tingling, numbness in the hands or feet  signs and symptoms of bleeding such as bloody or black, tarry stools; red or dark brown urine; spitting up blood or brown material that looks like coffee grounds; red spots on the skin; unusual bruising or bleeding from the eyes, gums, or nose  signs and symptoms of infection like fever; chills; cough; sore throat; pain or trouble passing urine  signs and symptoms of kidney injury like trouble passing urine or change in the amount of urine  signs and symptoms of low red blood cells or anemia such as unusually weak or tired; feeling faint or lightheaded; falls; breathing problems Side effects that usually do not require medical attention (report to your doctor or health care professional if they continue or are bothersome):  loss of appetite  mouth sores  muscle cramps This list may not  describe all possible side effects. Call your doctor for medical advice about side effects. You may report side effects to FDA at 1-800-FDA-1088. Where should I keep my medicine? This drug is given in a hospital or clinic and will not be stored at home. NOTE: This sheet is a summary. It may not cover all possible information. If you have questions about this medicine, talk to your doctor, pharmacist, or health care provider.  2020 Elsevier/Gold Standard (2018-01-16 15:59:17) [facility] Discharge Instructions for Patients Receiving Chemotherapy  Today you received the following chemotherapy agents Cisplatin/Gemzar  To help prevent nausea and vomiting after your treatment, we encourage you to take your nausea medication Compazine (take evening after chemo, 30 min before dinner, and can take every 6 hours as needed-- Zofran can start on Monday 1/3 every 8 hours as needed for nausea,   You can alternate the Zofran and Compazine if needed for nausea.   If you develop nausea and vomiting that is not controlled by your nausea medication, call the clinic.   BELOW ARE SYMPTOMS THAT SHOULD BE REPORTED IMMEDIATELY:  *FEVER GREATER THAN 100.5 F  *CHILLS WITH OR WITHOUT FEVER  NAUSEA AND VOMITING THAT IS NOT CONTROLLED WITH YOUR NAUSEA MEDICATION  *UNUSUAL SHORTNESS OF BREATH  *UNUSUAL BRUISING OR BLEEDING  TENDERNESS IN MOUTH AND THROAT WITH OR WITHOUT PRESENCE OF ULCERS  *URINARY PROBLEMS  *BOWEL PROBLEMS  UNUSUAL RASH Items with * indicate a potential emergency and should be followed up as soon as possible.  Feel free to call the clinic should you have any questions or concerns. The clinic phone number is [phone].  Please show the CHEMO ALERT CARD at check-in to the Emergency Department and triage nurse.

## 2020-02-07 ENCOUNTER — Telehealth: Payer: Self-pay | Admitting: *Deleted

## 2020-02-07 ENCOUNTER — Inpatient Hospital Stay: Payer: No Typology Code available for payment source

## 2020-02-08 ENCOUNTER — Other Ambulatory Visit: Payer: Self-pay | Admitting: Family Medicine

## 2020-02-08 ENCOUNTER — Telehealth: Payer: Self-pay | Admitting: Family Medicine

## 2020-02-08 ENCOUNTER — Other Ambulatory Visit: Payer: Self-pay

## 2020-02-08 ENCOUNTER — Inpatient Hospital Stay: Payer: No Typology Code available for payment source | Attending: Oncology

## 2020-02-08 DIAGNOSIS — Z5111 Encounter for antineoplastic chemotherapy: Secondary | ICD-10-CM | POA: Insufficient documentation

## 2020-02-08 DIAGNOSIS — Z794 Long term (current) use of insulin: Secondary | ICD-10-CM | POA: Insufficient documentation

## 2020-02-08 DIAGNOSIS — C787 Secondary malignant neoplasm of liver and intrahepatic bile duct: Secondary | ICD-10-CM

## 2020-02-08 DIAGNOSIS — E785 Hyperlipidemia, unspecified: Secondary | ICD-10-CM | POA: Insufficient documentation

## 2020-02-08 DIAGNOSIS — I10 Essential (primary) hypertension: Secondary | ICD-10-CM | POA: Diagnosis not present

## 2020-02-08 DIAGNOSIS — C259 Malignant neoplasm of pancreas, unspecified: Secondary | ICD-10-CM

## 2020-02-08 DIAGNOSIS — C221 Intrahepatic bile duct carcinoma: Secondary | ICD-10-CM | POA: Diagnosis present

## 2020-02-08 DIAGNOSIS — Z5189 Encounter for other specified aftercare: Secondary | ICD-10-CM | POA: Insufficient documentation

## 2020-02-08 DIAGNOSIS — Z23 Encounter for immunization: Secondary | ICD-10-CM

## 2020-02-08 DIAGNOSIS — E1142 Type 2 diabetes mellitus with diabetic polyneuropathy: Secondary | ICD-10-CM | POA: Diagnosis not present

## 2020-02-08 DIAGNOSIS — C772 Secondary and unspecified malignant neoplasm of intra-abdominal lymph nodes: Secondary | ICD-10-CM | POA: Diagnosis present

## 2020-02-08 DIAGNOSIS — M549 Dorsalgia, unspecified: Secondary | ICD-10-CM | POA: Insufficient documentation

## 2020-02-08 DIAGNOSIS — Z79899 Other long term (current) drug therapy: Secondary | ICD-10-CM | POA: Diagnosis not present

## 2020-02-08 MED ORDER — BASAGLAR KWIKPEN 100 UNIT/ML ~~LOC~~ SOPN: 40.0000 [IU] | PEN_INJECTOR | Freq: Every day | SUBCUTANEOUS | 3 refills | Status: DC

## 2020-02-08 MED ORDER — INSULIN REGULAR HUMAN 100 UNIT/ML IJ SOLN: INTRAMUSCULAR | 11 refills | Status: DC

## 2020-02-08 NOTE — Telephone Encounter (Signed)
ok 

## 2020-02-08 NOTE — Telephone Encounter (Signed)
Ok to place order 

## 2020-02-08 NOTE — Progress Notes (Signed)
   Covid-19 Vaccination Clinic  Name:  BONNY EGGER    MRN: [id number] DOB: [birth year]  02/08/2020  Mr. Ulbricht was observed post Covid-19 immunization for 15 minutes without incident. He was provided with Vaccine Information Sheet and instruction to access the V-Safe system.   Mr. Holquin was instructed to call 911 with any severe reactions post vaccine: [name] Difficulty breathing  . Swelling of face and throat  . A fast heartbeat  . A bad rash all over body  . Dizziness and weakness   Immunizations Administered    Name Date Dose VIS Date Route   Pfizer COVID-19 Vaccine 02/08/2020  2:10 PM 0.3 mL 11/24/2019 Intramuscular   Manufacturer: [employer], [employer]   Lot: [id number]   NDC: [address]-4

## 2020-02-08 NOTE — Telephone Encounter (Signed)
Received a call from Dylan at [facility] requesting a referral to them for Bio-Duct/Liver Cancer. He states that the patient has reached out to them and would like to be seen there. [place]PT code is 99205, dx codes are: C22.9, and C22.1. Their NPI number is [id number] and tax ID is [id number]. We can fax the referral to them at [phone].  CB#[phone]

## 2020-02-09 ENCOUNTER — Telehealth: Payer: Self-pay | Admitting: Family Medicine

## 2020-02-09 ENCOUNTER — Encounter (HOSPITAL_COMMUNITY): Payer: Self-pay | Admitting: Oncology

## 2020-02-09 MED FILL — BASAGLAR 100 UNIT/ML KWIKPE: 100 | 37 days supply | Qty: 15 | Fill #0

## 2020-02-09 NOTE — Telephone Encounter (Signed)
Referral orders placed

## 2020-02-09 NOTE — Telephone Encounter (Signed)
Prior Authorization needed for novolin r 100 ml

## 2020-02-10 ENCOUNTER — Other Ambulatory Visit: Payer: Self-pay | Admitting: Family Medicine

## 2020-02-10 MED ORDER — INSULIN LISPRO (1 UNIT DIAL) 100 UNIT/ML (KWIKPEN): PEN_INJECTOR | SUBCUTANEOUS | 11 refills | Status: DC

## 2020-02-10 MED FILL — HUMALOG 100 UNITS/ML KWIKPE: 100 | 30 days supply | Qty: 12 | Fill #0

## 2020-02-10 MED FILL — UNIFINE PENTIPS 31GX3/16: 31G X 5 MM | 25 days supply | Qty: 100 | Fill #0

## 2020-02-10 NOTE — Telephone Encounter (Signed)
I have emailed the letter to [employer] and I have let wife know that we have emailed it to them.

## 2020-02-10 NOTE — Telephone Encounter (Signed)
PA required for lantus 100 unit/ml vial

## 2020-02-11 ENCOUNTER — Encounter: Payer: Self-pay | Admitting: Oncology

## 2020-02-11 ENCOUNTER — Institutional Professional Consult (permissible substitution): Payer: No Typology Code available for payment source | Admitting: Emergency Medicine

## 2020-02-11 NOTE — Telephone Encounter (Signed)
Medication was changed.   NO PA REQUIRED.

## 2020-02-13 ENCOUNTER — Other Ambulatory Visit: Payer: Self-pay | Admitting: Oncology

## 2020-02-14 ENCOUNTER — Inpatient Hospital Stay: Payer: No Typology Code available for payment source

## 2020-02-14 ENCOUNTER — Telehealth: Payer: Self-pay | Admitting: Oncology

## 2020-02-14 ENCOUNTER — Inpatient Hospital Stay (HOSPITAL_BASED_OUTPATIENT_CLINIC_OR_DEPARTMENT_OTHER): Payer: No Typology Code available for payment source | Admitting: Oncology

## 2020-02-14 ENCOUNTER — Encounter: Payer: Self-pay | Admitting: General Practice

## 2020-02-14 ENCOUNTER — Other Ambulatory Visit: Payer: Self-pay

## 2020-02-14 VITALS — BP 129/88 | HR 61 | Temp 97.3°F | Resp 16 | Ht 74.0 in | Wt 215.3 lb

## 2020-02-14 DIAGNOSIS — C221 Intrahepatic bile duct carcinoma: Secondary | ICD-10-CM

## 2020-02-14 DIAGNOSIS — Z95828 Presence of other vascular implants and grafts: Secondary | ICD-10-CM

## 2020-02-14 DIAGNOSIS — Z5111 Encounter for antineoplastic chemotherapy: Secondary | ICD-10-CM | POA: Diagnosis not present

## 2020-02-14 LAB — CMP (CANCER CENTER ONLY)
ALT: 70 U/L — ABNORMAL HIGH (ref 0–44)
AST: 46 U/L — ABNORMAL HIGH (ref 15–41)
Albumin: 3.7 g/dL (ref 3.5–5.0)
Alkaline Phosphatase: 235 U/L — ABNORMAL HIGH (ref 38–126)
Anion gap: 7 (ref 5–15)
BUN: 14 mg/dL (ref 6–20)
CO2: 27 mmol/L (ref 22–32)
Calcium: 9.6 mg/dL (ref 8.9–10.3)
Chloride: 104 mmol/L (ref 98–111)
Creatinine: 0.83 mg/dL (ref 0.61–1.24)
GFR, Estimated: >60 mL/min (ref >60)
Glucose, Bld: 181 mg/dL — ABNORMAL HIGH (ref 70–99)
Potassium: 4.3 mmol/L (ref 3.5–5.1)
Sodium: 138 mmol/L (ref 135–145)
Total Bilirubin: 0.6 mg/dL (ref 0.3–1.2)
Total Protein: 7.8 g/dL (ref 6.5–8.1)

## 2020-02-14 LAB — CBC WITH DIFFERENTIAL (CANCER CENTER ONLY)
Abs Immature Granulocytes: 0.03 10*3/uL (ref 0.00–0.07)
Basophils Absolute: 0.1 10*3/uL (ref 0.0–0.1)
Basophils Relative: 1 %
Eosinophils Absolute: 0.5 10*3/uL (ref 0.0–0.5)
Eosinophils Relative: 7 %
HCT: 42 % (ref 39.0–52.0)
Hemoglobin: 13.9 g/dL (ref 13.0–17.0)
Immature Granulocytes: 1 %
Lymphocytes Relative: 25 %
Lymphs Abs: 1.6 10*3/uL (ref 0.7–4.0)
MCH: 29.1 pg (ref 26.0–34.0)
MCHC: 33.1 g/dL (ref 30.0–36.0)
MCV: 87.9 fL (ref 80.0–100.0)
Monocytes Absolute: 0.6 10*3/uL (ref 0.1–1.0)
Monocytes Relative: 10 %
Neutro Abs: 3.5 10*3/uL (ref 1.7–7.7)
Neutrophils Relative %: 56 %
Platelet Count: 134 10*3/uL — ABNORMAL LOW (ref 150–400)
RBC: 4.78 MIL/uL (ref 4.22–5.81)
RDW: 12.7 % (ref 11.5–15.5)
WBC Count: 6.2 10*3/uL (ref 4.0–10.5)
nRBC: 0 % (ref 0.0–0.2)

## 2020-02-14 LAB — MAGNESIUM: Magnesium: 2 mg/dL (ref 1.7–2.4)

## 2020-02-14 MED ORDER — POTASSIUM CHLORIDE IN NACL 20-0.9 MEQ/L-% IV SOLN
Freq: Once | INTRAVENOUS | Status: AC
  Filled 2020-02-14: qty 1000

## 2020-02-14 MED ORDER — SODIUM CHLORIDE 0.9 % IV SOLN
150.0000 mg | Freq: Once | INTRAVENOUS | Status: AC
  Administered 2020-02-14: 150 mg via INTRAVENOUS
  Filled 2020-02-14: qty 150

## 2020-02-14 MED ORDER — SODIUM CHLORIDE 0.9% FLUSH
10.0000 mL | Freq: Once | INTRAVENOUS | Status: AC
  Administered 2020-02-14: 10 mL via INTRAVENOUS
  Filled 2020-02-14: qty 10

## 2020-02-14 MED ORDER — MAGNESIUM SULFATE 2 GM/50ML IV SOLN
INTRAVENOUS | Status: AC
  Filled 2020-02-14: qty 50

## 2020-02-14 MED ORDER — DEXAMETHASONE SODIUM PHOSPHATE 10 MG/ML IJ SOLN
INTRAMUSCULAR | Status: AC
  Filled 2020-02-14: qty 1

## 2020-02-14 MED ORDER — MAGNESIUM SULFATE 2 GM/50ML IV SOLN
2.0000 g | Freq: Once | INTRAVENOUS | Status: AC
  Administered 2020-02-14: 2 g via INTRAVENOUS

## 2020-02-14 MED ORDER — FAMOTIDINE IN NACL 20-0.9 MG/50ML-% IV SOLN
INTRAVENOUS | Status: AC
  Filled 2020-02-14: qty 50

## 2020-02-14 MED ORDER — PALONOSETRON HCL INJECTION 0.25 MG/5ML
INTRAVENOUS | Status: AC
  Filled 2020-02-14: qty 5

## 2020-02-14 MED ORDER — SODIUM CHLORIDE 0.9 % IV SOLN
25.0000 mg/m2 | Freq: Once | INTRAVENOUS | Status: AC
  Administered 2020-02-14: 56 mg via INTRAVENOUS
  Filled 2020-02-14: qty 56

## 2020-02-14 MED ORDER — DEXAMETHASONE SODIUM PHOSPHATE 10 MG/ML IJ SOLN
5.0000 mg | Freq: Once | INTRAMUSCULAR | Status: AC
  Administered 2020-02-14: 5 mg via INTRAVENOUS

## 2020-02-14 MED ORDER — SODIUM CHLORIDE 0.9 % IV SOLN
Freq: Once | INTRAVENOUS | Status: AC
  Filled 2020-02-14: qty 250

## 2020-02-14 MED ORDER — SODIUM CHLORIDE 0.9 % IV SOLN: 5.0000 mg | Freq: Once | INTRAVENOUS | Status: DC

## 2020-02-14 MED ORDER — SODIUM CHLORIDE 0.9 % IV SOLN
1000.0000 mg/m2 | Freq: Once | INTRAVENOUS | Status: AC
  Administered 2020-02-14: 2242 mg via INTRAVENOUS
  Filled 2020-02-14: qty 58.97

## 2020-02-14 MED ORDER — SODIUM CHLORIDE 0.9% FLUSH
10.0000 mL | INTRAVENOUS | Status: DC | PRN
  Administered 2020-02-14: 10 mL
  Filled 2020-02-14: qty 10

## 2020-02-14 MED ORDER — PALONOSETRON HCL INJECTION 0.25 MG/5ML
0.2500 mg | Freq: Once | INTRAVENOUS | Status: AC
  Administered 2020-02-14: 0.25 mg via INTRAVENOUS

## 2020-02-14 MED ORDER — HEPARIN SOD (PORK) LOCK FLUSH 100 UNIT/ML IV SOLN
500.0000 [IU] | Freq: Once | INTRAVENOUS | Status: AC | PRN
  Administered 2020-02-14: 500 [IU]
  Filled 2020-02-14: qty 5

## 2020-02-14 MED ORDER — FAMOTIDINE IN NACL 20-0.9 MG/50ML-% IV SOLN
20.0000 mg | Freq: Once | INTRAVENOUS | Status: AC
  Administered 2020-02-14: 20 mg via INTRAVENOUS

## 2020-02-14 NOTE — Progress Notes (Signed)
[place] Note  Met Matthew Osborne in infusion as planned, bringing a packet of [facility]) programming information and a handmade blessing blanket as a tangible sign of comfort, support, and encouragement. Matthew Osborne described his process of adjusting to his diagnosis, including that he is currently in a place of being "at peace" as he takes action to fight the disease. 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 We plan to follow up at a future treatment, and he knows to contact [facility] whenever needed/desired as well.   [place], [place], [facility] Pager [phone] Voicemail [phone]

## 2020-02-14 NOTE — Telephone Encounter (Signed)
Scheduled appointments per 1/10 los. Updated calendar printed for patient in infusion per RN Edwena Blow.

## 2020-02-14 NOTE — Progress Notes (Signed)
[place] OFFICE PROGRESS NOTE   Diagnosis: Cholangiocarcinoma  INTERVAL HISTORY:   Mr. Merfeld completed cycle 1 day 1 gemcitabine/cisplatin on 02/04/2020.  He developed a hot feeling and diaphoresis at the beginning of the cisplatin infusion.  He received Pepcid and was able to complete the infusion.  He reports hyperglycemia for 3 days following chemotherapy.  The blood sugar rose to over 400.  He used an insulin sliding scale.  No fever.  He developed a rash and dryness at the dorsum of the hands for the past few days.  This has improved with a moisturizer.  He no longer has a cough.  Good appetite.  Mild nausea relieved with antiemetics.  No emesis.  He has decided to seek a second opinion with Dr. Oralia Rud at [facility].  Objective:  Vital signs in last 24 hours:  Blood pressure 129/88, pulse 61, temperature (!) 97.3 F (36.3 C), temperature source Tympanic, resp. rate 16, height 6' 2"  (1.88 m), weight 215 lb 4.8 oz (97.7 kg), SpO2 98 %.    HEENT: No thrush or ulcers Resp: Lungs clear bilaterally Cardio: Regular rate and rhythm GI: No hepatosplenomegaly, nontender Vascular: No leg edema  Skin: Dryness with a few areas of superficial cracking at the dorsum of the hands  Portacath/PICC-without erythema  Lab Results:  Lab Results  Component Value Date   WBC 6.2 02/14/2020   HGB 13.9 02/14/2020   HCT 42.0 02/14/2020   MCV 87.9 02/14/2020   PLT 134 (L) 02/14/2020   NEUTROABS 3.5 02/14/2020    CMP  Lab Results  Component Value Date   NA 138 02/14/2020   K 4.3 02/14/2020   CL 104 02/14/2020   CO2 27 02/14/2020   GLUCOSE 181 (H) 02/14/2020   BUN 14 02/14/2020   CREATININE 0.83 02/14/2020   CALCIUM 9.6 02/14/2020   PROT 7.8 02/14/2020   ALBUMIN 3.7 02/14/2020   AST 46 (H) 02/14/2020   ALT 70 (H) 02/14/2020   ALKPHOS 235 (H) 02/14/2020   BILITOT 0.6 02/14/2020   GFRNONAA >60 02/14/2020   GFRAA 127 01/11/2020    Lab Results  Component Value Date    CEA1 2.15 01/14/2020     Medications: I have reviewed the patient's current medications.   Assessment/Plan: 1. Cholangiocarcinoma  multiple liver masses and abdominal lymphadenopathy  CTs 01/13/2020-rounded hypodense mass appears to arise from the pancreas neck, multiple rim-enhancing masses in the liver, primarily left liver with segmental dilation of the left lobe Intermatic bile ducts, ill-defined hypodensity the central liver with effacement of the left portal vein, enlarged portacaval and retroperitoneal lymph nodes  Ultrasound-guided biopsy of the left liver lesion 01/19/2020-adenocarcinoma, cytokeratin 7+, MSS, tumor mutation burden 1, IDH1 R132C  MRI abdomen 01/31/2020-poorly marginated central liver mass, multiple smaller similar satellite liver masses, extrinsic mass-effect at the biliary hilum with intrahepatic biliary ductal dilatation throughout the left liver with mild Intermatic dilatation the superior right liver, no pancreas mass or ductal dilatation, normal spleen size, numerous enlarged enhancing lymph nodes at the porta hepatis, peripancreatic, portacaval, aortocaval, left periaortic chains  Cycle 1 gemcitabine/cisplatin 02/04/2020  2. Cough-likely related to diaphragmatic irritation from #1 3. Anorexia/weight loss 4. Hypercalcemia-likely hypercalcemia malignancy, status post intravenous hydration and Zometa 01/14/2020 5. Diabetes 6. Hyperlipidemia 7. Hypertension 8. History of peripheral neuropathy secondary to diabetes     Disposition: Mr. Nanna tolerated the first treatment with gemcitabine/cisplatin well.  I suspect the hot feeling during the cisplatin infusion was related to Decadron.  He also developed  hyperglycemia for several days following chemotherapy.  Decadron will be decreased to 5 mg with this cycle.  He will complete day 8 chemotherapy today.  He will receive G-CSF tomorrow.  We reviewed potential toxicities associated with G-CSF including the chance  of bone pain, rash, and splenic rupture.  He agrees to proceed.  Mr. Khader will be scheduled for an office visit and cycle 2 gemcitabine/cisplatin on 02/28/2020.  We discussed results from [facility] 1 testing.  The tumor returned microsatellite stable with a tumor mutation burden of 1.  There is an IDH1 mutation.  He will be a candidate for IDH1 directed therapy in the future.  He plans to seek a second opinion at [facility] to discuss the indication for surgical resection and for systemic treatment recommendations.  Betsy Coder, MD  02/14/2020  9:29 AM

## 2020-02-14 NOTE — Patient Instructions (Signed)
Cancer Center Discharge Instructions for Patients Receiving Chemotherapy  Today you received the following chemotherapy agents gemcitabine, cisplatin  To help prevent nausea and vomiting after your treatment, we encourage you to take your nausea medication as directed.   If you develop nausea and vomiting that is not controlled by your nausea medication, call the clinic.   BELOW ARE SYMPTOMS THAT SHOULD BE REPORTED IMMEDIATELY:  *FEVER GREATER THAN 100.5 F  *CHILLS WITH OR WITHOUT FEVER  NAUSEA AND VOMITING THAT IS NOT CONTROLLED WITH YOUR NAUSEA MEDICATION  *UNUSUAL SHORTNESS OF BREATH  *UNUSUAL BRUISING OR BLEEDING  TENDERNESS IN MOUTH AND THROAT WITH OR WITHOUT PRESENCE OF ULCERS  *URINARY PROBLEMS  *BOWEL PROBLEMS  UNUSUAL RASH Items with * indicate a potential emergency and should be followed up as soon as possible.  Feel free to call the clinic should you have any questions or concerns. The clinic phone number is [phone].  Please show the CHEMO ALERT CARD at check-in to the Emergency Department and triage nurse.   

## 2020-02-15 ENCOUNTER — Inpatient Hospital Stay: Payer: No Typology Code available for payment source

## 2020-02-15 ENCOUNTER — Other Ambulatory Visit: Payer: Self-pay | Admitting: Family Medicine

## 2020-02-15 ENCOUNTER — Ambulatory Visit: Payer: No Typology Code available for payment source

## 2020-02-15 ENCOUNTER — Other Ambulatory Visit: Payer: Self-pay

## 2020-02-15 ENCOUNTER — Other Ambulatory Visit: Payer: Self-pay | Admitting: *Deleted

## 2020-02-15 VITALS — BP 153/88 | HR 67 | Temp 97.9°F | Resp 18

## 2020-02-15 DIAGNOSIS — C221 Intrahepatic bile duct carcinoma: Secondary | ICD-10-CM

## 2020-02-15 DIAGNOSIS — Z5111 Encounter for antineoplastic chemotherapy: Secondary | ICD-10-CM | POA: Diagnosis not present

## 2020-02-15 MED ORDER — PEGFILGRASTIM-JMDB 6 MG/0.6ML ~~LOC~~ SOSY
6.0000 mg | PREFILLED_SYRINGE | Freq: Once | SUBCUTANEOUS | Status: AC
  Administered 2020-02-15: 6 mg via SUBCUTANEOUS

## 2020-02-15 MED ORDER — BASAGLAR KWIKPEN 100 UNIT/ML ~~LOC~~ SOPN: 70.0000 [IU] | PEN_INJECTOR | Freq: Every day | SUBCUTANEOUS | 3 refills | Status: DC

## 2020-02-16 ENCOUNTER — Inpatient Hospital Stay: Payer: No Typology Code available for payment source | Admitting: Nutrition

## 2020-02-16 ENCOUNTER — Inpatient Hospital Stay: Payer: No Typology Code available for payment source | Admitting: Genetic Counselor

## 2020-02-16 ENCOUNTER — Telehealth: Payer: Self-pay | Admitting: Nutrition

## 2020-02-16 ENCOUNTER — Inpatient Hospital Stay: Payer: No Typology Code available for payment source

## 2020-02-16 NOTE — Telephone Encounter (Signed)
Patient requested appointment be changed to telephone visit.  45 year old male diagnosed with cholangiocarcinoma.  He is followed by Dr. Benay Spice.  He is receiving gemcitabine and cisplatin.  Past medical history includes diabetes, hyperlipidemia, hypertension.  Medications include insulin.  Labs were reviewed.  Eye: 6 feet 2 inches. Weight: 215.3 pounds on January 10. Usual body weight: 330 pounds per patient.  Patient weighed 252 pounds in March 2021. BMI: 27.64.  Patient noted to have increased blood sugars after cisplatin.  He uses sliding scale insulin. Reports his appetite is very good. Inquiring about foods to help with his cancer diagnosis. Reported he has lost about 40 pounds since August 2021. He has a little constipation and plans on taking over-the-counter medications. Reports he has tried to increase fish and vegetables in his diet.  He has decreased fried foods.  Nutrition diagnosis: Unintended weight loss related to new diagnosis of cholangiocarcinoma as evidenced by 15% weight loss over 10 months.  Intervention: Educated patient to consume smaller more frequent meals and snacks utilizing adequate calories and protein. Reviewed healthy plant-based diet and appropriate protein foods. Encouraged increased fluids. Brief strategies given for constipation. Will mail fact sheets at patient request.  Questions were answered.  Teach back method used.  Monitoring, evaluation, goals: Patient will tolerate adequate calories and protein to minimize weight loss throughout treatment.  Next visit: Monday, January 31 during infusion.  **Disclaimer: This note was dictated with voice recognition software. Similar sounding words can inadvertently be transcribed and this note may contain transcription errors which may not have been corrected upon publication of note.**

## 2020-02-18 ENCOUNTER — Encounter: Payer: Self-pay | Admitting: Oncology

## 2020-02-27 ENCOUNTER — Other Ambulatory Visit: Payer: Self-pay | Admitting: Oncology

## 2020-02-28 ENCOUNTER — Encounter: Payer: Self-pay | Admitting: Family Medicine

## 2020-02-28 ENCOUNTER — Other Ambulatory Visit: Payer: Self-pay

## 2020-02-28 ENCOUNTER — Encounter: Payer: Self-pay | Admitting: Nurse Practitioner

## 2020-02-28 ENCOUNTER — Other Ambulatory Visit: Payer: Self-pay | Admitting: Oncology

## 2020-02-28 ENCOUNTER — Inpatient Hospital Stay: Payer: No Typology Code available for payment source

## 2020-02-28 ENCOUNTER — Inpatient Hospital Stay (HOSPITAL_BASED_OUTPATIENT_CLINIC_OR_DEPARTMENT_OTHER): Payer: No Typology Code available for payment source | Admitting: Nurse Practitioner

## 2020-02-28 VITALS — BP 122/89 | HR 76 | Temp 97.2°F | Resp 16 | Ht 74.0 in | Wt 222.3 lb

## 2020-02-28 DIAGNOSIS — C221 Intrahepatic bile duct carcinoma: Secondary | ICD-10-CM

## 2020-02-28 DIAGNOSIS — Z95828 Presence of other vascular implants and grafts: Secondary | ICD-10-CM

## 2020-02-28 DIAGNOSIS — Z5111 Encounter for antineoplastic chemotherapy: Secondary | ICD-10-CM | POA: Diagnosis not present

## 2020-02-28 LAB — CMP (CANCER CENTER ONLY)
ALT: 32 U/L (ref 0–44)
AST: 35 U/L (ref 15–41)
Albumin: 3.8 g/dL (ref 3.5–5.0)
Alkaline Phosphatase: 233 U/L — ABNORMAL HIGH (ref 38–126)
Anion gap: 5 (ref 5–15)
BUN: 16 mg/dL (ref 6–20)
CO2: 26 mmol/L (ref 22–32)
Calcium: 9.2 mg/dL (ref 8.9–10.3)
Chloride: 104 mmol/L (ref 98–111)
Creatinine: 1.05 mg/dL (ref 0.61–1.24)
GFR, Estimated: >60 mL/min (ref >60)
Glucose, Bld: 215 mg/dL — ABNORMAL HIGH (ref 70–99)
Potassium: 4.5 mmol/L (ref 3.5–5.1)
Sodium: 135 mmol/L (ref 135–145)
Total Bilirubin: 0.5 mg/dL (ref 0.3–1.2)
Total Protein: 7.7 g/dL (ref 6.5–8.1)

## 2020-02-28 LAB — CBC WITH DIFFERENTIAL (CANCER CENTER ONLY)
Abs Immature Granulocytes: 0.06 10*3/uL (ref 0.00–0.07)
Basophils Absolute: 0.2 10*3/uL — ABNORMAL HIGH (ref 0.0–0.1)
Basophils Relative: 2 %
Eosinophils Absolute: 0.5 10*3/uL (ref 0.0–0.5)
Eosinophils Relative: 5 %
HCT: 41.2 % (ref 39.0–52.0)
Hemoglobin: 13.3 g/dL (ref 13.0–17.0)
Immature Granulocytes: 1 %
Lymphocytes Relative: 17 %
Lymphs Abs: 1.7 10*3/uL (ref 0.7–4.0)
MCH: 29.1 pg (ref 26.0–34.0)
MCHC: 32.3 g/dL (ref 30.0–36.0)
MCV: 90.2 fL (ref 80.0–100.0)
Monocytes Absolute: 0.9 10*3/uL (ref 0.1–1.0)
Monocytes Relative: 9 %
Neutro Abs: 6.6 10*3/uL (ref 1.7–7.7)
Neutrophils Relative %: 66 %
Platelet Count: 238 10*3/uL (ref 150–400)
RBC: 4.57 MIL/uL (ref 4.22–5.81)
RDW: 14.4 % (ref 11.5–15.5)
WBC Count: 9.8 10*3/uL (ref 4.0–10.5)
nRBC: 0 % (ref 0.0–0.2)

## 2020-02-28 LAB — MAGNESIUM: Magnesium: 2.1 mg/dL (ref 1.7–2.4)

## 2020-02-28 MED ORDER — OXYCODONE HCL 5 MG PO TABS: 5.0000 mg | ORAL_TABLET | Freq: Four times a day (QID) | ORAL | 0 refills | Status: DC | PRN

## 2020-02-28 MED ORDER — MAGNESIUM SULFATE 2 GM/50ML IV SOLN
INTRAVENOUS | Status: AC
  Filled 2020-02-28: qty 50

## 2020-02-28 MED ORDER — PALONOSETRON HCL INJECTION 0.25 MG/5ML
0.2500 mg | Freq: Once | INTRAVENOUS | Status: AC
  Administered 2020-02-28: 0.25 mg via INTRAVENOUS

## 2020-02-28 MED ORDER — PALONOSETRON HCL INJECTION 0.25 MG/5ML
INTRAVENOUS | Status: AC
  Filled 2020-02-28: qty 5

## 2020-02-28 MED ORDER — FAMOTIDINE IN NACL 20-0.9 MG/50ML-% IV SOLN
INTRAVENOUS | Status: AC
  Filled 2020-02-28: qty 50

## 2020-02-28 MED ORDER — SODIUM CHLORIDE 0.9 % IV SOLN
150.0000 mg | Freq: Once | INTRAVENOUS | Status: AC
  Administered 2020-02-28: 150 mg via INTRAVENOUS
  Filled 2020-02-28: qty 150

## 2020-02-28 MED ORDER — SODIUM CHLORIDE 0.9% FLUSH
10.0000 mL | INTRAVENOUS | Status: DC | PRN
  Administered 2020-02-28: 10 mL
  Filled 2020-02-28: qty 10

## 2020-02-28 MED ORDER — SODIUM CHLORIDE 0.9 % IV SOLN
Freq: Once | INTRAVENOUS | Status: AC
  Filled 2020-02-28: qty 250

## 2020-02-28 MED ORDER — MAGNESIUM SULFATE 2 GM/50ML IV SOLN
2.0000 g | Freq: Once | INTRAVENOUS | Status: AC
  Administered 2020-02-28: 2 g via INTRAVENOUS

## 2020-02-28 MED ORDER — SODIUM CHLORIDE 0.9 % IV SOLN: 5.0000 mg | Freq: Once | INTRAVENOUS | Status: DC

## 2020-02-28 MED ORDER — SODIUM CHLORIDE 0.9% FLUSH
10.0000 mL | INTRAVENOUS | Status: DC | PRN
  Administered 2020-02-28: 10 mL via INTRAVENOUS
  Filled 2020-02-28: qty 10

## 2020-02-28 MED ORDER — FAMOTIDINE IN NACL 20-0.9 MG/50ML-% IV SOLN
20.0000 mg | Freq: Once | INTRAVENOUS | Status: AC
  Administered 2020-02-28: 20 mg via INTRAVENOUS

## 2020-02-28 MED ORDER — HEPARIN SOD (PORK) LOCK FLUSH 100 UNIT/ML IV SOLN
500.0000 [IU] | Freq: Once | INTRAVENOUS | Status: AC | PRN
  Administered 2020-02-28: 500 [IU]
  Filled 2020-02-28: qty 5

## 2020-02-28 MED ORDER — SODIUM CHLORIDE 0.9 % IV SOLN
25.0000 mg/m2 | Freq: Once | INTRAVENOUS | Status: AC
  Administered 2020-02-28: 56 mg via INTRAVENOUS
  Filled 2020-02-28: qty 56

## 2020-02-28 MED ORDER — POTASSIUM CHLORIDE IN NACL 20-0.9 MEQ/L-% IV SOLN
Freq: Once | INTRAVENOUS | Status: AC
Start: 2020-02-28 — End: 2020-02-28
  Filled 2020-02-28: qty 1000

## 2020-02-28 MED ORDER — SODIUM CHLORIDE 0.9 % IV SOLN
Freq: Once | INTRAVENOUS | Status: AC
Start: 2020-02-28 — End: 2020-02-28
  Filled 2020-02-28: qty 250

## 2020-02-28 MED ORDER — SODIUM CHLORIDE 0.9 % IV SOLN
1000.0000 mg/m2 | Freq: Once | INTRAVENOUS | Status: AC
  Administered 2020-02-28: 2242 mg via INTRAVENOUS
  Filled 2020-02-28: qty 58.97

## 2020-02-28 MED ORDER — DEXAMETHASONE SODIUM PHOSPHATE 10 MG/ML IJ SOLN
INTRAMUSCULAR | Status: AC
  Filled 2020-02-28: qty 1

## 2020-02-28 MED ORDER — DEXAMETHASONE SODIUM PHOSPHATE 10 MG/ML IJ SOLN
5.0000 mg | Freq: Once | INTRAMUSCULAR | Status: AC
  Administered 2020-02-28: 5 mg via INTRAVENOUS

## 2020-02-28 NOTE — Patient Instructions (Signed)
[facility] Discharge Instructions for Patients Receiving Chemotherapy  Today you received the following chemotherapy agents Gemcitabine (GEMZAR) & Cisplatin (PLATINOL).  To help prevent nausea and vomiting after your treatment, we encourage you to take your nausea medication as prescribed.  If you develop nausea and vomiting that is not controlled by your nausea medication, call the clinic.   BELOW ARE SYMPTOMS THAT SHOULD BE REPORTED IMMEDIATELY:  *FEVER GREATER THAN 100.5 F  *CHILLS WITH OR WITHOUT FEVER  NAUSEA AND VOMITING THAT IS NOT CONTROLLED WITH YOUR NAUSEA MEDICATION  *UNUSUAL SHORTNESS OF BREATH  *UNUSUAL BRUISING OR BLEEDING  TENDERNESS IN MOUTH AND THROAT WITH OR WITHOUT PRESENCE OF ULCERS  *URINARY PROBLEMS  *BOWEL PROBLEMS  UNUSUAL RASH Items with * indicate a potential emergency and should be followed up as soon as possible.  Feel free to call the clinic should you have any questions or concerns. The clinic phone number is [phone].  Please show the CHEMO ALERT CARD at check-in to the Emergency Department and triage nurse.   

## 2020-02-28 NOTE — Progress Notes (Addendum)
Matthew Osborne OFFICE PROGRESS NOTE   Diagnosis: Cholangiocarcinoma  INTERVAL HISTORY:   [name] returns as scheduled.  He completed cycle 1 day 8 gemcitabine/cisplatin 02/14/2020.  He denies nausea/vomiting.  No mouth sores.  No diarrhea.  No tinnitus or hearing loss.  He thinks he has increased mild intermittent numbness in the feet.  No fever.  No significant cough.  He has noticed a few red bumps on his chest.  He reports severe back pain for 1 day following Fulphila.  He took tramadol with no improvement.  Objective:  Vital signs in last 24 hours:  Blood pressure 122/89, pulse 76, temperature (!) 97.2 F (36.2 C), temperature source Temporal, resp. rate 16, height 6' 2"  (1.88 m), weight 222 lb 4.8 oz (100.8 kg), SpO2 100 %.    HEENT: No thrush or ulcers. Resp: Lungs clear bilaterally. Cardio: Regular rate and rhythm. GI: Abdomen soft and nontender.  No hepatosplenomegaly. Vascular: No leg edema. Skin: Few small acne type lesions scattered over the anterior chest wall. Port-A-Cath without erythema.  Lab Results:  Lab Results  Component Value Date   WBC 9.8 02/28/2020   HGB 13.3 02/28/2020   HCT 41.2 02/28/2020   MCV 90.2 02/28/2020   PLT 238 02/28/2020   NEUTROABS 6.6 02/28/2020    Imaging:  No results found.  Medications: I have reviewed the patient's current medications.  Assessment/Plan: 1. Cholangiocarcinoma  multiple liver masses and abdominal lymphadenopathy ? CTs 01/13/2020-rounded hypodense mass appears to arise from the pancreas neck, multiple rim-enhancing masses in the liver, primarily left liver with segmental dilation of the left lobe Intermatic bile ducts, ill-defined hypodensity the central liver with effacement of the left portal vein, enlarged portacaval and retroperitoneal lymph nodes ? Ultrasound-guided biopsy of the left liver lesion 01/19/2020-adenocarcinoma, cytokeratin 7+, MSS, tumor mutation burden 1, IDH1 R132C ? MRI abdomen  01/31/2020-poorly marginated central liver mass, multiple smaller similar satellite liver masses, extrinsic mass-effect at the biliary hilum with intrahepatic biliary ductal dilatation throughout the left liver with mild Intermatic dilatation the superior right liver, no pancreas mass or ductal dilatation, normal spleen size, numerous enlarged enhancing lymph nodes at the porta hepatis, peripancreatic, portacaval, aortocaval, left periaortic chains ? Cycle 1 gemcitabine/cisplatin 02/04/2020 ? Cycle 2 gemcitabine/cisplatin 02/28/2020  2. Cough-likely related to diaphragmatic irritation from #1 3. Anorexia/weight loss 4. Hypercalcemia-likely hypercalcemia malignancy, status post intravenous hydration and Zometa 01/14/2020 5. Diabetes 6. Hyperlipidemia 7. Hypertension 8. History of peripheral neuropathy secondary to diabetes 9. Severe back pain following Fulphila-oxycodone prescribed   Disposition: [name] appears stable.  He has completed 1 cycle of gemcitabine/cisplatin.  Plan to proceed with cycle 2 today as scheduled.  He developed significant back pain following white cell growth factor support.  Tramadol was not effective.  Prescription sent to his pharmacy for oxycodone.  We reviewed the CBC from today.  Counts adequate to proceed with treatment.  He will for cycle 2-day 8 in 1 week.  We will see him in follow-up in 3 weeks.  He will contact the office in the interim with any problems.  Patient seen with Dr. Benay Spice.    Ned Card ANP/GNP-BC   02/28/2020  9:24 AM This was a shared visit with Ned Card.  Mr. Cominsky will complete day 1 cycle 2 chemotherapy today.  We prescribed oxycodone for bone pain associated with G-CSF.  He is working on scheduling an appointment at [facility].  The plan is to schedule a restaging CT after cycle 3.  Julieanne Manson,  MD

## 2020-02-29 ENCOUNTER — Telehealth: Payer: Self-pay | Admitting: Nurse Practitioner

## 2020-02-29 ENCOUNTER — Inpatient Hospital Stay: Payer: No Typology Code available for payment source

## 2020-02-29 ENCOUNTER — Inpatient Hospital Stay (HOSPITAL_BASED_OUTPATIENT_CLINIC_OR_DEPARTMENT_OTHER): Payer: No Typology Code available for payment source | Admitting: Genetic Counselor

## 2020-02-29 ENCOUNTER — Other Ambulatory Visit: Payer: Self-pay | Admitting: Genetic Counselor

## 2020-02-29 ENCOUNTER — Encounter: Payer: Self-pay | Admitting: Genetic Counselor

## 2020-02-29 DIAGNOSIS — Z807 Family history of other malignant neoplasms of lymphoid, hematopoietic and related tissues: Secondary | ICD-10-CM | POA: Diagnosis not present

## 2020-02-29 DIAGNOSIS — C221 Intrahepatic bile duct carcinoma: Secondary | ICD-10-CM

## 2020-02-29 DIAGNOSIS — Z803 Family history of malignant neoplasm of breast: Secondary | ICD-10-CM | POA: Insufficient documentation

## 2020-02-29 NOTE — Progress Notes (Signed)
REFERRING PROVIDER: Ladell Pier, MD [place],  [place] [address]  PRIMARY PROVIDER:  Susy Frizzle, MD  PRIMARY REASON FOR VISIT:  1. Cholangiocarcinoma ([place])   2. Family history of breast cancer   3. Family history of Hodgkin's lymphoma      I connected with Mr. Beachem on 02/29/2020 at 9:00 am EDT by video conference and verified that I am speaking with the correct person using two identifiers.   Patient location: Home Provider location: [place] Office  HISTORY OF PRESENT ILLNESS:   Mr. Verbrugge, a 45 y.o. male, was seen for a [facility] cancer genetics consultation at the request of Dr. Benay Spice due to a personal and family history of cancer.  Mr. Dunaj presents to clinic today to discuss the possibility of a hereditary predisposition to cancer, genetic testing, and to further clarify his future cancer risks, as well as potential cancer risks for family members.   In December of 2021, at the age of 52, Mr. Franca was diagnosed with cholangiocarcinoma. The treatment plan includes chemotherapy.    CANCER HISTORY:  Oncology History  Hypercalcemia  Pancreas cancer ([place])  01/21/2020 Initial Diagnosis   Pancreas cancer ([place])   01/21/2020 Cancer Staging   Staging form: Exocrine Pancreas, AJCC 8th Edition - Clinical: Stage IV (cM1) - Signed by Ladell Pier, MD on 01/21/2020   Cholangiocarcinoma ([place])  02/01/2020 Initial Diagnosis   Cholangiocarcinoma ([place])   02/04/2020 -  Chemotherapy    Patient is on Treatment Plan: BILIARY TRACT CISPLATIN + GEMCITABINE D1,8 Q21D        Past Medical History:  Diagnosis Date  . De Quervain's tenosynovitis, left 03/2011  . Diabetes mellitus    NIDDM  . Family history of breast cancer   . Family history of Hodgkin's lymphoma   . Hyperlipidemia   . Hypertension    under control; has been on med. x 4 yrs.  . Nonproliferative retinopathy due to secondary diabetes [facility])     Past Surgical  History:  Procedure Laterality Date  . CARPAL TUNNEL RELEASE     bilat.  . DORSAL COMPARTMENT RELEASE  03/14/2011   Procedure: RELEASE DORSAL COMPARTMENT (DEQUERVAIN);  Surgeon: Paulene Floor, MD;  Location: [facility];  Service: Orthopedics;  Laterality: Left;  left wrist APL and EPL tenosynovectomy and 1st dorsal compartment release  . IR IMAGING GUIDED PORT INSERTION  02/01/2020  . TONSILLECTOMY AND ADENOIDECTOMY  as a child  . VASECTOMY      Social History   Socioeconomic History  . Marital status: Married    Spouse name: Not on file  . Number of children: Not on file  . Years of education: Not on file  . Highest education level: Not on file  Occupational History  . Not on file  Tobacco Use  . Smoking status: Former Smoker    Packs/day: 3.00    Years: 6.00    Pack years: 18.00    Types: Cigarettes    Quit date: 02/05/1999    Years since quitting: 21.0  . Smokeless tobacco: Never Used  Vaping Use  . Vaping Use: Never used  Substance and Sexual Activity  . Alcohol use: No    Alcohol/week: 0.0 standard drinks  . Drug use: No  . Sexual activity: Not on file  Other Topics Concern  . Not on file  Social History Narrative  . Not on file   Social Determinants of Health   Financial  Resource Strain: Low Risk   . Difficulty of Paying Living Expenses: Not hard at all  Food Insecurity: No Food Insecurity  . Worried About Charity fundraiser in the Last Year: Never true  . Ran Out of Food in the Last Year: Never true  Transportation Needs: No Transportation Needs  . Lack of Transportation (Medical): No  . Lack of Transportation (Non-Medical): No  Physical Activity: Not on file  Stress: Stress Concern Present  . Feeling of Stress : To some extent  Social Connections: Socially Integrated  . Frequency of Communication with Friends and Family: More than three times a week  . Frequency of Social Gatherings with Friends and Family: More than three times a  week  . Attends Religious Services: More than 4 times per year  . Active Member of Clubs or Organizations: Yes  . Attends Archivist Meetings: More than 4 times per year  . Marital Status: Married     FAMILY HISTORY:  We obtained a detailed, 4-generation family history.  Significant diagnoses are listed below: Family History  Problem Relation Age of Onset  . Cancer Other   . Hyperlipidemia Other   . Hypertension Brother   . Asthma Son   . Lung cancer Maternal Grandfather   . Mesothelioma Maternal Grandfather        asbestos exposure  . Rheum arthritis Maternal Grandmother   . Breast cancer Maternal Grandmother 87  . Hodgkin's lymphoma Mother 16       30 lb tumor removed from abdomen  . Cirrhosis Paternal Grandmother   . Other Maternal Great-grandfather        black lung, worked in [employer] (MGF's father)   Mr. Kriz has two sons and one daughter (ages 40-18). He has one brother and one sister (ages 53 and 46, respectively). None of these family members have had cancer.  Mr. Catala mother died at age 46 from Hodgkin's lymphoma (diagnosed age 44). He has one maternal uncle. His maternal grandmother died at age 8 and was diagnosed with breast cancer around age 31. His maternal grandfather was diagnosed with mesothelioma and had a history of mesothelioma exposure.  Mr. Kyler father is 11 and has not had cancer. Mr. Cretella has one paternal aunt and two paternal uncles. His paternal grandmother died at age 31 from cirrhosis of the liver. He does not have information about his paternal grandfather.   Mr. Ehresman is unaware of previous family history of genetic testing for hereditary cancer risks. Patient's maternal ancestors are of unknown and [place] Native American descent, and paternal ancestors are of [place] and [place] descent. There is no reported Ashkenazi Jewish ancestry. There is no known consanguinity.  GENETIC COUNSELING ASSESSMENT: Mr. Nham is a 45 y.o. male  with a personal history of cholangiocarcinoma and a family history of breast cancer, hodgkin's lymphoma, and mesothelioma, which is somewhat suggestive of a hereditary cancer syndrome and predisposition to cancer. We, therefore, discussed and recommended the following at today's visit.   DISCUSSION: We discussed that approximately 5-10% of cancer is hereditary, meaning that it is due to a mutation in a single gene that is passed down from generation to generation in a family. A small percentage of cholangiocarcinomas may be associated with Lynch syndrome, a hereditary cancer syndrome that is associated with multiple cancers (mainly colorectal and endometrial cancers) that are typically diagnosed at young ages (younger than 32). We discussed that Mr. Turnley's personal and family history is not highly suspicious for [employer]  syndrome.   We also reviewed hereditary causes of breast cancer, given his family history of young-onset breast cancer. Most cases of hereditary breast cancer are associated with the BRCA1 and BRCA2 genes, although there are other genes that can be associated with hereditary breast cancer syndromes. These include ATM, CHEK2, PALB2, etc. We discussed that testing is beneficial for several reasons, including knowing about other cancer risks, identifying potential screening and risk-reduction options that may be appropriate, and to understand if other family members could be at risk for cancer and allow them to undergo genetic testing.  We reviewed the characteristics, features and inheritance patterns of hereditary cancer syndromes. We also discussed genetic testing, including the appropriate family members to test, the process of testing, insurance coverage and turn-around-time for results. We discussed the implications of a negative, positive and/or variant of uncertain significant result. We recommended Mr. Bettes pursue genetic testing for the [facility] CancerNext-Expanded + RNAinsight gene  panel.   The CancerNext-Expanded + RNAinsight gene panel offered by [facility] and includes sequencing and rearrangement analysis for the following 77 genes: AIP, ALK, APC, ATM, AXIN2, BAP1, BARD1, BLM, BMPR1A, BRCA1, BRCA2, BRIP1, CDC73, CDH1, CDK4, CDKN1B, CDKN2A, CHEK2, CTNNA1, DICER1, FANCC, FH, FLCN, GALNT12, KIF1B, LZTR1, MAX, MEN1, MET, MLH1, MSH2, MSH3, MSH6, MUTYH, NBN, NF1, NF2, NTHL1, PALB2, PHOX2B, PMS2, POT1, PRKAR1A, PTCH1, PTEN, RAD51C, RAD51D, RB1, RECQL, RET, SDHA, SDHAF2, SDHB, SDHC, SDHD, SMAD4, SMARCA4, SMARCB1, SMARCE1, STK11, SUFU, TMEM127, TP53, TSC1, TSC2, VHL and XRCC2 (sequencing and deletion/duplication); EGFR, EGLN1, HOXB13, KIT, MITF, PDGFRA, POLD1 and POLE (sequencing only); EPCAM and GREM1 (deletion/duplication only). RNA data is routinely analyzed for use in variant interpretation for all genes.  Based on Mr. Mangine's family history of young-onset breast cancer, he meets medical criteria for genetic testing. Despite that he meets criteria, there may still be an out of pocket cost. We discussed that if his out of pocket cost for testing is over $100, the laboratory will reach out to let him know. If the out of pocket cost of testing is less than $100 he will be billed by the genetic testing laboratory.   PLAN: After considering the risks, benefits, and limitations, Mr. Gura provided informed consent to pursue genetic testing and the blood sample was sent to [facility] for analysis of the CancerNext-Expanded + RNAinsight panel. Results should be available within approximately two-three weeks' time, at which point they will be disclosed by telephone to Mr. Linch, as will any additional recommendations warranted by these results. Mr. Goudeau will receive a summary of his genetic counseling visit and a copy of his results once available. This information will also be available in Epic.   Mr. Perrott questions were answered to his satisfaction today. Our  contact information was provided should additional questions or concerns arise. Thank you for the referral and allowing [place] to share in the care of your patient.   Clint Guy, [place], [facility] Licensed, Certified Dispensing [email or web address] Phone: [phone]  The patient was seen for a total of 40 minutes in face-to-face genetic counseling.  This patient was discussed with Drs. Magrinat, Lindi Adie and/or Burr Medico who agrees with the above.    _______________________________________________________________________ For Office Staff:  Number of people involved in session: 2 Was an Intern/ student involved with case: yes

## 2020-02-29 NOTE — Telephone Encounter (Signed)
Scheduled appointments per 1/24 los. Spoke to patient who is aware of appointments dates and times.  

## 2020-03-01 ENCOUNTER — Encounter: Payer: Self-pay | Admitting: General Practice

## 2020-03-01 NOTE — Progress Notes (Signed)
[place] Spiritual Care Note  Was unable to see Matthew Osborne at last treatment, so followed up by phone for pastoral check-in. He reports good spirits and high gratitude in the midst of the low-energy slump post infusion. Provided empathic listening and affirmation of his spiritual gifts of faith and gratitude. We plan to follow up at his next treatment.   [place], [place], [facility] Pager [phone] Voicemail [phone]

## 2020-03-03 ENCOUNTER — Encounter: Payer: Self-pay | Admitting: Family Medicine

## 2020-03-03 ENCOUNTER — Other Ambulatory Visit (HOSPITAL_COMMUNITY): Payer: Self-pay | Admitting: Family Medicine

## 2020-03-03 ENCOUNTER — Ambulatory Visit (INDEPENDENT_AMBULATORY_CARE_PROVIDER_SITE_OTHER): Payer: No Typology Code available for payment source | Admitting: Family Medicine

## 2020-03-03 ENCOUNTER — Other Ambulatory Visit: Payer: Self-pay

## 2020-03-03 VITALS — BP 132/68 | HR 73 | Temp 98.1°F | Resp 17 | Ht 74.0 in | Wt 222.0 lb

## 2020-03-03 DIAGNOSIS — E1165 Type 2 diabetes mellitus with hyperglycemia: Secondary | ICD-10-CM | POA: Diagnosis not present

## 2020-03-03 DIAGNOSIS — Z794 Long term (current) use of insulin: Secondary | ICD-10-CM | POA: Diagnosis not present

## 2020-03-03 DIAGNOSIS — IMO0002 Reserved for concepts with insufficient information to code with codable children: Secondary | ICD-10-CM

## 2020-03-03 DIAGNOSIS — E118 Type 2 diabetes mellitus with unspecified complications: Secondary | ICD-10-CM

## 2020-03-03 DIAGNOSIS — C221 Intrahepatic bile duct carcinoma: Secondary | ICD-10-CM

## 2020-03-03 NOTE — Progress Notes (Signed)
Subjective:    Patient ID: Matthew Osborne, male    DOB: 04-28-1975, 45 y.o.   MRN: [id number]  HPI  Since I last saw the patient, he has been diagnosed with cholangiocarcinoma unfortunately.  He is on chemotherapy now.  He is on Decadron.  His current chemotherapy cycle is to receive chemo on Monday.  He takes this 2 weeks in a row and then takes a week off.  This cycle is repeated 3 times.  He is halfway through this current regimen.  3 days after receiving chemo and especially the Decadron, his blood sugars have been 300-400 and occasionally unreadable.  This is despite using 100 units of Lantus.  His wife is also covering him with sliding scale.  He is averaging anywhere from 120 240 total units of insulin a day.  She is checking his blood sugar 5 and 6 times a day and his blood sugar is consistently 300-4 100+ on the 3 days after receiving Decadron.  On the 4 days following that, his blood sugar is typically 200-300 receiving 80 units of Basaglar and 20 units of rapid acting insulin Past Medical History:  Diagnosis Date  . Cancer ([place])   . De Quervain's tenosynovitis, left 03/2011  . Diabetes mellitus    NIDDM  . Family history of breast cancer   . Family history of Hodgkin's lymphoma   . Hyperlipidemia   . Hypertension    under control; has been on med. x 4 yrs.  . Nonproliferative retinopathy due to secondary diabetes [facility])    Past Surgical History:  Procedure Laterality Date  . CARPAL TUNNEL RELEASE     bilat.  . DORSAL COMPARTMENT RELEASE  03/14/2011   Procedure: RELEASE DORSAL COMPARTMENT (DEQUERVAIN);  Surgeon: Paulene Floor, MD;  Location: [facility];  Service: Orthopedics;  Laterality: Left;  left wrist APL and EPL tenosynovectomy and 1st dorsal compartment release  . IR IMAGING GUIDED PORT INSERTION  02/01/2020  . TONSILLECTOMY AND ADENOIDECTOMY  as a child  . VASECTOMY     Current Outpatient Medications on File Prior to Visit  Medication Sig Dispense  Refill  . bisoprolol-hydrochlorothiazide (ZIAC) 10-6.25 MG tablet TAKE 1 TABLET BY MOUTH EVERY DAY IN THE MORNING (Patient taking differently: Take 1 tablet by mouth daily.) 90 tablet 3  . blood glucose meter kit and supplies KIT Fasting prior to each meal three times a day 1 each 0  . glucose blood test strip Dispense based on patient and insurance preference.  Use twice daily as directed. (FOR ICD-10 E11.65) 100 each 11  . Insulin Glargine (BASAGLAR KWIKPEN) 100 UNIT/ML Inject 70 Units into the skin daily. (Patient taking differently: Inject 70 Units into the skin 2 (two) times daily.) 15 mL 3  . insulin lispro (HUMALOG KWIKPEN) 100 UNIT/ML KwikPen Check fasting blood sugar just prior to eating breakfast, lunch, and dinner Based on your blood sugar take insulin as directed: BG 150-200 take 2 units BG 201-250 take 4 units BG 251-300 take 6 units BG 301-350 take 8 units BG 351-400 take 10 units BG >401 take 12 units and call doctor, 15 mL 11  . Insulin Syringe 27G X 1/2" 0.5 ML MISC Use as directed to inject insulin SQ Q1D. 100 each 3  . Lancets Thin MISC Check BS BID 100 each 5  . oxyCODONE (OXY IR/ROXICODONE) 5 MG immediate release tablet Take 1 tablet (5 mg total) by mouth every 6 (six) hours as needed for severe pain.  10 tablet 0  . prochlorperazine (COMPAZINE) 10 MG tablet Take 1 tablet (10 mg total) by mouth every 6 (six) hours as needed for nausea. 60 tablet 1  . TRUEPLUS LANCETS 33G MISC 1 each by Does not apply route 2 (two) times daily. 100 each 11  . chlorpheniramine-HYDROcodone (TUSSIONEX PENNKINETIC ER) 10-8 MG/5ML SUER Take 5 mLs by mouth every 12 (twelve) hours as needed for cough. (Patient not taking: No sig reported) 140 mL 0  . lidocaine-prilocaine (EMLA) cream Apply 1 application topically as directed. Apply to port site 1-2 hours prior to stick and cover with plastic wrap. (Patient not taking: No sig reported) 30 g 2  . ondansetron (ZOFRAN) 8 MG tablet Take 1 tablet (8 mg total) by  mouth every 8 (eight) hours as needed for nausea or vomiting. Begin 72 hours after IV chemotherapy treatment (Patient not taking: No sig reported) 30 tablet 1  . traMADol (ULTRAM) 50 MG tablet Take 1 tablet (50 mg total) by mouth every 6 (six) hours as needed for moderate pain (cough). (Patient not taking: No sig reported) 30 tablet 0   No current facility-administered medications on file prior to visit.   Allergies  Allergen Reactions  . Niaspan [Niacin]     Flushing    Social History   Socioeconomic History  . Marital status: Married    Spouse name: Not on file  . Number of children: Not on file  . Years of education: Not on file  . Highest education level: Not on file  Occupational History  . Not on file  Tobacco Use  . Smoking status: Former Smoker    Packs/day: 3.00    Years: 6.00    Pack years: 18.00    Types: Cigarettes    Quit date: 02/05/1999    Years since quitting: 21.0  . Smokeless tobacco: Never Used  Vaping Use  . Vaping Use: Never used  Substance and Sexual Activity  . Alcohol use: No    Alcohol/week: 0.0 standard drinks  . Drug use: No  . Sexual activity: Not on file  Other Topics Concern  . Not on file  Social History Narrative  . Not on file   Social Determinants of Health   Financial Resource Strain: Low Risk   . Difficulty of Paying Living Expenses: Not hard at all  Food Insecurity: No Food Insecurity  . Worried About Charity fundraiser in the Last Year: Never true  . Ran Out of Food in the Last Year: Never true  Transportation Needs: No Transportation Needs  . Lack of Transportation (Medical): No  . Lack of Transportation (Non-Medical): No  Physical Activity: Not on file  Stress: Stress Concern Present  . Feeling of Stress : To some extent  Social Connections: Socially Integrated  . Frequency of Communication with Friends and Family: More than three times a week  . Frequency of Social Gatherings with Friends and Family: More than three  times a week  . Attends Religious Services: More than 4 times per year  . Active Member of Clubs or Organizations: Yes  . Attends Archivist Meetings: More than 4 times per year  . Marital Status: Married  Human resources officer Violence: Not on file     Review of Systems  All other systems reviewed and are negative.      Objective:   Physical Exam Vitals reviewed.  Constitutional:      Appearance: Normal appearance.  Cardiovascular:     Rate and Rhythm:  Normal rate and regular rhythm.     Heart sounds: Normal heart sounds.  Pulmonary:     Effort: Pulmonary effort is normal.     Breath sounds: Normal breath sounds.  Neurological:     Mental Status: He is alert.           Assessment & Plan:  Type II diabetes mellitus with complication, uncontrolled (HCC)  Insulin dependent type 2 diabetes mellitus, uncontrolled (HCC)  Cholangiocarcinoma (HCC)  Therefore in summation, while on chemotherapy, the patient is using approximately 140 total units of insulin a day for 3 days after receiving Decadron and his blood sugars are still 300-400 consistently.  The 4 days after that he is on 100 total units of insulin and his blood sugars are 200-300.  Therefore we decided today that he needs additional basal insulin.  He will take 70 units of Basaglar twice a day for 3 days following Decadron and still cover with sliding scale insulin.  Hopefully this will reduce the severity of his hyperglycemia.  The 4 days after that when his blood sugars typically drop he will reduce his Basaglar dose to 50 units twice a day and then still cover with sliding scale.  We will make additional adjustments based on his response.  Once he is off chemotherapy in March we will likely need to drastically reduce his insulin requirement

## 2020-03-06 ENCOUNTER — Other Ambulatory Visit: Payer: Self-pay

## 2020-03-06 ENCOUNTER — Inpatient Hospital Stay: Payer: No Typology Code available for payment source | Admitting: Nutrition

## 2020-03-06 ENCOUNTER — Inpatient Hospital Stay (HOSPITAL_BASED_OUTPATIENT_CLINIC_OR_DEPARTMENT_OTHER): Payer: No Typology Code available for payment source | Admitting: Oncology

## 2020-03-06 ENCOUNTER — Inpatient Hospital Stay: Payer: No Typology Code available for payment source

## 2020-03-06 VITALS — BP 130/90 | HR 72 | Temp 97.2°F | Resp 18 | Ht 74.0 in | Wt 224.3 lb

## 2020-03-06 DIAGNOSIS — C221 Intrahepatic bile duct carcinoma: Secondary | ICD-10-CM

## 2020-03-06 DIAGNOSIS — Z5111 Encounter for antineoplastic chemotherapy: Secondary | ICD-10-CM | POA: Diagnosis not present

## 2020-03-06 LAB — CMP (CANCER CENTER ONLY)
ALT: 37 U/L (ref 0–44)
AST: 32 U/L (ref 15–41)
Albumin: 3.8 g/dL (ref 3.5–5.0)
Alkaline Phosphatase: 187 U/L — ABNORMAL HIGH (ref 38–126)
Anion gap: 6 (ref 5–15)
BUN: 12 mg/dL (ref 6–20)
CO2: 29 mmol/L (ref 22–32)
Calcium: 9.6 mg/dL (ref 8.9–10.3)
Chloride: 102 mmol/L (ref 98–111)
Creatinine: 0.78 mg/dL (ref 0.61–1.24)
GFR, Estimated: >60 mL/min (ref >60)
Glucose, Bld: 114 mg/dL — ABNORMAL HIGH (ref 70–99)
Potassium: 4.2 mmol/L (ref 3.5–5.1)
Sodium: 137 mmol/L (ref 135–145)
Total Bilirubin: 0.7 mg/dL (ref 0.3–1.2)
Total Protein: 7.5 g/dL (ref 6.5–8.1)

## 2020-03-06 LAB — CBC WITH DIFFERENTIAL (CANCER CENTER ONLY)
Abs Immature Granulocytes: 0.02 10*3/uL (ref 0.00–0.07)
Basophils Absolute: 0.2 10*3/uL — ABNORMAL HIGH (ref 0.0–0.1)
Basophils Relative: 3 %
Eosinophils Absolute: 0.3 10*3/uL (ref 0.0–0.5)
Eosinophils Relative: 5 %
HCT: 36.3 % — ABNORMAL LOW (ref 39.0–52.0)
Hemoglobin: 12.1 g/dL — ABNORMAL LOW (ref 13.0–17.0)
Immature Granulocytes: 0 %
Lymphocytes Relative: 31 %
Lymphs Abs: 1.7 10*3/uL (ref 0.7–4.0)
MCH: 29.7 pg (ref 26.0–34.0)
MCHC: 33.3 g/dL (ref 30.0–36.0)
MCV: 89.2 fL (ref 80.0–100.0)
Monocytes Absolute: 0.5 10*3/uL (ref 0.1–1.0)
Monocytes Relative: 10 %
Neutro Abs: 2.7 10*3/uL (ref 1.7–7.7)
Neutrophils Relative %: 51 %
Platelet Count: 320 10*3/uL (ref 150–400)
RBC: 4.07 MIL/uL — ABNORMAL LOW (ref 4.22–5.81)
RDW: 14.6 % (ref 11.5–15.5)
WBC Count: 5.3 10*3/uL (ref 4.0–10.5)
nRBC: 0 % (ref 0.0–0.2)

## 2020-03-06 LAB — MAGNESIUM: Magnesium: 1.8 mg/dL (ref 1.7–2.4)

## 2020-03-06 LAB — GENETIC SCREENING ORDER

## 2020-03-06 MED ORDER — DEXAMETHASONE SODIUM PHOSPHATE 10 MG/ML IJ SOLN
5.0000 mg | Freq: Once | INTRAMUSCULAR | Status: AC
  Administered 2020-03-06: 5 mg via INTRAVENOUS

## 2020-03-06 MED ORDER — SODIUM CHLORIDE 0.9 % IV SOLN
25.0000 mg/m2 | Freq: Once | INTRAVENOUS | Status: AC
  Administered 2020-03-06: 56 mg via INTRAVENOUS
  Filled 2020-03-06: qty 56

## 2020-03-06 MED ORDER — SODIUM CHLORIDE 0.9 % IV SOLN: 5.0000 mg | Freq: Once | INTRAVENOUS | Status: DC

## 2020-03-06 MED ORDER — HEPARIN SOD (PORK) LOCK FLUSH 100 UNIT/ML IV SOLN
500.0000 [IU] | Freq: Once | INTRAVENOUS | Status: AC | PRN
  Administered 2020-03-06: 500 [IU]
  Filled 2020-03-06: qty 5

## 2020-03-06 MED ORDER — MAGNESIUM SULFATE 2 GM/50ML IV SOLN
2.0000 g | Freq: Once | INTRAVENOUS | Status: AC
  Administered 2020-03-06: 2 g via INTRAVENOUS

## 2020-03-06 MED ORDER — FAMOTIDINE IN NACL 20-0.9 MG/50ML-% IV SOLN
INTRAVENOUS | Status: AC
  Filled 2020-03-06: qty 50

## 2020-03-06 MED ORDER — SODIUM CHLORIDE 0.9 % IV SOLN
150.0000 mg | Freq: Once | INTRAVENOUS | Status: AC
  Administered 2020-03-06: 150 mg via INTRAVENOUS
  Filled 2020-03-06: qty 150

## 2020-03-06 MED ORDER — PALONOSETRON HCL INJECTION 0.25 MG/5ML
INTRAVENOUS | Status: AC
  Filled 2020-03-06: qty 5

## 2020-03-06 MED ORDER — DEXAMETHASONE SODIUM PHOSPHATE 10 MG/ML IJ SOLN
INTRAMUSCULAR | Status: AC
  Filled 2020-03-06: qty 1

## 2020-03-06 MED ORDER — SODIUM CHLORIDE 0.9 % IV SOLN
1000.0000 mg/m2 | Freq: Once | INTRAVENOUS | Status: AC
  Administered 2020-03-06: 2242 mg via INTRAVENOUS
  Filled 2020-03-06: qty 58.97

## 2020-03-06 MED ORDER — FAMOTIDINE IN NACL 20-0.9 MG/50ML-% IV SOLN
20.0000 mg | Freq: Once | INTRAVENOUS | Status: AC
  Administered 2020-03-06: 20 mg via INTRAVENOUS

## 2020-03-06 MED ORDER — MAGNESIUM SULFATE 2 GM/50ML IV SOLN
INTRAVENOUS | Status: AC
  Filled 2020-03-06: qty 50

## 2020-03-06 MED ORDER — POTASSIUM CHLORIDE IN NACL 20-0.9 MEQ/L-% IV SOLN
Freq: Once | INTRAVENOUS | Status: AC
  Filled 2020-03-06: qty 1000

## 2020-03-06 MED ORDER — SODIUM CHLORIDE 0.9 % IV SOLN
Freq: Once | INTRAVENOUS | Status: AC
  Filled 2020-03-06: qty 250

## 2020-03-06 MED ORDER — SODIUM CHLORIDE 0.9% FLUSH
10.0000 mL | INTRAVENOUS | Status: DC | PRN
  Administered 2020-03-06: 10 mL
  Filled 2020-03-06: qty 10

## 2020-03-06 MED ORDER — PALONOSETRON HCL INJECTION 0.25 MG/5ML
0.2500 mg | Freq: Once | INTRAVENOUS | Status: AC
  Administered 2020-03-06: 0.25 mg via INTRAVENOUS

## 2020-03-06 NOTE — Progress Notes (Signed)
Brief nutrition follow-up completed with patient during infusion for cholangiocarcinoma. Patient denies nausea and vomiting. Reports good appetite and is feeling well. He does have a little constipation. Weight improved and documented as 224.3 pounds January 31.  This is increased from 215.3 pounds January 10.  Nutrition diagnosis: Unintended weight loss resolved.  Provided support and encouragement.  Patient to continue strategies for adequate calorie and protein intake. We will contact RD with further questions and concerns.  **Disclaimer: This note was dictated with voice recognition software. Similar sounding words can inadvertently be transcribed and this note may contain transcription errors which may not have been corrected upon publication of note.**

## 2020-03-06 NOTE — Progress Notes (Signed)
[facility] OFFICE PROGRESS NOTE   Diagnosis: Cholangiocarcinoma  INTERVAL HISTORY:   Mr. Levins completed day 1 of cycle 2 chemotherapy on 02/28/2020.  No nausea/vomiting, rash, or fever.  He reports noting a lump at the left anterior neck beginning on 03/04/2020.  He reports having intermittent enlargement of the lymph node in this area for years.  He noted numbness at the left side of his tongue on 03/04/2020.  This has resolved.  Good appetite.  He is gaining weight.  His insulin was recently adjusted by Dr. Pickard.  Objective:  Vital signs in last 24 hours:  Blood pressure 130/90, pulse 72, temperature (!) 97.2 F (36.2 C), temperature source Tympanic, resp. rate 18, height 6' 2" (1.88 m), weight 224 lb 4.8 oz (101.7 kg), SpO2 98 %.    HEENT: No thrush or ulcers, neck without mass Lymphatics: No cervical or supraclavicular nodes Resp: Lungs clear bilaterally Cardio: Regular rate and rhythm GI: No mass, nontender, no hepatosplenomegaly Vascular: No leg edema  Skin: No rash  Portacath/PICC-without erythema  Lab Results:  Lab Results  Component Value Date   WBC 5.3 03/06/2020   HGB 12.1 (L) 03/06/2020   HCT 36.3 (L) 03/06/2020   MCV 89.2 03/06/2020   PLT 320 03/06/2020   NEUTROABS 2.7 03/06/2020    CMP  Lab Results  Component Value Date   NA 135 02/28/2020   K 4.5 02/28/2020   CL 104 02/28/2020   CO2 26 02/28/2020   GLUCOSE 215 (H) 02/28/2020   BUN 16 02/28/2020   CREATININE 1.05 02/28/2020   CALCIUM 9.2 02/28/2020   PROT 7.7 02/28/2020   ALBUMIN 3.8 02/28/2020   AST 35 02/28/2020   ALT 32 02/28/2020   ALKPHOS 233 (H) 02/28/2020   BILITOT 0.5 02/28/2020   GFRNONAA >60 02/28/2020   GFRAA 127 01/11/2020    Lab Results  Component Value Date   CEA1 2.15 01/14/2020    Medications: I have reviewed the patient's current medications.   Assessment/Plan: 1. Cholangiocarcinoma  multiple liver masses and abdominal lymphadenopathy ? CTs  01/13/2020-rounded hypodense mass appears to arise from the pancreas neck, multiple rim-enhancing masses in the liver, primarily left liver with segmental dilation of the left lobe Intermatic bile ducts, ill-defined hypodensity the central liver with effacement of the left portal vein, enlarged portacaval and retroperitoneal lymph nodes ? Ultrasound-guided biopsy of the left liver lesion 01/19/2020-adenocarcinoma, cytokeratin 7+, MSS, tumor mutation burden 1, IDH1 R132C ? MRI abdomen 01/31/2020-poorly marginated central liver mass, multiple smaller similar satellite liver masses, extrinsic mass-effect at the biliary hilum with intrahepatic biliary ductal dilatation throughout the left liver with mild Intermatic dilatation the superior right liver, no pancreas mass or ductal dilatation, normal spleen size, numerous enlarged enhancing lymph nodes at the porta hepatis, peripancreatic, portacaval, aortocaval, left periaortic chains ? Cycle 1 gemcitabine/cisplatin 02/04/2020 ? Cycle 2 gemcitabine/cisplatin 02/28/2020  2. Cough-likely related to diaphragmatic irritation from #1 3. Anorexia/weight loss 4. Hypercalcemia-likely hypercalcemia malignancy, status post intravenous hydration and Zometa 01/14/2020 5. Diabetes 6. Hyperlipidemia 7. Hypertension 8. History of peripheral neuropathy secondary to diabetes 9. Severe back pain following Fulphila-oxycodone prescribed     Disposition: [name] completed day 1 cycle 2 gemcitabine/cisplatin on 02/28/2020.  He tolerated the chemotherapy well.  He will complete day 8 chemotherapy today.  He reports a good appetite.  His cough has resolved.  He has no pain at present.    He will return for an office visit and cycle 3 chemotherapy on 03/20/2020.    He will be scheduled for restaging CTs after cycle 3.  There is no palpable abnormality at the left neck on exam today.  Gary Sherrill, MD  03/06/2020  10:09 AM   

## 2020-03-07 ENCOUNTER — Inpatient Hospital Stay: Payer: No Typology Code available for payment source

## 2020-03-07 ENCOUNTER — Other Ambulatory Visit: Payer: Self-pay

## 2020-03-07 ENCOUNTER — Telehealth: Payer: Self-pay | Admitting: Oncology

## 2020-03-07 ENCOUNTER — Inpatient Hospital Stay: Payer: No Typology Code available for payment source | Attending: Oncology

## 2020-03-07 VITALS — BP 132/54 | HR 72 | Temp 98.3°F | Resp 16

## 2020-03-07 DIAGNOSIS — Z5111 Encounter for antineoplastic chemotherapy: Secondary | ICD-10-CM | POA: Insufficient documentation

## 2020-03-07 DIAGNOSIS — Z5189 Encounter for other specified aftercare: Secondary | ICD-10-CM | POA: Insufficient documentation

## 2020-03-07 DIAGNOSIS — L988 Other specified disorders of the skin and subcutaneous tissue: Secondary | ICD-10-CM | POA: Diagnosis not present

## 2020-03-07 DIAGNOSIS — C221 Intrahepatic bile duct carcinoma: Secondary | ICD-10-CM | POA: Diagnosis present

## 2020-03-07 MED ORDER — PEGFILGRASTIM-JMDB 6 MG/0.6ML ~~LOC~~ SOSY
6.0000 mg | PREFILLED_SYRINGE | Freq: Once | SUBCUTANEOUS | Status: AC
  Administered 2020-03-07: 6 mg via SUBCUTANEOUS

## 2020-03-07 MED FILL — UNIFINE PENTIPS 31GX3/16: 31G X 5 MM | 25 days supply | Qty: 100 | Fill #1

## 2020-03-07 MED FILL — oxyCODONE HCL 5 MG TABS: 5 | 3 days supply | Qty: 10 | Fill #0

## 2020-03-07 NOTE — Telephone Encounter (Signed)
Scheduled appointments per 1/31 los. Spoke to patient's wife who is aware of appointments dates and times.  

## 2020-03-07 NOTE — Telephone Encounter (Signed)
I have called the pharmacy back and they have informed me there is no such thing as the Dexcom kit. They have individual parts. I have called in a verbal to the pharmacisit for 6 month supply. This includes the sensor which comes in a pack of 3 (#9 + 1 refill) , the transmitter comes in 90 (#1 + 1 refill) and lastly the reader only one (this is optional since most people can use their smart phones for this).  

## 2020-03-08 ENCOUNTER — Inpatient Hospital Stay: Payer: No Typology Code available for payment source

## 2020-03-14 ENCOUNTER — Telehealth: Payer: Self-pay | Admitting: Family Medicine

## 2020-03-14 NOTE — Telephone Encounter (Signed)
Medication is not prescribed by [facility].   No PA will be attempted

## 2020-03-14 NOTE — Telephone Encounter (Signed)
Pt wife called stating that their pharmacy has faxed over the prior authorization for  dexamethasone (DECADRON) injection 5 mg    On 03/03/2020. Please call  Cb#:[phone]

## 2020-03-19 ENCOUNTER — Other Ambulatory Visit: Payer: Self-pay | Admitting: Oncology

## 2020-03-20 ENCOUNTER — Inpatient Hospital Stay: Payer: No Typology Code available for payment source

## 2020-03-20 ENCOUNTER — Inpatient Hospital Stay (HOSPITAL_BASED_OUTPATIENT_CLINIC_OR_DEPARTMENT_OTHER): Payer: No Typology Code available for payment source | Admitting: Oncology

## 2020-03-20 ENCOUNTER — Encounter: Payer: Self-pay | Admitting: General Practice

## 2020-03-20 ENCOUNTER — Telehealth: Payer: Self-pay

## 2020-03-20 ENCOUNTER — Other Ambulatory Visit: Payer: Self-pay

## 2020-03-20 VITALS — BP 124/83 | HR 67 | Temp 97.8°F | Resp 18 | Ht 74.0 in | Wt 230.1 lb

## 2020-03-20 DIAGNOSIS — C221 Intrahepatic bile duct carcinoma: Secondary | ICD-10-CM

## 2020-03-20 DIAGNOSIS — Z95828 Presence of other vascular implants and grafts: Secondary | ICD-10-CM

## 2020-03-20 DIAGNOSIS — Z5111 Encounter for antineoplastic chemotherapy: Secondary | ICD-10-CM | POA: Diagnosis not present

## 2020-03-20 LAB — CMP (CANCER CENTER ONLY)
ALT: 27 U/L (ref 0–44)
AST: 31 U/L (ref 15–41)
Albumin: 4 g/dL (ref 3.5–5.0)
Alkaline Phosphatase: 199 U/L — ABNORMAL HIGH (ref 38–126)
Anion gap: 5 (ref 5–15)
BUN: 11 mg/dL (ref 6–20)
CO2: 28 mmol/L (ref 22–32)
Calcium: 9.6 mg/dL (ref 8.9–10.3)
Chloride: 102 mmol/L (ref 98–111)
Creatinine: 0.88 mg/dL (ref 0.61–1.24)
GFR, Estimated: >60 mL/min (ref >60)
Glucose, Bld: 207 mg/dL — ABNORMAL HIGH (ref 70–99)
Potassium: 4.5 mmol/L (ref 3.5–5.1)
Sodium: 135 mmol/L (ref 135–145)
Total Bilirubin: 0.5 mg/dL (ref 0.3–1.2)
Total Protein: 7.6 g/dL (ref 6.5–8.1)

## 2020-03-20 LAB — CBC WITH DIFFERENTIAL (CANCER CENTER ONLY)
Abs Immature Granulocytes: 0.04 10*3/uL (ref 0.00–0.07)
Basophils Absolute: 0.1 10*3/uL (ref 0.0–0.1)
Basophils Relative: 1 %
Eosinophils Absolute: 0.9 10*3/uL — ABNORMAL HIGH (ref 0.0–0.5)
Eosinophils Relative: 8 %
HCT: 37.4 % — ABNORMAL LOW (ref 39.0–52.0)
Hemoglobin: 12.6 g/dL — ABNORMAL LOW (ref 13.0–17.0)
Immature Granulocytes: 0 %
Lymphocytes Relative: 16 %
Lymphs Abs: 1.7 10*3/uL (ref 0.7–4.0)
MCH: 31.1 pg (ref 26.0–34.0)
MCHC: 33.7 g/dL (ref 30.0–36.0)
MCV: 92.3 fL (ref 80.0–100.0)
Monocytes Absolute: 0.9 10*3/uL (ref 0.1–1.0)
Monocytes Relative: 9 %
Neutro Abs: 7.1 10*3/uL (ref 1.7–7.7)
Neutrophils Relative %: 66 %
Platelet Count: 206 10*3/uL (ref 150–400)
RBC: 4.05 MIL/uL — ABNORMAL LOW (ref 4.22–5.81)
RDW: 17 % — ABNORMAL HIGH (ref 11.5–15.5)
WBC Count: 10.7 10*3/uL — ABNORMAL HIGH (ref 4.0–10.5)
nRBC: 0 % (ref 0.0–0.2)

## 2020-03-20 LAB — MAGNESIUM: Magnesium: 2 mg/dL (ref 1.7–2.4)

## 2020-03-20 MED ORDER — DEXAMETHASONE SODIUM PHOSPHATE 10 MG/ML IJ SOLN
INTRAMUSCULAR | Status: AC
  Filled 2020-03-20: qty 1

## 2020-03-20 MED ORDER — SODIUM CHLORIDE 0.9% FLUSH
10.0000 mL | INTRAVENOUS | Status: DC | PRN
  Administered 2020-03-20: 10 mL
  Filled 2020-03-20: qty 10

## 2020-03-20 MED ORDER — SODIUM CHLORIDE 0.9 % IV SOLN
Freq: Once | INTRAVENOUS | Status: AC
  Filled 2020-03-20: qty 250

## 2020-03-20 MED ORDER — SODIUM CHLORIDE 0.9 % IV SOLN
150.0000 mg | Freq: Once | INTRAVENOUS | Status: AC
  Administered 2020-03-20: 150 mg via INTRAVENOUS
  Filled 2020-03-20: qty 150

## 2020-03-20 MED ORDER — FAMOTIDINE IN NACL 20-0.9 MG/50ML-% IV SOLN
INTRAVENOUS | Status: AC
  Filled 2020-03-20: qty 50

## 2020-03-20 MED ORDER — DEXAMETHASONE SODIUM PHOSPHATE 10 MG/ML IJ SOLN
5.0000 mg | Freq: Once | INTRAMUSCULAR | Status: AC
  Administered 2020-03-20: 5 mg via INTRAVENOUS

## 2020-03-20 MED ORDER — PALONOSETRON HCL INJECTION 0.25 MG/5ML
INTRAVENOUS | Status: AC
  Filled 2020-03-20: qty 5

## 2020-03-20 MED ORDER — MAGNESIUM SULFATE 2 GM/50ML IV SOLN
2.0000 g | Freq: Once | INTRAVENOUS | Status: AC
Start: 2020-03-20 — End: 2020-03-20
  Administered 2020-03-20: 2 g via INTRAVENOUS

## 2020-03-20 MED ORDER — SODIUM CHLORIDE 0.9 % IV SOLN
1000.0000 mg/m2 | Freq: Once | INTRAVENOUS | Status: AC
  Administered 2020-03-20: 2242 mg via INTRAVENOUS
  Filled 2020-03-20: qty 58.97

## 2020-03-20 MED ORDER — HEPARIN SOD (PORK) LOCK FLUSH 100 UNIT/ML IV SOLN
500.0000 [IU] | Freq: Once | INTRAVENOUS | Status: AC | PRN
  Administered 2020-03-20: 500 [IU]
  Filled 2020-03-20: qty 5

## 2020-03-20 MED ORDER — MAGNESIUM SULFATE 2 GM/50ML IV SOLN
INTRAVENOUS | Status: AC
  Filled 2020-03-20: qty 50

## 2020-03-20 MED ORDER — PALONOSETRON HCL INJECTION 0.25 MG/5ML
0.2500 mg | Freq: Once | INTRAVENOUS | Status: AC
Start: 2020-03-20 — End: 2020-03-20
  Administered 2020-03-20: 0.25 mg via INTRAVENOUS

## 2020-03-20 MED ORDER — POTASSIUM CHLORIDE IN NACL 20-0.9 MEQ/L-% IV SOLN: Freq: Once | INTRAVENOUS | Status: DC

## 2020-03-20 MED ORDER — FAMOTIDINE IN NACL 20-0.9 MG/50ML-% IV SOLN
20.0000 mg | Freq: Once | INTRAVENOUS | Status: AC
  Administered 2020-03-20: 20 mg via INTRAVENOUS

## 2020-03-20 MED ORDER — POTASSIUM CHLORIDE 10 MEQ/100ML IV SOLN
INTRAVENOUS | Status: AC
  Filled 2020-03-20: qty 100

## 2020-03-20 MED ORDER — SODIUM CHLORIDE 0.9% FLUSH
10.0000 mL | INTRAVENOUS | Status: DC | PRN
  Administered 2020-03-20: 10 mL via INTRAVENOUS
  Filled 2020-03-20: qty 10

## 2020-03-20 MED ORDER — POTASSIUM CHLORIDE 10 MEQ/100ML IV SOLN
10.0000 meq | INTRAVENOUS | Status: AC
  Administered 2020-03-20 (×2): 10 meq via INTRAVENOUS

## 2020-03-20 MED ORDER — SODIUM CHLORIDE 0.9 % IV SOLN
25.0000 mg/m2 | Freq: Once | INTRAVENOUS | Status: AC
  Administered 2020-03-20: 56 mg via INTRAVENOUS
  Filled 2020-03-20: qty 56

## 2020-03-20 MED ORDER — SODIUM CHLORIDE 0.9 % IV SOLN
Freq: Once | INTRAVENOUS | Status: AC
  Filled 2020-03-20: qty 1000

## 2020-03-20 MED FILL — LIDOCAINE-PRILOCAINE CREAM: 2.5-2.5 | 30 days supply | Qty: 30 | Fill #1

## 2020-03-20 NOTE — Progress Notes (Signed)
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 He continues to take one day at a time, trying to slow down, be present, and enjoy as much as possible. We plan to follow up at his next treatment.   [place], [place], [facility] Pager [phone] Voicemail [phone]

## 2020-03-20 NOTE — Telephone Encounter (Signed)
Spoke with wife of pt, waiting for the approval on the glucose monitoring machine to be approved. Needs to be filled out by pcp. Communications have been faxed to office several times regarding this per pharmacy and wife of pt. Informed wife that the fax machine is down. Wife says she will pick up forms and drop them off to the office

## 2020-03-20 NOTE — Progress Notes (Signed)
[facility] OFFICE PROGRESS NOTE   Diagnosis: Cholangiocarcinoma  INTERVAL HISTORY:   Matthew Osborne completed another cycle of gemcitabine/cisplatin beginning 02/28/2020.  No fever, rash, nausea/vomiting, or cough.  He has mild intermittent numbness in the feet.  This does not interfere with activity. He saw Dr. Oralia Rud for a second opinion.  She agrees with continuing gemcitabine/cisplatin.  She discussed recent data supporting use of durvalumab in combination with chemotherapy, but this is not yet FDA approved.  He had a headache for a few days following G-CSF.  He did not develop significant bone pain.  He has noted a "knot "in the right axilla.  Objective:  Vital signs in last 24 hours:  Blood pressure 124/83, pulse 67, temperature 97.8 F (36.6 C), resp. rate 18, height 6' 2" (1.88 m), weight 230 lb 1.6 oz (104.4 kg), SpO2 98 %.    HEENT: No thrush or ulcers Lymphatics: No cervical, supraclavicular, or axillary nodes. Resp: Lungs clear bilaterally Cardio: Regular rate and rhythm GI: Nontender, no hepatosplenomegaly Vascular: No leg edema  Skin: 1 cm soft cutaneous cystic lesion in the right axilla with associated tenderness  Portacath/PICC-without erythema  Lab Results:  Lab Results  Component Value Date   WBC 10.7 (H) 03/20/2020   HGB 12.6 (L) 03/20/2020   HCT 37.4 (L) 03/20/2020   MCV 92.3 03/20/2020   PLT 206 03/20/2020   NEUTROABS 7.1 03/20/2020    CMP  Lab Results  Component Value Date   NA 137 03/06/2020   K 4.2 03/06/2020   CL 102 03/06/2020   CO2 29 03/06/2020   GLUCOSE 114 (H) 03/06/2020   BUN 12 03/06/2020   CREATININE 0.78 03/06/2020   CALCIUM 9.6 03/06/2020   PROT 7.5 03/06/2020   ALBUMIN 3.8 03/06/2020   AST 32 03/06/2020   ALT 37 03/06/2020   ALKPHOS 187 (H) 03/06/2020   BILITOT 0.7 03/06/2020   GFRNONAA >60 03/06/2020   GFRAA 127 01/11/2020    Lab Results  Component Value Date   CEA1 2.15 01/14/2020    Medications: I  have reviewed the patient's current medications.   Assessment/Plan: 1. Cholangiocarcinoma  multiple liver masses and abdominal lymphadenopathy ? CTs 01/13/2020-rounded hypodense mass appears to arise from the pancreas neck, multiple rim-enhancing masses in the liver, primarily left liver with segmental dilation of the left lobe Intermatic bile ducts, ill-defined hypodensity the central liver with effacement of the left portal vein, enlarged portacaval and retroperitoneal lymph nodes ? Ultrasound-guided biopsy of the left liver lesion 01/19/2020-adenocarcinoma, cytokeratin 7+, MSS, tumor mutation burden 1, IDH1 R132C ? MRI abdomen 01/31/2020-poorly marginated central liver mass, multiple smaller similar satellite liver masses, extrinsic mass-effect at the biliary hilum with intrahepatic biliary ductal dilatation throughout the left liver with mild Intermatic dilatation the superior right liver, no pancreas mass or ductal dilatation, normal spleen size, numerous enlarged enhancing lymph nodes at the porta hepatis, peripancreatic, portacaval, aortocaval, left periaortic chains ? Cycle 1 gemcitabine/cisplatin 02/04/2020 ? Cycle 2 gemcitabine/cisplatin 02/28/2020 ? Cycle 3 gemcitabine/cisplatin 03/20/2020  2. Cough-likely related to diaphragmatic irritation from #1 3. Anorexia/weight loss 4. Hypercalcemia-likely hypercalcemia malignancy, status post intravenous hydration and Zometa 01/14/2020 5. Diabetes 6. Hyperlipidemia 7. Hypertension 8. History of peripheral neuropathy secondary to diabetes 9. Severe back pain following Fulphila-oxycodone prescribed     Disposition: Mr. Weidinger appears stable.  He has completed 2 cycles of gemcitabine/cisplatin.  He has tolerated the chemotherapy well.  He will complete cycle 3 beginning today.  He will undergo a restaging CT evaluation  after this cycle.  The CT will be scheduled for 03/31/2020 due to a planned vacation during the week of 04/02/2020.  He will  return for an office visit and cycle 4 chemotherapy on 04/10/2020.  Betsy Coder, MD  03/20/2020  9:22 AM

## 2020-03-20 NOTE — Patient Instructions (Signed)
Implanted Port Insertion, Care After This sheet gives you information about how to care for yourself after your procedure. Your health care provider may also give you more specific instructions. If you have problems or questions, contact your health care provider. What can I expect after the procedure? After the procedure, it is common to have:  Discomfort at the port insertion site.  Bruising on the skin over the port. This should improve over 3-4 days. Follow these instructions at home: Port care  After your port is placed, you will get a manufacturer's information card. The card has information about your port. Keep this card with you at all times.  Take care of the port as told by your health care provider. Ask your health care provider if you or a family member can get training for taking care of the port at home. A home health care nurse may also take care of the port.  Make sure to remember what type of port you have. Incision care  Follow instructions from your health care provider about how to take care of your port insertion site. Make sure you: ? Wash your hands with soap and water before and after you change your bandage (dressing). If soap and water are not available, use hand sanitizer. ? Change your dressing as told by your health care provider. ? Leave stitches (sutures), skin glue, or adhesive strips in place. These skin closures may need to stay in place for 2 weeks or longer. If adhesive strip edges start to loosen and curl up, you may trim the loose edges. Do not remove adhesive strips completely unless your health care provider tells you to do that.  Check your port insertion site every day for signs of infection. Check for: ? Redness, swelling, or pain. ? Fluid or blood. ? Warmth. ? Pus or a bad smell.      Activity  Return to your normal activities as told by your health care provider. Ask your health care provider what activities are safe for you.  Do not  lift anything that is heavier than 10 lb (4.5 kg), or the limit that you are told, until your health care provider says that it is safe. General instructions  Take over-the-counter and prescription medicines only as told by your health care provider.  Do not take baths, swim, or use a hot tub until your health care provider approves. Ask your health care provider if you may take showers. You may only be allowed to take sponge baths.  Do not drive for 24 hours if you were given a sedative during your procedure.  Wear a medical alert bracelet in case of an emergency. This will tell any health care providers that you have a port.  Keep all follow-up visits as told by your health care provider. This is important. Contact a health care provider if:  You cannot flush your port with saline as directed, or you cannot draw blood from the port.  You have a fever or chills.  You have redness, swelling, or pain around your port insertion site.  You have fluid or blood coming from your port insertion site.  Your port insertion site feels warm to the touch.  You have pus or a bad smell coming from the port insertion site. Get help right away if:  You have chest pain or shortness of breath.  You have bleeding from your port that you cannot control. Summary  Take care of the port as told by your   health care provider. Keep the manufacturer's information card with you at all times.  Change your dressing as told by your health care provider.  Contact a health care provider if you have a fever or chills or if you have redness, swelling, or pain around your port insertion site.  Keep all follow-up visits as told by your health care provider. This information is not intended to replace advice given to you by your health care provider. Make sure you discuss any questions you have with your health care provider. Document Revised: 08/19/2017 Document Reviewed: 08/19/2017 Elsevier Patient Education   2021 [employer] Inc.  

## 2020-03-20 NOTE — Patient Instructions (Signed)
[facility] Discharge Instructions for Patients Receiving Chemotherapy  Today you received the following chemotherapy agents Gemzar; Cisplatin  To help prevent nausea and vomiting after your treatment, we encourage you to take your nausea medication as directed If you develop nausea and vomiting that is not controlled by your nausea medication, call the clinic.   BELOW ARE SYMPTOMS THAT SHOULD BE REPORTED IMMEDIATELY:  *FEVER GREATER THAN 100.5 F  *CHILLS WITH OR WITHOUT FEVER  NAUSEA AND VOMITING THAT IS NOT CONTROLLED WITH YOUR NAUSEA MEDICATION  *UNUSUAL SHORTNESS OF BREATH  *UNUSUAL BRUISING OR BLEEDING  TENDERNESS IN MOUTH AND THROAT WITH OR WITHOUT PRESENCE OF ULCERS  *URINARY PROBLEMS  *BOWEL PROBLEMS  UNUSUAL RASH Items with * indicate a potential emergency and should be followed up as soon as possible.  Feel free to call the clinic should you have any questions or concerns. The clinic phone number is [phone].  Please show the CHEMO ALERT CARD at check-in to the Emergency Department and triage nurse.   

## 2020-03-21 ENCOUNTER — Other Ambulatory Visit: Payer: Self-pay | Admitting: *Deleted

## 2020-03-21 ENCOUNTER — Other Ambulatory Visit: Payer: Self-pay | Admitting: Family Medicine

## 2020-03-21 MED ORDER — BASAGLAR KWIKPEN 100 UNIT/ML ~~LOC~~ SOPN: 70.0000 [IU] | PEN_INJECTOR | Freq: Two times a day (BID) | SUBCUTANEOUS | 3 refills | Status: DC

## 2020-03-21 MED FILL — BASAGLAR 100 UNIT/ML KWIKPE: 100 | 90 days supply | Qty: 96 | Fill #0

## 2020-03-22 ENCOUNTER — Telehealth: Payer: Self-pay

## 2020-03-24 ENCOUNTER — Ambulatory Visit: Payer: Self-pay | Admitting: Genetic Counselor

## 2020-03-24 ENCOUNTER — Encounter: Payer: Self-pay | Admitting: Genetic Counselor

## 2020-03-24 ENCOUNTER — Telehealth: Payer: Self-pay | Admitting: Genetic Counselor

## 2020-03-24 DIAGNOSIS — Z1379 Encounter for other screening for genetic and chromosomal anomalies: Secondary | ICD-10-CM

## 2020-03-24 NOTE — Telephone Encounter (Addendum)
Revealed negative genetic testing. Discussed that we do not know why he has cancer or why there is cancer in the family. It is possible that there could be a mutation in a different gene that we are not testing, or our current technology may not be able to detect certain mutations. It will therefore be important for him to stay in contact with genetics to keep up with whether additional testing may be appropriate in the future.   A variant of uncertain significance was detected in the ATM gene called p.[id number] ([id number]). His result is still considered normal at this time and should not impact his medical management.

## 2020-03-24 NOTE — Progress Notes (Signed)
HPI:  Mr. Humphres was previously seen in the [place] clinic due to a personal and family history of cancer and concerns regarding a hereditary predisposition to cancer. Please refer to our prior cancer genetics clinic note for more information regarding our discussion, assessment and recommendations, at the time. Mr. Marrow recent genetic test results were disclosed to him, as were recommendations warranted by these results. These results and recommendations are discussed in more detail below.  CANCER HISTORY:  Oncology History  Hypercalcemia  Pancreas cancer ([place])  01/21/2020 Initial Diagnosis   Pancreas cancer ([place])   01/21/2020 Cancer Staging   Staging form: Exocrine Pancreas, AJCC 8th Edition - Clinical: Stage IV (cM1) - Signed by Ladell Pier, MD on 01/21/2020   Cholangiocarcinoma ([place])  02/01/2020 Initial Diagnosis   Cholangiocarcinoma ([place])   02/04/2020 -  Chemotherapy    Patient is on Treatment Plan: BILIARY TRACT CISPLATIN + GEMCITABINE D1,8 Q21D      03/24/2020 Genetic Testing   Negative genetic testing:  No pathogenic variants detected on the [employer] CancerNext-Expanded + RNAinsight panel. A variant of uncertain significance (VUS) was detected in the ATM gene called p.R2854C (c.8560C>T). The report date is 03/24/2020.  The CancerNext-Expanded + RNAinsight gene panel offered by [employer] and includes sequencing and rearrangement analysis for the following 77 genes: AIP, ALK, APC, ATM, AXIN2, BAP1, BARD1, BLM, BMPR1A, BRCA1, BRCA2, BRIP1, CDC73, CDH1, CDK4, CDKN1B, CDKN2A, CHEK2, CTNNA1, DICER1, FANCC, FH, FLCN, GALNT12, KIF1B, LZTR1, MAX, MEN1, MET, MLH1, MSH2, MSH3, MSH6, MUTYH, NBN, NF1, NF2, NTHL1, PALB2, PHOX2B, PMS2, POT1, PRKAR1A, PTCH1, PTEN, RAD51C, RAD51D, RB1, RECQL, RET, SDHA, SDHAF2, SDHB, SDHC, SDHD, SMAD4, SMARCA4, SMARCB1, SMARCE1, STK11, SUFU, TMEM127, TP53, TSC1, TSC2, VHL and XRCC2 (sequencing and deletion/duplication); EGFR, EGLN1,  HOXB13, KIT, MITF, PDGFRA, POLD1 and POLE (sequencing only); EPCAM and GREM1 (deletion/duplication only). RNA data is routinely analyzed for use in variant interpretation for all genes.      FAMILY HISTORY:  We obtained a detailed, 4-generation family history.  Significant diagnoses are listed below: Family History  Problem Relation Age of Onset  . Cancer Other   . Hyperlipidemia Other   . Hypertension Brother   . Asthma Son   . Lung cancer Maternal Grandfather   . Mesothelioma Maternal Grandfather        asbestos exposure  . Rheum arthritis Maternal Grandmother   . Breast cancer Maternal Grandmother 25  . Hodgkin's lymphoma Mother 28       30 lb tumor removed from abdomen  . Cirrhosis Paternal Grandmother   . Other Maternal Great-grandfather        black lung, worked in [employer] (MGF's father)     Mr. Haycraft has two sons and one daughter (ages 37-18). He has one brother and one sister (ages 84 and 13, respectively). None of these family members have had cancer.  Mr. Ta mother died at age 69 from Hodgkin's lymphoma (diagnosed age 4). He has one maternal uncle. His maternal grandmother died at age 4 and was diagnosed with breast cancer around age 59. His maternal grandfather was diagnosed with mesothelioma and had a history of mesothelioma exposure.  Mr. Sturges father is 90 and has not had cancer. Mr. Schoeneck has one paternal aunt and two paternal uncles. His paternal grandmother died at age 80 from cirrhosis of the liver. He does not have information about his paternal grandfather.   Mr. Marotto is unaware of previous family history of genetic testing for hereditary cancer risks. Patient's  maternal ancestors are of unknown and [place] Native American descent, and paternal ancestors are of [place] and [place] descent. There is no reported Ashkenazi Jewish ancestry. There is no known consanguinity.  GENETIC TEST RESULTS: Genetic testing reported out on 03/24/2020 through the  [employer] CancerNext-Expanded + RNAinsight panel. No pathogenic variants were detected.   The CancerNext-Expanded + RNAinsight gene panel offered by [employer] and includes sequencing and rearrangement analysis for the following 77 genes: AIP, ALK, APC, ATM, AXIN2, BAP1, BARD1, BLM, BMPR1A, BRCA1, BRCA2, BRIP1, CDC73, CDH1, CDK4, CDKN1B, CDKN2A, CHEK2, CTNNA1, DICER1, FANCC, FH, FLCN, GALNT12, KIF1B, LZTR1, MAX, MEN1, MET, MLH1, MSH2, MSH3, MSH6, MUTYH, NBN, NF1, NF2, NTHL1, PALB2, PHOX2B, PMS2, POT1, PRKAR1A, PTCH1, PTEN, RAD51C, RAD51D, RB1, RECQL, RET, SDHA, SDHAF2, SDHB, SDHC, SDHD, SMAD4, SMARCA4, SMARCB1, SMARCE1, STK11, SUFU, TMEM127, TP53, TSC1, TSC2, VHL and XRCC2 (sequencing and deletion/duplication); EGFR, EGLN1, HOXB13, KIT, MITF, PDGFRA, POLD1 and POLE (sequencing only); EPCAM and GREM1 (deletion/duplication only). RNA data is routinely analyzed for use in variant interpretation for all genes. The test report will be scanned into EPIC and located under the Molecular Pathology section of the Results Review tab.  A portion of the result report is included below for reference.      We discussed with Mr. Salvaggio that because current genetic testing is not perfect, it is possible there may be a gene mutation in one of these genes that current testing cannot detect, but that chance is small.  We also discussed that there could be another gene that has not yet been discovered, or that we have not yet tested, that is responsible for the cancer diagnoses in the family. It is also possible there is a hereditary cause for the cancer in the family that Mr. Portugal did not inherit and therefore was not identified in his testing.  Therefore, it is important to remain in touch with cancer genetics in the future so that we can continue to offer Mr. Persichetti the most up to date genetic testing.   Genetic testing did identify a variant of uncertain significance (VUS) in the ATM gene called c.8560C>T (p.R2854C). At  this time, it is unknown if this variant is associated with increased cancer risk or if this is a normal finding, but most variants such as this get reclassified to being inconsequential. It should not be used to make medical management decisions. With time, we suspect the lab will determine the significance of this variant, if any. If we do learn more about it, we will try to contact Mr. Thaw to discuss it further. However, it is important to stay in touch with [place] periodically and keep the address and phone number up to date.  CANCER SCREENING RECOMMENDATIONS: Mr. Luu test result is considered negative (normal).  This means that we have not identified a hereditary cause for his personal and family history of cancer at this time. Most cancers happen by chance and this negative test suggests that his personal and family history of cancer may fall into this category.    While reassuring, this does not definitively rule out a hereditary predisposition to cancer. It is still possible that there could be genetic mutations that are undetectable by current technology. There could be genetic mutations in genes that have not been tested or identified to increase cancer risk.  Therefore, it is recommended he continue to follow the cancer management and screening guidelines provided by his oncology and primary healthcare provider.   An individual's cancer risk and medical management  are not determined by genetic test results alone. Overall cancer risk assessment incorporates additional factors, including personal medical history, family history, and any available genetic information that may result in a personalized plan for cancer prevention and surveillance.  RECOMMENDATIONS FOR FAMILY MEMBERS:  Individuals in this family might be at some increased risk of developing cancer, over the general population risk, simply due to the family history of cancer.  We recommended women in this family have a yearly  mammogram beginning at age 9, or 64 years younger than the earliest onset of cancer, an annual clinical breast exam, and perform monthly breast self-exams. Women in this family should also have a gynecological exam as recommended by their primary provider. All family members should be referred for colonoscopy starting at age 23.  FOLLOW-UP: Lastly, we discussed with Mr. Aydt that cancer genetics is a rapidly advancing field and it is possible that new genetic tests will be appropriate for him and/or his family members in the future. We encouraged him to remain in contact with cancer genetics on an annual basis so we can update his personal and family histories and let him know of advances in cancer genetics that may benefit this family.   Our contact number was provided. Mr. Wellbrock questions were answered to his satisfaction, and he knows he is welcome to call us at anytime with additional questions or concerns.   Clint Guy, MS, [facility] Genetic Counselor [email or web address] Phone: [phone]

## 2020-03-27 ENCOUNTER — Inpatient Hospital Stay: Payer: No Typology Code available for payment source

## 2020-03-27 ENCOUNTER — Other Ambulatory Visit: Payer: Self-pay

## 2020-03-27 VITALS — BP 127/75 | HR 70 | Temp 98.1°F | Resp 18

## 2020-03-27 DIAGNOSIS — C221 Intrahepatic bile duct carcinoma: Secondary | ICD-10-CM

## 2020-03-27 DIAGNOSIS — Z95828 Presence of other vascular implants and grafts: Secondary | ICD-10-CM

## 2020-03-27 DIAGNOSIS — Z5111 Encounter for antineoplastic chemotherapy: Secondary | ICD-10-CM | POA: Diagnosis not present

## 2020-03-27 LAB — CBC WITH DIFFERENTIAL (CANCER CENTER ONLY)
Abs Immature Granulocytes: 0.03 10*3/uL (ref 0.00–0.07)
Basophils Absolute: 0.2 10*3/uL — ABNORMAL HIGH (ref 0.0–0.1)
Basophils Relative: 3 %
Eosinophils Absolute: 0.4 10*3/uL (ref 0.0–0.5)
Eosinophils Relative: 7 %
HCT: 36.5 % — ABNORMAL LOW (ref 39.0–52.0)
Hemoglobin: 12 g/dL — ABNORMAL LOW (ref 13.0–17.0)
Immature Granulocytes: 1 %
Lymphocytes Relative: 30 %
Lymphs Abs: 1.6 10*3/uL (ref 0.7–4.0)
MCH: 30.5 pg (ref 26.0–34.0)
MCHC: 32.9 g/dL (ref 30.0–36.0)
MCV: 92.6 fL (ref 80.0–100.0)
Monocytes Absolute: 0.6 10*3/uL (ref 0.1–1.0)
Monocytes Relative: 11 %
Neutro Abs: 2.6 10*3/uL (ref 1.7–7.7)
Neutrophils Relative %: 48 %
Platelet Count: 241 10*3/uL (ref 150–400)
RBC: 3.94 MIL/uL — ABNORMAL LOW (ref 4.22–5.81)
RDW: 16.7 % — ABNORMAL HIGH (ref 11.5–15.5)
WBC Count: 5.3 10*3/uL (ref 4.0–10.5)
nRBC: 0 % (ref 0.0–0.2)

## 2020-03-27 LAB — CMP (CANCER CENTER ONLY)
ALT: 36 U/L (ref 0–44)
AST: 32 U/L (ref 15–41)
Albumin: 4 g/dL (ref 3.5–5.0)
Alkaline Phosphatase: 172 U/L — ABNORMAL HIGH (ref 38–126)
Anion gap: 7 (ref 5–15)
BUN: 13 mg/dL (ref 6–20)
CO2: 28 mmol/L (ref 22–32)
Calcium: 9.6 mg/dL (ref 8.9–10.3)
Chloride: 100 mmol/L (ref 98–111)
Creatinine: 0.85 mg/dL (ref 0.61–1.24)
GFR, Estimated: >60 mL/min (ref >60)
Glucose, Bld: 212 mg/dL — ABNORMAL HIGH (ref 70–99)
Potassium: 4.5 mmol/L (ref 3.5–5.1)
Sodium: 135 mmol/L (ref 135–145)
Total Bilirubin: 0.5 mg/dL (ref 0.3–1.2)
Total Protein: 7.8 g/dL (ref 6.5–8.1)

## 2020-03-27 LAB — MAGNESIUM: Magnesium: 1.9 mg/dL (ref 1.7–2.4)

## 2020-03-27 MED ORDER — FAMOTIDINE IN NACL 20-0.9 MG/50ML-% IV SOLN
20.0000 mg | Freq: Once | INTRAVENOUS | Status: AC
  Administered 2020-03-27: 20 mg via INTRAVENOUS

## 2020-03-27 MED ORDER — POTASSIUM CHLORIDE IN NACL 20-0.9 MEQ/L-% IV SOLN
Freq: Once | INTRAVENOUS | Status: AC
  Filled 2020-03-27: qty 1000

## 2020-03-27 MED ORDER — PALONOSETRON HCL INJECTION 0.25 MG/5ML
INTRAVENOUS | Status: AC
  Filled 2020-03-27: qty 5

## 2020-03-27 MED ORDER — MAGNESIUM SULFATE 2 GM/50ML IV SOLN
INTRAVENOUS | Status: AC
  Filled 2020-03-27: qty 50

## 2020-03-27 MED ORDER — DEXAMETHASONE SODIUM PHOSPHATE 10 MG/ML IJ SOLN
INTRAMUSCULAR | Status: AC
  Filled 2020-03-27: qty 1

## 2020-03-27 MED ORDER — SODIUM CHLORIDE 0.9 % IV SOLN
25.0000 mg/m2 | Freq: Once | INTRAVENOUS | Status: AC
  Administered 2020-03-27: 56 mg via INTRAVENOUS
  Filled 2020-03-27: qty 56

## 2020-03-27 MED ORDER — SODIUM CHLORIDE 0.9 % IV SOLN
Freq: Once | INTRAVENOUS | Status: AC
  Filled 2020-03-27: qty 250

## 2020-03-27 MED ORDER — SODIUM CHLORIDE 0.9 % IV SOLN
1000.0000 mg/m2 | Freq: Once | INTRAVENOUS | Status: AC
  Administered 2020-03-27: 2242 mg via INTRAVENOUS
  Filled 2020-03-27: qty 58.97

## 2020-03-27 MED ORDER — MAGNESIUM SULFATE 2 GM/50ML IV SOLN
2.0000 g | Freq: Once | INTRAVENOUS | Status: AC
  Administered 2020-03-27: 2 g via INTRAVENOUS

## 2020-03-27 MED ORDER — SODIUM CHLORIDE 0.9 % IV SOLN
150.0000 mg | Freq: Once | INTRAVENOUS | Status: AC
  Administered 2020-03-27: 150 mg via INTRAVENOUS
  Filled 2020-03-27: qty 150

## 2020-03-27 MED ORDER — PALONOSETRON HCL INJECTION 0.25 MG/5ML
0.2500 mg | Freq: Once | INTRAVENOUS | Status: AC
  Administered 2020-03-27: 0.25 mg via INTRAVENOUS

## 2020-03-27 MED ORDER — SODIUM CHLORIDE 0.9% FLUSH
10.0000 mL | INTRAVENOUS | Status: DC | PRN
  Administered 2020-03-27: 10 mL via INTRAVENOUS
  Filled 2020-03-27: qty 10

## 2020-03-27 MED ORDER — DEXAMETHASONE SODIUM PHOSPHATE 10 MG/ML IJ SOLN
5.0000 mg | Freq: Once | INTRAMUSCULAR | Status: AC
  Administered 2020-03-27: 5 mg via INTRAVENOUS

## 2020-03-27 MED ORDER — FAMOTIDINE IN NACL 20-0.9 MG/50ML-% IV SOLN
INTRAVENOUS | Status: AC
  Filled 2020-03-27: qty 50

## 2020-03-27 NOTE — Patient Instructions (Signed)
[facility] Discharge Instructions for Patients Receiving Chemotherapy  Today you received the following chemotherapy agents Gemcitabine (GEMZAR) & Cisplatin (PLATINOL).  To help prevent nausea and vomiting after your treatment, we encourage you to take your nausea medication as prescribed.  If you develop nausea and vomiting that is not controlled by your nausea medication, call the clinic.   BELOW ARE SYMPTOMS THAT SHOULD BE REPORTED IMMEDIATELY:  *FEVER GREATER THAN 100.5 F  *CHILLS WITH OR WITHOUT FEVER  NAUSEA AND VOMITING THAT IS NOT CONTROLLED WITH YOUR NAUSEA MEDICATION  *UNUSUAL SHORTNESS OF BREATH  *UNUSUAL BRUISING OR BLEEDING  TENDERNESS IN MOUTH AND THROAT WITH OR WITHOUT PRESENCE OF ULCERS  *URINARY PROBLEMS  *BOWEL PROBLEMS  UNUSUAL RASH Items with * indicate a potential emergency and should be followed up as soon as possible.  Feel free to call the clinic should you have any questions or concerns. The clinic phone number is [phone].  Please show the CHEMO ALERT CARD at check-in to the Emergency Department and triage nurse.   

## 2020-03-27 NOTE — Progress Notes (Signed)
Pt discharged in no apparent distress. Pt left ambulatory without assistance. Pt aware of discharge instructions and verbalized understanding and had no further questions.  

## 2020-03-27 NOTE — Patient Instructions (Signed)
Implanted Port Insertion, Care After This sheet gives you information about how to care for yourself after your procedure. Your health care provider may also give you more specific instructions. If you have problems or questions, contact your health care provider. What can I expect after the procedure? After the procedure, it is common to have:  Discomfort at the port insertion site.  Bruising on the skin over the port. This should improve over 3-4 days. Follow these instructions at home: Port care  After your port is placed, you will get a manufacturer's information card. The card has information about your port. Keep this card with you at all times.  Take care of the port as told by your health care provider. Ask your health care provider if you or a family member can get training for taking care of the port at home. A home health care nurse may also take care of the port.  Make sure to remember what type of port you have. Incision care  Follow instructions from your health care provider about how to take care of your port insertion site. Make sure you: ? Wash your hands with soap and water before and after you change your bandage (dressing). If soap and water are not available, use hand sanitizer. ? Change your dressing as told by your health care provider. ? Leave stitches (sutures), skin glue, or adhesive strips in place. These skin closures may need to stay in place for 2 weeks or longer. If adhesive strip edges start to loosen and curl up, you may trim the loose edges. Do not remove adhesive strips completely unless your health care provider tells you to do that.  Check your port insertion site every day for signs of infection. Check for: ? Redness, swelling, or pain. ? Fluid or blood. ? Warmth. ? Pus or a bad smell.      Activity  Return to your normal activities as told by your health care provider. Ask your health care provider what activities are safe for you.  Do not  lift anything that is heavier than 10 lb (4.5 kg), or the limit that you are told, until your health care provider says that it is safe. General instructions  Take over-the-counter and prescription medicines only as told by your health care provider.  Do not take baths, swim, or use a hot tub until your health care provider approves. Ask your health care provider if you may take showers. You may only be allowed to take sponge baths.  Do not drive for 24 hours if you were given a sedative during your procedure.  Wear a medical alert bracelet in case of an emergency. This will tell any health care providers that you have a port.  Keep all follow-up visits as told by your health care provider. This is important. Contact a health care provider if:  You cannot flush your port with saline as directed, or you cannot draw blood from the port.  You have a fever or chills.  You have redness, swelling, or pain around your port insertion site.  You have fluid or blood coming from your port insertion site.  Your port insertion site feels warm to the touch.  You have pus or a bad smell coming from the port insertion site. Get help right away if:  You have chest pain or shortness of breath.  You have bleeding from your port that you cannot control. Summary  Take care of the port as told by your   health care provider. Keep the manufacturer's information card with you at all times.  Change your dressing as told by your health care provider.  Contact a health care provider if you have a fever or chills or if you have redness, swelling, or pain around your port insertion site.  Keep all follow-up visits as told by your health care provider. This information is not intended to replace advice given to you by your health care provider. Make sure you discuss any questions you have with your health care provider. Document Revised: 08/19/2017 Document Reviewed: 08/19/2017 Elsevier Patient Education   2021 [employer] Inc.  

## 2020-03-28 ENCOUNTER — Other Ambulatory Visit: Payer: Self-pay

## 2020-03-28 ENCOUNTER — Ambulatory Visit: Payer: No Typology Code available for payment source

## 2020-03-28 ENCOUNTER — Inpatient Hospital Stay: Payer: No Typology Code available for payment source

## 2020-03-28 VITALS — BP 140/84 | HR 72 | Resp 18

## 2020-03-28 DIAGNOSIS — Z5111 Encounter for antineoplastic chemotherapy: Secondary | ICD-10-CM | POA: Diagnosis not present

## 2020-03-28 DIAGNOSIS — C221 Intrahepatic bile duct carcinoma: Secondary | ICD-10-CM

## 2020-03-28 LAB — CANCER ANTIGEN 19-9: CA 19-9: 69 U/mL — ABNORMAL HIGH (ref 0–35)

## 2020-03-28 MED ORDER — PEGFILGRASTIM-JMDB 6 MG/0.6ML ~~LOC~~ SOSY
6.0000 mg | PREFILLED_SYRINGE | Freq: Once | SUBCUTANEOUS | Status: AC
  Administered 2020-03-28: 6 mg via SUBCUTANEOUS

## 2020-03-28 MED ORDER — PEGFILGRASTIM-JMDB 6 MG/0.6ML ~~LOC~~ SOSY
PREFILLED_SYRINGE | SUBCUTANEOUS | Status: AC
  Filled 2020-03-28: qty 0.6

## 2020-03-28 NOTE — Telephone Encounter (Signed)
Received request from pharmacy for PA on Dexcom transmitters.   PA submitted.   Dx: E11.65- DM, uncontrolled/ C25.9- pancreatic cancer  [employer] is reviewing your PA request. You may close this dialog, return to your dashboard, and perform other tasks.  To check for an update later, open this request again from your dashboard. If [employer] has not replied within 24 hours for urgent requests or within 48 hours for standard requests, please contact [employer] at [phone].

## 2020-03-29 ENCOUNTER — Other Ambulatory Visit: Payer: Self-pay | Admitting: *Deleted

## 2020-03-31 ENCOUNTER — Ambulatory Visit (HOSPITAL_COMMUNITY)
Admission: RE | Admit: 2020-03-31 | Discharge: 2020-03-31 | Disposition: A | Discharge status: Home / Self Care | Payer: No Typology Code available for payment source | Source: Ambulatory Visit | Attending: Oncology | Admitting: Oncology

## 2020-03-31 ENCOUNTER — Other Ambulatory Visit: Payer: Self-pay

## 2020-03-31 DIAGNOSIS — C221 Intrahepatic bile duct carcinoma: Secondary | ICD-10-CM | POA: Insufficient documentation

## 2020-03-31 MED ORDER — IOHEXOL 300 MG/ML  SOLN
100.0000 mL | Freq: Once | INTRAMUSCULAR | Status: AC | PRN
  Administered 2020-03-31: 100 mL via INTRAVENOUS

## 2020-04-05 ENCOUNTER — Telehealth: Payer: Self-pay

## 2020-04-05 MED FILL — DEXCOM G6 SENSOR MISC: 30 days supply | Qty: 3 | Fill #0

## 2020-04-05 MED FILL — DEXCOM G6 TRANSMITTER MISC: 90 days supply | Qty: 1 | Fill #0

## 2020-04-05 NOTE — Telephone Encounter (Signed)
-----   Message from Ladell Pier, MD sent at 04/04/2020  8:42 PM EST ----- Please call patient, liver lesions and lymph nodes are smaller, f/u as scheduled, we will continue the current therapy

## 2020-04-05 NOTE — Telephone Encounter (Signed)
Called  Left message for pt to call and receive most recent CT results (see below)  liver lesions and lymph nodes are smaller, f/u as scheduled, we will continue the current therapy  Currently awaiting return call

## 2020-04-09 ENCOUNTER — Other Ambulatory Visit: Payer: Self-pay | Admitting: Oncology

## 2020-04-10 ENCOUNTER — Inpatient Hospital Stay: Payer: No Typology Code available for payment source

## 2020-04-10 ENCOUNTER — Inpatient Hospital Stay (HOSPITAL_BASED_OUTPATIENT_CLINIC_OR_DEPARTMENT_OTHER): Payer: No Typology Code available for payment source | Admitting: Oncology

## 2020-04-10 ENCOUNTER — Inpatient Hospital Stay: Payer: No Typology Code available for payment source | Attending: Oncology

## 2020-04-10 ENCOUNTER — Other Ambulatory Visit: Payer: Self-pay

## 2020-04-10 VITALS — BP 125/86 | HR 65 | Temp 97.7°F | Resp 17 | Ht 74.0 in | Wt 233.6 lb

## 2020-04-10 DIAGNOSIS — I10 Essential (primary) hypertension: Secondary | ICD-10-CM | POA: Diagnosis not present

## 2020-04-10 DIAGNOSIS — C221 Intrahepatic bile duct carcinoma: Secondary | ICD-10-CM | POA: Diagnosis present

## 2020-04-10 DIAGNOSIS — Z5189 Encounter for other specified aftercare: Secondary | ICD-10-CM | POA: Insufficient documentation

## 2020-04-10 DIAGNOSIS — Z95828 Presence of other vascular implants and grafts: Secondary | ICD-10-CM | POA: Diagnosis not present

## 2020-04-10 DIAGNOSIS — Z5111 Encounter for antineoplastic chemotherapy: Secondary | ICD-10-CM | POA: Insufficient documentation

## 2020-04-10 DIAGNOSIS — E1142 Type 2 diabetes mellitus with diabetic polyneuropathy: Secondary | ICD-10-CM | POA: Insufficient documentation

## 2020-04-10 DIAGNOSIS — Z79899 Other long term (current) drug therapy: Secondary | ICD-10-CM | POA: Diagnosis not present

## 2020-04-10 DIAGNOSIS — C787 Secondary malignant neoplasm of liver and intrahepatic bile duct: Secondary | ICD-10-CM | POA: Diagnosis present

## 2020-04-10 LAB — CMP (CANCER CENTER ONLY)
ALT: 24 U/L (ref 0–44)
AST: 31 U/L (ref 15–41)
Albumin: 4 g/dL (ref 3.5–5.0)
Alkaline Phosphatase: 188 U/L — ABNORMAL HIGH (ref 38–126)
Anion gap: 6 (ref 5–15)
BUN: 11 mg/dL (ref 6–20)
CO2: 27 mmol/L (ref 22–32)
Calcium: 9.7 mg/dL (ref 8.9–10.3)
Chloride: 104 mmol/L (ref 98–111)
Creatinine: 0.83 mg/dL (ref 0.61–1.24)
GFR, Estimated: >60 mL/min (ref >60)
Glucose, Bld: 142 mg/dL — ABNORMAL HIGH (ref 70–99)
Potassium: 4.2 mmol/L (ref 3.5–5.1)
Sodium: 137 mmol/L (ref 135–145)
Total Bilirubin: 0.5 mg/dL (ref 0.3–1.2)
Total Protein: 7.7 g/dL (ref 6.5–8.1)

## 2020-04-10 LAB — CBC WITH DIFFERENTIAL (CANCER CENTER ONLY)
Abs Immature Granulocytes: 0.02 10*3/uL (ref 0.00–0.07)
Basophils Absolute: 0.1 10*3/uL (ref 0.0–0.1)
Basophils Relative: 1 %
Eosinophils Absolute: 0.5 10*3/uL (ref 0.0–0.5)
Eosinophils Relative: 6 %
HCT: 35.1 % — ABNORMAL LOW (ref 39.0–52.0)
Hemoglobin: 11.9 g/dL — ABNORMAL LOW (ref 13.0–17.0)
Immature Granulocytes: 0 %
Lymphocytes Relative: 12 %
Lymphs Abs: 1.1 10*3/uL (ref 0.7–4.0)
MCH: 31.5 pg (ref 26.0–34.0)
MCHC: 33.9 g/dL (ref 30.0–36.0)
MCV: 92.9 fL (ref 80.0–100.0)
Monocytes Absolute: 0.8 10*3/uL (ref 0.1–1.0)
Monocytes Relative: 8 %
Neutro Abs: 6.8 10*3/uL (ref 1.7–7.7)
Neutrophils Relative %: 73 %
Platelet Count: 251 10*3/uL (ref 150–400)
RBC: 3.78 MIL/uL — ABNORMAL LOW (ref 4.22–5.81)
RDW: 17.8 % — ABNORMAL HIGH (ref 11.5–15.5)
WBC Count: 9.4 10*3/uL (ref 4.0–10.5)
nRBC: 0 % (ref 0.0–0.2)

## 2020-04-10 LAB — MAGNESIUM: Magnesium: 2 mg/dL (ref 1.7–2.4)

## 2020-04-10 MED ORDER — FAMOTIDINE IN NACL 20-0.9 MG/50ML-% IV SOLN
20.0000 mg | Freq: Once | INTRAVENOUS | Status: AC
  Administered 2020-04-10: 20 mg via INTRAVENOUS

## 2020-04-10 MED ORDER — MAGNESIUM SULFATE 2 GM/50ML IV SOLN
INTRAVENOUS | Status: AC
  Filled 2020-04-10: qty 50

## 2020-04-10 MED ORDER — DEXAMETHASONE SODIUM PHOSPHATE 10 MG/ML IJ SOLN
INTRAMUSCULAR | Status: AC
  Filled 2020-04-10: qty 1

## 2020-04-10 MED ORDER — DEXAMETHASONE SODIUM PHOSPHATE 10 MG/ML IJ SOLN
5.0000 mg | Freq: Once | INTRAMUSCULAR | Status: AC
  Administered 2020-04-10: 5 mg via INTRAVENOUS

## 2020-04-10 MED ORDER — PALONOSETRON HCL INJECTION 0.25 MG/5ML
INTRAVENOUS | Status: AC
  Filled 2020-04-10: qty 5

## 2020-04-10 MED ORDER — SODIUM CHLORIDE 0.9% FLUSH
10.0000 mL | INTRAVENOUS | Status: DC | PRN
  Administered 2020-04-10 (×2): 10 mL
  Filled 2020-04-10: qty 10

## 2020-04-10 MED ORDER — MAGNESIUM SULFATE 2 GM/50ML IV SOLN
2.0000 g | Freq: Once | INTRAVENOUS | Status: AC
  Administered 2020-04-10: 2 g via INTRAVENOUS

## 2020-04-10 MED ORDER — POTASSIUM CHLORIDE IN NACL 20-0.9 MEQ/L-% IV SOLN
Freq: Once | INTRAVENOUS | Status: AC
Start: 2020-04-10 — End: 2020-04-10
  Filled 2020-04-10: qty 1000

## 2020-04-10 MED ORDER — SODIUM CHLORIDE 0.9 % IV SOLN
150.0000 mg | Freq: Once | INTRAVENOUS | Status: AC
  Administered 2020-04-10: 150 mg via INTRAVENOUS
  Filled 2020-04-10: qty 150

## 2020-04-10 MED ORDER — SODIUM CHLORIDE 0.9 % IV SOLN
25.0000 mg/m2 | Freq: Once | INTRAVENOUS | Status: AC
  Administered 2020-04-10: 56 mg via INTRAVENOUS
  Filled 2020-04-10: qty 56

## 2020-04-10 MED ORDER — SODIUM CHLORIDE 0.9 % IV SOLN
1000.0000 mg/m2 | Freq: Once | INTRAVENOUS | Status: AC
  Administered 2020-04-10: 2242 mg via INTRAVENOUS
  Filled 2020-04-10: qty 58.97

## 2020-04-10 MED ORDER — SODIUM CHLORIDE 0.9 % IV SOLN
Freq: Once | INTRAVENOUS | Status: AC
Start: 2020-04-10 — End: 2020-04-10
  Filled 2020-04-10: qty 250

## 2020-04-10 MED ORDER — FAMOTIDINE IN NACL 20-0.9 MG/50ML-% IV SOLN
INTRAVENOUS | Status: AC
  Filled 2020-04-10: qty 50

## 2020-04-10 MED ORDER — SODIUM CHLORIDE 0.9% FLUSH
10.0000 mL | INTRAVENOUS | Status: DC | PRN
  Administered 2020-04-10: 10 mL via INTRAVENOUS
  Filled 2020-04-10: qty 10

## 2020-04-10 MED ORDER — HEPARIN SOD (PORK) LOCK FLUSH 100 UNIT/ML IV SOLN
500.0000 [IU] | Freq: Once | INTRAVENOUS | Status: AC | PRN
  Administered 2020-04-10: 500 [IU]
  Filled 2020-04-10: qty 5

## 2020-04-10 MED ORDER — PALONOSETRON HCL INJECTION 0.25 MG/5ML
0.2500 mg | Freq: Once | INTRAVENOUS | Status: AC
  Administered 2020-04-10: 0.25 mg via INTRAVENOUS

## 2020-04-10 MED ORDER — SODIUM CHLORIDE 0.9 % IV SOLN
Freq: Once | INTRAVENOUS | Status: AC
  Filled 2020-04-10: qty 250

## 2020-04-10 NOTE — Patient Instructions (Signed)
[facility] Discharge Instructions for Patients Receiving Chemotherapy  Today you received the following chemotherapy agents Gemcitabine (GEMZAR) & Cisplatin (PLATINOL).  To help prevent nausea and vomiting after your treatment, we encourage you to take your nausea medication as prescribed.  If you develop nausea and vomiting that is not controlled by your nausea medication, call the clinic.   BELOW ARE SYMPTOMS THAT SHOULD BE REPORTED IMMEDIATELY:  *FEVER GREATER THAN 100.5 F  *CHILLS WITH OR WITHOUT FEVER  NAUSEA AND VOMITING THAT IS NOT CONTROLLED WITH YOUR NAUSEA MEDICATION  *UNUSUAL SHORTNESS OF BREATH  *UNUSUAL BRUISING OR BLEEDING  TENDERNESS IN MOUTH AND THROAT WITH OR WITHOUT PRESENCE OF ULCERS  *URINARY PROBLEMS  *BOWEL PROBLEMS  UNUSUAL RASH Items with * indicate a potential emergency and should be followed up as soon as possible.  Feel free to call the clinic should you have any questions or concerns. The clinic phone number is [phone].  Please show the CHEMO ALERT CARD at check-in to the Emergency Department and triage nurse.   

## 2020-04-10 NOTE — Progress Notes (Signed)
[facility] OFFICE PROGRESS NOTE   Diagnosis: Cholangiocarcinoma  INTERVAL HISTORY:   Matthew Osborne completed another cycle of gemcitabine/cisplatin beginning 03/20/2020. He had mild nausea following chemotherapy. Bone pain lasted for 1 day following G-CSF. No neuropathy symptoms. He has mild discomfort in the left upper abdomen. No peripheral neuropathy symptoms.  Objective:  Vital signs in last 24 hours:  Blood pressure 125/86, pulse 65, temperature 97.7 F (36.5 C), temperature source Tympanic, resp. rate 17, height 6' 2"  (1.88 m), weight 233 lb 9.6 oz (106 kg), SpO2 98 %.    HEENT: No thrush or ulcers Resp: Lungs clear bilaterally Cardio: Regular rate and rhythm GI: No hepatosplenomegaly, nontender Vascular: No leg edema    Portacath/PICC-without erythema  Lab Results:  Lab Results  Component Value Date   WBC 9.4 04/10/2020   HGB 11.9 (L) 04/10/2020   HCT 35.1 (L) 04/10/2020   MCV 92.9 04/10/2020   PLT 251 04/10/2020   NEUTROABS 6.8 04/10/2020    CMP  Lab Results  Component Value Date   NA 135 03/27/2020   K 4.5 03/27/2020   CL 100 03/27/2020   CO2 28 03/27/2020   GLUCOSE 212 (H) 03/27/2020   BUN 13 03/27/2020   CREATININE 0.85 03/27/2020   CALCIUM 9.6 03/27/2020   PROT 7.8 03/27/2020   ALBUMIN 4.0 03/27/2020   AST 32 03/27/2020   ALT 36 03/27/2020   ALKPHOS 172 (H) 03/27/2020   BILITOT 0.5 03/27/2020   GFRNONAA >60 03/27/2020   GFRAA 127 01/11/2020    Lab Results  Component Value Date   CEA1 2.15 01/14/2020    Medications: I have reviewed the patient's current medications.   Assessment/Plan: 1. Cholangiocarcinoma  multiple liver masses and abdominal lymphadenopathy ? CTs 01/13/2020-rounded hypodense mass appears to arise from the pancreas neck, multiple rim-enhancing masses in the liver, primarily left liver with segmental dilation of the left lobe Intermatic bile ducts, ill-defined hypodensity the central liver with effacement of  the left portal vein, enlarged portacaval and retroperitoneal lymph nodes ? Ultrasound-guided biopsy of the left liver lesion 01/19/2020-adenocarcinoma, cytokeratin 7+, MSS, tumor mutation burden 1, IDH1 R132C ? MRI abdomen 01/31/2020-poorly marginated central liver mass, multiple smaller similar satellite liver masses, extrinsic mass-effect at the biliary hilum with intrahepatic biliary ductal dilatation throughout the left liver with mild Intermatic dilatation the superior right liver, no pancreas mass or ductal dilatation, normal spleen size, numerous enlarged enhancing lymph nodes at the porta hepatis, peripancreatic, portacaval, aortocaval, left periaortic chains ? Cycle 1 gemcitabine/cisplatin 02/04/2020 ? Cycle 2 gemcitabine/cisplatin 02/28/2020 ? Cycle 3 gemcitabine/cisplatin 03/20/2020 ? CT abdomen/pelvis 03/31/2020-mild decrease in central liver tumor, mild decrease in size of liver metastases and upper abdominal adenopathy, new splenomegaly ? Cycle 4 gemcitabine/cisplatin 04/10/2020  2. Cough-likely related to diaphragmatic irritation from #1 3. Anorexia/weight loss 4. Hypercalcemia-likely hypercalcemia malignancy, status post intravenous hydration and Zometa 01/14/2020 5. Diabetes 6. Hyperlipidemia 7. Hypertension 8. History of peripheral neuropathy secondary to diabetes 9. Severe back pain following Fulphila-oxycodone prescribed  Disposition: Matthew Osborne appears stable. He has completed 3 cycles of gemcitabine/cisplatin. He has tolerated the chemotherapy well. The restaging CT is consistent with a response to the gemcitabine/cisplatin. His clinical status has improved while on chemotherapy.  I reviewed the restaging CT images with Matthew Osborne and his wife. We discussed treatment options. He will complete another cycle of gemcitabine/cisplatin beginning today. We discussed recent data supporting the addition of durvalumab to the gemcitabine/cisplatin regimen. We will check into  availability of durvalumab via the  Cancer center pharmacy.  Matthew Osborne will return for an office visit in the next cycle of chemotherapy on 05/01/2020.  Betsy Coder, MD  04/10/2020  9:20 AM

## 2020-04-11 ENCOUNTER — Telehealth: Payer: Self-pay | Admitting: Oncology

## 2020-04-11 NOTE — Telephone Encounter (Signed)
Scheduled appointments per 3/7 los. Called patient, no answer. Left message with appointments dates and times.

## 2020-04-14 MED FILL — Fosaprepitant Dimeglumine For IV Infusion 150 MG (Base Eq): INTRAVENOUS | Qty: 5 | Status: AC

## 2020-04-15 MED FILL — UNIFINE PENTIPS 31GX3/16: 31G X 5 MM | 25 days supply | Qty: 100 | Fill #2

## 2020-04-15 MED FILL — HUMALOG 100 UNITS/ML KWIKPE: 100 | 30 days supply | Qty: 12 | Fill #1

## 2020-04-17 ENCOUNTER — Inpatient Hospital Stay: Payer: No Typology Code available for payment source

## 2020-04-17 ENCOUNTER — Encounter: Payer: Self-pay | Admitting: General Practice

## 2020-04-17 ENCOUNTER — Other Ambulatory Visit: Payer: Self-pay

## 2020-04-17 VITALS — BP 154/98 | HR 61 | Temp 98.2°F | Resp 18 | Wt 231.8 lb

## 2020-04-17 DIAGNOSIS — Z5111 Encounter for antineoplastic chemotherapy: Secondary | ICD-10-CM | POA: Diagnosis not present

## 2020-04-17 DIAGNOSIS — C221 Intrahepatic bile duct carcinoma: Secondary | ICD-10-CM

## 2020-04-17 LAB — CBC WITH DIFFERENTIAL (CANCER CENTER ONLY)
Abs Immature Granulocytes: 0.01 10*3/uL (ref 0.00–0.07)
Basophils Absolute: 0.1 10*3/uL (ref 0.0–0.1)
Basophils Relative: 3 %
Eosinophils Absolute: 0.3 10*3/uL (ref 0.0–0.5)
Eosinophils Relative: 7 %
HCT: 35.7 % — ABNORMAL LOW (ref 39.0–52.0)
Hemoglobin: 11.8 g/dL — ABNORMAL LOW (ref 13.0–17.0)
Immature Granulocytes: 0 %
Lymphocytes Relative: 38 %
Lymphs Abs: 1.4 10*3/uL (ref 0.7–4.0)
MCH: 31.8 pg (ref 26.0–34.0)
MCHC: 33.1 g/dL (ref 30.0–36.0)
MCV: 96.2 fL (ref 80.0–100.0)
Monocytes Absolute: 0.5 10*3/uL (ref 0.1–1.0)
Monocytes Relative: 13 %
Neutro Abs: 1.4 10*3/uL — ABNORMAL LOW (ref 1.7–7.7)
Neutrophils Relative %: 39 %
Platelet Count: 245 10*3/uL (ref 150–400)
RBC: 3.71 MIL/uL — ABNORMAL LOW (ref 4.22–5.81)
RDW: 17.2 % — ABNORMAL HIGH (ref 11.5–15.5)
WBC Count: 3.7 10*3/uL — ABNORMAL LOW (ref 4.0–10.5)
nRBC: 0 % (ref 0.0–0.2)

## 2020-04-17 LAB — CMP (CANCER CENTER ONLY)
ALT: 41 U/L (ref 0–44)
AST: 41 U/L (ref 15–41)
Albumin: 4.1 g/dL (ref 3.5–5.0)
Alkaline Phosphatase: 153 U/L — ABNORMAL HIGH (ref 38–126)
Anion gap: 9 (ref 5–15)
BUN: 15 mg/dL (ref 6–20)
CO2: 27 mmol/L (ref 22–32)
Calcium: 9.9 mg/dL (ref 8.9–10.3)
Chloride: 102 mmol/L (ref 98–111)
Creatinine: 0.84 mg/dL (ref 0.61–1.24)
GFR, Estimated: >60 mL/min (ref >60)
Glucose, Bld: 117 mg/dL — ABNORMAL HIGH (ref 70–99)
Potassium: 4.4 mmol/L (ref 3.5–5.1)
Sodium: 138 mmol/L (ref 135–145)
Total Bilirubin: 0.6 mg/dL (ref 0.3–1.2)
Total Protein: 7.8 g/dL (ref 6.5–8.1)

## 2020-04-17 LAB — MAGNESIUM: Magnesium: 2 mg/dL (ref 1.7–2.4)

## 2020-04-17 MED ORDER — SODIUM CHLORIDE 0.9% FLUSH
10.0000 mL | INTRAVENOUS | Status: DC | PRN
  Administered 2020-04-17: 10 mL
  Filled 2020-04-17: qty 10

## 2020-04-17 MED ORDER — HEPARIN SOD (PORK) LOCK FLUSH 100 UNIT/ML IV SOLN
500.0000 [IU] | Freq: Once | INTRAVENOUS | Status: AC | PRN
  Administered 2020-04-17: 500 [IU]
  Filled 2020-04-17: qty 5

## 2020-04-17 MED ORDER — POTASSIUM CHLORIDE IN NACL 20-0.9 MEQ/L-% IV SOLN
Freq: Once | INTRAVENOUS | Status: AC
  Filled 2020-04-17: qty 1000

## 2020-04-17 MED ORDER — MAGNESIUM SULFATE 2 GM/50ML IV SOLN
2.0000 g | Freq: Once | INTRAVENOUS | Status: AC
  Administered 2020-04-17: 2 g via INTRAVENOUS

## 2020-04-17 MED ORDER — FAMOTIDINE IN NACL 20-0.9 MG/50ML-% IV SOLN: 20.0000 mg | Freq: Once | INTRAVENOUS | Status: DC

## 2020-04-17 MED ORDER — PALONOSETRON HCL INJECTION 0.25 MG/5ML
INTRAVENOUS | Status: AC
  Filled 2020-04-17: qty 5

## 2020-04-17 MED ORDER — SODIUM CHLORIDE 0.9 % IV SOLN
150.0000 mg | Freq: Once | INTRAVENOUS | Status: DC
  Filled 2020-04-17: qty 5

## 2020-04-17 MED ORDER — DEXAMETHASONE SODIUM PHOSPHATE 10 MG/ML IJ SOLN: 5.0000 mg | Freq: Once | INTRAMUSCULAR | Status: DC

## 2020-04-17 MED ORDER — PALONOSETRON HCL INJECTION 0.25 MG/5ML
0.2500 mg | Freq: Once | INTRAVENOUS | Status: AC
  Administered 2020-04-17: 0.25 mg via INTRAVENOUS

## 2020-04-17 MED ORDER — SODIUM CHLORIDE 0.9 % IV SOLN
Freq: Once | INTRAVENOUS | Status: AC
  Filled 2020-04-17: qty 250

## 2020-04-17 MED ORDER — SODIUM CHLORIDE 0.9 % IV SOLN
150.0000 mg | Freq: Once | INTRAVENOUS | Status: AC
  Administered 2020-04-17: 150 mg via INTRAVENOUS
  Filled 2020-04-17: qty 150

## 2020-04-17 MED ORDER — CISPLATIN CHEMO INJECTION 100MG/100ML
25.0000 mg/m2 | Freq: Once | INTRAVENOUS | Status: AC
  Administered 2020-04-17: 56 mg via INTRAVENOUS
  Filled 2020-04-17: qty 56

## 2020-04-17 MED ORDER — PALONOSETRON HCL INJECTION 0.25 MG/5ML: 0.2500 mg | Freq: Once | INTRAVENOUS | Status: DC

## 2020-04-17 MED ORDER — MAGNESIUM SULFATE 2 GM/50ML IV SOLN
INTRAVENOUS | Status: AC
  Filled 2020-04-17: qty 50

## 2020-04-17 MED ORDER — SODIUM CHLORIDE 0.9 % IV SOLN
1000.0000 mg/m2 | Freq: Once | INTRAVENOUS | Status: AC
  Administered 2020-04-17: 2242 mg via INTRAVENOUS
  Filled 2020-04-17: qty 58.97

## 2020-04-17 MED ORDER — DEXAMETHASONE SODIUM PHOSPHATE 10 MG/ML IJ SOLN
INTRAMUSCULAR | Status: AC
  Filled 2020-04-17: qty 1

## 2020-04-17 MED ORDER — FAMOTIDINE IN NACL 20-0.9 MG/50ML-% IV SOLN
20.0000 mg | Freq: Once | INTRAVENOUS | Status: AC
  Administered 2020-04-17: 20 mg via INTRAVENOUS

## 2020-04-17 MED ORDER — DEXAMETHASONE SODIUM PHOSPHATE 10 MG/ML IJ SOLN
5.0000 mg | Freq: Once | INTRAMUSCULAR | Status: AC
  Administered 2020-04-17: 5 mg via INTRAVENOUS

## 2020-04-17 MED ORDER — FAMOTIDINE IN NACL 20-0.9 MG/50ML-% IV SOLN
INTRAVENOUS | Status: AC
  Filled 2020-04-17: qty 50

## 2020-04-17 NOTE — Progress Notes (Signed)
Per Dr. Benay Spice it is ok to treat today with ANC 1.4

## 2020-04-17 NOTE — Patient Instructions (Addendum)
[facility] Discharge Instructions for Patients Receiving Chemotherapy  Today you received the following chemotherapy agents Gemcitabine and Cisplatin.  To help prevent nausea and vomiting after your treatment, we encourage you to take your nausea medication as directed.   If you develop nausea and vomiting that is not controlled by your nausea medication, call the clinic.   BELOW ARE SYMPTOMS THAT SHOULD BE REPORTED IMMEDIATELY:  *FEVER GREATER THAN 100.5 F  *CHILLS WITH OR WITHOUT FEVER  NAUSEA AND VOMITING THAT IS NOT CONTROLLED WITH YOUR NAUSEA MEDICATION  *UNUSUAL SHORTNESS OF BREATH  *UNUSUAL BRUISING OR BLEEDING  TENDERNESS IN MOUTH AND THROAT WITH OR WITHOUT PRESENCE OF ULCERS  *URINARY PROBLEMS  *BOWEL PROBLEMS  UNUSUAL RASH Items with * indicate a potential emergency and should be followed up as soon as possible.  Feel free to call the clinic should you have any questions or concerns. The clinic phone number is [phone].  Please show the [place] at check-in to the Emergency Department and triage nurse.

## 2020-04-17 NOTE — Progress Notes (Signed)
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 He reports doing well physically overall (except for feeling poorly right after chemo), but is struggling with missing his normal activity level (including sometimes two jobs). Provided empathic listening, normalization of feelings, and witness to his stories. We plan to follow up at his next treatment.   [place], [place], [facility] Pager [phone] Voicemail [phone]

## 2020-04-17 NOTE — Patient Instructions (Signed)
Implanted Port Insertion, Care After This sheet gives you information about how to care for yourself after your procedure. Your health care provider may also give you more specific instructions. If you have problems or questions, contact your health care provider. What can I expect after the procedure? After the procedure, it is common to have:  Discomfort at the port insertion site.  Bruising on the skin over the port. This should improve over 3-4 days. Follow these instructions at home: Port care  After your port is placed, you will get a manufacturer's information card. The card has information about your port. Keep this card with you at all times.  Take care of the port as told by your health care provider. Ask your health care provider if you or a family member can get training for taking care of the port at home. A home health care nurse may also take care of the port.  Make sure to remember what type of port you have. Incision care  Follow instructions from your health care provider about how to take care of your port insertion site. Make sure you: ? Wash your hands with soap and water before and after you change your bandage (dressing). If soap and water are not available, use hand sanitizer. ? Change your dressing as told by your health care provider. ? Leave stitches (sutures), skin glue, or adhesive strips in place. These skin closures may need to stay in place for 2 weeks or longer. If adhesive strip edges start to loosen and curl up, you may trim the loose edges. Do not remove adhesive strips completely unless your health care provider tells you to do that.  Check your port insertion site every day for signs of infection. Check for: ? Redness, swelling, or pain. ? Fluid or blood. ? Warmth. ? Pus or a bad smell.      Activity  Return to your normal activities as told by your health care provider. Ask your health care provider what activities are safe for you.  Do not  lift anything that is heavier than 10 lb (4.5 kg), or the limit that you are told, until your health care provider says that it is safe. General instructions  Take over-the-counter and prescription medicines only as told by your health care provider.  Do not take baths, swim, or use a hot tub until your health care provider approves. Ask your health care provider if you may take showers. You may only be allowed to take sponge baths.  Do not drive for 24 hours if you were given a sedative during your procedure.  Wear a medical alert bracelet in case of an emergency. This will tell any health care providers that you have a port.  Keep all follow-up visits as told by your health care provider. This is important. Contact a health care provider if:  You cannot flush your port with saline as directed, or you cannot draw blood from the port.  You have a fever or chills.  You have redness, swelling, or pain around your port insertion site.  You have fluid or blood coming from your port insertion site.  Your port insertion site feels warm to the touch.  You have pus or a bad smell coming from the port insertion site. Get help right away if:  You have chest pain or shortness of breath.  You have bleeding from your port that you cannot control. Summary  Take care of the port as told by your   health care provider. Keep the manufacturer's information card with you at all times.  Change your dressing as told by your health care provider.  Contact a health care provider if you have a fever or chills or if you have redness, swelling, or pain around your port insertion site.  Keep all follow-up visits as told by your health care provider. This information is not intended to replace advice given to you by your health care provider. Make sure you discuss any questions you have with your health care provider. Document Revised: 08/19/2017 Document Reviewed: 08/19/2017 Elsevier Patient Education   2021 [employer] Inc.  

## 2020-04-18 ENCOUNTER — Inpatient Hospital Stay: Payer: No Typology Code available for payment source

## 2020-04-18 VITALS — BP 128/73 | HR 78 | Resp 18

## 2020-04-18 DIAGNOSIS — Z5111 Encounter for antineoplastic chemotherapy: Secondary | ICD-10-CM | POA: Diagnosis not present

## 2020-04-18 DIAGNOSIS — C221 Intrahepatic bile duct carcinoma: Secondary | ICD-10-CM

## 2020-04-18 MED ORDER — PEGFILGRASTIM-JMDB 6 MG/0.6ML ~~LOC~~ SOSY
6.0000 mg | PREFILLED_SYRINGE | Freq: Once | SUBCUTANEOUS | Status: AC
  Administered 2020-04-18: 6 mg via SUBCUTANEOUS

## 2020-04-18 MED ORDER — PEGFILGRASTIM-JMDB 6 MG/0.6ML ~~LOC~~ SOSY
PREFILLED_SYRINGE | SUBCUTANEOUS | Status: AC
  Filled 2020-04-18: qty 0.6

## 2020-04-18 NOTE — Patient Instructions (Signed)
Pegfilgrastim injection What is this medicine? PEGFILGRASTIM (PEG fil gra stim) is a long-acting granulocyte colony-stimulating factor that stimulates the growth of neutrophils, a type of white blood cell important in the body's fight against infection. It is used to reduce the incidence of fever and infection in patients with certain types of cancer who are receiving chemotherapy that affects the bone marrow, and to increase survival after being exposed to high doses of radiation. This medicine may be used for other purposes; ask your health care provider or pharmacist if you have questions. COMMON BRAND NAME(S): Fulphila, Neulasta, Nyvepria, UDENYCA, Ziextenzo What should I tell my health care provider before I take this medicine? They need to know if you have any of these conditions:  kidney disease  latex allergy  ongoing radiation therapy  sickle cell disease  skin reactions to acrylic adhesives (On-Body Injector only)  an unusual or allergic reaction to pegfilgrastim, filgrastim, other medicines, foods, dyes, or preservatives  pregnant or trying to get pregnant  breast-feeding How should I use this medicine? This medicine is for injection under the skin. If you get this medicine at home, you will be taught how to prepare and give the pre-filled syringe or how to use the On-body Injector. Refer to the patient Instructions for Use for detailed instructions. Use exactly as directed. Tell your healthcare provider immediately if you suspect that the On-body Injector may not have performed as intended or if you suspect the use of the On-body Injector resulted in a missed or partial dose. It is important that you put your used needles and syringes in a special sharps container. Do not put them in a trash can. If you do not have a sharps container, call your pharmacist or healthcare provider to get one. Talk to your pediatrician regarding the use of this medicine in children. While this drug  may be prescribed for selected conditions, precautions do apply. Overdosage: If you think you have taken too much of this medicine contact a poison control center or emergency room at once. NOTE: This medicine is only for you. Do not share this medicine with others. What if I miss a dose? It is important not to miss your dose. Call your doctor or health care professional if you miss your dose. If you miss a dose due to an On-body Injector failure or leakage, a new dose should be administered as soon as possible using a single prefilled syringe for manual use. What may interact with this medicine? Interactions have not been studied. This list may not describe all possible interactions. Give your health care provider a list of all the medicines, herbs, non-prescription drugs, or dietary supplements you use. Also tell them if you smoke, drink alcohol, or use illegal drugs. Some items may interact with your medicine. What should I watch for while using this medicine? Your condition will be monitored carefully while you are receiving this medicine. You may need blood work done while you are taking this medicine. Talk to your health care provider about your risk of cancer. You may be more at risk for certain types of cancer if you take this medicine. If you are going to need a MRI, CT scan, or other procedure, tell your doctor that you are using this medicine (On-Body Injector only). What side effects may I notice from receiving this medicine? Side effects that you should report to your doctor or health care professional as soon as possible:  allergic reactions (skin rash, itching or hives, swelling of   the face, lips, or tongue)  back pain  dizziness  fever  pain, redness, or irritation at site where injected  pinpoint red spots on the skin  red or dark-brown urine  shortness of breath or breathing problems  stomach or side pain, or pain at the shoulder  swelling  tiredness  trouble  passing urine or change in the amount of urine  unusual bruising or bleeding Side effects that usually do not require medical attention (report to your doctor or health care professional if they continue or are bothersome):  bone pain  muscle pain This list may not describe all possible side effects. Call your doctor for medical advice about side effects. You may report side effects to FDA at 1-800-FDA-1088. Where should I keep my medicine? Keep out of the reach of children. If you are using this medicine at home, you will be instructed on how to store it. Throw away any unused medicine after the expiration date on the label. NOTE: This sheet is a summary. It may not cover all possible information. If you have questions about this medicine, talk to your doctor, pharmacist, or health care provider.  2021 Elsevier/Gold Standard (2019-02-12 13:20:51)  

## 2020-04-27 ENCOUNTER — Other Ambulatory Visit: Payer: Self-pay | Admitting: Oncology

## 2020-05-01 ENCOUNTER — Encounter: Payer: Self-pay | Admitting: Nurse Practitioner

## 2020-05-01 ENCOUNTER — Inpatient Hospital Stay: Payer: No Typology Code available for payment source

## 2020-05-01 ENCOUNTER — Inpatient Hospital Stay (HOSPITAL_BASED_OUTPATIENT_CLINIC_OR_DEPARTMENT_OTHER): Payer: No Typology Code available for payment source | Admitting: Nurse Practitioner

## 2020-05-01 ENCOUNTER — Other Ambulatory Visit: Payer: Self-pay

## 2020-05-01 ENCOUNTER — Other Ambulatory Visit: Payer: Self-pay | Admitting: Nurse Practitioner

## 2020-05-01 ENCOUNTER — Encounter: Payer: Self-pay | Admitting: General Practice

## 2020-05-01 VITALS — BP 130/86 | HR 63 | Temp 97.9°F | Resp 14 | Ht 74.0 in | Wt 235.4 lb

## 2020-05-01 DIAGNOSIS — C221 Intrahepatic bile duct carcinoma: Secondary | ICD-10-CM

## 2020-05-01 DIAGNOSIS — Z5111 Encounter for antineoplastic chemotherapy: Secondary | ICD-10-CM | POA: Diagnosis not present

## 2020-05-01 DIAGNOSIS — Z95828 Presence of other vascular implants and grafts: Secondary | ICD-10-CM

## 2020-05-01 LAB — CMP (CANCER CENTER ONLY)
ALT: 29 U/L (ref 0–44)
AST: 37 U/L (ref 15–41)
Albumin: 3.9 g/dL (ref 3.5–5.0)
Alkaline Phosphatase: 184 U/L — ABNORMAL HIGH (ref 38–126)
Anion gap: 10 (ref 5–15)
BUN: 15 mg/dL (ref 6–20)
CO2: 28 mmol/L (ref 22–32)
Calcium: 9.6 mg/dL (ref 8.9–10.3)
Chloride: 103 mmol/L (ref 98–111)
Creatinine: 0.84 mg/dL (ref 0.61–1.24)
GFR, Estimated: >60 mL/min (ref >60)
Glucose, Bld: 95 mg/dL (ref 70–99)
Potassium: 4.5 mmol/L (ref 3.5–5.1)
Sodium: 141 mmol/L (ref 135–145)
Total Bilirubin: 0.5 mg/dL (ref 0.3–1.2)
Total Protein: 7.4 g/dL (ref 6.5–8.1)

## 2020-05-01 LAB — CBC WITH DIFFERENTIAL (CANCER CENTER ONLY)
Abs Immature Granulocytes: 0.03 10*3/uL (ref 0.00–0.07)
Basophils Absolute: 0.1 10*3/uL (ref 0.0–0.1)
Basophils Relative: 1 %
Eosinophils Absolute: 0.6 10*3/uL — ABNORMAL HIGH (ref 0.0–0.5)
Eosinophils Relative: 7 %
HCT: 35.5 % — ABNORMAL LOW (ref 39.0–52.0)
Hemoglobin: 12 g/dL — ABNORMAL LOW (ref 13.0–17.0)
Immature Granulocytes: 0 %
Lymphocytes Relative: 18 %
Lymphs Abs: 1.5 10*3/uL (ref 0.7–4.0)
MCH: 33.1 pg (ref 26.0–34.0)
MCHC: 33.8 g/dL (ref 30.0–36.0)
MCV: 98.1 fL (ref 80.0–100.0)
Monocytes Absolute: 0.8 10*3/uL (ref 0.1–1.0)
Monocytes Relative: 9 %
Neutro Abs: 5.4 10*3/uL (ref 1.7–7.7)
Neutrophils Relative %: 65 %
Platelet Count: 247 10*3/uL (ref 150–400)
RBC: 3.62 MIL/uL — ABNORMAL LOW (ref 4.22–5.81)
RDW: 17.6 % — ABNORMAL HIGH (ref 11.5–15.5)
WBC Count: 8.4 10*3/uL (ref 4.0–10.5)
nRBC: 0 % (ref 0.0–0.2)

## 2020-05-01 LAB — MAGNESIUM: Magnesium: 2 mg/dL (ref 1.7–2.4)

## 2020-05-01 MED ORDER — OXYCODONE HCL 5 MG PO TABS: 5.0000 mg | ORAL_TABLET | Freq: Four times a day (QID) | ORAL | 0 refills | Status: DC | PRN

## 2020-05-01 MED ORDER — SODIUM CHLORIDE 0.9% FLUSH
10.0000 mL | INTRAVENOUS | Status: DC | PRN
  Administered 2020-05-01: 10 mL via INTRAVENOUS
  Filled 2020-05-01: qty 10

## 2020-05-01 MED ORDER — SODIUM CHLORIDE 0.9 % IV SOLN
1000.0000 mg/m2 | Freq: Once | INTRAVENOUS | Status: AC
  Administered 2020-05-01: 2242 mg via INTRAVENOUS
  Filled 2020-05-01: qty 58.97

## 2020-05-01 MED ORDER — MAGNESIUM SULFATE 2 GM/50ML IV SOLN
INTRAVENOUS | Status: AC
  Filled 2020-05-01: qty 50

## 2020-05-01 MED ORDER — PALONOSETRON HCL INJECTION 0.25 MG/5ML
INTRAVENOUS | Status: AC
  Filled 2020-05-01: qty 5

## 2020-05-01 MED ORDER — SODIUM CHLORIDE 0.9 % IV SOLN: 1500.0000 mg | Freq: Once | INTRAVENOUS | Status: DC

## 2020-05-01 MED ORDER — FAMOTIDINE IN NACL 20-0.9 MG/50ML-% IV SOLN
20.0000 mg | Freq: Once | INTRAVENOUS | Status: AC
  Administered 2020-05-01: 20 mg via INTRAVENOUS

## 2020-05-01 MED ORDER — FAMOTIDINE IN NACL 20-0.9 MG/50ML-% IV SOLN
INTRAVENOUS | Status: AC
  Filled 2020-05-01: qty 50

## 2020-05-01 MED ORDER — DEXAMETHASONE SODIUM PHOSPHATE 10 MG/ML IJ SOLN
INTRAMUSCULAR | Status: AC
  Filled 2020-05-01: qty 1

## 2020-05-01 MED ORDER — TRAMADOL HCL 50 MG PO TABS: 50.0000 mg | ORAL_TABLET | Freq: Four times a day (QID) | ORAL | 0 refills | Status: DC | PRN

## 2020-05-01 MED ORDER — MAGNESIUM SULFATE 2 GM/50ML IV SOLN
2.0000 g | Freq: Once | INTRAVENOUS | Status: AC
  Administered 2020-05-01: 2 g via INTRAVENOUS

## 2020-05-01 MED ORDER — ONDANSETRON HCL 8 MG PO TABS: 8.0000 mg | ORAL_TABLET | Freq: Three times a day (TID) | ORAL | 1 refills | Status: DC | PRN

## 2020-05-01 MED ORDER — SODIUM CHLORIDE 0.9 % IV SOLN
Freq: Once | INTRAVENOUS | Status: AC
  Filled 2020-05-01: qty 250

## 2020-05-01 MED ORDER — DEXAMETHASONE SODIUM PHOSPHATE 10 MG/ML IJ SOLN
5.0000 mg | Freq: Once | INTRAMUSCULAR | Status: AC
  Administered 2020-05-01: 5 mg via INTRAVENOUS

## 2020-05-01 MED ORDER — HEPARIN SOD (PORK) LOCK FLUSH 100 UNIT/ML IV SOLN
500.0000 [IU] | Freq: Once | INTRAVENOUS | Status: AC | PRN
  Administered 2020-05-01: 500 [IU]
  Filled 2020-05-01: qty 5

## 2020-05-01 MED ORDER — SODIUM CHLORIDE 0.9 % IV SOLN
25.0000 mg/m2 | Freq: Once | INTRAVENOUS | Status: AC
  Administered 2020-05-01: 56 mg via INTRAVENOUS
  Filled 2020-05-01: qty 56

## 2020-05-01 MED ORDER — POTASSIUM CHLORIDE IN NACL 20-0.9 MEQ/L-% IV SOLN
Freq: Once | INTRAVENOUS | Status: AC
  Filled 2020-05-01: qty 1000

## 2020-05-01 MED ORDER — SODIUM CHLORIDE 0.9 % IV SOLN
150.0000 mg | Freq: Once | INTRAVENOUS | Status: AC
  Administered 2020-05-01: 150 mg via INTRAVENOUS
  Filled 2020-05-01: qty 150

## 2020-05-01 MED ORDER — PALONOSETRON HCL INJECTION 0.25 MG/5ML
0.2500 mg | Freq: Once | INTRAVENOUS | Status: AC
  Administered 2020-05-01: 0.25 mg via INTRAVENOUS

## 2020-05-01 MED ORDER — SODIUM CHLORIDE 0.9 % IV SOLN
Freq: Once | INTRAVENOUS | Status: DC
  Filled 2020-05-01: qty 250

## 2020-05-01 MED ORDER — SODIUM CHLORIDE 0.9% FLUSH
10.0000 mL | INTRAVENOUS | Status: DC | PRN
  Administered 2020-05-01: 10 mL
  Filled 2020-05-01: qty 10

## 2020-05-01 MED FILL — traMADol HCL 50 MG TABS: 50 | 7 days supply | Qty: 30 | Fill #0

## 2020-05-01 MED FILL — oxyCODONE HCL 5 MG TABS: 5 | 2 days supply | Qty: 10 | Fill #0

## 2020-05-01 MED FILL — ONDANSETRON HCL 8 MG TABLET: 8 | 10 days supply | Qty: 30 | Fill #0

## 2020-05-01 NOTE — Patient Instructions (Signed)
Implanted Port Insertion, Care After This sheet gives you information about how to care for yourself after your procedure. Your health care provider may also give you more specific instructions. If you have problems or questions, contact your health care provider. What can I expect after the procedure? After the procedure, it is common to have:  Discomfort at the port insertion site.  Bruising on the skin over the port. This should improve over 3-4 days. Follow these instructions at home: Port care  After your port is placed, you will get a manufacturer's information card. The card has information about your port. Keep this card with you at all times.  Take care of the port as told by your health care provider. Ask your health care provider if you or a family member can get training for taking care of the port at home. A home health care nurse may also take care of the port.  Make sure to remember what type of port you have. Incision care  Follow instructions from your health care provider about how to take care of your port insertion site. Make sure you: ? Wash your hands with soap and water before and after you change your bandage (dressing). If soap and water are not available, use hand sanitizer. ? Change your dressing as told by your health care provider. ? Leave stitches (sutures), skin glue, or adhesive strips in place. These skin closures may need to stay in place for 2 weeks or longer. If adhesive strip edges start to loosen and curl up, you may trim the loose edges. Do not remove adhesive strips completely unless your health care provider tells you to do that.  Check your port insertion site every day for signs of infection. Check for: ? Redness, swelling, or pain. ? Fluid or blood. ? Warmth. ? Pus or a bad smell.      Activity  Return to your normal activities as told by your health care provider. Ask your health care provider what activities are safe for you.  Do not  lift anything that is heavier than 10 lb (4.5 kg), or the limit that you are told, until your health care provider says that it is safe. General instructions  Take over-the-counter and prescription medicines only as told by your health care provider.  Do not take baths, swim, or use a hot tub until your health care provider approves. Ask your health care provider if you may take showers. You may only be allowed to take sponge baths.  Do not drive for 24 hours if you were given a sedative during your procedure.  Wear a medical alert bracelet in case of an emergency. This will tell any health care providers that you have a port.  Keep all follow-up visits as told by your health care provider. This is important. Contact a health care provider if:  You cannot flush your port with saline as directed, or you cannot draw blood from the port.  You have a fever or chills.  You have redness, swelling, or pain around your port insertion site.  You have fluid or blood coming from your port insertion site.  Your port insertion site feels warm to the touch.  You have pus or a bad smell coming from the port insertion site. Get help right away if:  You have chest pain or shortness of breath.  You have bleeding from your port that you cannot control. Summary  Take care of the port as told by your   health care provider. Keep the manufacturer's information card with you at all times.  Change your dressing as told by your health care provider.  Contact a health care provider if you have a fever or chills or if you have redness, swelling, or pain around your port insertion site.  Keep all follow-up visits as told by your health care provider. This information is not intended to replace advice given to you by your health care provider. Make sure you discuss any questions you have with your health care provider. Document Revised: 08/19/2017 Document Reviewed: 08/19/2017 Elsevier Patient Education   2021 [employer] Inc.  

## 2020-05-01 NOTE — Progress Notes (Addendum)
[facility] OFFICE PROGRESS NOTE   Diagnosis: Cholangiocarcinoma  INTERVAL HISTORY:   Matthew Osborne returns as scheduled.  He completed cycle 4 gemcitabine/cisplatin 04/10/2020.  No significant issues with nausea.  No mouth sores.  No diarrhea.  He denies fever and rash.  Main complaint is fatigue.  He reports pre-existing neuropathy symptoms in the feet/legs due to diabetes.  He feels like the tingling and burning has increased in the right leg.  He recently began having intermittent pain at the right abdomen/right back.  Objective:  Vital signs in last 24 hours:  Blood pressure 130/86, pulse 63, temperature 97.9 F (36.6 C), temperature source Tympanic, resp. rate 14, height $RemoveBe'6\' 2"'iGYniXUDd$  (1.88 m), weight 235 lb 6.4 oz (106.8 kg), SpO2 99 %.    HEENT: No thrush or ulcers. Resp: Lungs clear bilaterally. Cardio: Regular rate and rhythm. GI: No hepatomegaly. Vascular: No leg edema.  Skin: No rash. Port-A-Cath without erythema.   Lab Results:  Lab Results  Component Value Date   WBC 8.4 05/01/2020   HGB 12.0 (L) 05/01/2020   HCT 35.5 (L) 05/01/2020   MCV 98.1 05/01/2020   PLT 247 05/01/2020   NEUTROABS 5.4 05/01/2020    Imaging:  No results found.  Medications: I have reviewed the patient's current medications.  Assessment/Plan: 1. Cholangiocarcinoma multiple liver masses and abdominal lymphadenopathy ? CTs 01/13/2020-rounded hypodense mass appears to arise from the pancreas neck, multiple rim-enhancing masses in the liver, primarily left liver with segmental dilation of the left lobe Intermatic bile ducts, ill-defined hypodensity the central liver with effacement of the left portal vein, enlarged portacaval and retroperitoneal lymph nodes ? Ultrasound-guided biopsy of the left liver lesion 01/19/2020-adenocarcinoma, cytokeratin 7+,MSS, tumor mutation burden 1, IDH1 R132C ? MRI abdomen 01/31/2020-poorly marginated central liver mass, multiple smaller similar  satellite liver masses, extrinsic mass-effect at the biliary hilum with intrahepatic biliary ductal dilatation throughout the left liver with mild Intermatic dilatation the superior right liver, no pancreas mass or ductal dilatation, normal spleen size, numerous enlarged enhancing lymph nodes at the porta hepatis, peripancreatic, portacaval, aortocaval, left periaortic chains ? Cycle 1 gemcitabine/cisplatin 02/04/2020 ? Cycle 2 gemcitabine/cisplatin 02/28/2020 ? Cycle 3 gemcitabine/cisplatin 03/20/2020 ? CT abdomen/pelvis 03/31/2020-mild decrease in central liver tumor, mild decrease in size of liver metastases and upper abdominal adenopathy, new splenomegaly ? Cycle 4 gemcitabine/cisplatin 04/10/2020 ? Cycle 5 gemcitabine/cisplatin 05/01/2020  2. Cough-likely related to diaphragmatic irritation from #1 3. Anorexia/weight loss 4. Hypercalcemia-likely hypercalcemia malignancy, status post intravenous hydration and Zometa 01/14/2020 5. Diabetes 6. Hyperlipidemia 7. Hypertension 8. History of peripheral neuropathy secondary to diabetes 9. Severe back pain following Fulphila-oxycodone prescribed   Disposition: Matthew Osborne appears stable.  He has completed 4 cycles of gemcitabine/cisplatin.  No clinical evidence of disease progression.  Plan to proceed with cycle 5 today as scheduled.  Dr. Benay Spice discussed adding durvalumab every 3 weeks pending insurance approval.  Potential toxicities reviewed including diarrhea, rash, allergic reaction, myocarditis, endocrinopathies, neuropathy, pneumonitis, hepatitis, arthritis.  Written information provided as well.  [name].  CBC from today adequate for treatment.  He will return for the day 8 treatment on 05/08/2020.  He will return for lab, follow-up, cycle 6 in 3 weeks.  He will contact the office in the interim with any problems.  Patient seen with Dr. Benay Spice.   Ned Card ANP/GNP-BC   05/01/2020  9:51  AM  This was a shared visit with Ned Card.  Matthew Osborne appears unchanged.  He has completed 4 cycles of gemcitabine/cisplatin.  The plan is to proceed with cycle 5 today.  I recommend adding durvalumab based on recent data confirming a benefit with the addition of durvalumab to chemotherapy.  We reviewed potential toxicities associated with durvalumab.  He agrees to proceed.  I performed a peer to peer visit with the insurance company [employer] last week and again today.  They are in agreement, but it will need to be approved by the insurance carrier.  The Cancer center pharmacy is working on drug replacement in the event durvalumab is denied.  I was present for greater than 50% of today's visit.  I performed medical decision making.  Julieanne Manson, MD

## 2020-05-01 NOTE — Progress Notes (Signed)
[facility] Spiritual Care Note  Followed up with Matthew Osborne in infusion. Provided pastoral presence, reflective listening, and witness to his stories as he shared deeply about values, significant people in his life, and meaning-making in the midst of cancer. Plan to reach out to him and his wife Matthew Osborne by phone for further support, as his care is moving to [place] [employer].   [place], [place], [facility] Pager [phone] Voicemail [phone]

## 2020-05-02 LAB — CANCER ANTIGEN 19-9: CA 19-9: 58 U/mL — ABNORMAL HIGH (ref 0–35)

## 2020-05-04 ENCOUNTER — Other Ambulatory Visit: Payer: Self-pay | Admitting: Nurse Practitioner

## 2020-05-04 ENCOUNTER — Other Ambulatory Visit: Payer: Self-pay | Admitting: *Deleted

## 2020-05-04 DIAGNOSIS — C221 Intrahepatic bile duct carcinoma: Secondary | ICD-10-CM

## 2020-05-05 ENCOUNTER — Other Ambulatory Visit (HOSPITAL_BASED_OUTPATIENT_CLINIC_OR_DEPARTMENT_OTHER): Payer: Self-pay

## 2020-05-05 MED ORDER — DEXCOM G6 SENSOR MISC
1 refills | Status: DC
  Filled 2020-05-05: qty 3, 30d supply, fill #0
  Filled 2020-06-02: qty 3, 30d supply, fill #1
  Filled 2020-07-04: qty 3, 30d supply, fill #2
  Filled 2020-08-04: qty 3, 30d supply, fill #3
  Filled 2020-09-26: qty 3, 30d supply, fill #4

## 2020-05-08 ENCOUNTER — Inpatient Hospital Stay: Payer: No Typology Code available for payment source

## 2020-05-08 ENCOUNTER — Ambulatory Visit: Payer: No Typology Code available for payment source

## 2020-05-08 ENCOUNTER — Encounter: Payer: Self-pay | Admitting: *Deleted

## 2020-05-08 ENCOUNTER — Other Ambulatory Visit (HOSPITAL_BASED_OUTPATIENT_CLINIC_OR_DEPARTMENT_OTHER): Payer: Self-pay

## 2020-05-08 ENCOUNTER — Other Ambulatory Visit: Payer: No Typology Code available for payment source

## 2020-05-08 ENCOUNTER — Other Ambulatory Visit: Payer: Self-pay

## 2020-05-08 ENCOUNTER — Inpatient Hospital Stay: Payer: No Typology Code available for payment source | Attending: Oncology

## 2020-05-08 VITALS — BP 124/87 | HR 63 | Temp 97.9°F | Resp 18 | Ht 74.0 in | Wt 230.0 lb

## 2020-05-08 DIAGNOSIS — R059 Cough, unspecified: Secondary | ICD-10-CM | POA: Diagnosis not present

## 2020-05-08 DIAGNOSIS — E785 Hyperlipidemia, unspecified: Secondary | ICD-10-CM | POA: Diagnosis not present

## 2020-05-08 DIAGNOSIS — C221 Intrahepatic bile duct carcinoma: Secondary | ICD-10-CM | POA: Insufficient documentation

## 2020-05-08 DIAGNOSIS — I10 Essential (primary) hypertension: Secondary | ICD-10-CM | POA: Diagnosis not present

## 2020-05-08 DIAGNOSIS — Z5111 Encounter for antineoplastic chemotherapy: Secondary | ICD-10-CM | POA: Diagnosis present

## 2020-05-08 DIAGNOSIS — E119 Type 2 diabetes mellitus without complications: Secondary | ICD-10-CM | POA: Diagnosis not present

## 2020-05-08 DIAGNOSIS — Z5189 Encounter for other specified aftercare: Secondary | ICD-10-CM | POA: Insufficient documentation

## 2020-05-08 DIAGNOSIS — Z5112 Encounter for antineoplastic immunotherapy: Secondary | ICD-10-CM | POA: Insufficient documentation

## 2020-05-08 DIAGNOSIS — R630 Anorexia: Secondary | ICD-10-CM | POA: Diagnosis not present

## 2020-05-08 DIAGNOSIS — H9313 Tinnitus, bilateral: Secondary | ICD-10-CM | POA: Diagnosis not present

## 2020-05-08 DIAGNOSIS — M549 Dorsalgia, unspecified: Secondary | ICD-10-CM | POA: Diagnosis not present

## 2020-05-08 LAB — CBC WITH DIFFERENTIAL (CANCER CENTER ONLY)
Abs Immature Granulocytes: 0.01 10*3/uL (ref 0.00–0.07)
Basophils Absolute: 0.1 10*3/uL (ref 0.0–0.1)
Basophils Relative: 2 %
Eosinophils Absolute: 0.2 10*3/uL (ref 0.0–0.5)
Eosinophils Relative: 3 %
HCT: 33.1 % — ABNORMAL LOW (ref 39.0–52.0)
Hemoglobin: 11.1 g/dL — ABNORMAL LOW (ref 13.0–17.0)
Immature Granulocytes: 0 %
Lymphocytes Relative: 17 %
Lymphs Abs: 0.9 10*3/uL (ref 0.7–4.0)
MCH: 33 pg (ref 26.0–34.0)
MCHC: 33.5 g/dL (ref 30.0–36.0)
MCV: 98.5 fL (ref 80.0–100.0)
Monocytes Absolute: 0.5 10*3/uL (ref 0.1–1.0)
Monocytes Relative: 10 %
Neutro Abs: 3.7 10*3/uL (ref 1.7–7.7)
Neutrophils Relative %: 68 %
Platelet Count: 211 10*3/uL (ref 150–400)
RBC: 3.36 MIL/uL — ABNORMAL LOW (ref 4.22–5.81)
RDW: 16.4 % — ABNORMAL HIGH (ref 11.5–15.5)
WBC Count: 5.4 10*3/uL (ref 4.0–10.5)
nRBC: 0 % (ref 0.0–0.2)

## 2020-05-08 LAB — CMP (CANCER CENTER ONLY)
ALT: 31 U/L (ref 0–44)
AST: 32 U/L (ref 15–41)
Albumin: 4.2 g/dL (ref 3.5–5.0)
Alkaline Phosphatase: 126 U/L (ref 38–126)
Anion gap: 7 (ref 5–15)
BUN: 17 mg/dL (ref 6–20)
CO2: 30 mmol/L (ref 22–32)
Calcium: 9.4 mg/dL (ref 8.9–10.3)
Chloride: 101 mmol/L (ref 98–111)
Creatinine: 0.74 mg/dL (ref 0.61–1.24)
GFR, Estimated: >60 mL/min (ref >60)
Glucose, Bld: 98 mg/dL (ref 70–99)
Potassium: 4.2 mmol/L (ref 3.5–5.1)
Sodium: 138 mmol/L (ref 135–145)
Total Bilirubin: 0.6 mg/dL (ref 0.3–1.2)
Total Protein: 7 g/dL (ref 6.5–8.1)

## 2020-05-08 MED ORDER — HEPARIN SOD (PORK) LOCK FLUSH 100 UNIT/ML IV SOLN
500.0000 [IU] | Freq: Once | INTRAVENOUS | Status: AC | PRN
  Administered 2020-05-08: 500 [IU]
  Filled 2020-05-08: qty 5

## 2020-05-08 MED ORDER — SODIUM CHLORIDE 0.9 % IV SOLN
25.0000 mg/m2 | Freq: Once | INTRAVENOUS | Status: AC
  Administered 2020-05-08: 56 mg via INTRAVENOUS
  Filled 2020-05-08: qty 56

## 2020-05-08 MED ORDER — SODIUM CHLORIDE 0.9% FLUSH
10.0000 mL | INTRAVENOUS | Status: DC | PRN
  Administered 2020-05-08: 10 mL
  Filled 2020-05-08: qty 10

## 2020-05-08 MED ORDER — POTASSIUM CHLORIDE IN NACL 20-0.9 MEQ/L-% IV SOLN
Freq: Once | INTRAVENOUS | Status: AC
Start: 2020-05-08 — End: 2020-05-08
  Filled 2020-05-08: qty 1000

## 2020-05-08 MED ORDER — SODIUM CHLORIDE 0.9 % IV SOLN
1000.0000 mg/m2 | Freq: Once | INTRAVENOUS | Status: AC
  Administered 2020-05-08: 2242 mg via INTRAVENOUS
  Filled 2020-05-08: qty 52.6

## 2020-05-08 MED ORDER — MAGNESIUM SULFATE 2 GM/50ML IV SOLN
2.0000 g | Freq: Once | INTRAVENOUS | Status: AC
Start: 2020-05-08 — End: 2020-05-08
  Administered 2020-05-08: 2 g via INTRAVENOUS
  Filled 2020-05-08: qty 50

## 2020-05-08 MED ORDER — SODIUM CHLORIDE 0.9 % IV SOLN
Freq: Once | INTRAVENOUS | Status: AC
  Filled 2020-05-08: qty 250

## 2020-05-08 MED ORDER — SODIUM CHLORIDE 0.9 % IV SOLN
150.0000 mg | Freq: Once | INTRAVENOUS | Status: AC
Start: 2020-05-08 — End: 2020-05-08
  Administered 2020-05-08: 150 mg via INTRAVENOUS
  Filled 2020-05-08: qty 150

## 2020-05-08 MED ORDER — PALONOSETRON HCL INJECTION 0.25 MG/5ML
0.2500 mg | Freq: Once | INTRAVENOUS | Status: AC
  Administered 2020-05-08: 0.25 mg via INTRAVENOUS

## 2020-05-08 MED ORDER — FAMOTIDINE IN NACL 20-0.9 MG/50ML-% IV SOLN
20.0000 mg | Freq: Once | INTRAVENOUS | Status: AC
  Administered 2020-05-08: 20 mg via INTRAVENOUS

## 2020-05-08 MED ORDER — DEXAMETHASONE SODIUM PHOSPHATE 10 MG/ML IJ SOLN
5.0000 mg | Freq: Once | INTRAMUSCULAR | Status: AC
  Administered 2020-05-08: 5 mg via INTRAVENOUS

## 2020-05-08 MED ORDER — SODIUM CHLORIDE 0.9 % IV SOLN
Freq: Once | INTRAVENOUS | Status: AC
Start: 2020-05-08 — End: 2020-05-08
  Filled 2020-05-08: qty 250

## 2020-05-08 NOTE — Patient Instructions (Addendum)
  [facility] Discharge Instructions for Patients Receiving Chemotherapy  Today you received the following chemotherapy agents Gemcitabine (GEMZAR) & Cisplatin (PLATINOL).  To help prevent nausea and vomiting after your treatment, we encourage you to take your nausea medication as prescribed.   If you develop nausea and vomiting that is not controlled by your nausea medication, call the clinic.   BELOW ARE SYMPTOMS THAT SHOULD BE REPORTED IMMEDIATELY:  *FEVER GREATER THAN 100.5 F  *CHILLS WITH OR WITHOUT FEVER  NAUSEA AND VOMITING THAT IS NOT CONTROLLED WITH YOUR NAUSEA MEDICATION  *UNUSUAL SHORTNESS OF BREATH  *UNUSUAL BRUISING OR BLEEDING  TENDERNESS IN MOUTH AND THROAT WITH OR WITHOUT PRESENCE OF ULCERS  *URINARY PROBLEMS  *BOWEL PROBLEMS  UNUSUAL RASH Items with * indicate a potential emergency and should be followed up as soon as possible.  Feel free to call the clinic should you have any questions or concerns at The clinic phone number is [phone].  Please show the [place] at check-in to the Emergency Department and triage nurse.

## 2020-05-08 NOTE — Progress Notes (Signed)
Call today from Heywood Iles, CM that the durvalumab was denied due to not being FDA approved for this use or NCCN guidelines. Forwarded denial letter to [employer].  Contact # for Heywood Iles is [phone].

## 2020-05-09 ENCOUNTER — Inpatient Hospital Stay: Payer: No Typology Code available for payment source

## 2020-05-09 ENCOUNTER — Ambulatory Visit: Payer: No Typology Code available for payment source

## 2020-05-09 ENCOUNTER — Encounter: Payer: Self-pay | Admitting: Oncology

## 2020-05-09 VITALS — BP 117/70 | HR 70 | Temp 98.4°F | Resp 18

## 2020-05-09 DIAGNOSIS — C221 Intrahepatic bile duct carcinoma: Secondary | ICD-10-CM

## 2020-05-09 DIAGNOSIS — Z5111 Encounter for antineoplastic chemotherapy: Secondary | ICD-10-CM | POA: Diagnosis not present

## 2020-05-09 MED ORDER — PEGFILGRASTIM-JMDB 6 MG/0.6ML ~~LOC~~ SOSY
6.0000 mg | PREFILLED_SYRINGE | Freq: Once | SUBCUTANEOUS | Status: AC
Start: 2020-05-09 — End: 2020-05-09
  Administered 2020-05-09: 6 mg via SUBCUTANEOUS
  Filled 2020-05-09: qty 0.6

## 2020-05-09 NOTE — Patient Instructions (Signed)
Pegfilgrastim injection What is this medicine? PEGFILGRASTIM (PEG fil gra stim) is a long-acting granulocyte colony-stimulating factor that stimulates the growth of neutrophils, a type of white blood cell important in the body's fight against infection. It is used to reduce the incidence of fever and infection in patients with certain types of cancer who are receiving chemotherapy that affects the bone marrow, and to increase survival after being exposed to high doses of radiation. This medicine may be used for other purposes; ask your health care provider or pharmacist if you have questions. COMMON BRAND NAME(S): Fulphila, Neulasta, Nyvepria, UDENYCA, Ziextenzo What should I tell my health care provider before I take this medicine? They need to know if you have any of these conditions:  kidney disease  latex allergy  ongoing radiation therapy  sickle cell disease  skin reactions to acrylic adhesives (On-Body Injector only)  an unusual or allergic reaction to pegfilgrastim, filgrastim, other medicines, foods, dyes, or preservatives  pregnant or trying to get pregnant  breast-feeding How should I use this medicine? This medicine is for injection under the skin. If you get this medicine at home, you will be taught how to prepare and give the pre-filled syringe or how to use the On-body Injector. Refer to the patient Instructions for Use for detailed instructions. Use exactly as directed. Tell your healthcare provider immediately if you suspect that the On-body Injector may not have performed as intended or if you suspect the use of the On-body Injector resulted in a missed or partial dose. It is important that you put your used needles and syringes in a special sharps container. Do not put them in a trash can. If you do not have a sharps container, call your pharmacist or healthcare provider to get one. Talk to your pediatrician regarding the use of this medicine in children. While this drug  may be prescribed for selected conditions, precautions do apply. Overdosage: If you think you have taken too much of this medicine contact a poison control center or emergency room at once. NOTE: This medicine is only for you. Do not share this medicine with others. What if I miss a dose? It is important not to miss your dose. Call your doctor or health care professional if you miss your dose. If you miss a dose due to an On-body Injector failure or leakage, a new dose should be administered as soon as possible using a single prefilled syringe for manual use. What may interact with this medicine? Interactions have not been studied. This list may not describe all possible interactions. Give your health care provider a list of all the medicines, herbs, non-prescription drugs, or dietary supplements you use. Also tell them if you smoke, drink alcohol, or use illegal drugs. Some items may interact with your medicine. What should I watch for while using this medicine? Your condition will be monitored carefully while you are receiving this medicine. You may need blood work done while you are taking this medicine. Talk to your health care provider about your risk of cancer. You may be more at risk for certain types of cancer if you take this medicine. If you are going to need a MRI, CT scan, or other procedure, tell your doctor that you are using this medicine (On-Body Injector only). What side effects may I notice from receiving this medicine? Side effects that you should report to your doctor or health care professional as soon as possible:  allergic reactions (skin rash, itching or hives, swelling of   the face, lips, or tongue)  back pain  dizziness  fever  pain, redness, or irritation at site where injected  pinpoint red spots on the skin  red or dark-brown urine  shortness of breath or breathing problems  stomach or side pain, or pain at the shoulder  swelling  tiredness  trouble  passing urine or change in the amount of urine  unusual bruising or bleeding Side effects that usually do not require medical attention (report to your doctor or health care professional if they continue or are bothersome):  bone pain  muscle pain This list may not describe all possible side effects. Call your doctor for medical advice about side effects. You may report side effects to FDA at 1-800-FDA-1088. Where should I keep my medicine? Keep out of the reach of children. If you are using this medicine at home, you will be instructed on how to store it. Throw away any unused medicine after the expiration date on the label. NOTE: This sheet is a summary. It may not cover all possible information. If you have questions about this medicine, talk to your doctor, pharmacist, or health care provider.  2021 Elsevier/Gold Standard (2019-02-12 13:20:51)  

## 2020-05-09 NOTE — Progress Notes (Signed)
Return call from [place], CM. She is sending off the case for physician review as an appeal this afternoon so the 2nd denial can be obtained if not approved and will send update to office when completed.

## 2020-05-10 ENCOUNTER — Telehealth: Payer: Self-pay | Admitting: Oncology

## 2020-05-10 NOTE — Progress Notes (Addendum)
..  The following Medication: Imfinzi has been approved thru [place] as Assistance Program. Enrollment period is 05/10/2020 to 05/10/2021.  Assistance ID: [id number].  Reason for Assistance: Self Pay First DOS: 05/22/2020.

## 2020-05-10 NOTE — Telephone Encounter (Signed)
Called wife back about my chart message for RN and scheduled next appt on Monday . Left message statin we cannot reschedule 4/19 appt to 4/18 due to Dr. Bernette Redbird availability. - left call back number for any questions

## 2020-05-14 MED FILL — Insulin Pen Needle 31 G X 5 MM (1/5" or 3/16"): 25 days supply | Qty: 100 | Fill #0 | Status: AC

## 2020-05-15 ENCOUNTER — Other Ambulatory Visit (HOSPITAL_COMMUNITY): Payer: Self-pay

## 2020-05-15 MED FILL — Insulin Lispro Soln Pen-injector 100 Unit/ML (1 Unit Dial): SUBCUTANEOUS | 41 days supply | Qty: 15 | Fill #0 | Status: AC

## 2020-05-15 MED FILL — Lidocaine-Prilocaine Cream 2.5-2.5%: CUTANEOUS | 30 days supply | Qty: 30 | Fill #0 | Status: AC

## 2020-05-15 MED FILL — Prochlorperazine Maleate Tab 10 MG (Base Equivalent): ORAL | 15 days supply | Qty: 60 | Fill #0 | Status: AC

## 2020-05-16 ENCOUNTER — Other Ambulatory Visit (HOSPITAL_COMMUNITY): Payer: Self-pay

## 2020-05-18 ENCOUNTER — Other Ambulatory Visit: Payer: Self-pay | Admitting: Oncology

## 2020-05-19 ENCOUNTER — Other Ambulatory Visit: Payer: Self-pay

## 2020-05-19 ENCOUNTER — Encounter: Payer: Self-pay | Admitting: Nurse Practitioner

## 2020-05-19 ENCOUNTER — Telehealth: Payer: Self-pay | Admitting: Nurse Practitioner

## 2020-05-19 ENCOUNTER — Inpatient Hospital Stay (HOSPITAL_BASED_OUTPATIENT_CLINIC_OR_DEPARTMENT_OTHER): Payer: No Typology Code available for payment source | Admitting: Nurse Practitioner

## 2020-05-19 VITALS — BP 125/83 | HR 65 | Temp 98.0°F | Resp 18 | Ht 74.0 in | Wt 238.6 lb

## 2020-05-19 DIAGNOSIS — C221 Intrahepatic bile duct carcinoma: Secondary | ICD-10-CM | POA: Diagnosis not present

## 2020-05-19 DIAGNOSIS — Z5111 Encounter for antineoplastic chemotherapy: Secondary | ICD-10-CM | POA: Diagnosis not present

## 2020-05-19 NOTE — Telephone Encounter (Signed)
Scheduled appt per 4/15 los - pt wife is aware of appts.

## 2020-05-19 NOTE — Progress Notes (Addendum)
[facility] OFFICE PROGRESS NOTE   Diagnosis: Cholangiocarcinoma  INTERVAL HISTORY:   Mr. Brauer returns as scheduled.  He completed cycle 5 gemcitabine/cisplatin beginning 05/01/2020.  He notes he is started about 4 days after each treatment.  No nausea/vomiting.  Occasional ringing in ears.  No hearing loss.  No mouth sores.  No diarrhea.  Continued intermittent neuropathy symptoms.  Symptoms do not interfere with activity.  He continues to have intermittent pain described as "spasms" at the right "rib cage".  No rash in this area.  Objective:  Vital signs in last 24 hours:  Blood pressure 125/83, pulse 65, temperature 98 F (36.7 C), temperature source Tympanic, resp. rate 18, height 6' 2"  (1.88 m), weight 238 lb 9.6 oz (108.2 kg), SpO2 100 %.    HEENT: No thrush or ulcers. Resp: Lungs clear bilaterally. Cardio: Regular rate and rhythm. GI: Abdomen soft and nontender.  No hepatomegaly. Vascular: No leg edema. Skin: No rash. Musculoskeletal: Nontender over the right lower anterolateral ribs. Port-A-Cath without erythema  Lab Results:  Lab Results  Component Value Date   WBC 5.4 05/08/2020   HGB 11.1 (L) 05/08/2020   HCT 33.1 (L) 05/08/2020   MCV 98.5 05/08/2020   PLT 211 05/08/2020   NEUTROABS 3.7 05/08/2020    Imaging:  No results found.  Medications: I have reviewed the patient's current medications.  Assessment/Plan: 1. Cholangiocarcinoma multiple liver masses and abdominal lymphadenopathy ? CTs 01/13/2020-rounded hypodense mass appears to arise from the pancreas neck, multiple rim-enhancing masses in the liver, primarily left liver with segmental dilation of the left lobe Intermatic bile ducts, ill-defined hypodensity the central liver with effacement of the left portal vein, enlarged portacaval and retroperitoneal lymph nodes ? Ultrasound-guided biopsy of the left liver lesion 01/19/2020-adenocarcinoma, cytokeratin 7+,MSS, tumor mutation burden  1, IDH1 R132C ? MRI abdomen 01/31/2020-poorly marginated central liver mass, multiple smaller similar satellite liver masses, extrinsic mass-effect at the biliary hilum with intrahepatic biliary ductal dilatation throughout the left liver with mild Intermatic dilatation the superior right liver, no pancreas mass or ductal dilatation, normal spleen size, numerous enlarged enhancing lymph nodes at the porta hepatis, peripancreatic, portacaval, aortocaval, left periaortic chains ? Cycle 1 gemcitabine/cisplatin 02/04/2020 ? Cycle 2 gemcitabine/cisplatin 02/28/2020 ? Cycle 3 gemcitabine/cisplatin 03/20/2020 ? CT abdomen/pelvis 03/31/2020-mild decrease in central liver tumor, mild decrease in size of liver metastases and upper abdominal adenopathy, new splenomegaly ? Cycle 4 gemcitabine/cisplatin 04/10/2020 ? Cycle 5 gemcitabine/cisplatin 05/01/2020 ? Cycle 6 gemcitabine/cisplatin plus durvalumab 05/22/2020  2. Cough-likely related to diaphragmatic irritation from #1 3. Anorexia/weight loss 4. Hypercalcemia-likely hypercalcemia malignancy, status post intravenous hydration and Zometa 01/14/2020 5. Diabetes 6. Hyperlipidemia 7. Hypertension 8. History of peripheral neuropathy secondary to diabetes 9. Severe back pain following Fulphila-oxycodone prescribed   Disposition: Mr. Splawn appears stable.  He has completed 5 cycles of gemcitabine/cisplatin.  Plan to proceed with cycle 6 as scheduled beginning 05/22/2020.  Dr. Benay Spice discussed adding durvalumab to chemotherapy.  Potential toxicities again reviewed.  Mr. Cavanah agrees to proceed.  Durvalumab will be added cycle 6-day 1 on 05/22/2020.  Restaging CTs prior to next office visit in 3 weeks.  He will return for lab, follow-up, cycle 7 gemcitabine/cisplatin 06/12/2020.  He will contact the office in the interim with any problems.  Patient seen with Dr. Benay Spice.    Ned Card ANP/GNP-BC   05/19/2020  9:23 AM  This was a shared visit with Ned Card.  Mr. Witherington will complete cycle 6 gemcitabine/cisplatin beginning 05/22/2020.  We discussed the risks and potential benefit of adding durvalumab.  We reviewed potential toxicities associated with this agent.  He understands data suggesting an improvement with the addition of durvalumab to gemcitabine/cisplatin is preliminary.  He agrees to proceed with durvalumab.  I was present for greater than 50% of today's visit.  I performed medical decision making.  Julieanne Manson, MD

## 2020-05-22 ENCOUNTER — Inpatient Hospital Stay (HOSPITAL_BASED_OUTPATIENT_CLINIC_OR_DEPARTMENT_OTHER): Payer: No Typology Code available for payment source | Admitting: Oncology

## 2020-05-22 ENCOUNTER — Ambulatory Visit: Payer: No Typology Code available for payment source | Admitting: Oncology

## 2020-05-22 ENCOUNTER — Other Ambulatory Visit: Payer: No Typology Code available for payment source

## 2020-05-22 ENCOUNTER — Inpatient Hospital Stay: Payer: No Typology Code available for payment source

## 2020-05-22 ENCOUNTER — Other Ambulatory Visit: Payer: Self-pay

## 2020-05-22 ENCOUNTER — Other Ambulatory Visit: Payer: Self-pay | Admitting: *Deleted

## 2020-05-22 VITALS — BP 129/88 | HR 62 | Temp 98.5°F | Resp 18

## 2020-05-22 DIAGNOSIS — C221 Intrahepatic bile duct carcinoma: Secondary | ICD-10-CM | POA: Diagnosis not present

## 2020-05-22 DIAGNOSIS — Z5111 Encounter for antineoplastic chemotherapy: Secondary | ICD-10-CM | POA: Diagnosis not present

## 2020-05-22 DIAGNOSIS — Z95828 Presence of other vascular implants and grafts: Secondary | ICD-10-CM

## 2020-05-22 LAB — CBC WITH DIFFERENTIAL (CANCER CENTER ONLY)
Abs Immature Granulocytes: 0.04 10*3/uL (ref 0.00–0.07)
Basophils Absolute: 0.1 10*3/uL (ref 0.0–0.1)
Basophils Relative: 1 %
Eosinophils Absolute: 0.5 10*3/uL (ref 0.0–0.5)
Eosinophils Relative: 6 %
HCT: 36.4 % — ABNORMAL LOW (ref 39.0–52.0)
Hemoglobin: 12 g/dL — ABNORMAL LOW (ref 13.0–17.0)
Immature Granulocytes: 1 %
Lymphocytes Relative: 19 %
Lymphs Abs: 1.5 10*3/uL (ref 0.7–4.0)
MCH: 33.3 pg (ref 26.0–34.0)
MCHC: 33 g/dL (ref 30.0–36.0)
MCV: 101.1 fL — ABNORMAL HIGH (ref 80.0–100.0)
Monocytes Absolute: 0.9 10*3/uL (ref 0.1–1.0)
Monocytes Relative: 12 %
Neutro Abs: 4.7 10*3/uL (ref 1.7–7.7)
Neutrophils Relative %: 61 %
Platelet Count: 206 10*3/uL (ref 150–400)
RBC: 3.6 MIL/uL — ABNORMAL LOW (ref 4.22–5.81)
RDW: 15.7 % — ABNORMAL HIGH (ref 11.5–15.5)
WBC Count: 7.7 10*3/uL (ref 4.0–10.5)
nRBC: 0 % (ref 0.0–0.2)

## 2020-05-22 LAB — CMP (CANCER CENTER ONLY)
ALT: 27 U/L (ref 0–44)
AST: 30 U/L (ref 15–41)
Albumin: 4.2 g/dL (ref 3.5–5.0)
Alkaline Phosphatase: 173 U/L — ABNORMAL HIGH (ref 38–126)
Anion gap: 7 (ref 5–15)
BUN: 13 mg/dL (ref 6–20)
CO2: 30 mmol/L (ref 22–32)
Calcium: 9.7 mg/dL (ref 8.9–10.3)
Chloride: 102 mmol/L (ref 98–111)
Creatinine: 0.77 mg/dL (ref 0.61–1.24)
GFR, Estimated: >60 mL/min (ref >60)
Glucose, Bld: 129 mg/dL — ABNORMAL HIGH (ref 70–99)
Potassium: 4.3 mmol/L (ref 3.5–5.1)
Sodium: 139 mmol/L (ref 135–145)
Total Bilirubin: 0.5 mg/dL (ref 0.3–1.2)
Total Protein: 7.4 g/dL (ref 6.5–8.1)

## 2020-05-22 LAB — TSH: TSH: 1.687 u[IU]/mL (ref 0.350–4.500)

## 2020-05-22 LAB — MAGNESIUM: Magnesium: 2 mg/dL (ref 1.7–2.4)

## 2020-05-22 MED ORDER — SODIUM CHLORIDE 0.9 % IV SOLN
150.0000 mg | Freq: Once | INTRAVENOUS | Status: AC
  Administered 2020-05-22: 150 mg via INTRAVENOUS
  Filled 2020-05-22: qty 5

## 2020-05-22 MED ORDER — HEPARIN SOD (PORK) LOCK FLUSH 100 UNIT/ML IV SOLN
500.0000 [IU] | Freq: Once | INTRAVENOUS | Status: AC
  Administered 2020-05-22: 500 [IU] via INTRAVENOUS
  Filled 2020-05-22: qty 5

## 2020-05-22 MED ORDER — PALONOSETRON HCL INJECTION 0.25 MG/5ML
0.2500 mg | Freq: Once | INTRAVENOUS | Status: AC
  Administered 2020-05-22: 0.25 mg via INTRAVENOUS
  Filled 2020-05-22: qty 5

## 2020-05-22 MED ORDER — SODIUM CHLORIDE 0.9% FLUSH
10.0000 mL | INTRAVENOUS | Status: DC | PRN
  Administered 2020-05-22: 10 mL
  Filled 2020-05-22: qty 10

## 2020-05-22 MED ORDER — SODIUM CHLORIDE 0.9 % IV SOLN
Freq: Once | INTRAVENOUS | Status: AC
  Filled 2020-05-22: qty 250

## 2020-05-22 MED ORDER — SODIUM CHLORIDE 0.9 % IV SOLN
25.0000 mg/m2 | Freq: Once | INTRAVENOUS | Status: AC
  Administered 2020-05-22: 56 mg via INTRAVENOUS
  Filled 2020-05-22: qty 56

## 2020-05-22 MED ORDER — MAGNESIUM SULFATE 2 GM/50ML IV SOLN
2.0000 g | Freq: Once | INTRAVENOUS | Status: AC
Start: 2020-05-22 — End: 2020-05-22
  Administered 2020-05-22: 2 g via INTRAVENOUS
  Filled 2020-05-22: qty 50

## 2020-05-22 MED ORDER — SODIUM CHLORIDE 0.9 % IV SOLN
1000.0000 mg/m2 | Freq: Once | INTRAVENOUS | Status: AC
  Administered 2020-05-22: 2242 mg via INTRAVENOUS
  Filled 2020-05-22: qty 58.97

## 2020-05-22 MED ORDER — HEPARIN SOD (PORK) LOCK FLUSH 100 UNIT/ML IV SOLN
500.0000 [IU] | Freq: Once | INTRAVENOUS | Status: DC | PRN
  Filled 2020-05-22: qty 5

## 2020-05-22 MED ORDER — SODIUM CHLORIDE 0.9 % IV SOLN
150.0000 mg | Freq: Once | INTRAVENOUS | Status: AC
  Filled 2020-05-22: qty 5

## 2020-05-22 MED ORDER — POTASSIUM CHLORIDE IN NACL 20-0.9 MEQ/L-% IV SOLN
Freq: Once | INTRAVENOUS | Status: AC
  Filled 2020-05-22: qty 1000

## 2020-05-22 MED ORDER — DEXAMETHASONE SODIUM PHOSPHATE 10 MG/ML IJ SOLN
5.0000 mg | Freq: Once | INTRAMUSCULAR | Status: AC
  Administered 2020-05-22: 5 mg via INTRAVENOUS
  Filled 2020-05-22: qty 1

## 2020-05-22 MED ORDER — SODIUM CHLORIDE 0.9% FLUSH
10.0000 mL | Freq: Once | INTRAVENOUS | Status: AC
  Administered 2020-05-22: 10 mL via INTRAVENOUS
  Filled 2020-05-22: qty 10

## 2020-05-22 MED ORDER — FAMOTIDINE IN NACL 20-0.9 MG/50ML-% IV SOLN
20.0000 mg | Freq: Once | INTRAVENOUS | Status: AC
  Administered 2020-05-22: 20 mg via INTRAVENOUS
  Filled 2020-05-22: qty 50

## 2020-05-22 MED ORDER — SODIUM CHLORIDE 0.9 % IV SOLN
INTRAVENOUS | Status: DC
  Filled 2020-05-22: qty 250

## 2020-05-22 MED ORDER — SODIUM CHLORIDE 0.9 % IV SOLN
1500.0000 mg | Freq: Once | INTRAVENOUS | Status: AC
  Administered 2020-05-22: 1500 mg via INTRAVENOUS
  Filled 2020-05-22: qty 30

## 2020-05-22 NOTE — Patient Instructions (Signed)
[facility] Discharge Instructions for Patients Receiving Chemotherapy  Today you received the following chemotherapy agents Durvalumab (IMFINZI), Gemcitabine (GEMZAR) & Cisplatin (PLATINOL).  To help prevent nausea and vomiting after your treatment, we encourage you to take your nausea medication as prescribed.   If you develop nausea and vomiting that is not controlled by your nausea medication, call the clinic.   BELOW ARE SYMPTOMS THAT SHOULD BE REPORTED IMMEDIATELY:  *FEVER GREATER THAN 100.5 F  *CHILLS WITH OR WITHOUT FEVER  NAUSEA AND VOMITING THAT IS NOT CONTROLLED WITH YOUR NAUSEA MEDICATION  *UNUSUAL SHORTNESS OF BREATH  *UNUSUAL BRUISING OR BLEEDING  TENDERNESS IN MOUTH AND THROAT WITH OR WITHOUT PRESENCE OF ULCERS  *URINARY PROBLEMS  *BOWEL PROBLEMS  UNUSUAL RASH Items with * indicate a potential emergency and should be followed up as soon as possible.  Feel free to call the clinic should you have any questions or concerns at The clinic phone number is [phone].  Please show the [facility] at check-in to the Emergency Department and triage nurse.  Durvalumab injection What is this medicine? DURVALUMAB (dur VAL ue mab) is a monoclonal antibody. It is used to treat lung cancer. This medicine may be used for other purposes; ask your health care provider or pharmacist if you have questions. COMMON BRAND NAME(S): IMFINZI What should I tell my health care provider before I take this medicine? They need to know if you have any of these conditions:  autoimmune diseases like Crohn's disease, ulcerative colitis, or lupus  have had or planning to have an allogeneic stem cell transplant (uses someone else's stem cells)  history of organ transplant  history of radiation to the chest  nervous system problems like myasthenia gravis or Guillain-Barre syndrome  an unusual or allergic reaction to durvalumab, other medicines, foods,  dyes, or preservatives  pregnant or trying to get pregnant  breast-feeding How should I use this medicine? This medicine is for infusion into a vein. It is given by a health care professional in a hospital or clinic setting. A special MedGuide will be given to you before each treatment. Be sure to read this information carefully each time. Talk to your pediatrician regarding the use of this medicine in children. Special care may be needed. Overdosage: If you think you have taken too much of this medicine contact a poison control center or emergency room at once. NOTE: This medicine is only for you. Do not share this medicine with others. What if I miss a dose? It is important not to miss your dose. Call your doctor or health care professional if you are unable to keep an appointment. What may interact with this medicine? Interactions have not been studied. This list may not describe all possible interactions. Give your health care provider a list of all the medicines, herbs, non-prescription drugs, or dietary supplements you use. Also tell them if you smoke, drink alcohol, or use illegal drugs. Some items may interact with your medicine. What should I watch for while using this medicine? This drug may make you feel generally unwell. Continue your course of treatment even though you feel ill unless your doctor tells you to stop. You may need blood work done while you are taking this medicine. Do not become pregnant while taking this medicine or for 3 months after stopping it. Women should inform their doctor if they wish to become pregnant or think they might be pregnant. There is a potential for serious side  effects to an unborn child. Talk to your health care professional or pharmacist for more information. Do not breast-feed an infant while taking this medicine or for 3 months after stopping it. What side effects may I notice from receiving this medicine? Side effects that you should report to  your doctor or health care professional as soon as possible:  allergic reactions like skin rash, itching or hives, swelling of the face, lips, or tongue  black, tarry stools  bloody or watery diarrhea  breathing problems  change in emotions or moods  change in sex drive  changes in vision  chest pain or chest tightness  chills  confusion  cough  facial flushing  fever  headache  signs and symptoms of high blood sugar such as dizziness; dry mouth; dry skin; fruity breath; nausea; stomach pain; increased hunger or thirst; increased urination  signs and symptoms of liver injury like dark yellow or brown urine; general ill feeling or flu-like symptoms; light-colored stools; loss of appetite; nausea; right upper belly pain; unusually weak or tired; yellowing of the eyes or skin  stomach pain  trouble passing urine or change in the amount of urine  weight gain or weight loss Side effects that usually do not require medical attention (report these to your doctor or health care professional if they continue or are bothersome):  bone pain  constipation  loss of appetite  muscle pain  nausea  swelling of the ankles, feet, hands  tiredness This list may not describe all possible side effects. Call your doctor for medical advice about side effects. You may report side effects to FDA at 1-800-FDA-1088. Where should I keep my medicine? This drug is given in a hospital or clinic and will not be stored at home. NOTE: This sheet is a summary. It may not cover all possible information. If you have questions about this medicine, talk to your doctor, pharmacist, or health care provider.  2021 Elsevier/Gold Standard (2019-04-01 13:01:29)

## 2020-05-23 ENCOUNTER — Ambulatory Visit: Payer: No Typology Code available for payment source

## 2020-05-23 ENCOUNTER — Other Ambulatory Visit: Payer: No Typology Code available for payment source

## 2020-05-23 ENCOUNTER — Ambulatory Visit: Payer: No Typology Code available for payment source | Admitting: Oncology

## 2020-05-23 ENCOUNTER — Other Ambulatory Visit (HOSPITAL_COMMUNITY): Payer: Self-pay

## 2020-05-23 MED FILL — Insulin Pen Needle 31 G X 5 MM (1/5" or 3/16"): 25 days supply | Qty: 100 | Fill #1 | Status: AC

## 2020-05-24 ENCOUNTER — Telehealth: Payer: Self-pay | Admitting: *Deleted

## 2020-05-24 NOTE — Telephone Encounter (Signed)
Called wife w/CT appointment for 5/3 at [facility]. Arrive at 0945 for 10:00 scan. NPO 4 hours prior and oral contrast at 0800 and 0900. Confirmed he already has his contrast. Notified managed care to work on [place].

## 2020-05-29 ENCOUNTER — Inpatient Hospital Stay: Payer: No Typology Code available for payment source

## 2020-05-29 ENCOUNTER — Ambulatory Visit: Payer: No Typology Code available for payment source | Admitting: Nurse Practitioner

## 2020-05-29 ENCOUNTER — Other Ambulatory Visit: Payer: Self-pay

## 2020-05-29 VITALS — BP 145/81 | HR 69 | Temp 97.8°F | Resp 18 | Ht 74.0 in | Wt 235.0 lb

## 2020-05-29 DIAGNOSIS — Z5111 Encounter for antineoplastic chemotherapy: Secondary | ICD-10-CM | POA: Diagnosis not present

## 2020-05-29 DIAGNOSIS — C221 Intrahepatic bile duct carcinoma: Secondary | ICD-10-CM

## 2020-05-29 LAB — CBC WITH DIFFERENTIAL (CANCER CENTER ONLY)
Abs Immature Granulocytes: 0.01 10*3/uL (ref 0.00–0.07)
Basophils Absolute: 0.1 10*3/uL (ref 0.0–0.1)
Basophils Relative: 3 %
Eosinophils Absolute: 0.2 10*3/uL (ref 0.0–0.5)
Eosinophils Relative: 5 %
HCT: 34.4 % — ABNORMAL LOW (ref 39.0–52.0)
Hemoglobin: 11.6 g/dL — ABNORMAL LOW (ref 13.0–17.0)
Immature Granulocytes: 0 %
Lymphocytes Relative: 30 %
Lymphs Abs: 1.1 10*3/uL (ref 0.7–4.0)
MCH: 34 pg (ref 26.0–34.0)
MCHC: 33.7 g/dL (ref 30.0–36.0)
MCV: 100.9 fL — ABNORMAL HIGH (ref 80.0–100.0)
Monocytes Absolute: 0.6 10*3/uL (ref 0.1–1.0)
Monocytes Relative: 17 %
Neutro Abs: 1.6 10*3/uL — ABNORMAL LOW (ref 1.7–7.7)
Neutrophils Relative %: 45 %
Platelet Count: 226 10*3/uL (ref 150–400)
RBC: 3.41 MIL/uL — ABNORMAL LOW (ref 4.22–5.81)
RDW: 14.3 % (ref 11.5–15.5)
WBC Count: 3.6 10*3/uL — ABNORMAL LOW (ref 4.0–10.5)
nRBC: 0 % (ref 0.0–0.2)

## 2020-05-29 LAB — CMP (CANCER CENTER ONLY)
ALT: 33 U/L (ref 0–44)
AST: 30 U/L (ref 15–41)
Albumin: 4.3 g/dL (ref 3.5–5.0)
Alkaline Phosphatase: 128 U/L — ABNORMAL HIGH (ref 38–126)
Anion gap: 6 (ref 5–15)
BUN: 15 mg/dL (ref 6–20)
CO2: 31 mmol/L (ref 22–32)
Calcium: 10.1 mg/dL (ref 8.9–10.3)
Chloride: 99 mmol/L (ref 98–111)
Creatinine: 0.71 mg/dL (ref 0.61–1.24)
GFR, Estimated: >60 mL/min (ref >60)
Glucose, Bld: 194 mg/dL — ABNORMAL HIGH (ref 70–99)
Potassium: 4.3 mmol/L (ref 3.5–5.1)
Sodium: 136 mmol/L (ref 135–145)
Total Bilirubin: 0.5 mg/dL (ref 0.3–1.2)
Total Protein: 7.4 g/dL (ref 6.5–8.1)

## 2020-05-29 LAB — CANCER ANTIGEN 19-9: CA 19-9: 77 U/mL — ABNORMAL HIGH (ref 0–35)

## 2020-05-29 LAB — MAGNESIUM: Magnesium: 1.9 mg/dL (ref 1.7–2.4)

## 2020-05-29 MED ORDER — POTASSIUM CHLORIDE IN NACL 20-0.9 MEQ/L-% IV SOLN
Freq: Once | INTRAVENOUS | Status: AC
  Filled 2020-05-29: qty 1000

## 2020-05-29 MED ORDER — SODIUM CHLORIDE 0.9% FLUSH
10.0000 mL | INTRAVENOUS | Status: DC | PRN
  Administered 2020-05-29: 10 mL
  Filled 2020-05-29: qty 10

## 2020-05-29 MED ORDER — SODIUM CHLORIDE 0.9 % IV SOLN
1000.0000 mg/m2 | Freq: Once | INTRAVENOUS | Status: AC
  Administered 2020-05-29: 2242 mg via INTRAVENOUS
  Filled 2020-05-29: qty 58.97

## 2020-05-29 MED ORDER — PALONOSETRON HCL INJECTION 0.25 MG/5ML
0.2500 mg | Freq: Once | INTRAVENOUS | Status: AC
  Administered 2020-05-29: 0.25 mg via INTRAVENOUS
  Filled 2020-05-29: qty 5

## 2020-05-29 MED ORDER — MAGNESIUM SULFATE 2 GM/50ML IV SOLN
2.0000 g | Freq: Once | INTRAVENOUS | Status: AC
  Administered 2020-05-29: 2 g via INTRAVENOUS
  Filled 2020-05-29: qty 50

## 2020-05-29 MED ORDER — SODIUM CHLORIDE 0.9 % IV SOLN
150.0000 mg | Freq: Once | INTRAVENOUS | Status: AC
  Administered 2020-05-29: 150 mg via INTRAVENOUS
  Filled 2020-05-29: qty 5

## 2020-05-29 MED ORDER — SODIUM CHLORIDE 0.9 % IV SOLN
Freq: Once | INTRAVENOUS | Status: AC
  Filled 2020-05-29: qty 250

## 2020-05-29 MED ORDER — DEXAMETHASONE SODIUM PHOSPHATE 10 MG/ML IJ SOLN
5.0000 mg | Freq: Once | INTRAMUSCULAR | Status: AC
  Administered 2020-05-29: 5 mg via INTRAVENOUS
  Filled 2020-05-29: qty 1

## 2020-05-29 MED ORDER — HEPARIN SOD (PORK) LOCK FLUSH 100 UNIT/ML IV SOLN
500.0000 [IU] | Freq: Once | INTRAVENOUS | Status: AC | PRN
  Administered 2020-05-29: 500 [IU]
  Filled 2020-05-29: qty 5

## 2020-05-29 MED ORDER — SODIUM CHLORIDE 0.9 % IV SOLN
25.0000 mg/m2 | Freq: Once | INTRAVENOUS | Status: AC
  Administered 2020-05-29: 56 mg via INTRAVENOUS
  Filled 2020-05-29: qty 56

## 2020-05-29 MED ORDER — FAMOTIDINE IN NACL 20-0.9 MG/50ML-% IV SOLN
20.0000 mg | Freq: Once | INTRAVENOUS | Status: AC
  Administered 2020-05-29: 20 mg via INTRAVENOUS
  Filled 2020-05-29: qty 50

## 2020-05-29 NOTE — Patient Instructions (Signed)
Matthew Osborne   Discharge Instructions:  Thank you for choosing [facility] to provide your oncology and hematology care.   If you have a lab appointment with the [facility], please go directly to the [facility] and check in at the registration area.   Wear comfortable clothing and clothing appropriate for easy access to any Portacath or PICC line.   We strive to give you quality time with your provider. You may need to reschedule your appointment if you arrive late (15 or more minutes).  Arriving late affects you and other patients whose appointments are after yours.  Also, if you miss three or more appointments without notifying the office, you may be dismissed from the clinic at the provider's discretion.      For prescription refill requests, have your pharmacy contact our office and allow 72 hours for refills to be completed.    Today you received the following chemotherapy and/or immunotherapy agents Gemcitabine (GEMZAR) & Cisplatin (PLATINOL).    To help prevent nausea and vomiting after your treatment, we encourage you to take your nausea medication as directed.  BELOW ARE SYMPTOMS THAT SHOULD BE REPORTED IMMEDIATELY: . *FEVER GREATER THAN 100.4 F (38 C) OR HIGHER . *CHILLS OR SWEATING . *NAUSEA AND VOMITING THAT IS NOT CONTROLLED WITH YOUR NAUSEA MEDICATION . *UNUSUAL SHORTNESS OF BREATH . *UNUSUAL BRUISING OR BLEEDING . *URINARY PROBLEMS (pain or burning when urinating, or frequent urination) . *BOWEL PROBLEMS (unusual diarrhea, constipation, pain near the anus) . TENDERNESS IN MOUTH AND THROAT WITH OR WITHOUT PRESENCE OF ULCERS (sore throat, sores in mouth, or a toothache) . UNUSUAL RASH, SWELLING OR PAIN  . UNUSUAL VAGINAL DISCHARGE OR ITCHING   Items with * indicate a potential emergency and should be followed up as soon as possible or go to the Emergency Department if any problems should occur.  Please show the CHEMOTHERAPY  ALERT CARD or IMMUNOTHERAPY ALERT CARD at check-in to the Emergency Department and triage nurse.  Should you have questions after your visit or need to cancel or reschedule your appointment, please contact [facility]  Dept: [phone]  and follow the prompts.  Office hours are 8:00 a.m. to 4:30 p.m. Monday - Friday. Please note that voicemails left after 4:00 p.m. may not be returned until the following business day.  We are closed weekends and major holidays. You have access to a nurse at all times for urgent questions. Please call the main number to the clinic Dept: [phone] and follow the prompts.   For any non-urgent questions, you may also contact your provider using MyChart. We now offer e-Visits for anyone 45 and older to request care online for non-urgent symptoms. For details visit [email or web address].   Also download the MyChart app! Go to the app store, search "MyChart", open the app, select [facility], and log in with your MyChart username and password.  Due to Covid, a mask is required upon entering the hospital/clinic. If you do not have a mask, one will be given to you upon arrival. For doctor visits, patients may have 1 support person aged 45 or older with them. For treatment visits, patients cannot have anyone with them due to current Covid guidelines and our immunocompromised population.

## 2020-05-30 ENCOUNTER — Encounter: Payer: Self-pay | Admitting: General Practice

## 2020-05-30 ENCOUNTER — Inpatient Hospital Stay: Payer: No Typology Code available for payment source

## 2020-05-30 VITALS — BP 138/90 | HR 67 | Temp 98.5°F | Resp 20

## 2020-05-30 DIAGNOSIS — C221 Intrahepatic bile duct carcinoma: Secondary | ICD-10-CM

## 2020-05-30 DIAGNOSIS — Z5111 Encounter for antineoplastic chemotherapy: Secondary | ICD-10-CM | POA: Diagnosis not present

## 2020-05-30 MED ORDER — PEGFILGRASTIM-JMDB 6 MG/0.6ML ~~LOC~~ SOSY
6.0000 mg | PREFILLED_SYRINGE | Freq: Once | SUBCUTANEOUS | Status: AC
  Administered 2020-05-30: 6 mg via SUBCUTANEOUS

## 2020-05-30 NOTE — Progress Notes (Signed)
[facility] Spiritual Care Note  Phoned for follow-up support, catching Jonty's wife [name], whom I know from a previous work environment. She was appreciative of call and caregiver support. Couple is taking a 20th anniversary trip to [place] on 5/21. They are aware of ongoing chaplain availability, and I plan to phone Matheo the week after his 5/3 scan for a pastoral check-in.   [place], [place], [facility] Pager [phone] Voicemail [phone]

## 2020-06-02 ENCOUNTER — Other Ambulatory Visit (HOSPITAL_BASED_OUTPATIENT_CLINIC_OR_DEPARTMENT_OTHER): Payer: Self-pay

## 2020-06-05 ENCOUNTER — Other Ambulatory Visit (HOSPITAL_BASED_OUTPATIENT_CLINIC_OR_DEPARTMENT_OTHER): Payer: Self-pay

## 2020-06-06 ENCOUNTER — Ambulatory Visit (HOSPITAL_BASED_OUTPATIENT_CLINIC_OR_DEPARTMENT_OTHER)
Admission: RE | Admit: 2020-06-06 | Discharge: 2020-06-06 | Disposition: A | Discharge status: Home / Self Care | Payer: No Typology Code available for payment source | Source: Ambulatory Visit | Attending: Nurse Practitioner | Admitting: Nurse Practitioner

## 2020-06-06 ENCOUNTER — Inpatient Hospital Stay: Payer: No Typology Code available for payment source | Attending: Oncology

## 2020-06-06 ENCOUNTER — Other Ambulatory Visit: Payer: Self-pay

## 2020-06-06 DIAGNOSIS — D6959 Other secondary thrombocytopenia: Secondary | ICD-10-CM | POA: Diagnosis not present

## 2020-06-06 DIAGNOSIS — Z5189 Encounter for other specified aftercare: Secondary | ICD-10-CM | POA: Diagnosis not present

## 2020-06-06 DIAGNOSIS — E785 Hyperlipidemia, unspecified: Secondary | ICD-10-CM | POA: Insufficient documentation

## 2020-06-06 DIAGNOSIS — E1142 Type 2 diabetes mellitus with diabetic polyneuropathy: Secondary | ICD-10-CM | POA: Insufficient documentation

## 2020-06-06 DIAGNOSIS — Z5111 Encounter for antineoplastic chemotherapy: Secondary | ICD-10-CM | POA: Diagnosis present

## 2020-06-06 DIAGNOSIS — C221 Intrahepatic bile duct carcinoma: Secondary | ICD-10-CM | POA: Insufficient documentation

## 2020-06-06 DIAGNOSIS — C249 Malignant neoplasm of biliary tract, unspecified: Secondary | ICD-10-CM

## 2020-06-06 DIAGNOSIS — Z79899 Other long term (current) drug therapy: Secondary | ICD-10-CM | POA: Insufficient documentation

## 2020-06-06 DIAGNOSIS — Z5112 Encounter for antineoplastic immunotherapy: Secondary | ICD-10-CM | POA: Insufficient documentation

## 2020-06-06 MED ORDER — HEPARIN SOD (PORK) LOCK FLUSH 100 UNIT/ML IV SOLN
500.0000 [IU] | Freq: Once | INTRAVENOUS | Status: AC
  Administered 2020-06-06: 500 [IU] via INTRAVENOUS
  Filled 2020-06-06: qty 5

## 2020-06-06 MED ORDER — SODIUM CHLORIDE 0.9% FLUSH
10.0000 mL | Freq: Once | INTRAVENOUS | Status: AC
  Administered 2020-06-06: 10 mL via INTRAVENOUS
  Filled 2020-06-06: qty 10

## 2020-06-06 MED ORDER — IOHEXOL 300 MG/ML  SOLN
100.0000 mL | Freq: Once | INTRAMUSCULAR | Status: AC | PRN
  Administered 2020-06-06: 100 mL via INTRAVENOUS

## 2020-06-06 NOTE — Addendum Note (Signed)
Addended by: Tedd Sias on: 06/06/2020 11:27 AM   Modules accepted: Orders

## 2020-06-09 ENCOUNTER — Other Ambulatory Visit (HOSPITAL_COMMUNITY): Payer: Self-pay

## 2020-06-09 ENCOUNTER — Other Ambulatory Visit (HOSPITAL_BASED_OUTPATIENT_CLINIC_OR_DEPARTMENT_OTHER): Payer: Self-pay

## 2020-06-09 ENCOUNTER — Other Ambulatory Visit: Payer: Self-pay | Admitting: Family Medicine

## 2020-06-09 MED ORDER — INSULIN GLARGINE-YFGN 100 UNIT/ML ~~LOC~~ SOPN
70.0000 [IU] | PEN_INJECTOR | Freq: Two times a day (BID) | SUBCUTANEOUS | 11 refills | Status: DC
  Filled 2020-06-09: qty 10, 7d supply, fill #0
  Filled 2020-06-16: qty 30, 28d supply, fill #0
  Filled 2020-06-16: qty 10, 7d supply, fill #0
  Filled 2020-08-08: qty 30, 28d supply, fill #1
  Filled 2020-08-15: qty 30, 22d supply, fill #0
  Filled 2020-08-18: qty 30, 28d supply, fill #0

## 2020-06-09 MED FILL — Insulin Glargine Soln Pen-Injector 100 Unit/ML: SUBCUTANEOUS | 90 days supply | Qty: 126 | Fill #0 | Status: CN

## 2020-06-09 MED FILL — Insulin Pen Needle 31 G X 5 MM (1/5" or 3/16"): 25 days supply | Qty: 100 | Fill #0 | Status: AC

## 2020-06-09 MED FILL — Insulin Lispro Soln Pen-injector 100 Unit/ML (1 Unit Dial): SUBCUTANEOUS | 41 days supply | Qty: 15 | Fill #0 | Status: AC

## 2020-06-09 MED FILL — Insulin Lispro Soln Pen-injector 100 Unit/ML (1 Unit Dial): SUBCUTANEOUS | 41 days supply | Qty: 15 | Fill #0 | Status: CN

## 2020-06-11 ENCOUNTER — Other Ambulatory Visit: Payer: Self-pay | Admitting: Oncology

## 2020-06-12 ENCOUNTER — Other Ambulatory Visit: Payer: Self-pay | Admitting: Oncology

## 2020-06-12 ENCOUNTER — Inpatient Hospital Stay: Payer: No Typology Code available for payment source

## 2020-06-12 ENCOUNTER — Inpatient Hospital Stay (HOSPITAL_BASED_OUTPATIENT_CLINIC_OR_DEPARTMENT_OTHER): Payer: No Typology Code available for payment source | Admitting: Oncology

## 2020-06-12 ENCOUNTER — Other Ambulatory Visit: Payer: Self-pay

## 2020-06-12 VITALS — BP 123/83 | HR 66 | Temp 98.7°F | Resp 18 | Ht 74.0 in | Wt 241.6 lb

## 2020-06-12 DIAGNOSIS — C221 Intrahepatic bile duct carcinoma: Secondary | ICD-10-CM

## 2020-06-12 DIAGNOSIS — Z5112 Encounter for antineoplastic immunotherapy: Secondary | ICD-10-CM | POA: Diagnosis not present

## 2020-06-12 LAB — CMP (CANCER CENTER ONLY)
ALT: 24 U/L (ref 0–44)
AST: 35 U/L (ref 15–41)
Albumin: 3.9 g/dL (ref 3.5–5.0)
Alkaline Phosphatase: 144 U/L — ABNORMAL HIGH (ref 38–126)
Anion gap: 7 (ref 5–15)
BUN: 11 mg/dL (ref 6–20)
CO2: 30 mmol/L (ref 22–32)
Calcium: 8.9 mg/dL (ref 8.9–10.3)
Chloride: 100 mmol/L (ref 98–111)
Creatinine: 0.78 mg/dL (ref 0.61–1.24)
GFR, Estimated: >60 mL/min (ref >60)
Glucose, Bld: 170 mg/dL — ABNORMAL HIGH (ref 70–99)
Potassium: 4.2 mmol/L (ref 3.5–5.1)
Sodium: 137 mmol/L (ref 135–145)
Total Bilirubin: 0.4 mg/dL (ref 0.3–1.2)
Total Protein: 7.1 g/dL (ref 6.5–8.1)

## 2020-06-12 LAB — CBC WITH DIFFERENTIAL (CANCER CENTER ONLY)
Abs Immature Granulocytes: 0.02 10*3/uL (ref 0.00–0.07)
Basophils Absolute: 0.1 10*3/uL (ref 0.0–0.1)
Basophils Relative: 1 %
Eosinophils Absolute: 0.5 10*3/uL (ref 0.0–0.5)
Eosinophils Relative: 8 %
HCT: 35.2 % — ABNORMAL LOW (ref 39.0–52.0)
Hemoglobin: 11.5 g/dL — ABNORMAL LOW (ref 13.0–17.0)
Immature Granulocytes: 0 %
Lymphocytes Relative: 18 %
Lymphs Abs: 1.1 10*3/uL (ref 0.7–4.0)
MCH: 33.3 pg (ref 26.0–34.0)
MCHC: 32.7 g/dL (ref 30.0–36.0)
MCV: 102 fL — ABNORMAL HIGH (ref 80.0–100.0)
Monocytes Absolute: 0.7 10*3/uL (ref 0.1–1.0)
Monocytes Relative: 11 %
Neutro Abs: 3.9 10*3/uL (ref 1.7–7.7)
Neutrophils Relative %: 62 %
Platelet Count: 185 10*3/uL (ref 150–400)
RBC: 3.45 MIL/uL — ABNORMAL LOW (ref 4.22–5.81)
RDW: 14.6 % (ref 11.5–15.5)
WBC Count: 6.2 10*3/uL (ref 4.0–10.5)
nRBC: 0 % (ref 0.0–0.2)

## 2020-06-12 LAB — MAGNESIUM: Magnesium: 2 mg/dL (ref 1.7–2.4)

## 2020-06-12 MED ORDER — SODIUM CHLORIDE 0.9 % IV SOLN
1500.0000 mg | Freq: Once | INTRAVENOUS | Status: AC
  Administered 2020-06-12: 1500 mg via INTRAVENOUS
  Filled 2020-06-12: qty 30

## 2020-06-12 MED ORDER — HEPARIN SOD (PORK) LOCK FLUSH 100 UNIT/ML IV SOLN
500.0000 [IU] | Freq: Once | INTRAVENOUS | Status: AC | PRN
  Administered 2020-06-12: 500 [IU]
  Filled 2020-06-12: qty 5

## 2020-06-12 MED ORDER — DEXAMETHASONE SODIUM PHOSPHATE 10 MG/ML IJ SOLN
5.0000 mg | Freq: Once | INTRAMUSCULAR | Status: AC
  Administered 2020-06-12: 5 mg via INTRAVENOUS
  Filled 2020-06-12: qty 1

## 2020-06-12 MED ORDER — MAGNESIUM SULFATE 2 GM/50ML IV SOLN
2.0000 g | Freq: Once | INTRAVENOUS | Status: AC
  Administered 2020-06-12: 2 g via INTRAVENOUS
  Filled 2020-06-12: qty 50

## 2020-06-12 MED ORDER — FAMOTIDINE 20 MG IN NS 100 ML IVPB
20.0000 mg | Freq: Once | INTRAVENOUS | Status: AC
  Administered 2020-06-12: 20 mg via INTRAVENOUS
  Filled 2020-06-12: qty 100

## 2020-06-12 MED ORDER — POTASSIUM CHLORIDE IN NACL 20-0.9 MEQ/L-% IV SOLN
Freq: Once | INTRAVENOUS | Status: AC
  Filled 2020-06-12: qty 1000

## 2020-06-12 MED ORDER — SODIUM CHLORIDE 0.9 % IV SOLN
25.0000 mg/m2 | Freq: Once | INTRAVENOUS | Status: AC
  Administered 2020-06-12: 56 mg via INTRAVENOUS
  Filled 2020-06-12: qty 56

## 2020-06-12 MED ORDER — SODIUM CHLORIDE 0.9 % IV SOLN
150.0000 mg | Freq: Once | INTRAVENOUS | Status: AC
  Administered 2020-06-12: 150 mg via INTRAVENOUS
  Filled 2020-06-12: qty 150

## 2020-06-12 MED ORDER — SODIUM CHLORIDE 0.9 % IV SOLN
Freq: Once | INTRAVENOUS | Status: AC
  Filled 2020-06-12: qty 250

## 2020-06-12 MED ORDER — SODIUM CHLORIDE 0.9 % IV SOLN
1000.0000 mg/m2 | Freq: Once | INTRAVENOUS | Status: AC
  Administered 2020-06-12: 2242 mg via INTRAVENOUS
  Filled 2020-06-12: qty 26.3

## 2020-06-12 MED ORDER — SODIUM CHLORIDE 0.9% FLUSH
10.0000 mL | INTRAVENOUS | Status: DC | PRN
  Administered 2020-06-12: 10 mL
  Filled 2020-06-12: qty 10

## 2020-06-12 MED ORDER — PALONOSETRON HCL INJECTION 0.25 MG/5ML
0.2500 mg | Freq: Once | INTRAVENOUS | Status: AC
  Administered 2020-06-12: 0.25 mg via INTRAVENOUS
  Filled 2020-06-12: qty 5

## 2020-06-12 MED ORDER — SODIUM CHLORIDE 0.9% FLUSH
10.0000 mL | INTRAVENOUS | Status: DC
  Filled 2020-06-12: qty 10

## 2020-06-12 NOTE — Patient Instructions (Signed)
[facility]    Discharge Instructions:  Thank you for choosing [facility] to provide your oncology and hematology care.   If you have a lab appointment with the [facility], please go directly to the [facility] and check in at the registration area.   Wear comfortable clothing and clothing appropriate for easy access to any Portacath or PICC line.   We strive to give you quality time with your provider. You may need to reschedule your appointment if you arrive late (15 or more minutes).  Arriving late affects you and other patients whose appointments are after yours.  Also, if you miss three or more appointments without notifying the office, you may be dismissed from the clinic at the provider's discretion.      For prescription refill requests, have your pharmacy contact our office and allow 72 hours for refills to be completed.    Today you received the following chemotherapy and/or immunotherapy agents Durvalumab (IMFINZI), Gemcitabine (GEMZAR) & Cisplatin (PLATINOL).   To help prevent nausea and vomiting after your treatment, we encourage you to take your nausea medication as directed.  BELOW ARE SYMPTOMS THAT SHOULD BE REPORTED IMMEDIATELY: . *FEVER GREATER THAN 100.4 F (38 C) OR HIGHER . *CHILLS OR SWEATING . *NAUSEA AND VOMITING THAT IS NOT CONTROLLED WITH YOUR NAUSEA MEDICATION . *UNUSUAL SHORTNESS OF BREATH . *UNUSUAL BRUISING OR BLEEDING . *URINARY PROBLEMS (pain or burning when urinating, or frequent urination) . *BOWEL PROBLEMS (unusual diarrhea, constipation, pain near the anus) . TENDERNESS IN MOUTH AND THROAT WITH OR WITHOUT PRESENCE OF ULCERS (sore throat, sores in mouth, or a toothache) . UNUSUAL RASH, SWELLING OR PAIN  . UNUSUAL VAGINAL DISCHARGE OR ITCHING   Items with * indicate a potential emergency and should be followed up as soon as possible or go to the Emergency Department if any problems should occur.  Please  show the CHEMOTHERAPY ALERT CARD or IMMUNOTHERAPY ALERT CARD at check-in to the Emergency Department and triage nurse.  Should you have questions after your visit or need to cancel or reschedule your appointment, please contact [facility]  Dept: [phone]  and follow the prompts.  Office hours are 8:00 a.m. to 4:30 p.m. Monday - Friday. Please note that voicemails left after 4:00 p.m. may not be returned until the following business day.  We are closed weekends and major holidays. You have access to a nurse at all times for urgent questions. Please call the main number to the clinic Dept: [phone] and follow the prompts.   For any non-urgent questions, you may also contact your provider using MyChart. We now offer e-Visits for anyone 45 and older to request care online for non-urgent symptoms. For details visit [email or web address].   Also download the MyChart app! Go to the app store, search "MyChart", open the app, select [facility], and log in with your MyChart username and password.  Due to Covid, a mask is required upon entering the hospital/clinic. If you do not have a mask, one will be given to you upon arrival. For doctor visits, patients may have 1 support person aged 45 or older with them. For treatment visits, patients cannot have anyone with them due to current Covid guidelines and our immunocompromised population.   Durvalumab injection What is this medicine? DURVALUMAB (dur VAL ue mab) is a monoclonal antibody. It is used to treat lung cancer. This medicine may be used for other purposes; ask your health  care provider or pharmacist if you have questions. COMMON BRAND NAME(S): IMFINZI What should I tell my health care provider before I take this medicine? They need to know if you have any of these conditions:  autoimmune diseases like Crohn's disease, ulcerative colitis, or lupus  have had or planning to have an allogeneic stem cell transplant  (uses someone else's stem cells)  history of organ transplant  history of radiation to the chest  nervous system problems like myasthenia gravis or Guillain-Barre syndrome  an unusual or allergic reaction to durvalumab, other medicines, foods, dyes, or preservatives  pregnant or trying to get pregnant  breast-feeding How should I use this medicine? This medicine is for infusion into a vein. It is given by a health care professional in a hospital or clinic setting. A special MedGuide will be given to you before each treatment. Be sure to read this information carefully each time. Talk to your pediatrician regarding the use of this medicine in children. Special care may be needed. Overdosage: If you think you have taken too much of this medicine contact a poison control center or emergency room at once. NOTE: This medicine is only for you. Do not share this medicine with others. What if I miss a dose? It is important not to miss your dose. Call your doctor or health care professional if you are unable to keep an appointment. What may interact with this medicine? Interactions have not been studied. This list may not describe all possible interactions. Give your health care provider a list of all the medicines, herbs, non-prescription drugs, or dietary supplements you use. Also tell them if you smoke, drink alcohol, or use illegal drugs. Some items may interact with your medicine. What should I watch for while using this medicine? This drug may make you feel generally unwell. Continue your course of treatment even though you feel ill unless your doctor tells you to stop. You may need blood work done while you are taking this medicine. Do not become pregnant while taking this medicine or for 3 months after stopping it. Women should inform their doctor if they wish to become pregnant or think they might be pregnant. There is a potential for serious side effects to an unborn child. Talk to your  health care professional or pharmacist for more information. Do not breast-feed an infant while taking this medicine or for 3 months after stopping it. What side effects may I notice from receiving this medicine? Side effects that you should report to your doctor or health care professional as soon as possible:  allergic reactions like skin rash, itching or hives, swelling of the face, lips, or tongue  black, tarry stools  bloody or watery diarrhea  breathing problems  change in emotions or moods  change in sex drive  changes in vision  chest pain or chest tightness  chills  confusion  cough  facial flushing  fever  headache  signs and symptoms of high blood sugar such as dizziness; dry mouth; dry skin; fruity breath; nausea; stomach pain; increased hunger or thirst; increased urination  signs and symptoms of liver injury like dark yellow or brown urine; general ill feeling or flu-like symptoms; light-colored stools; loss of appetite; nausea; right upper belly pain; unusually weak or tired; yellowing of the eyes or skin  stomach pain  trouble passing urine or change in the amount of urine  weight gain or weight loss Side effects that usually do not require medical attention (report these to  your doctor or health care professional if they continue or are bothersome):  bone pain  constipation  loss of appetite  muscle pain  nausea  swelling of the ankles, feet, hands  tiredness This list may not describe all possible side effects. Call your doctor for medical advice about side effects. You may report side effects to FDA at 1-800-FDA-1088. Where should I keep my medicine? This drug is given in a hospital or clinic and will not be stored at home. NOTE: This sheet is a summary. It may not cover all possible information. If you have questions about this medicine, talk to your doctor, pharmacist, or health care provider.  2021 Elsevier/Gold Standard (2019-04-01  13:01:29)  Gemcitabine injection What is this medicine? GEMCITABINE (jem SYE ta been) is a chemotherapy drug. This medicine is used to treat many types of cancer like breast cancer, lung cancer, pancreatic cancer, and ovarian cancer. This medicine may be used for other purposes; ask your health care provider or pharmacist if you have questions. COMMON BRAND NAME(S): Gemzar, Infugem What should I tell my health care provider before I take this medicine? They need to know if you have any of these conditions:  blood disorders  infection  kidney disease  liver disease  lung or breathing disease, like asthma  recent or ongoing radiation therapy  an unusual or allergic reaction to gemcitabine, other chemotherapy, other medicines, foods, dyes, or preservatives  pregnant or trying to get pregnant  breast-feeding How should I use this medicine? This drug is given as an infusion into a vein. It is administered in a hospital or clinic by a specially trained health care professional. Talk to your pediatrician regarding the use of this medicine in children. Special care may be needed. Overdosage: If you think you have taken too much of this medicine contact a poison control center or emergency room at once. NOTE: This medicine is only for you. Do not share this medicine with others. What if I miss a dose? It is important not to miss your dose. Call your doctor or health care professional if you are unable to keep an appointment. What may interact with this medicine?  medicines to increase blood counts like filgrastim, pegfilgrastim, sargramostim  some other chemotherapy drugs like cisplatin  vaccines Talk to your doctor or health care professional before taking any of these medicines:  acetaminophen  aspirin  ibuprofen  ketoprofen  naproxen This list may not describe all possible interactions. Give your health care provider a list of all the medicines, herbs, non-prescription  drugs, or dietary supplements you use. Also tell them if you smoke, drink alcohol, or use illegal drugs. Some items may interact with your medicine. What should I watch for while using this medicine? Visit your doctor for checks on your progress. This drug may make you feel generally unwell. This is not uncommon, as chemotherapy can affect healthy cells as well as cancer cells. Report any side effects. Continue your course of treatment even though you feel ill unless your doctor tells you to stop. In some cases, you may be given additional medicines to help with side effects. Follow all directions for their use. Call your doctor or health care professional for advice if you get a fever, chills or sore throat, or other symptoms of a cold or flu. Do not treat yourself. This drug decreases your body's ability to fight infections. Try to avoid being around people who are sick. This medicine may increase your risk to bruise or  bleed. Call your doctor or health care professional if you notice any unusual bleeding. Be careful brushing and flossing your teeth or using a toothpick because you may get an infection or bleed more easily. If you have any dental work done, tell your dentist you are receiving this medicine. Avoid taking products that contain aspirin, acetaminophen, ibuprofen, naproxen, or ketoprofen unless instructed by your doctor. These medicines may hide a fever. Do not become pregnant while taking this medicine or for 6 months after stopping it. Women should inform their doctor if they wish to become pregnant or think they might be pregnant. Men should not father a child while taking this medicine and for 3 months after stopping it. There is a potential for serious side effects to an unborn child. Talk to your health care professional or pharmacist for more information. Do not breast-feed an infant while taking this medicine or for at least 1 week after stopping it. Men should inform their doctors if  they wish to father a child. This medicine may lower sperm counts. Talk with your doctor or health care professional if you are concerned about your fertility. What side effects may I notice from receiving this medicine? Side effects that you should report to your doctor or health care professional as soon as possible:  allergic reactions like skin rash, itching or hives, swelling of the face, lips, or tongue  breathing problems  pain, redness, or irritation at site where injected  signs and symptoms of a dangerous change in heartbeat or heart rhythm like chest pain; dizziness; fast or irregular heartbeat; palpitations; feeling faint or lightheaded, falls; breathing problems  signs of decreased platelets or bleeding - bruising, pinpoint red spots on the skin, black, tarry stools, blood in the urine  signs of decreased red blood cells - unusually weak or tired, feeling faint or lightheaded, falls  signs of infection - fever or chills, cough, sore throat, pain or difficulty passing urine  signs and symptoms of kidney injury like trouble passing urine or change in the amount of urine  signs and symptoms of liver injury like dark yellow or brown urine; general ill feeling or flu-like symptoms; light-colored stools; loss of appetite; nausea; right upper belly pain; unusually weak or tired; yellowing of the eyes or skin  swelling of ankles, feet, hands Side effects that usually do not require medical attention (report to your doctor or health care professional if they continue or are bothersome):  constipation  diarrhea  hair loss  loss of appetite  nausea  rash  vomiting This list may not describe all possible side effects. Call your doctor for medical advice about side effects. You may report side effects to FDA at 1-800-FDA-1088. Where should I keep my medicine? This drug is given in a hospital or clinic and will not be stored at home. NOTE: This sheet is a summary. It may not  cover all possible information. If you have questions about this medicine, talk to your doctor, pharmacist, or health care provider.  2021 Elsevier/Gold Standard (2017-04-16 18:06:11)  Cisplatin injection What is this medicine? CISPLATIN (SIS pla tin) is a chemotherapy drug. It targets fast dividing cells, like cancer cells, and causes these cells to die. This medicine is used to treat many types of cancer like bladder, ovarian, and testicular cancers. This medicine may be used for other purposes; ask your health care provider or pharmacist if you have questions. COMMON BRAND NAME(S): Platinol, Platinol -AQ What should I tell my health care  provider before I take this medicine? They need to know if you have any of these conditions:  eye disease, vision problems  hearing problems  kidney disease  low blood counts, like white cells, platelets, or red blood cells  tingling of the fingers or toes, or other nerve disorder  an unusual or allergic reaction to cisplatin, carboplatin, oxaliplatin, other medicines, foods, dyes, or preservatives  pregnant or trying to get pregnant  breast-feeding How should I use this medicine? This drug is given as an infusion into a vein. It is administered in a hospital or clinic by a specially trained health care professional. Talk to your pediatrician regarding the use of this medicine in children. Special care may be needed. Overdosage: If you think you have taken too much of this medicine contact a poison control center or emergency room at once. NOTE: This medicine is only for you. Do not share this medicine with others. What if I miss a dose? It is important not to miss a dose. Call your doctor or health care professional if you are unable to keep an appointment. What may interact with this medicine? This medicine may interact with the following medications:  foscarnet  certain antibiotics like amikacin, gentamicin, neomycin, polymyxin B,  streptomycin, tobramycin, vancomycin This list may not describe all possible interactions. Give your health care provider a list of all the medicines, herbs, non-prescription drugs, or dietary supplements you use. Also tell them if you smoke, drink alcohol, or use illegal drugs. Some items may interact with your medicine. What should I watch for while using this medicine? Your condition will be monitored carefully while you are receiving this medicine. You will need important blood work done while you are taking this medicine. This drug may make you feel generally unwell. This is not uncommon, as chemotherapy can affect healthy cells as well as cancer cells. Report any side effects. Continue your course of treatment even though you feel ill unless your doctor tells you to stop. This medicine may increase your risk of getting an infection. Call your healthcare professional for advice if you get a fever, chills, or sore throat, or other symptoms of a cold or flu. Do not treat yourself. Try to avoid being around people who are sick. Avoid taking medicines that contain aspirin, acetaminophen, ibuprofen, naproxen, or ketoprofen unless instructed by your healthcare professional. These medicines may hide a fever. This medicine may increase your risk to bruise or bleed. Call your doctor or health care professional if you notice any unusual bleeding. Be careful brushing and flossing your teeth or using a toothpick because you may get an infection or bleed more easily. If you have any dental work done, tell your dentist you are receiving this medicine. Do not become pregnant while taking this medicine or for 14 months after stopping it. Women should inform their healthcare professional if they wish to become pregnant or think they might be pregnant. Men should not father a child while taking this medicine and for 11 months after stopping it. There is potential for serious side effects to an unborn child. Talk to your  healthcare professional for more information. Do not breast-feed an infant while taking this medicine. This medicine has caused ovarian failure in some women. This medicine may make it more difficult to get pregnant. Talk to your healthcare professional if you are concerned about your fertility. This medicine has caused decreased sperm counts in some men. This may make it more difficult to father a child.  Talk to your healthcare professional if you are concerned about your fertility. Drink fluids as directed while you are taking this medicine. This will help protect your kidneys. Call your doctor or health care professional if you get diarrhea. Do not treat yourself. What side effects may I notice from receiving this medicine? Side effects that you should report to your doctor or health care professional as soon as possible:  allergic reactions like skin rash, itching or hives, swelling of the face, lips, or tongue  blurred vision  changes in vision  decreased hearing or ringing of the ears  nausea, vomiting  pain, redness, or irritation at site where injected  pain, tingling, numbness in the hands or feet  signs and symptoms of bleeding such as bloody or black, tarry stools; red or dark brown urine; spitting up blood or brown material that looks like coffee grounds; red spots on the skin; unusual bruising or bleeding from the eyes, gums, or nose  signs and symptoms of infection like fever; chills; cough; sore throat; pain or trouble passing urine  signs and symptoms of kidney injury like trouble passing urine or change in the amount of urine  signs and symptoms of low red blood cells or anemia such as unusually weak or tired; feeling faint or lightheaded; falls; breathing problems Side effects that usually do not require medical attention (report to your doctor or health care professional if they continue or are bothersome):  loss of appetite  mouth sores  muscle cramps This  list may not describe all possible side effects. Call your doctor for medical advice about side effects. You may report side effects to FDA at 1-800-FDA-1088. Where should I keep my medicine? This drug is given in a hospital or clinic and will not be stored at home. NOTE: This sheet is a summary. It may not cover all possible information. If you have questions about this medicine, talk to your doctor, pharmacist, or health care provider.  2021 Elsevier/Gold Standard (2018-01-16 15:59:17)

## 2020-06-12 NOTE — Progress Notes (Signed)
[facility] OFFICE PROGRESS NOTE   Diagnosis: Cholangiocarcinoma  INTERVAL HISTORY:   Matthew Osborne completed another cycle of gemcitabine/cisplatin/Durvalumab beginning 05/22/2020.  No nausea/vomiting, rash, diarrhea, or neuropathy symptoms.  He has malaise following chemotherapy.  He developed flushing after receiving Durvalumab.  His blood sugar has been running high.  He is maintained on long-acting insulin and a sliding scale.  He takes frequent naps.  No cough.  He has intermittent abdominal pain.  Objective:  Vital signs in last 24 hours:  Blood pressure 123/83, pulse 66, temperature 98.7 F (37.1 C), temperature source Oral, resp. rate 18, height $RemoveBe'6\' 2"'ocjCPangB$  (1.88 m), weight 241 lb 9.6 oz (109.6 kg), SpO2 100 %.    HEENT: No thrush or ulcers Resp: Lungs clear bilaterally Cardio: Regular rate and rhythm GI: No hepatosplenomegaly, nontender Vascular: No leg edema    Portacath/PICC-without erythema  Lab Results:  Lab Results  Component Value Date   WBC 6.2 06/12/2020   HGB 11.5 (L) 06/12/2020   HCT 35.2 (L) 06/12/2020   MCV 102.0 (H) 06/12/2020   PLT 185 06/12/2020   NEUTROABS 3.9 06/12/2020    CMP  Lab Results  Component Value Date   NA 136 05/29/2020   K 4.3 05/29/2020   CL 99 05/29/2020   CO2 31 05/29/2020   GLUCOSE 194 (H) 05/29/2020   BUN 15 05/29/2020   CREATININE 0.71 05/29/2020   CALCIUM 10.1 05/29/2020   PROT 7.4 05/29/2020   ALBUMIN 4.3 05/29/2020   AST 30 05/29/2020   ALT 33 05/29/2020   ALKPHOS 128 (H) 05/29/2020   BILITOT 0.5 05/29/2020   GFRNONAA >60 05/29/2020   GFRAA 127 01/11/2020    Lab Results  Component Value Date   CEA1 2.15 01/14/2020    Medications: I have reviewed the patient's current medications.   Assessment/Plan: 1. Cholangiocarcinoma multiple liver masses and abdominal lymphadenopathy ? CTs 01/13/2020-rounded hypodense mass appears to arise from the pancreas neck, multiple rim-enhancing masses in the liver,  primarily left liver with segmental dilation of the left lobe Intermatic bile ducts, ill-defined hypodensity the central liver with effacement of the left portal vein, enlarged portacaval and retroperitoneal lymph nodes ? Ultrasound-guided biopsy of the left liver lesion 01/19/2020-adenocarcinoma, cytokeratin 7+,MSS, tumor mutation burden 1, IDH1 R132C ? MRI abdomen 01/31/2020-poorly marginated central liver mass, multiple smaller similar satellite liver masses, extrinsic mass-effect at the biliary hilum with intrahepatic biliary ductal dilatation throughout the left liver with mild Intermatic dilatation the superior right liver, no pancreas mass or ductal dilatation, normal spleen size, numerous enlarged enhancing lymph nodes at the porta hepatis, peripancreatic, portacaval, aortocaval, left periaortic chains ? Cycle 1 gemcitabine/cisplatin 02/04/2020 ? Cycle 2 gemcitabine/cisplatin 02/28/2020 ? Cycle 3 gemcitabine/cisplatin 03/20/2020 ? CT abdomen/pelvis 03/31/2020-mild decrease in central liver tumor, mild decrease in size of liver metastases and upper abdominal adenopathy, new splenomegaly ? Cycle 4 gemcitabine/cisplatin 04/10/2020 ? Cycle 5 gemcitabine/cisplatin 05/01/2020 ? Cycle 6 gemcitabine/cisplatin plus durvalumab 05/22/2020 ? CTs 06/06/2020- dominant central liver mass mildly enlarged, other liver lesions and abdominal adenopathy is stable, no evidence of metastatic disease to the chest ? Cycle 7 gemcitabine/cisplatin plus Durvalumab  2. Cough-likely related to diaphragmatic irritation from #1 3. Anorexia/weight loss 4. Hypercalcemia-likely hypercalcemia malignancy, status post intravenous hydration and Zometa 01/14/2020 5. Diabetes 6. Hyperlipidemia 7. Hypertension 8. History of peripheral neuropathy secondary to diabetes 9. Severe back pain following Fulphila-oxycodone prescribed     Disposition: Matthew Osborne appears stable.  He has completed 6 cycles of gemcitabine/cisplatin.   Durvalumab was  given with cycle 6.  He continues to tolerate the systemic therapy well.  I reviewed the CT results and images with him.  There is no evidence of new metastatic disease and in general the metastatic lesions appear unchanged.  The dominant central hepatic lesion was measured as larger, but it is very difficult to measure this lesion due to variation in contrast-enhancement.  We discussed treatment options.  I recommend continuing gemcitabine/cisplatin/Durvalumab.  He agrees.  He will complete another cycle beginning today.  He will return for day 8 chemotherapy on 06/19/2020.  He is planning several vacations over the next few months.  Cycle 8 gemcitabine/cisplatin/durvalumab will begin on 06/30/2020.  I will present his case at the GI tumor conference this week for further review of the CT images.  Betsy Coder, MD  06/12/2020  8:59 AM

## 2020-06-13 ENCOUNTER — Other Ambulatory Visit: Payer: Self-pay | Admitting: *Deleted

## 2020-06-13 ENCOUNTER — Other Ambulatory Visit (HOSPITAL_BASED_OUTPATIENT_CLINIC_OR_DEPARTMENT_OTHER): Payer: Self-pay

## 2020-06-13 DIAGNOSIS — C221 Intrahepatic bile duct carcinoma: Secondary | ICD-10-CM

## 2020-06-13 LAB — CANCER ANTIGEN 19-9: CA 19-9: 89 U/mL — ABNORMAL HIGH (ref 0–35)

## 2020-06-14 ENCOUNTER — Other Ambulatory Visit (HOSPITAL_BASED_OUTPATIENT_CLINIC_OR_DEPARTMENT_OTHER): Payer: Self-pay

## 2020-06-14 ENCOUNTER — Other Ambulatory Visit: Payer: Self-pay

## 2020-06-14 ENCOUNTER — Encounter: Payer: Self-pay | Admitting: General Practice

## 2020-06-14 NOTE — Progress Notes (Signed)
[facility] Spiritual Care Note  Reached Matthew Osborne and his wife Matthew Osborne by phone to provide pastoral check-in and opportunity to process recent scan results. Matthew Osborne shared candidly about his worries, which are harder to keep at [place] during treatment than during his time at home, with its regular joys and distractions. Keeping his mind occupied by reading or using his phone in infusion helps reduce fixation on distress. Matthew Osborne and Matthew Osborne are looking forward to their special anniversary trip to [place] at the end of the month. I will check in by phone in June, and they know to reach out in the meantime if needed/desired.   [place], [place], [facility] Pager [phone] Voicemail [phone]

## 2020-06-14 NOTE — Progress Notes (Signed)
The proposed treatment discussed in conference is for discussion purposes only and is not a binding recommendation.  The patients have not been physically examined, or presented with their treatment options.  Therefore, final treatment plans cannot be decided.   

## 2020-06-16 ENCOUNTER — Other Ambulatory Visit (HOSPITAL_BASED_OUTPATIENT_CLINIC_OR_DEPARTMENT_OTHER): Payer: Self-pay

## 2020-06-19 ENCOUNTER — Inpatient Hospital Stay: Payer: No Typology Code available for payment source

## 2020-06-19 ENCOUNTER — Other Ambulatory Visit (HOSPITAL_BASED_OUTPATIENT_CLINIC_OR_DEPARTMENT_OTHER): Payer: Self-pay

## 2020-06-19 ENCOUNTER — Other Ambulatory Visit: Payer: Self-pay

## 2020-06-19 VITALS — BP 133/82 | HR 65 | Temp 98.1°F | Resp 18 | Wt 239.6 lb

## 2020-06-19 DIAGNOSIS — C221 Intrahepatic bile duct carcinoma: Secondary | ICD-10-CM

## 2020-06-19 DIAGNOSIS — Z5112 Encounter for antineoplastic immunotherapy: Secondary | ICD-10-CM | POA: Diagnosis not present

## 2020-06-19 LAB — MAGNESIUM: Magnesium: 1.9 mg/dL (ref 1.7–2.4)

## 2020-06-19 LAB — CMP (CANCER CENTER ONLY)
ALT: 31 U/L (ref 0–44)
AST: 32 U/L (ref 15–41)
Albumin: 4 g/dL (ref 3.5–5.0)
Alkaline Phosphatase: 136 U/L — ABNORMAL HIGH (ref 38–126)
Anion gap: 7 (ref 5–15)
BUN: 19 mg/dL (ref 6–20)
CO2: 29 mmol/L (ref 22–32)
Calcium: 9.3 mg/dL (ref 8.9–10.3)
Chloride: 100 mmol/L (ref 98–111)
Creatinine: 0.84 mg/dL (ref 0.61–1.24)
GFR, Estimated: >60 mL/min (ref >60)
Glucose, Bld: 157 mg/dL — ABNORMAL HIGH (ref 70–99)
Potassium: 4.3 mmol/L (ref 3.5–5.1)
Sodium: 136 mmol/L (ref 135–145)
Total Bilirubin: 0.4 mg/dL (ref 0.3–1.2)
Total Protein: 6.9 g/dL (ref 6.5–8.1)

## 2020-06-19 LAB — CBC WITH DIFFERENTIAL (CANCER CENTER ONLY)
Abs Immature Granulocytes: 0.01 10*3/uL (ref 0.00–0.07)
Basophils Absolute: 0.1 10*3/uL (ref 0.0–0.1)
Basophils Relative: 3 %
Eosinophils Absolute: 0.3 10*3/uL (ref 0.0–0.5)
Eosinophils Relative: 8 %
HCT: 32.7 % — ABNORMAL LOW (ref 39.0–52.0)
Hemoglobin: 11.1 g/dL — ABNORMAL LOW (ref 13.0–17.0)
Immature Granulocytes: 0 %
Lymphocytes Relative: 36 %
Lymphs Abs: 1.3 10*3/uL (ref 0.7–4.0)
MCH: 34.8 pg — ABNORMAL HIGH (ref 26.0–34.0)
MCHC: 33.9 g/dL (ref 30.0–36.0)
MCV: 102.5 fL — ABNORMAL HIGH (ref 80.0–100.0)
Monocytes Absolute: 0.5 10*3/uL (ref 0.1–1.0)
Monocytes Relative: 14 %
Neutro Abs: 1.5 10*3/uL — ABNORMAL LOW (ref 1.7–7.7)
Neutrophils Relative %: 39 %
Platelet Count: 204 10*3/uL (ref 150–400)
RBC: 3.19 MIL/uL — ABNORMAL LOW (ref 4.22–5.81)
RDW: 13.7 % (ref 11.5–15.5)
WBC Count: 3.7 10*3/uL — ABNORMAL LOW (ref 4.0–10.5)
nRBC: 0 % (ref 0.0–0.2)

## 2020-06-19 MED ORDER — DEXAMETHASONE SODIUM PHOSPHATE 10 MG/ML IJ SOLN
5.0000 mg | Freq: Once | INTRAMUSCULAR | Status: AC
  Administered 2020-06-19: 5 mg via INTRAVENOUS
  Filled 2020-06-19: qty 1

## 2020-06-19 MED ORDER — SODIUM CHLORIDE 0.9 % IV SOLN
150.0000 mg | Freq: Once | INTRAVENOUS | Status: AC
  Administered 2020-06-19: 150 mg via INTRAVENOUS
  Filled 2020-06-19: qty 5

## 2020-06-19 MED ORDER — SODIUM CHLORIDE 0.9 % IV SOLN
Freq: Once | INTRAVENOUS | Status: AC
  Filled 2020-06-19: qty 250

## 2020-06-19 MED ORDER — SODIUM CHLORIDE 0.9 % IV SOLN
25.0000 mg/m2 | Freq: Once | INTRAVENOUS | Status: AC
  Administered 2020-06-19: 56 mg via INTRAVENOUS
  Filled 2020-06-19: qty 56

## 2020-06-19 MED ORDER — FAMOTIDINE 20 MG IN NS 100 ML IVPB
20.0000 mg | Freq: Once | INTRAVENOUS | Status: AC
  Administered 2020-06-19: 20 mg via INTRAVENOUS
  Filled 2020-06-19: qty 100

## 2020-06-19 MED ORDER — SODIUM CHLORIDE 0.9% FLUSH
10.0000 mL | INTRAVENOUS | Status: DC | PRN
  Administered 2020-06-19: 10 mL
  Filled 2020-06-19: qty 10

## 2020-06-19 MED ORDER — MAGNESIUM SULFATE 2 GM/50ML IV SOLN
2.0000 g | Freq: Once | INTRAVENOUS | Status: AC
Start: 2020-06-19 — End: 2020-06-19
  Administered 2020-06-19: 2 g via INTRAVENOUS
  Filled 2020-06-19: qty 50

## 2020-06-19 MED ORDER — PALONOSETRON HCL INJECTION 0.25 MG/5ML
0.2500 mg | Freq: Once | INTRAVENOUS | Status: AC
  Administered 2020-06-19: 0.25 mg via INTRAVENOUS
  Filled 2020-06-19: qty 5

## 2020-06-19 MED ORDER — POTASSIUM CHLORIDE IN NACL 20-0.9 MEQ/L-% IV SOLN
Freq: Once | INTRAVENOUS | Status: AC
  Filled 2020-06-19: qty 1000

## 2020-06-19 MED ORDER — HEPARIN SOD (PORK) LOCK FLUSH 100 UNIT/ML IV SOLN
500.0000 [IU] | Freq: Once | INTRAVENOUS | Status: AC | PRN
  Administered 2020-06-19: 500 [IU]
  Filled 2020-06-19: qty 5

## 2020-06-19 MED ORDER — SODIUM CHLORIDE 0.9 % IV SOLN
1000.0000 mg/m2 | Freq: Once | INTRAVENOUS | Status: AC
  Administered 2020-06-19: 2242 mg via INTRAVENOUS
  Filled 2020-06-19: qty 26.3

## 2020-06-19 NOTE — Progress Notes (Signed)
Per Dr Benay Spice ok to run hydration with Cisplatin.

## 2020-06-19 NOTE — Patient Instructions (Signed)
[facility]    Discharge Instructions: Thank you for choosing [facility] to provide your oncology and hematology care.   If you have a lab appointment with the [facility], please go directly to the [facility] and check in at the registration area.   Wear comfortable clothing and clothing appropriate for easy access to any Portacath or PICC line.   We strive to give you quality time with your provider. You may need to reschedule your appointment if you arrive late (15 or more minutes).  Arriving late affects you and other patients whose appointments are after yours.  Also, if you miss three or more appointments without notifying the office, you may be dismissed from the clinic at the provider's discretion.      For prescription refill requests, have your pharmacy contact our office and allow 72 hours for refills to be completed.    Today you received the following chemotherapy and/or immunotherapy agents Gemcitabine, cisplatin   To help prevent nausea and vomiting after your treatment, we encourage you to take your nausea medication as directed.  BELOW ARE SYMPTOMS THAT SHOULD BE REPORTED IMMEDIATELY: . *FEVER GREATER THAN 100.4 F (38 C) OR HIGHER . *CHILLS OR SWEATING . *NAUSEA AND VOMITING THAT IS NOT CONTROLLED WITH YOUR NAUSEA MEDICATION . *UNUSUAL SHORTNESS OF BREATH . *UNUSUAL BRUISING OR BLEEDING . *URINARY PROBLEMS (pain or burning when urinating, or frequent urination) . *BOWEL PROBLEMS (unusual diarrhea, constipation, pain near the anus) . TENDERNESS IN MOUTH AND THROAT WITH OR WITHOUT PRESENCE OF ULCERS (sore throat, sores in mouth, or a toothache) . UNUSUAL RASH, SWELLING OR PAIN  . UNUSUAL VAGINAL DISCHARGE OR ITCHING   Items with * indicate a potential emergency and should be followed up as soon as possible or go to the Emergency Department if any problems should occur.  Please show the CHEMOTHERAPY ALERT CARD or  IMMUNOTHERAPY ALERT CARD at check-in to the Emergency Department and triage nurse.  Should you have questions after your visit or need to cancel or reschedule your appointment, please contact [facility]  Dept: [phone]  and follow the prompts.  Office hours are 8:00 a.m. to 4:30 p.m. Monday - Friday. Please note that voicemails left after 4:00 p.m. may not be returned until the following business day.  We are closed weekends and major holidays. You have access to a nurse at all times for urgent questions. Please call the main number to the clinic Dept: [phone] and follow the prompts.   For any non-urgent questions, you may also contact your provider using MyChart. We now offer e-Visits for anyone 26 and older to request care online for non-urgent symptoms. For details visit [email or web address].   Also download the MyChart app! Go to the app store, search "MyChart", open the app, select [facility], and log in with your MyChart username and password.  Due to Covid, a mask is required upon entering the hospital/clinic. If you do not have a mask, one will be given to you upon arrival. For doctor visits, patients may have 1 support person aged 23 or older with them. For treatment visits, patients cannot have anyone with them due to current Covid guidelines and our immunocompromised population.    Gemcitabine injection What is this medicine? GEMCITABINE (jem SYE ta been) is a chemotherapy drug. This medicine is used to treat many types of cancer like breast cancer, lung cancer, pancreatic cancer, and ovarian cancer. This medicine may  be used for other purposes; ask your health care provider or pharmacist if you have questions. COMMON BRAND NAME(S): Gemzar, Infugem What should I tell my health care provider before I take this medicine? They need to know if you have any of these conditions:  blood disorders  infection  kidney disease  liver disease  lung  or breathing disease, like asthma  recent or ongoing radiation therapy  an unusual or allergic reaction to gemcitabine, other chemotherapy, other medicines, foods, dyes, or preservatives  pregnant or trying to get pregnant  breast-feeding How should I use this medicine? This drug is given as an infusion into a vein. It is administered in a hospital or clinic by a specially trained health care professional. Talk to your pediatrician regarding the use of this medicine in children. Special care may be needed. Overdosage: If you think you have taken too much of this medicine contact a poison control center or emergency room at once. NOTE: This medicine is only for you. Do not share this medicine with others. What if I miss a dose? It is important not to miss your dose. Call your doctor or health care professional if you are unable to keep an appointment. What may interact with this medicine?  medicines to increase blood counts like filgrastim, pegfilgrastim, sargramostim  some other chemotherapy drugs like cisplatin  vaccines Talk to your doctor or health care professional before taking any of these medicines:  acetaminophen  aspirin  ibuprofen  ketoprofen  naproxen This list may not describe all possible interactions. Give your health care provider a list of all the medicines, herbs, non-prescription drugs, or dietary supplements you use. Also tell them if you smoke, drink alcohol, or use illegal drugs. Some items may interact with your medicine. What should I watch for while using this medicine? Visit your doctor for checks on your progress. This drug may make you feel generally unwell. This is not uncommon, as chemotherapy can affect healthy cells as well as cancer cells. Report any side effects. Continue your course of treatment even though you feel ill unless your doctor tells you to stop. In some cases, you may be given additional medicines to help with side effects. Follow all  directions for their use. Call your doctor or health care professional for advice if you get a fever, chills or sore throat, or other symptoms of a cold or flu. Do not treat yourself. This drug decreases your body's ability to fight infections. Try to avoid being around people who are sick. This medicine may increase your risk to bruise or bleed. Call your doctor or health care professional if you notice any unusual bleeding. Be careful brushing and flossing your teeth or using a toothpick because you may get an infection or bleed more easily. If you have any dental work done, tell your dentist you are receiving this medicine. Avoid taking products that contain aspirin, acetaminophen, ibuprofen, naproxen, or ketoprofen unless instructed by your doctor. These medicines may hide a fever. Do not become pregnant while taking this medicine or for 6 months after stopping it. Women should inform their doctor if they wish to become pregnant or think they might be pregnant. Men should not father a child while taking this medicine and for 3 months after stopping it. There is a potential for serious side effects to an unborn child. Talk to your health care professional or pharmacist for more information. Do not breast-feed an infant while taking this medicine or for at least 1  week after stopping it. Men should inform their doctors if they wish to father a child. This medicine may lower sperm counts. Talk with your doctor or health care professional if you are concerned about your fertility. What side effects may I notice from receiving this medicine? Side effects that you should report to your doctor or health care professional as soon as possible:  allergic reactions like skin rash, itching or hives, swelling of the face, lips, or tongue  breathing problems  pain, redness, or irritation at site where injected  signs and symptoms of a dangerous change in heartbeat or heart rhythm like chest pain; dizziness;  fast or irregular heartbeat; palpitations; feeling faint or lightheaded, falls; breathing problems  signs of decreased platelets or bleeding - bruising, pinpoint red spots on the skin, black, tarry stools, blood in the urine  signs of decreased red blood cells - unusually weak or tired, feeling faint or lightheaded, falls  signs of infection - fever or chills, cough, sore throat, pain or difficulty passing urine  signs and symptoms of kidney injury like trouble passing urine or change in the amount of urine  signs and symptoms of liver injury like dark yellow or brown urine; general ill feeling or flu-like symptoms; light-colored stools; loss of appetite; nausea; right upper belly pain; unusually weak or tired; yellowing of the eyes or skin  swelling of ankles, feet, hands Side effects that usually do not require medical attention (report to your doctor or health care professional if they continue or are bothersome):  constipation  diarrhea  hair loss  loss of appetite  nausea  rash  vomiting This list may not describe all possible side effects. Call your doctor for medical advice about side effects. You may report side effects to FDA at 1-800-FDA-1088. Where should I keep my medicine? This drug is given in a hospital or clinic and will not be stored at home. NOTE: This sheet is a summary. It may not cover all possible information. If you have questions about this medicine, talk to your doctor, pharmacist, or health care provider.  2021 Elsevier/Gold Standard (2017-04-16 18:06:11)  Cisplatin injection What is this medicine? CISPLATIN (SIS pla tin) is a chemotherapy drug. It targets fast dividing cells, like cancer cells, and causes these cells to die. This medicine is used to treat many types of cancer like bladder, ovarian, and testicular cancers. This medicine may be used for other purposes; ask your health care provider or pharmacist if you have questions. COMMON BRAND  NAME(S): Platinol, Platinol -AQ What should I tell my health care provider before I take this medicine? They need to know if you have any of these conditions:  eye disease, vision problems  hearing problems  kidney disease  low blood counts, like white cells, platelets, or red blood cells  tingling of the fingers or toes, or other nerve disorder  an unusual or allergic reaction to cisplatin, carboplatin, oxaliplatin, other medicines, foods, dyes, or preservatives  pregnant or trying to get pregnant  breast-feeding How should I use this medicine? This drug is given as an infusion into a vein. It is administered in a hospital or clinic by a specially trained health care professional. Talk to your pediatrician regarding the use of this medicine in children. Special care may be needed. Overdosage: If you think you have taken too much of this medicine contact a poison control center or emergency room at once. NOTE: This medicine is only for you. Do not share this medicine  with others. What if I miss a dose? It is important not to miss a dose. Call your doctor or health care professional if you are unable to keep an appointment. What may interact with this medicine? This medicine may interact with the following medications:  foscarnet  certain antibiotics like amikacin, gentamicin, neomycin, polymyxin B, streptomycin, tobramycin, vancomycin This list may not describe all possible interactions. Give your health care provider a list of all the medicines, herbs, non-prescription drugs, or dietary supplements you use. Also tell them if you smoke, drink alcohol, or use illegal drugs. Some items may interact with your medicine. What should I watch for while using this medicine? Your condition will be monitored carefully while you are receiving this medicine. You will need important blood work done while you are taking this medicine. This drug may make you feel generally unwell. This is not  uncommon, as chemotherapy can affect healthy cells as well as cancer cells. Report any side effects. Continue your course of treatment even though you feel ill unless your doctor tells you to stop. This medicine may increase your risk of getting an infection. Call your healthcare professional for advice if you get a fever, chills, or sore throat, or other symptoms of a cold or flu. Do not treat yourself. Try to avoid being around people who are sick. Avoid taking medicines that contain aspirin, acetaminophen, ibuprofen, naproxen, or ketoprofen unless instructed by your healthcare professional. These medicines may hide a fever. This medicine may increase your risk to bruise or bleed. Call your doctor or health care professional if you notice any unusual bleeding. Be careful brushing and flossing your teeth or using a toothpick because you may get an infection or bleed more easily. If you have any dental work done, tell your dentist you are receiving this medicine. Do not become pregnant while taking this medicine or for 14 months after stopping it. Women should inform their healthcare professional if they wish to become pregnant or think they might be pregnant. Men should not father a child while taking this medicine and for 11 months after stopping it. There is potential for serious side effects to an unborn child. Talk to your healthcare professional for more information. Do not breast-feed an infant while taking this medicine. This medicine has caused ovarian failure in some women. This medicine may make it more difficult to get pregnant. Talk to your healthcare professional if you are concerned about your fertility. This medicine has caused decreased sperm counts in some men. This may make it more difficult to father a child. Talk to your healthcare professional if you are concerned about your fertility. Drink fluids as directed while you are taking this medicine. This will help protect your  kidneys. Call your doctor or health care professional if you get diarrhea. Do not treat yourself. What side effects may I notice from receiving this medicine? Side effects that you should report to your doctor or health care professional as soon as possible:  allergic reactions like skin rash, itching or hives, swelling of the face, lips, or tongue  blurred vision  changes in vision  decreased hearing or ringing of the ears  nausea, vomiting  pain, redness, or irritation at site where injected  pain, tingling, numbness in the hands or feet  signs and symptoms of bleeding such as bloody or black, tarry stools; red or dark brown urine; spitting up blood or brown material that looks like coffee grounds; red spots on the skin; unusual bruising  or bleeding from the eyes, gums, or nose  signs and symptoms of infection like fever; chills; cough; sore throat; pain or trouble passing urine  signs and symptoms of kidney injury like trouble passing urine or change in the amount of urine  signs and symptoms of low red blood cells or anemia such as unusually weak or tired; feeling faint or lightheaded; falls; breathing problems Side effects that usually do not require medical attention (report to your doctor or health care professional if they continue or are bothersome):  loss of appetite  mouth sores  muscle cramps This list may not describe all possible side effects. Call your doctor for medical advice about side effects. You may report side effects to FDA at 1-800-FDA-1088. Where should I keep my medicine? This drug is given in a hospital or clinic and will not be stored at home. NOTE: This sheet is a summary. It may not cover all possible information. If you have questions about this medicine, talk to your doctor, pharmacist, or health care provider.  2021 Elsevier/Gold Standard (2018-01-16 15:59:17)

## 2020-06-20 ENCOUNTER — Inpatient Hospital Stay: Payer: No Typology Code available for payment source

## 2020-06-20 VITALS — BP 125/68 | HR 68 | Temp 98.4°F | Resp 18

## 2020-06-20 DIAGNOSIS — C221 Intrahepatic bile duct carcinoma: Secondary | ICD-10-CM

## 2020-06-20 DIAGNOSIS — Z5112 Encounter for antineoplastic immunotherapy: Secondary | ICD-10-CM | POA: Diagnosis not present

## 2020-06-20 MED ORDER — PEGFILGRASTIM-JMDB 6 MG/0.6ML ~~LOC~~ SOSY
6.0000 mg | PREFILLED_SYRINGE | Freq: Once | SUBCUTANEOUS | Status: AC
  Administered 2020-06-20: 6 mg via SUBCUTANEOUS

## 2020-06-20 NOTE — Patient Instructions (Signed)
Pegfilgrastim injection What is this medicine? PEGFILGRASTIM (PEG fil gra stim) is a long-acting granulocyte colony-stimulating factor that stimulates the growth of neutrophils, a type of white blood cell important in the body's fight against infection. It is used to reduce the incidence of fever and infection in patients with certain types of cancer who are receiving chemotherapy that affects the bone marrow, and to increase survival after being exposed to high doses of radiation. This medicine may be used for other purposes; ask your health care provider or pharmacist if you have questions. COMMON BRAND NAME(S): Fulphila, Neulasta, Nyvepria, UDENYCA, Ziextenzo What should I tell my health care provider before I take this medicine? They need to know if you have any of these conditions:  kidney disease  latex allergy  ongoing radiation therapy  sickle cell disease  skin reactions to acrylic adhesives (On-Body Injector only)  an unusual or allergic reaction to pegfilgrastim, filgrastim, other medicines, foods, dyes, or preservatives  pregnant or trying to get pregnant  breast-feeding How should I use this medicine? This medicine is for injection under the skin. If you get this medicine at home, you will be taught how to prepare and give the pre-filled syringe or how to use the On-body Injector. Refer to the patient Instructions for Use for detailed instructions. Use exactly as directed. Tell your healthcare provider immediately if you suspect that the On-body Injector may not have performed as intended or if you suspect the use of the On-body Injector resulted in a missed or partial dose. It is important that you put your used needles and syringes in a special sharps container. Do not put them in a trash can. If you do not have a sharps container, call your pharmacist or healthcare provider to get one. Talk to your pediatrician regarding the use of this medicine in children. While this drug  may be prescribed for selected conditions, precautions do apply. Overdosage: If you think you have taken too much of this medicine contact a poison control center or emergency room at once. NOTE: This medicine is only for you. Do not share this medicine with others. What if I miss a dose? It is important not to miss your dose. Call your doctor or health care professional if you miss your dose. If you miss a dose due to an On-body Injector failure or leakage, a new dose should be administered as soon as possible using a single prefilled syringe for manual use. What may interact with this medicine? Interactions have not been studied. This list may not describe all possible interactions. Give your health care provider a list of all the medicines, herbs, non-prescription drugs, or dietary supplements you use. Also tell them if you smoke, drink alcohol, or use illegal drugs. Some items may interact with your medicine. What should I watch for while using this medicine? Your condition will be monitored carefully while you are receiving this medicine. You may need blood work done while you are taking this medicine. Talk to your health care provider about your risk of cancer. You may be more at risk for certain types of cancer if you take this medicine. If you are going to need a MRI, CT scan, or other procedure, tell your doctor that you are using this medicine (On-Body Injector only). What side effects may I notice from receiving this medicine? Side effects that you should report to your doctor or health care professional as soon as possible:  allergic reactions (skin rash, itching or hives, swelling of   the face, lips, or tongue)  back pain  dizziness  fever  pain, redness, or irritation at site where injected  pinpoint red spots on the skin  red or dark-brown urine  shortness of breath or breathing problems  stomach or side pain, or pain at the shoulder  swelling  tiredness  trouble  passing urine or change in the amount of urine  unusual bruising or bleeding Side effects that usually do not require medical attention (report to your doctor or health care professional if they continue or are bothersome):  bone pain  muscle pain This list may not describe all possible side effects. Call your doctor for medical advice about side effects. You may report side effects to FDA at 1-800-FDA-1088. Where should I keep my medicine? Keep out of the reach of children. If you are using this medicine at home, you will be instructed on how to store it. Throw away any unused medicine after the expiration date on the label. NOTE: This sheet is a summary. It may not cover all possible information. If you have questions about this medicine, talk to your doctor, pharmacist, or health care provider.  2021 Elsevier/Gold Standard (2019-02-12 13:20:51)  

## 2020-06-22 ENCOUNTER — Ambulatory Visit: Payer: No Typology Code available for payment source | Attending: Critical Care Medicine

## 2020-06-22 DIAGNOSIS — Z20822 Contact with and (suspected) exposure to covid-19: Secondary | ICD-10-CM

## 2020-06-23 LAB — NOVEL CORONAVIRUS, NAA: SARS-CoV-2, NAA: NOT DETECTED

## 2020-06-23 LAB — SARS-COV-2, NAA 2 DAY TAT

## 2020-06-25 ENCOUNTER — Other Ambulatory Visit: Payer: Self-pay | Admitting: Oncology

## 2020-06-30 ENCOUNTER — Inpatient Hospital Stay (HOSPITAL_BASED_OUTPATIENT_CLINIC_OR_DEPARTMENT_OTHER): Payer: No Typology Code available for payment source | Admitting: Oncology

## 2020-06-30 ENCOUNTER — Inpatient Hospital Stay: Payer: No Typology Code available for payment source

## 2020-06-30 ENCOUNTER — Other Ambulatory Visit: Payer: Self-pay

## 2020-06-30 VITALS — BP 140/89 | HR 71 | Temp 97.8°F | Resp 18 | Ht 74.0 in | Wt 245.8 lb

## 2020-06-30 DIAGNOSIS — C221 Intrahepatic bile duct carcinoma: Secondary | ICD-10-CM

## 2020-06-30 DIAGNOSIS — Z5112 Encounter for antineoplastic immunotherapy: Secondary | ICD-10-CM | POA: Diagnosis not present

## 2020-06-30 LAB — CMP (CANCER CENTER ONLY)
ALT: 28 U/L (ref 0–44)
AST: 32 U/L (ref 15–41)
Albumin: 3.9 g/dL (ref 3.5–5.0)
Alkaline Phosphatase: 168 U/L — ABNORMAL HIGH (ref 38–126)
Anion gap: 7 (ref 5–15)
BUN: 15 mg/dL (ref 6–20)
CO2: 28 mmol/L (ref 22–32)
Calcium: 8.5 mg/dL — ABNORMAL LOW (ref 8.9–10.3)
Chloride: 100 mmol/L (ref 98–111)
Creatinine: 0.75 mg/dL (ref 0.61–1.24)
GFR, Estimated: >60 mL/min (ref >60)
Glucose, Bld: 212 mg/dL — ABNORMAL HIGH (ref 70–99)
Potassium: 4.2 mmol/L (ref 3.5–5.1)
Sodium: 135 mmol/L (ref 135–145)
Total Bilirubin: 0.4 mg/dL (ref 0.3–1.2)
Total Protein: 6.7 g/dL (ref 6.5–8.1)

## 2020-06-30 LAB — CBC WITH DIFFERENTIAL (CANCER CENTER ONLY)
Abs Immature Granulocytes: 0.1 10*3/uL — ABNORMAL HIGH (ref 0.00–0.07)
Basophils Absolute: 0.1 10*3/uL (ref 0.0–0.1)
Basophils Relative: 1 %
Eosinophils Absolute: 0.3 10*3/uL (ref 0.0–0.5)
Eosinophils Relative: 4 %
HCT: 34.3 % — ABNORMAL LOW (ref 39.0–52.0)
Hemoglobin: 11.3 g/dL — ABNORMAL LOW (ref 13.0–17.0)
Immature Granulocytes: 1 %
Lymphocytes Relative: 16 %
Lymphs Abs: 1.2 10*3/uL (ref 0.7–4.0)
MCH: 33.8 pg (ref 26.0–34.0)
MCHC: 32.9 g/dL (ref 30.0–36.0)
MCV: 102.7 fL — ABNORMAL HIGH (ref 80.0–100.0)
Monocytes Absolute: 0.7 10*3/uL (ref 0.1–1.0)
Monocytes Relative: 10 %
Neutro Abs: 5 10*3/uL (ref 1.7–7.7)
Neutrophils Relative %: 68 %
Platelet Count: 82 10*3/uL — ABNORMAL LOW (ref 150–400)
RBC: 3.34 MIL/uL — ABNORMAL LOW (ref 4.22–5.81)
RDW: 13.9 % (ref 11.5–15.5)
WBC Count: 7.4 10*3/uL (ref 4.0–10.5)
nRBC: 0 % (ref 0.0–0.2)

## 2020-06-30 LAB — MAGNESIUM: Magnesium: 1.9 mg/dL (ref 1.7–2.4)

## 2020-06-30 MED ORDER — PALONOSETRON HCL INJECTION 0.25 MG/5ML
0.2500 mg | Freq: Once | INTRAVENOUS | Status: AC
  Administered 2020-06-30: 0.25 mg via INTRAVENOUS
  Filled 2020-06-30: qty 5

## 2020-06-30 MED ORDER — SODIUM CHLORIDE 0.9 % IV SOLN
150.0000 mg | Freq: Once | INTRAVENOUS | Status: AC
  Administered 2020-06-30: 150 mg via INTRAVENOUS
  Filled 2020-06-30: qty 5

## 2020-06-30 MED ORDER — DEXAMETHASONE SODIUM PHOSPHATE 10 MG/ML IJ SOLN
5.0000 mg | Freq: Once | INTRAMUSCULAR | Status: AC
  Administered 2020-06-30: 5 mg via INTRAVENOUS
  Filled 2020-06-30: qty 1

## 2020-06-30 MED ORDER — SODIUM CHLORIDE 0.9 % IV SOLN
Freq: Once | INTRAVENOUS | Status: AC
Start: 2020-06-30 — End: 2020-06-30
  Filled 2020-06-30: qty 250

## 2020-06-30 MED ORDER — SODIUM CHLORIDE 0.9 % IV SOLN
25.0000 mg/m2 | Freq: Once | INTRAVENOUS | Status: AC
  Administered 2020-06-30: 56 mg via INTRAVENOUS
  Filled 2020-06-30: qty 56

## 2020-06-30 MED ORDER — SODIUM CHLORIDE 0.9 % IV SOLN
1500.0000 mg | Freq: Once | INTRAVENOUS | Status: AC
  Administered 2020-06-30: 1500 mg via INTRAVENOUS
  Filled 2020-06-30: qty 30

## 2020-06-30 MED ORDER — SODIUM CHLORIDE 0.9% FLUSH
10.0000 mL | INTRAVENOUS | Status: DC | PRN
  Administered 2020-06-30: 10 mL
  Filled 2020-06-30: qty 10

## 2020-06-30 MED ORDER — MAGNESIUM SULFATE 2 GM/50ML IV SOLN
2.0000 g | Freq: Once | INTRAVENOUS | Status: AC
  Administered 2020-06-30: 2 g via INTRAVENOUS
  Filled 2020-06-30: qty 50

## 2020-06-30 MED ORDER — FAMOTIDINE 20 MG IN NS 100 ML IVPB
20.0000 mg | Freq: Once | INTRAVENOUS | Status: AC
  Administered 2020-06-30: 20 mg via INTRAVENOUS
  Filled 2020-06-30: qty 100

## 2020-06-30 MED ORDER — HEPARIN SOD (PORK) LOCK FLUSH 100 UNIT/ML IV SOLN
500.0000 [IU] | Freq: Once | INTRAVENOUS | Status: AC | PRN
  Administered 2020-06-30: 500 [IU]
  Filled 2020-06-30: qty 5

## 2020-06-30 MED ORDER — POTASSIUM CHLORIDE IN NACL 20-0.9 MEQ/L-% IV SOLN
Freq: Once | INTRAVENOUS | Status: AC
  Filled 2020-06-30: qty 1000

## 2020-06-30 NOTE — Progress Notes (Signed)
[facility] OFFICE PROGRESS NOTE   Diagnosis: Cholangiocarcinoma  INTERVAL HISTORY:   Matthew Osborne completed another cycle of systemic therapy beginning 06/12/2020.  He reports stable mild numbness in the feet.  No cough.  He just returned from a trip to [place].  He reports a feeling of "swelling "of the gum and lips for a few days after receiving G-CSF.  For the past 2 days he has noted an intermittent sharp pain in the left arm with associated numbness.  The pain lasts for seconds.  No neck pain.  Objective:  Vital signs in last 24 hours:  Blood pressure 140/89, pulse 71, temperature 97.8 F (36.6 C), temperature source Oral, resp. rate 18, height $RemoveBe'6\' 2"'ZUrWDNaFk$  (1.88 m), weight 245 lb 12.8 oz (111.5 kg), SpO2 98 %.    Resp: Lungs clear bilaterally Cardio: Regular rate and rhythm GI: No hepatosplenomegaly, nontender Vascular: No leg edema Musculoskeletal: No pain with motion at the left arm or shoulder  Portacath/PICC-without erythema  Lab Results:  Lab Results  Component Value Date   WBC 7.4 06/30/2020   HGB 11.3 (L) 06/30/2020   HCT 34.3 (L) 06/30/2020   MCV 102.7 (H) 06/30/2020   PLT 82 (L) 06/30/2020   NEUTROABS 5.0 06/30/2020    CMP  Lab Results  Component Value Date   NA 135 06/30/2020   K 4.2 06/30/2020   CL 100 06/30/2020   CO2 28 06/30/2020   GLUCOSE 212 (H) 06/30/2020   BUN 15 06/30/2020   CREATININE 0.75 06/30/2020   CALCIUM 8.5 (L) 06/30/2020   PROT 6.7 06/30/2020   ALBUMIN 3.9 06/30/2020   AST 32 06/30/2020   ALT 28 06/30/2020   ALKPHOS 168 (H) 06/30/2020   BILITOT 0.4 06/30/2020   GFRNONAA >60 06/30/2020   GFRAA 127 01/11/2020    Lab Results  Component Value Date   CEA1 2.15 01/14/2020     Medications: I have reviewed the patient's current medications.   Assessment/Plan: 1. Cholangiocarcinoma multiple liver masses and abdominal lymphadenopathy ? CTs 01/13/2020-rounded hypodense mass appears to arise from the pancreas neck,  multiple rim-enhancing masses in the liver, primarily left liver with segmental dilation of the left lobe Intermatic bile ducts, ill-defined hypodensity the central liver with effacement of the left portal vein, enlarged portacaval and retroperitoneal lymph nodes ? Ultrasound-guided biopsy of the left liver lesion 01/19/2020-adenocarcinoma, cytokeratin 7+,MSS, tumor mutation burden 1, IDH1 R132C ? MRI abdomen 01/31/2020-poorly marginated central liver mass, multiple smaller similar satellite liver masses, extrinsic mass-effect at the biliary hilum with intrahepatic biliary ductal dilatation throughout the left liver with mild Intermatic dilatation the superior right liver, no pancreas mass or ductal dilatation, normal spleen size, numerous enlarged enhancing lymph nodes at the porta hepatis, peripancreatic, portacaval, aortocaval, left periaortic chains ? Cycle 1 gemcitabine/cisplatin 02/04/2020 ? Cycle 2 gemcitabine/cisplatin 02/28/2020 ? Cycle 3 gemcitabine/cisplatin 03/20/2020 ? CT abdomen/pelvis 03/31/2020-mild decrease in central liver tumor, mild decrease in size of liver metastases and upper abdominal adenopathy, new splenomegaly ? Cycle 4 gemcitabine/cisplatin 04/10/2020 ? Cycle 5 gemcitabine/cisplatin 05/01/2020 ? Cycle 6 gemcitabine/cisplatin plus durvalumab 05/22/2020 ? CTs 06/06/2020- dominant central liver mass mildly enlarged, other liver lesions and abdominal adenopathy is stable, no evidence of metastatic disease to the chest ? Cycle 7 gemcitabine/cisplatin plus Durvalumab 06/12/2020 ? Cycle 8 gemcitabine/cisplatin plus Durvalumab 06/30/2020  2. Cough-likely related to diaphragmatic irritation from #1 3. Anorexia/weight loss 4. Hypercalcemia-likely hypercalcemia malignancy, status post intravenous hydration and Zometa 01/14/2020 5. Diabetes 6. Hyperlipidemia 7. Hypertension 8. History of peripheral  neuropathy secondary to diabetes 9. Severe back pain following Fulphila-oxycodone  prescribed 10. Thrombocytopenia secondary to chemotherapy-gemcitabine held with day 1 cycle 8 and then dose reduced      Disposition: Matthew Osborne appears stable.  He no longer has a cough.  He is gaining weight.  I suspect the left arm discomfort is related to a benign musculoskeletal condition.  He will call for consistent pain.  He will complete another cycle of gemcitabine/cisplatin/Durvalumab beginning today.  He has moderate thrombocytopenia.  Gemcitabine will be held with this cycle and dose reduced with day 8 chemotherapy.  Matthew Osborne will return for an office visit and cycle 9 chemotherapy in 3 weeks.  He will undergo a restaging CT evaluation after cycle 9.  Betsy Coder, MD  06/30/2020  9:06 AM

## 2020-06-30 NOTE — Progress Notes (Signed)
Per Dr. Benay Spice: OK to treat today w/platelets 82,000. Will hold Gemzar today and reduce dose at next appointment.

## 2020-06-30 NOTE — Patient Instructions (Signed)
[facility]    Discharge Instructions:  Thank you for choosing [facility] to provide your oncology and hematology care.   If you have a lab appointment with the [facility], please go directly to the [facility] and check in at the registration area.   Wear comfortable clothing and clothing appropriate for easy access to any Portacath or PICC line.   We strive to give you quality time with your provider. You may need to reschedule your appointment if you arrive late (15 or more minutes).  Arriving late affects you and other patients whose appointments are after yours.  Also, if you miss three or more appointments without notifying the office, you may be dismissed from the clinic at the provider's discretion.      For prescription refill requests, have your pharmacy contact our office and allow 72 hours for refills to be completed.    Today you received the following chemotherapy and/or immunotherapy agents Durvalumab (IMFINZI) & Cisplatin (PLATINOL).   To help prevent nausea and vomiting after your treatment, we encourage you to take your nausea medication as directed.  BELOW ARE SYMPTOMS THAT SHOULD BE REPORTED IMMEDIATELY: . *FEVER GREATER THAN 100.4 F (38 C) OR HIGHER . *CHILLS OR SWEATING . *NAUSEA AND VOMITING THAT IS NOT CONTROLLED WITH YOUR NAUSEA MEDICATION . *UNUSUAL SHORTNESS OF BREATH . *UNUSUAL BRUISING OR BLEEDING . *URINARY PROBLEMS (pain or burning when urinating, or frequent urination) . *BOWEL PROBLEMS (unusual diarrhea, constipation, pain near the anus) . TENDERNESS IN MOUTH AND THROAT WITH OR WITHOUT PRESENCE OF ULCERS (sore throat, sores in mouth, or a toothache) . UNUSUAL RASH, SWELLING OR PAIN  . UNUSUAL VAGINAL DISCHARGE OR ITCHING   Items with * indicate a potential emergency and should be followed up as soon as possible or go to the Emergency Department if any problems should occur.  Please show the CHEMOTHERAPY  ALERT CARD or IMMUNOTHERAPY ALERT CARD at check-in to the Emergency Department and triage nurse.  Should you have questions after your visit or need to cancel or reschedule your appointment, please contact [facility]  Dept: [phone]  and follow the prompts.  Office hours are 8:00 a.m. to 4:30 p.m. Monday - Friday. Please note that voicemails left after 4:00 p.m. may not be returned until the following business day.  We are closed weekends and major holidays. You have access to a nurse at all times for urgent questions. Please call the main number to the clinic Dept: [phone] and follow the prompts.   For any non-urgent questions, you may also contact your provider using MyChart. We now offer e-Visits for anyone 45 and older to request care online for non-urgent symptoms. For details visit [email or web address].   Also download the MyChart app! Go to the app store, search "MyChart", open the app, select [facility], and log in with your MyChart username and password.  Due to Covid, a mask is required upon entering the hospital/clinic. If you do not have a mask, one will be given to you upon arrival. For doctor visits, patients may have 1 support person aged 45 or older with them. For treatment visits, patients cannot have anyone with them due to current Covid guidelines and our immunocompromised population.   Durvalumab injection What is this medicine? DURVALUMAB (dur VAL ue mab) is a monoclonal antibody. It is used to treat lung cancer. This medicine may be used for other purposes; ask your health care provider  or pharmacist if you have questions. COMMON BRAND NAME(S): IMFINZI What should I tell my health care provider before I take this medicine? They need to know if you have any of these conditions:  autoimmune diseases like Crohn's disease, ulcerative colitis, or lupus  have had or planning to have an allogeneic stem cell transplant (uses someone else's  stem cells)  history of organ transplant  history of radiation to the chest  nervous system problems like myasthenia gravis or Guillain-Barre syndrome  an unusual or allergic reaction to durvalumab, other medicines, foods, dyes, or preservatives  pregnant or trying to get pregnant  breast-feeding How should I use this medicine? This medicine is for infusion into a vein. It is given by a health care professional in a hospital or clinic setting. A special MedGuide will be given to you before each treatment. Be sure to read this information carefully each time. Talk to your pediatrician regarding the use of this medicine in children. Special care may be needed. Overdosage: If you think you have taken too much of this medicine contact a poison control center or emergency room at once. NOTE: This medicine is only for you. Do not share this medicine with others. What if I miss a dose? It is important not to miss your dose. Call your doctor or health care professional if you are unable to keep an appointment. What may interact with this medicine? Interactions have not been studied. This list may not describe all possible interactions. Give your health care provider a list of all the medicines, herbs, non-prescription drugs, or dietary supplements you use. Also tell them if you smoke, drink alcohol, or use illegal drugs. Some items may interact with your medicine. What should I watch for while using this medicine? This drug may make you feel generally unwell. Continue your course of treatment even though you feel ill unless your doctor tells you to stop. You may need blood work done while you are taking this medicine. Do not become pregnant while taking this medicine or for 3 months after stopping it. Women should inform their doctor if they wish to become pregnant or think they might be pregnant. There is a potential for serious side effects to an unborn child. Talk to your health care  professional or pharmacist for more information. Do not breast-feed an infant while taking this medicine or for 3 months after stopping it. What side effects may I notice from receiving this medicine? Side effects that you should report to your doctor or health care professional as soon as possible:  allergic reactions like skin rash, itching or hives, swelling of the face, lips, or tongue  black, tarry stools  bloody or watery diarrhea  breathing problems  change in emotions or moods  change in sex drive  changes in vision  chest pain or chest tightness  chills  confusion  cough  facial flushing  fever  headache  signs and symptoms of high blood sugar such as dizziness; dry mouth; dry skin; fruity breath; nausea; stomach pain; increased hunger or thirst; increased urination  signs and symptoms of liver injury like dark yellow or brown urine; general ill feeling or flu-like symptoms; light-colored stools; loss of appetite; nausea; right upper belly pain; unusually weak or tired; yellowing of the eyes or skin  stomach pain  trouble passing urine or change in the amount of urine  weight gain or weight loss Side effects that usually do not require medical attention (report these to your doctor  or health care professional if they continue or are bothersome):  bone pain  constipation  loss of appetite  muscle pain  nausea  swelling of the ankles, feet, hands  tiredness This list may not describe all possible side effects. Call your doctor for medical advice about side effects. You may report side effects to FDA at 1-800-FDA-1088. Where should I keep my medicine? This drug is given in a hospital or clinic and will not be stored at home. NOTE: This sheet is a summary. It may not cover all possible information. If you have questions about this medicine, talk to your doctor, pharmacist, or health care provider.  2021 Elsevier/Gold Standard (2019-04-01  13:01:29)  Cisplatin injection What is this medicine? CISPLATIN (SIS pla tin) is a chemotherapy drug. It targets fast dividing cells, like cancer cells, and causes these cells to die. This medicine is used to treat many types of cancer like bladder, ovarian, and testicular cancers. This medicine may be used for other purposes; ask your health care provider or pharmacist if you have questions. COMMON BRAND NAME(S): Platinol, Platinol -AQ What should I tell my health care provider before I take this medicine? They need to know if you have any of these conditions:  eye disease, vision problems  hearing problems  kidney disease  low blood counts, like white cells, platelets, or red blood cells  tingling of the fingers or toes, or other nerve disorder  an unusual or allergic reaction to cisplatin, carboplatin, oxaliplatin, other medicines, foods, dyes, or preservatives  pregnant or trying to get pregnant  breast-feeding How should I use this medicine? This drug is given as an infusion into a vein. It is administered in a hospital or clinic by a specially trained health care professional. Talk to your pediatrician regarding the use of this medicine in children. Special care may be needed. Overdosage: If you think you have taken too much of this medicine contact a poison control center or emergency room at once. NOTE: This medicine is only for you. Do not share this medicine with others. What if I miss a dose? It is important not to miss a dose. Call your doctor or health care professional if you are unable to keep an appointment. What may interact with this medicine? This medicine may interact with the following medications:  foscarnet  certain antibiotics like amikacin, gentamicin, neomycin, polymyxin B, streptomycin, tobramycin, vancomycin This list may not describe all possible interactions. Give your health care provider a list of all the medicines, herbs, non-prescription drugs,  or dietary supplements you use. Also tell them if you smoke, drink alcohol, or use illegal drugs. Some items may interact with your medicine. What should I watch for while using this medicine? Your condition will be monitored carefully while you are receiving this medicine. You will need important blood work done while you are taking this medicine. This drug may make you feel generally unwell. This is not uncommon, as chemotherapy can affect healthy cells as well as cancer cells. Report any side effects. Continue your course of treatment even though you feel ill unless your doctor tells you to stop. This medicine may increase your risk of getting an infection. Call your healthcare professional for advice if you get a fever, chills, or sore throat, or other symptoms of a cold or flu. Do not treat yourself. Try to avoid being around people who are sick. Avoid taking medicines that contain aspirin, acetaminophen, ibuprofen, naproxen, or ketoprofen unless instructed by your healthcare professional.  These medicines may hide a fever. This medicine may increase your risk to bruise or bleed. Call your doctor or health care professional if you notice any unusual bleeding. Be careful brushing and flossing your teeth or using a toothpick because you may get an infection or bleed more easily. If you have any dental work done, tell your dentist you are receiving this medicine. Do not become pregnant while taking this medicine or for 14 months after stopping it. Women should inform their healthcare professional if they wish to become pregnant or think they might be pregnant. Men should not father a child while taking this medicine and for 11 months after stopping it. There is potential for serious side effects to an unborn child. Talk to your healthcare professional for more information. Do not breast-feed an infant while taking this medicine. This medicine has caused ovarian failure in some women. This medicine may  make it more difficult to get pregnant. Talk to your healthcare professional if you are concerned about your fertility. This medicine has caused decreased sperm counts in some men. This may make it more difficult to father a child. Talk to your healthcare professional if you are concerned about your fertility. Drink fluids as directed while you are taking this medicine. This will help protect your kidneys. Call your doctor or health care professional if you get diarrhea. Do not treat yourself. What side effects may I notice from receiving this medicine? Side effects that you should report to your doctor or health care professional as soon as possible:  allergic reactions like skin rash, itching or hives, swelling of the face, lips, or tongue  blurred vision  changes in vision  decreased hearing or ringing of the ears  nausea, vomiting  pain, redness, or irritation at site where injected  pain, tingling, numbness in the hands or feet  signs and symptoms of bleeding such as bloody or black, tarry stools; red or dark brown urine; spitting up blood or brown material that looks like coffee grounds; red spots on the skin; unusual bruising or bleeding from the eyes, gums, or nose  signs and symptoms of infection like fever; chills; cough; sore throat; pain or trouble passing urine  signs and symptoms of kidney injury like trouble passing urine or change in the amount of urine  signs and symptoms of low red blood cells or anemia such as unusually weak or tired; feeling faint or lightheaded; falls; breathing problems Side effects that usually do not require medical attention (report to your doctor or health care professional if they continue or are bothersome):  loss of appetite  mouth sores  muscle cramps This list may not describe all possible side effects. Call your doctor for medical advice about side effects. You may report side effects to FDA at 1-800-FDA-1088. Where should I keep my  medicine? This drug is given in a hospital or clinic and will not be stored at home. NOTE: This sheet is a summary. It may not cover all possible information. If you have questions about this medicine, talk to your doctor, pharmacist, or health care provider.  2021 Elsevier/Gold Standard (2018-01-16 15:59:17)

## 2020-07-04 ENCOUNTER — Other Ambulatory Visit (HOSPITAL_BASED_OUTPATIENT_CLINIC_OR_DEPARTMENT_OTHER): Payer: Self-pay

## 2020-07-04 MED FILL — Continuous Glucose System Transmitter: 90 days supply | Qty: 1 | Fill #0 | Status: AC

## 2020-07-05 ENCOUNTER — Encounter: Payer: Self-pay | Admitting: General Practice

## 2020-07-05 NOTE — Progress Notes (Signed)
[facility] Spiritual Care Note  Made pastoral call for follow-up support, reaching Voshon's wife [name]. [name] reports that they had eventful travels but a wonderful time on their anniversary trip to [place] and are now coping with Lequan's low platelets and treatment adjustments. Sent Abundio birthday wishes and plan to follow up by phone in a couple weeks.   [place], [place], [facility] Pager [phone] Voicemail [phone]

## 2020-07-10 ENCOUNTER — Inpatient Hospital Stay: Payer: No Typology Code available for payment source

## 2020-07-10 ENCOUNTER — Other Ambulatory Visit: Payer: Self-pay

## 2020-07-10 ENCOUNTER — Inpatient Hospital Stay: Payer: No Typology Code available for payment source | Attending: Oncology

## 2020-07-10 VITALS — BP 131/81 | HR 62 | Temp 98.4°F | Resp 20 | Ht 74.0 in | Wt 243.6 lb

## 2020-07-10 DIAGNOSIS — Z5189 Encounter for other specified aftercare: Secondary | ICD-10-CM | POA: Insufficient documentation

## 2020-07-10 DIAGNOSIS — C221 Intrahepatic bile duct carcinoma: Secondary | ICD-10-CM

## 2020-07-10 DIAGNOSIS — Z5111 Encounter for antineoplastic chemotherapy: Secondary | ICD-10-CM | POA: Insufficient documentation

## 2020-07-10 DIAGNOSIS — Z79899 Other long term (current) drug therapy: Secondary | ICD-10-CM | POA: Diagnosis not present

## 2020-07-10 LAB — CMP (CANCER CENTER ONLY)
ALT: 21 U/L (ref 0–44)
AST: 30 U/L (ref 15–41)
Albumin: 3.9 g/dL (ref 3.5–5.0)
Alkaline Phosphatase: 111 U/L (ref 38–126)
Anion gap: 5 (ref 5–15)
BUN: 16 mg/dL (ref 6–20)
CO2: 27 mmol/L (ref 22–32)
Calcium: 8.5 mg/dL — ABNORMAL LOW (ref 8.9–10.3)
Chloride: 103 mmol/L (ref 98–111)
Creatinine: 0.91 mg/dL (ref 0.61–1.24)
GFR, Estimated: >60 mL/min (ref >60)
Glucose, Bld: 267 mg/dL — ABNORMAL HIGH (ref 70–99)
Potassium: 4.6 mmol/L (ref 3.5–5.1)
Sodium: 135 mmol/L (ref 135–145)
Total Bilirubin: 0.5 mg/dL (ref 0.3–1.2)
Total Protein: 6.7 g/dL (ref 6.5–8.1)

## 2020-07-10 LAB — MAGNESIUM: Magnesium: 2 mg/dL (ref 1.7–2.4)

## 2020-07-10 LAB — CBC WITH DIFFERENTIAL (CANCER CENTER ONLY)
Abs Immature Granulocytes: 0.01 10*3/uL (ref 0.00–0.07)
Basophils Absolute: 0.1 10*3/uL (ref 0.0–0.1)
Basophils Relative: 2 %
Eosinophils Absolute: 0.4 10*3/uL (ref 0.0–0.5)
Eosinophils Relative: 8 %
HCT: 35.6 % — ABNORMAL LOW (ref 39.0–52.0)
Hemoglobin: 11.5 g/dL — ABNORMAL LOW (ref 13.0–17.0)
Immature Granulocytes: 0 %
Lymphocytes Relative: 20 %
Lymphs Abs: 0.9 10*3/uL (ref 0.7–4.0)
MCH: 33.9 pg (ref 26.0–34.0)
MCHC: 32.3 g/dL (ref 30.0–36.0)
MCV: 105 fL — ABNORMAL HIGH (ref 80.0–100.0)
Monocytes Absolute: 0.8 10*3/uL (ref 0.1–1.0)
Monocytes Relative: 17 %
Neutro Abs: 2.4 10*3/uL (ref 1.7–7.7)
Neutrophils Relative %: 53 %
Platelet Count: 226 10*3/uL (ref 150–400)
RBC: 3.39 MIL/uL — ABNORMAL LOW (ref 4.22–5.81)
RDW: 14.3 % (ref 11.5–15.5)
WBC Count: 4.5 10*3/uL (ref 4.0–10.5)
nRBC: 0 % (ref 0.0–0.2)

## 2020-07-10 MED ORDER — SODIUM CHLORIDE 0.9 % IV SOLN
Freq: Once | INTRAVENOUS | Status: AC
  Filled 2020-07-10: qty 250

## 2020-07-10 MED ORDER — POTASSIUM CHLORIDE IN NACL 20-0.9 MEQ/L-% IV SOLN
Freq: Once | INTRAVENOUS | Status: AC
  Filled 2020-07-10: qty 1000

## 2020-07-10 MED ORDER — SODIUM CHLORIDE 0.9 % IV SOLN
150.0000 mg | Freq: Once | INTRAVENOUS | Status: AC
  Administered 2020-07-10: 150 mg via INTRAVENOUS
  Filled 2020-07-10: qty 5

## 2020-07-10 MED ORDER — PALONOSETRON HCL INJECTION 0.25 MG/5ML
0.2500 mg | Freq: Once | INTRAVENOUS | Status: AC
  Administered 2020-07-10: 0.25 mg via INTRAVENOUS
  Filled 2020-07-10: qty 5

## 2020-07-10 MED ORDER — HEPARIN SOD (PORK) LOCK FLUSH 100 UNIT/ML IV SOLN
500.0000 [IU] | Freq: Once | INTRAVENOUS | Status: AC | PRN
  Administered 2020-07-10: 500 [IU]
  Filled 2020-07-10: qty 5

## 2020-07-10 MED ORDER — SODIUM CHLORIDE 0.9% FLUSH
10.0000 mL | INTRAVENOUS | Status: DC | PRN
  Administered 2020-07-10: 10 mL
  Filled 2020-07-10: qty 10

## 2020-07-10 MED ORDER — FAMOTIDINE 20 MG IN NS 100 ML IVPB
20.0000 mg | Freq: Once | INTRAVENOUS | Status: AC
  Administered 2020-07-10: 20 mg via INTRAVENOUS
  Filled 2020-07-10: qty 100

## 2020-07-10 MED ORDER — MAGNESIUM SULFATE 2 GM/50ML IV SOLN
2.0000 g | Freq: Once | INTRAVENOUS | Status: AC
  Administered 2020-07-10: 2 g via INTRAVENOUS
  Filled 2020-07-10: qty 50

## 2020-07-10 MED ORDER — SODIUM CHLORIDE 0.9 % IV SOLN
25.0000 mg/m2 | Freq: Once | INTRAVENOUS | Status: AC
  Administered 2020-07-10: 56 mg via INTRAVENOUS
  Filled 2020-07-10: qty 56

## 2020-07-10 MED ORDER — DEXAMETHASONE SODIUM PHOSPHATE 10 MG/ML IJ SOLN
5.0000 mg | Freq: Once | INTRAMUSCULAR | Status: AC
  Administered 2020-07-10: 5 mg via INTRAVENOUS
  Filled 2020-07-10: qty 1

## 2020-07-10 MED ORDER — SODIUM CHLORIDE 0.9 % IV SOLN
800.0000 mg/m2 | Freq: Once | INTRAVENOUS | Status: AC
  Administered 2020-07-10: 1786 mg via INTRAVENOUS
  Filled 2020-07-10: qty 46.97

## 2020-07-10 NOTE — Patient Instructions (Signed)
[facility]    Discharge Instructions:  Thank you for choosing [facility] to provide your oncology and hematology care.   If you have a lab appointment with the [facility], please go directly to the [facility] and check in at the registration area.   Wear comfortable clothing and clothing appropriate for easy access to any Portacath or PICC line.   We strive to give you quality time with your provider. You may need to reschedule your appointment if you arrive late (15 or more minutes).  Arriving late affects you and other patients whose appointments are after yours.  Also, if you miss three or more appointments without notifying the office, you may be dismissed from the clinic at the provider's discretion.      For prescription refill requests, have your pharmacy contact our office and allow 72 hours for refills to be completed.    Today you received the following chemotherapy and/or immunotherapy agents Gemcitabine (GEMZAR) & Cisplatin (PLATINOL).      To help prevent nausea and vomiting after your treatment, we encourage you to take your nausea medication as directed.  BELOW ARE SYMPTOMS THAT SHOULD BE REPORTED IMMEDIATELY: . *FEVER GREATER THAN 100.4 F (38 C) OR HIGHER . *CHILLS OR SWEATING . *NAUSEA AND VOMITING THAT IS NOT CONTROLLED WITH YOUR NAUSEA MEDICATION . *UNUSUAL SHORTNESS OF BREATH . *UNUSUAL BRUISING OR BLEEDING . *URINARY PROBLEMS (pain or burning when urinating, or frequent urination) . *BOWEL PROBLEMS (unusual diarrhea, constipation, pain near the anus) . TENDERNESS IN MOUTH AND THROAT WITH OR WITHOUT PRESENCE OF ULCERS (sore throat, sores in mouth, or a toothache) . UNUSUAL RASH, SWELLING OR PAIN  . UNUSUAL VAGINAL DISCHARGE OR ITCHING   Items with * indicate a potential emergency and should be followed up as soon as possible or go to the Emergency Department if any problems should occur.  Please show the  CHEMOTHERAPY ALERT CARD or IMMUNOTHERAPY ALERT CARD at check-in to the Emergency Department and triage nurse.  Should you have questions after your visit or need to cancel or reschedule your appointment, please contact [facility]  Dept: [phone]  and follow the prompts.  Office hours are 8:00 a.m. to 4:30 p.m. Monday - Friday. Please note that voicemails left after 4:00 p.m. may not be returned until the following business day.  We are closed weekends and major holidays. You have access to a nurse at all times for urgent questions. Please call the main number to the clinic Dept: [phone] and follow the prompts.   For any non-urgent questions, you may also contact your provider using MyChart. We now offer e-Visits for anyone 64 and older to request care online for non-urgent symptoms. For details visit [email or web address].   Also download the MyChart app! Go to the app store, search "MyChart", open the app, select [facility], and log in with your MyChart username and password.  Due to Covid, a mask is required upon entering the hospital/clinic. If you do not have a mask, one will be given to you upon arrival. For doctor visits, patients may have 1 support person aged 2 or older with them. For treatment visits, patients cannot have anyone with them due to current Covid guidelines and our immunocompromised population.   Gemcitabine injection What is this medicine? GEMCITABINE (jem SYE ta been) is a chemotherapy drug. This medicine is used to treat many types of cancer like breast cancer, lung cancer, pancreatic cancer,  and ovarian cancer. This medicine may be used for other purposes; ask your health care provider or pharmacist if you have questions. COMMON BRAND NAME(S): Gemzar, Infugem What should I tell my health care provider before I take this medicine? They need to know if you have any of these conditions:  blood disorders  infection  kidney  disease  liver disease  lung or breathing disease, like asthma  recent or ongoing radiation therapy  an unusual or allergic reaction to gemcitabine, other chemotherapy, other medicines, foods, dyes, or preservatives  pregnant or trying to get pregnant  breast-feeding How should I use this medicine? This drug is given as an infusion into a vein. It is administered in a hospital or clinic by a specially trained health care professional. Talk to your pediatrician regarding the use of this medicine in children. Special care may be needed. Overdosage: If you think you have taken too much of this medicine contact a poison control center or emergency room at once. NOTE: This medicine is only for you. Do not share this medicine with others. What if I miss a dose? It is important not to miss your dose. Call your doctor or health care professional if you are unable to keep an appointment. What may interact with this medicine?  medicines to increase blood counts like filgrastim, pegfilgrastim, sargramostim  some other chemotherapy drugs like cisplatin  vaccines Talk to your doctor or health care professional before taking any of these medicines:  acetaminophen  aspirin  ibuprofen  ketoprofen  naproxen This list may not describe all possible interactions. Give your health care provider a list of all the medicines, herbs, non-prescription drugs, or dietary supplements you use. Also tell them if you smoke, drink alcohol, or use illegal drugs. Some items may interact with your medicine. What should I watch for while using this medicine? Visit your doctor for checks on your progress. This drug may make you feel generally unwell. This is not uncommon, as chemotherapy can affect healthy cells as well as cancer cells. Report any side effects. Continue your course of treatment even though you feel ill unless your doctor tells you to stop. In some cases, you may be given additional medicines to  help with side effects. Follow all directions for their use. Call your doctor or health care professional for advice if you get a fever, chills or sore throat, or other symptoms of a cold or flu. Do not treat yourself. This drug decreases your body's ability to fight infections. Try to avoid being around people who are sick. This medicine may increase your risk to bruise or bleed. Call your doctor or health care professional if you notice any unusual bleeding. Be careful brushing and flossing your teeth or using a toothpick because you may get an infection or bleed more easily. If you have any dental work done, tell your dentist you are receiving this medicine. Avoid taking products that contain aspirin, acetaminophen, ibuprofen, naproxen, or ketoprofen unless instructed by your doctor. These medicines may hide a fever. Do not become pregnant while taking this medicine or for 6 months after stopping it. Women should inform their doctor if they wish to become pregnant or think they might be pregnant. Men should not father a child while taking this medicine and for 3 months after stopping it. There is a potential for serious side effects to an unborn child. Talk to your health care professional or pharmacist for more information. Do not breast-feed an infant while taking this  medicine or for at least 1 week after stopping it. Men should inform their doctors if they wish to father a child. This medicine may lower sperm counts. Talk with your doctor or health care professional if you are concerned about your fertility. What side effects may I notice from receiving this medicine? Side effects that you should report to your doctor or health care professional as soon as possible:  allergic reactions like skin rash, itching or hives, swelling of the face, lips, or tongue  breathing problems  pain, redness, or irritation at site where injected  signs and symptoms of a dangerous change in heartbeat or heart  rhythm like chest pain; dizziness; fast or irregular heartbeat; palpitations; feeling faint or lightheaded, falls; breathing problems  signs of decreased platelets or bleeding - bruising, pinpoint red spots on the skin, black, tarry stools, blood in the urine  signs of decreased red blood cells - unusually weak or tired, feeling faint or lightheaded, falls  signs of infection - fever or chills, cough, sore throat, pain or difficulty passing urine  signs and symptoms of kidney injury like trouble passing urine or change in the amount of urine  signs and symptoms of liver injury like dark yellow or brown urine; general ill feeling or flu-like symptoms; light-colored stools; loss of appetite; nausea; right upper belly pain; unusually weak or tired; yellowing of the eyes or skin  swelling of ankles, feet, hands Side effects that usually do not require medical attention (report to your doctor or health care professional if they continue or are bothersome):  constipation  diarrhea  hair loss  loss of appetite  nausea  rash  vomiting This list may not describe all possible side effects. Call your doctor for medical advice about side effects. You may report side effects to FDA at 1-800-FDA-1088. Where should I keep my medicine? This drug is given in a hospital or clinic and will not be stored at home. NOTE: This sheet is a summary. It may not cover all possible information. If you have questions about this medicine, talk to your doctor, pharmacist, or health care provider.  2021 Elsevier/Gold Standard (2017-04-16 18:06:11)  Cisplatin injection What is this medicine? CISPLATIN (SIS pla tin) is a chemotherapy drug. It targets fast dividing cells, like cancer cells, and causes these cells to die. This medicine is used to treat many types of cancer like bladder, ovarian, and testicular cancers. This medicine may be used for other purposes; ask your health care provider or pharmacist if you  have questions. COMMON BRAND NAME(S): Platinol, Platinol -AQ What should I tell my health care provider before I take this medicine? They need to know if you have any of these conditions:  eye disease, vision problems  hearing problems  kidney disease  low blood counts, like white cells, platelets, or red blood cells  tingling of the fingers or toes, or other nerve disorder  an unusual or allergic reaction to cisplatin, carboplatin, oxaliplatin, other medicines, foods, dyes, or preservatives  pregnant or trying to get pregnant  breast-feeding How should I use this medicine? This drug is given as an infusion into a vein. It is administered in a hospital or clinic by a specially trained health care professional. Talk to your pediatrician regarding the use of this medicine in children. Special care may be needed. Overdosage: If you think you have taken too much of this medicine contact a poison control center or emergency room at once. NOTE: This medicine is only for  you. Do not share this medicine with others. What if I miss a dose? It is important not to miss a dose. Call your doctor or health care professional if you are unable to keep an appointment. What may interact with this medicine? This medicine may interact with the following medications:  foscarnet  certain antibiotics like amikacin, gentamicin, neomycin, polymyxin B, streptomycin, tobramycin, vancomycin This list may not describe all possible interactions. Give your health care provider a list of all the medicines, herbs, non-prescription drugs, or dietary supplements you use. Also tell them if you smoke, drink alcohol, or use illegal drugs. Some items may interact with your medicine. What should I watch for while using this medicine? Your condition will be monitored carefully while you are receiving this medicine. You will need important blood work done while you are taking this medicine. This drug may make you feel  generally unwell. This is not uncommon, as chemotherapy can affect healthy cells as well as cancer cells. Report any side effects. Continue your course of treatment even though you feel ill unless your doctor tells you to stop. This medicine may increase your risk of getting an infection. Call your healthcare professional for advice if you get a fever, chills, or sore throat, or other symptoms of a cold or flu. Do not treat yourself. Try to avoid being around people who are sick. Avoid taking medicines that contain aspirin, acetaminophen, ibuprofen, naproxen, or ketoprofen unless instructed by your healthcare professional. These medicines may hide a fever. This medicine may increase your risk to bruise or bleed. Call your doctor or health care professional if you notice any unusual bleeding. Be careful brushing and flossing your teeth or using a toothpick because you may get an infection or bleed more easily. If you have any dental work done, tell your dentist you are receiving this medicine. Do not become pregnant while taking this medicine or for 14 months after stopping it. Women should inform their healthcare professional if they wish to become pregnant or think they might be pregnant. Men should not father a child while taking this medicine and for 11 months after stopping it. There is potential for serious side effects to an unborn child. Talk to your healthcare professional for more information. Do not breast-feed an infant while taking this medicine. This medicine has caused ovarian failure in some women. This medicine may make it more difficult to get pregnant. Talk to your healthcare professional if you are concerned about your fertility. This medicine has caused decreased sperm counts in some men. This may make it more difficult to father a child. Talk to your healthcare professional if you are concerned about your fertility. Drink fluids as directed while you are taking this medicine. This will  help protect your kidneys. Call your doctor or health care professional if you get diarrhea. Do not treat yourself. What side effects may I notice from receiving this medicine? Side effects that you should report to your doctor or health care professional as soon as possible:  allergic reactions like skin rash, itching or hives, swelling of the face, lips, or tongue  blurred vision  changes in vision  decreased hearing or ringing of the ears  nausea, vomiting  pain, redness, or irritation at site where injected  pain, tingling, numbness in the hands or feet  signs and symptoms of bleeding such as bloody or black, tarry stools; red or dark brown urine; spitting up blood or brown material that looks like coffee grounds; red  spots on the skin; unusual bruising or bleeding from the eyes, gums, or nose  signs and symptoms of infection like fever; chills; cough; sore throat; pain or trouble passing urine  signs and symptoms of kidney injury like trouble passing urine or change in the amount of urine  signs and symptoms of low red blood cells or anemia such as unusually weak or tired; feeling faint or lightheaded; falls; breathing problems Side effects that usually do not require medical attention (report to your doctor or health care professional if they continue or are bothersome):  loss of appetite  mouth sores  muscle cramps This list may not describe all possible side effects. Call your doctor for medical advice about side effects. You may report side effects to FDA at 1-800-FDA-1088. Where should I keep my medicine? This drug is given in a hospital or clinic and will not be stored at home. NOTE: This sheet is a summary. It may not cover all possible information. If you have questions about this medicine, talk to your doctor, pharmacist, or health care provider.  2021 Elsevier/Gold Standard (2018-01-16 15:59:17)

## 2020-07-11 ENCOUNTER — Inpatient Hospital Stay: Payer: No Typology Code available for payment source

## 2020-07-11 VITALS — BP 144/77 | HR 70 | Temp 98.3°F | Resp 18

## 2020-07-11 DIAGNOSIS — Z5111 Encounter for antineoplastic chemotherapy: Secondary | ICD-10-CM | POA: Diagnosis not present

## 2020-07-11 DIAGNOSIS — C221 Intrahepatic bile duct carcinoma: Secondary | ICD-10-CM

## 2020-07-11 MED ORDER — PEGFILGRASTIM-JMDB 6 MG/0.6ML ~~LOC~~ SOSY
6.0000 mg | PREFILLED_SYRINGE | Freq: Once | SUBCUTANEOUS | Status: AC
  Administered 2020-07-11: 6 mg via SUBCUTANEOUS

## 2020-07-11 NOTE — Patient Instructions (Signed)
Pegfilgrastim injection What is this medicine? PEGFILGRASTIM (PEG fil gra stim) is a long-acting granulocyte colony-stimulating factor that stimulates the growth of neutrophils, a type of white blood cell important in the body's fight against infection. It is used to reduce the incidence of fever and infection in patients with certain types of cancer who are receiving chemotherapy that affects the bone marrow, and to increase survival after being exposed to high doses of radiation. This medicine may be used for other purposes; ask your health care provider or pharmacist if you have questions. COMMON BRAND NAME(S): Fulphila, Neulasta, Nyvepria, UDENYCA, Ziextenzo What should I tell my health care provider before I take this medicine? They need to know if you have any of these conditions:  kidney disease  latex allergy  ongoing radiation therapy  sickle cell disease  skin reactions to acrylic adhesives (On-Body Injector only)  an unusual or allergic reaction to pegfilgrastim, filgrastim, other medicines, foods, dyes, or preservatives  pregnant or trying to get pregnant  breast-feeding How should I use this medicine? This medicine is for injection under the skin. If you get this medicine at home, you will be taught how to prepare and give the pre-filled syringe or how to use the On-body Injector. Refer to the patient Instructions for Use for detailed instructions. Use exactly as directed. Tell your healthcare provider immediately if you suspect that the On-body Injector may not have performed as intended or if you suspect the use of the On-body Injector resulted in a missed or partial dose. It is important that you put your used needles and syringes in a special sharps container. Do not put them in a trash can. If you do not have a sharps container, call your pharmacist or healthcare provider to get one. Talk to your pediatrician regarding the use of this medicine in children. While this drug  may be prescribed for selected conditions, precautions do apply. Overdosage: If you think you have taken too much of this medicine contact a poison control center or emergency room at once. NOTE: This medicine is only for you. Do not share this medicine with others. What if I miss a dose? It is important not to miss your dose. Call your doctor or health care professional if you miss your dose. If you miss a dose due to an On-body Injector failure or leakage, a new dose should be administered as soon as possible using a single prefilled syringe for manual use. What may interact with this medicine? Interactions have not been studied. This list may not describe all possible interactions. Give your health care provider a list of all the medicines, herbs, non-prescription drugs, or dietary supplements you use. Also tell them if you smoke, drink alcohol, or use illegal drugs. Some items may interact with your medicine. What should I watch for while using this medicine? Your condition will be monitored carefully while you are receiving this medicine. You may need blood work done while you are taking this medicine. Talk to your health care provider about your risk of cancer. You may be more at risk for certain types of cancer if you take this medicine. If you are going to need a MRI, CT scan, or other procedure, tell your doctor that you are using this medicine (On-Body Injector only). What side effects may I notice from receiving this medicine? Side effects that you should report to your doctor or health care professional as soon as possible:  allergic reactions (skin rash, itching or hives, swelling of   the face, lips, or tongue)  back pain  dizziness  fever  pain, redness, or irritation at site where injected  pinpoint red spots on the skin  red or dark-brown urine  shortness of breath or breathing problems  stomach or side pain, or pain at the shoulder  swelling  tiredness  trouble  passing urine or change in the amount of urine  unusual bruising or bleeding Side effects that usually do not require medical attention (report to your doctor or health care professional if they continue or are bothersome):  bone pain  muscle pain This list may not describe all possible side effects. Call your doctor for medical advice about side effects. You may report side effects to FDA at 1-800-FDA-1088. Where should I keep my medicine? Keep out of the reach of children. If you are using this medicine at home, you will be instructed on how to store it. Throw away any unused medicine after the expiration date on the label. NOTE: This sheet is a summary. It may not cover all possible information. If you have questions about this medicine, talk to your doctor, pharmacist, or health care provider.  2021 Elsevier/Gold Standard (2019-02-12 13:20:51)  

## 2020-07-18 ENCOUNTER — Encounter: Payer: Self-pay | Admitting: General Practice

## 2020-07-18 NOTE — Progress Notes (Signed)
[facility] Note  Reached out to [employer] by phone for pastoral check-in. Caught him on vacation so will follow up next week instead.   [place], [place], [facility] Pager [phone] Voicemail [phone]

## 2020-07-21 ENCOUNTER — Other Ambulatory Visit (HOSPITAL_BASED_OUTPATIENT_CLINIC_OR_DEPARTMENT_OTHER): Payer: Self-pay

## 2020-07-22 ENCOUNTER — Ambulatory Visit
Admission: EM | Admit: 2020-07-22 | Discharge: 2020-07-22 | Disposition: A | Discharge status: Home / Self Care | Payer: No Typology Code available for payment source

## 2020-07-22 ENCOUNTER — Ambulatory Visit: Payer: Self-pay

## 2020-07-22 ENCOUNTER — Encounter (HOSPITAL_COMMUNITY): Payer: Self-pay | Admitting: Emergency Medicine

## 2020-07-22 ENCOUNTER — Emergency Department (HOSPITAL_COMMUNITY)
Admission: EM | Admit: 2020-07-22 | Discharge: 2020-07-22 | Disposition: A | Discharge status: Home / Self Care | Payer: No Typology Code available for payment source | Attending: Emergency Medicine | Admitting: Emergency Medicine

## 2020-07-22 ENCOUNTER — Emergency Department (HOSPITAL_COMMUNITY): Payer: No Typology Code available for payment source

## 2020-07-22 ENCOUNTER — Other Ambulatory Visit: Payer: Self-pay

## 2020-07-22 DIAGNOSIS — Z794 Long term (current) use of insulin: Secondary | ICD-10-CM | POA: Insufficient documentation

## 2020-07-22 DIAGNOSIS — Z79899 Other long term (current) drug therapy: Secondary | ICD-10-CM | POA: Insufficient documentation

## 2020-07-22 DIAGNOSIS — Z8507 Personal history of malignant neoplasm of pancreas: Secondary | ICD-10-CM | POA: Diagnosis not present

## 2020-07-22 DIAGNOSIS — U071 COVID-19: Secondary | ICD-10-CM | POA: Diagnosis not present

## 2020-07-22 DIAGNOSIS — R6889 Other general symptoms and signs: Secondary | ICD-10-CM

## 2020-07-22 DIAGNOSIS — I10 Essential (primary) hypertension: Secondary | ICD-10-CM | POA: Insufficient documentation

## 2020-07-22 DIAGNOSIS — Z87891 Personal history of nicotine dependence: Secondary | ICD-10-CM | POA: Insufficient documentation

## 2020-07-22 DIAGNOSIS — E119 Type 2 diabetes mellitus without complications: Secondary | ICD-10-CM | POA: Insufficient documentation

## 2020-07-22 DIAGNOSIS — J029 Acute pharyngitis, unspecified: Secondary | ICD-10-CM | POA: Diagnosis present

## 2020-07-22 LAB — CBC WITH DIFFERENTIAL/PLATELET
Abs Immature Granulocytes: 0.06 10*3/uL (ref 0.00–0.07)
Basophils Absolute: 0 10*3/uL (ref 0.0–0.1)
Basophils Relative: 0 %
Eosinophils Absolute: 0.1 10*3/uL (ref 0.0–0.5)
Eosinophils Relative: 0 %
HCT: 34.9 % — ABNORMAL LOW (ref 39.0–52.0)
Hemoglobin: 11.7 g/dL — ABNORMAL LOW (ref 13.0–17.0)
Immature Granulocytes: 1 %
Lymphocytes Relative: 10 %
Lymphs Abs: 1.3 10*3/uL (ref 0.7–4.0)
MCH: 34.3 pg — ABNORMAL HIGH (ref 26.0–34.0)
MCHC: 33.5 g/dL (ref 30.0–36.0)
MCV: 102.3 fL — ABNORMAL HIGH (ref 80.0–100.0)
Monocytes Absolute: 1 10*3/uL (ref 0.1–1.0)
Monocytes Relative: 8 %
Neutro Abs: 9.9 10*3/uL — ABNORMAL HIGH (ref 1.7–7.7)
Neutrophils Relative %: 81 %
Platelets: 55 10*3/uL — ABNORMAL LOW (ref 150–400)
RBC: 3.41 MIL/uL — ABNORMAL LOW (ref 4.22–5.81)
RDW: 14 % (ref 11.5–15.5)
WBC: 12.3 10*3/uL — ABNORMAL HIGH (ref 4.0–10.5)
nRBC: 0 % (ref 0.0–0.2)

## 2020-07-22 LAB — COMPREHENSIVE METABOLIC PANEL
ALT: 31 U/L (ref 0–44)
AST: 52 U/L — ABNORMAL HIGH (ref 15–41)
Albumin: 3.5 g/dL (ref 3.5–5.0)
Alkaline Phosphatase: 158 U/L — ABNORMAL HIGH (ref 38–126)
Anion gap: 7 (ref 5–15)
BUN: 11 mg/dL (ref 6–20)
CO2: 26 mmol/L (ref 22–32)
Calcium: 8.5 mg/dL — ABNORMAL LOW (ref 8.9–10.3)
Chloride: 94 mmol/L — ABNORMAL LOW (ref 98–111)
Creatinine, Ser: 0.92 mg/dL (ref 0.61–1.24)
GFR, Estimated: >60 mL/min (ref >60)
Glucose, Bld: 218 mg/dL — ABNORMAL HIGH (ref 70–99)
Potassium: 4.1 mmol/L (ref 3.5–5.1)
Sodium: 127 mmol/L — ABNORMAL LOW (ref 135–145)
Total Bilirubin: 1 mg/dL (ref 0.3–1.2)
Total Protein: 6.7 g/dL (ref 6.5–8.1)

## 2020-07-22 LAB — MAGNESIUM: Magnesium: 1.6 mg/dL — ABNORMAL LOW (ref 1.7–2.4)

## 2020-07-22 MED ORDER — MAGNESIUM SULFATE 2 GM/50ML IV SOLN
2.0000 g | Freq: Once | INTRAVENOUS | Status: AC
  Administered 2020-07-22: 2 g via INTRAVENOUS
  Filled 2020-07-22: qty 50

## 2020-07-22 MED ORDER — PAXLOVID 10 X 150 MG & 10 X 100MG PO TBPK: 2.0000 | ORAL_TABLET | Freq: Two times a day (BID) | ORAL | 0 refills | Status: DC

## 2020-07-22 MED ORDER — MAG-200 200 MG PO TABS: 200.0000 mg | ORAL_TABLET | Freq: Every day | ORAL | 0 refills | Status: AC

## 2020-07-22 MED ORDER — PAXLOVID 10 X 150 MG & 10 X 100MG PO TBPK: 2.0000 | ORAL_TABLET | Freq: Two times a day (BID) | ORAL | 0 refills | Status: AC

## 2020-07-22 MED ORDER — KETOROLAC TROMETHAMINE 30 MG/ML IJ SOLN
30.0000 mg | Freq: Once | INTRAMUSCULAR | Status: AC
  Administered 2020-07-22: 30 mg via INTRAVENOUS
  Filled 2020-07-22: qty 1

## 2020-07-22 MED ORDER — ONDANSETRON 4 MG PO TBDP: 4.0000 mg | ORAL_TABLET | Freq: Three times a day (TID) | ORAL | 0 refills | Status: DC | PRN

## 2020-07-22 MED ORDER — SODIUM CHLORIDE 0.9 % IV BOLUS
1000.0000 mL | Freq: Once | INTRAVENOUS | Status: AC
  Administered 2020-07-22: 1000 mL via INTRAVENOUS

## 2020-07-22 NOTE — ED Notes (Signed)
Pt to be discharged when magnesium infusion is complete.

## 2020-07-22 NOTE — Discharge Instructions (Addendum)
Please follow-up with your oncologist this week as discussed.  We are starting you on this antiviral medication.  Please pick it up from the pharmacy and take your first dose today.  If you have any concern about side effects, please call your oncologist to discuss this medication.

## 2020-07-22 NOTE — ED Provider Notes (Signed)
[facility] Provider Note   CSN: [id number] Arrival date & time: 07/22/20  1519     History Chief Complaint  Patient presents with   Covid Positive    Sore throat, runny nose, chills     Matthew Osborne is a 45 y.o. male with a history of hepatobiliary cancer, on chemotherapy, presenting to the ED with COVID symptoms.  He reports onset of symptoms 3 days ago on Thursday.  He reports sore throat, headache, rhinorrhea, productive cough, muscle aches, leg cramping.  He also reports fevers and chills.  His wife reports that he tested positive at home for COVID today.  They went to an urgent care initially who referred him into the ED due to his history of immunocompromise.  Follows with oncology at [facility], Dr Learta Codding.  He does have diabetes and is on insulin for this.  He does not tolerate steroids well for this reason for his brittle hyperglycemia  HPI     Past Medical History:  Diagnosis Date   Cancer ([place])    De Quervain's tenosynovitis, left 03/2011   Diabetes mellitus    NIDDM   Family history of breast cancer    Family history of Hodgkin's lymphoma    Hyperlipidemia    Hypertension    under control; has been on med. x 4 yrs.   Nonproliferative retinopathy due to secondary diabetes [facility])     Patient Active Problem List   Diagnosis Date Noted   Genetic testing 03/24/2020   Family history of breast cancer    Family history of Hodgkin's lymphoma    Cholangiocarcinoma ([place]) 02/01/2020   Goals of care, counseling/discussion 01/21/2020   Pancreas cancer ([place]) 01/21/2020   Hypercalcemia 01/14/2020   Left elbow pain 05/19/2019   Numbness 05/19/2019   Nonproliferative retinopathy due to secondary diabetes ([place])    Allergic rhinitis 06/17/2014   Sinus congestion 05/27/2014   Chronic cough 02/14/2014   Essential hypertension, benign 06/30/2013   Hyperlipidemia 06/30/2013   Diabetes ([place]) 06/30/2013    Past Surgical History:   Procedure Laterality Date   CARPAL TUNNEL RELEASE     bilat.   DORSAL COMPARTMENT RELEASE  03/14/2011   Procedure: RELEASE DORSAL COMPARTMENT (DEQUERVAIN);  Surgeon: Paulene Floor, MD;  Location: [place];  Service: Orthopedics;  Laterality: Left;  left wrist APL and EPL tenosynovectomy and 1st dorsal compartment release   IR IMAGING GUIDED PORT INSERTION  02/01/2020   TONSILLECTOMY AND ADENOIDECTOMY  as a child   VASECTOMY         Family History  Problem Relation Age of Onset   Cancer Other    Hyperlipidemia Other    Hypertension Brother    Asthma Son    Lung cancer Maternal Grandfather    Mesothelioma Maternal Grandfather        asbestos exposure   Rheum arthritis Maternal Grandmother    Breast cancer Maternal Grandmother 23   Hodgkin's lymphoma Mother 57       30 lb tumor removed from abdomen   Cirrhosis Paternal Grandmother    Other Maternal Great-grandfather        black lung, worked in [employer] (MGF's father)    Social History   Tobacco Use   Smoking status: Former    Packs/day: 3.00    Years: 6.00    Pack years: 18.00    Types: Cigarettes    Quit date: 02/05/1999    Years since quitting: 21.4  Smokeless tobacco: Never  Vaping Use   Vaping Use: Never used  Substance Use Topics   Alcohol use: No    Alcohol/week: 0.0 standard drinks   Drug use: No    Home Medications Prior to Admission medications   Medication Sig Start Date End Date Taking? Authorizing Provider  Magnesium Oxide (MAG-200) 200 MG TABS Take 1 tablet (200 mg total) by mouth daily for 20 days. 07/22/20 08/11/20 Yes Erastus Bartolomei, Carola Rhine, MD  ondansetron (ZOFRAN ODT) 4 MG disintegrating tablet Take 1 tablet (4 mg total) by mouth every 8 (eight) hours as needed for up to 15 doses for nausea or vomiting. 07/22/20  Yes Allyiah Gartner, Carola Rhine, MD  bisoprolol-hydrochlorothiazide (ZIAC) 10-6.25 MG tablet TAKE 1 TABLET BY MOUTH EVERY DAY IN THE MORNING Patient taking differently: Take 1  tablet by mouth daily. 12/09/19   Arnoldo Lenis, MD  blood glucose meter kit and supplies KIT Fasting prior to each meal three times a day 08/25/19   Annie Main, FNP  chlorpheniramine-HYDROcodone (TUSSIONEX PENNKINETIC ER) 10-8 MG/5ML SUER Take 5 mLs by mouth every 12 (twelve) hours as needed for cough. Patient not taking: Reported on 07/10/2020 01/13/20   Susy Frizzle, MD  Continuous Blood Gluc Receiver (DEXCOM G6 RECEIVER) DEVI USE AS DIRECTED 03/03/20 03/03/21  Susy Frizzle, MD  Continuous Blood Gluc Sensor (DEXCOM G6 SENSOR) MISC Use as directed to check blood sugar 5-6 times daily. 04/05/20   Susy Frizzle, MD  Continuous Blood Gluc Transmit (DEXCOM G6 TRANSMITTER) MISC USE AS DIRECTED TO CHECK BLOOD SUGAR 5-6 TIMES DAILY 03/03/20 03/03/21  Susy Frizzle, MD  glucose blood test strip Dispense based on patient and insurance preference.  Use twice daily as directed. (FOR ICD-10 E11.65) 08/29/17   Susy Frizzle, MD  Insulin Glargine-yfgn (SEMGLEE, YFGN,) 100 UNIT/ML SOPN Inject 70 Units into the skin 2 (two) times daily. 06/09/20   Susy Frizzle, MD  insulin lispro (HUMALOG) 100 UNIT/ML KwikPen CHECK BG PRIOR TO MEALS & IF BG IS 150-200 2 UNITS,BG 201-250 4 UNIT,251-300 6 U,301-350 8 U, 351-400 10 U. OVER 401 12 UNIT(S) & CALL MD 02/10/20 02/09/21  Susy Frizzle, MD  Insulin Pen Needle 31G X 5 MM MISC USE 4 PER DAY TO INJECT HUMALOG KWIKPEN & BASAGLAR. 02/10/20 02/09/21  Susy Frizzle, MD  Insulin Syringe 27G X 1/2" 0.5 ML MISC Use as directed to inject insulin SQ Q1D. 01/31/20   Susy Frizzle, MD  Lancets Thin MISC Check BS BID 08/29/17   Susy Frizzle, MD  lidocaine-prilocaine (EMLA) cream APPLY 1 APPLICATION TOPICALLY AS DIRECTED. APPLY TO PORT SITE 1-2 HOURS PRIOR TO STICK AND COVER WITH PLASTIC WRAP. 01/26/20 01/25/21  Ladell Pier, MD  nirmatrelvir/ritonavir EUA, renal dosing, (PAXLOVID) TBPK Take 2 tablets by mouth 2 (two) times daily for 5 days. Patient GFR  is 60. Take nirmatrelvir (150 mg) one tablet twice daily for 5 days and ritonavir (100 mg) one tablet twice daily for 5 days. 07/22/20 07/27/20  Wyvonnia Dusky, MD  ondansetron (ZOFRAN) 8 MG tablet TAKE 1 TABLET BY MOUTH EVERY 8 HOURS AS NEEDED FOR NAUSEA OR VOMITING. BEGIN 72 HOURS AFTER IV CHEMOTHERAPY TREATMENT 05/01/20 05/01/21  Owens Shark, NP  oxyCODONE (OXY IR/ROXICODONE) 5 MG immediate release tablet TAKE 1 TABLET BY MOUTH EVERY 6 HOURS AS NEEDED FOR SEVERE PAIN. 05/01/20 10/28/20  Owens Shark, NP  prochlorperazine (COMPAZINE) 10 MG tablet TAKE 1 TABLET BY MOUTH EVERY 6 HOURS  AS NEEDED FOR NAUSEA 01/26/20 01/25/21  Ladell Pier, MD  traMADol (ULTRAM) 50 MG tablet TAKE 1 TABLET BY MOUTH EVERY 6 HOURS AS NEEDED FOR MODERATE PAIN (COUGH). 05/01/20 10/28/20  Owens Shark, NP  TRUEPLUS LANCETS 33G MISC 1 each by Does not apply route 2 (two) times daily. 07/06/13   Susy Frizzle, MD  Insulin [facility]) 100 UNIT/ML INJECT 70 UNITS INTO THE SKIN TWICE A DAY 03/21/20 06/09/20  Susy Frizzle, MD    Allergies    Niaspan [niacin]  Review of Systems   Review of Systems  Constitutional:  Positive for appetite change, chills, fatigue and fever.  HENT:  Positive for congestion and sore throat.   Eyes:  Negative for pain and visual disturbance.  Respiratory:  Positive for cough and shortness of breath.   Cardiovascular:  Negative for chest pain and palpitations.  Gastrointestinal:  Negative for abdominal pain and vomiting.  Genitourinary:  Negative for dysuria and hematuria.  Musculoskeletal:  Positive for arthralgias and myalgias.  Skin:  Negative for color change and rash.  Neurological:  Positive for headaches. Negative for syncope.  All other systems reviewed and are negative.  Physical Exam Updated Vital Signs BP 129/78   Pulse 82   Temp 98.3 F (36.8 C) (Oral)   Resp 19   Ht 6' 2"  (1.88 m)   Wt 110 kg   SpO2 93%   BMI 31.14 kg/m   Physical  Exam Constitutional:      General: He is not in acute distress. HENT:     Head: Normocephalic and atraumatic.  Eyes:     Conjunctiva/sclera: Conjunctivae normal.     Pupils: Pupils are equal, round, and reactive to light.  Cardiovascular:     Rate and Rhythm: Normal rate and regular rhythm.  Pulmonary:     Effort: Pulmonary effort is normal. No respiratory distress.     Comments: 95% on room air Abdominal:     General: There is no distension.     Tenderness: There is no abdominal tenderness.  Skin:    General: Skin is warm and dry.  Neurological:     General: No focal deficit present.     Mental Status: He is alert. Mental status is at baseline.  Psychiatric:        Mood and Affect: Mood normal.        Behavior: Behavior normal.    ED Results / Procedures / Treatments   Labs (all labs ordered are listed, but only abnormal results are displayed) Labs Reviewed  COMPREHENSIVE METABOLIC PANEL - Abnormal; Notable for the following components:      Result Value   Sodium 127 (*)    Chloride 94 (*)    Glucose, Bld 218 (*)    Calcium 8.5 (*)    AST 52 (*)    Alkaline Phosphatase 158 (*)    All other components within normal limits  CBC WITH DIFFERENTIAL/PLATELET - Abnormal; Notable for the following components:   WBC 12.3 (*)    RBC 3.41 (*)    Hemoglobin 11.7 (*)    HCT 34.9 (*)    MCV 102.3 (*)    MCH 34.3 (*)    Platelets 55 (*)    Neutro Abs 9.9 (*)    All other components within normal limits  MAGNESIUM - Abnormal; Notable for the following components:   Magnesium 1.6 (*)    All other components within normal limits    EKG None  Radiology DG Chest Portable 1 View  Result Date: 07/22/2020 CLINICAL DATA:  Productive cough.  COVID positive. EXAM: PORTABLE CHEST 1 VIEW COMPARISON:  01/01/2020, chest CT 06/06/2020 FINDINGS: Right chest port in place with tip in the mid SVC. Lung volumes are low. Upper normal heart size is likely accentuated by portable technique. No  confluent or acute airspace disease. No pleural fluid or pneumothorax. No pulmonary edema. IMPRESSION: Low lung volumes. No evidence of pneumonia. Electronically Signed   By: Keith Rake M.D.   On: 07/22/2020 16:46    Procedures Procedures   Medications Ordered in ED Medications  sodium chloride 0.9 % bolus 1,000 mL (0 mLs Intravenous Stopped 07/22/20 1717)  ketorolac (TORADOL) 30 MG/ML injection 30 mg (30 mg Intravenous Given 07/22/20 1621)  magnesium sulfate IVPB 2 g 50 mL (0 g Intravenous Stopped 07/22/20 1845)    ED Course  I have reviewed the triage vital signs and the nursing notes.  Pertinent labs & imaging results that were available during my care of the patient were reviewed by me and considered in my medical decision making (see chart for details).  45 yo male here with covid viral illness, tested positive at home, now on day 3 of symptoms.  No hypoxia or respiratory distress on arrival. DG chest reviewed - no focal bacterial PNA noted  Labs reviewed - some signs of dehydration/poor PO intake, with mild hyponatremia.  Mg also low at 1.6, which may be contributing to muscle cramping.  We gave NS fluids and IV magnesium here, along with toradol for headache and sore throat.  At this time I think his symptoms can be managed at home.  We discussed Paxlovid options and f/u with his oncologist, and the importance of keeping hydrated and eating at home.  He feels he can make a better effort with food at home.  I think this is a reasonable plan.  Return precautions were discussed with him and his family member.  Matthew Osborne was evaluated in Emergency Department on 07/23/2020 for the symptoms described in the history of present illness. He was evaluated in the context of the global COVID-19 pandemic, which necessitated consideration that the patient might be at risk for infection with the SARS-CoV-2 virus that causes COVID-19. Institutional protocols and algorithms that pertain to the  evaluation of patients at risk for COVID-19 are in a state of rapid change based on information released by regulatory bodies including the CDC and federal and state organizations. These policies and algorithms were followed during the patient's care in the ED.   Clinical Course as of 07/23/20 1004  Sat Jul 22, 2020  1611 gemcitabine/cisplatin/Durvalumab chemo therapy [MT]  1758 I spoke to our ED pharmacist regarding the patient's home medications, and we reviewed these and these are compatible with Paxlovid.  I also discussed his chemo medications with Dr Alen Blew from oncology who reported that these IV medications should be compatible with Paxlovid as well.  Patient's kidney function is normal here today.  Therefore, I do think he is a good candidate for Paxil bid.  I can prescribe this medication for use at home.  I updated the patient regarding his magnesium and sodium levels.  He will need to make an effort to eat and drink more at home.  But otherwise he will be okay for discharge  [MT]    Clinical Course User Index [MT] Waller Marcussen, Carola Rhine, MD    Final Clinical Impression(s) / ED Diagnoses Final diagnoses:  [id number]  Hypomagnesemia    Rx / DC Orders ED Discharge Orders          Ordered    nirmatrelvir/ritonavir EUA, renal dosing, (PAXLOVID) TBPK  2 times daily,   Status:  Discontinued       Note to Pharmacy: All patient medications including chemotherapy cleared with specialist and oncologist - okay for this medication   07/22/20 1813    Magnesium Oxide (MAG-200) 200 MG TABS  Daily        07/22/20 1813    nirmatrelvir/ritonavir EUA, renal dosing, (PAXLOVID) TBPK  2 times daily       Note to Pharmacy: All patient medications including chemotherapy cleared with specialist and oncologist - okay for this medication   07/22/20 1813    ondansetron (ZOFRAN ODT) 4 MG disintegrating tablet  Every 8 hours PRN        07/22/20 1846             Wyvonnia Dusky, MD 07/23/20 1005

## 2020-07-22 NOTE — ED Triage Notes (Signed)
Pt began feeling sick 2 days ago, tested positive for covid this morning, currently undergoing chemo treatments

## 2020-07-22 NOTE — ED Triage Notes (Signed)
Pt arrives POV for runny nose, sore throat, chills, low grade fever. Symptoms began thursday, tested COVID + this morning. No relief w/ ibuprofen. Hx CA, and exertional cough that pt takes tramadol for w/ no relief recently.

## 2020-07-22 NOTE — ED Provider Notes (Signed)
[facility]   [id number] 07/22/20 Arrival Time: 1200   CC: COVID symptoms  SUBJECTIVE: History from: patient.  Matthew Osborne is a 45 y.o. male who presents with cough, congestion, sore throat, and body aches x 2 days.  Denies sick exposure to COVID, flu or strep.  Test positive at home for covid. Denies alleviating factors.  Symptoms are made worse with swallowing.  Denies previous symptoms in the past.   Denies fever, SOB, wheezing, chest pain, changes in bowel or bladder habits.    Hx significant for cancer.  Currently undergoing chemo therapy for liver cancer.    ROS: As per HPI.  All other pertinent ROS negative.     Past Medical History:  Diagnosis Date   Cancer ([place])    De Quervain's tenosynovitis, left 03/2011   Diabetes mellitus    NIDDM   Family history of breast cancer    Family history of Hodgkin's lymphoma    Hyperlipidemia    Hypertension    under control; has been on med. x 4 yrs.   Nonproliferative retinopathy due to secondary diabetes [facility])    Past Surgical History:  Procedure Laterality Date   CARPAL TUNNEL RELEASE     bilat.   DORSAL COMPARTMENT RELEASE  03/14/2011   Procedure: RELEASE DORSAL COMPARTMENT (DEQUERVAIN);  Surgeon: Paulene Floor, MD;  Location: [place];  Service: Orthopedics;  Laterality: Left;  left wrist APL and EPL tenosynovectomy and 1st dorsal compartment release   IR IMAGING GUIDED PORT INSERTION  02/01/2020   TONSILLECTOMY AND ADENOIDECTOMY  as a child   VASECTOMY     Allergies  Allergen Reactions   Niaspan [Niacin]     Flushing    No current facility-administered medications on file prior to encounter.   Current Outpatient Medications on File Prior to Encounter  Medication Sig Dispense Refill   bisoprolol-hydrochlorothiazide (ZIAC) 10-6.25 MG tablet TAKE 1 TABLET BY MOUTH EVERY DAY IN THE MORNING (Patient taking differently: Take 1 tablet by mouth daily.) 90 tablet 3   blood glucose meter kit  and supplies KIT Fasting prior to each meal three times a day 1 each 0   chlorpheniramine-HYDROcodone (TUSSIONEX PENNKINETIC ER) 10-8 MG/5ML SUER Take 5 mLs by mouth every 12 (twelve) hours as needed for cough. (Patient not taking: Reported on 07/10/2020) 140 mL 0   Continuous Blood Gluc Receiver (DEXCOM G6 RECEIVER) DEVI USE AS DIRECTED 1 each 0   Continuous Blood Gluc Sensor (DEXCOM G6 SENSOR) MISC Use as directed to check blood sugar 5-6 times daily. 9 each 1   Continuous Blood Gluc Transmit (DEXCOM G6 TRANSMITTER) MISC USE AS DIRECTED TO CHECK BLOOD SUGAR 5-6 TIMES DAILY 1 each 1   glucose blood test strip Dispense based on patient and insurance preference.  Use twice daily as directed. (FOR ICD-10 E11.65) 100 each 11   Insulin Glargine-yfgn (SEMGLEE, YFGN,) 100 UNIT/ML SOPN Inject 70 Units into the skin 2 (two) times daily. 30 mL 11   insulin lispro (HUMALOG) 100 UNIT/ML KwikPen CHECK BG PRIOR TO MEALS & IF BG IS 150-200 2 UNITS,BG 201-250 4 UNIT,251-300 6 U,301-350 8 U, 351-400 10 U. OVER 401 12 UNIT(S) & CALL MD 15 mL 11   Insulin Pen Needle 31G X 5 MM MISC USE 4 PER DAY TO INJECT HUMALOG KWIKPEN & BASAGLAR. 100 each 11   Insulin Syringe 27G X 1/2" 0.5 ML MISC Use as directed to inject insulin SQ Q1D. 100 each 3   Lancets Thin  MISC Check BS BID 100 each 5   lidocaine-prilocaine (EMLA) cream APPLY 1 APPLICATION TOPICALLY AS DIRECTED. APPLY TO PORT SITE 1-2 HOURS PRIOR TO STICK AND COVER WITH PLASTIC WRAP. 30 g 2   ondansetron (ZOFRAN) 8 MG tablet TAKE 1 TABLET BY MOUTH EVERY 8 HOURS AS NEEDED FOR NAUSEA OR VOMITING. BEGIN 72 HOURS AFTER IV CHEMOTHERAPY TREATMENT (Patient not taking: Reported on 07/10/2020) 30 tablet 1   oxyCODONE (OXY IR/ROXICODONE) 5 MG immediate release tablet TAKE 1 TABLET BY MOUTH EVERY 6 HOURS AS NEEDED FOR SEVERE PAIN. (Patient not taking: Reported on 07/10/2020) 10 tablet 0   prochlorperazine (COMPAZINE) 10 MG tablet TAKE 1 TABLET BY MOUTH EVERY 6 HOURS AS NEEDED FOR NAUSEA 60  tablet 1   traMADol (ULTRAM) 50 MG tablet TAKE 1 TABLET BY MOUTH EVERY 6 HOURS AS NEEDED FOR MODERATE PAIN (COUGH). 30 tablet 0   TRUEPLUS LANCETS 33G MISC 1 each by Does not apply route 2 (two) times daily. 100 each 11   [DISCONTINUED] Insulin Glargine (BASAGLAR KWIKPEN) 100 UNIT/ML INJECT 70 UNITS INTO THE SKIN TWICE A DAY 135 mL 3   Social History   Socioeconomic History   Marital status: Married    Spouse name: Not on file   Number of children: Not on file   Years of education: Not on file   Highest education level: Not on file  Occupational History   Not on file  Tobacco Use   Smoking status: Former    Packs/day: 3.00    Years: 6.00    Pack years: 18.00    Types: Cigarettes    Quit date: 02/05/1999    Years since quitting: 21.4   Smokeless tobacco: Never  Vaping Use   Vaping Use: Never used  Substance and Sexual Activity   Alcohol use: No    Alcohol/week: 0.0 standard drinks   Drug use: No   Sexual activity: Not on file  Other Topics Concern   Not on file  Social History Narrative   Not on file   Social Determinants of Health   Financial Resource Strain: Low Risk    Difficulty of Paying Living Expenses: Not hard at all  Food Insecurity: No Food Insecurity   Worried About Charity fundraiser in the Last Year: Never true   [place] in the Last Year: Never true  Transportation Needs: No Transportation Needs   Lack of Transportation (Medical): No   Lack of Transportation (Non-Medical): No  Physical Activity: Not on file  Stress: Stress Concern Present   Feeling of Stress : To some extent  Social Connections: Engineer, building services of Communication with Friends and Family: More than three times a week   Frequency of Social Gatherings with Friends and Family: More than three times a week   Attends Religious Services: More than 4 times per year   Active Member of [employer] or Organizations: Yes   Attends Music therapist: More than 4 times  per year   Marital Status: Married  Human resources officer Violence: Not on file   Family History  Problem Relation Age of Onset   Cancer Other    Hyperlipidemia Other    Hypertension Brother    Asthma Son    Lung cancer Maternal Grandfather    Mesothelioma Maternal Grandfather        asbestos exposure   Rheum arthritis Maternal Grandmother    Breast cancer Maternal Grandmother 16   Hodgkin's lymphoma Mother 48  30 lb tumor removed from abdomen   Cirrhosis Paternal Grandmother    Other Maternal Great-grandfather        black lung, worked in [employer] (MGF's father)    OBJECTIVE:  Vitals:   07/22/20 1215  BP: 114/81  Pulse: 95  Resp: (!) 22  Temp: 99.3 F (37.4 C)  SpO2: 95%     General appearance: alert; appears fatigued, but nontoxic; speaking in full sentences and tolerating own secretions HEENT: NCAT; Ears: EACs clear, TMs pearly gray; Eyes: PERRL.  EOM grossly intact. Nose: nares patent without rhinorrhea, Throat: oropharynx clear, tonsils with erythematous or enlarged, uvula midline  Neck: supple without LAD Lungs: unlabored respirations, symmetrical air entry; cough: mild; no respiratory distress; CTAB Heart: regular rate and rhythm.   Skin: warm and dry Psychological: alert and cooperative; anxious and upset mood and affect  ASSESSMENT & PLAN:  1. Flu-like symptoms    Patient would like further work-up and evaluation and management in the ED for covid with current history of liver cancer.  Undergoing chemotherapy.  Patient stable at discharge.            Lestine Box, PA-C 07/22/20 1310

## 2020-07-23 ENCOUNTER — Other Ambulatory Visit: Payer: Self-pay | Admitting: Oncology

## 2020-07-24 ENCOUNTER — Inpatient Hospital Stay: Payer: No Typology Code available for payment source

## 2020-07-24 ENCOUNTER — Inpatient Hospital Stay: Payer: No Typology Code available for payment source | Admitting: Oncology

## 2020-07-24 ENCOUNTER — Ambulatory Visit: Payer: No Typology Code available for payment source | Admitting: Oncology

## 2020-07-24 NOTE — Progress Notes (Deleted)
[place] OFFICE PROGRESS NOTE   Diagnosis:   INTERVAL HISTORY:    Objective:  Vital signs in last 24 hours:  There were no vitals taken for this visit.    HEENT: *** Lymphatics: *** Resp: *** Cardio: *** GI: *** Vascular: *** Neuro:***  Skin:***   Portacath/PICC-without erythema  Lab Results:  Lab Results  Component Value Date   WBC 12.3 (H) 07/22/2020   HGB 11.7 (L) 07/22/2020   HCT 34.9 (L) 07/22/2020   MCV 102.3 (H) 07/22/2020   PLT 55 (L) 07/22/2020   NEUTROABS 9.9 (H) 07/22/2020    CMP  Lab Results  Component Value Date   NA 127 (L) 07/22/2020   K 4.1 07/22/2020   CL 94 (L) 07/22/2020   CO2 26 07/22/2020   GLUCOSE 218 (H) 07/22/2020   BUN 11 07/22/2020   CREATININE 0.92 07/22/2020   CALCIUM 8.5 (L) 07/22/2020   PROT 6.7 07/22/2020   ALBUMIN 3.5 07/22/2020   AST 52 (H) 07/22/2020   ALT 31 07/22/2020   ALKPHOS 158 (H) 07/22/2020   BILITOT 1.0 07/22/2020   GFRNONAA >60 07/22/2020   GFRAA 127 01/11/2020    Lab Results  Component Value Date   CEA1 2.15 01/14/2020    Lab Results  Component Value Date   INR 1.1 01/19/2020    Imaging:  DG Chest Portable 1 View  Result Date: 07/22/2020 CLINICAL DATA:  Productive cough.  COVID positive. EXAM: PORTABLE CHEST 1 VIEW COMPARISON:  01/01/2020, chest CT 06/06/2020 FINDINGS: Right chest port in place with tip in the mid SVC. Lung volumes are low. Upper normal heart size is likely accentuated by portable technique. No confluent or acute airspace disease. No pleural fluid or pneumothorax. No pulmonary edema. IMPRESSION: Low lung volumes. No evidence of pneumonia. Electronically Signed   By: Keith Rake M.D.   On: 07/22/2020 16:46    Medications: I have reviewed the patient's current medications.   Assessment/Plan: Cholangiocarcinoma  multiple liver masses and abdominal lymphadenopathy CTs 01/13/2020-rounded hypodense mass appears to arise from the pancreas neck, multiple  rim-enhancing masses in the liver, primarily left liver with segmental dilation of the left lobe Intermatic bile ducts, ill-defined hypodensity the central liver with effacement of the left portal vein, enlarged portacaval and retroperitoneal lymph nodes Ultrasound-guided biopsy of the left liver lesion 01/19/2020-adenocarcinoma, cytokeratin 7+, MSS, tumor mutation burden 1, IDH1 R132C MRI abdomen 01/31/2020-poorly marginated central liver mass, multiple smaller similar satellite liver masses, extrinsic mass-effect at the biliary hilum with intrahepatic biliary ductal dilatation throughout the left liver with mild Intermatic dilatation the superior right liver, no pancreas mass or ductal dilatation, normal spleen size, numerous enlarged enhancing lymph nodes at the porta hepatis, peripancreatic, portacaval, aortocaval, left periaortic chains Cycle 1 gemcitabine/cisplatin 02/04/2020 Cycle 2 gemcitabine/cisplatin 02/28/2020 Cycle 3 gemcitabine/cisplatin 03/20/2020 CT abdomen/pelvis 03/31/2020-mild decrease in central liver tumor, mild decrease in size of liver metastases and upper abdominal adenopathy, new splenomegaly Cycle 4 gemcitabine/cisplatin 04/10/2020 Cycle 5 gemcitabine/cisplatin 05/01/2020 Cycle 6 gemcitabine/cisplatin plus durvalumab 05/22/2020 CTs 06/06/2020- dominant central liver mass mildly enlarged, other liver lesions and abdominal adenopathy is stable, no evidence of metastatic disease to the chest Cycle 7 gemcitabine/cisplatin plus Durvalumab 06/12/2020 Cycle 8 gemcitabine/cisplatin plus Durvalumab 06/30/2020   Cough-likely related to diaphragmatic irritation from #1 Anorexia/weight loss Hypercalcemia-likely hypercalcemia malignancy, status post intravenous hydration and Zometa 01/14/2020 Diabetes Hyperlipidemia Hypertension History of peripheral neuropathy secondary to diabetes Severe back pain following Fulphila-oxycodone prescribed Thrombocytopenia secondary to chemotherapy-gemcitabine  held with day 1 cycle  8 and then dose reduced        Disposition:  Betsy Coder, MD  07/24/2020  7:08 AM

## 2020-07-25 ENCOUNTER — Other Ambulatory Visit (HOSPITAL_BASED_OUTPATIENT_CLINIC_OR_DEPARTMENT_OTHER): Payer: Self-pay

## 2020-07-27 ENCOUNTER — Encounter: Payer: Self-pay | Admitting: General Practice

## 2020-07-27 NOTE — Progress Notes (Signed)
[facility] Spiritual Care Note  Followed up with Matthew Osborne by phone for pastoral check-in after his [place] vacation. He used the opportunity to process his recent experience with covid and how he is coping with cancer. He reports gratitude for being able to live his day-to-day life fairly well in between treatments, which is a helpful coping tool. 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  Marquavion is aware of ongoing chaplain availability by phone at the [facility]. Plan to follow up by phone next month.   [place], [place], [facility] Pager [phone] Voicemail [phone]

## 2020-07-28 ENCOUNTER — Other Ambulatory Visit (HOSPITAL_BASED_OUTPATIENT_CLINIC_OR_DEPARTMENT_OTHER): Payer: Self-pay

## 2020-07-28 ENCOUNTER — Encounter: Payer: Self-pay | Admitting: Oncology

## 2020-07-28 MED FILL — Insulin Pen Needle 31 G X 5 MM (1/5" or 3/16"): 25 days supply | Qty: 100 | Fill #1 | Status: AC

## 2020-07-30 ENCOUNTER — Other Ambulatory Visit: Payer: Self-pay | Admitting: Oncology

## 2020-07-31 ENCOUNTER — Inpatient Hospital Stay (HOSPITAL_BASED_OUTPATIENT_CLINIC_OR_DEPARTMENT_OTHER): Payer: No Typology Code available for payment source | Admitting: Oncology

## 2020-07-31 ENCOUNTER — Inpatient Hospital Stay: Payer: No Typology Code available for payment source

## 2020-07-31 ENCOUNTER — Other Ambulatory Visit: Payer: Self-pay

## 2020-07-31 VITALS — BP 144/87 | HR 61 | Temp 98.0°F | Resp 20 | Ht 74.0 in | Wt 238.9 lb

## 2020-07-31 VITALS — BP 140/83 | HR 62 | Temp 97.9°F | Resp 18

## 2020-07-31 DIAGNOSIS — C221 Intrahepatic bile duct carcinoma: Secondary | ICD-10-CM

## 2020-07-31 DIAGNOSIS — Z5111 Encounter for antineoplastic chemotherapy: Secondary | ICD-10-CM | POA: Diagnosis not present

## 2020-07-31 LAB — CMP (CANCER CENTER ONLY)
ALT: 24 U/L (ref 0–44)
AST: 35 U/L (ref 15–41)
Albumin: 4 g/dL (ref 3.5–5.0)
Alkaline Phosphatase: 120 U/L (ref 38–126)
Anion gap: 6 (ref 5–15)
BUN: 14 mg/dL (ref 6–20)
CO2: 29 mmol/L (ref 22–32)
Calcium: 9.6 mg/dL (ref 8.9–10.3)
Chloride: 100 mmol/L (ref 98–111)
Creatinine: 0.84 mg/dL (ref 0.61–1.24)
GFR, Estimated: >60 mL/min (ref >60)
Glucose, Bld: 202 mg/dL — ABNORMAL HIGH (ref 70–99)
Potassium: 4.5 mmol/L (ref 3.5–5.1)
Sodium: 135 mmol/L (ref 135–145)
Total Bilirubin: 0.6 mg/dL (ref 0.3–1.2)
Total Protein: 7 g/dL (ref 6.5–8.1)

## 2020-07-31 LAB — CBC WITH DIFFERENTIAL (CANCER CENTER ONLY)
Abs Immature Granulocytes: 0.01 10*3/uL (ref 0.00–0.07)
Basophils Absolute: 0.1 10*3/uL (ref 0.0–0.1)
Basophils Relative: 1 %
Eosinophils Absolute: 0.6 10*3/uL — ABNORMAL HIGH (ref 0.0–0.5)
Eosinophils Relative: 11 %
HCT: 36.2 % — ABNORMAL LOW (ref 39.0–52.0)
Hemoglobin: 12 g/dL — ABNORMAL LOW (ref 13.0–17.0)
Immature Granulocytes: 0 %
Lymphocytes Relative: 19 %
Lymphs Abs: 1 10*3/uL (ref 0.7–4.0)
MCH: 34 pg (ref 26.0–34.0)
MCHC: 33.1 g/dL (ref 30.0–36.0)
MCV: 102.5 fL — ABNORMAL HIGH (ref 80.0–100.0)
Monocytes Absolute: 0.7 10*3/uL (ref 0.1–1.0)
Monocytes Relative: 14 %
Neutro Abs: 2.9 10*3/uL (ref 1.7–7.7)
Neutrophils Relative %: 55 %
Platelet Count: 286 10*3/uL (ref 150–400)
RBC: 3.53 MIL/uL — ABNORMAL LOW (ref 4.22–5.81)
RDW: 13.8 % (ref 11.5–15.5)
WBC Count: 5.3 10*3/uL (ref 4.0–10.5)
nRBC: 0 % (ref 0.0–0.2)

## 2020-07-31 LAB — MAGNESIUM: Magnesium: 2 mg/dL (ref 1.7–2.4)

## 2020-07-31 MED ORDER — DEXAMETHASONE SODIUM PHOSPHATE 10 MG/ML IJ SOLN
5.0000 mg | Freq: Once | INTRAMUSCULAR | Status: AC
  Administered 2020-07-31: 5 mg via INTRAVENOUS
  Filled 2020-07-31: qty 1

## 2020-07-31 MED ORDER — SODIUM CHLORIDE 0.9 % IV SOLN
Freq: Once | INTRAVENOUS | Status: AC
  Filled 2020-07-31: qty 250

## 2020-07-31 MED ORDER — MAGNESIUM SULFATE 2 GM/50ML IV SOLN
2.0000 g | Freq: Once | INTRAVENOUS | Status: AC
  Administered 2020-07-31: 2 g via INTRAVENOUS
  Filled 2020-07-31: qty 50

## 2020-07-31 MED ORDER — POTASSIUM CHLORIDE IN NACL 20-0.9 MEQ/L-% IV SOLN
Freq: Once | INTRAVENOUS | Status: AC
  Filled 2020-07-31: qty 1000

## 2020-07-31 MED ORDER — SODIUM CHLORIDE 0.9 % IV SOLN
800.0000 mg/m2 | Freq: Once | INTRAVENOUS | Status: AC
  Administered 2020-07-31: 1786 mg via INTRAVENOUS
  Filled 2020-07-31: qty 46.97

## 2020-07-31 MED ORDER — FAMOTIDINE 20 MG IN NS 100 ML IVPB
20.0000 mg | Freq: Once | INTRAVENOUS | Status: AC
Start: 2020-07-31 — End: 2020-07-31
  Administered 2020-07-31: 20 mg via INTRAVENOUS
  Filled 2020-07-31: qty 100

## 2020-07-31 MED ORDER — SODIUM CHLORIDE 0.9 % IV SOLN
150.0000 mg | Freq: Once | INTRAVENOUS | Status: AC
  Administered 2020-07-31: 150 mg via INTRAVENOUS
  Filled 2020-07-31: qty 5

## 2020-07-31 MED ORDER — SODIUM CHLORIDE 0.9 % IV SOLN
25.0000 mg/m2 | Freq: Once | INTRAVENOUS | Status: AC
  Administered 2020-07-31: 56 mg via INTRAVENOUS
  Filled 2020-07-31: qty 56

## 2020-07-31 MED ORDER — SODIUM CHLORIDE 0.9% FLUSH
10.0000 mL | INTRAVENOUS | Status: DC | PRN
Start: 2020-07-31 — End: 2020-07-31
  Administered 2020-07-31: 10 mL
  Filled 2020-07-31: qty 10

## 2020-07-31 MED ORDER — HEPARIN SOD (PORK) LOCK FLUSH 100 UNIT/ML IV SOLN
500.0000 [IU] | Freq: Once | INTRAVENOUS | Status: AC | PRN
  Administered 2020-07-31: 500 [IU]
  Filled 2020-07-31: qty 5

## 2020-07-31 MED ORDER — PALONOSETRON HCL INJECTION 0.25 MG/5ML
0.2500 mg | Freq: Once | INTRAVENOUS | Status: AC
Start: 2020-07-31 — End: 2020-07-31
  Administered 2020-07-31: 0.25 mg via INTRAVENOUS
  Filled 2020-07-31: qty 5

## 2020-07-31 NOTE — Progress Notes (Signed)
[facility] OFFICE PROGRESS NOTE   Diagnosis: Cholangiocarcinoma  INTERVAL HISTORY:   Matthew Osborne completed another cycle of gemcitabine/cisplatin and Durvalumab beginning 06/30/2020.  No nausea/vomiting.  No pain.  He has noted mild progression of neuropathy in the feet.  This does not interfere with activity. He recently returned from a vacation to [place].  He was diagnosed with COVID-19 infection on 07/22/2020.  He was seen in the emergency room and was treated with Paxil of it.  He reports his symptoms resolved the following day.  Objective:  Vital signs in last 24 hours:  Blood pressure (!) 144/87, pulse 61, temperature 98 F (36.7 C), temperature source Oral, resp. rate 20, height 6' 2"  (1.88 m), weight 238 lb 14.4 oz (108.4 kg), SpO2 100 %.    HEENT: No thrush or ulcers Resp: Lungs clear bilaterally Cardio: Regular rate and rhythm GI: Nontender, no hepatosplenomegaly Vascular: No leg edema  Skin: Palms without erythema  Portacath/PICC-without erythema  Lab Results:  Lab Results  Component Value Date   WBC 5.3 07/31/2020   HGB 12.0 (L) 07/31/2020   HCT 36.2 (L) 07/31/2020   MCV 102.5 (H) 07/31/2020   PLT 286 07/31/2020   NEUTROABS 2.9 07/31/2020    CMP  Lab Results  Component Value Date   NA 135 07/31/2020   K 4.5 07/31/2020   CL 100 07/31/2020   CO2 29 07/31/2020   GLUCOSE 202 (H) 07/31/2020   BUN 14 07/31/2020   CREATININE 0.84 07/31/2020   CALCIUM 9.6 07/31/2020   PROT 7.0 07/31/2020   ALBUMIN 4.0 07/31/2020   AST 35 07/31/2020   ALT 24 07/31/2020   ALKPHOS 120 07/31/2020   BILITOT 0.6 07/31/2020   GFRNONAA >60 07/31/2020   GFRAA 127 01/11/2020    Lab Results  Component Value Date   CEA1 2.15 01/14/2020    Medications: I have reviewed the patient's current medications.   Assessment/Plan: Cholangiocarcinoma  multiple liver masses and abdominal lymphadenopathy CTs 01/13/2020-rounded hypodense mass appears to arise from the  pancreas neck, multiple rim-enhancing masses in the liver, primarily left liver with segmental dilation of the left lobe Intermatic bile ducts, ill-defined hypodensity the central liver with effacement of the left portal vein, enlarged portacaval and retroperitoneal lymph nodes Ultrasound-guided biopsy of the left liver lesion 01/19/2020-adenocarcinoma, cytokeratin 7+, MSS, tumor mutation burden 1, IDH1 R132C MRI abdomen 01/31/2020-poorly marginated central liver mass, multiple smaller similar satellite liver masses, extrinsic mass-effect at the biliary hilum with intrahepatic biliary ductal dilatation throughout the left liver with mild Intermatic dilatation the superior right liver, no pancreas mass or ductal dilatation, normal spleen size, numerous enlarged enhancing lymph nodes at the porta hepatis, peripancreatic, portacaval, aortocaval, left periaortic chains Cycle 1 gemcitabine/cisplatin 02/04/2020 Cycle 2 gemcitabine/cisplatin 02/28/2020 Cycle 3 gemcitabine/cisplatin 03/20/2020 CT abdomen/pelvis 03/31/2020-mild decrease in central liver tumor, mild decrease in size of liver metastases and upper abdominal adenopathy, new splenomegaly Cycle 4 gemcitabine/cisplatin 04/10/2020 Cycle 5 gemcitabine/cisplatin 05/01/2020 Cycle 6 gemcitabine/cisplatin plus durvalumab 05/22/2020 CTs 06/06/2020- dominant central liver mass mildly enlarged, other liver lesions and abdominal adenopathy is stable, no evidence of metastatic disease to the chest Cycle 7 gemcitabine/cisplatin plus Durvalumab 06/12/2020 Cycle 8 gemcitabine/cisplatin plus Durvalumab 06/30/2020 Cycle 9 gemcitabine/cisplatin plus Durvalumab 07/31/2020   Cough-likely related to diaphragmatic irritation from #1 Anorexia/weight loss Hypercalcemia-likely hypercalcemia malignancy, status post intravenous hydration and Zometa 01/14/2020 Diabetes Hyperlipidemia Hypertension History of peripheral neuropathy secondary to diabetes Severe back pain following  Fulphila-oxycodone prescribed Thrombocytopenia secondary to chemotherapy-gemcitabine held with day 1 cycle  8 and then dose reduced COVID-19 infection 07/22/2020      Disposition: Matthew Osborne appears stable.  He has completed 8 cycles of systemic therapy.  He will complete cycle 9 beginning today.  He will undergo a restaging CT evaluation after this cycle.  Matthew Osborne was diagnosed with COVID-19 infection on 07/22/2020.  He completed a course of Paxlovid.  His symptoms resolved.  He will return for an office visit and review of restaging CTs in 3 weeks.  Betsy Coder, MD  07/31/2020  8:52 AM

## 2020-07-31 NOTE — Patient Instructions (Signed)
[facility]   Discharge Instructions: Thank you for choosing [facility] to provide your oncology and hematology care.   If you have a lab appointment with the [facility], please go directly to the [facility] and check in at the registration area.   Wear comfortable clothing and clothing appropriate for easy access to any Portacath or PICC line.   We strive to give you quality time with your provider. You may need to reschedule your appointment if you arrive late (15 or more minutes).  Arriving late affects you and other patients whose appointments are after yours.  Also, if you miss three or more appointments without notifying the office, you may be dismissed from the clinic at the provider's discretion.      For prescription refill requests, have your pharmacy contact our office and allow 72 hours for refills to be completed.    Today you received the following chemotherapy and/or immunotherapy agents Gemcitabine (GEMZAR) & Cisplatin (PLATINOL).      To help prevent nausea and vomiting after your treatment, we encourage you to take your nausea medication as directed.  BELOW ARE SYMPTOMS THAT SHOULD BE REPORTED IMMEDIATELY: *FEVER GREATER THAN 100.4 F (38 C) OR HIGHER *CHILLS OR SWEATING *NAUSEA AND VOMITING THAT IS NOT CONTROLLED WITH YOUR NAUSEA MEDICATION *UNUSUAL SHORTNESS OF BREATH *UNUSUAL BRUISING OR BLEEDING *URINARY PROBLEMS (pain or burning when urinating, or frequent urination) *BOWEL PROBLEMS (unusual diarrhea, constipation, pain near the anus) TENDERNESS IN MOUTH AND THROAT WITH OR WITHOUT PRESENCE OF ULCERS (sore throat, sores in mouth, or a toothache) UNUSUAL RASH, SWELLING OR PAIN  UNUSUAL VAGINAL DISCHARGE OR ITCHING   Items with * indicate a potential emergency and should be followed up as soon as possible or go to the Emergency Department if any problems should occur.  Please show the CHEMOTHERAPY ALERT CARD or  IMMUNOTHERAPY ALERT CARD at check-in to the Emergency Department and triage nurse.  Should you have questions after your visit or need to cancel or reschedule your appointment, please contact [facility]  Dept: [phone]  and follow the prompts.  Office hours are 8:00 a.m. to 4:30 p.m. Monday - Friday. Please note that voicemails left after 4:00 p.m. may not be returned until the following business day.  We are closed weekends and major holidays. You have access to a nurse at all times for urgent questions. Please call the main number to the clinic Dept: [phone] and follow the prompts.   For any non-urgent questions, you may also contact your provider using MyChart. We now offer e-Visits for anyone 30 and older to request care online for non-urgent symptoms. For details visit [email or web address].   Also download the MyChart app! Go to the app store, search "MyChart", open the app, select , and log in with your MyChart username and password.  Due to Covid, a mask is required upon entering the hospital/clinic. If you do not have a mask, one will be given to you upon arrival. For doctor visits, patients may have 1 support person aged 42 or older with them. For treatment visits, patients cannot have anyone with them due to current Covid guidelines and our immunocompromised population.   Gemcitabine injection What is this medication? GEMCITABINE (jem SYE ta been) is a chemotherapy drug. This medicine is used to treat many types of cancer like breast cancer, lung cancer, pancreatic cancer,and ovarian cancer. This medicine may be used for other purposes; ask your  health care provider orpharmacist if you have questions. COMMON BRAND NAME(S): Gemzar, Infugem What should I tell my care team before I take this medication? They need to know if you have any of these conditions: blood disorders infection kidney disease liver disease lung or breathing disease,  like asthma recent or ongoing radiation therapy an unusual or allergic reaction to gemcitabine, other chemotherapy, other medicines, foods, dyes, or preservatives pregnant or trying to get pregnant breast-feeding How should I use this medication? This drug is given as an infusion into a vein. It is administered in a hospitalor clinic by a specially trained health care professional. Talk to your pediatrician regarding the use of this medicine in children.Special care may be needed. Overdosage: If you think you have taken too much of this medicine contact apoison control center or emergency room at once. NOTE: This medicine is only for you. Do not share this medicine with others. What if I miss a dose? It is important not to miss your dose. Call your doctor or health careprofessional if you are unable to keep an appointment. What may interact with this medication? medicines to increase blood counts like filgrastim, pegfilgrastim, sargramostim some other chemotherapy drugs like cisplatin vaccines Talk to your doctor or health care professional before taking any of thesemedicines: acetaminophen aspirin ibuprofen ketoprofen naproxen This list may not describe all possible interactions. Give your health care provider a list of all the medicines, herbs, non-prescription drugs, or dietary supplements you use. Also tell them if you smoke, drink alcohol, or use illegaldrugs. Some items may interact with your medicine. What should I watch for while using this medication? Visit your doctor for checks on your progress. This drug may make you feel generally unwell. This is not uncommon, as chemotherapy can affect healthy cells as well as cancer cells. Report any side effects. Continue your course oftreatment even though you feel ill unless your doctor tells you to stop. In some cases, you may be given additional medicines to help with side effects.Follow all directions for their use. Call your doctor or  health care professional for advice if you get a fever, chills or sore throat, or other symptoms of a cold or flu. Do not treat yourself. This drug decreases your body's ability to fight infections. Try toavoid being around people who are sick. This medicine may increase your risk to bruise or bleed. Call your doctor orhealth care professional if you notice any unusual bleeding. Be careful brushing and flossing your teeth or using a toothpick because you may get an infection or bleed more easily. If you have any dental work done,tell your dentist you are receiving this medicine. Avoid taking products that contain aspirin, acetaminophen, ibuprofen, naproxen, or ketoprofen unless instructed by your doctor. These medicines may hide afever. Do not become pregnant while taking this medicine or for 6 months after stopping it. Women should inform their doctor if they wish to become pregnant or think they might be pregnant. Men should not father a child while taking this medicine and for 3 months after stopping it. There is a potential for serious side effects to an unborn child. Talk to your health care professional or pharmacist for more information. Do not breast-feed an infant while takingthis medicine or for at least 1 week after stopping it. Men should inform their doctors if they wish to father a child. This medicine may lower sperm counts. Talk with your doctor or health care professional ifyou are concerned about your fertility. What side effects  may I notice from receiving this medication? Side effects that you should report to your doctor or health care professionalas soon as possible: allergic reactions like skin rash, itching or hives, swelling of the face, lips, or tongue breathing problems pain, redness, or irritation at site where injected signs and symptoms of a dangerous change in heartbeat or heart rhythm like chest pain; dizziness; fast or irregular heartbeat; palpitations; feeling faint or  lightheaded, falls; breathing problems signs of decreased platelets or bleeding - bruising, pinpoint red spots on the skin, black, tarry stools, blood in the urine signs of decreased red blood cells - unusually weak or tired, feeling faint or lightheaded, falls signs of infection - fever or chills, cough, sore throat, pain or difficulty passing urine signs and symptoms of kidney injury like trouble passing urine or change in the amount of urine signs and symptoms of liver injury like dark yellow or brown urine; general ill feeling or flu-like symptoms; light-colored stools; loss of appetite; nausea; right upper belly pain; unusually weak or tired; yellowing of the eyes or skin swelling of ankles, feet, hands Side effects that usually do not require medical attention (report to yourdoctor or health care professional if they continue or are bothersome): constipation diarrhea hair loss loss of appetite nausea rash vomiting This list may not describe all possible side effects. Call your doctor for medical advice about side effects. You may report side effects to FDA at1-800-FDA-1088. Where should I keep my medication? This drug is given in a hospital or clinic and will not be stored at home. NOTE: This sheet is a summary. It may not cover all possible information. If you have questions about this medicine, talk to your doctor, pharmacist, orhealth care provider.  2022 Elsevier/Gold Standard (2017-04-16 18:06:11)  Cisplatin injection What is this medication? CISPLATIN (SIS pla tin) is a chemotherapy drug. It targets fast dividing cells, like cancer cells, and causes these cells to die. This medicine is used totreat many types of cancer like bladder, ovarian, and testicular cancers. This medicine may be used for other purposes; ask your health care provider orpharmacist if you have questions. COMMON BRAND NAME(S): Platinol, Platinol -AQ What should I tell my care team before I take this  medication? They need to know if you have any of these conditions: eye disease, vision problems hearing problems kidney disease low blood counts, like white cells, platelets, or red blood cells tingling of the fingers or toes, or other nerve disorder an unusual or allergic reaction to cisplatin, carboplatin, oxaliplatin, other medicines, foods, dyes, or preservatives pregnant or trying to get pregnant breast-feeding How should I use this medication? This drug is given as an infusion into a vein. It is administered in a hospitalor clinic by a specially trained health care professional. Talk to your pediatrician regarding the use of this medicine in children.Special care may be needed. Overdosage: If you think you have taken too much of this medicine contact apoison control center or emergency room at once. NOTE: This medicine is only for you. Do not share this medicine with others. What if I miss a dose? It is important not to miss a dose. Call your doctor or health careprofessional if you are unable to keep an appointment. What may interact with this medication? This medicine may interact with the following medications: foscarnet certain antibiotics like amikacin, gentamicin, neomycin, polymyxin B, streptomycin, tobramycin, vancomycin This list may not describe all possible interactions. Give your health care provider a list of all the medicines,  herbs, non-prescription drugs, or dietary supplements you use. Also tell them if you smoke, drink alcohol, or use illegaldrugs. Some items may interact with your medicine. What should I watch for while using this medication? Your condition will be monitored carefully while you are receiving this medicine. You will need important blood work done while you are taking thismedicine. This drug may make you feel generally unwell. This is not uncommon, as chemotherapy can affect healthy cells as well as cancer cells. Report any side effects. Continue your  course of treatment even though you feel ill unless yourdoctor tells you to stop. This medicine may increase your risk of getting an infection. Call your healthcare professional for advice if you get a fever, chills, or sore throat, or other symptoms of a cold or flu. Do not treat yourself. Try to avoid beingaround people who are sick. Avoid taking medicines that contain aspirin, acetaminophen, ibuprofen, naproxen, or ketoprofen unless instructed by your healthcare professional.These medicines may hide a fever. This medicine may increase your risk to bruise or bleed. Call your doctor orhealth care professional if you notice any unusual bleeding. Be careful brushing and flossing your teeth or using a toothpick because you may get an infection or bleed more easily. If you have any dental work done,tell your dentist you are receiving this medicine. Do not become pregnant while taking this medicine or for 14 months after stopping it. Women should inform their healthcare professional if they wish to become pregnant or think they might be pregnant. Men should not father a child while taking this medicine and for 11 months after stopping it. There is potential for serious side effects to an unborn child. Talk to your healthcareprofessional for more information. Do not breast-feed an infant while taking this medicine. This medicine has caused ovarian failure in some women. This medicine may make it more difficult to get pregnant. Talk to your healthcare professional if [place] concerned about your fertility. This medicine has caused decreased sperm counts in some men. This may make it more difficult to father a child. Talk to your healthcare professional if [place] concerned about your fertility. Drink fluids as directed while you are taking this medicine. This will helpprotect your kidneys. Call your doctor or health care professional if you get diarrhea. Do not treatyourself. What side effects may I notice from  receiving this medication? Side effects that you should report to your doctor or health care professionalas soon as possible: allergic reactions like skin rash, itching or hives, swelling of the face, lips, or tongue blurred vision changes in vision decreased hearing or ringing of the ears nausea, vomiting pain, redness, or irritation at site where injected pain, tingling, numbness in the hands or feet signs and symptoms of bleeding such as bloody or black, tarry stools; red or dark brown urine; spitting up blood or brown material that looks like coffee grounds; red spots on the skin; unusual bruising or bleeding from the eyes, gums, or nose signs and symptoms of infection like fever; chills; cough; sore throat; pain or trouble passing urine signs and symptoms of kidney injury like trouble passing urine or change in the amount of urine signs and symptoms of low red blood cells or anemia such as unusually weak or tired; feeling faint or lightheaded; falls; breathing problems Side effects that usually do not require medical attention (report to yourdoctor or health care professional if they continue or are bothersome): loss of appetite mouth sores muscle cramps This list may not describe all  possible side effects. Call your doctor for medical advice about side effects. You may report side effects to FDA at1-800-FDA-1088. Where should I keep my medication? This drug is given in a hospital or clinic and will not be stored at home. NOTE: This sheet is a summary. It may not cover all possible information. If you have questions about this medicine, talk to your doctor, pharmacist, orhealth care provider.  2022 Elsevier/Gold Standard (2018-01-16 15:59:17)

## 2020-07-31 NOTE — Progress Notes (Signed)
Scheduled CT scan for 08/18/20 at 0945/1000. NPO after 6 am and drink oral contrast at 0800 and 0900. Location is [facility]. Confirmed he has his oral contrast. Patient notified.

## 2020-08-01 ENCOUNTER — Telehealth: Payer: Self-pay

## 2020-08-01 ENCOUNTER — Inpatient Hospital Stay: Payer: No Typology Code available for payment source

## 2020-08-01 NOTE — Telephone Encounter (Signed)
Pt did not receive udenyca injection today was not due for it. Spoke with Pt's wife confirmed injection was not to be given. Because of missed treatment due to Pt having Covid.

## 2020-08-04 ENCOUNTER — Other Ambulatory Visit (HOSPITAL_BASED_OUTPATIENT_CLINIC_OR_DEPARTMENT_OTHER): Payer: Self-pay

## 2020-08-07 ENCOUNTER — Other Ambulatory Visit: Payer: Self-pay | Admitting: Oncology

## 2020-08-08 ENCOUNTER — Other Ambulatory Visit: Payer: Self-pay

## 2020-08-08 ENCOUNTER — Inpatient Hospital Stay: Payer: No Typology Code available for payment source

## 2020-08-08 ENCOUNTER — Inpatient Hospital Stay: Payer: No Typology Code available for payment source | Attending: Oncology

## 2020-08-08 ENCOUNTER — Other Ambulatory Visit (HOSPITAL_BASED_OUTPATIENT_CLINIC_OR_DEPARTMENT_OTHER): Payer: Self-pay

## 2020-08-08 ENCOUNTER — Other Ambulatory Visit: Payer: Self-pay | Admitting: Oncology

## 2020-08-08 VITALS — BP 141/88 | HR 60 | Temp 98.4°F | Resp 18 | Ht 74.0 in | Wt 238.2 lb

## 2020-08-08 DIAGNOSIS — D6959 Other secondary thrombocytopenia: Secondary | ICD-10-CM | POA: Insufficient documentation

## 2020-08-08 DIAGNOSIS — Z5111 Encounter for antineoplastic chemotherapy: Secondary | ICD-10-CM | POA: Insufficient documentation

## 2020-08-08 DIAGNOSIS — I10 Essential (primary) hypertension: Secondary | ICD-10-CM | POA: Diagnosis not present

## 2020-08-08 DIAGNOSIS — C221 Intrahepatic bile duct carcinoma: Secondary | ICD-10-CM | POA: Diagnosis present

## 2020-08-08 DIAGNOSIS — Z79899 Other long term (current) drug therapy: Secondary | ICD-10-CM | POA: Insufficient documentation

## 2020-08-08 DIAGNOSIS — E1142 Type 2 diabetes mellitus with diabetic polyneuropathy: Secondary | ICD-10-CM | POA: Diagnosis not present

## 2020-08-08 DIAGNOSIS — T451X5A Adverse effect of antineoplastic and immunosuppressive drugs, initial encounter: Secondary | ICD-10-CM | POA: Insufficient documentation

## 2020-08-08 DIAGNOSIS — Z5112 Encounter for antineoplastic immunotherapy: Secondary | ICD-10-CM | POA: Diagnosis present

## 2020-08-08 LAB — MAGNESIUM: Magnesium: 1.9 mg/dL (ref 1.7–2.4)

## 2020-08-08 LAB — CBC WITH DIFFERENTIAL (CANCER CENTER ONLY)
Abs Immature Granulocytes: 0.01 10*3/uL (ref 0.00–0.07)
Basophils Absolute: 0.1 10*3/uL (ref 0.0–0.1)
Basophils Relative: 2 %
Eosinophils Absolute: 0.3 10*3/uL (ref 0.0–0.5)
Eosinophils Relative: 9 %
HCT: 32.9 % — ABNORMAL LOW (ref 39.0–52.0)
Hemoglobin: 10.9 g/dL — ABNORMAL LOW (ref 13.0–17.0)
Immature Granulocytes: 0 %
Lymphocytes Relative: 24 %
Lymphs Abs: 0.9 10*3/uL (ref 0.7–4.0)
MCH: 33.3 pg (ref 26.0–34.0)
MCHC: 33.1 g/dL (ref 30.0–36.0)
MCV: 100.6 fL — ABNORMAL HIGH (ref 80.0–100.0)
Monocytes Absolute: 0.5 10*3/uL (ref 0.1–1.0)
Monocytes Relative: 12 %
Neutro Abs: 2.1 10*3/uL (ref 1.7–7.7)
Neutrophils Relative %: 53 %
Platelet Count: 105 10*3/uL — ABNORMAL LOW (ref 150–400)
RBC: 3.27 MIL/uL — ABNORMAL LOW (ref 4.22–5.81)
RDW: 13.2 % (ref 11.5–15.5)
WBC Count: 3.9 10*3/uL — ABNORMAL LOW (ref 4.0–10.5)
nRBC: 0 % (ref 0.0–0.2)

## 2020-08-08 LAB — CMP (CANCER CENTER ONLY)
ALT: 34 U/L (ref 0–44)
AST: 36 U/L (ref 15–41)
Albumin: 3.9 g/dL (ref 3.5–5.0)
Alkaline Phosphatase: 92 U/L (ref 38–126)
Anion gap: 6 (ref 5–15)
BUN: 12 mg/dL (ref 6–20)
CO2: 28 mmol/L (ref 22–32)
Calcium: 9.1 mg/dL (ref 8.9–10.3)
Chloride: 100 mmol/L (ref 98–111)
Creatinine: 0.88 mg/dL (ref 0.61–1.24)
GFR, Estimated: >60 mL/min (ref >60)
Glucose, Bld: 260 mg/dL — ABNORMAL HIGH (ref 70–99)
Potassium: 4.2 mmol/L (ref 3.5–5.1)
Sodium: 134 mmol/L — ABNORMAL LOW (ref 135–145)
Total Bilirubin: 0.5 mg/dL (ref 0.3–1.2)
Total Protein: 6.8 g/dL (ref 6.5–8.1)

## 2020-08-08 MED ORDER — POTASSIUM CHLORIDE IN NACL 20-0.9 MEQ/L-% IV SOLN
Freq: Once | INTRAVENOUS | Status: AC
  Filled 2020-08-08: qty 1000

## 2020-08-08 MED ORDER — PALONOSETRON HCL INJECTION 0.25 MG/5ML
0.2500 mg | Freq: Once | INTRAVENOUS | Status: AC
  Administered 2020-08-08: 0.25 mg via INTRAVENOUS
  Filled 2020-08-08: qty 5

## 2020-08-08 MED ORDER — DEXAMETHASONE SODIUM PHOSPHATE 10 MG/ML IJ SOLN
5.0000 mg | Freq: Once | INTRAMUSCULAR | Status: AC
  Administered 2020-08-08: 5 mg via INTRAVENOUS
  Filled 2020-08-08: qty 1

## 2020-08-08 MED ORDER — SODIUM CHLORIDE 0.9 % IV SOLN
Freq: Once | INTRAVENOUS | Status: AC
  Filled 2020-08-08: qty 250

## 2020-08-08 MED ORDER — FAMOTIDINE 20 MG IN NS 100 ML IVPB
20.0000 mg | Freq: Once | INTRAVENOUS | Status: AC
  Administered 2020-08-08: 20 mg via INTRAVENOUS
  Filled 2020-08-08: qty 100

## 2020-08-08 MED ORDER — SODIUM CHLORIDE 0.9 % IV SOLN
800.0000 mg/m2 | Freq: Once | INTRAVENOUS | Status: AC
  Administered 2020-08-08: 1786 mg via INTRAVENOUS
  Filled 2020-08-08: qty 46.97

## 2020-08-08 MED ORDER — SODIUM CHLORIDE 0.9 % IV SOLN
1500.0000 mg | Freq: Once | INTRAVENOUS | Status: AC
  Administered 2020-08-08: 1500 mg via INTRAVENOUS
  Filled 2020-08-08: qty 30

## 2020-08-08 MED ORDER — SODIUM CHLORIDE 0.9 % IV SOLN
25.0000 mg/m2 | Freq: Once | INTRAVENOUS | Status: AC
  Administered 2020-08-08: 56 mg via INTRAVENOUS
  Filled 2020-08-08: qty 56

## 2020-08-08 MED ORDER — HEPARIN SOD (PORK) LOCK FLUSH 100 UNIT/ML IV SOLN
500.0000 [IU] | Freq: Once | INTRAVENOUS | Status: AC | PRN
  Administered 2020-08-08: 500 [IU]
  Filled 2020-08-08: qty 5

## 2020-08-08 MED ORDER — SODIUM CHLORIDE 0.9% FLUSH
10.0000 mL | INTRAVENOUS | Status: DC | PRN
  Administered 2020-08-08: 10 mL
  Filled 2020-08-08: qty 10

## 2020-08-08 MED ORDER — MAGNESIUM SULFATE 2 GM/50ML IV SOLN
2.0000 g | Freq: Once | INTRAVENOUS | Status: AC
  Administered 2020-08-08: 2 g via INTRAVENOUS
  Filled 2020-08-08: qty 50

## 2020-08-08 MED ORDER — SODIUM CHLORIDE 0.9 % IV SOLN
150.0000 mg | Freq: Once | INTRAVENOUS | Status: AC
  Administered 2020-08-08: 150 mg via INTRAVENOUS
  Filled 2020-08-08: qty 5

## 2020-08-08 MED FILL — Ondansetron HCl Tab 8 MG: ORAL | 10 days supply | Qty: 30 | Fill #0 | Status: AC

## 2020-08-08 MED FILL — Insulin Lispro Soln Pen-injector 100 Unit/ML (1 Unit Dial): SUBCUTANEOUS | 41 days supply | Qty: 15 | Fill #1 | Status: AC

## 2020-08-08 NOTE — Patient Instructions (Signed)
[place]   Discharge Instructions: Thank you for choosing [facility] to provide your oncology and hematology care.   If you have a lab appointment with the [facility], please go directly to the [facility] and check in at the registration area.   Wear comfortable clothing and clothing appropriate for easy access to any Portacath or PICC line.   We strive to give you quality time with your provider. You may need to reschedule your appointment if you arrive late (15 or more minutes).  Arriving late affects you and other patients whose appointments are after yours.  Also, if you miss three or more appointments without notifying the office, you may be dismissed from the clinic at the provider's discretion.      For prescription refill requests, have your pharmacy contact our office and allow 72 hours for refills to be completed.    Today you received the following chemotherapy and/or immunotherapy agents Durvalumab (IMFINZI), Gemcitabine (GEMZAR) & Cisplatin (PLATINOL).      To help prevent nausea and vomiting after your treatment, we encourage you to take your nausea medication as directed.  BELOW ARE SYMPTOMS THAT SHOULD BE REPORTED IMMEDIATELY: *FEVER GREATER THAN 100.4 F (38 C) OR HIGHER *CHILLS OR SWEATING *NAUSEA AND VOMITING THAT IS NOT CONTROLLED WITH YOUR NAUSEA MEDICATION *UNUSUAL SHORTNESS OF BREATH *UNUSUAL BRUISING OR BLEEDING *URINARY PROBLEMS (pain or burning when urinating, or frequent urination) *BOWEL PROBLEMS (unusual diarrhea, constipation, pain near the anus) TENDERNESS IN MOUTH AND THROAT WITH OR WITHOUT PRESENCE OF ULCERS (sore throat, sores in mouth, or a toothache) UNUSUAL RASH, SWELLING OR PAIN  UNUSUAL VAGINAL DISCHARGE OR ITCHING   Items with * indicate a potential emergency and should be followed up as soon as possible or go to the Emergency Department if any problems should occur.  Please show the CHEMOTHERAPY  ALERT CARD or IMMUNOTHERAPY ALERT CARD at check-in to the Emergency Department and triage nurse.  Should you have questions after your visit or need to cancel or reschedule your appointment, please contact [facility]  Dept: [phone]  and follow the prompts.  Office hours are 8:00 a.m. to 4:30 p.m. Monday - Friday. Please note that voicemails left after 4:00 p.m. may not be returned until the following business day.  We are closed weekends and major holidays. You have access to a nurse at all times for urgent questions. Please call the main number to the clinic Dept: [phone] and follow the prompts.   For any non-urgent questions, you may also contact your provider using MyChart. We now offer e-Visits for anyone 40 and older to request care online for non-urgent symptoms. For details visit [email or web address].   Also download the MyChart app! Go to the app store, search "MyChart", open the app, select [facility], and log in with your MyChart username and password.  Due to Covid, a mask is required upon entering the hospital/clinic. If you do not have a mask, one will be given to you upon arrival. For doctor visits, patients may have 1 support person aged 76 or older with them. For treatment visits, patients cannot have anyone with them due to current Covid guidelines and our immunocompromised population.   Durvalumab injection What is this medication? DURVALUMAB (dur VAL ue mab) is a monoclonal antibody. It is used to treat lungcancer. This medicine may be used for other purposes; ask your health care provider orpharmacist if you have questions. COMMON BRAND NAME(S):  IMFINZI What should I tell my care team before I take this medication? They need to know if you have any of these conditions: autoimmune diseases like Crohn's disease, ulcerative colitis, or lupus have had or planning to have an allogeneic stem cell transplant (uses someone else's stem  cells) history of organ transplant history of radiation to the chest nervous system problems like myasthenia gravis or Guillain-Barre syndrome an unusual or allergic reaction to durvalumab, other medicines, foods, dyes, or preservatives pregnant or trying to get pregnant breast-feeding How should I use this medication? This medicine is for infusion into a vein. It is given by a health careprofessional in a hospital or clinic setting. A special MedGuide will be given to you before each treatment. Be sure to readthis information carefully each time. Talk to your pediatrician regarding the use of this medicine in children.Special care may be needed. Overdosage: If you think you have taken too much of this medicine contact apoison control center or emergency room at once. NOTE: This medicine is only for you. Do not share this medicine with others. What if I miss a dose? It is important not to miss your dose. Call your doctor or health careprofessional if you are unable to keep an appointment. What may interact with this medication? Interactions have not been studied. This list may not describe all possible interactions. Give your health care provider a list of all the medicines, herbs, non-prescription drugs, or dietary supplements you use. Also tell them if you smoke, drink alcohol, or use illegaldrugs. Some items may interact with your medicine. What should I watch for while using this medication? This drug may make you feel generally unwell. Continue your course of treatmenteven though you feel ill unless your doctor tells you to stop. You may need blood work done while you are taking this medicine. Do not become pregnant while taking this medicine or for 3 months after stopping it. Women should inform their doctor if they wish to become pregnant or think they might be pregnant. There is a potential for serious side effects to an unborn child. Talk to your health care professional or pharmacist  for more information. Do not breast-feed an infant while taking this medicine orfor 3 months after stopping it. What side effects may I notice from receiving this medication? Side effects that you should report to your doctor or health care professionalas soon as possible: allergic reactions like skin rash, itching or hives, swelling of the face, lips, or tongue black, tarry stools bloody or watery diarrhea breathing problems change in emotions or moods change in sex drive changes in vision chest pain or chest tightness chills confusion cough facial flushing fever headache signs and symptoms of high blood sugar such as dizziness; dry mouth; dry skin; fruity breath; nausea; stomach pain; increased hunger or thirst; increased urination signs and symptoms of liver injury like dark yellow or brown urine; general ill feeling or flu-like symptoms; light-colored stools; loss of appetite; nausea; right upper belly pain; unusually weak or tired; yellowing of the eyes or skin stomach pain trouble passing urine or change in the amount of urine weight gain or weight loss Side effects that usually do not require medical attention (report these toyour doctor or health care professional if they continue or are bothersome): bone pain constipation loss of appetite muscle pain nausea swelling of the ankles, feet, hands tiredness This list may not describe all possible side effects. Call your doctor for medical advice about side effects. You may report  side effects to FDA at1-800-FDA-1088. Where should I keep my medication? This drug is given in a hospital or clinic and will not be stored at home. NOTE: This sheet is a summary. It may not cover all possible information. If you have questions about this medicine, talk to your doctor, pharmacist, orhealth care provider.  2022 Elsevier/Gold Standard (2019-04-01 13:01:29)  Gemcitabine injection What is this medication? GEMCITABINE (jem SYE ta been)  is a chemotherapy drug. This medicine is used to treat many types of cancer like breast cancer, lung cancer, pancreatic cancer,and ovarian cancer. This medicine may be used for other purposes; ask your health care provider orpharmacist if you have questions. COMMON BRAND NAME(S): Gemzar, Infugem What should I tell my care team before I take this medication? They need to know if you have any of these conditions: blood disorders infection kidney disease liver disease lung or breathing disease, like asthma recent or ongoing radiation therapy an unusual or allergic reaction to gemcitabine, other chemotherapy, other medicines, foods, dyes, or preservatives pregnant or trying to get pregnant breast-feeding How should I use this medication? This drug is given as an infusion into a vein. It is administered in a hospitalor clinic by a specially trained health care professional. Talk to your pediatrician regarding the use of this medicine in children.Special care may be needed. Overdosage: If you think you have taken too much of this medicine contact apoison control center or emergency room at once. NOTE: This medicine is only for you. Do not share this medicine with others. What if I miss a dose? It is important not to miss your dose. Call your doctor or health careprofessional if you are unable to keep an appointment. What may interact with this medication? medicines to increase blood counts like filgrastim, pegfilgrastim, sargramostim some other chemotherapy drugs like cisplatin vaccines Talk to your doctor or health care professional before taking any of thesemedicines: acetaminophen aspirin ibuprofen ketoprofen naproxen This list may not describe all possible interactions. Give your health care provider a list of all the medicines, herbs, non-prescription drugs, or dietary supplements you use. Also tell them if you smoke, drink alcohol, or use illegaldrugs. Some items may interact with your  medicine. What should I watch for while using this medication? Visit your doctor for checks on your progress. This drug may make you feel generally unwell. This is not uncommon, as chemotherapy can affect healthy cells as well as cancer cells. Report any side effects. Continue your course oftreatment even though you feel ill unless your doctor tells you to stop. In some cases, you may be given additional medicines to help with side effects.Follow all directions for their use. Call your doctor or health care professional for advice if you get a fever, chills or sore throat, or other symptoms of a cold or flu. Do not treat yourself. This drug decreases your body's ability to fight infections. Try toavoid being around people who are sick. This medicine may increase your risk to bruise or bleed. Call your doctor orhealth care professional if you notice any unusual bleeding. Be careful brushing and flossing your teeth or using a toothpick because you may get an infection or bleed more easily. If you have any dental work done,tell your dentist you are receiving this medicine. Avoid taking products that contain aspirin, acetaminophen, ibuprofen, naproxen, or ketoprofen unless instructed by your doctor. These medicines may hide afever. Do not become pregnant while taking this medicine or for 6 months after stopping it. Women should inform  their doctor if they wish to become pregnant or think they might be pregnant. Men should not father a child while taking this medicine and for 3 months after stopping it. There is a potential for serious side effects to an unborn child. Talk to your health care professional or pharmacist for more information. Do not breast-feed an infant while takingthis medicine or for at least 1 week after stopping it. Men should inform their doctors if they wish to father a child. This medicine may lower sperm counts. Talk with your doctor or health care professional ifyou are concerned about  your fertility. What side effects may I notice from receiving this medication? Side effects that you should report to your doctor or health care professionalas soon as possible: allergic reactions like skin rash, itching or hives, swelling of the face, lips, or tongue breathing problems pain, redness, or irritation at site where injected signs and symptoms of a dangerous change in heartbeat or heart rhythm like chest pain; dizziness; fast or irregular heartbeat; palpitations; feeling faint or lightheaded, falls; breathing problems signs of decreased platelets or bleeding - bruising, pinpoint red spots on the skin, black, tarry stools, blood in the urine signs of decreased red blood cells - unusually weak or tired, feeling faint or lightheaded, falls signs of infection - fever or chills, cough, sore throat, pain or difficulty passing urine signs and symptoms of kidney injury like trouble passing urine or change in the amount of urine signs and symptoms of liver injury like dark yellow or brown urine; general ill feeling or flu-like symptoms; light-colored stools; loss of appetite; nausea; right upper belly pain; unusually weak or tired; yellowing of the eyes or skin swelling of ankles, feet, hands Side effects that usually do not require medical attention (report to yourdoctor or health care professional if they continue or are bothersome): constipation diarrhea hair loss loss of appetite nausea rash vomiting This list may not describe all possible side effects. Call your doctor for medical advice about side effects. You may report side effects to FDA at1-800-FDA-1088. Where should I keep my medication? This drug is given in a hospital or clinic and will not be stored at home. NOTE: This sheet is a summary. It may not cover all possible information. If you have questions about this medicine, talk to your doctor, pharmacist, orhealth care provider.  2022 Elsevier/Gold Standard (2017-04-16  18:06:11)  Cisplatin injection What is this medication? CISPLATIN (SIS pla tin) is a chemotherapy drug. It targets fast dividing cells, like cancer cells, and causes these cells to die. This medicine is used totreat many types of cancer like bladder, ovarian, and testicular cancers. This medicine may be used for other purposes; ask your health care provider orpharmacist if you have questions. COMMON BRAND NAME(S): Platinol, Platinol -AQ What should I tell my care team before I take this medication? They need to know if you have any of these conditions: eye disease, vision problems hearing problems kidney disease low blood counts, like white cells, platelets, or red blood cells tingling of the fingers or toes, or other nerve disorder an unusual or allergic reaction to cisplatin, carboplatin, oxaliplatin, other medicines, foods, dyes, or preservatives pregnant or trying to get pregnant breast-feeding How should I use this medication? This drug is given as an infusion into a vein. It is administered in a hospitalor clinic by a specially trained health care professional. Talk to your pediatrician regarding the use of this medicine in children.Special care may be needed. Overdosage: If  you think you have taken too much of this medicine contact apoison control center or emergency room at once. NOTE: This medicine is only for you. Do not share this medicine with others. What if I miss a dose? It is important not to miss a dose. Call your doctor or health careprofessional if you are unable to keep an appointment. What may interact with this medication? This medicine may interact with the following medications: foscarnet certain antibiotics like amikacin, gentamicin, neomycin, polymyxin B, streptomycin, tobramycin, vancomycin This list may not describe all possible interactions. Give your health care provider a list of all the medicines, herbs, non-prescription drugs, or dietary supplements you  use. Also tell them if you smoke, drink alcohol, or use illegaldrugs. Some items may interact with your medicine. What should I watch for while using this medication? Your condition will be monitored carefully while you are receiving this medicine. You will need important blood work done while you are taking thismedicine. This drug may make you feel generally unwell. This is not uncommon, as chemotherapy can affect healthy cells as well as cancer cells. Report any side effects. Continue your course of treatment even though you feel ill unless yourdoctor tells you to stop. This medicine may increase your risk of getting an infection. Call your healthcare professional for advice if you get a fever, chills, or sore throat, or other symptoms of a cold or flu. Do not treat yourself. Try to avoid beingaround people who are sick. Avoid taking medicines that contain aspirin, acetaminophen, ibuprofen, naproxen, or ketoprofen unless instructed by your healthcare professional.These medicines may hide a fever. This medicine may increase your risk to bruise or bleed. Call your doctor orhealth care professional if you notice any unusual bleeding. Be careful brushing and flossing your teeth or using a toothpick because you may get an infection or bleed more easily. If you have any dental work done,tell your dentist you are receiving this medicine. Do not become pregnant while taking this medicine or for 14 months after stopping it. Women should inform their healthcare professional if they wish to become pregnant or think they might be pregnant. Men should not father a child while taking this medicine and for 11 months after stopping it. There is potential for serious side effects to an unborn child. Talk to your healthcareprofessional for more information. Do not breast-feed an infant while taking this medicine. This medicine has caused ovarian failure in some women. This medicine may make it more difficult to get  pregnant. Talk to your healthcare professional if [place] concerned about your fertility. This medicine has caused decreased sperm counts in some men. This may make it more difficult to father a child. Talk to your healthcare professional if [place] concerned about your fertility. Drink fluids as directed while you are taking this medicine. This will helpprotect your kidneys. Call your doctor or health care professional if you get diarrhea. Do not treatyourself. What side effects may I notice from receiving this medication? Side effects that you should report to your doctor or health care professionalas soon as possible: allergic reactions like skin rash, itching or hives, swelling of the face, lips, or tongue blurred vision changes in vision decreased hearing or ringing of the ears nausea, vomiting pain, redness, or irritation at site where injected pain, tingling, numbness in the hands or feet signs and symptoms of bleeding such as bloody or black, tarry stools; red or dark brown urine; spitting up blood or brown material that looks like coffee grounds;  red spots on the skin; unusual bruising or bleeding from the eyes, gums, or nose signs and symptoms of infection like fever; chills; cough; sore throat; pain or trouble passing urine signs and symptoms of kidney injury like trouble passing urine or change in the amount of urine signs and symptoms of low red blood cells or anemia such as unusually weak or tired; feeling faint or lightheaded; falls; breathing problems Side effects that usually do not require medical attention (report to yourdoctor or health care professional if they continue or are bothersome): loss of appetite mouth sores muscle cramps This list may not describe all possible side effects. Call your doctor for medical advice about side effects. You may report side effects to FDA at1-800-FDA-1088. Where should I keep my medication? This drug is given in a hospital or clinic and  will not be stored at home. NOTE: This sheet is a summary. It may not cover all possible information. If you have questions about this medicine, talk to your doctor, pharmacist, orhealth care provider.  2022 Elsevier/Gold Standard (2018-01-16 15:59:17)

## 2020-08-08 NOTE — Progress Notes (Signed)
Cycle 9 day 8 released 6/27 so Cycle 10 day 1 will be 7/5. No day 8 this cycle per MD

## 2020-08-09 ENCOUNTER — Other Ambulatory Visit (HOSPITAL_BASED_OUTPATIENT_CLINIC_OR_DEPARTMENT_OTHER): Payer: Self-pay

## 2020-08-09 ENCOUNTER — Inpatient Hospital Stay: Payer: No Typology Code available for payment source

## 2020-08-09 VITALS — BP 146/82 | HR 74 | Temp 98.1°F | Resp 18

## 2020-08-09 DIAGNOSIS — Z5112 Encounter for antineoplastic immunotherapy: Secondary | ICD-10-CM | POA: Diagnosis not present

## 2020-08-09 DIAGNOSIS — C221 Intrahepatic bile duct carcinoma: Secondary | ICD-10-CM

## 2020-08-09 MED ORDER — PEGFILGRASTIM-JMDB 6 MG/0.6ML ~~LOC~~ SOSY
6.0000 mg | PREFILLED_SYRINGE | Freq: Once | SUBCUTANEOUS | Status: AC
  Administered 2020-08-09: 6 mg via SUBCUTANEOUS

## 2020-08-09 NOTE — Patient Instructions (Signed)

## 2020-08-14 ENCOUNTER — Other Ambulatory Visit (HOSPITAL_COMMUNITY): Payer: Self-pay

## 2020-08-15 ENCOUNTER — Other Ambulatory Visit (HOSPITAL_BASED_OUTPATIENT_CLINIC_OR_DEPARTMENT_OTHER): Payer: Self-pay

## 2020-08-17 ENCOUNTER — Other Ambulatory Visit (HOSPITAL_BASED_OUTPATIENT_CLINIC_OR_DEPARTMENT_OTHER): Payer: Self-pay

## 2020-08-18 ENCOUNTER — Other Ambulatory Visit: Payer: Self-pay

## 2020-08-18 ENCOUNTER — Inpatient Hospital Stay: Payer: No Typology Code available for payment source

## 2020-08-18 ENCOUNTER — Ambulatory Visit (HOSPITAL_BASED_OUTPATIENT_CLINIC_OR_DEPARTMENT_OTHER)
Admission: RE | Admit: 2020-08-18 | Discharge: 2020-08-18 | Disposition: A | Discharge status: Home / Self Care | Payer: No Typology Code available for payment source | Source: Ambulatory Visit | Attending: Oncology | Admitting: Oncology

## 2020-08-18 ENCOUNTER — Other Ambulatory Visit (HOSPITAL_BASED_OUTPATIENT_CLINIC_OR_DEPARTMENT_OTHER): Payer: Self-pay

## 2020-08-18 DIAGNOSIS — C221 Intrahepatic bile duct carcinoma: Secondary | ICD-10-CM | POA: Diagnosis not present

## 2020-08-18 MED ORDER — IOHEXOL 300 MG/ML  SOLN
100.0000 mL | Freq: Once | INTRAMUSCULAR | Status: AC | PRN
  Administered 2020-08-18: 80 mL via INTRAVENOUS

## 2020-08-18 MED FILL — Insulin Pen Needle 31 G X 5 MM (1/5" or 3/16"): 25 days supply | Qty: 100 | Fill #2 | Status: AC

## 2020-08-20 ENCOUNTER — Other Ambulatory Visit: Payer: Self-pay | Admitting: Oncology

## 2020-08-21 ENCOUNTER — Inpatient Hospital Stay: Payer: No Typology Code available for payment source

## 2020-08-21 ENCOUNTER — Other Ambulatory Visit (HOSPITAL_BASED_OUTPATIENT_CLINIC_OR_DEPARTMENT_OTHER): Payer: Self-pay

## 2020-08-21 ENCOUNTER — Other Ambulatory Visit: Payer: Self-pay

## 2020-08-21 ENCOUNTER — Encounter: Payer: Self-pay | Admitting: *Deleted

## 2020-08-21 ENCOUNTER — Inpatient Hospital Stay (HOSPITAL_BASED_OUTPATIENT_CLINIC_OR_DEPARTMENT_OTHER): Payer: No Typology Code available for payment source | Admitting: Oncology

## 2020-08-21 ENCOUNTER — Encounter: Payer: Self-pay | Admitting: Oncology

## 2020-08-21 VITALS — BP 132/80

## 2020-08-21 VITALS — BP 154/101 | HR 69 | Temp 97.8°F | Resp 18 | Wt 240.6 lb

## 2020-08-21 DIAGNOSIS — C221 Intrahepatic bile duct carcinoma: Secondary | ICD-10-CM

## 2020-08-21 DIAGNOSIS — Z5112 Encounter for antineoplastic immunotherapy: Secondary | ICD-10-CM | POA: Diagnosis not present

## 2020-08-21 LAB — CBC WITH DIFFERENTIAL (CANCER CENTER ONLY)
Abs Immature Granulocytes: 0.02 10*3/uL (ref 0.00–0.07)
Basophils Absolute: 0.1 10*3/uL (ref 0.0–0.1)
Basophils Relative: 1 %
Eosinophils Absolute: 0.4 10*3/uL (ref 0.0–0.5)
Eosinophils Relative: 6 %
HCT: 34.3 % — ABNORMAL LOW (ref 39.0–52.0)
Hemoglobin: 11.6 g/dL — ABNORMAL LOW (ref 13.0–17.0)
Immature Granulocytes: 0 %
Lymphocytes Relative: 17 %
Lymphs Abs: 1.1 10*3/uL (ref 0.7–4.0)
MCH: 34.2 pg — ABNORMAL HIGH (ref 26.0–34.0)
MCHC: 33.8 g/dL (ref 30.0–36.0)
MCV: 101.2 fL — ABNORMAL HIGH (ref 80.0–100.0)
Monocytes Absolute: 0.7 10*3/uL (ref 0.1–1.0)
Monocytes Relative: 11 %
Neutro Abs: 4.4 10*3/uL (ref 1.7–7.7)
Neutrophils Relative %: 65 %
Platelet Count: 175 10*3/uL (ref 150–400)
RBC: 3.39 MIL/uL — ABNORMAL LOW (ref 4.22–5.81)
RDW: 14.5 % (ref 11.5–15.5)
WBC Count: 6.7 10*3/uL (ref 4.0–10.5)
nRBC: 0 % (ref 0.0–0.2)

## 2020-08-21 LAB — CMP (CANCER CENTER ONLY)
ALT: 18 U/L (ref 0–44)
AST: 32 U/L (ref 15–41)
Albumin: 4 g/dL (ref 3.5–5.0)
Alkaline Phosphatase: 141 U/L — ABNORMAL HIGH (ref 38–126)
Anion gap: 7 (ref 5–15)
BUN: 12 mg/dL (ref 6–20)
CO2: 28 mmol/L (ref 22–32)
Calcium: 9.5 mg/dL (ref 8.9–10.3)
Chloride: 101 mmol/L (ref 98–111)
Creatinine: 0.83 mg/dL (ref 0.61–1.24)
GFR, Estimated: >60 mL/min (ref >60)
Glucose, Bld: 160 mg/dL — ABNORMAL HIGH (ref 70–99)
Potassium: 4.3 mmol/L (ref 3.5–5.1)
Sodium: 136 mmol/L (ref 135–145)
Total Bilirubin: 0.4 mg/dL (ref 0.3–1.2)
Total Protein: 7.1 g/dL (ref 6.5–8.1)

## 2020-08-21 LAB — MAGNESIUM: Magnesium: 2 mg/dL (ref 1.7–2.4)

## 2020-08-21 MED ORDER — POTASSIUM CHLORIDE IN NACL 20-0.9 MEQ/L-% IV SOLN
Freq: Once | INTRAVENOUS | Status: AC
  Filled 2020-08-21: qty 1000

## 2020-08-21 MED ORDER — SODIUM CHLORIDE 0.9 % IV SOLN
25.0000 mg/m2 | Freq: Once | INTRAVENOUS | Status: AC
  Administered 2020-08-21: 56 mg via INTRAVENOUS
  Filled 2020-08-21: qty 56

## 2020-08-21 MED ORDER — SODIUM CHLORIDE 0.9% FLUSH
10.0000 mL | INTRAVENOUS | Status: DC | PRN
  Administered 2020-08-21: 10 mL
  Filled 2020-08-21: qty 10

## 2020-08-21 MED ORDER — SODIUM CHLORIDE 0.9 % IV SOLN
Freq: Once | INTRAVENOUS | Status: AC
  Filled 2020-08-21: qty 250

## 2020-08-21 MED ORDER — SODIUM CHLORIDE 0.9 % IV SOLN
1500.0000 mg | Freq: Once | INTRAVENOUS | Status: AC
  Administered 2020-08-21: 1500 mg via INTRAVENOUS
  Filled 2020-08-21: qty 30

## 2020-08-21 MED ORDER — FAMOTIDINE 20 MG IN NS 100 ML IVPB
20.0000 mg | Freq: Once | INTRAVENOUS | Status: AC
  Administered 2020-08-21: 20 mg via INTRAVENOUS
  Filled 2020-08-21: qty 100

## 2020-08-21 MED ORDER — HEPARIN SOD (PORK) LOCK FLUSH 100 UNIT/ML IV SOLN
500.0000 [IU] | Freq: Once | INTRAVENOUS | Status: AC | PRN
  Administered 2020-08-21: 500 [IU]
  Filled 2020-08-21: qty 5

## 2020-08-21 MED ORDER — SODIUM CHLORIDE 0.9 % IV SOLN
150.0000 mg | Freq: Once | INTRAVENOUS | Status: AC
  Administered 2020-08-21: 150 mg via INTRAVENOUS
  Filled 2020-08-21: qty 150

## 2020-08-21 MED ORDER — DEXAMETHASONE SODIUM PHOSPHATE 10 MG/ML IJ SOLN
5.0000 mg | Freq: Once | INTRAMUSCULAR | Status: AC
  Administered 2020-08-21: 5 mg via INTRAVENOUS
  Filled 2020-08-21: qty 1

## 2020-08-21 MED ORDER — SODIUM CHLORIDE 0.9 % IV SOLN
800.0000 mg/m2 | Freq: Once | INTRAVENOUS | Status: AC
  Administered 2020-08-21: 1786 mg via INTRAVENOUS
  Filled 2020-08-21: qty 46.97

## 2020-08-21 MED ORDER — PALONOSETRON HCL INJECTION 0.25 MG/5ML
0.2500 mg | Freq: Once | INTRAVENOUS | Status: AC
  Administered 2020-08-21: 0.25 mg via INTRAVENOUS
  Filled 2020-08-21: qty 5

## 2020-08-21 MED ORDER — MAGNESIUM SULFATE 2 GM/50ML IV SOLN
2.0000 g | Freq: Once | INTRAVENOUS | Status: AC
  Administered 2020-08-21: 2 g via INTRAVENOUS
  Filled 2020-08-21: qty 50

## 2020-08-21 NOTE — Progress Notes (Signed)
[place] OFFICE PROGRESS NOTE   Diagnosis: Cholangiocarcinoma  INTERVAL HISTORY:   [name] returns as scheduled.  He completed another cycle of chemotherapy on 08/08/2020.  He continues to have intermittent neuropathy symptoms in the extremities.  This has not progressed.  Good appetite.  He reports heaviness at the shoulders with exertion.  The symptoms last for a few minutes.  He has undergone a cardiology evaluation in the past.  No rash or fever.  No diarrhea.  Objective:  Vital signs in last 24 hours:  Blood pressure (!) 154/101, pulse 69, temperature 97.8 F (36.6 C), temperature source Oral, resp. rate 18, SpO2 99 %.    HEENT: No thrush or ulcers Resp: Lungs clear bilaterally Cardio: Regular rate and rhythm GI: No hepatosplenomegaly, nontender Vascular: No leg edema     Portacath/PICC-without erythema  Lab Results:  Lab Results  Component Value Date   WBC 6.7 08/21/2020   HGB 11.6 (L) 08/21/2020   HCT 34.3 (L) 08/21/2020   MCV 101.2 (H) 08/21/2020   PLT 175 08/21/2020   NEUTROABS 4.4 08/21/2020    CMP  Lab Results  Component Value Date   NA 136 08/21/2020   K 4.3 08/21/2020   CL 101 08/21/2020   CO2 28 08/21/2020   GLUCOSE 160 (H) 08/21/2020   BUN 12 08/21/2020   CREATININE 0.83 08/21/2020   CALCIUM 9.5 08/21/2020   PROT 7.1 08/21/2020   ALBUMIN 4.0 08/21/2020   AST 32 08/21/2020   ALT 18 08/21/2020   ALKPHOS 141 (H) 08/21/2020   BILITOT 0.4 08/21/2020   GFRNONAA >60 08/21/2020   GFRAA 127 01/11/2020    Lab Results  Component Value Date   CEA1 2.15 01/14/2020   CAN199 89 (H) 06/12/2020      Imaging:  CT ABDOMEN PELVIS W CONTRAST  Result Date: 08/20/2020 CLINICAL DATA:  Cholangiocarcinoma.  Restaging. EXAM: CT ABDOMEN AND PELVIS WITH CONTRAST TECHNIQUE: Multidetector CT imaging of the abdomen and pelvis was performed using the standard protocol following bolus administration of intravenous contrast. CONTRAST:  88m  OMNIPAQUE IOHEXOL 300 MG/ML  SOLN COMPARISON:  06/06/2020 CT chest, abdomen and pelvis. FINDINGS: Lower chest: No significant pulmonary nodules or acute consolidative airspace disease. Coronary atherosclerosis. Hepatobiliary: Poorly marginated hypoenhancing 7.8 x 5.5 cm central liver mass (series 3/image 23), previously 8.4 x 5.8 cm using similar measurement technique on 06/06/2020 CT, mildly decreased. Several (at least 8) smaller hypodense liver masses scattered throughout the liver, which are stable to mildly increased. For example a 2.9 x 2.3 cm anterior left liver mass (series 3/image 25), mildly increased from 2.5 x 2.1 cm. A segment 7 right liver dome 1.7 x 1.5 cm mass (series 3/image 16), previously 1.7 x 1.6 cm, stable. A 1.9 x 1.7 cm central liver mass (series 3/image 36), mildly increased from 1.7 x 1.5 cm. A posterior right liver 2.0 x 2.0 cm mass (series 3/image 31), minimally increased from 1.9 x 1.8 cm. Relative atrophy of left liver appears slightly progressive. Prominent intrahepatic biliary ductal dilatation in the left liver lobe, not appreciably changed. Relative sparing of intrahepatic biliary ductal dilatation in the right liver. Normal gallbladder with no radiopaque cholelithiasis. No biliary ductal dilatation. Pancreas: Normal, with no mass or duct dilation. Spleen: Mild splenomegaly, stable. Craniocaudal splenic length 13.6 cm. No splenic masses. Adrenals/Urinary Tract: Normal adrenals. Normal kidneys with no hydronephrosis and no renal mass. Chronic mild diffuse bladder wall thickening is unchanged. Stomach/Bowel: Normal non-distended stomach. Normal caliber small bowel with no  small bowel wall thickening. Normal appendix. Oral contrast transits to the colon. Normal large bowel with no diverticulosis, large bowel wall thickening or pericolonic fat stranding. Vascular/Lymphatic: Normal caliber abdominal aorta. Patent main portal, splenic, renal and hepatic veins. Stable attenuation of the  proximal left portal vein. Enlarged 2.4 cm short axis diameter portacaval node (series 3/image 36), previously 2.4 cm, stable. Enlarged heterogeneous 2.7 cm porta hepatis node (series 3/image 28), previously 2.7 cm, stable. No newly enlarged abdominopelvic nodes. Reproductive: Normal size prostate. Other: No pneumoperitoneum, ascites or focal fluid collection. Musculoskeletal: No aggressive appearing focal osseous lesions. Mild thoracolumbar spondylosis. IMPRESSION: 1. Mild mixed interval changes since 06/06/2020 CT. 2. Dominant hypoenhancing central liver mass is mildly decreased. 3. Scattered liver metastases throughout the liver are stable to mildly increased. 4. Stable upper retroperitoneal metastatic adenopathy. 5. Stable mild splenomegaly. 6. Chronic mild diffuse bladder wall thickening, nonspecific. Electronically Signed   By: Ilona Sorrel M.D.   On: 08/20/2020 12:24    Medications: I have reviewed the patient's current medications.   Assessment/Plan: Cholangiocarcinoma  multiple liver masses and abdominal lymphadenopathy CTs 01/13/2020-rounded hypodense mass appears to arise from the pancreas neck, multiple rim-enhancing masses in the liver, primarily left liver with segmental dilation of the left lobe Intermatic bile ducts, ill-defined hypodensity the central liver with effacement of the left portal vein, enlarged portacaval and retroperitoneal lymph nodes Ultrasound-guided biopsy of the left liver lesion 01/19/2020-adenocarcinoma, cytokeratin 7+, MSS, tumor mutation burden 1, IDH1 R132C MRI abdomen 01/31/2020-poorly marginated central liver mass, multiple smaller similar satellite liver masses, extrinsic mass-effect at the biliary hilum with intrahepatic biliary ductal dilatation throughout the left liver with mild Intermatic dilatation the superior right liver, no pancreas mass or ductal dilatation, normal spleen size, numerous enlarged enhancing lymph nodes at the porta hepatis, peripancreatic,  portacaval, aortocaval, left periaortic chains Cycle 1 gemcitabine/cisplatin 02/04/2020 Cycle 2 gemcitabine/cisplatin 02/28/2020 Cycle 3 gemcitabine/cisplatin 03/20/2020 CT abdomen/pelvis 03/31/2020-mild decrease in central liver tumor, mild decrease in size of liver metastases and upper abdominal adenopathy, new splenomegaly Cycle 4 gemcitabine/cisplatin 04/10/2020 Cycle 5 gemcitabine/cisplatin 05/01/2020 Cycle 6 gemcitabine/cisplatin plus durvalumab 05/22/2020 CTs 06/06/2020- dominant central liver mass mildly enlarged, other liver lesions and abdominal adenopathy is stable, no evidence of metastatic disease to the chest Cycle 7 gemcitabine/cisplatin plus Durvalumab 06/12/2020 Cycle 8 gemcitabine/cisplatin plus Durvalumab 06/30/2020 Cycle 9 gemcitabine/cisplatin plus Durvalumab 07/31/2020 CT abdomen/pelvis 03/31/2020-decreased size of dominant central liver mass, slight increase in size and stable additional liver lesions, stable portacaval node Cycle 10 gemcitabine/cisplatin plus Durvalumab 08/21/2020   Cough-likely related to diaphragmatic irritation from #1 Anorexia/weight loss Hypercalcemia-likely hypercalcemia malignancy, status post intravenous hydration and Zometa 01/14/2020 Diabetes Hyperlipidemia Hypertension History of peripheral neuropathy secondary to diabetes Severe back pain following Fulphila-oxycodone prescribed Thrombocytopenia secondary to chemotherapy-gemcitabine held with day 1 cycle 8 and then dose reduced COVID-19 infection 07/22/2020   Disposition: [name] appears stable.  The restaging CT reveals overall stable disease.  I recommend continuing gemcitabine/cisplatin plus Durvalumab.  He agrees.  I reviewed the CT images.  He will begin another cycle of treatment today.  We will refer him back to cardiology if he has persistent chest/shoulder tightness with exertion.  I think the symptoms are most likely unrelated to chemotherapy.  He will return for an office visit prior  to the next cycle of gemcitabine/cisplatin/Durvalumab on 09/11/2020.  Betsy Coder, MD  08/21/2020  10:30 AM

## 2020-08-21 NOTE — Progress Notes (Signed)
Clarified with MD that today is D1 of C11.  Imfinzi was given 2 weeks ago but will be q21d from this cycle forward to keep it on D1 of each 21 day cycle

## 2020-08-21 NOTE — Progress Notes (Signed)
Faxed 08/21/20 office note w/tx plan and CT scan report to [facility] as requested [phone]. Case [phone]

## 2020-08-21 NOTE — Patient Instructions (Signed)
[facility]  Discharge Instructions: Thank you for choosing [facility] to provide your oncology and hematology care.   If you have a lab appointment with the [facility], please go directly to the [facility] and check in at the registration area.   Wear comfortable clothing and clothing appropriate for easy access to any Portacath or PICC line.   We strive to give you quality time with your provider. You may need to reschedule your appointment if you arrive late (15 or more minutes).  Arriving late affects you and other patients whose appointments are after yours.  Also, if you miss three or more appointments without notifying the office, you may be dismissed from the clinic at the provider's discretion.      For prescription refill requests, have your pharmacy contact our office and allow 72 hours for refills to be completed.    Today you received the following chemotherapy and/or immunotherapy agents durvalumab, gemcitabine, cisplatin    To help prevent nausea and vomiting after your treatment, we encourage you to take your nausea medication as directed.  BELOW ARE SYMPTOMS THAT SHOULD BE REPORTED IMMEDIATELY: *FEVER GREATER THAN 100.4 F (38 C) OR HIGHER *CHILLS OR SWEATING *NAUSEA AND VOMITING THAT IS NOT CONTROLLED WITH YOUR NAUSEA MEDICATION *UNUSUAL SHORTNESS OF BREATH *UNUSUAL BRUISING OR BLEEDING *URINARY PROBLEMS (pain or burning when urinating, or frequent urination) *BOWEL PROBLEMS (unusual diarrhea, constipation, pain near the anus) TENDERNESS IN MOUTH AND THROAT WITH OR WITHOUT PRESENCE OF ULCERS (sore throat, sores in mouth, or a toothache) UNUSUAL RASH, SWELLING OR PAIN  UNUSUAL VAGINAL DISCHARGE OR ITCHING   Items with * indicate a potential emergency and should be followed up as soon as possible or go to the Emergency Department if any problems should occur.  Please show the CHEMOTHERAPY ALERT CARD or IMMUNOTHERAPY ALERT  CARD at check-in to the Emergency Department and triage nurse.  Should you have questions after your visit or need to cancel or reschedule your appointment, please contact [facility]  Dept: [phone]  and follow the prompts.  Office hours are 8:00 a.m. to 4:30 p.m. Monday - Friday. Please note that voicemails left after 4:00 p.m. may not be returned until the following business day.  We are closed weekends and major holidays. You have access to a nurse at all times for urgent questions. Please call the main number to the clinic Dept: [phone] and follow the prompts.   For any non-urgent questions, you may also contact your provider using MyChart. We now offer e-Visits for anyone 57 and older to request care online for non-urgent symptoms. For details visit [email or web address].   Also download the MyChart app! Go to the app store, search "MyChart", open the app, select [facility], and log in with your MyChart username and password.  Due to Covid, a mask is required upon entering the hospital/clinic. If you do not have a mask, one will be given to you upon arrival. For doctor visits, patients may have 1 support person aged 72 or older with them. For treatment visits, patients cannot have anyone with them due to current Covid guidelines and our immunocompromised population.   Durvalumab injection What is this medication? DURVALUMAB (dur VAL ue mab) is a monoclonal antibody. It is used to treat lungcancer. This medicine may be used for other purposes; ask your health care provider orpharmacist if you have questions. COMMON BRAND NAME(S): IMFINZI What should I tell my care  team before I take this medication? They need to know if you have any of these conditions: autoimmune diseases like Crohn's disease, ulcerative colitis, or lupus have had or planning to have an allogeneic stem cell transplant (uses someone else's stem cells) history of organ  transplant history of radiation to the chest nervous system problems like myasthenia gravis or Guillain-Barre syndrome an unusual or allergic reaction to durvalumab, other medicines, foods, dyes, or preservatives pregnant or trying to get pregnant breast-feeding How should I use this medication? This medicine is for infusion into a vein. It is given by a health careprofessional in a hospital or clinic setting. A special MedGuide will be given to you before each treatment. Be sure to readthis information carefully each time. Talk to your pediatrician regarding the use of this medicine in children.Special care may be needed. Overdosage: If you think you have taken too much of this medicine contact apoison control center or emergency room at once. NOTE: This medicine is only for you. Do not share this medicine with others. What if I miss a dose? It is important not to miss your dose. Call your doctor or health careprofessional if you are unable to keep an appointment. What may interact with this medication? Interactions have not been studied. This list may not describe all possible interactions. Give your health care provider a list of all the medicines, herbs, non-prescription drugs, or dietary supplements you use. Also tell them if you smoke, drink alcohol, or use illegaldrugs. Some items may interact with your medicine. What should I watch for while using this medication? This drug may make you feel generally unwell. Continue your course of treatmenteven though you feel ill unless your doctor tells you to stop. You may need blood work done while you are taking this medicine. Do not become pregnant while taking this medicine or for 3 months after stopping it. Women should inform their doctor if they wish to become pregnant or think they might be pregnant. There is a potential for serious side effects to an unborn child. Talk to your health care professional or pharmacist for more information. Do  not breast-feed an infant while taking this medicine orfor 3 months after stopping it. What side effects may I notice from receiving this medication? Side effects that you should report to your doctor or health care professionalas soon as possible: allergic reactions like skin rash, itching or hives, swelling of the face, lips, or tongue black, tarry stools bloody or watery diarrhea breathing problems change in emotions or moods change in sex drive changes in vision chest pain or chest tightness chills confusion cough facial flushing fever headache signs and symptoms of high blood sugar such as dizziness; dry mouth; dry skin; fruity breath; nausea; stomach pain; increased hunger or thirst; increased urination signs and symptoms of liver injury like dark yellow or brown urine; general ill feeling or flu-like symptoms; light-colored stools; loss of appetite; nausea; right upper belly pain; unusually weak or tired; yellowing of the eyes or skin stomach pain trouble passing urine or change in the amount of urine weight gain or weight loss Side effects that usually do not require medical attention (report these toyour doctor or health care professional if they continue or are bothersome): bone pain constipation loss of appetite muscle pain nausea swelling of the ankles, feet, hands tiredness This list may not describe all possible side effects. Call your doctor for medical advice about side effects. You may report side effects to FDA at1-800-FDA-1088. Where should  I keep my medication? This drug is given in a hospital or clinic and will not be stored at home. NOTE: This sheet is a summary. It may not cover all possible information. If you have questions about this medicine, talk to your doctor, pharmacist, orhealth care provider.  2022 Elsevier/Gold Standard (2019-04-01 13:01:29)  Gemcitabine injection What is this medication? GEMCITABINE (jem SYE ta been) is a chemotherapy drug.  This medicine is used to treat many types of cancer like breast cancer, lung cancer, pancreatic cancer,and ovarian cancer. This medicine may be used for other purposes; ask your health care provider orpharmacist if you have questions. COMMON BRAND NAME(S): Gemzar, Infugem What should I tell my care team before I take this medication? They need to know if you have any of these conditions: blood disorders infection kidney disease liver disease lung or breathing disease, like asthma recent or ongoing radiation therapy an unusual or allergic reaction to gemcitabine, other chemotherapy, other medicines, foods, dyes, or preservatives pregnant or trying to get pregnant breast-feeding How should I use this medication? This drug is given as an infusion into a vein. It is administered in a hospitalor clinic by a specially trained health care professional. Talk to your pediatrician regarding the use of this medicine in children.Special care may be needed. Overdosage: If you think you have taken too much of this medicine contact apoison control center or emergency room at once. NOTE: This medicine is only for you. Do not share this medicine with others. What if I miss a dose? It is important not to miss your dose. Call your doctor or health careprofessional if you are unable to keep an appointment. What may interact with this medication? medicines to increase blood counts like filgrastim, pegfilgrastim, sargramostim some other chemotherapy drugs like cisplatin vaccines Talk to your doctor or health care professional before taking any of thesemedicines: acetaminophen aspirin ibuprofen ketoprofen naproxen This list may not describe all possible interactions. Give your health care provider a list of all the medicines, herbs, non-prescription drugs, or dietary supplements you use. Also tell them if you smoke, drink alcohol, or use illegaldrugs. Some items may interact with your medicine. What should  I watch for while using this medication? Visit your doctor for checks on your progress. This drug may make you feel generally unwell. This is not uncommon, as chemotherapy can affect healthy cells as well as cancer cells. Report any side effects. Continue your course oftreatment even though you feel ill unless your doctor tells you to stop. In some cases, you may be given additional medicines to help with side effects.Follow all directions for their use. Call your doctor or health care professional for advice if you get a fever, chills or sore throat, or other symptoms of a cold or flu. Do not treat yourself. This drug decreases your body's ability to fight infections. Try toavoid being around people who are sick. This medicine may increase your risk to bruise or bleed. Call your doctor orhealth care professional if you notice any unusual bleeding. Be careful brushing and flossing your teeth or using a toothpick because you may get an infection or bleed more easily. If you have any dental work done,tell your dentist you are receiving this medicine. Avoid taking products that contain aspirin, acetaminophen, ibuprofen, naproxen, or ketoprofen unless instructed by your doctor. These medicines may hide afever. Do not become pregnant while taking this medicine or for 6 months after stopping it. Women should inform their doctor if they wish to become  pregnant or think they might be pregnant. Men should not father a child while taking this medicine and for 3 months after stopping it. There is a potential for serious side effects to an unborn child. Talk to your health care professional or pharmacist for more information. Do not breast-feed an infant while takingthis medicine or for at least 1 week after stopping it. Men should inform their doctors if they wish to father a child. This medicine may lower sperm counts. Talk with your doctor or health care professional ifyou are concerned about your fertility. What  side effects may I notice from receiving this medication? Side effects that you should report to your doctor or health care professionalas soon as possible: allergic reactions like skin rash, itching or hives, swelling of the face, lips, or tongue breathing problems pain, redness, or irritation at site where injected signs and symptoms of a dangerous change in heartbeat or heart rhythm like chest pain; dizziness; fast or irregular heartbeat; palpitations; feeling faint or lightheaded, falls; breathing problems signs of decreased platelets or bleeding - bruising, pinpoint red spots on the skin, black, tarry stools, blood in the urine signs of decreased red blood cells - unusually weak or tired, feeling faint or lightheaded, falls signs of infection - fever or chills, cough, sore throat, pain or difficulty passing urine signs and symptoms of kidney injury like trouble passing urine or change in the amount of urine signs and symptoms of liver injury like dark yellow or brown urine; general ill feeling or flu-like symptoms; light-colored stools; loss of appetite; nausea; right upper belly pain; unusually weak or tired; yellowing of the eyes or skin swelling of ankles, feet, hands Side effects that usually do not require medical attention (report to yourdoctor or health care professional if they continue or are bothersome): constipation diarrhea hair loss loss of appetite nausea rash vomiting This list may not describe all possible side effects. Call your doctor for medical advice about side effects. You may report side effects to FDA at1-800-FDA-1088. Where should I keep my medication? This drug is given in a hospital or clinic and will not be stored at home. NOTE: This sheet is a summary. It may not cover all possible information. If you have questions about this medicine, talk to your doctor, pharmacist, orhealth care provider.  2022 Elsevier/Gold Standard (2017-04-16 18:06:11)  Cisplatin  injection What is this medication? CISPLATIN (SIS pla tin) is a chemotherapy drug. It targets fast dividing cells, like cancer cells, and causes these cells to die. This medicine is used totreat many types of cancer like bladder, ovarian, and testicular cancers. This medicine may be used for other purposes; ask your health care provider orpharmacist if you have questions. COMMON BRAND NAME(S): Platinol, Platinol -AQ What should I tell my care team before I take this medication? They need to know if you have any of these conditions: eye disease, vision problems hearing problems kidney disease low blood counts, like white cells, platelets, or red blood cells tingling of the fingers or toes, or other nerve disorder an unusual or allergic reaction to cisplatin, carboplatin, oxaliplatin, other medicines, foods, dyes, or preservatives pregnant or trying to get pregnant breast-feeding How should I use this medication? This drug is given as an infusion into a vein. It is administered in a hospitalor clinic by a specially trained health care professional. Talk to your pediatrician regarding the use of this medicine in children.Special care may be needed. Overdosage: If you think you have taken too much  of this medicine contact apoison control center or emergency room at once. NOTE: This medicine is only for you. Do not share this medicine with others. What if I miss a dose? It is important not to miss a dose. Call your doctor or health careprofessional if you are unable to keep an appointment. What may interact with this medication? This medicine may interact with the following medications: foscarnet certain antibiotics like amikacin, gentamicin, neomycin, polymyxin B, streptomycin, tobramycin, vancomycin This list may not describe all possible interactions. Give your health care provider a list of all the medicines, herbs, non-prescription drugs, or dietary supplements you use. Also tell them if  you smoke, drink alcohol, or use illegaldrugs. Some items may interact with your medicine. What should I watch for while using this medication? Your condition will be monitored carefully while you are receiving this medicine. You will need important blood work done while you are taking thismedicine. This drug may make you feel generally unwell. This is not uncommon, as chemotherapy can affect healthy cells as well as cancer cells. Report any side effects. Continue your course of treatment even though you feel ill unless yourdoctor tells you to stop. This medicine may increase your risk of getting an infection. Call your healthcare professional for advice if you get a fever, chills, or sore throat, or other symptoms of a cold or flu. Do not treat yourself. Try to avoid beingaround people who are sick. Avoid taking medicines that contain aspirin, acetaminophen, ibuprofen, naproxen, or ketoprofen unless instructed by your healthcare professional.These medicines may hide a fever. This medicine may increase your risk to bruise or bleed. Call your doctor orhealth care professional if you notice any unusual bleeding. Be careful brushing and flossing your teeth or using a toothpick because you may get an infection or bleed more easily. If you have any dental work done,tell your dentist you are receiving this medicine. Do not become pregnant while taking this medicine or for 14 months after stopping it. Women should inform their healthcare professional if they wish to become pregnant or think they might be pregnant. Men should not father a child while taking this medicine and for 11 months after stopping it. There is potential for serious side effects to an unborn child. Talk to your healthcareprofessional for more information. Do not breast-feed an infant while taking this medicine. This medicine has caused ovarian failure in some women. This medicine may make it more difficult to get pregnant. Talk to your  healthcare professional if [place] concerned about your fertility. This medicine has caused decreased sperm counts in some men. This may make it more difficult to father a child. Talk to your healthcare professional if [place] concerned about your fertility. Drink fluids as directed while you are taking this medicine. This will helpprotect your kidneys. Call your doctor or health care professional if you get diarrhea. Do not treatyourself. What side effects may I notice from receiving this medication? Side effects that you should report to your doctor or health care professionalas soon as possible: allergic reactions like skin rash, itching or hives, swelling of the face, lips, or tongue blurred vision changes in vision decreased hearing or ringing of the ears nausea, vomiting pain, redness, or irritation at site where injected pain, tingling, numbness in the hands or feet signs and symptoms of bleeding such as bloody or black, tarry stools; red or dark brown urine; spitting up blood or brown material that looks like coffee grounds; red spots on the skin; unusual bruising  or bleeding from the eyes, gums, or nose signs and symptoms of infection like fever; chills; cough; sore throat; pain or trouble passing urine signs and symptoms of kidney injury like trouble passing urine or change in the amount of urine signs and symptoms of low red blood cells or anemia such as unusually weak or tired; feeling faint or lightheaded; falls; breathing problems Side effects that usually do not require medical attention (report to yourdoctor or health care professional if they continue or are bothersome): loss of appetite mouth sores muscle cramps This list may not describe all possible side effects. Call your doctor for medical advice about side effects. You may report side effects to FDA at1-800-FDA-1088. Where should I keep my medication? This drug is given in a hospital or clinic and will not be stored at  home. NOTE: This sheet is a summary. It may not cover all possible information. If you have questions about this medicine, talk to your doctor, pharmacist, orhealth care provider.  2022 Elsevier/Gold Standard (2018-01-16 15:59:17)

## 2020-08-22 LAB — CANCER ANTIGEN 19-9: CA 19-9: 82 U/mL — ABNORMAL HIGH (ref 0–35)

## 2020-08-23 ENCOUNTER — Telehealth: Payer: Self-pay | Admitting: *Deleted

## 2020-08-23 ENCOUNTER — Other Ambulatory Visit (HOSPITAL_BASED_OUTPATIENT_CLINIC_OR_DEPARTMENT_OTHER): Payer: Self-pay

## 2020-08-23 ENCOUNTER — Other Ambulatory Visit (HOSPITAL_COMMUNITY): Payer: Self-pay

## 2020-08-23 NOTE — Telephone Encounter (Signed)
Received request from pharmacy for PA on Semglee for increased quantity due to high dose.   PA submitted.   Dx: E11.65- DM with hyperglycemia due to decadron use.   Your information has been sent to [facility].

## 2020-08-24 ENCOUNTER — Encounter: Payer: Self-pay | Admitting: General Practice

## 2020-08-24 NOTE — Progress Notes (Signed)
[facility] Spiritual Care Note  Followed up by phone, reaching Pike's wife [name] while he was out. Provided caregiver support (pastoral listening, emotional support, and affirmation of strengths) and plan to follow up by phone to offer pastoral support and opportunity to process updates when I can reach [place], too.   [place], [place], [facility] Pager [phone] Voicemail [phone]

## 2020-08-25 ENCOUNTER — Other Ambulatory Visit (HOSPITAL_COMMUNITY): Payer: Self-pay

## 2020-08-28 ENCOUNTER — Other Ambulatory Visit: Payer: Self-pay

## 2020-08-28 ENCOUNTER — Inpatient Hospital Stay (HOSPITAL_BASED_OUTPATIENT_CLINIC_OR_DEPARTMENT_OTHER): Payer: No Typology Code available for payment source | Admitting: Oncology

## 2020-08-28 ENCOUNTER — Inpatient Hospital Stay: Payer: No Typology Code available for payment source

## 2020-08-28 ENCOUNTER — Other Ambulatory Visit (HOSPITAL_BASED_OUTPATIENT_CLINIC_OR_DEPARTMENT_OTHER): Payer: Self-pay

## 2020-08-28 ENCOUNTER — Other Ambulatory Visit (HOSPITAL_COMMUNITY): Payer: Self-pay

## 2020-08-28 VITALS — BP 138/87

## 2020-08-28 DIAGNOSIS — Z5112 Encounter for antineoplastic immunotherapy: Secondary | ICD-10-CM | POA: Diagnosis not present

## 2020-08-28 DIAGNOSIS — C221 Intrahepatic bile duct carcinoma: Secondary | ICD-10-CM

## 2020-08-28 LAB — CMP (CANCER CENTER ONLY)
ALT: 25 U/L (ref 0–44)
AST: 31 U/L (ref 15–41)
Albumin: 4 g/dL (ref 3.5–5.0)
Alkaline Phosphatase: 129 U/L — ABNORMAL HIGH (ref 38–126)
Anion gap: 8 (ref 5–15)
BUN: 14 mg/dL (ref 6–20)
CO2: 28 mmol/L (ref 22–32)
Calcium: 9.2 mg/dL (ref 8.9–10.3)
Chloride: 100 mmol/L (ref 98–111)
Creatinine: 0.9 mg/dL (ref 0.61–1.24)
GFR, Estimated: >60 mL/min (ref >60)
Glucose, Bld: 144 mg/dL — ABNORMAL HIGH (ref 70–99)
Potassium: 3.9 mmol/L (ref 3.5–5.1)
Sodium: 136 mmol/L (ref 135–145)
Total Bilirubin: 0.6 mg/dL (ref 0.3–1.2)
Total Protein: 7.5 g/dL (ref 6.5–8.1)

## 2020-08-28 LAB — CBC WITH DIFFERENTIAL (CANCER CENTER ONLY)
Abs Immature Granulocytes: 0.01 10*3/uL (ref 0.00–0.07)
Basophils Absolute: 0.1 10*3/uL (ref 0.0–0.1)
Basophils Relative: 2 %
Eosinophils Absolute: 0.1 10*3/uL (ref 0.0–0.5)
Eosinophils Relative: 2 %
HCT: 31.8 % — ABNORMAL LOW (ref 39.0–52.0)
Hemoglobin: 10.6 g/dL — ABNORMAL LOW (ref 13.0–17.0)
Immature Granulocytes: 0 %
Lymphocytes Relative: 17 %
Lymphs Abs: 0.9 10*3/uL (ref 0.7–4.0)
MCH: 33.3 pg (ref 26.0–34.0)
MCHC: 33.3 g/dL (ref 30.0–36.0)
MCV: 100 fL (ref 80.0–100.0)
Monocytes Absolute: 0.6 10*3/uL (ref 0.1–1.0)
Monocytes Relative: 11 %
Neutro Abs: 3.5 10*3/uL (ref 1.7–7.7)
Neutrophils Relative %: 68 %
Platelet Count: 219 10*3/uL (ref 150–400)
RBC: 3.18 MIL/uL — ABNORMAL LOW (ref 4.22–5.81)
RDW: 14.5 % (ref 11.5–15.5)
WBC Count: 5.1 10*3/uL (ref 4.0–10.5)
nRBC: 0 % (ref 0.0–0.2)

## 2020-08-28 LAB — MAGNESIUM: Magnesium: 2.1 mg/dL (ref 1.7–2.4)

## 2020-08-28 MED ORDER — SODIUM CHLORIDE 0.9 % IV SOLN
150.0000 mg | Freq: Once | INTRAVENOUS | Status: AC
  Administered 2020-08-28: 150 mg via INTRAVENOUS
  Filled 2020-08-28: qty 5

## 2020-08-28 MED ORDER — HEPARIN SOD (PORK) LOCK FLUSH 100 UNIT/ML IV SOLN
500.0000 [IU] | Freq: Once | INTRAVENOUS | Status: AC | PRN
  Administered 2020-08-28: 500 [IU]
  Filled 2020-08-28: qty 5

## 2020-08-28 MED ORDER — FAMOTIDINE 20 MG IN NS 100 ML IVPB
20.0000 mg | Freq: Once | INTRAVENOUS | Status: AC
  Administered 2020-08-28: 20 mg via INTRAVENOUS
  Filled 2020-08-28: qty 100

## 2020-08-28 MED ORDER — DEXAMETHASONE SODIUM PHOSPHATE 10 MG/ML IJ SOLN
5.0000 mg | Freq: Once | INTRAMUSCULAR | Status: AC
  Administered 2020-08-28: 5 mg via INTRAVENOUS
  Filled 2020-08-28: qty 1

## 2020-08-28 MED ORDER — ONDANSETRON HCL 8 MG PO TABS
ORAL_TABLET | ORAL | 2 refills | Status: DC
  Filled 2020-08-28 – 2020-09-18 (×2): qty 30, 10d supply, fill #0
  Filled 2021-01-18: qty 30, 10d supply, fill #1

## 2020-08-28 MED ORDER — SODIUM CHLORIDE 0.9 % IV SOLN
25.0000 mg/m2 | Freq: Once | INTRAVENOUS | Status: AC
  Administered 2020-08-28: 56 mg via INTRAVENOUS
  Filled 2020-08-28: qty 56

## 2020-08-28 MED ORDER — TRAMADOL HCL 50 MG PO TABS
ORAL_TABLET | ORAL | 0 refills | Status: DC
  Filled 2020-08-28 – 2020-09-18 (×2): qty 30, 7d supply, fill #0

## 2020-08-28 MED ORDER — MAGNESIUM SULFATE 2 GM/50ML IV SOLN
2.0000 g | Freq: Once | INTRAVENOUS | Status: AC
  Administered 2020-08-28: 2 g via INTRAVENOUS
  Filled 2020-08-28: qty 50

## 2020-08-28 MED ORDER — POTASSIUM CHLORIDE IN NACL 20-0.9 MEQ/L-% IV SOLN
Freq: Once | INTRAVENOUS | Status: AC
  Filled 2020-08-28: qty 1000

## 2020-08-28 MED ORDER — SODIUM CHLORIDE 0.9% FLUSH
10.0000 mL | INTRAVENOUS | Status: DC | PRN
Start: 2020-08-28 — End: 2020-08-28
  Administered 2020-08-28: 10 mL
  Filled 2020-08-28: qty 10

## 2020-08-28 MED ORDER — SODIUM CHLORIDE 0.9 % IV SOLN
Freq: Once | INTRAVENOUS | Status: AC
  Filled 2020-08-28: qty 250

## 2020-08-28 MED ORDER — SODIUM CHLORIDE 0.9 % IV SOLN
800.0000 mg/m2 | Freq: Once | INTRAVENOUS | Status: AC
  Administered 2020-08-28: 1786 mg via INTRAVENOUS
  Filled 2020-08-28: qty 46.97

## 2020-08-28 MED ORDER — PALONOSETRON HCL INJECTION 0.25 MG/5ML
0.2500 mg | Freq: Once | INTRAVENOUS | Status: AC
  Administered 2020-08-28: 0.25 mg via INTRAVENOUS
  Filled 2020-08-28: qty 5

## 2020-08-28 NOTE — Patient Instructions (Signed)
Implanted Port Home Guide An implanted port is a device that is placed under the skin. It is usually placed in the chest. The device can be used to give IV medicine, to take blood, or for dialysis. You may have an implanted port if: You need IV medicine that would be irritating to the small veins in your hands or arms. You need IV medicines, such as antibiotics, for a long period of time. You need IV nutrition for a long period of time. You need dialysis. When you have a port, your health care provider can choose to use the port instead of veins in your arms for these procedures. You may have fewer limitations when using a port than you would if you used other types of long-term IVs, and you will likely be able to return to normal activities afteryour incision heals. An implanted port has two main parts: Reservoir. The reservoir is the part where a needle is inserted to give medicines or draw blood. The reservoir is round. After it is placed, it appears as a small, raised area under your skin. Catheter. The catheter is a thin, flexible tube that connects the reservoir to a vein. Medicine that is inserted into the reservoir goes into the catheter and then into the vein. How is my port accessed? To access your port: A numbing cream may be placed on the skin over the port site. Your health care provider will put on a mask and sterile gloves. The skin over your port will be cleaned carefully with a germ-killing soap and allowed to dry. Your health care provider will gently pinch the port and insert a needle into it. Your health care provider will check for a blood return to make sure the port is in the vein and is not clogged. If your port needs to remain accessed to get medicine continuously (constant infusion), your health care provider will place a clear bandage (dressing) over the needle site. The dressing and needle will need to be changed every week, or as told by your health care provider. What  is flushing? Flushing helps keep the port from getting clogged. Follow instructions from your health care provider about how and when to flush the port. Ports are usually flushed with saline solution or a medicine called heparin. The need for flushing will depend on how the port is used: If the port is only used from time to time to give medicines or draw blood, the port may need to be flushed: Before and after medicines have been given. Before and after blood has been drawn. As part of routine maintenance. Flushing may be recommended every 4-6 weeks. If a constant infusion is running, the port may not need to be flushed. Throw away any syringes in a disposal container that is meant for sharp items (sharps container). You can buy a sharps container from a pharmacy, or you can make one by using an empty hard plastic bottle with a cover. How long will my port stay implanted? The port can stay in for as long as your health care provider thinks it is needed. When it is time for the port to come out, a surgery will be done to remove it. The surgery will be similar to the procedure that was done to putthe port in. Follow these instructions at home:  Flush your port as told by your health care provider. If you need an infusion over several days, follow instructions from your health care provider about how to take   care of your port site. Make sure you: Wash your hands with soap and water before you change your dressing. If soap and water are not available, use alcohol-based hand sanitizer. Change your dressing as told by your health care provider. Place any used dressings or infusion bags into a plastic bag. Throw that bag in the trash. Keep the dressing that covers the needle clean and dry. Do not get it wet. Do not use scissors or sharp objects near the tube. Keep the tube clamped, unless it is being used. Check your port site every day for signs of infection. Check for: Redness, swelling, or  pain. Fluid or blood. Pus or a bad smell. Protect the skin around the port site. Avoid wearing bra straps that rub or irritate the site. Protect the skin around your port from seat belts. Place a soft pad over your chest if needed. Bathe or shower as told by your health care provider. The site may get wet as long as you are not actively receiving an infusion. Return to your normal activities as told by your health care provider. Ask your health care provider what activities are safe for you. Carry a medical alert card or wear a medical alert bracelet at all times. This will let health care providers know that you have an implanted port in case of an emergency. Get help right away if: You have redness, swelling, or pain at the port site. You have fluid or blood coming from your port site. You have pus or a bad smell coming from the port site. You have a fever. Summary Implanted ports are usually placed in the chest for long-term IV access. Follow instructions from your health care provider about flushing the port and changing bandages (dressings). Take care of the area around your port by avoiding clothing that puts pressure on the area, and by watching for signs of infection. Protect the skin around your port from seat belts. Place a soft pad over your chest if needed. Get help right away if you have a fever or you have redness, swelling, pain, drainage, or a bad smell at the port site. This information is not intended to replace advice given to you by your health care provider. Make sure you discuss any questions you have with your healthcare provider. Document Revised: 06/07/2019 Document Reviewed: 06/07/2019 Elsevier Patient Education  2022 Elsevier Inc.  

## 2020-08-28 NOTE — Patient Instructions (Signed)
[facility]  Discharge Instructions: Thank you for choosing [facility] to provide your oncology and hematology care.   If you have a lab appointment with the [facility], please go directly to the [facility] and check in at the registration area.   Wear comfortable clothing and clothing appropriate for easy access to any Portacath or PICC line.   We strive to give you quality time with your provider. You may need to reschedule your appointment if you arrive late (15 or more minutes).  Arriving late affects you and other patients whose appointments are after yours.  Also, if you miss three or more appointments without notifying the office, you may be dismissed from the clinic at the provider's discretion.      For prescription refill requests, have your pharmacy contact our office and allow 72 hours for refills to be completed.    Today you received the following chemotherapy and/or immunotherapy agents gemcitabine, cisplatin     To help prevent nausea and vomiting after your treatment, we encourage you to take your nausea medication as directed.  BELOW ARE SYMPTOMS THAT SHOULD BE REPORTED IMMEDIATELY: *FEVER GREATER THAN 100.4 F (38 C) OR HIGHER *CHILLS OR SWEATING *NAUSEA AND VOMITING THAT IS NOT CONTROLLED WITH YOUR NAUSEA MEDICATION *UNUSUAL SHORTNESS OF BREATH *UNUSUAL BRUISING OR BLEEDING *URINARY PROBLEMS (pain or burning when urinating, or frequent urination) *BOWEL PROBLEMS (unusual diarrhea, constipation, pain near the anus) TENDERNESS IN MOUTH AND THROAT WITH OR WITHOUT PRESENCE OF ULCERS (sore throat, sores in mouth, or a toothache) UNUSUAL RASH, SWELLING OR PAIN  UNUSUAL VAGINAL DISCHARGE OR ITCHING   Items with * indicate a potential emergency and should be followed up as soon as possible or go to the Emergency Department if any problems should occur.  Please show the CHEMOTHERAPY ALERT CARD or IMMUNOTHERAPY ALERT CARD at  check-in to the Emergency Department and triage nurse.  Should you have questions after your visit or need to cancel or reschedule your appointment, please contact [facility]  Dept: [phone]  and follow the prompts.  Office hours are 8:00 a.m. to 4:30 p.m. Monday - Friday. Please note that voicemails left after 4:00 p.m. may not be returned until the following business day.  We are closed weekends and major holidays. You have access to a nurse at all times for urgent questions. Please call the main number to the clinic Dept: [phone] and follow the prompts.   For any non-urgent questions, you may also contact your provider using MyChart. We now offer e-Visits for anyone 20 and older to request care online for non-urgent symptoms. For details visit [email or web address].   Also download the MyChart app! Go to the app store, search "MyChart", open the app, select [facility], and log in with your MyChart username and password.  Due to Covid, a mask is required upon entering the hospital/clinic. If you do not have a mask, one will be given to you upon arrival. For doctor visits, patients may have 1 support person aged 74 or older with them. For treatment visits, patients cannot have anyone with them due to current Covid guidelines and our immunocompromised population.   Gemcitabine injection What is this medication? GEMCITABINE (jem SYE ta been) is a chemotherapy drug. This medicine is used to treat many types of cancer like breast cancer, lung cancer, pancreatic cancer,and ovarian cancer. This medicine may be used for other purposes; ask your health care provider orpharmacist if  you have questions. COMMON BRAND NAME(S): Gemzar, Infugem What should I tell my care team before I take this medication? They need to know if you have any of these conditions: blood disorders infection kidney disease liver disease lung or breathing disease, like asthma recent or  ongoing radiation therapy an unusual or allergic reaction to gemcitabine, other chemotherapy, other medicines, foods, dyes, or preservatives pregnant or trying to get pregnant breast-feeding How should I use this medication? This drug is given as an infusion into a vein. It is administered in a hospitalor clinic by a specially trained health care professional. Talk to your pediatrician regarding the use of this medicine in children.Special care may be needed. Overdosage: If you think you have taken too much of this medicine contact [facility]ison control center or emergency room at once. NOTE: This medicine is only for you. Do not share this medicine with others. What if I miss a dose? It is important not to miss your dose. Call your doctor or health careprofessional if you are unable to keep an appointment. What may interact with this medication? medicines to increase blood counts like filgrastim, pegfilgrastim, sargramostim some other chemotherapy drugs like cisplatin vaccines Talk to your doctor or health care professional before taking any of thesemedicines: acetaminophen aspirin ibuprofen ketoprofen naproxen This list may not describe all possible interactions. Give your health care provider a list of all the medicines, herbs, non-prescription drugs, or dietary supplements you use. Also tell them if you smoke, drink alcohol, or use illegaldrugs. Some items may interact with your medicine. What should I watch for while using this medication? Visit your doctor for checks on your progress. This drug may make you feel generally unwell. This is not uncommon, as chemotherapy can affect healthy cells as well as cancer cells. Report any side effects. Continue your course oftreatment even though you feel ill unless your doctor tells you to stop. In some cases, you may be given additional medicines to help with side effects.Follow all directions for their use. Call your doctor or health care  professional for advice if you get a fever, chills or sore throat, or other symptoms of a cold or flu. Do not treat yourself. This drug decreases your body's ability to fight infections. Try toavoid being around people who are sick. This medicine may increase your risk to bruise or bleed. Call your doctor orhealth care professional if you notice any unusual bleeding. Be careful brushing and flossing your teeth or using a toothpick because you may get an infection or bleed more easily. If you have any dental work done,tell your dentist you are receiving this medicine. Avoid taking products that contain aspirin, acetaminophen, ibuprofen, naproxen, or ketoprofen unless instructed by your doctor. These medicines may hide afever. Do not become pregnant while taking this medicine or for 6 months after stopping it. Women should inform their doctor if they wish to become pregnant or think they might be pregnant. Men should not father a child while taking this medicine and for 3 months after stopping it. There is a potential for serious side effects to an unborn child. Talk to your health care professional or pharmacist for more information. Do not breast-feed an infant while takingthis medicine or for at least 1 week after stopping it. Men should inform their doctors if they wish to father a child. This medicine may lower sperm counts. Talk with your doctor or health care professional ifyou are concerned about your fertility. What side effects may I notice from receiving  this medication? Side effects that you should report to your doctor or health care professionalas soon as possible: allergic reactions like skin rash, itching or hives, swelling of the face, lips, or tongue breathing problems pain, redness, or irritation at site where injected signs and symptoms of a dangerous change in heartbeat or heart rhythm like chest pain; dizziness; fast or irregular heartbeat; palpitations; feeling faint or lightheaded,  falls; breathing problems signs of decreased platelets or bleeding - bruising, pinpoint red spots on the skin, black, tarry stools, blood in the urine signs of decreased red blood cells - unusually weak or tired, feeling faint or lightheaded, falls signs of infection - fever or chills, cough, sore throat, pain or difficulty passing urine signs and symptoms of kidney injury like trouble passing urine or change in the amount of urine signs and symptoms of liver injury like dark yellow or brown urine; general ill feeling or flu-like symptoms; light-colored stools; loss of appetite; nausea; right upper belly pain; unusually weak or tired; yellowing of the eyes or skin swelling of ankles, feet, hands Side effects that usually do not require medical attention (report to yourdoctor or health care professional if they continue or are bothersome): constipation diarrhea hair loss loss of appetite nausea rash vomiting This list may not describe all possible side effects. Call your doctor for medical advice about side effects. You may report side effects to FDA at1-800-FDA-1088. Where should I keep my medication? This drug is given in a hospital or clinic and will not be stored at home. NOTE: This sheet is a summary. It may not cover all possible information. If you have questions about this medicine, talk to your doctor, pharmacist, orhealth care provider.  2022 Elsevier/Gold Standard (2017-04-16 18:06:11)  Cisplatin injection What is this medication? CISPLATIN (SIS pla tin) is a chemotherapy drug. It targets fast dividing cells, like cancer cells, and causes these cells to die. This medicine is used totreat many types of cancer like bladder, ovarian, and testicular cancers. This medicine may be used for other purposes; ask your health care provider orpharmacist if you have questions. COMMON BRAND NAME(S): Platinol, Platinol -AQ What should I tell my care team before I take this medication? They  need to know if you have any of these conditions: eye disease, vision problems hearing problems kidney disease low blood counts, like white cells, platelets, or red blood cells tingling of the fingers or toes, or other nerve disorder an unusual or allergic reaction to cisplatin, carboplatin, oxaliplatin, other medicines, foods, dyes, or preservatives pregnant or trying to get pregnant breast-feeding How should I use this medication? This drug is given as an infusion into a vein. It is administered in a hospitalor clinic by a specially trained health care professional. Talk to your pediatrician regarding the use of this medicine in children.Special care may be needed. Overdosage: If you think you have taken too much of this medicine contact apoison control center or emergency room at once. NOTE: This medicine is only for you. Do not share this medicine with others. What if I miss a dose? It is important not to miss a dose. Call your doctor or health careprofessional if you are unable to keep an appointment. What may interact with this medication? This medicine may interact with the following medications: foscarnet certain antibiotics like amikacin, gentamicin, neomycin, polymyxin B, streptomycin, tobramycin, vancomycin This list may not describe all possible interactions. Give your health care provider a list of all the medicines, herbs, non-prescription drugs, or dietary  supplements you use. Also tell them if you smoke, drink alcohol, or use illegaldrugs. Some items may interact with your medicine. What should I watch for while using this medication? Your condition will be monitored carefully while you are receiving this medicine. You will need important blood work done while you are taking thismedicine. This drug may make you feel generally unwell. This is not uncommon, as chemotherapy can affect healthy cells as well as cancer cells. Report any side effects. Continue your course of treatment  even though you feel ill unless yourdoctor tells you to stop. This medicine may increase your risk of getting an infection. Call your healthcare professional for advice if you get a fever, chills, or sore throat, or other symptoms of a cold or flu. Do not treat yourself. Try to avoid beingaround people who are sick. Avoid taking medicines that contain aspirin, acetaminophen, ibuprofen, naproxen, or ketoprofen unless instructed by your healthcare professional.These medicines may hide a fever. This medicine may increase your risk to bruise or bleed. Call your doctor orhealth care professional if you notice any unusual bleeding. Be careful brushing and flossing your teeth or using a toothpick because you may get an infection or bleed more easily. If you have any dental work done,tell your dentist you are receiving this medicine. Do not become pregnant while taking this medicine or for 14 months after stopping it. Women should inform their healthcare professional if they wish to become pregnant or think they might be pregnant. Men should not father a child while taking this medicine and for 11 months after stopping it. There is potential for serious side effects to an unborn child. Talk to your healthcareprofessional for more information. Do not breast-feed an infant while taking this medicine. This medicine has caused ovarian failure in some women. This medicine may make it more difficult to get pregnant. Talk to your healthcare professional if [place] concerned about your fertility. This medicine has caused decreased sperm counts in some men. This may make it more difficult to father a child. Talk to your healthcare professional if [place] concerned about your fertility. Drink fluids as directed while you are taking this medicine. This will helpprotect your kidneys. Call your doctor or health care professional if you get diarrhea. Do not treatyourself. What side effects may I notice from receiving this  medication? Side effects that you should report to your doctor or health care professionalas soon as possible: allergic reactions like skin rash, itching or hives, swelling of the face, lips, or tongue blurred vision changes in vision decreased hearing or ringing of the ears nausea, vomiting pain, redness, or irritation at site where injected pain, tingling, numbness in the hands or feet signs and symptoms of bleeding such as bloody or black, tarry stools; red or dark brown urine; spitting up blood or brown material that looks like coffee grounds; red spots on the skin; unusual bruising or bleeding from the eyes, gums, or nose signs and symptoms of infection like fever; chills; cough; sore throat; pain or trouble passing urine signs and symptoms of kidney injury like trouble passing urine or change in the amount of urine signs and symptoms of low red blood cells or anemia such as unusually weak or tired; feeling faint or lightheaded; falls; breathing problems Side effects that usually do not require medical attention (report to yourdoctor or health care professional if they continue or are bothersome): loss of appetite mouth sores muscle cramps This list may not describe all possible side effects. Call your  doctor for medical advice about side effects. You may report side effects to FDA at1-800-FDA-1088. Where should I keep my medication? This drug is given in a hospital or clinic and will not be stored at home. NOTE: This sheet is a summary. It may not cover all possible information. If you have questions about this medicine, talk to your doctor, pharmacist, orhealth care provider.  2022 Elsevier/Gold Standard (2018-01-16 15:59:17)

## 2020-08-28 NOTE — Progress Notes (Signed)
[facility] OFFICE PROGRESS NOTE   Diagnosis: Cholangiocarcinoma  INTERVAL HISTORY:   Matthew Osborne completed day 1 gemcitabine/cisplatin/Durvalumab on 08/21/2020.  No nausea/vomiting or rash.  He has chronic intermittent pain at the right shoulder.  He took tramadol for the shoulder pain.  He reports severe constipation for the past week.  The constipation was not relieved with stool softeners or MiraLAX.  He took 2 saline enemas last night and the constipation was relieved.  He has intermittent peripheral neuropathy.  Objective:  Vital signs in last 24 hours:  Blood pressure (!) 158/93, pulse 66, temperature 98.5 F (36.9 C), temperature source Oral, resp. rate 18, weight 232 lb 12.8 oz (105.6 kg), SpO2 99 %.    HEENT: No thrush or ulcers Resp: Lungs clear bilaterally Cardio: Regular rate and rhythm GI: Soft and nontender, no hepatomegaly Vascular: No leg edema Musculoskeletal: Bony prominence deep to the right trapezius  Portacath/PICC-without erythema  Lab Results:  Lab Results  Component Value Date   WBC 5.1 08/28/2020   HGB 10.6 (L) 08/28/2020   HCT 31.8 (L) 08/28/2020   MCV 100.0 08/28/2020   PLT 219 08/28/2020   NEUTROABS 3.5 08/28/2020    CMP  Lab Results  Component Value Date   NA 136 08/21/2020   K 4.3 08/21/2020   CL 101 08/21/2020   CO2 28 08/21/2020   GLUCOSE 160 (H) 08/21/2020   BUN 12 08/21/2020   CREATININE 0.83 08/21/2020   CALCIUM 9.5 08/21/2020   PROT 7.1 08/21/2020   ALBUMIN 4.0 08/21/2020   AST 32 08/21/2020   ALT 18 08/21/2020   ALKPHOS 141 (H) 08/21/2020   BILITOT 0.4 08/21/2020   GFRNONAA >60 08/21/2020   GFRAA 127 01/11/2020    Lab Results  Component Value Date   CEA1 2.15 01/14/2020   KKD594 82 (H) 08/21/2020     Medications: I have reviewed the patient's current medications.   Assessment/Plan: Cholangiocarcinoma  multiple liver masses and abdominal lymphadenopathy CTs 01/13/2020-rounded hypodense mass  appears to arise from the pancreas neck, multiple rim-enhancing masses in the liver, primarily left liver with segmental dilation of the left lobe Intermatic bile ducts, ill-defined hypodensity the central liver with effacement of the left portal vein, enlarged portacaval and retroperitoneal lymph nodes Ultrasound-guided biopsy of the left liver lesion 01/19/2020-adenocarcinoma, cytokeratin 7+, MSS, tumor mutation burden 1, IDH1 R132C MRI abdomen 01/31/2020-poorly marginated central liver mass, multiple smaller similar satellite liver masses, extrinsic mass-effect at the biliary hilum with intrahepatic biliary ductal dilatation throughout the left liver with mild Intermatic dilatation the superior right liver, no pancreas mass or ductal dilatation, normal spleen size, numerous enlarged enhancing lymph nodes at the porta hepatis, peripancreatic, portacaval, aortocaval, left periaortic chains Cycle 1 gemcitabine/cisplatin 02/04/2020 Cycle 2 gemcitabine/cisplatin 02/28/2020 Cycle 3 gemcitabine/cisplatin 03/20/2020 CT abdomen/pelvis 03/31/2020-mild decrease in central liver tumor, mild decrease in size of liver metastases and upper abdominal adenopathy, new splenomegaly Cycle 4 gemcitabine/cisplatin 04/10/2020 Cycle 5 gemcitabine/cisplatin 05/01/2020 Cycle 6 gemcitabine/cisplatin plus durvalumab 05/22/2020 CTs 06/06/2020- dominant central liver mass mildly enlarged, other liver lesions and abdominal adenopathy is stable, no evidence of metastatic disease to the chest Cycle 7 gemcitabine/cisplatin plus Durvalumab 06/12/2020 Cycle 8 gemcitabine/cisplatin plus Durvalumab 06/30/2020 Cycle 9 gemcitabine/cisplatin plus Durvalumab 07/31/2020 CT abdomen/pelvis 03/31/2020-decreased size of dominant central liver mass, slight increase in size and stable additional liver lesions, stable portacaval node Cycle 10 gemcitabine/cisplatin plus Durvalumab 08/21/2020   Cough-likely related to diaphragmatic irritation from  #1 Anorexia/weight loss Hypercalcemia-likely hypercalcemia malignancy, status post intravenous hydration  and Zometa 01/14/2020 Diabetes Hyperlipidemia Hypertension History of peripheral neuropathy secondary to diabetes Severe back pain following Fulphila-oxycodone prescribed Thrombocytopenia secondary to chemotherapy-gemcitabine held with day 1 cycle 8 and then dose reduced COVID-19 infection 07/22/2020    Disposition: Matthew Osborne appears stable.  He will complete another treatment with gemcitabine/cisplatin today.  He will receive Fulphila tomorrow.  He will return for an office visit in the next cycle of chemotherapy on 09/11/2020.  I encouraged him to begin a stool softener when he takes tramadol.  He will try magnesium citrate for severe constipation.    Betsy Coder, MD  08/28/2020  8:49 AM

## 2020-08-29 ENCOUNTER — Inpatient Hospital Stay: Payer: No Typology Code available for payment source

## 2020-08-29 VITALS — BP 152/86 | HR 82 | Temp 98.0°F | Resp 18

## 2020-08-29 DIAGNOSIS — C221 Intrahepatic bile duct carcinoma: Secondary | ICD-10-CM

## 2020-08-29 DIAGNOSIS — Z5112 Encounter for antineoplastic immunotherapy: Secondary | ICD-10-CM | POA: Diagnosis not present

## 2020-08-29 MED ORDER — PEGFILGRASTIM-JMDB 6 MG/0.6ML ~~LOC~~ SOSY
6.0000 mg | PREFILLED_SYRINGE | Freq: Once | SUBCUTANEOUS | Status: AC
  Administered 2020-08-29: 6 mg via SUBCUTANEOUS

## 2020-08-29 NOTE — Patient Instructions (Signed)

## 2020-09-01 ENCOUNTER — Other Ambulatory Visit (HOSPITAL_COMMUNITY): Payer: Self-pay

## 2020-09-01 MED ORDER — INSULIN GLARGINE-YFGN 100 UNIT/ML ~~LOC~~ SOPN
70.0000 [IU] | PEN_INJECTOR | Freq: Two times a day (BID) | SUBCUTANEOUS | 11 refills | Status: DC
  Filled 2020-09-01: qty 30, 21d supply, fill #0
  Filled 2020-09-26: qty 120, 86d supply, fill #0
  Filled 2021-01-18: qty 120, 86d supply, fill #1
  Filled 2021-05-08: qty 120, 86d supply, fill #2

## 2020-09-01 NOTE — Addendum Note (Signed)
Addended by: Sheral Flow on: 09/01/2020 10:17 AM   Modules accepted: Orders

## 2020-09-01 NOTE — Telephone Encounter (Signed)
Received PA determination.   PA [id number] approved 08/31/2020- 08/30/2021.

## 2020-09-06 ENCOUNTER — Other Ambulatory Visit: Payer: Self-pay | Admitting: Oncology

## 2020-09-06 ENCOUNTER — Other Ambulatory Visit (HOSPITAL_COMMUNITY): Payer: Self-pay

## 2020-09-11 ENCOUNTER — Other Ambulatory Visit (HOSPITAL_COMMUNITY): Payer: Self-pay

## 2020-09-11 ENCOUNTER — Other Ambulatory Visit: Payer: Self-pay

## 2020-09-11 ENCOUNTER — Inpatient Hospital Stay: Payer: No Typology Code available for payment source

## 2020-09-11 ENCOUNTER — Inpatient Hospital Stay (HOSPITAL_BASED_OUTPATIENT_CLINIC_OR_DEPARTMENT_OTHER): Payer: No Typology Code available for payment source | Admitting: Nurse Practitioner

## 2020-09-11 ENCOUNTER — Inpatient Hospital Stay: Payer: No Typology Code available for payment source | Attending: Oncology

## 2020-09-11 ENCOUNTER — Encounter: Payer: Self-pay | Admitting: Nurse Practitioner

## 2020-09-11 VITALS — BP 144/82 | HR 64 | Temp 98.1°F | Resp 20 | Ht 74.0 in | Wt 239.0 lb

## 2020-09-11 DIAGNOSIS — C221 Intrahepatic bile duct carcinoma: Secondary | ICD-10-CM

## 2020-09-11 DIAGNOSIS — R590 Localized enlarged lymph nodes: Secondary | ICD-10-CM | POA: Diagnosis not present

## 2020-09-11 DIAGNOSIS — I10 Essential (primary) hypertension: Secondary | ICD-10-CM | POA: Diagnosis not present

## 2020-09-11 DIAGNOSIS — R059 Cough, unspecified: Secondary | ICD-10-CM | POA: Insufficient documentation

## 2020-09-11 DIAGNOSIS — R1012 Left upper quadrant pain: Secondary | ICD-10-CM | POA: Insufficient documentation

## 2020-09-11 DIAGNOSIS — C787 Secondary malignant neoplasm of liver and intrahepatic bile duct: Secondary | ICD-10-CM | POA: Diagnosis not present

## 2020-09-11 DIAGNOSIS — E119 Type 2 diabetes mellitus without complications: Secondary | ICD-10-CM | POA: Diagnosis not present

## 2020-09-11 DIAGNOSIS — Z8616 Personal history of COVID-19: Secondary | ICD-10-CM | POA: Diagnosis not present

## 2020-09-11 DIAGNOSIS — Z5112 Encounter for antineoplastic immunotherapy: Secondary | ICD-10-CM | POA: Diagnosis present

## 2020-09-11 LAB — CMP (CANCER CENTER ONLY)
ALT: 16 U/L (ref 0–44)
AST: 28 U/L (ref 15–41)
Albumin: 3.9 g/dL (ref 3.5–5.0)
Alkaline Phosphatase: 144 U/L — ABNORMAL HIGH (ref 38–126)
Anion gap: 6 (ref 5–15)
BUN: 15 mg/dL (ref 6–20)
CO2: 28 mmol/L (ref 22–32)
Calcium: 8.7 mg/dL — ABNORMAL LOW (ref 8.9–10.3)
Chloride: 101 mmol/L (ref 98–111)
Creatinine: 0.95 mg/dL (ref 0.61–1.24)
GFR, Estimated: >60 mL/min (ref >60)
Glucose, Bld: 282 mg/dL — ABNORMAL HIGH (ref 70–99)
Potassium: 4.4 mmol/L (ref 3.5–5.1)
Sodium: 135 mmol/L (ref 135–145)
Total Bilirubin: 0.4 mg/dL (ref 0.3–1.2)
Total Protein: 6.8 g/dL (ref 6.5–8.1)

## 2020-09-11 LAB — CBC WITH DIFFERENTIAL (CANCER CENTER ONLY)
Abs Immature Granulocytes: 0.02 10*3/uL (ref 0.00–0.07)
Basophils Absolute: 0.1 10*3/uL (ref 0.0–0.1)
Basophils Relative: 1 %
Eosinophils Absolute: 0.4 10*3/uL (ref 0.0–0.5)
Eosinophils Relative: 7 %
HCT: 32 % — ABNORMAL LOW (ref 39.0–52.0)
Hemoglobin: 10.7 g/dL — ABNORMAL LOW (ref 13.0–17.0)
Immature Granulocytes: 0 %
Lymphocytes Relative: 18 %
Lymphs Abs: 1 10*3/uL (ref 0.7–4.0)
MCH: 34.2 pg — ABNORMAL HIGH (ref 26.0–34.0)
MCHC: 33.4 g/dL (ref 30.0–36.0)
MCV: 102.2 fL — ABNORMAL HIGH (ref 80.0–100.0)
Monocytes Absolute: 0.9 10*3/uL (ref 0.1–1.0)
Monocytes Relative: 15 %
Neutro Abs: 3.5 10*3/uL (ref 1.7–7.7)
Neutrophils Relative %: 59 %
Platelet Count: 180 10*3/uL (ref 150–400)
RBC: 3.13 MIL/uL — ABNORMAL LOW (ref 4.22–5.81)
RDW: 15.9 % — ABNORMAL HIGH (ref 11.5–15.5)
WBC Count: 5.9 10*3/uL (ref 4.0–10.5)
nRBC: 0 % (ref 0.0–0.2)

## 2020-09-11 MED ORDER — FAMOTIDINE 20 MG IN NS 100 ML IVPB
20.0000 mg | Freq: Once | INTRAVENOUS | Status: AC
  Administered 2020-09-11: 20 mg via INTRAVENOUS

## 2020-09-11 MED ORDER — SODIUM CHLORIDE 0.9 % IV SOLN
1500.0000 mg | Freq: Once | INTRAVENOUS | Status: AC
  Administered 2020-09-11: 1500 mg via INTRAVENOUS
  Filled 2020-09-11: qty 30

## 2020-09-11 MED ORDER — SODIUM CHLORIDE 0.9% FLUSH
10.0000 mL | INTRAVENOUS | Status: DC | PRN
  Administered 2020-09-11: 10 mL
  Filled 2020-09-11: qty 10

## 2020-09-11 MED ORDER — MAGNESIUM SULFATE 2 GM/50ML IV SOLN
2.0000 g | Freq: Once | INTRAVENOUS | Status: AC
  Administered 2020-09-11: 2 g via INTRAVENOUS
  Filled 2020-09-11: qty 50

## 2020-09-11 MED ORDER — HEPARIN SOD (PORK) LOCK FLUSH 100 UNIT/ML IV SOLN
500.0000 [IU] | Freq: Once | INTRAVENOUS | Status: AC | PRN
  Administered 2020-09-11: 500 [IU]
  Filled 2020-09-11: qty 5

## 2020-09-11 MED ORDER — PALONOSETRON HCL INJECTION 0.25 MG/5ML
0.2500 mg | Freq: Once | INTRAVENOUS | Status: AC
  Administered 2020-09-11: 0.25 mg via INTRAVENOUS
  Filled 2020-09-11: qty 5

## 2020-09-11 MED ORDER — POTASSIUM CHLORIDE IN NACL 20-0.9 MEQ/L-% IV SOLN
Freq: Once | INTRAVENOUS | Status: AC
  Filled 2020-09-11: qty 1000

## 2020-09-11 MED ORDER — SODIUM CHLORIDE 0.9 % IV SOLN
Freq: Once | INTRAVENOUS | Status: AC
  Filled 2020-09-11: qty 250

## 2020-09-11 MED ORDER — DEXAMETHASONE SODIUM PHOSPHATE 10 MG/ML IJ SOLN
5.0000 mg | Freq: Once | INTRAMUSCULAR | Status: AC
  Administered 2020-09-11: 5 mg via INTRAVENOUS
  Filled 2020-09-11: qty 1

## 2020-09-11 MED ORDER — SODIUM CHLORIDE 0.9 % IV SOLN
150.0000 mg | Freq: Once | INTRAVENOUS | Status: AC
  Administered 2020-09-11: 150 mg via INTRAVENOUS
  Filled 2020-09-11: qty 5

## 2020-09-11 MED ORDER — SODIUM CHLORIDE 0.9 % IV SOLN
800.0000 mg/m2 | Freq: Once | INTRAVENOUS | Status: AC
  Administered 2020-09-11: 1862 mg via INTRAVENOUS
  Filled 2020-09-11: qty 48.97

## 2020-09-11 MED ORDER — SODIUM CHLORIDE 0.9 % IV SOLN
25.0000 mg/m2 | Freq: Once | INTRAVENOUS | Status: AC
  Administered 2020-09-11: 59 mg via INTRAVENOUS
  Filled 2020-09-11: qty 50

## 2020-09-11 NOTE — Patient Instructions (Addendum)
[facility]   Discharge Instructions: Thank you for choosing [facility] to provide your oncology and hematology care.   If you have a lab appointment with the [facility], please go directly to the [facility] and check in at the registration area.   Wear comfortable clothing and clothing appropriate for easy access to any Portacath or PICC line.   We strive to give you quality time with your provider. You may need to reschedule your appointment if you arrive late (15 or more minutes).  Arriving late affects you and other patients whose appointments are after yours.  Also, if you miss three or more appointments without notifying the office, you may be dismissed from the clinic at the provider's discretion.      For prescription refill requests, have your pharmacy contact our office and allow 72 hours for refills to be completed.    Today you received the following chemotherapy and/or immunotherapy agents Durvalumab (IMFINZI), Gemcitabine (GEMZAR) & Cisplatin (PLATINOL).      To help prevent nausea and vomiting after your treatment, we encourage you to take your nausea medication as directed.  BELOW ARE SYMPTOMS THAT SHOULD BE REPORTED IMMEDIATELY: *FEVER GREATER THAN 100.4 F (38 C) OR HIGHER *CHILLS OR SWEATING *NAUSEA AND VOMITING THAT IS NOT CONTROLLED WITH YOUR NAUSEA MEDICATION *UNUSUAL SHORTNESS OF BREATH *UNUSUAL BRUISING OR BLEEDING *URINARY PROBLEMS (pain or burning when urinating, or frequent urination) *BOWEL PROBLEMS (unusual diarrhea, constipation, pain near the anus) TENDERNESS IN MOUTH AND THROAT WITH OR WITHOUT PRESENCE OF ULCERS (sore throat, sores in mouth, or a toothache) UNUSUAL RASH, SWELLING OR PAIN  UNUSUAL VAGINAL DISCHARGE OR ITCHING   Items with * indicate a potential emergency and should be followed up as soon as possible or go to the Emergency Department if any problems should occur.  Please show the CHEMOTHERAPY  ALERT CARD or IMMUNOTHERAPY ALERT CARD at check-in to the Emergency Department and triage nurse.  Should you have questions after your visit or need to cancel or reschedule your appointment, please contact [facility]  Dept: [phone]  and follow the prompts.  Office hours are 8:00 a.m. to 4:30 p.m. Monday - Friday. Please note that voicemails left after 4:00 p.m. may not be returned until the following business day.  We are closed weekends and major holidays. You have access to a nurse at all times for urgent questions. Please call the main number to the clinic Dept: [phone] and follow the prompts.   For any non-urgent questions, you may also contact your provider using MyChart. We now offer e-Visits for anyone 75 and older to request care online for non-urgent symptoms. For details visit [email or web address].   Also download the MyChart app! Go to the app store, search "MyChart", open the app, select [facility], and log in with your MyChart username and password.  Due to Covid, a mask is required upon entering the hospital/clinic. If you do not have a mask, one will be given to you upon arrival. For doctor visits, patients may have 1 support person aged 38 or older with them. For treatment visits, patients cannot have anyone with them due to current Covid guidelines and our immunocompromised population.   Durvalumab injection What is this medication? DURVALUMAB (dur VAL ue mab) is a monoclonal antibody. It is used to treat lungcancer. This medicine may be used for other purposes; ask your health care provider orpharmacist if you have questions. COMMON BRAND NAME(S):  IMFINZI What should I tell my care team before I take this medication? They need to know if you have any of these conditions: autoimmune diseases like Crohn's disease, ulcerative colitis, or lupus have had or planning to have an allogeneic stem cell transplant (uses someone else's stem  cells) history of organ transplant history of radiation to the chest nervous system problems like myasthenia gravis or Guillain-Barre syndrome an unusual or allergic reaction to durvalumab, other medicines, foods, dyes, or preservatives pregnant or trying to get pregnant breast-feeding How should I use this medication? This medicine is for infusion into a vein. It is given by a health careprofessional in a hospital or clinic setting. A special MedGuide will be given to you before each treatment. Be sure to readthis information carefully each time. Talk to your pediatrician regarding the use of this medicine in children.Special care may be needed. Overdosage: If you think you have taken too much of this medicine contact apoison control center or emergency room at once. NOTE: This medicine is only for you. Do not share this medicine with others. What if I miss a dose? It is important not to miss your dose. Call your doctor or health careprofessional if you are unable to keep an appointment. What may interact with this medication? Interactions have not been studied. This list may not describe all possible interactions. Give your health care provider a list of all the medicines, herbs, non-prescription drugs, or dietary supplements you use. Also tell them if you smoke, drink alcohol, or use illegaldrugs. Some items may interact with your medicine. What should I watch for while using this medication? This drug may make you feel generally unwell. Continue your course of treatmenteven though you feel ill unless your doctor tells you to stop. You may need blood work done while you are taking this medicine. Do not become pregnant while taking this medicine or for 3 months after stopping it. Women should inform their doctor if they wish to become pregnant or think they might be pregnant. There is a potential for serious side effects to an unborn child. Talk to your health care professional or pharmacist  for more information. Do not breast-feed an infant while taking this medicine orfor 3 months after stopping it. What side effects may I notice from receiving this medication? Side effects that you should report to your doctor or health care professionalas soon as possible: allergic reactions like skin rash, itching or hives, swelling of the face, lips, or tongue black, tarry stools bloody or watery diarrhea breathing problems change in emotions or moods change in sex drive changes in vision chest pain or chest tightness chills confusion cough facial flushing fever headache signs and symptoms of high blood sugar such as dizziness; dry mouth; dry skin; fruity breath; nausea; stomach pain; increased hunger or thirst; increased urination signs and symptoms of liver injury like dark yellow or brown urine; general ill feeling or flu-like symptoms; light-colored stools; loss of appetite; nausea; right upper belly pain; unusually weak or tired; yellowing of the eyes or skin stomach pain trouble passing urine or change in the amount of urine weight gain or weight loss Side effects that usually do not require medical attention (report these toyour doctor or health care professional if they continue or are bothersome): bone pain constipation loss of appetite muscle pain nausea swelling of the ankles, feet, hands tiredness This list may not describe all possible side effects. Call your doctor for medical advice about side effects. You may report  side effects to FDA at1-800-FDA-1088. Where should I keep my medication? This drug is given in a hospital or clinic and will not be stored at home. NOTE: This sheet is a summary. It may not cover all possible information. If you have questions about this medicine, talk to your doctor, pharmacist, orhealth care provider.  2022 Elsevier/Gold Standard (2019-04-01 13:01:29)  Gemcitabine injection What is this medication? GEMCITABINE (jem SYE ta been)  is a chemotherapy drug. This medicine is used to treat many types of cancer like breast cancer, lung cancer, pancreatic cancer,and ovarian cancer. This medicine may be used for other purposes; ask your health care provider orpharmacist if you have questions. COMMON BRAND NAME(S): Gemzar, Infugem What should I tell my care team before I take this medication? They need to know if you have any of these conditions: blood disorders infection kidney disease liver disease lung or breathing disease, like asthma recent or ongoing radiation therapy an unusual or allergic reaction to gemcitabine, other chemotherapy, other medicines, foods, dyes, or preservatives pregnant or trying to get pregnant breast-feeding How should I use this medication? This drug is given as an infusion into a vein. It is administered in a hospitalor clinic by a specially trained health care professional. Talk to your pediatrician regarding the use of this medicine in children.Special care may be needed. Overdosage: If you think you have taken too much of this medicine contact apoison control center or emergency room at once. NOTE: This medicine is only for you. Do not share this medicine with others. What if I miss a dose? It is important not to miss your dose. Call your doctor or health careprofessional if you are unable to keep an appointment. What may interact with this medication? medicines to increase blood counts like filgrastim, pegfilgrastim, sargramostim some other chemotherapy drugs like cisplatin vaccines Talk to your doctor or health care professional before taking any of thesemedicines: acetaminophen aspirin ibuprofen ketoprofen naproxen This list may not describe all possible interactions. Give your health care provider a list of all the medicines, herbs, non-prescription drugs, or dietary supplements you use. Also tell them if you smoke, drink alcohol, or use illegaldrugs. Some items may interact with your  medicine. What should I watch for while using this medication? Visit your doctor for checks on your progress. This drug may make you feel generally unwell. This is not uncommon, as chemotherapy can affect healthy cells as well as cancer cells. Report any side effects. Continue your course oftreatment even though you feel ill unless your doctor tells you to stop. In some cases, you may be given additional medicines to help with side effects.Follow all directions for their use. Call your doctor or health care professional for advice if you get a fever, chills or sore throat, or other symptoms of a cold or flu. Do not treat yourself. This drug decreases your body's ability to fight infections. Try toavoid being around people who are sick. This medicine may increase your risk to bruise or bleed. Call your doctor orhealth care professional if you notice any unusual bleeding. Be careful brushing and flossing your teeth or using a toothpick because you may get an infection or bleed more easily. If you have any dental work done,tell your dentist you are receiving this medicine. Avoid taking products that contain aspirin, acetaminophen, ibuprofen, naproxen, or ketoprofen unless instructed by your doctor. These medicines may hide afever. Do not become pregnant while taking this medicine or for 6 months after stopping it. Women should inform  their doctor if they wish to become pregnant or think they might be pregnant. Men should not father a child while taking this medicine and for 3 months after stopping it. There is a potential for serious side effects to an unborn child. Talk to your health care professional or pharmacist for more information. Do not breast-feed an infant while takingthis medicine or for at least 1 week after stopping it. Men should inform their doctors if they wish to father a child. This medicine may lower sperm counts. Talk with your doctor or health care professional ifyou are concerned about  your fertility. What side effects may I notice from receiving this medication? Side effects that you should report to your doctor or health care professionalas soon as possible: allergic reactions like skin rash, itching or hives, swelling of the face, lips, or tongue breathing problems pain, redness, or irritation at site where injected signs and symptoms of a dangerous change in heartbeat or heart rhythm like chest pain; dizziness; fast or irregular heartbeat; palpitations; feeling faint or lightheaded, falls; breathing problems signs of decreased platelets or bleeding - bruising, pinpoint red spots on the skin, black, tarry stools, blood in the urine signs of decreased red blood cells - unusually weak or tired, feeling faint or lightheaded, falls signs of infection - fever or chills, cough, sore throat, pain or difficulty passing urine signs and symptoms of kidney injury like trouble passing urine or change in the amount of urine signs and symptoms of liver injury like dark yellow or brown urine; general ill feeling or flu-like symptoms; light-colored stools; loss of appetite; nausea; right upper belly pain; unusually weak or tired; yellowing of the eyes or skin swelling of ankles, feet, hands Side effects that usually do not require medical attention (report to yourdoctor or health care professional if they continue or are bothersome): constipation diarrhea hair loss loss of appetite nausea rash vomiting This list may not describe all possible side effects. Call your doctor for medical advice about side effects. You may report side effects to FDA at1-800-FDA-1088. Where should I keep my medication? This drug is given in a hospital or clinic and will not be stored at home. NOTE: This sheet is a summary. It may not cover all possible information. If you have questions about this medicine, talk to your doctor, pharmacist, orhealth care provider.  2022 Elsevier/Gold Standard (2017-04-16  18:06:11)  Cisplatin injection What is this medication? CISPLATIN (SIS pla tin) is a chemotherapy drug. It targets fast dividing cells, like cancer cells, and causes these cells to die. This medicine is used totreat many types of cancer like bladder, ovarian, and testicular cancers. This medicine may be used for other purposes; ask your health care provider orpharmacist if you have questions. COMMON BRAND NAME(S): Platinol, Platinol -AQ What should I tell my care team before I take this medication? They need to know if you have any of these conditions: eye disease, vision problems hearing problems kidney disease low blood counts, like white cells, platelets, or red blood cells tingling of the fingers or toes, or other nerve disorder an unusual or allergic reaction to cisplatin, carboplatin, oxaliplatin, other medicines, foods, dyes, or preservatives pregnant or trying to get pregnant breast-feeding How should I use this medication? This drug is given as an infusion into a vein. It is administered in a hospitalor clinic by a specially trained health care professional. Talk to your pediatrician regarding the use of this medicine in children.Special care may be needed. Overdosage: If  you think you have taken too much of this medicine contact apoison control center or emergency room at once. NOTE: This medicine is only for you. Do not share this medicine with others. What if I miss a dose? It is important not to miss a dose. Call your doctor or health careprofessional if you are unable to keep an appointment. What may interact with this medication? This medicine may interact with the following medications: foscarnet certain antibiotics like amikacin, gentamicin, neomycin, polymyxin B, streptomycin, tobramycin, vancomycin This list may not describe all possible interactions. Give your health care provider a list of all the medicines, herbs, non-prescription drugs, or dietary supplements you  use. Also tell them if you smoke, drink alcohol, or use illegaldrugs. Some items may interact with your medicine. What should I watch for while using this medication? Your condition will be monitored carefully while you are receiving this medicine. You will need important blood work done while you are taking thismedicine. This drug may make you feel generally unwell. This is not uncommon, as chemotherapy can affect healthy cells as well as cancer cells. Report any side effects. Continue your course of treatment even though you feel ill unless yourdoctor tells you to stop. This medicine may increase your risk of getting an infection. Call your healthcare professional for advice if you get a fever, chills, or sore throat, or other symptoms of a cold or flu. Do not treat yourself. Try to avoid beingaround people who are sick. Avoid taking medicines that contain aspirin, acetaminophen, ibuprofen, naproxen, or ketoprofen unless instructed by your healthcare professional.These medicines may hide a fever. This medicine may increase your risk to bruise or bleed. Call your doctor orhealth care professional if you notice any unusual bleeding. Be careful brushing and flossing your teeth or using a toothpick because you may get an infection or bleed more easily. If you have any dental work done,tell your dentist you are receiving this medicine. Do not become pregnant while taking this medicine or for 14 months after stopping it. Women should inform their healthcare professional if they wish to become pregnant or think they might be pregnant. Men should not father a child while taking this medicine and for 11 months after stopping it. There is potential for serious side effects to an unborn child. Talk to your healthcareprofessional for more information. Do not breast-feed an infant while taking this medicine. This medicine has caused ovarian failure in some women. This medicine may make it more difficult to get  pregnant. Talk to your healthcare professional if [place] concerned about your fertility. This medicine has caused decreased sperm counts in some men. This may make it more difficult to father a child. Talk to your healthcare professional if [place] concerned about your fertility. Drink fluids as directed while you are taking this medicine. This will helpprotect your kidneys. Call your doctor or health care professional if you get diarrhea. Do not treatyourself. What side effects may I notice from receiving this medication? Side effects that you should report to your doctor or health care professionalas soon as possible: allergic reactions like skin rash, itching or hives, swelling of the face, lips, or tongue blurred vision changes in vision decreased hearing or ringing of the ears nausea, vomiting pain, redness, or irritation at site where injected pain, tingling, numbness in the hands or feet signs and symptoms of bleeding such as bloody or black, tarry stools; red or dark brown urine; spitting up blood or brown material that looks like coffee grounds;  red spots on the skin; unusual bruising or bleeding from the eyes, gums, or nose signs and symptoms of infection like fever; chills; cough; sore throat; pain or trouble passing urine signs and symptoms of kidney injury like trouble passing urine or change in the amount of urine signs and symptoms of low red blood cells or anemia such as unusually weak or tired; feeling faint or lightheaded; falls; breathing problems Side effects that usually do not require medical attention (report to yourdoctor or health care professional if they continue or are bothersome): loss of appetite mouth sores muscle cramps This list may not describe all possible side effects. Call your doctor for medical advice about side effects. You may report side effects to FDA at1-800-FDA-1088. Where should I keep my medication? This drug is given in a hospital or clinic and  will not be stored at home. NOTE: This sheet is a summary. It may not cover all possible information. If you have questions about this medicine, talk to your doctor, pharmacist, orhealth care provider.  2022 Elsevier/Gold Standard (2018-01-16 15:59:17)

## 2020-09-11 NOTE — Progress Notes (Signed)
[facility] OFFICE PROGRESS NOTE   Diagnosis: Cholangiocarcinoma  INTERVAL HISTORY:   Matthew Osborne returns as scheduled.  He completed another cycle of gemcitabine/cisplatin/durvalumab on 08/28/2020.  He denies nausea/vomiting.  No diarrhea.  No rash.  Stable intermittent neuropathy symptoms involving the feet.  He coughed a few times yesterday.  No shortness of breath.  No fever.  Over the weekend he had several episodes of pain at the "rib cage".  The pain resolved over about 30 to 45 minutes.  Objective:  Vital signs in last 24 hours:  Blood pressure (!) 144/82, pulse 64, temperature 98.1 F (36.7 C), temperature source Oral, resp. rate 20, height 6' 2"  (1.88 m), weight 239 lb (108.4 kg), SpO2 98 %.    HEENT: Mild white coating over tongue.  No ulcers. Resp: Lungs clear bilaterally. Cardio: Regular rate and rhythm. GI: Abdomen is soft.  No hepatosplenomegaly.  Tenderness intermittently with deep palpation left upper abdomen. Vascular: No leg edema. Musculoskeletal: Nontender left anterior lower ribs. Neuro: Alert and oriented. Skin: No rash. Port-A-Cath without erythema   Lab Results:  Lab Results  Component Value Date   WBC 5.9 09/11/2020   HGB 10.7 (L) 09/11/2020   HCT 32.0 (L) 09/11/2020   MCV 102.2 (H) 09/11/2020   PLT 180 09/11/2020   NEUTROABS 3.5 09/11/2020    Imaging:  No results found.  Medications: I have reviewed the patient's current medications.  Assessment/Plan: Cholangiocarcinoma  multiple liver masses and abdominal lymphadenopathy CTs 01/13/2020-rounded hypodense mass appears to arise from the pancreas neck, multiple rim-enhancing masses in the liver, primarily left liver with segmental dilation of the left lobe Intermatic bile ducts, ill-defined hypodensity the central liver with effacement of the left portal vein, enlarged portacaval and retroperitoneal lymph nodes Ultrasound-guided biopsy of the left liver lesion  01/19/2020-adenocarcinoma, cytokeratin 7+, MSS, tumor mutation burden 1, IDH1 R132C MRI abdomen 01/31/2020-poorly marginated central liver mass, multiple smaller similar satellite liver masses, extrinsic mass-effect at the biliary hilum with intrahepatic biliary ductal dilatation throughout the left liver with mild Intermatic dilatation the superior right liver, no pancreas mass or ductal dilatation, normal spleen size, numerous enlarged enhancing lymph nodes at the porta hepatis, peripancreatic, portacaval, aortocaval, left periaortic chains Cycle 1 gemcitabine/cisplatin 02/04/2020 Cycle 2 gemcitabine/cisplatin 02/28/2020 Cycle 3 gemcitabine/cisplatin 03/20/2020 CT abdomen/pelvis 03/31/2020-mild decrease in central liver tumor, mild decrease in size of liver metastases and upper abdominal adenopathy, new splenomegaly Cycle 4 gemcitabine/cisplatin 04/10/2020 Cycle 5 gemcitabine/cisplatin 05/01/2020 Cycle 6 gemcitabine/cisplatin plus durvalumab 05/22/2020 CTs 06/06/2020- dominant central liver mass mildly enlarged, other liver lesions and abdominal adenopathy is stable, no evidence of metastatic disease to the chest Cycle 7 gemcitabine/cisplatin plus Durvalumab 06/12/2020 Cycle 8 gemcitabine/cisplatin plus Durvalumab 06/30/2020 Cycle 9 gemcitabine/cisplatin plus Durvalumab 07/31/2020 CT abdomen/pelvis 08/20/2020-decreased size of dominant central liver mass, slight increase in size and stable additional liver lesions, stable portacaval node Cycle 10 gemcitabine/cisplatin plus Durvalumab 08/21/2020 Cycle 11 gemcitabine/cisplatin plus Durvalumab 09/11/2020   Cough-likely related to diaphragmatic irritation from #1 Anorexia/weight loss Hypercalcemia-likely hypercalcemia malignancy, status post intravenous hydration and Zometa 01/14/2020 Diabetes Hyperlipidemia Hypertension History of peripheral neuropathy secondary to diabetes Severe back pain following Fulphila-oxycodone prescribed Thrombocytopenia secondary to  chemotherapy-gemcitabine held with day 1 cycle 8 and then dose reduced COVID-19 infection 07/22/2020    Disposition: Matthew Osborne appears stable.  He has completed 10 cycles of systemic therapy.  Plan to proceed with cycle 11 today as scheduled.  He will return for day 8 gemcitabine/cisplatin on 09/18/2020.  We reviewed the CBC  from today.  Counts adequate to proceed with treatment.  He will return for follow-up in the next cycle of chemotherapy in 3 weeks.  We are available to see him sooner if needed.      Ned Card ANP/GNP-BC   09/11/2020  8:26 AM

## 2020-09-13 ENCOUNTER — Telehealth: Payer: Self-pay

## 2020-09-13 NOTE — Telephone Encounter (Signed)
TC from Everlean Cherry Nurse Case Manager [phone] with [facility] Bowels stating she received and e mail from the Pt's insurance stating they have no authorization for services for Pt from 7/5-7/26 she also left a  Fax number [phone] message sent to Lendell Caprice who sent it to Otilio Carpen to return call.

## 2020-09-18 ENCOUNTER — Other Ambulatory Visit: Payer: Self-pay

## 2020-09-18 ENCOUNTER — Inpatient Hospital Stay: Payer: No Typology Code available for payment source

## 2020-09-18 ENCOUNTER — Other Ambulatory Visit (HOSPITAL_COMMUNITY): Payer: Self-pay

## 2020-09-18 VITALS — BP 151/93 | HR 58 | Temp 98.3°F | Resp 20

## 2020-09-18 DIAGNOSIS — C221 Intrahepatic bile duct carcinoma: Secondary | ICD-10-CM

## 2020-09-18 DIAGNOSIS — Z5112 Encounter for antineoplastic immunotherapy: Secondary | ICD-10-CM | POA: Diagnosis not present

## 2020-09-18 LAB — CBC WITH DIFFERENTIAL/PLATELET
Abs Immature Granulocytes: 0 10*3/uL (ref 0.00–0.07)
Basophils Absolute: 0.1 10*3/uL (ref 0.0–0.1)
Basophils Relative: 3 %
Eosinophils Absolute: 0.1 10*3/uL (ref 0.0–0.5)
Eosinophils Relative: 5 %
HCT: 29.5 % — ABNORMAL LOW (ref 39.0–52.0)
Hemoglobin: 9.9 g/dL — ABNORMAL LOW (ref 13.0–17.0)
Immature Granulocytes: 0 %
Lymphocytes Relative: 38 %
Lymphs Abs: 1 10*3/uL (ref 0.7–4.0)
MCH: 33.7 pg (ref 26.0–34.0)
MCHC: 33.6 g/dL (ref 30.0–36.0)
MCV: 100.3 fL — ABNORMAL HIGH (ref 80.0–100.0)
Monocytes Absolute: 0.4 10*3/uL (ref 0.1–1.0)
Monocytes Relative: 16 %
Neutro Abs: 1 10*3/uL — ABNORMAL LOW (ref 1.7–7.7)
Neutrophils Relative %: 38 %
Platelets: 174 10*3/uL (ref 150–400)
RBC: 2.94 MIL/uL — ABNORMAL LOW (ref 4.22–5.81)
RDW: 15.7 % — ABNORMAL HIGH (ref 11.5–15.5)
WBC: 2.6 10*3/uL — ABNORMAL LOW (ref 4.0–10.5)
nRBC: 0 % (ref 0.0–0.2)

## 2020-09-18 LAB — CMP (CANCER CENTER ONLY)
ALT: 29 U/L (ref 0–44)
AST: 30 U/L (ref 15–41)
Albumin: 3.8 g/dL (ref 3.5–5.0)
Alkaline Phosphatase: 114 U/L (ref 38–126)
Anion gap: 6 (ref 5–15)
BUN: 19 mg/dL (ref 6–20)
CO2: 28 mmol/L (ref 22–32)
Calcium: 9.3 mg/dL (ref 8.9–10.3)
Chloride: 101 mmol/L (ref 98–111)
Creatinine: 0.78 mg/dL (ref 0.61–1.24)
GFR, Estimated: >60 mL/min (ref >60)
Glucose, Bld: 232 mg/dL — ABNORMAL HIGH (ref 70–99)
Potassium: 4.4 mmol/L (ref 3.5–5.1)
Sodium: 135 mmol/L (ref 135–145)
Total Bilirubin: 0.4 mg/dL (ref 0.3–1.2)
Total Protein: 7.2 g/dL (ref 6.5–8.1)

## 2020-09-18 MED ORDER — SODIUM CHLORIDE 0.9 % IV SOLN: Freq: Once | INTRAVENOUS | Status: AC

## 2020-09-18 MED ORDER — SODIUM CHLORIDE 0.9 % IV SOLN
25.0000 mg/m2 | Freq: Once | INTRAVENOUS | Status: AC
  Administered 2020-09-18: 59 mg via INTRAVENOUS
  Filled 2020-09-18: qty 59

## 2020-09-18 MED ORDER — DEXAMETHASONE SODIUM PHOSPHATE 10 MG/ML IJ SOLN
5.0000 mg | Freq: Once | INTRAMUSCULAR | Status: AC
  Administered 2020-09-18: 5 mg via INTRAVENOUS
  Filled 2020-09-18: qty 1

## 2020-09-18 MED ORDER — FAMOTIDINE 20 MG IN NS 100 ML IVPB
20.0000 mg | Freq: Once | INTRAVENOUS | Status: AC
  Administered 2020-09-18: 20 mg via INTRAVENOUS
  Filled 2020-09-18: qty 100

## 2020-09-18 MED ORDER — MAGNESIUM SULFATE 2 GM/50ML IV SOLN
2.0000 g | Freq: Once | INTRAVENOUS | Status: AC
  Administered 2020-09-18: 2 g via INTRAVENOUS
  Filled 2020-09-18: qty 50

## 2020-09-18 MED ORDER — SODIUM CHLORIDE 0.9 % IV SOLN: Freq: Once | INTRAVENOUS | Status: DC

## 2020-09-18 MED ORDER — PALONOSETRON HCL INJECTION 0.25 MG/5ML
0.2500 mg | Freq: Once | INTRAVENOUS | Status: AC
  Administered 2020-09-18: 0.25 mg via INTRAVENOUS
  Filled 2020-09-18: qty 5

## 2020-09-18 MED ORDER — SODIUM CHLORIDE 0.9 % IV SOLN
800.0000 mg/m2 | Freq: Once | INTRAVENOUS | Status: AC
  Administered 2020-09-18: 1862 mg via INTRAVENOUS
  Filled 2020-09-18: qty 48.97

## 2020-09-18 MED ORDER — SODIUM CHLORIDE 0.9 % IV SOLN
150.0000 mg | Freq: Once | INTRAVENOUS | Status: AC
  Administered 2020-09-18: 150 mg via INTRAVENOUS
  Filled 2020-09-18: qty 5

## 2020-09-18 MED ORDER — POTASSIUM CHLORIDE IN NACL 20-0.9 MEQ/L-% IV SOLN
Freq: Once | INTRAVENOUS | Status: AC
  Filled 2020-09-18: qty 1000

## 2020-09-18 MED ORDER — SODIUM CHLORIDE 0.9% FLUSH
10.0000 mL | INTRAVENOUS | Status: DC | PRN
  Administered 2020-09-18: 10 mL

## 2020-09-18 MED ORDER — HEPARIN SOD (PORK) LOCK FLUSH 100 UNIT/ML IV SOLN
500.0000 [IU] | Freq: Once | INTRAVENOUS | Status: AC | PRN
  Administered 2020-09-18: 500 [IU]

## 2020-09-18 NOTE — Patient Instructions (Signed)
Implanted Port Home Guide An implanted port is a device that is placed under the skin. It is usually placed in the chest. The device can be used to give IV medicine, to take blood, or for dialysis. You may have an implanted port if: You need IV medicine that would be irritating to the small veins in your hands or arms. You need IV medicines, such as antibiotics, for a long period of time. You need IV nutrition for a long period of time. You need dialysis. When you have a port, your health care provider can choose to use the port instead of veins in your arms for these procedures. You may have fewer limitations when using a port than you would if you used other types of long-term IVs, and you will likely be able to return to normal activities afteryour incision heals. An implanted port has two main parts: Reservoir. The reservoir is the part where a needle is inserted to give medicines or draw blood. The reservoir is round. After it is placed, it appears as a small, raised area under your skin. Catheter. The catheter is a thin, flexible tube that connects the reservoir to a vein. Medicine that is inserted into the reservoir goes into the catheter and then into the vein. How is my port accessed? To access your port: A numbing cream may be placed on the skin over the port site. Your health care provider will put on a mask and sterile gloves. The skin over your port will be cleaned carefully with a germ-killing soap and allowed to dry. Your health care provider will gently pinch the port and insert a needle into it. Your health care provider will check for a blood return to make sure the port is in the vein and is not clogged. If your port needs to remain accessed to get medicine continuously (constant infusion), your health care provider will place a clear bandage (dressing) over the needle site. The dressing and needle will need to be changed every week, or as told by your health care provider. What  is flushing? Flushing helps keep the port from getting clogged. Follow instructions from your health care provider about how and when to flush the port. Ports are usually flushed with saline solution or a medicine called heparin. The need for flushing will depend on how the port is used: If the port is only used from time to time to give medicines or draw blood, the port may need to be flushed: Before and after medicines have been given. Before and after blood has been drawn. As part of routine maintenance. Flushing may be recommended every 4-6 weeks. If a constant infusion is running, the port may not need to be flushed. Throw away any syringes in a disposal container that is meant for sharp items (sharps container). You can buy a sharps container from a pharmacy, or you can make one by using an empty hard plastic bottle with a cover. How long will my port stay implanted? The port can stay in for as long as your health care provider thinks it is needed. When it is time for the port to come out, a surgery will be done to remove it. The surgery will be similar to the procedure that was done to putthe port in. Follow these instructions at home:  Flush your port as told by your health care provider. If you need an infusion over several days, follow instructions from your health care provider about how to take   care of your port site. Make sure you: Wash your hands with soap and water before you change your dressing. If soap and water are not available, use alcohol-based hand sanitizer. Change your dressing as told by your health care provider. Place any used dressings or infusion bags into a plastic bag. Throw that bag in the trash. Keep the dressing that covers the needle clean and dry. Do not get it wet. Do not use scissors or sharp objects near the tube. Keep the tube clamped, unless it is being used. Check your port site every day for signs of infection. Check for: Redness, swelling, or  pain. Fluid or blood. Pus or a bad smell. Protect the skin around the port site. Avoid wearing bra straps that rub or irritate the site. Protect the skin around your port from seat belts. Place a soft pad over your chest if needed. Bathe or shower as told by your health care provider. The site may get wet as long as you are not actively receiving an infusion. Return to your normal activities as told by your health care provider. Ask your health care provider what activities are safe for you. Carry a medical alert card or wear a medical alert bracelet at all times. This will let health care providers know that you have an implanted port in case of an emergency. Get help right away if: You have redness, swelling, or pain at the port site. You have fluid or blood coming from your port site. You have pus or a bad smell coming from the port site. You have a fever. Summary Implanted ports are usually placed in the chest for long-term IV access. Follow instructions from your health care provider about flushing the port and changing bandages (dressings). Take care of the area around your port by avoiding clothing that puts pressure on the area, and by watching for signs of infection. Protect the skin around your port from seat belts. Place a soft pad over your chest if needed. Get help right away if you have a fever or you have redness, swelling, pain, drainage, or a bad smell at the port site. This information is not intended to replace advice given to you by your health care provider. Make sure you discuss any questions you have with your healthcare provider. Document Revised: 06/07/2019 Document Reviewed: 06/07/2019 Elsevier Patient Education  2022 Elsevier Inc.  

## 2020-09-18 NOTE — Progress Notes (Signed)
Per Ned Card, NP ok to treat with ANC 1.0.

## 2020-09-18 NOTE — Patient Instructions (Signed)
[facility]   Discharge Instructions: Thank you for choosing [facility] to provide your oncology and hematology care.   If you have a lab appointment with the [facility], please go directly to the [facility] and check in at the registration area.   Wear comfortable clothing and clothing appropriate for easy access to any Portacath or PICC line.   We strive to give you quality time with your provider. You may need to reschedule your appointment if you arrive late (15 or more minutes).  Arriving late affects you and other patients whose appointments are after yours.  Also, if you miss three or more appointments without notifying the office, you may be dismissed from the clinic at the provider's discretion.      For prescription refill requests, have your pharmacy contact our office and allow 72 hours for refills to be completed.    Today you received the following chemotherapy and/or immunotherapy agents  Gemcitabine (GEMZAR) & Cisplatin (PLATINOL).      To help prevent nausea and vomiting after your treatment, we encourage you to take your nausea medication as directed.  BELOW ARE SYMPTOMS THAT SHOULD BE REPORTED IMMEDIATELY: *FEVER GREATER THAN 100.4 F (38 C) OR HIGHER *CHILLS OR SWEATING *NAUSEA AND VOMITING THAT IS NOT CONTROLLED WITH YOUR NAUSEA MEDICATION *UNUSUAL SHORTNESS OF BREATH *UNUSUAL BRUISING OR BLEEDING *URINARY PROBLEMS (pain or burning when urinating, or frequent urination) *BOWEL PROBLEMS (unusual diarrhea, constipation, pain near the anus) TENDERNESS IN MOUTH AND THROAT WITH OR WITHOUT PRESENCE OF ULCERS (sore throat, sores in mouth, or a toothache) UNUSUAL RASH, SWELLING OR PAIN  UNUSUAL VAGINAL DISCHARGE OR ITCHING   Items with * indicate a potential emergency and should be followed up as soon as possible or go to the Emergency Department if any problems should occur.  Please show the CHEMOTHERAPY ALERT CARD or  IMMUNOTHERAPY ALERT CARD at check-in to the Emergency Department and triage nurse.  Should you have questions after your visit or need to cancel or reschedule your appointment, please contact [facility]  Dept: [phone]  and follow the prompts.  Office hours are 8:00 a.m. to 4:30 p.m. Monday - Friday. Please note that voicemails left after 4:00 p.m. may not be returned until the following business day.  We are closed weekends and major holidays. You have access to a nurse at all times for urgent questions. Please call the main number to the clinic Dept: [phone] and follow the prompts.   For any non-urgent questions, you may also contact your provider using MyChart. We now offer e-Visits for anyone 35 and older to request care online for non-urgent symptoms. For details visit [email or web address].   Also download the MyChart app! Go to the app store, search "MyChart", open the app, select [facility], and log in with your MyChart username and password.  Due to Covid, a mask is required upon entering the hospital/clinic. If you do not have a mask, one will be given to you upon arrival. For doctor visits, patients may have 1 support person aged 20 or older with them. For treatment visits, patients cannot have anyone with them due to current Covid guidelines and our immunocompromised population.   Gemcitabine injection What is this medication? GEMCITABINE (jem SYE ta been) is a chemotherapy drug. This medicine is used to treat many types of cancer like breast cancer, lung cancer, pancreatic cancer,and ovarian cancer. This medicine may be used for other purposes; ask  your health care provider orpharmacist if you have questions. COMMON BRAND NAME(S): Gemzar, Infugem What should I tell my care team before I take this medication? They need to know if you have any of these conditions: blood disorders infection kidney disease liver disease lung or breathing disease,  like asthma recent or ongoing radiation therapy an unusual or allergic reaction to gemcitabine, other chemotherapy, other medicines, foods, dyes, or preservatives pregnant or trying to get pregnant breast-feeding How should I use this medication? This drug is given as an infusion into a vein. It is administered in a hospitalor clinic by a specially trained health care professional. Talk to your pediatrician regarding the use of this medicine in children.Special care may be needed. Overdosage: If you think you have taken too much of this medicine contact apoison control center or emergency room at once. NOTE: This medicine is only for you. Do not share this medicine with others. What if I miss a dose? It is important not to miss your dose. Call your doctor or health careprofessional if you are unable to keep an appointment. What may interact with this medication? medicines to increase blood counts like filgrastim, pegfilgrastim, sargramostim some other chemotherapy drugs like cisplatin vaccines Talk to your doctor or health care professional before taking any of thesemedicines: acetaminophen aspirin ibuprofen ketoprofen naproxen This list may not describe all possible interactions. Give your health care provider a list of all the medicines, herbs, non-prescription drugs, or dietary supplements you use. Also tell them if you smoke, drink alcohol, or use illegaldrugs. Some items may interact with your medicine. What should I watch for while using this medication? Visit your doctor for checks on your progress. This drug may make you feel generally unwell. This is not uncommon, as chemotherapy can affect healthy cells as well as cancer cells. Report any side effects. Continue your course oftreatment even though you feel ill unless your doctor tells you to stop. In some cases, you may be given additional medicines to help with side effects.Follow all directions for their use. Call your doctor or  health care professional for advice if you get a fever, chills or sore throat, or other symptoms of a cold or flu. Do not treat yourself. This drug decreases your body's ability to fight infections. Try toavoid being around people who are sick. This medicine may increase your risk to bruise or bleed. Call your doctor orhealth care professional if you notice any unusual bleeding. Be careful brushing and flossing your teeth or using a toothpick because you may get an infection or bleed more easily. If you have any dental work done,tell your dentist you are receiving this medicine. Avoid taking products that contain aspirin, acetaminophen, ibuprofen, naproxen, or ketoprofen unless instructed by your doctor. These medicines may hide afever. Do not become pregnant while taking this medicine or for 6 months after stopping it. Women should inform their doctor if they wish to become pregnant or think they might be pregnant. Men should not father a child while taking this medicine and for 3 months after stopping it. There is a potential for serious side effects to an unborn child. Talk to your health care professional or pharmacist for more information. Do not breast-feed an infant while takingthis medicine or for at least 1 week after stopping it. Men should inform their doctors if they wish to father a child. This medicine may lower sperm counts. Talk with your doctor or health care professional ifyou are concerned about your fertility. What side  effects may I notice from receiving this medication? Side effects that you should report to your doctor or health care professionalas soon as possible: allergic reactions like skin rash, itching or hives, swelling of the face, lips, or tongue breathing problems pain, redness, or irritation at site where injected signs and symptoms of a dangerous change in heartbeat or heart rhythm like chest pain; dizziness; fast or irregular heartbeat; palpitations; feeling faint or  lightheaded, falls; breathing problems signs of decreased platelets or bleeding - bruising, pinpoint red spots on the skin, black, tarry stools, blood in the urine signs of decreased red blood cells - unusually weak or tired, feeling faint or lightheaded, falls signs of infection - fever or chills, cough, sore throat, pain or difficulty passing urine signs and symptoms of kidney injury like trouble passing urine or change in the amount of urine signs and symptoms of liver injury like dark yellow or brown urine; general ill feeling or flu-like symptoms; light-colored stools; loss of appetite; nausea; right upper belly pain; unusually weak or tired; yellowing of the eyes or skin swelling of ankles, feet, hands Side effects that usually do not require medical attention (report to yourdoctor or health care professional if they continue or are bothersome): constipation diarrhea hair loss loss of appetite nausea rash vomiting This list may not describe all possible side effects. Call your doctor for medical advice about side effects. You may report side effects to FDA at1-800-FDA-1088. Where should I keep my medication? This drug is given in a hospital or clinic and will not be stored at home. NOTE: This sheet is a summary. It may not cover all possible information. If you have questions about this medicine, talk to your doctor, pharmacist, orhealth care provider.  2022 Elsevier/Gold Standard (2017-04-16 18:06:11)  Cisplatin injection What is this medication? CISPLATIN (SIS pla tin) is a chemotherapy drug. It targets fast dividing cells, like cancer cells, and causes these cells to die. This medicine is used totreat many types of cancer like bladder, ovarian, and testicular cancers. This medicine may be used for other purposes; ask your health care provider orpharmacist if you have questions. COMMON BRAND NAME(S): Platinol, Platinol -AQ What should I tell my care team before I take this  medication? They need to know if you have any of these conditions: eye disease, vision problems hearing problems kidney disease low blood counts, like white cells, platelets, or red blood cells tingling of the fingers or toes, or other nerve disorder an unusual or allergic reaction to cisplatin, carboplatin, oxaliplatin, other medicines, foods, dyes, or preservatives pregnant or trying to get pregnant breast-feeding How should I use this medication? This drug is given as an infusion into a vein. It is administered in a hospitalor clinic by a specially trained health care professional. Talk to your pediatrician regarding the use of this medicine in children.Special care may be needed. Overdosage: If you think you have taken too much of this medicine contact apoison control center or emergency room at once. NOTE: This medicine is only for you. Do not share this medicine with others. What if I miss a dose? It is important not to miss a dose. Call your doctor or health careprofessional if you are unable to keep an appointment. What may interact with this medication? This medicine may interact with the following medications: foscarnet certain antibiotics like amikacin, gentamicin, neomycin, polymyxin B, streptomycin, tobramycin, vancomycin This list may not describe all possible interactions. Give your health care provider a list of all the  medicines, herbs, non-prescription drugs, or dietary supplements you use. Also tell them if you smoke, drink alcohol, or use illegaldrugs. Some items may interact with your medicine. What should I watch for while using this medication? Your condition will be monitored carefully while you are receiving this medicine. You will need important blood work done while you are taking thismedicine. This drug may make you feel generally unwell. This is not uncommon, as chemotherapy can affect healthy cells as well as cancer cells. Report any side effects. Continue your  course of treatment even though you feel ill unless yourdoctor tells you to stop. This medicine may increase your risk of getting an infection. Call your healthcare professional for advice if you get a fever, chills, or sore throat, or other symptoms of a cold or flu. Do not treat yourself. Try to avoid beingaround people who are sick. Avoid taking medicines that contain aspirin, acetaminophen, ibuprofen, naproxen, or ketoprofen unless instructed by your healthcare professional.These medicines may hide a fever. This medicine may increase your risk to bruise or bleed. Call your doctor orhealth care professional if you notice any unusual bleeding. Be careful brushing and flossing your teeth or using a toothpick because you may get an infection or bleed more easily. If you have any dental work done,tell your dentist you are receiving this medicine. Do not become pregnant while taking this medicine or for 14 months after stopping it. Women should inform their healthcare professional if they wish to become pregnant or think they might be pregnant. Men should not father a child while taking this medicine and for 11 months after stopping it. There is potential for serious side effects to an unborn child. Talk to your healthcareprofessional for more information. Do not breast-feed an infant while taking this medicine. This medicine has caused ovarian failure in some women. This medicine may make it more difficult to get pregnant. Talk to your healthcare professional if [place] concerned about your fertility. This medicine has caused decreased sperm counts in some men. This may make it more difficult to father a child. Talk to your healthcare professional if [place] concerned about your fertility. Drink fluids as directed while you are taking this medicine. This will helpprotect your kidneys. Call your doctor or health care professional if you get diarrhea. Do not treatyourself. What side effects may I notice from  receiving this medication? Side effects that you should report to your doctor or health care professionalas soon as possible: allergic reactions like skin rash, itching or hives, swelling of the face, lips, or tongue blurred vision changes in vision decreased hearing or ringing of the ears nausea, vomiting pain, redness, or irritation at site where injected pain, tingling, numbness in the hands or feet signs and symptoms of bleeding such as bloody or black, tarry stools; red or dark brown urine; spitting up blood or brown material that looks like coffee grounds; red spots on the skin; unusual bruising or bleeding from the eyes, gums, or nose signs and symptoms of infection like fever; chills; cough; sore throat; pain or trouble passing urine signs and symptoms of kidney injury like trouble passing urine or change in the amount of urine signs and symptoms of low red blood cells or anemia such as unusually weak or tired; feeling faint or lightheaded; falls; breathing problems Side effects that usually do not require medical attention (report to yourdoctor or health care professional if they continue or are bothersome): loss of appetite mouth sores muscle cramps This list may not describe  all possible side effects. Call your doctor for medical advice about side effects. You may report side effects to FDA at1-800-FDA-1088. Where should I keep my medication? This drug is given in a hospital or clinic and will not be stored at home. NOTE: This sheet is a summary. It may not cover all possible information. If you have questions about this medicine, talk to your doctor, pharmacist, orhealth care provider.  2022 Elsevier/Gold Standard (2018-01-16 15:59:17)

## 2020-09-19 ENCOUNTER — Inpatient Hospital Stay: Payer: No Typology Code available for payment source

## 2020-09-19 VITALS — BP 146/84 | HR 67 | Temp 98.1°F | Resp 20

## 2020-09-19 DIAGNOSIS — Z5112 Encounter for antineoplastic immunotherapy: Secondary | ICD-10-CM | POA: Diagnosis not present

## 2020-09-19 DIAGNOSIS — C221 Intrahepatic bile duct carcinoma: Secondary | ICD-10-CM

## 2020-09-19 MED ORDER — PEGFILGRASTIM-JMDB 6 MG/0.6ML ~~LOC~~ SOSY
6.0000 mg | PREFILLED_SYRINGE | Freq: Once | SUBCUTANEOUS | Status: AC
  Administered 2020-09-19: 6 mg via SUBCUTANEOUS

## 2020-09-19 NOTE — Patient Instructions (Signed)

## 2020-09-20 ENCOUNTER — Encounter: Payer: Self-pay | Admitting: *Deleted

## 2020-09-20 NOTE — Progress Notes (Signed)
Received record request for 09/11/20 visit, labs, med list, treatment record from [employer]. Faxed requested records to [phone] att: Wilford Sports, LPN Case ID [id number]. Also received notification from [employer] that [employer] testing was denied. Spoke w/Sara w/FO and she reports this is in response to their appeal sent in. They will appeal a 2nd time and if still denied will reach out to patient w/reduced cost and payment plan.

## 2020-09-26 ENCOUNTER — Other Ambulatory Visit (HOSPITAL_BASED_OUTPATIENT_CLINIC_OR_DEPARTMENT_OTHER): Payer: Self-pay

## 2020-09-26 ENCOUNTER — Other Ambulatory Visit: Payer: Self-pay | Admitting: Family Medicine

## 2020-09-26 MED ORDER — DEXCOM G6 TRANSMITTER MISC
1 refills | Status: DC
  Filled 2020-09-26: qty 1, 90d supply, fill #0
  Filled 2021-01-18: qty 1, 90d supply, fill #1

## 2020-09-26 MED FILL — Insulin Pen Needle 31 G X 5 MM (1/5" or 3/16"): 25 days supply | Qty: 100 | Fill #3 | Status: AC

## 2020-09-26 MED FILL — Insulin Lispro Soln Pen-injector 100 Unit/ML (1 Unit Dial): SUBCUTANEOUS | 41 days supply | Qty: 15 | Fill #2 | Status: AC

## 2020-09-27 ENCOUNTER — Other Ambulatory Visit (HOSPITAL_BASED_OUTPATIENT_CLINIC_OR_DEPARTMENT_OTHER): Payer: Self-pay

## 2020-10-01 ENCOUNTER — Other Ambulatory Visit: Payer: Self-pay | Admitting: Oncology

## 2020-10-02 ENCOUNTER — Inpatient Hospital Stay (HOSPITAL_BASED_OUTPATIENT_CLINIC_OR_DEPARTMENT_OTHER): Payer: No Typology Code available for payment source | Admitting: Oncology

## 2020-10-02 ENCOUNTER — Inpatient Hospital Stay: Payer: No Typology Code available for payment source

## 2020-10-02 ENCOUNTER — Other Ambulatory Visit: Payer: Self-pay

## 2020-10-02 VITALS — BP 140/91

## 2020-10-02 VITALS — BP 157/100 | HR 70 | Temp 98.0°F | Resp 18 | Ht 74.0 in | Wt 237.0 lb

## 2020-10-02 DIAGNOSIS — C221 Intrahepatic bile duct carcinoma: Secondary | ICD-10-CM

## 2020-10-02 DIAGNOSIS — Z5112 Encounter for antineoplastic immunotherapy: Secondary | ICD-10-CM | POA: Diagnosis not present

## 2020-10-02 LAB — CBC WITH DIFFERENTIAL (CANCER CENTER ONLY)
Abs Immature Granulocytes: 0.01 10*3/uL (ref 0.00–0.07)
Basophils Absolute: 0.1 10*3/uL (ref 0.0–0.1)
Basophils Relative: 1 %
Eosinophils Absolute: 0.4 10*3/uL (ref 0.0–0.5)
Eosinophils Relative: 6 %
HCT: 32.2 % — ABNORMAL LOW (ref 39.0–52.0)
Hemoglobin: 10.6 g/dL — ABNORMAL LOW (ref 13.0–17.0)
Immature Granulocytes: 0 %
Lymphocytes Relative: 16 %
Lymphs Abs: 0.9 10*3/uL (ref 0.7–4.0)
MCH: 33.9 pg (ref 26.0–34.0)
MCHC: 32.9 g/dL (ref 30.0–36.0)
MCV: 102.9 fL — ABNORMAL HIGH (ref 80.0–100.0)
Monocytes Absolute: 0.8 10*3/uL (ref 0.1–1.0)
Monocytes Relative: 14 %
Neutro Abs: 3.8 10*3/uL (ref 1.7–7.7)
Neutrophils Relative %: 63 %
Platelet Count: 183 10*3/uL (ref 150–400)
RBC: 3.13 MIL/uL — ABNORMAL LOW (ref 4.22–5.81)
RDW: 17.4 % — ABNORMAL HIGH (ref 11.5–15.5)
WBC Count: 5.9 10*3/uL (ref 4.0–10.5)
nRBC: 0 % (ref 0.0–0.2)

## 2020-10-02 LAB — CMP (CANCER CENTER ONLY)
ALT: 18 U/L (ref 0–44)
AST: 29 U/L (ref 15–41)
Albumin: 3.7 g/dL (ref 3.5–5.0)
Alkaline Phosphatase: 132 U/L — ABNORMAL HIGH (ref 38–126)
Anion gap: 6 (ref 5–15)
BUN: 14 mg/dL (ref 6–20)
CO2: 28 mmol/L (ref 22–32)
Calcium: 9.1 mg/dL (ref 8.9–10.3)
Chloride: 102 mmol/L (ref 98–111)
Creatinine: 0.88 mg/dL (ref 0.61–1.24)
GFR, Estimated: >60 mL/min (ref >60)
Glucose, Bld: 200 mg/dL — ABNORMAL HIGH (ref 70–99)
Potassium: 4 mmol/L (ref 3.5–5.1)
Sodium: 136 mmol/L (ref 135–145)
Total Bilirubin: 0.5 mg/dL (ref 0.3–1.2)
Total Protein: 7.1 g/dL (ref 6.5–8.1)

## 2020-10-02 LAB — MAGNESIUM: Magnesium: 2 mg/dL (ref 1.7–2.4)

## 2020-10-02 MED ORDER — SODIUM CHLORIDE 0.9 % IV SOLN: Freq: Once | INTRAVENOUS | Status: AC

## 2020-10-02 MED ORDER — SODIUM CHLORIDE 0.9 % IV SOLN
150.0000 mg | Freq: Once | INTRAVENOUS | Status: AC
  Administered 2020-10-02: 150 mg via INTRAVENOUS
  Filled 2020-10-02: qty 5

## 2020-10-02 MED ORDER — MAGNESIUM SULFATE 2 GM/50ML IV SOLN
2.0000 g | Freq: Once | INTRAVENOUS | Status: AC
  Administered 2020-10-02: 2 g via INTRAVENOUS
  Filled 2020-10-02: qty 50

## 2020-10-02 MED ORDER — SODIUM CHLORIDE 0.9 % IV SOLN
25.0000 mg/m2 | Freq: Once | INTRAVENOUS | Status: AC
  Administered 2020-10-02: 59 mg via INTRAVENOUS
  Filled 2020-10-02: qty 59

## 2020-10-02 MED ORDER — SODIUM CHLORIDE 0.9 % IV SOLN
1500.0000 mg | Freq: Once | INTRAVENOUS | Status: AC
  Administered 2020-10-02: 1500 mg via INTRAVENOUS
  Filled 2020-10-02: qty 30

## 2020-10-02 MED ORDER — POTASSIUM CHLORIDE IN NACL 20-0.9 MEQ/L-% IV SOLN
Freq: Once | INTRAVENOUS | Status: AC
  Filled 2020-10-02: qty 1000

## 2020-10-02 MED ORDER — HEPARIN SOD (PORK) LOCK FLUSH 100 UNIT/ML IV SOLN
500.0000 [IU] | Freq: Once | INTRAVENOUS | Status: AC | PRN
  Administered 2020-10-02: 500 [IU]

## 2020-10-02 MED ORDER — SODIUM CHLORIDE 0.9% FLUSH
10.0000 mL | INTRAVENOUS | Status: DC | PRN
  Administered 2020-10-02: 10 mL

## 2020-10-02 MED ORDER — FAMOTIDINE 20 MG IN NS 100 ML IVPB
20.0000 mg | Freq: Once | INTRAVENOUS | Status: AC
  Administered 2020-10-02: 20 mg via INTRAVENOUS
  Filled 2020-10-02: qty 100

## 2020-10-02 MED ORDER — SODIUM CHLORIDE 0.9 % IV SOLN
800.0000 mg/m2 | Freq: Once | INTRAVENOUS | Status: AC
  Administered 2020-10-02: 1862 mg via INTRAVENOUS
  Filled 2020-10-02: qty 48.97

## 2020-10-02 MED ORDER — DEXAMETHASONE SODIUM PHOSPHATE 10 MG/ML IJ SOLN
5.0000 mg | Freq: Once | INTRAMUSCULAR | Status: AC
  Administered 2020-10-02: 5 mg via INTRAVENOUS
  Filled 2020-10-02: qty 1

## 2020-10-02 MED ORDER — PALONOSETRON HCL INJECTION 0.25 MG/5ML
0.2500 mg | Freq: Once | INTRAVENOUS | Status: AC
  Administered 2020-10-02: 0.25 mg via INTRAVENOUS
  Filled 2020-10-02: qty 5

## 2020-10-02 NOTE — Patient Instructions (Signed)
Matthew Osborne   Discharge Instructions: Thank you for choosing [facility] to provide your oncology and hematology care.   If you have a lab appointment with the [facility], please go directly to the [facility] and check in at the registration area.   Wear comfortable clothing and clothing appropriate for easy access to any Portacath or PICC line.   We strive to give you quality time with your provider. You may need to reschedule your appointment if you arrive late (15 or more minutes).  Arriving late affects you and other patients whose appointments are after yours.  Also, if you miss three or more appointments without notifying the office, you may be dismissed from the clinic at the provider's discretion.      For prescription refill requests, have your pharmacy contact our office and allow 72 hours for refills to be completed.    Today you received the following chemotherapy and/or immunotherapy agents Durvalumab (IMFINZI), Gemcitabine (GEMZAR) & Cisplatin (PLATINOL).      To help prevent nausea and vomiting after your treatment, we encourage you to take your nausea medication as directed.  BELOW ARE SYMPTOMS THAT SHOULD BE REPORTED IMMEDIATELY: *FEVER GREATER THAN 100.4 F (38 C) OR HIGHER *CHILLS OR SWEATING *NAUSEA AND VOMITING THAT IS NOT CONTROLLED WITH YOUR NAUSEA MEDICATION *UNUSUAL SHORTNESS OF BREATH *UNUSUAL BRUISING OR BLEEDING *URINARY PROBLEMS (pain or burning when urinating, or frequent urination) *BOWEL PROBLEMS (unusual diarrhea, constipation, pain near the anus) TENDERNESS IN MOUTH AND THROAT WITH OR WITHOUT PRESENCE OF ULCERS (sore throat, sores in mouth, or a toothache) UNUSUAL RASH, SWELLING OR PAIN  UNUSUAL VAGINAL DISCHARGE OR ITCHING   Items with * indicate a potential emergency and should be followed up as soon as possible or go to the Emergency Department if any problems should occur.  Please show the CHEMOTHERAPY  ALERT CARD or IMMUNOTHERAPY ALERT CARD at check-in to the Emergency Department and triage nurse.  Should you have questions after your visit or need to cancel or reschedule your appointment, please contact [facility]  Dept: [phone]  and follow the prompts.  Office hours are 8:00 a.m. to 4:30 p.m. Monday - Friday. Please note that voicemails left after 4:00 p.m. may not be returned until the following business day.  We are closed weekends and major holidays. You have access to a nurse at all times for urgent questions. Please call the main number to the clinic Dept: [phone] and follow the prompts.   For any non-urgent questions, you may also contact your provider using MyChart. We now offer e-Visits for anyone 56 and older to request care online for non-urgent symptoms. For details visit [email or web address].   Also download the MyChart app! Go to the app store, search "MyChart", open the app, select [facility], and log in with your MyChart username and password.  Due to Covid, a mask is required upon entering the hospital/clinic. If you do not have a mask, one will be given to you upon arrival. For doctor visits, patients may have 1 support person aged 13 or older with them. For treatment visits, patients cannot have anyone with them due to current Covid guidelines and our immunocompromised population.   Durvalumab injection What is this medication? DURVALUMAB (dur VAL ue mab) is a monoclonal antibody. It is used to treat lungcancer. This medicine may be used for other purposes; ask your health care provider orpharmacist if you have questions. COMMON BRAND NAME(S):  IMFINZI What should I tell my care team before I take this medication? They need to know if you have any of these conditions: autoimmune diseases like Crohn's disease, ulcerative colitis, or lupus have had or planning to have an allogeneic stem cell transplant (uses someone else's stem  cells) history of organ transplant history of radiation to the chest nervous system problems like myasthenia gravis or Guillain-Barre syndrome an unusual or allergic reaction to durvalumab, other medicines, foods, dyes, or preservatives pregnant or trying to get pregnant breast-feeding How should I use this medication? This medicine is for infusion into a vein. It is given by a health careprofessional in a hospital or clinic setting. A special MedGuide will be given to you before each treatment. Be sure to readthis information carefully each time. Talk to your pediatrician regarding the use of this medicine in children.Special care may be needed. Overdosage: If you think you have taken too much of this medicine contact apoison control center or emergency room at once. NOTE: This medicine is only for you. Do not share this medicine with others. What if I miss a dose? It is important not to miss your dose. Call your doctor or health careprofessional if you are unable to keep an appointment. What may interact with this medication? Interactions have not been studied. This list may not describe all possible interactions. Give your health care provider a list of all the medicines, herbs, non-prescription drugs, or dietary supplements you use. Also tell them if you smoke, drink alcohol, or use illegaldrugs. Some items may interact with your medicine. What should I watch for while using this medication? This drug may make you feel generally unwell. Continue your course of treatmenteven though you feel ill unless your doctor tells you to stop. You may need blood work done while you are taking this medicine. Do not become pregnant while taking this medicine or for 3 months after stopping it. Women should inform their doctor if they wish to become pregnant or think they might be pregnant. There is a potential for serious side effects to an unborn child. Talk to your health care professional or pharmacist  for more information. Do not breast-feed an infant while taking this medicine orfor 3 months after stopping it. What side effects may I notice from receiving this medication? Side effects that you should report to your doctor or health care professionalas soon as possible: allergic reactions like skin rash, itching or hives, swelling of the face, lips, or tongue black, tarry stools bloody or watery diarrhea breathing problems change in emotions or moods change in sex drive changes in vision chest pain or chest tightness chills confusion cough facial flushing fever headache signs and symptoms of high blood sugar such as dizziness; dry mouth; dry skin; fruity breath; nausea; stomach pain; increased hunger or thirst; increased urination signs and symptoms of liver injury like dark yellow or brown urine; general ill feeling or flu-like symptoms; light-colored stools; loss of appetite; nausea; right upper belly pain; unusually weak or tired; yellowing of the eyes or skin stomach pain trouble passing urine or change in the amount of urine weight gain or weight loss Side effects that usually do not require medical attention (report these toyour doctor or health care professional if they continue or are bothersome): bone pain constipation loss of appetite muscle pain nausea swelling of the ankles, feet, hands tiredness This list may not describe all possible side effects. Call your doctor for medical advice about side effects. You may report  side effects to FDA at1-800-FDA-1088. Where should I keep my medication? This drug is given in a hospital or clinic and will not be stored at home. NOTE: This sheet is a summary. It may not cover all possible information. If you have questions about this medicine, talk to your doctor, pharmacist, orhealth care provider.  2022 Elsevier/Gold Standard (2019-04-01 13:01:29)  Gemcitabine injection What is this medication? GEMCITABINE (jem SYE ta been)  is a chemotherapy drug. This medicine is used to treat many types of cancer like breast cancer, lung cancer, pancreatic cancer,and ovarian cancer. This medicine may be used for other purposes; ask your health care provider orpharmacist if you have questions. COMMON BRAND NAME(S): Gemzar, Infugem What should I tell my care team before I take this medication? They need to know if you have any of these conditions: blood disorders infection kidney disease liver disease lung or breathing disease, like asthma recent or ongoing radiation therapy an unusual or allergic reaction to gemcitabine, other chemotherapy, other medicines, foods, dyes, or preservatives pregnant or trying to get pregnant breast-feeding How should I use this medication? This drug is given as an infusion into a vein. It is administered in a hospitalor clinic by a specially trained health care professional. Talk to your pediatrician regarding the use of this medicine in children.Special care may be needed. Overdosage: If you think you have taken too much of this medicine contact apoison control center or emergency room at once. NOTE: This medicine is only for you. Do not share this medicine with others. What if I miss a dose? It is important not to miss your dose. Call your doctor or health careprofessional if you are unable to keep an appointment. What may interact with this medication? medicines to increase blood counts like filgrastim, pegfilgrastim, sargramostim some other chemotherapy drugs like cisplatin vaccines Talk to your doctor or health care professional before taking any of thesemedicines: acetaminophen aspirin ibuprofen ketoprofen naproxen This list may not describe all possible interactions. Give your health care provider a list of all the medicines, herbs, non-prescription drugs, or dietary supplements you use. Also tell them if you smoke, drink alcohol, or use illegaldrugs. Some items may interact with your  medicine. What should I watch for while using this medication? Visit your doctor for checks on your progress. This drug may make you feel generally unwell. This is not uncommon, as chemotherapy can affect healthy cells as well as cancer cells. Report any side effects. Continue your course oftreatment even though you feel ill unless your doctor tells you to stop. In some cases, you may be given additional medicines to help with side effects.Follow all directions for their use. Call your doctor or health care professional for advice if you get a fever, chills or sore throat, or other symptoms of a cold or flu. Do not treat yourself. This drug decreases your body's ability to fight infections. Try toavoid being around people who are sick. This medicine may increase your risk to bruise or bleed. Call your doctor orhealth care professional if you notice any unusual bleeding. Be careful brushing and flossing your teeth or using a toothpick because you may get an infection or bleed more easily. If you have any dental work done,tell your dentist you are receiving this medicine. Avoid taking products that contain aspirin, acetaminophen, ibuprofen, naproxen, or ketoprofen unless instructed by your doctor. These medicines may hide afever. Do not become pregnant while taking this medicine or for 6 months after stopping it. Women should inform  their doctor if they wish to become pregnant or think they might be pregnant. Men should not father a child while taking this medicine and for 3 months after stopping it. There is a potential for serious side effects to an unborn child. Talk to your health care professional or pharmacist for more information. Do not breast-feed an infant while takingthis medicine or for at least 1 week after stopping it. Men should inform their doctors if they wish to father a child. This medicine may lower sperm counts. Talk with your doctor or health care professional ifyou are concerned about  your fertility. What side effects may I notice from receiving this medication? Side effects that you should report to your doctor or health care professionalas soon as possible: allergic reactions like skin rash, itching or hives, swelling of the face, lips, or tongue breathing problems pain, redness, or irritation at site where injected signs and symptoms of a dangerous change in heartbeat or heart rhythm like chest pain; dizziness; fast or irregular heartbeat; palpitations; feeling faint or lightheaded, falls; breathing problems signs of decreased platelets or bleeding - bruising, pinpoint red spots on the skin, black, tarry stools, blood in the urine signs of decreased red blood cells - unusually weak or tired, feeling faint or lightheaded, falls signs of infection - fever or chills, cough, sore throat, pain or difficulty passing urine signs and symptoms of kidney injury like trouble passing urine or change in the amount of urine signs and symptoms of liver injury like dark yellow or brown urine; general ill feeling or flu-like symptoms; light-colored stools; loss of appetite; nausea; right upper belly pain; unusually weak or tired; yellowing of the eyes or skin swelling of ankles, feet, hands Side effects that usually do not require medical attention (report to yourdoctor or health care professional if they continue or are bothersome): constipation diarrhea hair loss loss of appetite nausea rash vomiting This list may not describe all possible side effects. Call your doctor for medical advice about side effects. You may report side effects to FDA at1-800-FDA-1088. Where should I keep my medication? This drug is given in a hospital or clinic and will not be stored at home. NOTE: This sheet is a summary. It may not cover all possible information. If you have questions about this medicine, talk to your doctor, pharmacist, orhealth care provider.  2022 Elsevier/Gold Standard (2017-04-16  18:06:11)  Cisplatin injection What is this medication? CISPLATIN (SIS pla tin) is a chemotherapy drug. It targets fast dividing cells, like cancer cells, and causes these cells to die. This medicine is used totreat many types of cancer like bladder, ovarian, and testicular cancers. This medicine may be used for other purposes; ask your health care provider orpharmacist if you have questions. COMMON BRAND NAME(S): Platinol, Platinol -AQ What should I tell my care team before I take this medication? They need to know if you have any of these conditions: eye disease, vision problems hearing problems kidney disease low blood counts, like white cells, platelets, or red blood cells tingling of the fingers or toes, or other nerve disorder an unusual or allergic reaction to cisplatin, carboplatin, oxaliplatin, other medicines, foods, dyes, or preservatives pregnant or trying to get pregnant breast-feeding How should I use this medication? This drug is given as an infusion into a vein. It is administered in a hospitalor clinic by a specially trained health care professional. Talk to your pediatrician regarding the use of this medicine in children.Special care may be needed. Overdosage: If  you think you have taken too much of this medicine contact apoison control center or emergency room at once. NOTE: This medicine is only for you. Do not share this medicine with others. What if I miss a dose? It is important not to miss a dose. Call your doctor or health careprofessional if you are unable to keep an appointment. What may interact with this medication? This medicine may interact with the following medications: foscarnet certain antibiotics like amikacin, gentamicin, neomycin, polymyxin B, streptomycin, tobramycin, vancomycin This list may not describe all possible interactions. Give your health care provider a list of all the medicines, herbs, non-prescription drugs, or dietary supplements you  use. Also tell them if you smoke, drink alcohol, or use illegaldrugs. Some items may interact with your medicine. What should I watch for while using this medication? Your condition will be monitored carefully while you are receiving this medicine. You will need important blood work done while you are taking thismedicine. This drug may make you feel generally unwell. This is not uncommon, as chemotherapy can affect healthy cells as well as cancer cells. Report any side effects. Continue your course of treatment even though you feel ill unless yourdoctor tells you to stop. This medicine may increase your risk of getting an infection. Call your healthcare professional for advice if you get a fever, chills, or sore throat, or other symptoms of a cold or flu. Do not treat yourself. Try to avoid beingaround people who are sick. Avoid taking medicines that contain aspirin, acetaminophen, ibuprofen, naproxen, or ketoprofen unless instructed by your healthcare professional.These medicines may hide a fever. This medicine may increase your risk to bruise or bleed. Call your doctor orhealth care professional if you notice any unusual bleeding. Be careful brushing and flossing your teeth or using a toothpick because you may get an infection or bleed more easily. If you have any dental work done,tell your dentist you are receiving this medicine. Do not become pregnant while taking this medicine or for 14 months after stopping it. Women should inform their healthcare professional if they wish to become pregnant or think they might be pregnant. Men should not father a child while taking this medicine and for 11 months after stopping it. There is potential for serious side effects to an unborn child. Talk to your healthcareprofessional for more information. Do not breast-feed an infant while taking this medicine. This medicine has caused ovarian failure in some women. This medicine may make it more difficult to get  pregnant. Talk to your healthcare professional if Matthew Osborne concerned about your fertility. This medicine has caused decreased sperm counts in some men. This may make it more difficult to father a child. Talk to your healthcare professional if Matthew Osborne concerned about your fertility. Drink fluids as directed while you are taking this medicine. This will helpprotect your kidneys. Call your doctor or health care professional if you get diarrhea. Do not treatyourself. What side effects may I notice from receiving this medication? Side effects that you should report to your doctor or health care professionalas soon as possible: allergic reactions like skin rash, itching or hives, swelling of the face, lips, or tongue blurred vision changes in vision decreased hearing or ringing of the ears nausea, vomiting pain, redness, or irritation at site where injected pain, tingling, numbness in the hands or feet signs and symptoms of bleeding such as bloody or black, tarry stools; red or dark brown urine; spitting up blood or brown material that looks like coffee grounds;  red spots on the skin; unusual bruising or bleeding from the eyes, gums, or nose signs and symptoms of infection like fever; chills; cough; sore throat; pain or trouble passing urine signs and symptoms of kidney injury like trouble passing urine or change in the amount of urine signs and symptoms of low red blood cells or anemia such as unusually weak or tired; feeling faint or lightheaded; falls; breathing problems Side effects that usually do not require medical attention (report to yourdoctor or health care professional if they continue or are bothersome): loss of appetite mouth sores muscle cramps This list may not describe all possible side effects. Call your doctor for medical advice about side effects. You may report side effects to FDA at1-800-FDA-1088. Where should I keep my medication? This drug is given in a hospital or clinic and  will not be stored at home. NOTE: This sheet is a summary. It may not cover all possible information. If you have questions about this medicine, talk to your doctor, pharmacist, orhealth care provider.  2022 Elsevier/Gold Standard (2018-01-16 15:59:17)

## 2020-10-02 NOTE — Progress Notes (Signed)
Matthew Osborne   Diagnosis: Cholangiocarcinoma  INTERVAL HISTORY:   Matthew Osborne completed another cycle of gemcitabine/cisplatin/Durvalumab beginning 09/11/2020.  No nausea/vomiting, fever, or rash.  He has a mild intermittent cough.  He developed pain in the left subcostal region approximately 2 weeks ago.  This has persisted.  He had an episode of hypoglycemia on day 3 following the last treatment with chemotherapy.  Good appetite.  Objective:  Vital signs in last 24 hours:  Blood pressure (!) 157/100, pulse 70, temperature 98 F (36.7 C), temperature source Oral, resp. rate 18, height 6' 2"  (1.88 m), weight 237 lb (107.5 kg), SpO2 100 %.    HEENT: No thrush or ulcers Resp: Lungs clear bilaterally Cardio: Regular rate and rhythm GI: No hepatosplenomegaly, mild tenderness in left upper quadrant, no mass Vascular: No leg edema  Portacath/PICC-without erythema  Lab Results:  Lab Results  Component Value Date   WBC 5.9 10/02/2020   HGB 10.6 (L) 10/02/2020   HCT 32.2 (L) 10/02/2020   MCV 102.9 (H) 10/02/2020   PLT 183 10/02/2020   NEUTROABS 3.8 10/02/2020    CMP  Lab Results  Component Value Date   NA 136 10/02/2020   K 4.0 10/02/2020   CL 102 10/02/2020   CO2 28 10/02/2020   GLUCOSE 200 (H) 10/02/2020   BUN 14 10/02/2020   CREATININE 0.88 10/02/2020   CALCIUM 9.1 10/02/2020   PROT 7.1 10/02/2020   ALBUMIN 3.7 10/02/2020   AST 29 10/02/2020   ALT 18 10/02/2020   ALKPHOS 132 (H) 10/02/2020   BILITOT 0.5 10/02/2020   GFRNONAA >60 10/02/2020   GFRAA 127 01/11/2020    Lab Results  Component Value Date   CEA1 2.15 01/14/2020   KNL976 82 (H) 08/21/2020     Medications: I have reviewed the patient's current medications.   Assessment/Plan: Cholangiocarcinoma  multiple liver masses and abdominal lymphadenopathy CTs 01/13/2020-rounded hypodense mass appears to arise from the pancreas neck, multiple rim-enhancing masses in the  liver, primarily left liver with segmental dilation of the left lobe Intermatic bile ducts, ill-defined hypodensity the central liver with effacement of the left portal vein, enlarged portacaval and retroperitoneal lymph nodes Ultrasound-guided biopsy of the left liver lesion 01/19/2020-adenocarcinoma, cytokeratin 7+, MSS, tumor mutation burden 1, IDH1 R132C MRI abdomen 01/31/2020-poorly marginated central liver mass, multiple smaller similar satellite liver masses, extrinsic mass-effect at the biliary hilum with intrahepatic biliary ductal dilatation throughout the left liver with mild Intermatic dilatation the superior right liver, no pancreas mass or ductal dilatation, normal spleen size, numerous enlarged enhancing lymph nodes at the porta hepatis, peripancreatic, portacaval, aortocaval, left periaortic chains Cycle 1 gemcitabine/cisplatin 02/04/2020 Cycle 2 gemcitabine/cisplatin 02/28/2020 Cycle 3 gemcitabine/cisplatin 03/20/2020 CT abdomen/pelvis 03/31/2020-mild decrease in central liver tumor, mild decrease in size of liver metastases and upper abdominal adenopathy, new splenomegaly Cycle 4 gemcitabine/cisplatin 04/10/2020 Cycle 5 gemcitabine/cisplatin 05/01/2020 Cycle 6 gemcitabine/cisplatin plus durvalumab 05/22/2020 CTs 06/06/2020- dominant central liver mass mildly enlarged, other liver lesions and abdominal adenopathy is stable, no evidence of metastatic disease to the chest Cycle 7 gemcitabine/cisplatin plus Durvalumab 06/12/2020 Cycle 8 gemcitabine/cisplatin plus Durvalumab 06/30/2020 Cycle 9 gemcitabine/cisplatin plus Durvalumab 07/31/2020 CT abdomen/pelvis 08/20/2020-decreased size of dominant central liver mass, slight increase in size and stable additional liver lesions, stable portacaval node Cycle 10 gemcitabine/cisplatin plus Durvalumab 08/21/2020 Cycle 11 gemcitabine/cisplatin plus Durvalumab 09/11/2020 Cycle 12 gemcitabine/cisplatin plus Durvalumab 10/02/2020   Cough-likely related to  diaphragmatic irritation from #1 Anorexia/weight loss Hypercalcemia-likely hypercalcemia malignancy, status post intravenous  hydration and Zometa 01/14/2020 Diabetes Hyperlipidemia Hypertension History of peripheral neuropathy secondary to diabetes Severe back pain following Fulphila-oxycodone prescribed Thrombocytopenia secondary to chemotherapy-gemcitabine held with day 1 cycle 8 and then dose reduced COVID-19 infection 07/22/2020      Disposition: Matthew Osborne continues to tolerate the systemic therapy well.  He will begin another cycle of gemcitabine/cisplatin plus Durvalumab today.  His CBC and chemistry panel are adequate to begin chemotherapy today.  He will be scheduled for restaging CTs after the next cycle of chemotherapy.  The etiology of the left upper quadrant pain is unclear.  This may be related to benign musculoskeletal condition.  He will call for increased pain.  He is scheduled for day 8 chemotherapy on 10/10/2020.  He will return for an office visit prior to the next cycle of chemotherapy on 10/23/2020.  Betsy Coder, MD  10/02/2020  10:45 AM

## 2020-10-10 ENCOUNTER — Other Ambulatory Visit: Payer: Self-pay

## 2020-10-10 ENCOUNTER — Inpatient Hospital Stay: Payer: No Typology Code available for payment source

## 2020-10-10 ENCOUNTER — Inpatient Hospital Stay: Payer: No Typology Code available for payment source | Attending: Oncology

## 2020-10-10 ENCOUNTER — Other Ambulatory Visit (HOSPITAL_BASED_OUTPATIENT_CLINIC_OR_DEPARTMENT_OTHER): Payer: Self-pay

## 2020-10-10 VITALS — BP 142/87 | HR 68 | Temp 98.3°F | Resp 20 | Ht 74.0 in | Wt 238.4 lb

## 2020-10-10 DIAGNOSIS — C221 Intrahepatic bile duct carcinoma: Secondary | ICD-10-CM | POA: Insufficient documentation

## 2020-10-10 DIAGNOSIS — Z79899 Other long term (current) drug therapy: Secondary | ICD-10-CM | POA: Diagnosis not present

## 2020-10-10 DIAGNOSIS — R059 Cough, unspecified: Secondary | ICD-10-CM | POA: Insufficient documentation

## 2020-10-10 DIAGNOSIS — D6959 Other secondary thrombocytopenia: Secondary | ICD-10-CM | POA: Insufficient documentation

## 2020-10-10 DIAGNOSIS — I10 Essential (primary) hypertension: Secondary | ICD-10-CM | POA: Insufficient documentation

## 2020-10-10 DIAGNOSIS — Z5112 Encounter for antineoplastic immunotherapy: Secondary | ICD-10-CM | POA: Diagnosis present

## 2020-10-10 DIAGNOSIS — Z5189 Encounter for other specified aftercare: Secondary | ICD-10-CM | POA: Insufficient documentation

## 2020-10-10 DIAGNOSIS — E785 Hyperlipidemia, unspecified: Secondary | ICD-10-CM | POA: Insufficient documentation

## 2020-10-10 DIAGNOSIS — E119 Type 2 diabetes mellitus without complications: Secondary | ICD-10-CM | POA: Diagnosis not present

## 2020-10-10 DIAGNOSIS — Z5111 Encounter for antineoplastic chemotherapy: Secondary | ICD-10-CM | POA: Diagnosis present

## 2020-10-10 DIAGNOSIS — Z8616 Personal history of COVID-19: Secondary | ICD-10-CM | POA: Insufficient documentation

## 2020-10-10 DIAGNOSIS — T451X5A Adverse effect of antineoplastic and immunosuppressive drugs, initial encounter: Secondary | ICD-10-CM | POA: Insufficient documentation

## 2020-10-10 LAB — CBC WITH DIFFERENTIAL (CANCER CENTER ONLY)
Abs Immature Granulocytes: 0.02 10*3/uL (ref 0.00–0.07)
Basophils Absolute: 0.1 10*3/uL (ref 0.0–0.1)
Basophils Relative: 2 %
Eosinophils Absolute: 0.3 10*3/uL (ref 0.0–0.5)
Eosinophils Relative: 5 %
HCT: 30.9 % — ABNORMAL LOW (ref 39.0–52.0)
Hemoglobin: 10 g/dL — ABNORMAL LOW (ref 13.0–17.0)
Immature Granulocytes: 0 %
Lymphocytes Relative: 20 %
Lymphs Abs: 1.1 10*3/uL (ref 0.7–4.0)
MCH: 33.2 pg (ref 26.0–34.0)
MCHC: 32.4 g/dL (ref 30.0–36.0)
MCV: 102.7 fL — ABNORMAL HIGH (ref 80.0–100.0)
Monocytes Absolute: 0.5 10*3/uL (ref 0.1–1.0)
Monocytes Relative: 9 %
Neutro Abs: 3.5 10*3/uL (ref 1.7–7.7)
Neutrophils Relative %: 64 %
Platelet Count: 142 10*3/uL — ABNORMAL LOW (ref 150–400)
RBC: 3.01 MIL/uL — ABNORMAL LOW (ref 4.22–5.81)
RDW: 16.8 % — ABNORMAL HIGH (ref 11.5–15.5)
WBC Count: 5.4 10*3/uL (ref 4.0–10.5)
nRBC: 0 % (ref 0.0–0.2)

## 2020-10-10 LAB — CMP (CANCER CENTER ONLY)
ALT: 25 U/L (ref 0–44)
AST: 31 U/L (ref 15–41)
Albumin: 3.7 g/dL (ref 3.5–5.0)
Alkaline Phosphatase: 116 U/L (ref 38–126)
Anion gap: 5 (ref 5–15)
BUN: 21 mg/dL — ABNORMAL HIGH (ref 6–20)
CO2: 29 mmol/L (ref 22–32)
Calcium: 9.3 mg/dL (ref 8.9–10.3)
Chloride: 100 mmol/L (ref 98–111)
Creatinine: 0.99 mg/dL (ref 0.61–1.24)
GFR, Estimated: >60 mL/min (ref >60)
Glucose, Bld: 314 mg/dL — ABNORMAL HIGH (ref 70–99)
Potassium: 4.5 mmol/L (ref 3.5–5.1)
Sodium: 134 mmol/L — ABNORMAL LOW (ref 135–145)
Total Bilirubin: 0.4 mg/dL (ref 0.3–1.2)
Total Protein: 7 g/dL (ref 6.5–8.1)

## 2020-10-10 LAB — MAGNESIUM: Magnesium: 1.9 mg/dL (ref 1.7–2.4)

## 2020-10-10 MED ORDER — HEPARIN SOD (PORK) LOCK FLUSH 100 UNIT/ML IV SOLN
500.0000 [IU] | Freq: Once | INTRAVENOUS | Status: AC | PRN
  Administered 2020-10-10: 500 [IU]

## 2020-10-10 MED ORDER — SODIUM CHLORIDE 0.9 % IV SOLN
150.0000 mg | Freq: Once | INTRAVENOUS | Status: AC
  Administered 2020-10-10: 150 mg via INTRAVENOUS
  Filled 2020-10-10: qty 5

## 2020-10-10 MED ORDER — SODIUM CHLORIDE 0.9 % IV SOLN: Freq: Once | INTRAVENOUS | Status: AC

## 2020-10-10 MED ORDER — POTASSIUM CHLORIDE IN NACL 20-0.9 MEQ/L-% IV SOLN
Freq: Once | INTRAVENOUS | Status: AC
  Filled 2020-10-10: qty 1000

## 2020-10-10 MED ORDER — CISPLATIN CHEMO INJECTION 100MG/100ML
25.0000 mg/m2 | Freq: Once | INTRAVENOUS | Status: AC
  Administered 2020-10-10: 59 mg via INTRAVENOUS
  Filled 2020-10-10: qty 59

## 2020-10-10 MED ORDER — SODIUM CHLORIDE 0.9% FLUSH
10.0000 mL | INTRAVENOUS | Status: DC | PRN
  Administered 2020-10-10: 10 mL

## 2020-10-10 MED ORDER — SODIUM CHLORIDE 0.9 % IV SOLN
800.0000 mg/m2 | Freq: Once | INTRAVENOUS | Status: AC
  Administered 2020-10-10: 1862 mg via INTRAVENOUS
  Filled 2020-10-10: qty 48.97

## 2020-10-10 MED ORDER — DEXAMETHASONE SODIUM PHOSPHATE 10 MG/ML IJ SOLN
5.0000 mg | Freq: Once | INTRAMUSCULAR | Status: AC
  Administered 2020-10-10: 5 mg via INTRAVENOUS
  Filled 2020-10-10: qty 1

## 2020-10-10 MED ORDER — FAMOTIDINE 20 MG IN NS 100 ML IVPB
20.0000 mg | Freq: Once | INTRAVENOUS | Status: AC
  Administered 2020-10-10: 20 mg via INTRAVENOUS
  Filled 2020-10-10: qty 100

## 2020-10-10 MED ORDER — PALONOSETRON HCL INJECTION 0.25 MG/5ML
0.2500 mg | Freq: Once | INTRAVENOUS | Status: AC
  Administered 2020-10-10: 0.25 mg via INTRAVENOUS
  Filled 2020-10-10: qty 5

## 2020-10-10 MED ORDER — MAGNESIUM SULFATE 2 GM/50ML IV SOLN
2.0000 g | Freq: Once | INTRAVENOUS | Status: AC
  Administered 2020-10-10: 2 g via INTRAVENOUS
  Filled 2020-10-10: qty 50

## 2020-10-10 MED FILL — Insulin Pen Needle 31 G X 5 MM (1/5" or 3/16"): 25 days supply | Qty: 100 | Fill #4 | Status: CN

## 2020-10-10 NOTE — Patient Instructions (Signed)
[facility]   Discharge Instructions: Thank you for choosing [facility] to provide your oncology and hematology care.   If you have a lab appointment with the [facility], please go directly to the [facility] and check in at the registration area.   Wear comfortable clothing and clothing appropriate for easy access to any Portacath or PICC line.   We strive to give you quality time with your provider. You may need to reschedule your appointment if you arrive late (15 or more minutes).  Arriving late affects you and other patients whose appointments are after yours.  Also, if you miss three or more appointments without notifying the office, you may be dismissed from the clinic at the provider's discretion.      For prescription refill requests, have your pharmacy contact our office and allow 72 hours for refills to be completed.    Today you received the following chemotherapy and/or immunotherapy agents Gemcitabine (GEMZAR) & Cisplatin (PLATINOL).      To help prevent nausea and vomiting after your treatment, we encourage you to take your nausea medication as directed.  BELOW ARE SYMPTOMS THAT SHOULD BE REPORTED IMMEDIATELY: *FEVER GREATER THAN 100.4 F (38 C) OR HIGHER *CHILLS OR SWEATING *NAUSEA AND VOMITING THAT IS NOT CONTROLLED WITH YOUR NAUSEA MEDICATION *UNUSUAL SHORTNESS OF BREATH *UNUSUAL BRUISING OR BLEEDING *URINARY PROBLEMS (pain or burning when urinating, or frequent urination) *BOWEL PROBLEMS (unusual diarrhea, constipation, pain near the anus) TENDERNESS IN MOUTH AND THROAT WITH OR WITHOUT PRESENCE OF ULCERS (sore throat, sores in mouth, or a toothache) UNUSUAL RASH, SWELLING OR PAIN  UNUSUAL VAGINAL DISCHARGE OR ITCHING   Items with * indicate a potential emergency and should be followed up as soon as possible or go to the Emergency Department if any problems should occur.  Please show the CHEMOTHERAPY ALERT CARD or  IMMUNOTHERAPY ALERT CARD at check-in to the Emergency Department and triage nurse.  Should you have questions after your visit or need to cancel or reschedule your appointment, please contact [facility]  Dept: [phone]  and follow the prompts.  Office hours are 8:00 a.m. to 4:30 p.m. Monday - Friday. Please note that voicemails left after 4:00 p.m. may not be returned until the following business day.  We are closed weekends and major holidays. You have access to a nurse at all times for urgent questions. Please call the main number to the clinic Dept: [phone] and follow the prompts.   For any non-urgent questions, you may also contact your provider using MyChart. We now offer e-Visits for anyone 21 and older to request care online for non-urgent symptoms. For details visit [email or web address].   Also download the MyChart app! Go to the app store, search "MyChart", open the app, select [facility], and log in with your MyChart username and password.  Due to Covid, a mask is required upon entering the hospital/clinic. If you do not have a mask, one will be given to you upon arrival. For doctor visits, patients may have 1 support person aged 37 or older with them. For treatment visits, patients cannot have anyone with them due to current Covid guidelines and our immunocompromised population.    Gemcitabine injection What is this medication? GEMCITABINE (jem SYE ta been) is a chemotherapy drug. This medicine is used to treat many types of cancer like breast cancer, lung cancer, pancreatic cancer, and ovarian cancer. This medicine may be used for other purposes;  ask your health care provider or pharmacist if you have questions. COMMON BRAND NAME(S): Gemzar, Infugem What should I tell my care team before I take this medication? They need to know if you have any of these conditions: blood disorders infection kidney disease liver disease lung or breathing  disease, like asthma recent or ongoing radiation therapy an unusual or allergic reaction to gemcitabine, other chemotherapy, other medicines, foods, dyes, or preservatives pregnant or trying to get pregnant breast-feeding How should I use this medication? This drug is given as an infusion into a vein. It is administered in a hospital or clinic by a specially trained health care professional. Talk to your pediatrician regarding the use of this medicine in children. Special care may be needed. Overdosage: If you think you have taken too much of this medicine contact a poison control center or emergency room at once. NOTE: This medicine is only for you. Do not share this medicine with others. What if I miss a dose? It is important not to miss your dose. Call your doctor or health care professional if you are unable to keep an appointment. What may interact with this medication? medicines to increase blood counts like filgrastim, pegfilgrastim, sargramostim some other chemotherapy drugs like cisplatin vaccines Talk to your doctor or health care professional before taking any of these medicines: acetaminophen aspirin ibuprofen ketoprofen naproxen This list may not describe all possible interactions. Give your health care provider a list of all the medicines, herbs, non-prescription drugs, or dietary supplements you use. Also tell them if you smoke, drink alcohol, or use illegal drugs. Some items may interact with your medicine. What should I watch for while using this medication? Visit your doctor for checks on your progress. This drug may make you feel generally unwell. This is not uncommon, as chemotherapy can affect healthy cells as well as cancer cells. Report any side effects. Continue your course of treatment even though you feel ill unless your doctor tells you to stop. In some cases, you may be given additional medicines to help with side effects. Follow all directions for their  use. Call your doctor or health care professional for advice if you get a fever, chills or sore throat, or other symptoms of a cold or flu. Do not treat yourself. This drug decreases your body's ability to fight infections. Try to avoid being around people who are sick. This medicine may increase your risk to bruise or bleed. Call your doctor or health care professional if you notice any unusual bleeding. Be careful brushing and flossing your teeth or using a toothpick because you may get an infection or bleed more easily. If you have any dental work done, tell your dentist you are receiving this medicine. Avoid taking products that contain aspirin, acetaminophen, ibuprofen, naproxen, or ketoprofen unless instructed by your doctor. These medicines may hide a fever. Do not become pregnant while taking this medicine or for 6 months after stopping it. Women should inform their doctor if they wish to become pregnant or think they might be pregnant. Men should not father a child while taking this medicine and for 3 months after stopping it. There is a potential for serious side effects to an unborn child. Talk to your health care professional or pharmacist for more information. Do not breast-feed an infant while taking this medicine or for at least 1 week after stopping it. Men should inform their doctors if they wish to father a child. This medicine may lower sperm counts. Talk  with your doctor or health care professional if you are concerned about your fertility. What side effects may I notice from receiving this medication? Side effects that you should report to your doctor or health care professional as soon as possible: allergic reactions like skin rash, itching or hives, swelling of the face, lips, or tongue breathing problems pain, redness, or irritation at site where injected signs and symptoms of a dangerous change in heartbeat or heart rhythm like chest pain; dizziness; fast or irregular heartbeat;  palpitations; feeling faint or lightheaded, falls; breathing problems signs of decreased platelets or bleeding - bruising, pinpoint red spots on the skin, black, tarry stools, blood in the urine signs of decreased red blood cells - unusually weak or tired, feeling faint or lightheaded, falls signs of infection - fever or chills, cough, sore throat, pain or difficulty passing urine signs and symptoms of kidney injury like trouble passing urine or change in the amount of urine signs and symptoms of liver injury like dark yellow or brown urine; general ill feeling or flu-like symptoms; light-colored stools; loss of appetite; nausea; right upper belly pain; unusually weak or tired; yellowing of the eyes or skin swelling of ankles, feet, hands Side effects that usually do not require medical attention (report to your doctor or health care professional if they continue or are bothersome): constipation diarrhea hair loss loss of appetite nausea rash vomiting This list may not describe all possible side effects. Call your doctor for medical advice about side effects. You may report side effects to FDA at 1-800-FDA-1088. Where should I keep my medication? This drug is given in a hospital or clinic and will not be stored at home. NOTE: This sheet is a summary. It may not cover all possible information. If you have questions about this medicine, talk to your doctor, pharmacist, or health care provider.  2022 Elsevier/Gold Standard (2017-04-16 18:06:11)  Cisplatin injection What is this medication? CISPLATIN (SIS pla tin) is a chemotherapy drug. It targets fast dividing cells, like cancer cells, and causes these cells to die. This medicine is used to treat many types of cancer like bladder, ovarian, and testicular cancers. This medicine may be used for other purposes; ask your health care provider or pharmacist if you have questions. COMMON BRAND NAME(S): Platinol, Platinol -AQ What should I tell my  care team before I take this medication? They need to know if you have any of these conditions: eye disease, vision problems hearing problems kidney disease low blood counts, like white cells, platelets, or red blood cells tingling of the fingers or toes, or other nerve disorder an unusual or allergic reaction to cisplatin, carboplatin, oxaliplatin, other medicines, foods, dyes, or preservatives pregnant or trying to get pregnant breast-feeding How should I use this medication? This drug is given as an infusion into a vein. It is administered in a hospital or clinic by a specially trained health care professional. Talk to your pediatrician regarding the use of this medicine in children. Special care may be needed. Overdosage: If you think you have taken too much of this medicine contact a poison control center or emergency room at once. NOTE: This medicine is only for you. Do not share this medicine with others. What if I miss a dose? It is important not to miss a dose. Call your doctor or health care professional if you are unable to keep an appointment. What may interact with this medication? This medicine may interact with the following medications: foscarnet certain antibiotics like  amikacin, gentamicin, neomycin, polymyxin B, streptomycin, tobramycin, vancomycin This list may not describe all possible interactions. Give your health care provider a list of all the medicines, herbs, non-prescription drugs, or dietary supplements you use. Also tell them if you smoke, drink alcohol, or use illegal drugs. Some items may interact with your medicine. What should I watch for while using this medication? Your condition will be monitored carefully while you are receiving this medicine. You will need important blood work done while you are taking this medicine. This drug may make you feel generally unwell. This is not uncommon, as chemotherapy can affect healthy cells as well as cancer cells.  Report any side effects. Continue your course of treatment even though you feel ill unless your doctor tells you to stop. This medicine may increase your risk of getting an infection. Call your healthcare professional for advice if you get a fever, chills, or sore throat, or other symptoms of a cold or flu. Do not treat yourself. Try to avoid being around people who are sick. Avoid taking medicines that contain aspirin, acetaminophen, ibuprofen, naproxen, or ketoprofen unless instructed by your healthcare professional. These medicines may hide a fever. This medicine may increase your risk to bruise or bleed. Call your doctor or health care professional if you notice any unusual bleeding. Be careful brushing and flossing your teeth or using a toothpick because you may get an infection or bleed more easily. If you have any dental work done, tell your dentist you are receiving this medicine. Do not become pregnant while taking this medicine or for 14 months after stopping it. Women should inform their healthcare professional if they wish to become pregnant or think they might be pregnant. Men should not father a child while taking this medicine and for 11 months after stopping it. There is potential for serious side effects to an unborn child. Talk to your healthcare professional for more information. Do not breast-feed an infant while taking this medicine. This medicine has caused ovarian failure in some women. This medicine may make it more difficult to get pregnant. Talk to your healthcare professional if you are concerned about your fertility. This medicine has caused decreased sperm counts in some men. This may make it more difficult to father a child. Talk to your healthcare professional if you are concerned about your fertility. Drink fluids as directed while you are taking this medicine. This will help protect your kidneys. Call your doctor or health care professional if you get diarrhea. Do not treat  yourself. What side effects may I notice from receiving this medication? Side effects that you should report to your doctor or health care professional as soon as possible: allergic reactions like skin rash, itching or hives, swelling of the face, lips, or tongue blurred vision changes in vision decreased hearing or ringing of the ears nausea, vomiting pain, redness, or irritation at site where injected pain, tingling, numbness in the hands or feet signs and symptoms of bleeding such as bloody or black, tarry stools; red or dark brown urine; spitting up blood or brown material that looks like coffee grounds; red spots on the skin; unusual bruising or bleeding from the eyes, gums, or nose signs and symptoms of infection like fever; chills; cough; sore throat; pain or trouble passing urine signs and symptoms of kidney injury like trouble passing urine or change in the amount of urine signs and symptoms of low red blood cells or anemia such as unusually weak or tired; feeling faint  or lightheaded; falls; breathing problems Side effects that usually do not require medical attention (report to your doctor or health care professional if they continue or are bothersome): loss of appetite mouth sores muscle cramps This list may not describe all possible side effects. Call your doctor for medical advice about side effects. You may report side effects to FDA at 1-800-FDA-1088. Where should I keep my medication? This drug is given in a hospital or clinic and will not be stored at home. NOTE: This sheet is a summary. It may not cover all possible information. If you have questions about this medicine, talk to your doctor, pharmacist, or health care provider.  2022 Elsevier/Gold Standard (2018-01-16 15:59:17)

## 2020-10-10 NOTE — Progress Notes (Signed)
Patient presents for treatment. RN assessment completed along with the following:  Labs reviewed - WNL Vitals reviewed including weight - WNL Oncology Treatment Attestation completed - YES Consent completed and reflects current therapy/intent - YES MD/APP progress note reviewed - Note from 10/02/20 Reviewed Treatment plan/orders reviewed - YES Last treatment date reviewed and appropriate      amount of time has elapsed between treatments.  Patient to proceed with treatment.

## 2020-10-11 ENCOUNTER — Inpatient Hospital Stay: Payer: No Typology Code available for payment source

## 2020-10-11 VITALS — BP 130/73 | HR 80 | Temp 98.3°F | Resp 18

## 2020-10-11 DIAGNOSIS — Z5112 Encounter for antineoplastic immunotherapy: Secondary | ICD-10-CM | POA: Diagnosis not present

## 2020-10-11 DIAGNOSIS — C221 Intrahepatic bile duct carcinoma: Secondary | ICD-10-CM

## 2020-10-11 MED ORDER — PEGFILGRASTIM-JMDB 6 MG/0.6ML ~~LOC~~ SOSY
6.0000 mg | PREFILLED_SYRINGE | Freq: Once | SUBCUTANEOUS | Status: AC
  Administered 2020-10-11: 6 mg via SUBCUTANEOUS

## 2020-10-11 NOTE — Patient Instructions (Signed)

## 2020-10-22 ENCOUNTER — Other Ambulatory Visit: Payer: Self-pay | Admitting: Oncology

## 2020-10-23 ENCOUNTER — Inpatient Hospital Stay: Payer: No Typology Code available for payment source

## 2020-10-23 ENCOUNTER — Other Ambulatory Visit (HOSPITAL_BASED_OUTPATIENT_CLINIC_OR_DEPARTMENT_OTHER): Payer: Self-pay

## 2020-10-23 ENCOUNTER — Other Ambulatory Visit: Payer: Self-pay

## 2020-10-23 ENCOUNTER — Inpatient Hospital Stay (HOSPITAL_BASED_OUTPATIENT_CLINIC_OR_DEPARTMENT_OTHER): Payer: No Typology Code available for payment source | Admitting: Oncology

## 2020-10-23 VITALS — BP 148/92 | HR 97 | Temp 98.1°F | Resp 18 | Ht 74.0 in | Wt 239.0 lb

## 2020-10-23 VITALS — BP 154/82 | HR 66 | Temp 98.0°F | Resp 18

## 2020-10-23 DIAGNOSIS — Z5112 Encounter for antineoplastic immunotherapy: Secondary | ICD-10-CM | POA: Diagnosis not present

## 2020-10-23 DIAGNOSIS — C221 Intrahepatic bile duct carcinoma: Secondary | ICD-10-CM | POA: Diagnosis not present

## 2020-10-23 LAB — MAGNESIUM: Magnesium: 2 mg/dL (ref 1.7–2.4)

## 2020-10-23 LAB — CBC WITH DIFFERENTIAL (CANCER CENTER ONLY)
Abs Immature Granulocytes: 0.02 10*3/uL (ref 0.00–0.07)
Basophils Absolute: 0.1 10*3/uL (ref 0.0–0.1)
Basophils Relative: 1 %
Eosinophils Absolute: 0.4 10*3/uL (ref 0.0–0.5)
Eosinophils Relative: 5 %
HCT: 30.3 % — ABNORMAL LOW (ref 39.0–52.0)
Hemoglobin: 10 g/dL — ABNORMAL LOW (ref 13.0–17.0)
Immature Granulocytes: 0 %
Lymphocytes Relative: 13 %
Lymphs Abs: 0.9 10*3/uL (ref 0.7–4.0)
MCH: 34.5 pg — ABNORMAL HIGH (ref 26.0–34.0)
MCHC: 33 g/dL (ref 30.0–36.0)
MCV: 104.5 fL — ABNORMAL HIGH (ref 80.0–100.0)
Monocytes Absolute: 0.8 10*3/uL (ref 0.1–1.0)
Monocytes Relative: 11 %
Neutro Abs: 5 10*3/uL (ref 1.7–7.7)
Neutrophils Relative %: 70 %
Platelet Count: 143 10*3/uL — ABNORMAL LOW (ref 150–400)
RBC: 2.9 MIL/uL — ABNORMAL LOW (ref 4.22–5.81)
RDW: 17.2 % — ABNORMAL HIGH (ref 11.5–15.5)
WBC Count: 7.1 10*3/uL (ref 4.0–10.5)
nRBC: 0 % (ref 0.0–0.2)

## 2020-10-23 LAB — CMP (CANCER CENTER ONLY)
ALT: 17 U/L (ref 0–44)
AST: 32 U/L (ref 15–41)
Albumin: 3.8 g/dL (ref 3.5–5.0)
Alkaline Phosphatase: 172 U/L — ABNORMAL HIGH (ref 38–126)
Anion gap: 6 (ref 5–15)
BUN: 16 mg/dL (ref 6–20)
CO2: 28 mmol/L (ref 22–32)
Calcium: 8.9 mg/dL (ref 8.9–10.3)
Chloride: 101 mmol/L (ref 98–111)
Creatinine: 1.02 mg/dL (ref 0.61–1.24)
GFR, Estimated: >60 mL/min (ref >60)
Glucose, Bld: 272 mg/dL — ABNORMAL HIGH (ref 70–99)
Potassium: 4.2 mmol/L (ref 3.5–5.1)
Sodium: 135 mmol/L (ref 135–145)
Total Bilirubin: 0.7 mg/dL (ref 0.3–1.2)
Total Protein: 6.7 g/dL (ref 6.5–8.1)

## 2020-10-23 MED ORDER — SODIUM CHLORIDE 0.9 % IV SOLN: Freq: Once | INTRAVENOUS | Status: AC

## 2020-10-23 MED ORDER — HEPARIN SOD (PORK) LOCK FLUSH 100 UNIT/ML IV SOLN
500.0000 [IU] | Freq: Once | INTRAVENOUS | Status: AC | PRN
  Administered 2020-10-23: 500 [IU]

## 2020-10-23 MED ORDER — SODIUM CHLORIDE 0.9 % IV SOLN
25.0000 mg/m2 | Freq: Once | INTRAVENOUS | Status: AC
  Administered 2020-10-23: 59 mg via INTRAVENOUS
  Filled 2020-10-23: qty 50

## 2020-10-23 MED ORDER — SODIUM CHLORIDE 0.9 % IV SOLN
1500.0000 mg | Freq: Once | INTRAVENOUS | Status: AC
  Administered 2020-10-23: 1500 mg via INTRAVENOUS
  Filled 2020-10-23: qty 30

## 2020-10-23 MED ORDER — PALONOSETRON HCL INJECTION 0.25 MG/5ML
0.2500 mg | Freq: Once | INTRAVENOUS | Status: AC
  Administered 2020-10-23: 0.25 mg via INTRAVENOUS
  Filled 2020-10-23: qty 5

## 2020-10-23 MED ORDER — SODIUM CHLORIDE 0.9 % IV SOLN
800.0000 mg/m2 | Freq: Once | INTRAVENOUS | Status: AC
  Administered 2020-10-23: 1862 mg via INTRAVENOUS
  Filled 2020-10-23: qty 48.97

## 2020-10-23 MED ORDER — ACETAMINOPHEN 325 MG PO TABS
650.0000 mg | ORAL_TABLET | Freq: Once | ORAL | Status: AC
  Administered 2020-10-23: 650 mg via ORAL
  Filled 2020-10-23: qty 2

## 2020-10-23 MED ORDER — POTASSIUM CHLORIDE IN NACL 20-0.9 MEQ/L-% IV SOLN
Freq: Once | INTRAVENOUS | Status: AC
  Filled 2020-10-23: qty 1000

## 2020-10-23 MED ORDER — MAGNESIUM SULFATE 2 GM/50ML IV SOLN
2.0000 g | Freq: Once | INTRAVENOUS | Status: AC
  Administered 2020-10-23: 2 g via INTRAVENOUS
  Filled 2020-10-23: qty 50

## 2020-10-23 MED ORDER — DEXAMETHASONE SODIUM PHOSPHATE 10 MG/ML IJ SOLN
5.0000 mg | Freq: Once | INTRAMUSCULAR | Status: AC
  Administered 2020-10-23: 5 mg via INTRAVENOUS
  Filled 2020-10-23: qty 1

## 2020-10-23 MED ORDER — FAMOTIDINE 20 MG IN NS 100 ML IVPB
20.0000 mg | Freq: Once | INTRAVENOUS | Status: AC
  Administered 2020-10-23: 20 mg via INTRAVENOUS
  Filled 2020-10-23: qty 100

## 2020-10-23 MED ORDER — SODIUM CHLORIDE 0.9 % IV SOLN
150.0000 mg | Freq: Once | INTRAVENOUS | Status: AC
  Administered 2020-10-23: 150 mg via INTRAVENOUS
  Filled 2020-10-23: qty 5

## 2020-10-23 MED ORDER — SODIUM CHLORIDE 0.9% FLUSH
10.0000 mL | INTRAVENOUS | Status: DC | PRN
Start: 2020-10-23 — End: 2020-10-23
  Administered 2020-10-23: 10 mL

## 2020-10-23 MED FILL — Insulin Pen Needle 31 G X 5 MM (1/5" or 3/16"): 25 days supply | Qty: 100 | Fill #4 | Status: AC

## 2020-10-23 NOTE — Progress Notes (Signed)
[name] OFFICE PROGRESS NOTE   Diagnosis: Cholangiocarcinoma  INTERVAL HISTORY:   [name] completed another cycle of gemcitabine/cisplatin/Durvalumab beginning 10/02/2020.  He reports increased neuropathy symptoms in the extremities.  This is intermittent.  The left upper abdominal pain has improved.  He noted a "sore tooth "at the right lower gum beginning today.  Objective:  Vital signs in last 24 hours:  Blood pressure (!) 148/92, pulse 97, temperature 98.1 F (36.7 C), temperature source Oral, resp. rate 18, height $RemoveBe'6\' 2"'[id number]$  (1.88 m), weight 239 lb (108.4 kg), SpO2 99 %.    HEENT: No thrush or ulcers, multiple missing teeth, no apparent abnormality along the right lower gumline or remaining teeth Resp: Lungs clear bilaterally Cardio: Regular rate and rhythm GI: No hepatosplenomegaly, nontender, no mass Vascular: No leg edema Neurologic: The vibratory sense is intact at the fingertips bilaterally  Portacath/PICC-without erythema  Lab Results:  Lab Results  Component Value Date   WBC 7.1 10/23/2020   HGB 10.0 (L) 10/23/2020   HCT 30.3 (L) 10/23/2020   MCV 104.5 (H) 10/23/2020   PLT 143 (L) 10/23/2020   NEUTROABS 5.0 10/23/2020    CMP  Lab Results  Component Value Date   NA 134 (L) 10/10/2020   K 4.5 10/10/2020   CL 100 10/10/2020   CO2 29 10/10/2020   GLUCOSE 314 (H) 10/10/2020   BUN 21 (H) 10/10/2020   CREATININE 0.99 10/10/2020   CALCIUM 9.3 10/10/2020   PROT 7.0 10/10/2020   ALBUMIN 3.7 10/10/2020   AST 31 10/10/2020   ALT 25 10/10/2020   ALKPHOS 116 10/10/2020   BILITOT 0.4 10/10/2020   GFRNONAA >60 10/10/2020   GFRAA 127 01/11/2020    Lab Results  Component Value Date   CEA1 2.15 01/14/2020   CAN199 82 (H) 08/21/2020     Medications: I have reviewed the patient's current medications.   Assessment/Plan:  Cholangiocarcinoma  multiple liver masses and abdominal lymphadenopathy CTs 01/13/2020-rounded hypodense mass appears  to arise from the pancreas neck, multiple rim-enhancing masses in the liver, primarily left liver with segmental dilation of the left lobe Intermatic bile ducts, ill-defined hypodensity the central liver with effacement of the left portal vein, enlarged portacaval and retroperitoneal lymph nodes Ultrasound-guided biopsy of the left liver lesion 01/19/2020-adenocarcinoma, cytokeratin 7+, MSS, tumor mutation burden 1, IDH1 R132C MRI abdomen 01/31/2020-poorly marginated central liver mass, multiple smaller similar satellite liver masses, extrinsic mass-effect at the biliary hilum with intrahepatic biliary ductal dilatation throughout the left liver with mild Intermatic dilatation the superior right liver, no pancreas mass or ductal dilatation, normal spleen size, numerous enlarged enhancing lymph nodes at the porta hepatis, peripancreatic, portacaval, aortocaval, left periaortic chains Cycle 1 gemcitabine/cisplatin 02/04/2020 Cycle 2 gemcitabine/cisplatin 02/28/2020 Cycle 3 gemcitabine/cisplatin 03/20/2020 CT abdomen/pelvis 03/31/2020-mild decrease in central liver tumor, mild decrease in size of liver metastases and upper abdominal adenopathy, new splenomegaly Cycle 4 gemcitabine/cisplatin 04/10/2020 Cycle 5 gemcitabine/cisplatin 05/01/2020 Cycle 6 gemcitabine/cisplatin plus durvalumab 05/22/2020 CTs 06/06/2020- dominant central liver mass mildly enlarged, other liver lesions and abdominal adenopathy is stable, no evidence of metastatic disease to the chest Cycle 7 gemcitabine/cisplatin plus Durvalumab 06/12/2020 Cycle 8 gemcitabine/cisplatin plus Durvalumab 06/30/2020 Cycle 9 gemcitabine/cisplatin plus Durvalumab 07/31/2020 CT abdomen/pelvis 08/20/2020-decreased size of dominant central liver mass, slight increase in size and stable additional liver lesions, stable portacaval node Cycle 10 gemcitabine/cisplatin plus Durvalumab 08/21/2020 Cycle 11 gemcitabine/cisplatin plus Durvalumab 09/11/2020 Cycle 12  gemcitabine/cisplatin plus Durvalumab 10/02/2020 Cycle 13 gemcitabine/cisplatin plus Durvalumab 10/23/2020   Cough-likely related  to diaphragmatic irritation from #1 Anorexia/weight loss Hypercalcemia-likely hypercalcemia malignancy, status post intravenous hydration and Zometa 01/14/2020 Diabetes Hyperlipidemia Hypertension History of peripheral neuropathy secondary to diabetes Severe back pain following Fulphila-oxycodone prescribed Thrombocytopenia secondary to chemotherapy-gemcitabine held with day 1 cycle 8 and then dose reduced COVID-19 infection 07/22/2020     Disposition: Matthew Osborne appears stable.  He will complete another  cycle of gemcitabine/cisplatin/Durvalumab beginning today.  He will undergo a restaging CT after this cycle.  He will return for an office visit and the next cycle of chemotherapy on 11/13/2020.  He understands the neuropathy symptoms could worsen or become permanent with further cisplatin.  He would like to proceed with cisplatin.  I recommended he contact his dentist to evaluate the tooth pain.  Betsy Coder, MD  10/23/2020  8:20 AM

## 2020-10-23 NOTE — Progress Notes (Signed)
Patient presents for treatment. RN assessment completed along with the following:  Treatment Conditions (labs/vitals/weight) reviewed - Yes, and within treatment parameters.   Oncology Treatment Attestation completed for current therapy- Yes, on date 02/01/2020 Informed consent completed and reflects current therapy/intent - Yes, on date 02/04/2020 Gemzar/Cisplatin; 06/12/2020 Imfinzi             Provider progress note reviewed - Today's provider note is not yet available. I reviewed the most recent oncology provider progress note in chart dated 10/02/2020. Treatment/Antibody/Supportive plan reviewed - Yes, and there are no adjustments needed for today's treatment. S&H and other orders reviewed - Yes, and there are no additional orders identified. Previous treatment date reviewed - Yes, and the appropriate amount of time has elapsed between treatments. Clinic Hand Off Received from - Yes, from Merceda Elks, RN & Lenox Ponds, LPN.  Patient to proceed with treatment.

## 2020-10-23 NOTE — Patient Instructions (Signed)
Matthew Osborne   Discharge Instructions: Thank you for choosing [facility] to provide your oncology and hematology care.   If you have a lab appointment with the [facility], please go directly to the [facility] and check in at the registration area.   Wear comfortable clothing and clothing appropriate for easy access to any Portacath or PICC line.   We strive to give you quality time with your provider. You may need to reschedule your appointment if you arrive late (15 or more minutes).  Arriving late affects you and other patients whose appointments are after yours.  Also, if you miss three or more appointments without notifying the office, you may be dismissed from the clinic at the provider's discretion.      For prescription refill requests, have your pharmacy contact our office and allow 72 hours for refills to be completed.    Today you received the following chemotherapy and/or immunotherapy agents Durvalumab (IMFINZI), Gemcitabine (GEMZAR) & Cisplatin (PLATINOL).      To help prevent nausea and vomiting after your treatment, we encourage you to take your nausea medication as directed.  BELOW ARE SYMPTOMS THAT SHOULD BE REPORTED IMMEDIATELY: *FEVER GREATER THAN 100.4 F (38 C) OR HIGHER *CHILLS OR SWEATING *NAUSEA AND VOMITING THAT IS NOT CONTROLLED WITH YOUR NAUSEA MEDICATION *UNUSUAL SHORTNESS OF BREATH *UNUSUAL BRUISING OR BLEEDING *URINARY PROBLEMS (pain or burning when urinating, or frequent urination) *BOWEL PROBLEMS (unusual diarrhea, constipation, pain near the anus) TENDERNESS IN MOUTH AND THROAT WITH OR WITHOUT PRESENCE OF ULCERS (sore throat, sores in mouth, or a toothache) UNUSUAL RASH, SWELLING OR PAIN  UNUSUAL VAGINAL DISCHARGE OR ITCHING   Items with * indicate a potential emergency and should be followed up as soon as possible or go to the Emergency Department if any problems should occur.  Please show the CHEMOTHERAPY  ALERT CARD or IMMUNOTHERAPY ALERT CARD at check-in to the Emergency Department and triage nurse.  Should you have questions after your visit or need to cancel or reschedule your appointment, please contact [facility]  Dept: [phone]  and follow the prompts.  Office hours are 8:00 a.m. to 4:30 p.m. Monday - Friday. Please note that voicemails left after 4:00 p.m. may not be returned until the following business day.  We are closed weekends and major holidays. You have access to a nurse at all times for urgent questions. Please call the main number to the clinic Dept: [phone] and follow the prompts.   For any non-urgent questions, you may also contact your provider using MyChart. We now offer e-Visits for anyone 13 and older to request care online for non-urgent symptoms. For details visit [email or web address].   Also download the MyChart app! Go to the app store, search "MyChart", open the app, select [facility], and log in with your MyChart username and password.  Due to Covid, a mask is required upon entering the hospital/clinic. If you do not have a mask, one will be given to you upon arrival. For doctor visits, patients may have 1 support person aged 49 or older with them. For treatment visits, patients cannot have anyone with them due to current Covid guidelines and our immunocompromised population.   Durvalumab injection What is this medication? DURVALUMAB (dur VAL ue mab) is a monoclonal antibody. It is used to treat lung cancer. This medicine may be used for other purposes; ask your health care provider or pharmacist if you have questions. COMMON  BRAND NAME(S): IMFINZI What should I tell my care team before I take this medication? They need to know if you have any of these conditions: autoimmune diseases like Crohn's disease, ulcerative colitis, or lupus have had or planning to have an allogeneic stem cell transplant (uses someone else's stem  cells) history of organ transplant history of radiation to the chest nervous system problems like myasthenia gravis or Guillain-Barre syndrome an unusual or allergic reaction to durvalumab, other medicines, foods, dyes, or preservatives pregnant or trying to get pregnant breast-feeding How should I use this medication? This medicine is for infusion into a vein. It is given by a health care professional in a hospital or clinic setting. A special MedGuide will be given to you before each treatment. Be sure to read this information carefully each time. Talk to your pediatrician regarding the use of this medicine in children. Special care may be needed. Overdosage: If you think you have taken too much of this medicine contact a poison control center or emergency room at once. NOTE: This medicine is only for you. Do not share this medicine with others. What if I miss a dose? It is important not to miss your dose. Call your doctor or health care professional if you are unable to keep an appointment. What may interact with this medication? Interactions have not been studied. This list may not describe all possible interactions. Give your health care provider a list of all the medicines, herbs, non-prescription drugs, or dietary supplements you use. Also tell them if you smoke, drink alcohol, or use illegal drugs. Some items may interact with your medicine. What should I watch for while using this medication? This drug may make you feel generally unwell. Continue your course of treatment even though you feel ill unless your doctor tells you to stop. You may need blood work done while you are taking this medicine. Do not become pregnant while taking this medicine or for 3 months after stopping it. Women should inform their doctor if they wish to become pregnant or think they might be pregnant. There is a potential for serious side effects to an unborn child. Talk to your health care professional or  pharmacist for more information. Do not breast-feed an infant while taking this medicine or for 3 months after stopping it. What side effects may I notice from receiving this medication? Side effects that you should report to your doctor or health care professional as soon as possible: allergic reactions like skin rash, itching or hives, swelling of the face, lips, or tongue black, tarry stools bloody or watery diarrhea breathing problems change in emotions or moods change in sex drive changes in vision chest pain or chest tightness chills confusion cough facial flushing fever headache signs and symptoms of high blood sugar such as dizziness; dry mouth; dry skin; fruity breath; nausea; stomach pain; increased hunger or thirst; increased urination signs and symptoms of liver injury like dark yellow or brown urine; general ill feeling or flu-like symptoms; light-colored stools; loss of appetite; nausea; right upper belly pain; unusually weak or tired; yellowing of the eyes or skin stomach pain trouble passing urine or change in the amount of urine weight gain or weight loss Side effects that usually do not require medical attention (report these to your doctor or health care professional if they continue or are bothersome): bone pain constipation loss of appetite muscle pain nausea swelling of the ankles, feet, hands tiredness This list may not describe all possible side effects.  Call your doctor for medical advice about side effects. You may report side effects to FDA at 1-800-FDA-1088. Where should I keep my medication? This drug is given in a hospital or clinic and will not be stored at home. NOTE: This sheet is a summary. It may not cover all possible information. If you have questions about this medicine, talk to your doctor, pharmacist, or health care provider.  2022 Elsevier/Gold Standard (2019-04-01 13:01:29)  Gemcitabine injection What is this medication? GEMCITABINE  (jem SYE ta been) is a chemotherapy drug. This medicine is used to treat many types of cancer like breast cancer, lung cancer, pancreatic cancer, and ovarian cancer. This medicine may be used for other purposes; ask your health care provider or pharmacist if you have questions. COMMON BRAND NAME(S): Gemzar, Infugem What should I tell my care team before I take this medication? They need to know if you have any of these conditions: blood disorders infection kidney disease liver disease lung or breathing disease, like asthma recent or ongoing radiation therapy an unusual or allergic reaction to gemcitabine, other chemotherapy, other medicines, foods, dyes, or preservatives pregnant or trying to get pregnant breast-feeding How should I use this medication? This drug is given as an infusion into a vein. It is administered in a hospital or clinic by a specially trained health care professional. Talk to your pediatrician regarding the use of this medicine in children. Special care may be needed. Overdosage: If you think you have taken too much of this medicine contact a poison control center or emergency room at once. NOTE: This medicine is only for you. Do not share this medicine with others. What if I miss a dose? It is important not to miss your dose. Call your doctor or health care professional if you are unable to keep an appointment. What may interact with this medication? medicines to increase blood counts like filgrastim, pegfilgrastim, sargramostim some other chemotherapy drugs like cisplatin vaccines Talk to your doctor or health care professional before taking any of these medicines: acetaminophen aspirin ibuprofen ketoprofen naproxen This list may not describe all possible interactions. Give your health care provider a list of all the medicines, herbs, non-prescription drugs, or dietary supplements you use. Also tell them if you smoke, drink alcohol, or use illegal drugs. Some  items may interact with your medicine. What should I watch for while using this medication? Visit your doctor for checks on your progress. This drug may make you feel generally unwell. This is not uncommon, as chemotherapy can affect healthy cells as well as cancer cells. Report any side effects. Continue your course of treatment even though you feel ill unless your doctor tells you to stop. In some cases, you may be given additional medicines to help with side effects. Follow all directions for their use. Call your doctor or health care professional for advice if you get a fever, chills or sore throat, or other symptoms of a cold or flu. Do not treat yourself. This drug decreases your body's ability to fight infections. Try to avoid being around people who are sick. This medicine may increase your risk to bruise or bleed. Call your doctor or health care professional if you notice any unusual bleeding. Be careful brushing and flossing your teeth or using a toothpick because you may get an infection or bleed more easily. If you have any dental work done, tell your dentist you are receiving this medicine. Avoid taking products that contain aspirin, acetaminophen, ibuprofen, naproxen, or ketoprofen unless  instructed by your doctor. These medicines may hide a fever. Do not become pregnant while taking this medicine or for 6 months after stopping it. Women should inform their doctor if they wish to become pregnant or think they might be pregnant. Men should not father a child while taking this medicine and for 3 months after stopping it. There is a potential for serious side effects to an unborn child. Talk to your health care professional or pharmacist for more information. Do not breast-feed an infant while taking this medicine or for at least 1 week after stopping it. Men should inform their doctors if they wish to father a child. This medicine may lower sperm counts. Talk with your doctor or health care  professional if you are concerned about your fertility. What side effects may I notice from receiving this medication? Side effects that you should report to your doctor or health care professional as soon as possible: allergic reactions like skin rash, itching or hives, swelling of the face, lips, or tongue breathing problems pain, redness, or irritation at site where injected signs and symptoms of a dangerous change in heartbeat or heart rhythm like chest pain; dizziness; fast or irregular heartbeat; palpitations; feeling faint or lightheaded, falls; breathing problems signs of decreased platelets or bleeding - bruising, pinpoint red spots on the skin, black, tarry stools, blood in the urine signs of decreased red blood cells - unusually weak or tired, feeling faint or lightheaded, falls signs of infection - fever or chills, cough, sore throat, pain or difficulty passing urine signs and symptoms of kidney injury like trouble passing urine or change in the amount of urine signs and symptoms of liver injury like dark yellow or brown urine; general ill feeling or flu-like symptoms; light-colored stools; loss of appetite; nausea; right upper belly pain; unusually weak or tired; yellowing of the eyes or skin swelling of ankles, feet, hands Side effects that usually do not require medical attention (report to your doctor or health care professional if they continue or are bothersome): constipation diarrhea hair loss loss of appetite nausea rash vomiting This list may not describe all possible side effects. Call your doctor for medical advice about side effects. You may report side effects to FDA at 1-800-FDA-1088. Where should I keep my medication? This drug is given in a hospital or clinic and will not be stored at home. NOTE: This sheet is a summary. It may not cover all possible information. If you have questions about this medicine, talk to your doctor, pharmacist, or health care provider.   2022 Elsevier/Gold Standard (2017-04-16 18:06:11)  Cisplatin injection What is this medication? CISPLATIN (SIS pla tin) is a chemotherapy drug. It targets fast dividing cells, like cancer cells, and causes these cells to die. This medicine is used to treat many types of cancer like bladder, ovarian, and testicular cancers. This medicine may be used for other purposes; ask your health care provider or pharmacist if you have questions. COMMON BRAND NAME(S): Platinol, Platinol -AQ What should I tell my care team before I take this medication? They need to know if you have any of these conditions: eye disease, vision problems hearing problems kidney disease low blood counts, like white cells, platelets, or red blood cells tingling of the fingers or toes, or other nerve disorder an unusual or allergic reaction to cisplatin, carboplatin, oxaliplatin, other medicines, foods, dyes, or preservatives pregnant or trying to get pregnant breast-feeding How should I use this medication? This drug is given as an  infusion into a vein. It is administered in a hospital or clinic by a specially trained health care professional. Talk to your pediatrician regarding the use of this medicine in children. Special care may be needed. Overdosage: If you think you have taken too much of this medicine contact a poison control center or emergency room at once. NOTE: This medicine is only for you. Do not share this medicine with others. What if I miss a dose? It is important not to miss a dose. Call your doctor or health care professional if you are unable to keep an appointment. What may interact with this medication? This medicine may interact with the following medications: foscarnet certain antibiotics like amikacin, gentamicin, neomycin, polymyxin B, streptomycin, tobramycin, vancomycin This list may not describe all possible interactions. Give your health care provider a list of all the medicines, herbs,  non-prescription drugs, or dietary supplements you use. Also tell them if you smoke, drink alcohol, or use illegal drugs. Some items may interact with your medicine. What should I watch for while using this medication? Your condition will be monitored carefully while you are receiving this medicine. You will need important blood work done while you are taking this medicine. This drug may make you feel generally unwell. This is not uncommon, as chemotherapy can affect healthy cells as well as cancer cells. Report any side effects. Continue your course of treatment even though you feel ill unless your doctor tells you to stop. This medicine may increase your risk of getting an infection. Call your healthcare professional for advice if you get a fever, chills, or sore throat, or other symptoms of a cold or flu. Do not treat yourself. Try to avoid being around people who are sick. Avoid taking medicines that contain aspirin, acetaminophen, ibuprofen, naproxen, or ketoprofen unless instructed by your healthcare professional. These medicines may hide a fever. This medicine may increase your risk to bruise or bleed. Call your doctor or health care professional if you notice any unusual bleeding. Be careful brushing and flossing your teeth or using a toothpick because you may get an infection or bleed more easily. If you have any dental work done, tell your dentist you are receiving this medicine. Do not become pregnant while taking this medicine or for 14 months after stopping it. Women should inform their healthcare professional if they wish to become pregnant or think they might be pregnant. Men should not father a child while taking this medicine and for 11 months after stopping it. There is potential for serious side effects to an unborn child. Talk to your healthcare professional for more information. Do not breast-feed an infant while taking this medicine. This medicine has caused ovarian failure in some  women. This medicine may make it more difficult to get pregnant. Talk to your healthcare professional if you are concerned about your fertility. This medicine has caused decreased sperm counts in some men. This may make it more difficult to father a child. Talk to your healthcare professional if you are concerned about your fertility. Drink fluids as directed while you are taking this medicine. This will help protect your kidneys. Call your doctor or health care professional if you get diarrhea. Do not treat yourself. What side effects may I notice from receiving this medication? Side effects that you should report to your doctor or health care professional as soon as possible: allergic reactions like skin rash, itching or hives, swelling of the face, lips, or tongue blurred vision changes in vision  decreased hearing or ringing of the ears nausea, vomiting pain, redness, or irritation at site where injected pain, tingling, numbness in the hands or feet signs and symptoms of bleeding such as bloody or black, tarry stools; red or dark brown urine; spitting up blood or brown material that looks like coffee grounds; red spots on the skin; unusual bruising or bleeding from the eyes, gums, or nose signs and symptoms of infection like fever; chills; cough; sore throat; pain or trouble passing urine signs and symptoms of kidney injury like trouble passing urine or change in the amount of urine signs and symptoms of low red blood cells or anemia such as unusually weak or tired; feeling faint or lightheaded; falls; breathing problems Side effects that usually do not require medical attention (report to your doctor or health care professional if they continue or are bothersome): loss of appetite mouth sores muscle cramps This list may not describe all possible side effects. Call your doctor for medical advice about side effects. You may report side effects to FDA at 1-800-FDA-1088. Where should I keep my  medication? This drug is given in a hospital or clinic and will not be stored at home. NOTE: This sheet is a summary. It may not cover all possible information. If you have questions about this medicine, talk to your doctor, pharmacist, or health care provider.  2022 Elsevier/Gold Standard (2018-01-16 15:59:17)

## 2020-10-24 ENCOUNTER — Other Ambulatory Visit (HOSPITAL_BASED_OUTPATIENT_CLINIC_OR_DEPARTMENT_OTHER): Payer: Self-pay

## 2020-10-24 MED ORDER — AMOXICILLIN 500 MG PO CAPS
500.0000 mg | ORAL_CAPSULE | ORAL | 0 refills | Status: DC
  Filled 2020-10-24: qty 21, 7d supply, fill #0

## 2020-10-25 ENCOUNTER — Encounter: Payer: Self-pay | Admitting: *Deleted

## 2020-10-25 NOTE — Progress Notes (Unsigned)
Faxed office note, treatment plan, med list to [employer] [facility] [phone] att: Wilhelmina Mcardle (Case Management).

## 2020-10-30 ENCOUNTER — Inpatient Hospital Stay: Payer: No Typology Code available for payment source

## 2020-10-30 ENCOUNTER — Other Ambulatory Visit: Payer: Self-pay

## 2020-10-30 VITALS — BP 157/98 | HR 70 | Temp 98.7°F | Resp 20 | Ht 74.0 in | Wt 239.0 lb

## 2020-10-30 DIAGNOSIS — C221 Intrahepatic bile duct carcinoma: Secondary | ICD-10-CM

## 2020-10-30 DIAGNOSIS — Z5112 Encounter for antineoplastic immunotherapy: Secondary | ICD-10-CM | POA: Diagnosis not present

## 2020-10-30 LAB — CBC WITH DIFFERENTIAL (CANCER CENTER ONLY)
Abs Immature Granulocytes: 0 10*3/uL (ref 0.00–0.07)
Basophils Absolute: 0.1 10*3/uL (ref 0.0–0.1)
Basophils Relative: 2 %
Eosinophils Absolute: 0.1 10*3/uL (ref 0.0–0.5)
Eosinophils Relative: 2 %
HCT: 28 % — ABNORMAL LOW (ref 39.0–52.0)
Hemoglobin: 9.3 g/dL — ABNORMAL LOW (ref 13.0–17.0)
Immature Granulocytes: 0 %
Lymphocytes Relative: 31 %
Lymphs Abs: 0.9 10*3/uL (ref 0.7–4.0)
MCH: 34.2 pg — ABNORMAL HIGH (ref 26.0–34.0)
MCHC: 33.2 g/dL (ref 30.0–36.0)
MCV: 102.9 fL — ABNORMAL HIGH (ref 80.0–100.0)
Monocytes Absolute: 0.3 10*3/uL (ref 0.1–1.0)
Monocytes Relative: 10 %
Neutro Abs: 1.6 10*3/uL — ABNORMAL LOW (ref 1.7–7.7)
Neutrophils Relative %: 55 %
Platelet Count: 116 10*3/uL — ABNORMAL LOW (ref 150–400)
RBC: 2.72 MIL/uL — ABNORMAL LOW (ref 4.22–5.81)
RDW: 15.8 % — ABNORMAL HIGH (ref 11.5–15.5)
WBC Count: 2.8 10*3/uL — ABNORMAL LOW (ref 4.0–10.5)
nRBC: 0 % (ref 0.0–0.2)

## 2020-10-30 LAB — CMP (CANCER CENTER ONLY)
ALT: 28 U/L (ref 0–44)
AST: 34 U/L (ref 15–41)
Albumin: 3.9 g/dL (ref 3.5–5.0)
Alkaline Phosphatase: 120 U/L (ref 38–126)
Anion gap: 5 (ref 5–15)
BUN: 22 mg/dL — ABNORMAL HIGH (ref 6–20)
CO2: 28 mmol/L (ref 22–32)
Calcium: 9.3 mg/dL (ref 8.9–10.3)
Chloride: 103 mmol/L (ref 98–111)
Creatinine: 0.84 mg/dL (ref 0.61–1.24)
GFR, Estimated: >60 mL/min (ref >60)
Glucose, Bld: 121 mg/dL — ABNORMAL HIGH (ref 70–99)
Potassium: 4.2 mmol/L (ref 3.5–5.1)
Sodium: 136 mmol/L (ref 135–145)
Total Bilirubin: 0.5 mg/dL (ref 0.3–1.2)
Total Protein: 6.6 g/dL (ref 6.5–8.1)

## 2020-10-30 LAB — MAGNESIUM: Magnesium: 1.8 mg/dL (ref 1.7–2.4)

## 2020-10-30 MED ORDER — HEPARIN SOD (PORK) LOCK FLUSH 100 UNIT/ML IV SOLN
500.0000 [IU] | Freq: Once | INTRAVENOUS | Status: AC | PRN
  Administered 2020-10-30: 500 [IU]

## 2020-10-30 MED ORDER — MAGNESIUM SULFATE 2 GM/50ML IV SOLN
2.0000 g | Freq: Once | INTRAVENOUS | Status: AC
  Administered 2020-10-30: 2 g via INTRAVENOUS
  Filled 2020-10-30: qty 50

## 2020-10-30 MED ORDER — POTASSIUM CHLORIDE IN NACL 20-0.9 MEQ/L-% IV SOLN
Freq: Once | INTRAVENOUS | Status: AC
  Filled 2020-10-30: qty 1000

## 2020-10-30 MED ORDER — DEXAMETHASONE SODIUM PHOSPHATE 10 MG/ML IJ SOLN
5.0000 mg | Freq: Once | INTRAMUSCULAR | Status: AC
  Administered 2020-10-30: 5 mg via INTRAVENOUS
  Filled 2020-10-30: qty 1

## 2020-10-30 MED ORDER — PALONOSETRON HCL INJECTION 0.25 MG/5ML
0.2500 mg | Freq: Once | INTRAVENOUS | Status: AC
  Administered 2020-10-30: 0.25 mg via INTRAVENOUS
  Filled 2020-10-30: qty 5

## 2020-10-30 MED ORDER — SODIUM CHLORIDE 0.9 % IV SOLN: Freq: Once | INTRAVENOUS | Status: AC

## 2020-10-30 MED ORDER — SODIUM CHLORIDE 0.9 % IV SOLN
25.0000 mg/m2 | Freq: Once | INTRAVENOUS | Status: AC
  Administered 2020-10-30: 59 mg via INTRAVENOUS
  Filled 2020-10-30: qty 59

## 2020-10-30 MED ORDER — SODIUM CHLORIDE 0.9 % IV SOLN
150.0000 mg | Freq: Once | INTRAVENOUS | Status: AC
  Administered 2020-10-30: 150 mg via INTRAVENOUS
  Filled 2020-10-30: qty 5

## 2020-10-30 MED ORDER — FAMOTIDINE 20 MG IN NS 100 ML IVPB
20.0000 mg | Freq: Once | INTRAVENOUS | Status: AC
  Administered 2020-10-30: 20 mg via INTRAVENOUS
  Filled 2020-10-30: qty 100

## 2020-10-30 MED ORDER — SODIUM CHLORIDE 0.9 % IV SOLN
800.0000 mg/m2 | Freq: Once | INTRAVENOUS | Status: AC
  Administered 2020-10-30: 1862 mg via INTRAVENOUS
  Filled 2020-10-30: qty 48.97

## 2020-10-30 MED ORDER — SODIUM CHLORIDE 0.9% FLUSH
10.0000 mL | INTRAVENOUS | Status: DC | PRN
  Administered 2020-10-30: 10 mL

## 2020-10-30 NOTE — Progress Notes (Signed)
Patient presents for treatment. RN assessment completed along with the following:  Labs/vitals reviewed - Yes, and within treatment parameters.   Weight within 10% of previous measurement - Yes Oncology Treatment Attestation completed for current therapy- Yes, on date 02/01/2020 Informed consent completed and reflects current therapy/intent - Yes, on date 02/04/2020             Provider progress note reviewed - Patient not seen by provider today. Most recent note dated 10/23/2020 reviewed. Treatment/Antibody/Supportive plan reviewed - Yes, and there are no adjustments needed for today's treatment. S&H and other orders reviewed - Yes, and there are no additional orders identified. Previous treatment date reviewed - Yes, and the appropriate amount of time has elapsed between treatments. Clinic Hand Off Received from - No, Patient not seen today by MD.  Patient to proceed with treatment.

## 2020-10-30 NOTE — Patient Instructions (Signed)
[facility]   Discharge Instructions: Thank you for choosing [facility] to provide your oncology and hematology care.   If you have a lab appointment with the [facility], please go directly to the [facility] and check in at the registration area.   Wear comfortable clothing and clothing appropriate for easy access to any Portacath or PICC line.   We strive to give you quality time with your provider. You may need to reschedule your appointment if you arrive late (15 or more minutes).  Arriving late affects you and other patients whose appointments are after yours.  Also, if you miss three or more appointments without notifying the office, you may be dismissed from the clinic at the provider's discretion.      For prescription refill requests, have your pharmacy contact our office and allow 72 hours for refills to be completed.    Today you received the following chemotherapy and/or immunotherapy agents Gemcitabine (GEMZAR) & Cisplatin (PLATINOL).      To help prevent nausea and vomiting after your treatment, we encourage you to take your nausea medication as directed.  BELOW ARE SYMPTOMS THAT SHOULD BE REPORTED IMMEDIATELY: *FEVER GREATER THAN 100.4 F (38 C) OR HIGHER *CHILLS OR SWEATING *NAUSEA AND VOMITING THAT IS NOT CONTROLLED WITH YOUR NAUSEA MEDICATION *UNUSUAL SHORTNESS OF BREATH *UNUSUAL BRUISING OR BLEEDING *URINARY PROBLEMS (pain or burning when urinating, or frequent urination) *BOWEL PROBLEMS (unusual diarrhea, constipation, pain near the anus) TENDERNESS IN MOUTH AND THROAT WITH OR WITHOUT PRESENCE OF ULCERS (sore throat, sores in mouth, or a toothache) UNUSUAL RASH, SWELLING OR PAIN  UNUSUAL VAGINAL DISCHARGE OR ITCHING   Items with * indicate a potential emergency and should be followed up as soon as possible or go to the Emergency Department if any problems should occur.  Please show the CHEMOTHERAPY ALERT CARD or  IMMUNOTHERAPY ALERT CARD at check-in to the Emergency Department and triage nurse.  Should you have questions after your visit or need to cancel or reschedule your appointment, please contact [facility]  Dept: [phone]  and follow the prompts.  Office hours are 8:00 a.m. to 4:30 p.m. Monday - Friday. Please note that voicemails left after 4:00 p.m. may not be returned until the following business day.  We are closed weekends and major holidays. You have access to a nurse at all times for urgent questions. Please call the main number to the clinic Dept: [phone] and follow the prompts.   For any non-urgent questions, you may also contact your provider using MyChart. We now offer e-Visits for anyone 45 and older to request care online for non-urgent symptoms. For details visit [email or web address].   Also download the MyChart app! Go to the app store, search "MyChart", open the app, select [facility], and log in with your MyChart username and password.  Due to Covid, a mask is required upon entering the hospital/clinic. If you do not have a mask, one will be given to you upon arrival. For doctor visits, patients may have 1 support person aged 45 or older with them. For treatment visits, patients cannot have anyone with them due to current Covid guidelines and our immunocompromised population.   Gemcitabine injection What is this medication? GEMCITABINE (jem SYE ta been) is a chemotherapy drug. This medicine is used to treat many types of cancer like breast cancer, lung cancer, pancreatic cancer, and ovarian cancer. This medicine may be used for other purposes; ask  your health care provider or pharmacist if you have questions. COMMON BRAND NAME(S): Gemzar, Infugem What should I tell my care team before I take this medication? They need to know if you have any of these conditions: blood disorders infection kidney disease liver disease lung or breathing  disease, like asthma recent or ongoing radiation therapy an unusual or allergic reaction to gemcitabine, other chemotherapy, other medicines, foods, dyes, or preservatives pregnant or trying to get pregnant breast-feeding How should I use this medication? This drug is given as an infusion into a vein. It is administered in a hospital or clinic by a specially trained health care professional. Talk to your pediatrician regarding the use of this medicine in children. Special care may be needed. Overdosage: If you think you have taken too much of this medicine contact a poison control center or emergency room at once. NOTE: This medicine is only for you. Do not share this medicine with others. What if I miss a dose? It is important not to miss your dose. Call your doctor or health care professional if you are unable to keep an appointment. What may interact with this medication? medicines to increase blood counts like filgrastim, pegfilgrastim, sargramostim some other chemotherapy drugs like cisplatin vaccines Talk to your doctor or health care professional before taking any of these medicines: acetaminophen aspirin ibuprofen ketoprofen naproxen This list may not describe all possible interactions. Give your health care provider a list of all the medicines, herbs, non-prescription drugs, or dietary supplements you use. Also tell them if you smoke, drink alcohol, or use illegal drugs. Some items may interact with your medicine. What should I watch for while using this medication? Visit your doctor for checks on your progress. This drug may make you feel generally unwell. This is not uncommon, as chemotherapy can affect healthy cells as well as cancer cells. Report any side effects. Continue your course of treatment even though you feel ill unless your doctor tells you to stop. In some cases, you may be given additional medicines to help with side effects. Follow all directions for their  use. Call your doctor or health care professional for advice if you get a fever, chills or sore throat, or other symptoms of a cold or flu. Do not treat yourself. This drug decreases your body's ability to fight infections. Try to avoid being around people who are sick. This medicine may increase your risk to bruise or bleed. Call your doctor or health care professional if you notice any unusual bleeding. Be careful brushing and flossing your teeth or using a toothpick because you may get an infection or bleed more easily. If you have any dental work done, tell your dentist you are receiving this medicine. Avoid taking products that contain aspirin, acetaminophen, ibuprofen, naproxen, or ketoprofen unless instructed by your doctor. These medicines may hide a fever. Do not become pregnant while taking this medicine or for 6 months after stopping it. Women should inform their doctor if they wish to become pregnant or think they might be pregnant. Men should not father a child while taking this medicine and for 3 months after stopping it. There is a potential for serious side effects to an unborn child. Talk to your health care professional or pharmacist for more information. Do not breast-feed an infant while taking this medicine or for at least 1 week after stopping it. Men should inform their doctors if they wish to father a child. This medicine may lower sperm counts. Talk with  your doctor or health care professional if you are concerned about your fertility. What side effects may I notice from receiving this medication? Side effects that you should report to your doctor or health care professional as soon as possible: allergic reactions like skin rash, itching or hives, swelling of the face, lips, or tongue breathing problems pain, redness, or irritation at site where injected signs and symptoms of a dangerous change in heartbeat or heart rhythm like chest pain; dizziness; fast or irregular heartbeat;  palpitations; feeling faint or lightheaded, falls; breathing problems signs of decreased platelets or bleeding - bruising, pinpoint red spots on the skin, black, tarry stools, blood in the urine signs of decreased red blood cells - unusually weak or tired, feeling faint or lightheaded, falls signs of infection - fever or chills, cough, sore throat, pain or difficulty passing urine signs and symptoms of kidney injury like trouble passing urine or change in the amount of urine signs and symptoms of liver injury like dark yellow or brown urine; general ill feeling or flu-like symptoms; light-colored stools; loss of appetite; nausea; right upper belly pain; unusually weak or tired; yellowing of the eyes or skin swelling of ankles, feet, hands Side effects that usually do not require medical attention (report to your doctor or health care professional if they continue or are bothersome): constipation diarrhea hair loss loss of appetite nausea rash vomiting This list may not describe all possible side effects. Call your doctor for medical advice about side effects. You may report side effects to FDA at 1-800-FDA-1088. Where should I keep my medication? This drug is given in a hospital or clinic and will not be stored at home. NOTE: This sheet is a summary. It may not cover all possible information. If you have questions about this medicine, talk to your doctor, pharmacist, or health care provider.  2022 Elsevier/Gold Standard (2017-04-16 18:06:11)  Cisplatin injection What is this medication? CISPLATIN (SIS pla tin) is a chemotherapy drug. It targets fast dividing cells, like cancer cells, and causes these cells to die. This medicine is used to treat many types of cancer like bladder, ovarian, and testicular cancers. This medicine may be used for other purposes; ask your health care provider or pharmacist if you have questions. COMMON BRAND NAME(S): Platinol, Platinol -AQ What should I tell my  care team before I take this medication? They need to know if you have any of these conditions: eye disease, vision problems hearing problems kidney disease low blood counts, like white cells, platelets, or red blood cells tingling of the fingers or toes, or other nerve disorder an unusual or allergic reaction to cisplatin, carboplatin, oxaliplatin, other medicines, foods, dyes, or preservatives pregnant or trying to get pregnant breast-feeding How should I use this medication? This drug is given as an infusion into a vein. It is administered in a hospital or clinic by a specially trained health care professional. Talk to your pediatrician regarding the use of this medicine in children. Special care may be needed. Overdosage: If you think you have taken too much of this medicine contact a poison control center or emergency room at once. NOTE: This medicine is only for you. Do not share this medicine with others. What if I miss a dose? It is important not to miss a dose. Call your doctor or health care professional if you are unable to keep an appointment. What may interact with this medication? This medicine may interact with the following medications: foscarnet certain antibiotics like amikacin,  gentamicin, neomycin, polymyxin B, streptomycin, tobramycin, vancomycin This list may not describe all possible interactions. Give your health care provider a list of all the medicines, herbs, non-prescription drugs, or dietary supplements you use. Also tell them if you smoke, drink alcohol, or use illegal drugs. Some items may interact with your medicine. What should I watch for while using this medication? Your condition will be monitored carefully while you are receiving this medicine. You will need important blood work done while you are taking this medicine. This drug may make you feel generally unwell. This is not uncommon, as chemotherapy can affect healthy cells as well as cancer cells.  Report any side effects. Continue your course of treatment even though you feel ill unless your doctor tells you to stop. This medicine may increase your risk of getting an infection. Call your healthcare professional for advice if you get a fever, chills, or sore throat, or other symptoms of a cold or flu. Do not treat yourself. Try to avoid being around people who are sick. Avoid taking medicines that contain aspirin, acetaminophen, ibuprofen, naproxen, or ketoprofen unless instructed by your healthcare professional. These medicines may hide a fever. This medicine may increase your risk to bruise or bleed. Call your doctor or health care professional if you notice any unusual bleeding. Be careful brushing and flossing your teeth or using a toothpick because you may get an infection or bleed more easily. If you have any dental work done, tell your dentist you are receiving this medicine. Do not become pregnant while taking this medicine or for 14 months after stopping it. Women should inform their healthcare professional if they wish to become pregnant or think they might be pregnant. Men should not father a child while taking this medicine and for 11 months after stopping it. There is potential for serious side effects to an unborn child. Talk to your healthcare professional for more information. Do not breast-feed an infant while taking this medicine. This medicine has caused ovarian failure in some women. This medicine may make it more difficult to get pregnant. Talk to your healthcare professional if you are concerned about your fertility. This medicine has caused decreased sperm counts in some men. This may make it more difficult to father a child. Talk to your healthcare professional if you are concerned about your fertility. Drink fluids as directed while you are taking this medicine. This will help protect your kidneys. Call your doctor or health care professional if you get diarrhea. Do not treat  yourself. What side effects may I notice from receiving this medication? Side effects that you should report to your doctor or health care professional as soon as possible: allergic reactions like skin rash, itching or hives, swelling of the face, lips, or tongue blurred vision changes in vision decreased hearing or ringing of the ears nausea, vomiting pain, redness, or irritation at site where injected pain, tingling, numbness in the hands or feet signs and symptoms of bleeding such as bloody or black, tarry stools; red or dark brown urine; spitting up blood or brown material that looks like coffee grounds; red spots on the skin; unusual bruising or bleeding from the eyes, gums, or nose signs and symptoms of infection like fever; chills; cough; sore throat; pain or trouble passing urine signs and symptoms of kidney injury like trouble passing urine or change in the amount of urine signs and symptoms of low red blood cells or anemia such as unusually weak or tired; feeling faint or  lightheaded; falls; breathing problems Side effects that usually do not require medical attention (report to your doctor or health care professional if they continue or are bothersome): loss of appetite mouth sores muscle cramps This list may not describe all possible side effects. Call your doctor for medical advice about side effects. You may report side effects to FDA at 1-800-FDA-1088. Where should I keep my medication? This drug is given in a hospital or clinic and will not be stored at home. NOTE: This sheet is a summary. It may not cover all possible information. If you have questions about this medicine, talk to your doctor, pharmacist, or health care provider.  2022 Elsevier/Gold Standard (2018-01-16 15:59:17)

## 2020-10-31 ENCOUNTER — Inpatient Hospital Stay: Payer: No Typology Code available for payment source

## 2020-10-31 VITALS — BP 145/90 | HR 82 | Temp 98.6°F | Resp 18

## 2020-10-31 DIAGNOSIS — C221 Intrahepatic bile duct carcinoma: Secondary | ICD-10-CM

## 2020-10-31 DIAGNOSIS — Z5112 Encounter for antineoplastic immunotherapy: Secondary | ICD-10-CM | POA: Diagnosis not present

## 2020-10-31 MED ORDER — PEGFILGRASTIM-JMDB 6 MG/0.6ML ~~LOC~~ SOSY
6.0000 mg | PREFILLED_SYRINGE | Freq: Once | SUBCUTANEOUS | Status: AC
  Administered 2020-10-31: 6 mg via SUBCUTANEOUS

## 2020-10-31 NOTE — Patient Instructions (Signed)

## 2020-11-01 ENCOUNTER — Encounter: Payer: Self-pay | Admitting: *Deleted

## 2020-11-01 NOTE — Progress Notes (Signed)
Faxed request from [employer] requesting treatment records, tx plan, lab results and med list for 10/30/20 DOS. Faxed to [phone].

## 2020-11-01 NOTE — Progress Notes (Signed)
..  Patient Assist/Replace for the following has been terminated. Medication: Imfinzi (durvalumab) Reason for Termination: Imfinzi now FDA approved for Diagnosis. PA team obtained Authorization on 10/31/2020. Last DOS: 10/23/2020. Marland KitchenJuan Quam, CPhT IV Drug Replacement Specialist [facility] Phone: [phone]

## 2020-11-02 ENCOUNTER — Other Ambulatory Visit (HOSPITAL_BASED_OUTPATIENT_CLINIC_OR_DEPARTMENT_OTHER): Payer: Self-pay

## 2020-11-02 ENCOUNTER — Other Ambulatory Visit: Payer: Self-pay | Admitting: Family Medicine

## 2020-11-03 ENCOUNTER — Telehealth: Payer: Self-pay

## 2020-11-03 ENCOUNTER — Other Ambulatory Visit (HOSPITAL_BASED_OUTPATIENT_CLINIC_OR_DEPARTMENT_OTHER): Payer: Self-pay

## 2020-11-03 ENCOUNTER — Other Ambulatory Visit: Payer: Self-pay

## 2020-11-03 ENCOUNTER — Other Ambulatory Visit: Payer: Self-pay | Admitting: Family Medicine

## 2020-11-03 MED ORDER — DEXCOM G6 SENSOR MISC
0 refills | Status: DC
  Filled 2020-11-03: qty 9, 90d supply, fill #0

## 2020-11-03 NOTE — Telephone Encounter (Signed)
Pt's spouse called inquiring about a refill of this med Continuous Blood Gluc Sensor ([place]) M for pt. Spouse stated that pharmacy has reached out for a refill several times. Pt needs this med asap. Please advise.  Cb#: [phone]

## 2020-11-10 ENCOUNTER — Ambulatory Visit (HOSPITAL_BASED_OUTPATIENT_CLINIC_OR_DEPARTMENT_OTHER)
Admission: RE | Admit: 2020-11-10 | Discharge: 2020-11-10 | Disposition: A | Discharge status: Home / Self Care | Payer: No Typology Code available for payment source | Source: Ambulatory Visit | Attending: Oncology | Admitting: Oncology

## 2020-11-10 ENCOUNTER — Ambulatory Visit (HOSPITAL_BASED_OUTPATIENT_CLINIC_OR_DEPARTMENT_OTHER): Payer: No Typology Code available for payment source

## 2020-11-10 ENCOUNTER — Inpatient Hospital Stay: Payer: No Typology Code available for payment source | Attending: Oncology

## 2020-11-10 ENCOUNTER — Other Ambulatory Visit: Payer: Self-pay

## 2020-11-10 DIAGNOSIS — D696 Thrombocytopenia, unspecified: Secondary | ICD-10-CM | POA: Insufficient documentation

## 2020-11-10 DIAGNOSIS — C221 Intrahepatic bile duct carcinoma: Secondary | ICD-10-CM | POA: Insufficient documentation

## 2020-11-10 DIAGNOSIS — M545 Low back pain, unspecified: Secondary | ICD-10-CM | POA: Insufficient documentation

## 2020-11-10 DIAGNOSIS — Z95828 Presence of other vascular implants and grafts: Secondary | ICD-10-CM

## 2020-11-10 DIAGNOSIS — R200 Anesthesia of skin: Secondary | ICD-10-CM | POA: Insufficient documentation

## 2020-11-10 DIAGNOSIS — Z5111 Encounter for antineoplastic chemotherapy: Secondary | ICD-10-CM | POA: Insufficient documentation

## 2020-11-10 DIAGNOSIS — R634 Abnormal weight loss: Secondary | ICD-10-CM | POA: Insufficient documentation

## 2020-11-10 DIAGNOSIS — Z8616 Personal history of COVID-19: Secondary | ICD-10-CM | POA: Insufficient documentation

## 2020-11-10 DIAGNOSIS — K5900 Constipation, unspecified: Secondary | ICD-10-CM | POA: Insufficient documentation

## 2020-11-10 DIAGNOSIS — E119 Type 2 diabetes mellitus without complications: Secondary | ICD-10-CM | POA: Insufficient documentation

## 2020-11-10 DIAGNOSIS — Z5112 Encounter for antineoplastic immunotherapy: Secondary | ICD-10-CM | POA: Insufficient documentation

## 2020-11-10 DIAGNOSIS — C787 Secondary malignant neoplasm of liver and intrahepatic bile duct: Secondary | ICD-10-CM | POA: Insufficient documentation

## 2020-11-10 DIAGNOSIS — E785 Hyperlipidemia, unspecified: Secondary | ICD-10-CM | POA: Insufficient documentation

## 2020-11-10 DIAGNOSIS — I10 Essential (primary) hypertension: Secondary | ICD-10-CM | POA: Insufficient documentation

## 2020-11-10 DIAGNOSIS — R630 Anorexia: Secondary | ICD-10-CM | POA: Insufficient documentation

## 2020-11-10 MED ORDER — HEPARIN SOD (PORK) LOCK FLUSH 100 UNIT/ML IV SOLN
500.0000 [IU] | Freq: Once | INTRAVENOUS | Status: AC
  Administered 2020-11-10: 500 [IU] via INTRAVENOUS

## 2020-11-10 MED ORDER — IOHEXOL 350 MG/ML SOLN
100.0000 mL | Freq: Once | INTRAVENOUS | Status: AC | PRN
  Administered 2020-11-10: 85 mL via INTRAVENOUS

## 2020-11-10 MED ORDER — SODIUM CHLORIDE 0.9% FLUSH
10.0000 mL | Freq: Once | INTRAVENOUS | Status: AC
  Administered 2020-11-10: 10 mL via INTRAVENOUS

## 2020-11-10 NOTE — Patient Instructions (Signed)
Implanted Port Home Guide An implanted port is a device that is placed under the skin. It is usually placed in the chest. The device can be used to give IV medicine, to take blood, or for dialysis. You may have an implanted port if: You need IV medicine that would be irritating to the small veins in your hands or arms. You need IV medicines, such as antibiotics, for a long period of time. You need IV nutrition for a long period of time. You need dialysis. When you have a port, your health care provider can choose to use the port instead of veins in your arms for these procedures. You may have fewer limitations when using a port than you would if you used other types of long-term IVs, and you will likely be able to return to normal activities after your incision heals. An implanted port has two main parts: Reservoir. The reservoir is the part where a needle is inserted to give medicines or draw blood. The reservoir is round. After it is placed, it appears as a small, raised area under your skin. Catheter. The catheter is a thin, flexible tube that connects the reservoir to a vein. Medicine that is inserted into the reservoir goes into the catheter and then into the vein. How is my port accessed? To access your port: A numbing cream may be placed on the skin over the port site. Your health care provider will put on a mask and sterile gloves. The skin over your port will be cleaned carefully with a germ-killing soap and allowed to dry. Your health care provider will gently pinch the port and insert a needle into it. Your health care provider will check for a blood return to make sure the port is in the vein and is not clogged. If your port needs to remain accessed to get medicine continuously (constant infusion), your health care provider will place a clear bandage (dressing) over the needle site. The dressing and needle will need to be changed every week, or as told by your health care provider. What  is flushing? Flushing helps keep the port from getting clogged. Follow instructions from your health care provider about how and when to flush the port. Ports are usually flushed with saline solution or a medicine called heparin. The need for flushing will depend on how the port is used: If the port is only used from time to time to give medicines or draw blood, the port may need to be flushed: Before and after medicines have been given. Before and after blood has been drawn. As part of routine maintenance. Flushing may be recommended every 4-6 weeks. If a constant infusion is running, the port may not need to be flushed. Throw away any syringes in a disposal container that is meant for sharp items (sharps container). You can buy a sharps container from a pharmacy, or you can make one by using an empty hard plastic bottle with a cover. How long will my port stay implanted? The port can stay in for as long as your health care provider thinks it is needed. When it is time for the port to come out, a surgery will be done to remove it. The surgery will be similar to the procedure that was done to put the port in. Follow these instructions at home:  Flush your port as told by your health care provider. If you need an infusion over several days, follow instructions from your health care provider about how   to take care of your port site. Make sure you: Wash your hands with soap and water before you change your dressing. If soap and water are not available, use alcohol-based hand sanitizer. Change your dressing as told by your health care provider. Place any used dressings or infusion bags into a plastic bag. Throw that bag in the trash. Keep the dressing that covers the needle clean and dry. Do not get it wet. Do not use scissors or sharp objects near the tube. Keep the tube clamped, unless it is being used. Check your port site every day for signs of infection. Check for: Redness, swelling, or  pain. Fluid or blood. Pus or a bad smell. Protect the skin around the port site. Avoid wearing bra straps that rub or irritate the site. Protect the skin around your port from seat belts. Place a soft pad over your chest if needed. Bathe or shower as told by your health care provider. The site may get wet as long as you are not actively receiving an infusion. Return to your normal activities as told by your health care provider. Ask your health care provider what activities are safe for you. Carry a medical alert card or wear a medical alert bracelet at all times. This will let health care providers know that you have an implanted port in case of an emergency. Get help right away if: You have redness, swelling, or pain at the port site. You have fluid or blood coming from your port site. You have pus or a bad smell coming from the port site. You have a fever. Summary Implanted ports are usually placed in the chest for long-term IV access. Follow instructions from your health care provider about flushing the port and changing bandages (dressings). Take care of the area around your port by avoiding clothing that puts pressure on the area, and by watching for signs of infection. Protect the skin around your port from seat belts. Place a soft pad over your chest if needed. Get help right away if you have a fever or you have redness, swelling, pain, drainage, or a bad smell at the port site. This information is not intended to replace advice given to you by your health care provider. Make sure you discuss any questions you have with your health care provider. Document Revised: 04/12/2020 Document Reviewed: 06/07/2019 Elsevier Patient Education  2022 Elsevier Inc.  

## 2020-11-10 NOTE — Addendum Note (Signed)
Addended by: Teodoro Spray on: 11/10/2020 10:02 AM   Modules accepted: Orders

## 2020-11-12 ENCOUNTER — Other Ambulatory Visit: Payer: Self-pay | Admitting: Oncology

## 2020-11-13 ENCOUNTER — Other Ambulatory Visit: Payer: Self-pay

## 2020-11-13 ENCOUNTER — Other Ambulatory Visit (HOSPITAL_BASED_OUTPATIENT_CLINIC_OR_DEPARTMENT_OTHER): Payer: Self-pay

## 2020-11-13 ENCOUNTER — Telehealth: Payer: Self-pay

## 2020-11-13 ENCOUNTER — Inpatient Hospital Stay: Payer: No Typology Code available for payment source

## 2020-11-13 ENCOUNTER — Inpatient Hospital Stay (HOSPITAL_BASED_OUTPATIENT_CLINIC_OR_DEPARTMENT_OTHER): Payer: No Typology Code available for payment source | Admitting: Oncology

## 2020-11-13 VITALS — BP 126/84 | HR 65 | Temp 98.0°F | Resp 20 | Ht 74.0 in | Wt 244.2 lb

## 2020-11-13 DIAGNOSIS — E119 Type 2 diabetes mellitus without complications: Secondary | ICD-10-CM | POA: Diagnosis not present

## 2020-11-13 DIAGNOSIS — R200 Anesthesia of skin: Secondary | ICD-10-CM | POA: Diagnosis not present

## 2020-11-13 DIAGNOSIS — Z5112 Encounter for antineoplastic immunotherapy: Secondary | ICD-10-CM | POA: Diagnosis not present

## 2020-11-13 DIAGNOSIS — E785 Hyperlipidemia, unspecified: Secondary | ICD-10-CM | POA: Diagnosis not present

## 2020-11-13 DIAGNOSIS — R634 Abnormal weight loss: Secondary | ICD-10-CM | POA: Diagnosis not present

## 2020-11-13 DIAGNOSIS — Z8616 Personal history of COVID-19: Secondary | ICD-10-CM | POA: Diagnosis not present

## 2020-11-13 DIAGNOSIS — M545 Low back pain, unspecified: Secondary | ICD-10-CM | POA: Diagnosis not present

## 2020-11-13 DIAGNOSIS — K5900 Constipation, unspecified: Secondary | ICD-10-CM | POA: Diagnosis not present

## 2020-11-13 DIAGNOSIS — C221 Intrahepatic bile duct carcinoma: Secondary | ICD-10-CM

## 2020-11-13 DIAGNOSIS — R630 Anorexia: Secondary | ICD-10-CM | POA: Diagnosis not present

## 2020-11-13 DIAGNOSIS — C787 Secondary malignant neoplasm of liver and intrahepatic bile duct: Secondary | ICD-10-CM | POA: Diagnosis not present

## 2020-11-13 DIAGNOSIS — Z5111 Encounter for antineoplastic chemotherapy: Secondary | ICD-10-CM | POA: Diagnosis not present

## 2020-11-13 DIAGNOSIS — I10 Essential (primary) hypertension: Secondary | ICD-10-CM | POA: Diagnosis not present

## 2020-11-13 DIAGNOSIS — D696 Thrombocytopenia, unspecified: Secondary | ICD-10-CM | POA: Diagnosis not present

## 2020-11-13 LAB — CMP (CANCER CENTER ONLY)
ALT: 20 U/L (ref 0–44)
AST: 35 U/L (ref 15–41)
Albumin: 3.7 g/dL (ref 3.5–5.0)
Alkaline Phosphatase: 142 U/L — ABNORMAL HIGH (ref 38–126)
Anion gap: 6 (ref 5–15)
BUN: 16 mg/dL (ref 6–20)
CO2: 30 mmol/L (ref 22–32)
Calcium: 9.4 mg/dL (ref 8.9–10.3)
Chloride: 103 mmol/L (ref 98–111)
Creatinine: 0.94 mg/dL (ref 0.61–1.24)
GFR, Estimated: >60 mL/min (ref >60)
Glucose, Bld: 92 mg/dL (ref 70–99)
Potassium: 4.2 mmol/L (ref 3.5–5.1)
Sodium: 139 mmol/L (ref 135–145)
Total Bilirubin: 0.4 mg/dL (ref 0.3–1.2)
Total Protein: 6.7 g/dL (ref 6.5–8.1)

## 2020-11-13 LAB — CBC WITH DIFFERENTIAL (CANCER CENTER ONLY)
Abs Immature Granulocytes: 0.04 10*3/uL (ref 0.00–0.07)
Basophils Absolute: 0.1 10*3/uL (ref 0.0–0.1)
Basophils Relative: 1 %
Eosinophils Absolute: 0.2 10*3/uL (ref 0.0–0.5)
Eosinophils Relative: 3 %
HCT: 28.6 % — ABNORMAL LOW (ref 39.0–52.0)
Hemoglobin: 9.5 g/dL — ABNORMAL LOW (ref 13.0–17.0)
Immature Granulocytes: 1 %
Lymphocytes Relative: 15 %
Lymphs Abs: 1 10*3/uL (ref 0.7–4.0)
MCH: 35.2 pg — ABNORMAL HIGH (ref 26.0–34.0)
MCHC: 33.2 g/dL (ref 30.0–36.0)
MCV: 105.9 fL — ABNORMAL HIGH (ref 80.0–100.0)
Monocytes Absolute: 1 10*3/uL (ref 0.1–1.0)
Monocytes Relative: 14 %
Neutro Abs: 4.8 10*3/uL (ref 1.7–7.7)
Neutrophils Relative %: 66 %
Platelet Count: 184 10*3/uL (ref 150–400)
RBC: 2.7 MIL/uL — ABNORMAL LOW (ref 4.22–5.81)
RDW: 16.5 % — ABNORMAL HIGH (ref 11.5–15.5)
WBC Count: 7.1 10*3/uL (ref 4.0–10.5)
nRBC: 0 % (ref 0.0–0.2)

## 2020-11-13 LAB — MAGNESIUM: Magnesium: 2 mg/dL (ref 1.7–2.4)

## 2020-11-13 MED ORDER — SODIUM CHLORIDE 0.9 % IV SOLN
1500.0000 mg | Freq: Once | INTRAVENOUS | Status: AC
  Administered 2020-11-13: 1500 mg via INTRAVENOUS
  Filled 2020-11-13: qty 30

## 2020-11-13 MED ORDER — HEPARIN SOD (PORK) LOCK FLUSH 100 UNIT/ML IV SOLN
500.0000 [IU] | Freq: Once | INTRAVENOUS | Status: AC | PRN
Start: 2020-11-13 — End: 2020-11-13
  Administered 2020-11-13: 500 [IU]

## 2020-11-13 MED ORDER — LIDOCAINE-PRILOCAINE 2.5-2.5 % EX CREA
TOPICAL_CREAM | CUTANEOUS | 2 refills | Status: DC
  Filled 2020-11-13: qty 30, 10d supply, fill #0

## 2020-11-13 MED ORDER — SODIUM CHLORIDE 0.9 % IV SOLN
150.0000 mg | Freq: Once | INTRAVENOUS | Status: AC
  Administered 2020-11-13: 150 mg via INTRAVENOUS
  Filled 2020-11-13: qty 150

## 2020-11-13 MED ORDER — MAGNESIUM SULFATE 2 GM/50ML IV SOLN
2.0000 g | Freq: Once | INTRAVENOUS | Status: AC
  Administered 2020-11-13: 2 g via INTRAVENOUS
  Filled 2020-11-13: qty 50

## 2020-11-13 MED ORDER — DEXAMETHASONE SODIUM PHOSPHATE 10 MG/ML IJ SOLN
5.0000 mg | Freq: Once | INTRAMUSCULAR | Status: AC
  Administered 2020-11-13: 5 mg via INTRAVENOUS
  Filled 2020-11-13: qty 1

## 2020-11-13 MED ORDER — FAMOTIDINE 20 MG IN NS 100 ML IVPB
20.0000 mg | Freq: Once | INTRAVENOUS | Status: AC
  Administered 2020-11-13: 20 mg via INTRAVENOUS
  Filled 2020-11-13: qty 100

## 2020-11-13 MED ORDER — SODIUM CHLORIDE 0.9 % IV SOLN
800.0000 mg/m2 | Freq: Once | INTRAVENOUS | Status: AC
  Administered 2020-11-13: 1862 mg via INTRAVENOUS
  Filled 2020-11-13: qty 48.97

## 2020-11-13 MED ORDER — PALONOSETRON HCL INJECTION 0.25 MG/5ML
0.2500 mg | Freq: Once | INTRAVENOUS | Status: AC
  Administered 2020-11-13: 0.25 mg via INTRAVENOUS
  Filled 2020-11-13: qty 5

## 2020-11-13 MED ORDER — LORAZEPAM 0.5 MG PO TABS
0.5000 mg | ORAL_TABLET | Freq: Every evening | ORAL | 0 refills | Status: DC | PRN
  Filled 2020-11-13: qty 30, 30d supply, fill #0

## 2020-11-13 MED ORDER — SODIUM CHLORIDE 0.9 % IV SOLN
25.0000 mg/m2 | Freq: Once | INTRAVENOUS | Status: AC
  Administered 2020-11-13: 59 mg via INTRAVENOUS
  Filled 2020-11-13: qty 59

## 2020-11-13 MED ORDER — SODIUM CHLORIDE 0.9 % IV SOLN: Freq: Once | INTRAVENOUS | Status: AC

## 2020-11-13 MED ORDER — SODIUM CHLORIDE 0.9% FLUSH
10.0000 mL | INTRAVENOUS | Status: DC | PRN
  Administered 2020-11-13: 10 mL

## 2020-11-13 MED ORDER — POTASSIUM CHLORIDE IN NACL 20-0.9 MEQ/L-% IV SOLN
Freq: Once | INTRAVENOUS | Status: AC
  Filled 2020-11-13: qty 1000

## 2020-11-13 NOTE — Telephone Encounter (Signed)
Patient seen by Dr. Sherrill today ? ?Vitals are within treatment parameters. ? ?Labs reviewed by Dr. Sherrill and are within treatment parameters. ? ?Per physician team, patient is ready for treatment and there are NO modifications to the treatment plan.  ?

## 2020-11-13 NOTE — Patient Instructions (Addendum)
Matthew Osborne   Discharge Instructions: Thank you for choosing [facility] to provide your oncology and hematology care.   If you have a lab appointment with the [facility], please go directly to the [facility] and check in at the registration area.   Wear comfortable clothing and clothing appropriate for easy access to any Portacath or PICC line.   We strive to give you quality time with your provider. You may need to reschedule your appointment if you arrive late (15 or more minutes).  Arriving late affects you and other patients whose appointments are after yours.  Also, if you miss three or more appointments without notifying the office, you may be dismissed from the clinic at the provider's discretion.      For prescription refill requests, have your pharmacy contact our office and allow 72 hours for refills to be completed.    Today you received the following chemotherapy and/or immunotherapy agents Durvalumab (IMFINZI), Gemcitabine (GEMZAR) & Cisplatin (PLATINOL).      To help prevent nausea and vomiting after your treatment, we encourage you to take your nausea medication as directed.  BELOW ARE SYMPTOMS THAT SHOULD BE REPORTED IMMEDIATELY: *FEVER GREATER THAN 100.4 F (38 C) OR HIGHER *CHILLS OR SWEATING *NAUSEA AND VOMITING THAT IS NOT CONTROLLED WITH YOUR NAUSEA MEDICATION *UNUSUAL SHORTNESS OF BREATH *UNUSUAL BRUISING OR BLEEDING *URINARY PROBLEMS (pain or burning when urinating, or frequent urination) *BOWEL PROBLEMS (unusual diarrhea, constipation, pain near the anus) TENDERNESS IN MOUTH AND THROAT WITH OR WITHOUT PRESENCE OF ULCERS (sore throat, sores in mouth, or a toothache) UNUSUAL RASH, SWELLING OR PAIN  UNUSUAL VAGINAL DISCHARGE OR ITCHING   Items with * indicate a potential emergency and should be followed up as soon as possible or go to the Emergency Department if any problems should occur.  Please show the CHEMOTHERAPY  ALERT CARD or IMMUNOTHERAPY ALERT CARD at check-in to the Emergency Department and triage nurse.  Should you have questions after your visit or need to cancel or reschedule your appointment, please contact [facility]  Dept: [phone]  and follow the prompts.  Office hours are 8:00 a.m. to 4:30 p.m. Monday - Friday. Please note that voicemails left after 4:00 p.m. may not be returned until the following business day.  We are closed weekends and major holidays. You have access to a nurse at all times for urgent questions. Please call the main number to the clinic Dept: [phone] and follow the prompts.   For any non-urgent questions, you may also contact your provider using MyChart. We now offer e-Visits for anyone 5 and older to request care online for non-urgent symptoms. For details visit [email or web address].   Also download the MyChart app! Go to the app store, search "MyChart", open the app, select [facility], and log in with your MyChart username and password.  Due to Covid, a mask is required upon entering the hospital/clinic. If you do not have a mask, one will be given to you upon arrival. For doctor visits, patients may have 1 support person aged 28 or older with them. For treatment visits, patients cannot have anyone with them due to current Covid guidelines and our immunocompromised population.   Gemcitabine injection What is this medication? GEMCITABINE (jem SYE ta been) is a chemotherapy drug. This medicine is used to treat many types of cancer like breast cancer, lung cancer, pancreatic cancer, and ovarian cancer. This medicine may be used for other  purposes; ask your health care provider or pharmacist if you have questions. COMMON BRAND NAME(S): Gemzar, Infugem What should I tell my care team before I take this medication? They need to know if you have any of these conditions: blood disorders infection kidney disease liver disease lung or  breathing disease, like asthma recent or ongoing radiation therapy an unusual or allergic reaction to gemcitabine, other chemotherapy, other medicines, foods, dyes, or preservatives pregnant or trying to get pregnant breast-feeding How should I use this medication? This drug is given as an infusion into a vein. It is administered in a hospital or clinic by a specially trained health care professional. Talk to your pediatrician regarding the use of this medicine in children. Special care may be needed. Overdosage: If you think you have taken too much of this medicine contact a poison control center or emergency room at once. NOTE: This medicine is only for you. Do not share this medicine with others. What if I miss a dose? It is important not to miss your dose. Call your doctor or health care professional if you are unable to keep an appointment. What may interact with this medication? medicines to increase blood counts like filgrastim, pegfilgrastim, sargramostim some other chemotherapy drugs like cisplatin vaccines Talk to your doctor or health care professional before taking any of these medicines: acetaminophen aspirin ibuprofen ketoprofen naproxen This list may not describe all possible interactions. Give your health care provider a list of all the medicines, herbs, non-prescription drugs, or dietary supplements you use. Also tell them if you smoke, drink alcohol, or use illegal drugs. Some items may interact with your medicine. What should I watch for while using this medication? Visit your doctor for checks on your progress. This drug may make you feel generally unwell. This is not uncommon, as chemotherapy can affect healthy cells as well as cancer cells. Report any side effects. Continue your course of treatment even though you feel ill unless your doctor tells you to stop. In some cases, you may be given additional medicines to help with side effects. Follow all directions for their  use. Call your doctor or health care professional for advice if you get a fever, chills or sore throat, or other symptoms of a cold or flu. Do not treat yourself. This drug decreases your body's ability to fight infections. Try to avoid being around people who are sick. This medicine may increase your risk to bruise or bleed. Call your doctor or health care professional if you notice any unusual bleeding. Be careful brushing and flossing your teeth or using a toothpick because you may get an infection or bleed more easily. If you have any dental work done, tell your dentist you are receiving this medicine. Avoid taking products that contain aspirin, acetaminophen, ibuprofen, naproxen, or ketoprofen unless instructed by your doctor. These medicines may hide a fever. Do not become pregnant while taking this medicine or for 6 months after stopping it. Women should inform their doctor if they wish to become pregnant or think they might be pregnant. Men should not father a child while taking this medicine and for 3 months after stopping it. There is a potential for serious side effects to an unborn child. Talk to your health care professional or pharmacist for more information. Do not breast-feed an infant while taking this medicine or for at least 1 week after stopping it. Men should inform their doctors if they wish to father a child. This medicine may lower sperm counts.  Talk with your doctor or health care professional if you are concerned about your fertility. What side effects may I notice from receiving this medication? Side effects that you should report to your doctor or health care professional as soon as possible: allergic reactions like skin rash, itching or hives, swelling of the face, lips, or tongue breathing problems pain, redness, or irritation at site where injected signs and symptoms of a dangerous change in heartbeat or heart rhythm like chest pain; dizziness; fast or irregular heartbeat;  palpitations; feeling faint or lightheaded, falls; breathing problems signs of decreased platelets or bleeding - bruising, pinpoint red spots on the skin, black, tarry stools, blood in the urine signs of decreased red blood cells - unusually weak or tired, feeling faint or lightheaded, falls signs of infection - fever or chills, cough, sore throat, pain or difficulty passing urine signs and symptoms of kidney injury like trouble passing urine or change in the amount of urine signs and symptoms of liver injury like dark yellow or brown urine; general ill feeling or flu-like symptoms; light-colored stools; loss of appetite; nausea; right upper belly pain; unusually weak or tired; yellowing of the eyes or skin swelling of ankles, feet, hands Side effects that usually do not require medical attention (report to your doctor or health care professional if they continue or are bothersome): constipation diarrhea hair loss loss of appetite nausea rash vomiting This list may not describe all possible side effects. Call your doctor for medical advice about side effects. You may report side effects to FDA at 1-800-FDA-1088. Where should I keep my medication? This drug is given in a hospital or clinic and will not be stored at home. NOTE: This sheet is a summary. It may not cover all possible information. If you have questions about this medicine, talk to your doctor, pharmacist, or health care provider.  2022 Elsevier/Gold Standard (2017-04-16 18:06:11)  Cisplatin injection What is this medication? CISPLATIN (SIS pla tin) is a chemotherapy drug. It targets fast dividing cells, like cancer cells, and causes these cells to die. This medicine is used to treat many types of cancer like bladder, ovarian, and testicular cancers. This medicine may be used for other purposes; ask your health care provider or pharmacist if you have questions. COMMON BRAND NAME(S): Platinol, Platinol -AQ What should I tell my  care team before I take this medication? They need to know if you have any of these conditions: eye disease, vision problems hearing problems kidney disease low blood counts, like white cells, platelets, or red blood cells tingling of the fingers or toes, or other nerve disorder an unusual or allergic reaction to cisplatin, carboplatin, oxaliplatin, other medicines, foods, dyes, or preservatives pregnant or trying to get pregnant breast-feeding How should I use this medication? This drug is given as an infusion into a vein. It is administered in a hospital or clinic by a specially trained health care professional. Talk to your pediatrician regarding the use of this medicine in children. Special care may be needed. Overdosage: If you think you have taken too much of this medicine contact a poison control center or emergency room at once. NOTE: This medicine is only for you. Do not share this medicine with others. What if I miss a dose? It is important not to miss a dose. Call your doctor or health care professional if you are unable to keep an appointment. What may interact with this medication? This medicine may interact with the following medications: foscarnet certain antibiotics  like amikacin, gentamicin, neomycin, polymyxin B, streptomycin, tobramycin, vancomycin This list may not describe all possible interactions. Give your health care provider a list of all the medicines, herbs, non-prescription drugs, or dietary supplements you use. Also tell them if you smoke, drink alcohol, or use illegal drugs. Some items may interact with your medicine. What should I watch for while using this medication? Your condition will be monitored carefully while you are receiving this medicine. You will need important blood work done while you are taking this medicine. This drug may make you feel generally unwell. This is not uncommon, as chemotherapy can affect healthy cells as well as cancer cells.  Report any side effects. Continue your course of treatment even though you feel ill unless your doctor tells you to stop. This medicine may increase your risk of getting an infection. Call your healthcare professional for advice if you get a fever, chills, or sore throat, or other symptoms of a cold or flu. Do not treat yourself. Try to avoid being around people who are sick. Avoid taking medicines that contain aspirin, acetaminophen, ibuprofen, naproxen, or ketoprofen unless instructed by your healthcare professional. These medicines may hide a fever. This medicine may increase your risk to bruise or bleed. Call your doctor or health care professional if you notice any unusual bleeding. Be careful brushing and flossing your teeth or using a toothpick because you may get an infection or bleed more easily. If you have any dental work done, tell your dentist you are receiving this medicine. Do not become pregnant while taking this medicine or for 14 months after stopping it. Women should inform their healthcare professional if they wish to become pregnant or think they might be pregnant. Men should not father a child while taking this medicine and for 11 months after stopping it. There is potential for serious side effects to an unborn child. Talk to your healthcare professional for more information. Do not breast-feed an infant while taking this medicine. This medicine has caused ovarian failure in some women. This medicine may make it more difficult to get pregnant. Talk to your healthcare professional if you are concerned about your fertility. This medicine has caused decreased sperm counts in some men. This may make it more difficult to father a child. Talk to your healthcare professional if you are concerned about your fertility. Drink fluids as directed while you are taking this medicine. This will help protect your kidneys. Call your doctor or health care professional if you get diarrhea. Do not treat  yourself. What side effects may I notice from receiving this medication? Side effects that you should report to your doctor or health care professional as soon as possible: allergic reactions like skin rash, itching or hives, swelling of the face, lips, or tongue blurred vision changes in vision decreased hearing or ringing of the ears nausea, vomiting pain, redness, or irritation at site where injected pain, tingling, numbness in the hands or feet signs and symptoms of bleeding such as bloody or black, tarry stools; red or dark brown urine; spitting up blood or brown material that looks like coffee grounds; red spots on the skin; unusual bruising or bleeding from the eyes, gums, or nose signs and symptoms of infection like fever; chills; cough; sore throat; pain or trouble passing urine signs and symptoms of kidney injury like trouble passing urine or change in the amount of urine signs and symptoms of low red blood cells or anemia such as unusually weak or tired; feeling  faint or lightheaded; falls; breathing problems Side effects that usually do not require medical attention (report to your doctor or health care professional if they continue or are bothersome): loss of appetite mouth sores muscle cramps This list may not describe all possible side effects. Call your doctor for medical advice about side effects. You may report side effects to FDA at 1-800-FDA-1088. Where should I keep my medication? This drug is given in a hospital or clinic and will not be stored at home. NOTE: This sheet is a summary. It may not cover all possible information. If you have questions about this medicine, talk to your doctor, pharmacist, or health care provider.  2022 Elsevier/Gold Standard (2018-01-16 15:59:17)  Durvalumab injection What is this medication? DURVALUMAB (dur VAL ue mab) is a monoclonal antibody. It is used to treat lung cancer. This medicine may be used for other purposes; ask your health  care provider or pharmacist if you have questions. COMMON BRAND NAME(S): IMFINZI What should I tell my care team before I take this medication? They need to know if you have any of these conditions: autoimmune diseases like Crohn's disease, ulcerative colitis, or lupus have had or planning to have an allogeneic stem cell transplant (uses someone else's stem cells) history of organ transplant history of radiation to the chest nervous system problems like myasthenia gravis or Guillain-Barre syndrome an unusual or allergic reaction to durvalumab, other medicines, foods, dyes, or preservatives pregnant or trying to get pregnant breast-feeding How should I use this medication? This medicine is for infusion into a vein. It is given by a health care professional in a hospital or clinic setting. A special MedGuide will be given to you before each treatment. Be sure to read this information carefully each time. Talk to your pediatrician regarding the use of this medicine in children. Special care may be needed. Overdosage: If you think you have taken too much of this medicine contact a poison control center or emergency room at once. NOTE: This medicine is only for you. Do not share this medicine with others. What if I miss a dose? It is important not to miss your dose. Call your doctor or health care professional if you are unable to keep an appointment. What may interact with this medication? Interactions have not been studied. This list may not describe all possible interactions. Give your health care provider a list of all the medicines, herbs, non-prescription drugs, or dietary supplements you use. Also tell them if you smoke, drink alcohol, or use illegal drugs. Some items may interact with your medicine. What should I watch for while using this medication? This drug may make you feel generally unwell. Continue your course of treatment even though you feel ill unless your doctor tells you to  stop. You may need blood work done while you are taking this medicine. Do not become pregnant while taking this medicine or for 3 months after stopping it. Women should inform their doctor if they wish to become pregnant or think they might be pregnant. There is a potential for serious side effects to an unborn child. Talk to your health care professional or pharmacist for more information. Do not breast-feed an infant while taking this medicine or for 3 months after stopping it. What side effects may I notice from receiving this medication? Side effects that you should report to your doctor or health care professional as soon as possible: allergic reactions like skin rash, itching or hives, swelling of the face, lips, or  tongue black, tarry stools bloody or watery diarrhea breathing problems change in emotions or moods change in sex drive changes in vision chest pain or chest tightness chills confusion cough facial flushing fever headache signs and symptoms of high blood sugar such as dizziness; dry mouth; dry skin; fruity breath; nausea; stomach pain; increased hunger or thirst; increased urination signs and symptoms of liver injury like dark yellow or brown urine; general ill feeling or flu-like symptoms; light-colored stools; loss of appetite; nausea; right upper belly pain; unusually weak or tired; yellowing of the eyes or skin stomach pain trouble passing urine or change in the amount of urine weight gain or weight loss Side effects that usually do not require medical attention (report these to your doctor or health care professional if they continue or are bothersome): bone pain constipation loss of appetite muscle pain nausea swelling of the ankles, feet, hands tiredness This list may not describe all possible side effects. Call your doctor for medical advice about side effects. You may report side effects to FDA at 1-800-FDA-1088. Where should I keep my medication? This  drug is given in a hospital or clinic and will not be stored at home. NOTE: This sheet is a summary. It may not cover all possible information. If you have questions about this medicine, talk to your doctor, pharmacist, or health care provider.  2022 Elsevier/Gold Standard (2019-04-01 13:01:29)

## 2020-11-13 NOTE — Progress Notes (Signed)
[facility] OFFICE PROGRESS NOTE   Diagnosis: Cholangiocarcinoma  INTERVAL HISTORY:   Mr. Thebeau completed another cycle of systemic therapy beginning 10/30/2020.  No nausea, mouth sores, or diarrhea.  He has intermittent numbness in the extremities.  Good appetite.  He complains of insomnia.  He had a mild sore throat over the past few days.  Objective:  Vital signs in last 24 hours:  Blood pressure 126/84, pulse 65, temperature 98 F (36.7 C), temperature source Oral, resp. rate 20, height _0  (1.88 m), weight 244 lb 3.2 oz (110.8 kg), SpO2 100 %.    HEENT: No thrush or ulcers, pharynx without erythema or exudate Resp: Lungs clear bilaterally Cardio: Regular rate and rhythm GI: No hepatosplenomegaly, no mass, nontender Vascular: No leg edema   Portacath/PICC-without erythema  Lab Results:  Lab Results  Component Value Date   WBC 7.1 11/13/2020   HGB 9.5 (L) 11/13/2020   HCT 28.6 (L) 11/13/2020   MCV 105.9 (H) 11/13/2020   PLT 184 11/13/2020   NEUTROABS 4.8 11/13/2020    CMP  Lab Results  Component Value Date   NA 139 11/13/2020   K 4.2 11/13/2020   CL 103 11/13/2020   CO2 30 11/13/2020   GLUCOSE 92 11/13/2020   BUN 16 11/13/2020   CREATININE 0.94 11/13/2020   CALCIUM 9.4 11/13/2020   PROT 6.7 11/13/2020   ALBUMIN 3.7 11/13/2020   AST 35 11/13/2020   ALT 20 11/13/2020   ALKPHOS 142 (H) 11/13/2020   BILITOT 0.4 11/13/2020   GFRNONAA >60 11/13/2020   GFRAA 127 01/11/2020    Lab Results  Component Value Date   CEA1 2.15 01/14/2020   CAN199 82 (H) 08/21/2020    Lab Results  Component Value Date   INR 1.1 01/19/2020   LABPROT 13.3 01/19/2020    Imaging:  CT ABDOMEN PELVIS W CONTRAST  Result Date: 11/12/2020 CLINICAL DATA:  Cholangiocarcinoma EXAM: CT ABDOMEN AND PELVIS WITH CONTRAST TECHNIQUE: Multidetector CT imaging of the abdomen and pelvis was performed using the standard protocol following bolus administration of  intravenous contrast. CONTRAST:  44m OMNIPAQUE IOHEXOL 350 MG/ML SOLN COMPARISON:  CT abdomen pelvis dated August 18, 2020 FINDINGS: Lower chest: No acute abnormality. Hepatobiliary: Poorly marginated central liver mass is unchanged in size compared to prior exam, measuring approximately 7.2 by 4.5 cm, unchanged when remeasured in similar plane. Additional lesions are seen scattered throughout the liver which are unchanged compared to prior exam. Reference lesion of segment 4A of the liver measures 4.0 x 3.7 cm, unchanged compared to prior exam when remeasured in similar plane. Gallbladder is decompressed. Unchanged focal dilation of the left hepatic lobe bile ducts occlusion of the left portal vein. No common bile duct dilation. Pancreas: Unremarkable. No pancreatic ductal dilatation or surrounding inflammatory changes. Spleen: Unchanged splenomegaly. Adrenals/Urinary Tract: Bilateral adrenal glands are unremarkable. Kidneys enhance symmetrically with no evidence of hydronephrosis or nephrolithiasis. Unchanged mild bladder wall thickening. Stomach/Bowel: Stomach is within normal limits. Appendix appears normal. No evidence of bowel wall thickening, distention, or inflammatory changes. Vascular/Lymphatic: No arthrosclerotic disease of the abdominal aorta. Stable upper retroperitoneal mesenteric adenopathy. Reference pericaval lymph node measuring 1.9 cm in short axis on series 2, image 36, unchanged when remeasured in similar plane. Lymph node located adjacent to the pancreatic neck measuring 2.7 cm in short axis on image 27, unchanged compared to prior when remeasured in similar plane. Reproductive: Prostate is unremarkable. Other: No abdominal wall hernia or abnormality. No abdominopelvic ascites. Musculoskeletal: No  acute or significant osseous findings. IMPRESSION: Unchanged size of dominant central liver mass and additional scattered smaller liver lesions. Stable upper retroperitoneal mesenteric adenopathy.  Electronically Signed   By: Yetta Glassman M.D.   On: 11/12/2020 18:29    Medications: I have reviewed the patient's current medications.   Assessment/Plan: Cholangiocarcinoma  multiple liver masses and abdominal lymphadenopathy CTs 01/13/2020-rounded hypodense mass appears to arise from the pancreas neck, multiple rim-enhancing masses in the liver, primarily left liver with segmental dilation of the left lobe Intermatic bile ducts, ill-defined hypodensity the central liver with effacement of the left portal vein, enlarged portacaval and retroperitoneal lymph nodes Ultrasound-guided biopsy of the left liver lesion 01/19/2020-adenocarcinoma, cytokeratin 7+, MSS, tumor mutation burden 1, IDH1 R132C MRI abdomen 01/31/2020-poorly marginated central liver mass, multiple smaller similar satellite liver masses, extrinsic mass-effect at the biliary hilum with intrahepatic biliary ductal dilatation throughout the left liver with mild Intermatic dilatation the superior right liver, no pancreas mass or ductal dilatation, normal spleen size, numerous enlarged enhancing lymph nodes at the porta hepatis, peripancreatic, portacaval, aortocaval, left periaortic chains Cycle 1 gemcitabine/cisplatin 02/04/2020 Cycle 2 gemcitabine/cisplatin 02/28/2020 Cycle 3 gemcitabine/cisplatin 03/20/2020 CT abdomen/pelvis 03/31/2020-mild decrease in central liver tumor, mild decrease in size of liver metastases and upper abdominal adenopathy, new splenomegaly Cycle 4 gemcitabine/cisplatin 04/10/2020 Cycle 5 gemcitabine/cisplatin 05/01/2020 Cycle 6 gemcitabine/cisplatin plus durvalumab 05/22/2020 CTs 06/06/2020- dominant central liver mass mildly enlarged, other liver lesions and abdominal adenopathy is stable, no evidence of metastatic disease to the chest Cycle 7 gemcitabine/cisplatin plus Durvalumab 06/12/2020 Cycle 8 gemcitabine/cisplatin plus Durvalumab 06/30/2020 Cycle 9 gemcitabine/cisplatin plus Durvalumab 07/31/2020 CT  abdomen/pelvis 08/20/2020-decreased size of dominant central liver mass, slight increase in size and stable additional liver lesions, stable portacaval node Cycle 10 gemcitabine/cisplatin plus Durvalumab 08/21/2020 Cycle 11 gemcitabine/cisplatin plus Durvalumab 09/11/2020 Cycle 12 gemcitabine/cisplatin plus Durvalumab 10/02/2020 Cycle 13 gemcitabine/cisplatin plus Durvalumab 10/23/2020 CT abdomen/pelvis 11/10/2020-no change in dominant central liver mass and additional liver lesions, stable upper retroperitoneal adenopathy Cycle 14 gemcitabine/cisplatin plus Durvalumab 11/13/2020   Cough-likely related to diaphragmatic irritation from #1 Anorexia/weight loss Hypercalcemia-likely hypercalcemia malignancy, status post intravenous hydration and Zometa 01/14/2020 Diabetes Hyperlipidemia Hypertension History of peripheral neuropathy secondary to diabetes Severe back pain following Fulphila-oxycodone prescribed Thrombocytopenia secondary to chemotherapy-gemcitabine held with day 1 cycle 8 and then dose reduced COVID-19 infection 07/22/2020      Disposition: Matthew Osborne appears unchanged.  The restaging CTs reveal stable disease.  I reviewed the CT images with him.  I recommend continuing gemcitabine/cisplatin and Durvalumab.  He agrees.  He will complete another cycle beginning today.  He will begin a trial of lorazepam to use as needed for insomnia.  Mr. Percival will return for an office visit and the next cycle of chemotherapy in 3 weeks.  He will complete 4 additional cycles of chemotherapy prior to a restaging CT.  Betsy Coder, MD  11/13/2020  9:03 AM

## 2020-11-13 NOTE — Progress Notes (Signed)
Patient presents for treatment. RN assessment completed along with the following:  Labs/vitals reviewed - Yes, and within treatment parameters.   Weight within 10% of previous measurement - Yes Oncology Treatment Attestation completed for current therapy- Yes, on date 02/01/20 Informed consent completed and reflects current therapy/intent - Yes, on date 02/04/20             Provider progress note reviewed - Patient not seen by provider today. Most recent note dated 10/24/19 reviewed. Treatment/Antibody/Supportive plan reviewed - Yes, and there are no adjustments needed for today's treatment. S&H and other orders reviewed - Yes, and there are no additional orders identified. Previous treatment date reviewed - Yes, and the appropriate amount of time has elapsed between treatments.  Patient to proceed with treatment.

## 2020-11-20 ENCOUNTER — Inpatient Hospital Stay: Payer: No Typology Code available for payment source

## 2020-11-20 ENCOUNTER — Other Ambulatory Visit: Payer: Self-pay

## 2020-11-20 ENCOUNTER — Other Ambulatory Visit (HOSPITAL_BASED_OUTPATIENT_CLINIC_OR_DEPARTMENT_OTHER): Payer: Self-pay

## 2020-11-20 VITALS — BP 140/84 | HR 62 | Temp 98.3°F | Resp 20 | Ht 74.0 in | Wt 244.1 lb

## 2020-11-20 DIAGNOSIS — Z5111 Encounter for antineoplastic chemotherapy: Secondary | ICD-10-CM | POA: Diagnosis not present

## 2020-11-20 DIAGNOSIS — C221 Intrahepatic bile duct carcinoma: Secondary | ICD-10-CM

## 2020-11-20 LAB — CMP (CANCER CENTER ONLY)
ALT: 24 U/L (ref 0–44)
AST: 30 U/L (ref 15–41)
Albumin: 3.7 g/dL (ref 3.5–5.0)
Alkaline Phosphatase: 139 U/L — ABNORMAL HIGH (ref 38–126)
Anion gap: 7 (ref 5–15)
BUN: 17 mg/dL (ref 6–20)
CO2: 28 mmol/L (ref 22–32)
Calcium: 9.1 mg/dL (ref 8.9–10.3)
Chloride: 102 mmol/L (ref 98–111)
Creatinine: 0.83 mg/dL (ref 0.61–1.24)
GFR, Estimated: >60 mL/min (ref >60)
Glucose, Bld: 205 mg/dL — ABNORMAL HIGH (ref 70–99)
Potassium: 4.2 mmol/L (ref 3.5–5.1)
Sodium: 137 mmol/L (ref 135–145)
Total Bilirubin: 0.4 mg/dL (ref 0.3–1.2)
Total Protein: 6.7 g/dL (ref 6.5–8.1)

## 2020-11-20 LAB — CBC WITH DIFFERENTIAL (CANCER CENTER ONLY)
Abs Immature Granulocytes: 0.01 10*3/uL (ref 0.00–0.07)
Basophils Absolute: 0.1 10*3/uL (ref 0.0–0.1)
Basophils Relative: 2 %
Eosinophils Absolute: 0.1 10*3/uL (ref 0.0–0.5)
Eosinophils Relative: 3 %
HCT: 26.8 % — ABNORMAL LOW (ref 39.0–52.0)
Hemoglobin: 8.9 g/dL — ABNORMAL LOW (ref 13.0–17.0)
Immature Granulocytes: 0 %
Lymphocytes Relative: 30 %
Lymphs Abs: 0.8 10*3/uL (ref 0.7–4.0)
MCH: 34.8 pg — ABNORMAL HIGH (ref 26.0–34.0)
MCHC: 33.2 g/dL (ref 30.0–36.0)
MCV: 104.7 fL — ABNORMAL HIGH (ref 80.0–100.0)
Monocytes Absolute: 0.5 10*3/uL (ref 0.1–1.0)
Monocytes Relative: 18 %
Neutro Abs: 1.3 10*3/uL — ABNORMAL LOW (ref 1.7–7.7)
Neutrophils Relative %: 47 %
Platelet Count: 152 10*3/uL (ref 150–400)
RBC: 2.56 MIL/uL — ABNORMAL LOW (ref 4.22–5.81)
RDW: 14.8 % (ref 11.5–15.5)
WBC Count: 2.8 10*3/uL — ABNORMAL LOW (ref 4.0–10.5)
nRBC: 0 % (ref 0.0–0.2)

## 2020-11-20 LAB — MAGNESIUM: Magnesium: 1.8 mg/dL (ref 1.7–2.4)

## 2020-11-20 MED ORDER — DEXAMETHASONE SODIUM PHOSPHATE 10 MG/ML IJ SOLN
5.0000 mg | Freq: Once | INTRAMUSCULAR | Status: AC
  Administered 2020-11-20: 5 mg via INTRAVENOUS
  Filled 2020-11-20: qty 1

## 2020-11-20 MED ORDER — SODIUM CHLORIDE 0.9 % IV SOLN: Freq: Once | INTRAVENOUS | Status: AC

## 2020-11-20 MED ORDER — FAMOTIDINE 20 MG IN NS 100 ML IVPB
20.0000 mg | Freq: Once | INTRAVENOUS | Status: AC
  Administered 2020-11-20: 20 mg via INTRAVENOUS
  Filled 2020-11-20: qty 100

## 2020-11-20 MED ORDER — SODIUM CHLORIDE 0.9 % IV SOLN
800.0000 mg/m2 | Freq: Once | INTRAVENOUS | Status: AC
  Administered 2020-11-20: 1862 mg via INTRAVENOUS
  Filled 2020-11-20: qty 48.97

## 2020-11-20 MED ORDER — HEPARIN SOD (PORK) LOCK FLUSH 100 UNIT/ML IV SOLN: 500.0000 [IU] | Freq: Once | INTRAVENOUS | Status: DC | PRN

## 2020-11-20 MED ORDER — POTASSIUM CHLORIDE IN NACL 20-0.9 MEQ/L-% IV SOLN
Freq: Once | INTRAVENOUS | Status: AC
  Filled 2020-11-20: qty 1000

## 2020-11-20 MED ORDER — SODIUM CHLORIDE 0.9 % IV SOLN
25.0000 mg/m2 | Freq: Once | INTRAVENOUS | Status: AC
  Administered 2020-11-20: 59 mg via INTRAVENOUS
  Filled 2020-11-20: qty 59

## 2020-11-20 MED ORDER — SODIUM CHLORIDE 0.9 % IV SOLN
150.0000 mg | Freq: Once | INTRAVENOUS | Status: AC
  Administered 2020-11-20: 150 mg via INTRAVENOUS
  Filled 2020-11-20: qty 5

## 2020-11-20 MED ORDER — PALONOSETRON HCL INJECTION 0.25 MG/5ML
0.2500 mg | Freq: Once | INTRAVENOUS | Status: AC
  Administered 2020-11-20: 0.25 mg via INTRAVENOUS
  Filled 2020-11-20: qty 5

## 2020-11-20 MED ORDER — SODIUM CHLORIDE 0.9% FLUSH: 10.0000 mL | INTRAVENOUS | Status: DC | PRN

## 2020-11-20 MED ORDER — MAGNESIUM SULFATE 2 GM/50ML IV SOLN
2.0000 g | Freq: Once | INTRAVENOUS | Status: AC
  Administered 2020-11-20: 2 g via INTRAVENOUS
  Filled 2020-11-20: qty 50

## 2020-11-20 MED FILL — Insulin Pen Needle 31 G X 5 MM (1/5" or 3/16"): 25 days supply | Qty: 100 | Fill #5 | Status: AC

## 2020-11-20 NOTE — Progress Notes (Signed)
Patient presents for treatment. RN assessment completed along with the following:  Labs/vitals reviewed - Yes, and ANC 1.3, Per Dr. Benay Spice, okay to proceed ,if he is getting gcsf tomorrow.     Weight within 10% of previous measurement - Yes Oncology Treatment Attestation completed for current therapy- Yes, on date 02/01/2020 Informed consent completed and reflects current therapy/intent - Yes, on date 02/04/2020             Provider progress note reviewed - Patient not seen by provider today. Most recent note dated 11/13/2020 reviewed. Treatment/Antibody/Supportive plan reviewed - Yes, and there are no adjustments needed for today's treatment. S&H and other orders reviewed - Yes, and there are no additional orders identified. Previous treatment date reviewed - Yes, and the appropriate amount of time has elapsed between treatments. Clinic Hand Off Received from - No, Patient not seen by provider today.  Patient to proceed with treatment.

## 2020-11-20 NOTE — Patient Instructions (Signed)
Matthew Osborne   Discharge Instructions: Thank you for choosing [facility] to provide your oncology and hematology care.   If you have a lab appointment with the [facility], please go directly to the [facility] and check in at the registration area.   Wear comfortable clothing and clothing appropriate for easy access to any Portacath or PICC line.   We strive to give you quality time with your provider. You may need to reschedule your appointment if you arrive late (15 or more minutes).  Arriving late affects you and other patients whose appointments are after yours.  Also, if you miss three or more appointments without notifying the office, you may be dismissed from the clinic at the provider's discretion.      For prescription refill requests, have your pharmacy contact our office and allow 72 hours for refills to be completed.    Today you received the following chemotherapy and/or immunotherapy agents Gemcitabine (GEMZAR) & Cisplatin (PLATINOL).      To help prevent nausea and vomiting after your treatment, we encourage you to take your nausea medication as directed.  BELOW ARE SYMPTOMS THAT SHOULD BE REPORTED IMMEDIATELY: *FEVER GREATER THAN 100.4 F (38 C) OR HIGHER *CHILLS OR SWEATING *NAUSEA AND VOMITING THAT IS NOT CONTROLLED WITH YOUR NAUSEA MEDICATION *UNUSUAL SHORTNESS OF BREATH *UNUSUAL BRUISING OR BLEEDING *URINARY PROBLEMS (pain or burning when urinating, or frequent urination) *BOWEL PROBLEMS (unusual diarrhea, constipation, pain near the anus) TENDERNESS IN MOUTH AND THROAT WITH OR WITHOUT PRESENCE OF ULCERS (sore throat, sores in mouth, or a toothache) UNUSUAL RASH, SWELLING OR PAIN  UNUSUAL VAGINAL DISCHARGE OR ITCHING   Items with * indicate a potential emergency and should be followed up as soon as possible or go to the Emergency Department if any problems should occur.  Please show the CHEMOTHERAPY ALERT CARD or  IMMUNOTHERAPY ALERT CARD at check-in to the Emergency Department and triage nurse.  Should you have questions after your visit or need to cancel or reschedule your appointment, please contact [facility]  Dept: [phone]  and follow the prompts.  Office hours are 8:00 a.m. to 4:30 p.m. Monday - Friday. Please note that voicemails left after 4:00 p.m. may not be returned until the following business day.  We are closed weekends and major holidays. You have access to a nurse at all times for urgent questions. Please call the main number to the clinic Dept: [phone] and follow the prompts.   For any non-urgent questions, you may also contact your provider using MyChart. We now offer e-Visits for anyone 73 and older to request care online for non-urgent symptoms. For details visit [email or web address].   Also download the MyChart app! Go to the app store, search "MyChart", open the app, select [facility], and log in with your MyChart username and password.  Due to Covid, a mask is required upon entering the hospital/clinic. If you do not have a mask, one will be given to you upon arrival. For doctor visits, patients may have 1 support person aged 51 or older with them. For treatment visits, patients cannot have anyone with them due to current Covid guidelines and our immunocompromised population.   Gemcitabine injection What is this medication? GEMCITABINE (jem SYE ta been) is a chemotherapy drug. This medicine is used to treat many types of cancer like breast cancer, lung cancer, pancreatic cancer, and ovarian cancer. This medicine may be used for other purposes; ask  your health care provider or pharmacist if you have questions. COMMON BRAND NAME(S): Gemzar, Infugem What should I tell my care team before I take this medication? They need to know if you have any of these conditions: blood disorders infection kidney disease liver disease lung or breathing  disease, like asthma recent or ongoing radiation therapy an unusual or allergic reaction to gemcitabine, other chemotherapy, other medicines, foods, dyes, or preservatives pregnant or trying to get pregnant breast-feeding How should I use this medication? This drug is given as an infusion into a vein. It is administered in a hospital or clinic by a specially trained health care professional. Talk to your pediatrician regarding the use of this medicine in children. Special care may be needed. Overdosage: If you think you have taken too much of this medicine contact a poison control center or emergency room at once. NOTE: This medicine is only for you. Do not share this medicine with others. What if I miss a dose? It is important not to miss your dose. Call your doctor or health care professional if you are unable to keep an appointment. What may interact with this medication? medicines to increase blood counts like filgrastim, pegfilgrastim, sargramostim some other chemotherapy drugs like cisplatin vaccines Talk to your doctor or health care professional before taking any of these medicines: acetaminophen aspirin ibuprofen ketoprofen naproxen This list may not describe all possible interactions. Give your health care provider a list of all the medicines, herbs, non-prescription drugs, or dietary supplements you use. Also tell them if you smoke, drink alcohol, or use illegal drugs. Some items may interact with your medicine. What should I watch for while using this medication? Visit your doctor for checks on your progress. This drug may make you feel generally unwell. This is not uncommon, as chemotherapy can affect healthy cells as well as cancer cells. Report any side effects. Continue your course of treatment even though you feel ill unless your doctor tells you to stop. In some cases, you may be given additional medicines to help with side effects. Follow all directions for their  use. Call your doctor or health care professional for advice if you get a fever, chills or sore throat, or other symptoms of a cold or flu. Do not treat yourself. This drug decreases your body's ability to fight infections. Try to avoid being around people who are sick. This medicine may increase your risk to bruise or bleed. Call your doctor or health care professional if you notice any unusual bleeding. Be careful brushing and flossing your teeth or using a toothpick because you may get an infection or bleed more easily. If you have any dental work done, tell your dentist you are receiving this medicine. Avoid taking products that contain aspirin, acetaminophen, ibuprofen, naproxen, or ketoprofen unless instructed by your doctor. These medicines may hide a fever. Do not become pregnant while taking this medicine or for 6 months after stopping it. Women should inform their doctor if they wish to become pregnant or think they might be pregnant. Men should not father a child while taking this medicine and for 3 months after stopping it. There is a potential for serious side effects to an unborn child. Talk to your health care professional or pharmacist for more information. Do not breast-feed an infant while taking this medicine or for at least 1 week after stopping it. Men should inform their doctors if they wish to father a child. This medicine may lower sperm counts. Talk with  your doctor or health care professional if you are concerned about your fertility. What side effects may I notice from receiving this medication? Side effects that you should report to your doctor or health care professional as soon as possible: allergic reactions like skin rash, itching or hives, swelling of the face, lips, or tongue breathing problems pain, redness, or irritation at site where injected signs and symptoms of a dangerous change in heartbeat or heart rhythm like chest pain; dizziness; fast or irregular heartbeat;  palpitations; feeling faint or lightheaded, falls; breathing problems signs of decreased platelets or bleeding - bruising, pinpoint red spots on the skin, black, tarry stools, blood in the urine signs of decreased red blood cells - unusually weak or tired, feeling faint or lightheaded, falls signs of infection - fever or chills, cough, sore throat, pain or difficulty passing urine signs and symptoms of kidney injury like trouble passing urine or change in the amount of urine signs and symptoms of liver injury like dark yellow or brown urine; general ill feeling or flu-like symptoms; light-colored stools; loss of appetite; nausea; right upper belly pain; unusually weak or tired; yellowing of the eyes or skin swelling of ankles, feet, hands Side effects that usually do not require medical attention (report to your doctor or health care professional if they continue or are bothersome): constipation diarrhea hair loss loss of appetite nausea rash vomiting This list may not describe all possible side effects. Call your doctor for medical advice about side effects. You may report side effects to FDA at 1-800-FDA-1088. Where should I keep my medication? This drug is given in a hospital or clinic and will not be stored at home. NOTE: This sheet is a summary. It may not cover all possible information. If you have questions about this medicine, talk to your doctor, pharmacist, or health care provider.  2022 Elsevier/Gold Standard (2017-04-16 18:06:11)  Cisplatin injection What is this medication? CISPLATIN (SIS pla tin) is a chemotherapy drug. It targets fast dividing cells, like cancer cells, and causes these cells to die. This medicine is used to treat many types of cancer like bladder, ovarian, and testicular cancers. This medicine may be used for other purposes; ask your health care provider or pharmacist if you have questions. COMMON BRAND NAME(S): Platinol, Platinol -AQ What should I tell my  care team before I take this medication? They need to know if you have any of these conditions: eye disease, vision problems hearing problems kidney disease low blood counts, like white cells, platelets, or red blood cells tingling of the fingers or toes, or other nerve disorder an unusual or allergic reaction to cisplatin, carboplatin, oxaliplatin, other medicines, foods, dyes, or preservatives pregnant or trying to get pregnant breast-feeding How should I use this medication? This drug is given as an infusion into a vein. It is administered in a hospital or clinic by a specially trained health care professional. Talk to your pediatrician regarding the use of this medicine in children. Special care may be needed. Overdosage: If you think you have taken too much of this medicine contact a poison control center or emergency room at once. NOTE: This medicine is only for you. Do not share this medicine with others. What if I miss a dose? It is important not to miss a dose. Call your doctor or health care professional if you are unable to keep an appointment. What may interact with this medication? This medicine may interact with the following medications: foscarnet certain antibiotics like amikacin,  gentamicin, neomycin, polymyxin B, streptomycin, tobramycin, vancomycin This list may not describe all possible interactions. Give your health care provider a list of all the medicines, herbs, non-prescription drugs, or dietary supplements you use. Also tell them if you smoke, drink alcohol, or use illegal drugs. Some items may interact with your medicine. What should I watch for while using this medication? Your condition will be monitored carefully while you are receiving this medicine. You will need important blood work done while you are taking this medicine. This drug may make you feel generally unwell. This is not uncommon, as chemotherapy can affect healthy cells as well as cancer cells.  Report any side effects. Continue your course of treatment even though you feel ill unless your doctor tells you to stop. This medicine may increase your risk of getting an infection. Call your healthcare professional for advice if you get a fever, chills, or sore throat, or other symptoms of a cold or flu. Do not treat yourself. Try to avoid being around people who are sick. Avoid taking medicines that contain aspirin, acetaminophen, ibuprofen, naproxen, or ketoprofen unless instructed by your healthcare professional. These medicines may hide a fever. This medicine may increase your risk to bruise or bleed. Call your doctor or health care professional if you notice any unusual bleeding. Be careful brushing and flossing your teeth or using a toothpick because you may get an infection or bleed more easily. If you have any dental work done, tell your dentist you are receiving this medicine. Do not become pregnant while taking this medicine or for 14 months after stopping it. Women should inform their healthcare professional if they wish to become pregnant or think they might be pregnant. Men should not father a child while taking this medicine and for 11 months after stopping it. There is potential for serious side effects to an unborn child. Talk to your healthcare professional for more information. Do not breast-feed an infant while taking this medicine. This medicine has caused ovarian failure in some women. This medicine may make it more difficult to get pregnant. Talk to your healthcare professional if you are concerned about your fertility. This medicine has caused decreased sperm counts in some men. This may make it more difficult to father a child. Talk to your healthcare professional if you are concerned about your fertility. Drink fluids as directed while you are taking this medicine. This will help protect your kidneys. Call your doctor or health care professional if you get diarrhea. Do not treat  yourself. What side effects may I notice from receiving this medication? Side effects that you should report to your doctor or health care professional as soon as possible: allergic reactions like skin rash, itching or hives, swelling of the face, lips, or tongue blurred vision changes in vision decreased hearing or ringing of the ears nausea, vomiting pain, redness, or irritation at site where injected pain, tingling, numbness in the hands or feet signs and symptoms of bleeding such as bloody or black, tarry stools; red or dark brown urine; spitting up blood or brown material that looks like coffee grounds; red spots on the skin; unusual bruising or bleeding from the eyes, gums, or nose signs and symptoms of infection like fever; chills; cough; sore throat; pain or trouble passing urine signs and symptoms of kidney injury like trouble passing urine or change in the amount of urine signs and symptoms of low red blood cells or anemia such as unusually weak or tired; feeling faint or  lightheaded; falls; breathing problems Side effects that usually do not require medical attention (report to your doctor or health care professional if they continue or are bothersome): loss of appetite mouth sores muscle cramps This list may not describe all possible side effects. Call your doctor for medical advice about side effects. You may report side effects to FDA at 1-800-FDA-1088. Where should I keep my medication? This drug is given in a hospital or clinic and will not be stored at home. NOTE: This sheet is a summary. It may not cover all possible information. If you have questions about this medicine, talk to your doctor, pharmacist, or health care provider.  2022 Elsevier/Gold Standard (2018-01-16 15:59:17)

## 2020-11-21 ENCOUNTER — Inpatient Hospital Stay: Payer: No Typology Code available for payment source

## 2020-11-21 VITALS — BP 151/85 | HR 78 | Temp 98.2°F | Resp 20

## 2020-11-21 DIAGNOSIS — C221 Intrahepatic bile duct carcinoma: Secondary | ICD-10-CM

## 2020-11-21 DIAGNOSIS — Z5111 Encounter for antineoplastic chemotherapy: Secondary | ICD-10-CM | POA: Diagnosis not present

## 2020-11-21 MED ORDER — PEGFILGRASTIM-JMDB 6 MG/0.6ML ~~LOC~~ SOSY
6.0000 mg | PREFILLED_SYRINGE | Freq: Once | SUBCUTANEOUS | Status: AC
  Administered 2020-11-21: 6 mg via SUBCUTANEOUS

## 2020-12-03 ENCOUNTER — Other Ambulatory Visit: Payer: Self-pay | Admitting: Oncology

## 2020-12-04 ENCOUNTER — Inpatient Hospital Stay: Payer: No Typology Code available for payment source

## 2020-12-04 ENCOUNTER — Other Ambulatory Visit: Payer: Self-pay

## 2020-12-04 ENCOUNTER — Inpatient Hospital Stay (HOSPITAL_BASED_OUTPATIENT_CLINIC_OR_DEPARTMENT_OTHER): Payer: No Typology Code available for payment source | Admitting: Oncology

## 2020-12-04 VITALS — BP 154/86 | HR 84 | Temp 98.1°F | Resp 20 | Ht 74.0 in | Wt 241.6 lb

## 2020-12-04 DIAGNOSIS — C221 Intrahepatic bile duct carcinoma: Secondary | ICD-10-CM | POA: Diagnosis not present

## 2020-12-04 DIAGNOSIS — Z5111 Encounter for antineoplastic chemotherapy: Secondary | ICD-10-CM | POA: Diagnosis not present

## 2020-12-04 LAB — CBC WITH DIFFERENTIAL (CANCER CENTER ONLY)
Abs Immature Granulocytes: 0.03 10*3/uL (ref 0.00–0.07)
Basophils Absolute: 0.1 10*3/uL (ref 0.0–0.1)
Basophils Relative: 1 %
Eosinophils Absolute: 0.3 10*3/uL (ref 0.0–0.5)
Eosinophils Relative: 4 %
HCT: 27.3 % — ABNORMAL LOW (ref 39.0–52.0)
Hemoglobin: 9.1 g/dL — ABNORMAL LOW (ref 13.0–17.0)
Immature Granulocytes: 1 %
Lymphocytes Relative: 13 %
Lymphs Abs: 0.9 10*3/uL (ref 0.7–4.0)
MCH: 35.5 pg — ABNORMAL HIGH (ref 26.0–34.0)
MCHC: 33.3 g/dL (ref 30.0–36.0)
MCV: 106.6 fL — ABNORMAL HIGH (ref 80.0–100.0)
Monocytes Absolute: 0.8 10*3/uL (ref 0.1–1.0)
Monocytes Relative: 12 %
Neutro Abs: 4.6 10*3/uL (ref 1.7–7.7)
Neutrophils Relative %: 69 %
Platelet Count: 131 10*3/uL — ABNORMAL LOW (ref 150–400)
RBC: 2.56 MIL/uL — ABNORMAL LOW (ref 4.22–5.81)
RDW: 15.6 % — ABNORMAL HIGH (ref 11.5–15.5)
WBC Count: 6.6 10*3/uL (ref 4.0–10.5)
nRBC: 0 % (ref 0.0–0.2)

## 2020-12-04 LAB — CMP (CANCER CENTER ONLY)
ALT: 17 U/L (ref 0–44)
AST: 37 U/L (ref 15–41)
Albumin: 3.7 g/dL (ref 3.5–5.0)
Alkaline Phosphatase: 149 U/L — ABNORMAL HIGH (ref 38–126)
Anion gap: 6 (ref 5–15)
BUN: 16 mg/dL (ref 6–20)
CO2: 28 mmol/L (ref 22–32)
Calcium: 8.7 mg/dL — ABNORMAL LOW (ref 8.9–10.3)
Chloride: 102 mmol/L (ref 98–111)
Creatinine: 1.04 mg/dL (ref 0.61–1.24)
GFR, Estimated: >60 mL/min (ref >60)
Glucose, Bld: 170 mg/dL — ABNORMAL HIGH (ref 70–99)
Potassium: 4 mmol/L (ref 3.5–5.1)
Sodium: 136 mmol/L (ref 135–145)
Total Bilirubin: 0.7 mg/dL (ref 0.3–1.2)
Total Protein: 6.8 g/dL (ref 6.5–8.1)

## 2020-12-04 LAB — MAGNESIUM: Magnesium: 2 mg/dL (ref 1.7–2.4)

## 2020-12-04 MED ORDER — SODIUM CHLORIDE 0.9% FLUSH
10.0000 mL | INTRAVENOUS | Status: DC | PRN
Start: 2020-12-04 — End: 2020-12-04
  Administered 2020-12-04: 10 mL

## 2020-12-04 MED ORDER — POTASSIUM CHLORIDE IN NACL 20-0.9 MEQ/L-% IV SOLN
Freq: Once | INTRAVENOUS | Status: AC
  Filled 2020-12-04: qty 1000

## 2020-12-04 MED ORDER — SODIUM CHLORIDE 0.9 % IV SOLN
150.0000 mg | Freq: Once | INTRAVENOUS | Status: AC
  Administered 2020-12-04: 150 mg via INTRAVENOUS
  Filled 2020-12-04: qty 5

## 2020-12-04 MED ORDER — SODIUM CHLORIDE 0.9 % IV SOLN
800.0000 mg/m2 | Freq: Once | INTRAVENOUS | Status: AC
  Administered 2020-12-04: 1862 mg via INTRAVENOUS
  Filled 2020-12-04: qty 48.97

## 2020-12-04 MED ORDER — DEXAMETHASONE SODIUM PHOSPHATE 10 MG/ML IJ SOLN
5.0000 mg | Freq: Once | INTRAMUSCULAR | Status: AC
  Administered 2020-12-04: 5 mg via INTRAVENOUS
  Filled 2020-12-04: qty 1

## 2020-12-04 MED ORDER — MAGNESIUM SULFATE 2 GM/50ML IV SOLN
2.0000 g | Freq: Once | INTRAVENOUS | Status: AC
  Administered 2020-12-04: 2 g via INTRAVENOUS
  Filled 2020-12-04: qty 50

## 2020-12-04 MED ORDER — SODIUM CHLORIDE 0.9 % IV SOLN: Freq: Once | INTRAVENOUS | Status: AC

## 2020-12-04 MED ORDER — FAMOTIDINE 20 MG IN NS 100 ML IVPB
20.0000 mg | Freq: Once | INTRAVENOUS | Status: AC
  Administered 2020-12-04: 20 mg via INTRAVENOUS
  Filled 2020-12-04: qty 100

## 2020-12-04 MED ORDER — PALONOSETRON HCL INJECTION 0.25 MG/5ML
0.2500 mg | Freq: Once | INTRAVENOUS | Status: AC
  Administered 2020-12-04: 0.25 mg via INTRAVENOUS
  Filled 2020-12-04: qty 5

## 2020-12-04 MED ORDER — HEPARIN SOD (PORK) LOCK FLUSH 100 UNIT/ML IV SOLN
500.0000 [IU] | Freq: Once | INTRAVENOUS | Status: AC | PRN
  Administered 2020-12-04: 500 [IU]

## 2020-12-04 MED ORDER — SODIUM CHLORIDE 0.9 % IV SOLN
25.0000 mg/m2 | Freq: Once | INTRAVENOUS | Status: AC
  Administered 2020-12-04: 59 mg via INTRAVENOUS
  Filled 2020-12-04: qty 59

## 2020-12-04 MED ORDER — SODIUM CHLORIDE 0.9 % IV SOLN
1500.0000 mg | Freq: Once | INTRAVENOUS | Status: AC
  Administered 2020-12-04: 1500 mg via INTRAVENOUS
  Filled 2020-12-04: qty 30

## 2020-12-04 NOTE — Progress Notes (Signed)
Patient seen by Dr. Sherrill today ? ?Vitals are within treatment parameters. ? ?Labs reviewed by Dr. Sherrill and are within treatment parameters. ? ?Per physician team, patient is ready for treatment and there are NO modifications to the treatment plan.  ?

## 2020-12-04 NOTE — Patient Instructions (Signed)
Matthew Osborne   Discharge Instructions: Thank you for choosing [facility] to provide your oncology and hematology care.   If you have a lab appointment with the [facility], please go directly to the [facility] and check in at the registration area.   Wear comfortable clothing and clothing appropriate for easy access to any Portacath or PICC line.   We strive to give you quality time with your provider. You may need to reschedule your appointment if you arrive late (15 or more minutes).  Arriving late affects you and other patients whose appointments are after yours.  Also, if you miss three or more appointments without notifying the office, you may be dismissed from the clinic at the provider's discretion.      For prescription refill requests, have your pharmacy contact our office and allow 72 hours for refills to be completed.    Today you received the following chemotherapy and/or immunotherapy agents Durvalumab (IMFINZI), Gemcitabine (GEMZAR) & Cisplatin (PLATINOL).      To help prevent nausea and vomiting after your treatment, we encourage you to take your nausea medication as directed.  BELOW ARE SYMPTOMS THAT SHOULD BE REPORTED IMMEDIATELY: *FEVER GREATER THAN 100.4 F (38 C) OR HIGHER *CHILLS OR SWEATING *NAUSEA AND VOMITING THAT IS NOT CONTROLLED WITH YOUR NAUSEA MEDICATION *UNUSUAL SHORTNESS OF BREATH *UNUSUAL BRUISING OR BLEEDING *URINARY PROBLEMS (pain or burning when urinating, or frequent urination) *BOWEL PROBLEMS (unusual diarrhea, constipation, pain near the anus) TENDERNESS IN MOUTH AND THROAT WITH OR WITHOUT PRESENCE OF ULCERS (sore throat, sores in mouth, or a toothache) UNUSUAL RASH, SWELLING OR PAIN  UNUSUAL VAGINAL DISCHARGE OR ITCHING   Items with * indicate a potential emergency and should be followed up as soon as possible or go to the Emergency Department if any problems should occur.  Please show the CHEMOTHERAPY  ALERT CARD or IMMUNOTHERAPY ALERT CARD at check-in to the Emergency Department and triage nurse.  Should you have questions after your visit or need to cancel or reschedule your appointment, please contact [facility]  Dept: [phone]  and follow the prompts.  Office hours are 8:00 a.m. to 4:30 p.m. Monday - Friday. Please note that voicemails left after 4:00 p.m. may not be returned until the following business day.  We are closed weekends and major holidays. You have access to a nurse at all times for urgent questions. Please call the main number to the clinic Dept: [phone] and follow the prompts.   For any non-urgent questions, you may also contact your provider using MyChart. We now offer e-Visits for anyone 13 and older to request care online for non-urgent symptoms. For details visit [email or web address].   Also download the MyChart app! Go to the app store, search "MyChart", open the app, select [facility], and log in with your MyChart username and password.  Due to Covid, a mask is required upon entering the hospital/clinic. If you do not have a mask, one will be given to you upon arrival. For doctor visits, patients may have 1 support person aged 49 or older with them. For treatment visits, patients cannot have anyone with them due to current Covid guidelines and our immunocompromised population.   Durvalumab injection What is this medication? DURVALUMAB (dur VAL ue mab) is a monoclonal antibody. It is used to treat lung cancer. This medicine may be used for other purposes; ask your health care provider or pharmacist if you have questions. COMMON  BRAND NAME(S): IMFINZI What should I tell my care team before I take this medication? They need to know if you have any of these conditions: autoimmune diseases like Crohn's disease, ulcerative colitis, or lupus have had or planning to have an allogeneic stem cell transplant (uses someone else's stem  cells) history of organ transplant history of radiation to the chest nervous system problems like myasthenia gravis or Guillain-Barre syndrome an unusual or allergic reaction to durvalumab, other medicines, foods, dyes, or preservatives pregnant or trying to get pregnant breast-feeding How should I use this medication? This medicine is for infusion into a vein. It is given by a health care professional in a hospital or clinic setting. A special MedGuide will be given to you before each treatment. Be sure to read this information carefully each time. Talk to your pediatrician regarding the use of this medicine in children. Special care may be needed. Overdosage: If you think you have taken too much of this medicine contact a poison control center or emergency room at once. NOTE: This medicine is only for you. Do not share this medicine with others. What if I miss a dose? It is important not to miss your dose. Call your doctor or health care professional if you are unable to keep an appointment. What may interact with this medication? Interactions have not been studied. This list may not describe all possible interactions. Give your health care provider a list of all the medicines, herbs, non-prescription drugs, or dietary supplements you use. Also tell them if you smoke, drink alcohol, or use illegal drugs. Some items may interact with your medicine. What should I watch for while using this medication? This drug may make you feel generally unwell. Continue your course of treatment even though you feel ill unless your doctor tells you to stop. You may need blood work done while you are taking this medicine. Do not become pregnant while taking this medicine or for 3 months after stopping it. Women should inform their doctor if they wish to become pregnant or think they might be pregnant. There is a potential for serious side effects to an unborn child. Talk to your health care professional or  pharmacist for more information. Do not breast-feed an infant while taking this medicine or for 3 months after stopping it. What side effects may I notice from receiving this medication? Side effects that you should report to your doctor or health care professional as soon as possible: allergic reactions like skin rash, itching or hives, swelling of the face, lips, or tongue black, tarry stools bloody or watery diarrhea breathing problems change in emotions or moods change in sex drive changes in vision chest pain or chest tightness chills confusion cough facial flushing fever headache signs and symptoms of high blood sugar such as dizziness; dry mouth; dry skin; fruity breath; nausea; stomach pain; increased hunger or thirst; increased urination signs and symptoms of liver injury like dark yellow or brown urine; general ill feeling or flu-like symptoms; light-colored stools; loss of appetite; nausea; right upper belly pain; unusually weak or tired; yellowing of the eyes or skin stomach pain trouble passing urine or change in the amount of urine weight gain or weight loss Side effects that usually do not require medical attention (report these to your doctor or health care professional if they continue or are bothersome): bone pain constipation loss of appetite muscle pain nausea swelling of the ankles, feet, hands tiredness This list may not describe all possible side effects.  Call your doctor for medical advice about side effects. You may report side effects to FDA at 1-800-FDA-1088. Where should I keep my medication? This drug is given in a hospital or clinic and will not be stored at home. NOTE: This sheet is a summary. It may not cover all possible information. If you have questions about this medicine, talk to your doctor, pharmacist, or health care provider.  2022 Elsevier/Gold Standard (2019-04-01 13:01:29)  Gemcitabine injection What is this medication? GEMCITABINE  (jem SYE ta been) is a chemotherapy drug. This medicine is used to treat many types of cancer like breast cancer, lung cancer, pancreatic cancer, and ovarian cancer. This medicine may be used for other purposes; ask your health care provider or pharmacist if you have questions. COMMON BRAND NAME(S): Gemzar, Infugem What should I tell my care team before I take this medication? They need to know if you have any of these conditions: blood disorders infection kidney disease liver disease lung or breathing disease, like asthma recent or ongoing radiation therapy an unusual or allergic reaction to gemcitabine, other chemotherapy, other medicines, foods, dyes, or preservatives pregnant or trying to get pregnant breast-feeding How should I use this medication? This drug is given as an infusion into a vein. It is administered in a hospital or clinic by a specially trained health care professional. Talk to your pediatrician regarding the use of this medicine in children. Special care may be needed. Overdosage: If you think you have taken too much of this medicine contact a poison control center or emergency room at once. NOTE: This medicine is only for you. Do not share this medicine with others. What if I miss a dose? It is important not to miss your dose. Call your doctor or health care professional if you are unable to keep an appointment. What may interact with this medication? medicines to increase blood counts like filgrastim, pegfilgrastim, sargramostim some other chemotherapy drugs like cisplatin vaccines Talk to your doctor or health care professional before taking any of these medicines: acetaminophen aspirin ibuprofen ketoprofen naproxen This list may not describe all possible interactions. Give your health care provider a list of all the medicines, herbs, non-prescription drugs, or dietary supplements you use. Also tell them if you smoke, drink alcohol, or use illegal drugs. Some  items may interact with your medicine. What should I watch for while using this medication? Visit your doctor for checks on your progress. This drug may make you feel generally unwell. This is not uncommon, as chemotherapy can affect healthy cells as well as cancer cells. Report any side effects. Continue your course of treatment even though you feel ill unless your doctor tells you to stop. In some cases, you may be given additional medicines to help with side effects. Follow all directions for their use. Call your doctor or health care professional for advice if you get a fever, chills or sore throat, or other symptoms of a cold or flu. Do not treat yourself. This drug decreases your body's ability to fight infections. Try to avoid being around people who are sick. This medicine may increase your risk to bruise or bleed. Call your doctor or health care professional if you notice any unusual bleeding. Be careful brushing and flossing your teeth or using a toothpick because you may get an infection or bleed more easily. If you have any dental work done, tell your dentist you are receiving this medicine. Avoid taking products that contain aspirin, acetaminophen, ibuprofen, naproxen, or ketoprofen unless  instructed by your doctor. These medicines may hide a fever. Do not become pregnant while taking this medicine or for 6 months after stopping it. Women should inform their doctor if they wish to become pregnant or think they might be pregnant. Men should not father a child while taking this medicine and for 3 months after stopping it. There is a potential for serious side effects to an unborn child. Talk to your health care professional or pharmacist for more information. Do not breast-feed an infant while taking this medicine or for at least 1 week after stopping it. Men should inform their doctors if they wish to father a child. This medicine may lower sperm counts. Talk with your doctor or health care  professional if you are concerned about your fertility. What side effects may I notice from receiving this medication? Side effects that you should report to your doctor or health care professional as soon as possible: allergic reactions like skin rash, itching or hives, swelling of the face, lips, or tongue breathing problems pain, redness, or irritation at site where injected signs and symptoms of a dangerous change in heartbeat or heart rhythm like chest pain; dizziness; fast or irregular heartbeat; palpitations; feeling faint or lightheaded, falls; breathing problems signs of decreased platelets or bleeding - bruising, pinpoint red spots on the skin, black, tarry stools, blood in the urine signs of decreased red blood cells - unusually weak or tired, feeling faint or lightheaded, falls signs of infection - fever or chills, cough, sore throat, pain or difficulty passing urine signs and symptoms of kidney injury like trouble passing urine or change in the amount of urine signs and symptoms of liver injury like dark yellow or brown urine; general ill feeling or flu-like symptoms; light-colored stools; loss of appetite; nausea; right upper belly pain; unusually weak or tired; yellowing of the eyes or skin swelling of ankles, feet, hands Side effects that usually do not require medical attention (report to your doctor or health care professional if they continue or are bothersome): constipation diarrhea hair loss loss of appetite nausea rash vomiting This list may not describe all possible side effects. Call your doctor for medical advice about side effects. You may report side effects to FDA at 1-800-FDA-1088. Where should I keep my medication? This drug is given in a hospital or clinic and will not be stored at home. NOTE: This sheet is a summary. It may not cover all possible information. If you have questions about this medicine, talk to your doctor, pharmacist, or health care provider.   2022 Elsevier/Gold Standard (2017-04-16 18:06:11)  Cisplatin injection What is this medication? CISPLATIN (SIS pla tin) is a chemotherapy drug. It targets fast dividing cells, like cancer cells, and causes these cells to die. This medicine is used to treat many types of cancer like bladder, ovarian, and testicular cancers. This medicine may be used for other purposes; ask your health care provider or pharmacist if you have questions. COMMON BRAND NAME(S): Platinol, Platinol -AQ What should I tell my care team before I take this medication? They need to know if you have any of these conditions: eye disease, vision problems hearing problems kidney disease low blood counts, like white cells, platelets, or red blood cells tingling of the fingers or toes, or other nerve disorder an unusual or allergic reaction to cisplatin, carboplatin, oxaliplatin, other medicines, foods, dyes, or preservatives pregnant or trying to get pregnant breast-feeding How should I use this medication? This drug is given as an  infusion into a vein. It is administered in a hospital or clinic by a specially trained health care professional. Talk to your pediatrician regarding the use of this medicine in children. Special care may be needed. Overdosage: If you think you have taken too much of this medicine contact a poison control center or emergency room at once. NOTE: This medicine is only for you. Do not share this medicine with others. What if I miss a dose? It is important not to miss a dose. Call your doctor or health care professional if you are unable to keep an appointment. What may interact with this medication? This medicine may interact with the following medications: foscarnet certain antibiotics like amikacin, gentamicin, neomycin, polymyxin B, streptomycin, tobramycin, vancomycin This list may not describe all possible interactions. Give your health care provider a list of all the medicines, herbs,  non-prescription drugs, or dietary supplements you use. Also tell them if you smoke, drink alcohol, or use illegal drugs. Some items may interact with your medicine. What should I watch for while using this medication? Your condition will be monitored carefully while you are receiving this medicine. You will need important blood work done while you are taking this medicine. This drug may make you feel generally unwell. This is not uncommon, as chemotherapy can affect healthy cells as well as cancer cells. Report any side effects. Continue your course of treatment even though you feel ill unless your doctor tells you to stop. This medicine may increase your risk of getting an infection. Call your healthcare professional for advice if you get a fever, chills, or sore throat, or other symptoms of a cold or flu. Do not treat yourself. Try to avoid being around people who are sick. Avoid taking medicines that contain aspirin, acetaminophen, ibuprofen, naproxen, or ketoprofen unless instructed by your healthcare professional. These medicines may hide a fever. This medicine may increase your risk to bruise or bleed. Call your doctor or health care professional if you notice any unusual bleeding. Be careful brushing and flossing your teeth or using a toothpick because you may get an infection or bleed more easily. If you have any dental work done, tell your dentist you are receiving this medicine. Do not become pregnant while taking this medicine or for 14 months after stopping it. Women should inform their healthcare professional if they wish to become pregnant or think they might be pregnant. Men should not father a child while taking this medicine and for 11 months after stopping it. There is potential for serious side effects to an unborn child. Talk to your healthcare professional for more information. Do not breast-feed an infant while taking this medicine. This medicine has caused ovarian failure in some  women. This medicine may make it more difficult to get pregnant. Talk to your healthcare professional if you are concerned about your fertility. This medicine has caused decreased sperm counts in some men. This may make it more difficult to father a child. Talk to your healthcare professional if you are concerned about your fertility. Drink fluids as directed while you are taking this medicine. This will help protect your kidneys. Call your doctor or health care professional if you get diarrhea. Do not treat yourself. What side effects may I notice from receiving this medication? Side effects that you should report to your doctor or health care professional as soon as possible: allergic reactions like skin rash, itching or hives, swelling of the face, lips, or tongue blurred vision changes in vision  decreased hearing or ringing of the ears nausea, vomiting pain, redness, or irritation at site where injected pain, tingling, numbness in the hands or feet signs and symptoms of bleeding such as bloody or black, tarry stools; red or dark brown urine; spitting up blood or brown material that looks like coffee grounds; red spots on the skin; unusual bruising or bleeding from the eyes, gums, or nose signs and symptoms of infection like fever; chills; cough; sore throat; pain or trouble passing urine signs and symptoms of kidney injury like trouble passing urine or change in the amount of urine signs and symptoms of low red blood cells or anemia such as unusually weak or tired; feeling faint or lightheaded; falls; breathing problems Side effects that usually do not require medical attention (report to your doctor or health care professional if they continue or are bothersome): loss of appetite mouth sores muscle cramps This list may not describe all possible side effects. Call your doctor for medical advice about side effects. You may report side effects to FDA at 1-800-FDA-1088. Where should I keep my  medication? This drug is given in a hospital or clinic and will not be stored at home. NOTE: This sheet is a summary. It may not cover all possible information. If you have questions about this medicine, talk to your doctor, pharmacist, or health care provider.  2022 Elsevier/Gold Standard (2018-01-16 15:59:17)

## 2020-12-04 NOTE — Progress Notes (Signed)
Patient presents for treatment. RN assessment completed along with the following:  Labs/vitals reviewed - Yes, and within treatment parameters.   Weight within 10% of previous measurement - Yes Oncology Treatment Attestation completed for current therapy- Yes, on date 02/01/2020 Informed consent completed and reflects current therapy/intent - Yes, on date 02/04/2020 Gemzar/Cisplatin; 06/12/2020 Imfinzi.             Provider progress note reviewed - Yes, today's provider note was reviewed. Treatment/Antibody/Supportive plan reviewed - Yes, and there are no adjustments needed for today's treatment. S&H and other orders reviewed - Yes, and there are no additional orders identified. Previous treatment date reviewed - Yes, and the appropriate amount of time has elapsed between treatments. Clinic Hand Off Received from - Yes, Merceda Elks, RN.  Patient to proceed with treatment.

## 2020-12-04 NOTE — Progress Notes (Signed)
[facility] OFFICE PROGRESS NOTE   Diagnosis: Cholangiocarcinoma  INTERVAL HISTORY:   [name] completed another cycle of gemcitabine/cisplatin/Durvalumab beginning 11/13/2020.  No nausea/vomiting, mouth sores, fever, or rash.  He has persistent peripheral numbness.  This has not changed.  He has occasional constipation.  He relates back pain today to sleeping on the sofa last night.  Objective:  Vital signs in last 24 hours:  Blood pressure (!) 154/86, pulse 84, temperature 98.1 F (36.7 C), temperature source Oral, resp. rate 20, height _0  (1.88 m), weight 241 lb 9.6 oz (109.6 kg), SpO2 100 %.    HEENT: No thrush or ulcers Resp: Lungs clear bilaterally Cardio: Regular rate and rhythm GI: No hepatosplenomegaly, mild tenderness in the right upper abdomen Vascular: No leg edema    Portacath/PICC-without erythema  Lab Results:  Lab Results  Component Value Date   WBC 6.6 12/04/2020   HGB 9.1 (L) 12/04/2020   HCT 27.3 (L) 12/04/2020   MCV 106.6 (H) 12/04/2020   PLT 131 (L) 12/04/2020   NEUTROABS 4.6 12/04/2020    CMP  Lab Results  Component Value Date   NA 137 11/20/2020   K 4.2 11/20/2020   CL 102 11/20/2020   CO2 28 11/20/2020   GLUCOSE 205 (H) 11/20/2020   BUN 17 11/20/2020   CREATININE 0.83 11/20/2020   CALCIUM 9.1 11/20/2020   PROT 6.7 11/20/2020   ALBUMIN 3.7 11/20/2020   AST 30 11/20/2020   ALT 24 11/20/2020   ALKPHOS 139 (H) 11/20/2020   BILITOT 0.4 11/20/2020   GFRNONAA >60 11/20/2020   GFRAA 127 01/11/2020    Lab Results  Component Value Date   CEA1 2.15 01/14/2020   ONG295 82 (H) 08/21/2020    Medications: I have reviewed the patient's current medications.   Assessment/Plan: Cholangiocarcinoma  multiple liver masses and abdominal lymphadenopathy CTs 01/13/2020-rounded hypodense mass appears to arise from the pancreas neck, multiple rim-enhancing masses in the liver, primarily left liver with segmental dilation of the  left lobe Intermatic bile ducts, ill-defined hypodensity the central liver with effacement of the left portal vein, enlarged portacaval and retroperitoneal lymph nodes Ultrasound-guided biopsy of the left liver lesion 01/19/2020-adenocarcinoma, cytokeratin 7+, MSS, tumor mutation burden 1, IDH1 R132C MRI abdomen 01/31/2020-poorly marginated central liver mass, multiple smaller similar satellite liver masses, extrinsic mass-effect at the biliary hilum with intrahepatic biliary ductal dilatation throughout the left liver with mild Intermatic dilatation the superior right liver, no pancreas mass or ductal dilatation, normal spleen size, numerous enlarged enhancing lymph nodes at the porta hepatis, peripancreatic, portacaval, aortocaval, left periaortic chains Cycle 1 gemcitabine/cisplatin 02/04/2020 Cycle 2 gemcitabine/cisplatin 02/28/2020 Cycle 3 gemcitabine/cisplatin 03/20/2020 CT abdomen/pelvis 03/31/2020-mild decrease in central liver tumor, mild decrease in size of liver metastases and upper abdominal adenopathy, new splenomegaly Cycle 4 gemcitabine/cisplatin 04/10/2020 Cycle 5 gemcitabine/cisplatin 05/01/2020 Cycle 6 gemcitabine/cisplatin plus durvalumab 05/22/2020 CTs 06/06/2020- dominant central liver mass mildly enlarged, other liver lesions and abdominal adenopathy is stable, no evidence of metastatic disease to the chest Cycle 7 gemcitabine/cisplatin plus Durvalumab 06/12/2020 Cycle 8 gemcitabine/cisplatin plus Durvalumab 06/30/2020 Cycle 9 gemcitabine/cisplatin plus Durvalumab 07/31/2020 CT abdomen/pelvis 08/20/2020-decreased size of dominant central liver mass, slight increase in size and stable additional liver lesions, stable portacaval node Cycle 10 gemcitabine/cisplatin plus Durvalumab 08/21/2020 Cycle 11 gemcitabine/cisplatin plus Durvalumab 09/11/2020 Cycle 12 gemcitabine/cisplatin plus Durvalumab 10/02/2020 Cycle 13 gemcitabine/cisplatin plus Durvalumab 10/23/2020 CT abdomen/pelvis 11/10/2020-no  change in dominant central liver mass and additional liver lesions, stable upper retroperitoneal adenopathy Cycle 14 gemcitabine/cisplatin  plus Durvalumab 11/13/2020 Cycle 15 gemcitabine/cisplatin plus Durvalumab 12/04/2020   Cough-likely related to diaphragmatic irritation from #1 Anorexia/weight loss Hypercalcemia-likely hypercalcemia malignancy, status post intravenous hydration and Zometa 01/14/2020 Diabetes Hyperlipidemia Hypertension History of peripheral neuropathy secondary to diabetes Severe back pain following Fulphila-oxycodone prescribed Thrombocytopenia secondary to chemotherapy-gemcitabine held with day 1 cycle 8 and then dose reduced COVID-19 infection 07/22/2020        Disposition: Matthew Osborne appears stable.  He will complete another cycle of gemcitabine/cisplatin plus Durvalumab beginning today.  He will return for an office visit and chemotherapy in 2 weeks.  Betsy Coder, MD  12/04/2020  8:47 AM

## 2020-12-11 ENCOUNTER — Inpatient Hospital Stay: Payer: No Typology Code available for payment source | Attending: Oncology

## 2020-12-11 ENCOUNTER — Other Ambulatory Visit (HOSPITAL_BASED_OUTPATIENT_CLINIC_OR_DEPARTMENT_OTHER): Payer: Self-pay

## 2020-12-11 ENCOUNTER — Inpatient Hospital Stay: Payer: No Typology Code available for payment source

## 2020-12-11 ENCOUNTER — Other Ambulatory Visit: Payer: Self-pay

## 2020-12-11 VITALS — BP 155/88 | HR 66 | Temp 98.0°F | Resp 20 | Ht 74.0 in | Wt 240.4 lb

## 2020-12-11 DIAGNOSIS — C221 Intrahepatic bile duct carcinoma: Secondary | ICD-10-CM

## 2020-12-11 DIAGNOSIS — Z5111 Encounter for antineoplastic chemotherapy: Secondary | ICD-10-CM | POA: Insufficient documentation

## 2020-12-11 DIAGNOSIS — Z5189 Encounter for other specified aftercare: Secondary | ICD-10-CM | POA: Diagnosis not present

## 2020-12-11 LAB — CBC WITH DIFFERENTIAL (CANCER CENTER ONLY)
Abs Immature Granulocytes: 0 10*3/uL (ref 0.00–0.07)
Basophils Absolute: 0.1 10*3/uL (ref 0.0–0.1)
Basophils Relative: 2 %
Eosinophils Absolute: 0.1 10*3/uL (ref 0.0–0.5)
Eosinophils Relative: 3 %
HCT: 26.5 % — ABNORMAL LOW (ref 39.0–52.0)
Hemoglobin: 8.8 g/dL — ABNORMAL LOW (ref 13.0–17.0)
Immature Granulocytes: 0 %
Lymphocytes Relative: 25 %
Lymphs Abs: 0.7 10*3/uL (ref 0.7–4.0)
MCH: 35.1 pg — ABNORMAL HIGH (ref 26.0–34.0)
MCHC: 33.2 g/dL (ref 30.0–36.0)
MCV: 105.6 fL — ABNORMAL HIGH (ref 80.0–100.0)
Monocytes Absolute: 0.6 10*3/uL (ref 0.1–1.0)
Monocytes Relative: 22 %
Neutro Abs: 1.4 10*3/uL — ABNORMAL LOW (ref 1.7–7.7)
Neutrophils Relative %: 48 %
Platelet Count: 129 10*3/uL — ABNORMAL LOW (ref 150–400)
RBC: 2.51 MIL/uL — ABNORMAL LOW (ref 4.22–5.81)
RDW: 14.3 % (ref 11.5–15.5)
WBC Count: 2.9 10*3/uL — ABNORMAL LOW (ref 4.0–10.5)
nRBC: 0 % (ref 0.0–0.2)

## 2020-12-11 LAB — CMP (CANCER CENTER ONLY)
ALT: 24 U/L (ref 0–44)
AST: 32 U/L (ref 15–41)
Albumin: 3.8 g/dL (ref 3.5–5.0)
Alkaline Phosphatase: 149 U/L — ABNORMAL HIGH (ref 38–126)
Anion gap: 4 — ABNORMAL LOW (ref 5–15)
BUN: 18 mg/dL (ref 6–20)
CO2: 30 mmol/L (ref 22–32)
Calcium: 8.9 mg/dL (ref 8.9–10.3)
Chloride: 102 mmol/L (ref 98–111)
Creatinine: 0.85 mg/dL (ref 0.61–1.24)
GFR, Estimated: >60 mL/min (ref >60)
Glucose, Bld: 166 mg/dL — ABNORMAL HIGH (ref 70–99)
Potassium: 4.6 mmol/L (ref 3.5–5.1)
Sodium: 136 mmol/L (ref 135–145)
Total Bilirubin: 0.5 mg/dL (ref 0.3–1.2)
Total Protein: 7 g/dL (ref 6.5–8.1)

## 2020-12-11 LAB — MAGNESIUM: Magnesium: 1.9 mg/dL (ref 1.7–2.4)

## 2020-12-11 MED ORDER — SODIUM CHLORIDE 0.9% FLUSH
10.0000 mL | INTRAVENOUS | Status: DC | PRN
  Administered 2020-12-11: 10 mL

## 2020-12-11 MED ORDER — SODIUM CHLORIDE 0.9 % IV SOLN: Freq: Once | INTRAVENOUS | Status: AC

## 2020-12-11 MED ORDER — PALONOSETRON HCL INJECTION 0.25 MG/5ML
0.2500 mg | Freq: Once | INTRAVENOUS | Status: AC
  Administered 2020-12-11: 0.25 mg via INTRAVENOUS
  Filled 2020-12-11: qty 5

## 2020-12-11 MED ORDER — DEXAMETHASONE SODIUM PHOSPHATE 10 MG/ML IJ SOLN
5.0000 mg | Freq: Once | INTRAMUSCULAR | Status: AC
  Administered 2020-12-11: 5 mg via INTRAVENOUS
  Filled 2020-12-11: qty 1

## 2020-12-11 MED ORDER — SODIUM CHLORIDE 0.9 % IV SOLN
800.0000 mg/m2 | Freq: Once | INTRAVENOUS | Status: AC
  Administered 2020-12-11: 1862 mg via INTRAVENOUS
  Filled 2020-12-11: qty 48.97

## 2020-12-11 MED ORDER — MAGNESIUM SULFATE 2 GM/50ML IV SOLN
2.0000 g | Freq: Once | INTRAVENOUS | Status: AC
  Administered 2020-12-11: 2 g via INTRAVENOUS
  Filled 2020-12-11: qty 50

## 2020-12-11 MED ORDER — SODIUM CHLORIDE 0.9 % IV SOLN
25.0000 mg/m2 | Freq: Once | INTRAVENOUS | Status: AC
  Administered 2020-12-11: 59 mg via INTRAVENOUS
  Filled 2020-12-11: qty 59

## 2020-12-11 MED ORDER — POTASSIUM CHLORIDE IN NACL 20-0.9 MEQ/L-% IV SOLN
Freq: Once | INTRAVENOUS | Status: AC
  Filled 2020-12-11: qty 1000

## 2020-12-11 MED ORDER — SODIUM CHLORIDE 0.9 % IV SOLN
150.0000 mg | Freq: Once | INTRAVENOUS | Status: AC
  Administered 2020-12-11: 150 mg via INTRAVENOUS
  Filled 2020-12-11: qty 5

## 2020-12-11 MED ORDER — FAMOTIDINE 20 MG IN NS 100 ML IVPB
20.0000 mg | Freq: Once | INTRAVENOUS | Status: AC
  Administered 2020-12-11: 20 mg via INTRAVENOUS
  Filled 2020-12-11: qty 100

## 2020-12-11 MED ORDER — HEPARIN SOD (PORK) LOCK FLUSH 100 UNIT/ML IV SOLN
500.0000 [IU] | Freq: Once | INTRAVENOUS | Status: AC | PRN
  Administered 2020-12-11: 500 [IU]

## 2020-12-11 MED FILL — Insulin Pen Needle 31 G X 5 MM (1/5" or 3/16"): 25 days supply | Qty: 100 | Fill #6 | Status: AC

## 2020-12-11 MED FILL — Insulin Lispro Soln Pen-injector 100 Unit/ML (1 Unit Dial): SUBCUTANEOUS | 82 days supply | Qty: 30 | Fill #3 | Status: AC

## 2020-12-11 NOTE — Progress Notes (Signed)
Patient presents for treatment. RN assessment completed along with the following:  Labs/vitals reviewed - Yes, and ANC 1.4, okay to treat per Dr. Benay Spice; Pt. Is getting Neulasta injection tomorrow.    Weight within 10% of previous measurement - Yes Oncology Treatment Attestation completed for current therapy- Yes, on date 02/01/2020 Informed consent completed and reflects current therapy/intent - Yes, on date 01/25/2020             Provider progress note reviewed - Patient not seen by provider today. Most recent note dated 12/04/2020 reviewed. Treatment/Antibody/Supportive plan reviewed - Yes, and there are no adjustments needed for today's treatment. S&H and other orders reviewed - Yes, and there are no additional orders identified. Previous treatment date reviewed - Yes, and the appropriate amount of time has elapsed between treatments. Clinic Hand Off Received from - No.  Patient to proceed with treatment.

## 2020-12-11 NOTE — Patient Instructions (Signed)
[facility]   Discharge Instructions: Thank you for choosing [facility] to provide your oncology and hematology care.   If you have a lab appointment with the [facility], please go directly to the [facility] and check in at the registration area.   Wear comfortable clothing and clothing appropriate for easy access to any Portacath or PICC line.   We strive to give you quality time with your provider. You may need to reschedule your appointment if you arrive late (15 or more minutes).  Arriving late affects you and other patients whose appointments are after yours.  Also, if you miss three or more appointments without notifying the office, you may be dismissed from the clinic at the provider's discretion.      For prescription refill requests, have your pharmacy contact our office and allow 72 hours for refills to be completed.    Today you received the following chemotherapy and/or immunotherapy agents Gemcitabine (GEMZAR) & Cisplatin (PLATINOL).      To help prevent nausea and vomiting after your treatment, we encourage you to take your nausea medication as directed.  BELOW ARE SYMPTOMS THAT SHOULD BE REPORTED IMMEDIATELY: *FEVER GREATER THAN 100.4 F (38 C) OR HIGHER *CHILLS OR SWEATING *NAUSEA AND VOMITING THAT IS NOT CONTROLLED WITH YOUR NAUSEA MEDICATION *UNUSUAL SHORTNESS OF BREATH *UNUSUAL BRUISING OR BLEEDING *URINARY PROBLEMS (pain or burning when urinating, or frequent urination) *BOWEL PROBLEMS (unusual diarrhea, constipation, pain near the anus) TENDERNESS IN MOUTH AND THROAT WITH OR WITHOUT PRESENCE OF ULCERS (sore throat, sores in mouth, or a toothache) UNUSUAL RASH, SWELLING OR PAIN  UNUSUAL VAGINAL DISCHARGE OR ITCHING   Items with * indicate a potential emergency and should be followed up as soon as possible or go to the Emergency Department if any problems should occur.  Please show the CHEMOTHERAPY ALERT CARD or  IMMUNOTHERAPY ALERT CARD at check-in to the Emergency Department and triage nurse.  Should you have questions after your visit or need to cancel or reschedule your appointment, please contact [facility]  Dept: [phone]  and follow the prompts.  Office hours are 8:00 a.m. to 4:30 p.m. Monday - Friday. Please note that voicemails left after 4:00 p.m. may not be returned until the following business day.  We are closed weekends and major holidays. You have access to a nurse at all times for urgent questions. Please call the main number to the clinic Dept: [phone] and follow the prompts.   For any non-urgent questions, you may also contact your provider using MyChart. We now offer e-Visits for anyone 73 and older to request care online for non-urgent symptoms. For details visit [email or web address].   Also download the MyChart app! Go to the app store, search "MyChart", open the app, select [facility], and log in with your MyChart username and password.  Due to Covid, a mask is required upon entering the hospital/clinic. If you do not have a mask, one will be given to you upon arrival. For doctor visits, patients may have 1 support person aged 51 or older with them. For treatment visits, patients cannot have anyone with them due to current Covid guidelines and our immunocompromised population.   Gemcitabine injection What is this medication? GEMCITABINE (jem SYE ta been) is a chemotherapy drug. This medicine is used to treat many types of cancer like breast cancer, lung cancer, pancreatic cancer, and ovarian cancer. This medicine may be used for other purposes; ask  your health care provider or pharmacist if you have questions. COMMON BRAND NAME(S): Gemzar, Infugem What should I tell my care team before I take this medication? They need to know if you have any of these conditions: blood disorders infection kidney disease liver disease lung or breathing  disease, like asthma recent or ongoing radiation therapy an unusual or allergic reaction to gemcitabine, other chemotherapy, other medicines, foods, dyes, or preservatives pregnant or trying to get pregnant breast-feeding How should I use this medication? This drug is given as an infusion into a vein. It is administered in a hospital or clinic by a specially trained health care professional. Talk to your pediatrician regarding the use of this medicine in children. Special care may be needed. Overdosage: If you think you have taken too much of this medicine contact a poison control center or emergency room at once. NOTE: This medicine is only for you. Do not share this medicine with others. What if I miss a dose? It is important not to miss your dose. Call your doctor or health care professional if you are unable to keep an appointment. What may interact with this medication? medicines to increase blood counts like filgrastim, pegfilgrastim, sargramostim some other chemotherapy drugs like cisplatin vaccines Talk to your doctor or health care professional before taking any of these medicines: acetaminophen aspirin ibuprofen ketoprofen naproxen This list may not describe all possible interactions. Give your health care provider a list of all the medicines, herbs, non-prescription drugs, or dietary supplements you use. Also tell them if you smoke, drink alcohol, or use illegal drugs. Some items may interact with your medicine. What should I watch for while using this medication? Visit your doctor for checks on your progress. This drug may make you feel generally unwell. This is not uncommon, as chemotherapy can affect healthy cells as well as cancer cells. Report any side effects. Continue your course of treatment even though you feel ill unless your doctor tells you to stop. In some cases, you may be given additional medicines to help with side effects. Follow all directions for their  use. Call your doctor or health care professional for advice if you get a fever, chills or sore throat, or other symptoms of a cold or flu. Do not treat yourself. This drug decreases your body's ability to fight infections. Try to avoid being around people who are sick. This medicine may increase your risk to bruise or bleed. Call your doctor or health care professional if you notice any unusual bleeding. Be careful brushing and flossing your teeth or using a toothpick because you may get an infection or bleed more easily. If you have any dental work done, tell your dentist you are receiving this medicine. Avoid taking products that contain aspirin, acetaminophen, ibuprofen, naproxen, or ketoprofen unless instructed by your doctor. These medicines may hide a fever. Do not become pregnant while taking this medicine or for 6 months after stopping it. Women should inform their doctor if they wish to become pregnant or think they might be pregnant. Men should not father a child while taking this medicine and for 3 months after stopping it. There is a potential for serious side effects to an unborn child. Talk to your health care professional or pharmacist for more information. Do not breast-feed an infant while taking this medicine or for at least 1 week after stopping it. Men should inform their doctors if they wish to father a child. This medicine may lower sperm counts. Talk with  your doctor or health care professional if you are concerned about your fertility. What side effects may I notice from receiving this medication? Side effects that you should report to your doctor or health care professional as soon as possible: allergic reactions like skin rash, itching or hives, swelling of the face, lips, or tongue breathing problems pain, redness, or irritation at site where injected signs and symptoms of a dangerous change in heartbeat or heart rhythm like chest pain; dizziness; fast or irregular heartbeat;  palpitations; feeling faint or lightheaded, falls; breathing problems signs of decreased platelets or bleeding - bruising, pinpoint red spots on the skin, black, tarry stools, blood in the urine signs of decreased red blood cells - unusually weak or tired, feeling faint or lightheaded, falls signs of infection - fever or chills, cough, sore throat, pain or difficulty passing urine signs and symptoms of kidney injury like trouble passing urine or change in the amount of urine signs and symptoms of liver injury like dark yellow or brown urine; general ill feeling or flu-like symptoms; light-colored stools; loss of appetite; nausea; right upper belly pain; unusually weak or tired; yellowing of the eyes or skin swelling of ankles, feet, hands Side effects that usually do not require medical attention (report to your doctor or health care professional if they continue or are bothersome): constipation diarrhea hair loss loss of appetite nausea rash vomiting This list may not describe all possible side effects. Call your doctor for medical advice about side effects. You may report side effects to FDA at 1-800-FDA-1088. Where should I keep my medication? This drug is given in a hospital or clinic and will not be stored at home. NOTE: This sheet is a summary. It may not cover all possible information. If you have questions about this medicine, talk to your doctor, pharmacist, or health care provider.  2022 Elsevier/Gold Standard (2017-04-16 00:00:00)  Cisplatin injection What is this medication? CISPLATIN (SIS pla tin) is a chemotherapy drug. It targets fast dividing cells, like cancer cells, and causes these cells to die. This medicine is used to treat many types of cancer like bladder, ovarian, and testicular cancers. This medicine may be used for other purposes; ask your health care provider or pharmacist if you have questions. COMMON BRAND NAME(S): Platinol, Platinol -AQ What should I tell my  care team before I take this medication? They need to know if you have any of these conditions: eye disease, vision problems hearing problems kidney disease low blood counts, like white cells, platelets, or red blood cells tingling of the fingers or toes, or other nerve disorder an unusual or allergic reaction to cisplatin, carboplatin, oxaliplatin, other medicines, foods, dyes, or preservatives pregnant or trying to get pregnant breast-feeding How should I use this medication? This drug is given as an infusion into a vein. It is administered in a hospital or clinic by a specially trained health care professional. Talk to your pediatrician regarding the use of this medicine in children. Special care may be needed. Overdosage: If you think you have taken too much of this medicine contact a poison control center or emergency room at once. NOTE: This medicine is only for you. Do not share this medicine with others. What if I miss a dose? It is important not to miss a dose. Call your doctor or health care professional if you are unable to keep an appointment. What may interact with this medication? This medicine may interact with the following medications: foscarnet certain antibiotics like amikacin,  gentamicin, neomycin, polymyxin B, streptomycin, tobramycin, vancomycin This list may not describe all possible interactions. Give your health care provider a list of all the medicines, herbs, non-prescription drugs, or dietary supplements you use. Also tell them if you smoke, drink alcohol, or use illegal drugs. Some items may interact with your medicine. What should I watch for while using this medication? Your condition will be monitored carefully while you are receiving this medicine. You will need important blood work done while you are taking this medicine. This drug may make you feel generally unwell. This is not uncommon, as chemotherapy can affect healthy cells as well as cancer cells.  Report any side effects. Continue your course of treatment even though you feel ill unless your doctor tells you to stop. This medicine may increase your risk of getting an infection. Call your healthcare professional for advice if you get a fever, chills, or sore throat, or other symptoms of a cold or flu. Do not treat yourself. Try to avoid being around people who are sick. Avoid taking medicines that contain aspirin, acetaminophen, ibuprofen, naproxen, or ketoprofen unless instructed by your healthcare professional. These medicines may hide a fever. This medicine may increase your risk to bruise or bleed. Call your doctor or health care professional if you notice any unusual bleeding. Be careful brushing and flossing your teeth or using a toothpick because you may get an infection or bleed more easily. If you have any dental work done, tell your dentist you are receiving this medicine. Do not become pregnant while taking this medicine or for 14 months after stopping it. Women should inform their healthcare professional if they wish to become pregnant or think they might be pregnant. Men should not father a child while taking this medicine and for 11 months after stopping it. There is potential for serious side effects to an unborn child. Talk to your healthcare professional for more information. Do not breast-feed an infant while taking this medicine. This medicine has caused ovarian failure in some women. This medicine may make it more difficult to get pregnant. Talk to your healthcare professional if you are concerned about your fertility. This medicine has caused decreased sperm counts in some men. This may make it more difficult to father a child. Talk to your healthcare professional if you are concerned about your fertility. Drink fluids as directed while you are taking this medicine. This will help protect your kidneys. Call your doctor or health care professional if you get diarrhea. Do not treat  yourself. What side effects may I notice from receiving this medication? Side effects that you should report to your doctor or health care professional as soon as possible: allergic reactions like skin rash, itching or hives, swelling of the face, lips, or tongue blurred vision changes in vision decreased hearing or ringing of the ears nausea, vomiting pain, redness, or irritation at site where injected pain, tingling, numbness in the hands or feet signs and symptoms of bleeding such as bloody or black, tarry stools; red or dark brown urine; spitting up blood or brown material that looks like coffee grounds; red spots on the skin; unusual bruising or bleeding from the eyes, gums, or nose signs and symptoms of infection like fever; chills; cough; sore throat; pain or trouble passing urine signs and symptoms of kidney injury like trouble passing urine or change in the amount of urine signs and symptoms of low red blood cells or anemia such as unusually weak or tired; feeling faint or  lightheaded; falls; breathing problems Side effects that usually do not require medical attention (report to your doctor or health care professional if they continue or are bothersome): loss of appetite mouth sores muscle cramps This list may not describe all possible side effects. Call your doctor for medical advice about side effects. You may report side effects to FDA at 1-800-FDA-1088. Where should I keep my medication? This drug is given in a hospital or clinic and will not be stored at home. NOTE: This sheet is a summary. It may not cover all possible information. If you have questions about this medicine, talk to your doctor, pharmacist, or health care provider.  2022 Elsevier/Gold Standard (2020-10-10 00:00:00)

## 2020-12-12 ENCOUNTER — Inpatient Hospital Stay: Payer: No Typology Code available for payment source

## 2020-12-12 VITALS — BP 150/86 | HR 76 | Temp 98.5°F | Resp 20

## 2020-12-12 DIAGNOSIS — Z5111 Encounter for antineoplastic chemotherapy: Secondary | ICD-10-CM | POA: Diagnosis not present

## 2020-12-12 DIAGNOSIS — C221 Intrahepatic bile duct carcinoma: Secondary | ICD-10-CM

## 2020-12-12 MED ORDER — PEGFILGRASTIM-JMDB 6 MG/0.6ML ~~LOC~~ SOSY
6.0000 mg | PREFILLED_SYRINGE | Freq: Once | SUBCUTANEOUS | Status: AC
  Administered 2020-12-12: 6 mg via SUBCUTANEOUS

## 2020-12-12 NOTE — Patient Instructions (Signed)
[facility]   Discharge Instructions: Thank you for choosing [facility] to provide your oncology and hematology care.   If you have a lab appointment with the [facility], please go directly to the [facility] and check in at the registration area.   Wear comfortable clothing and clothing appropriate for easy access to any Portacath or PICC line.   We strive to give you quality time with your provider. You may need to reschedule your appointment if you arrive late (15 or more minutes).  Arriving late affects you and other patients whose appointments are after yours.  Also, if you miss three or more appointments without notifying the office, you may be dismissed from the clinic at the provider's discretion.      For prescription refill requests, have your pharmacy contact our office and allow 72 hours for refills to be completed.    Today you received the following Filphila      To help prevent nausea and vomiting after your treatment, we encourage you to take your nausea medication as directed.  BELOW ARE SYMPTOMS THAT SHOULD BE REPORTED IMMEDIATELY: *FEVER GREATER THAN 100.4 F (38 C) OR HIGHER *CHILLS OR SWEATING *NAUSEA AND VOMITING THAT IS NOT CONTROLLED WITH YOUR NAUSEA MEDICATION *UNUSUAL SHORTNESS OF BREATH *UNUSUAL BRUISING OR BLEEDING *URINARY PROBLEMS (pain or burning when urinating, or frequent urination) *BOWEL PROBLEMS (unusual diarrhea, constipation, pain near the anus) TENDERNESS IN MOUTH AND THROAT WITH OR WITHOUT PRESENCE OF ULCERS (sore throat, sores in mouth, or a toothache) UNUSUAL RASH, SWELLING OR PAIN  UNUSUAL VAGINAL DISCHARGE OR ITCHING   Items with * indicate a potential emergency and should be followed up as soon as possible or go to the Emergency Department if any problems should occur.  Please show the CHEMOTHERAPY ALERT CARD or IMMUNOTHERAPY ALERT CARD at check-in to the Emergency Department and triage  nurse.  Should you have questions after your visit or need to cancel or reschedule your appointment, please contact [facility]  Dept: [phone]  and follow the prompts.  Office hours are 8:00 a.m. to 4:30 p.m. Monday - Friday. Please note that voicemails left after 4:00 p.m. may not be returned until the following business day.  We are closed weekends and major holidays. You have access to a nurse at all times for urgent questions. Please call the main number to the clinic Dept: [phone] and follow the prompts.   For any non-urgent questions, you may also contact your provider using MyChart. We now offer e-Visits for anyone 73 and older to request care online for non-urgent symptoms. For details visit [email or web address].   Also download the MyChart app! Go to the app store, search "MyChart", open the app, select [facility], and log in with your MyChart username and password.  Due to Covid, a mask is required upon entering the hospital/clinic. If you do not have a mask, one will be given to you upon arrival. For doctor visits, patients may have 1 support person aged 6 or older with them. For treatment visits, patients cannot have anyone with them due to current Covid guidelines and our immunocompromised population.   Pegfilgrastim Injection What is this medication? PEGFILGRASTIM (PEG fil gra stim) lowers the risk of infection in people who are receiving chemotherapy. It works by Building control surveyor make more white blood cells, which protects your body from infection. It may also be used to help people who have been exposed to  high doses of radiation. This medicine may be used for other purposes; ask your health care provider or pharmacist if you have questions. COMMON BRAND NAME(S): Rexene Edison, Ziextenzo What should I tell my care team before I take this medication? They need to know if you have any of these conditions: Kidney  disease Latex allergy Ongoing radiation therapy Sickle cell disease Skin reactions to acrylic adhesives (On-Body Injector only) An unusual or allergic reaction to pegfilgrastim, filgrastim, other medications, foods, dyes, or preservatives Pregnant or trying to get pregnant Breast-feeding How should I use this medication? This medication is for injection under the skin. If you get this medication at home, you will be taught how to prepare and give the pre-filled syringe or how to use the On-body Injector. Refer to the patient Instructions for Use for detailed instructions. Use exactly as directed. Tell your care team immediately if you suspect that the On-body Injector may not have performed as intended or if you suspect the use of the On-body Injector resulted in a missed or partial dose. It is important that you put your used needles and syringes in a special sharps container. Do not put them in a trash can. If you do not have a sharps container, call your pharmacist or care team to get one. Talk to your care team about the use of this medication in children. While this medication may be prescribed for selected conditions, precautions do apply. Overdosage: If you think you have taken too much of this medicine contact a poison control center or emergency room at once. NOTE: This medicine is only for you. Do not share this medicine with others. What if I miss a dose? It is important not to miss your dose. Call your care team if you miss your dose. If you miss a dose due to an On-body Injector failure or leakage, a new dose should be administered as soon as possible using a single prefilled syringe for manual use. What may interact with this medication? Interactions have not been studied. This list may not describe all possible interactions. Give your health care provider a list of all the medicines, herbs, non-prescription drugs, or dietary supplements you use. Also tell them if you smoke, drink  alcohol, or use illegal drugs. Some items may interact with your medicine. What should I watch for while using this medication? Your condition will be monitored carefully while you are receiving this medication. You may need blood work done while you are taking this medication. Talk to your care team about your risk of cancer. You may be more at risk for certain types of cancer if you take this medication. If you are going to need a MRI, CT scan, or other procedure, tell your care team that you are using this medication (On-Body Injector only). What side effects may I notice from receiving this medication? Side effects that you should report to your care team as soon as possible: Allergic reactions--skin rash, itching, hives, swelling of the face, lips, tongue, or throat Capillary leak syndrome--stomach or muscle pain, unusual weakness or fatigue, feeling faint or lightheaded, decrease in the amount of urine, swelling of the ankles, hands, or feet, trouble breathing High white blood cell level--fever, fatigue, trouble breathing, night sweats, change in vision, weight loss Inflammation of the aorta--fever, fatigue, back, chest, or stomach pain, severe headache Kidney injury (glomerulonephritis)--decrease in the amount of urine, red or dark brown urine, foamy or bubbly urine, swelling of the ankles, hands, or feet Shortness  of breath or trouble breathing Spleen injury--pain in upper left stomach or shoulder Unusual bruising or bleeding Side effects that usually do not require medical attention (report to your care team if they continue or are bothersome): Bone pain Pain in the hands or feet This list may not describe all possible side effects. Call your doctor for medical advice about side effects. You may report side effects to FDA at 1-800-FDA-1088. Where should I keep my medication? Keep out of the reach of children. If you are using this medication at home, you will be instructed on how to  store it. Throw away any unused medication after the expiration date on the label. NOTE: This sheet is a summary. It may not cover all possible information. If you have questions about this medicine, talk to your doctor, pharmacist, or health care provider.  2022 Elsevier/Gold Standard (2020-10-10 00:00:00)

## 2020-12-15 ENCOUNTER — Other Ambulatory Visit: Payer: Self-pay | Admitting: Family Medicine

## 2020-12-15 DIAGNOSIS — E1165 Type 2 diabetes mellitus with hyperglycemia: Secondary | ICD-10-CM

## 2020-12-20 ENCOUNTER — Encounter: Payer: Self-pay | Admitting: *Deleted

## 2020-12-20 NOTE — Progress Notes (Signed)
Faxed last office note, labs and next appointment to [facility] att: Wilford Sports, LPN #[phone].

## 2020-12-24 ENCOUNTER — Other Ambulatory Visit: Payer: Self-pay | Admitting: Oncology

## 2020-12-25 ENCOUNTER — Inpatient Hospital Stay: Payer: No Typology Code available for payment source

## 2020-12-25 ENCOUNTER — Emergency Department (HOSPITAL_BASED_OUTPATIENT_CLINIC_OR_DEPARTMENT_OTHER): Payer: No Typology Code available for payment source

## 2020-12-25 ENCOUNTER — Other Ambulatory Visit: Payer: Self-pay

## 2020-12-25 ENCOUNTER — Observation Stay (HOSPITAL_BASED_OUTPATIENT_CLINIC_OR_DEPARTMENT_OTHER)
Admission: EM | Admit: 2020-12-25 | Discharge: 2020-12-26 | Disposition: A | Discharge status: Home / Self Care | Payer: No Typology Code available for payment source | Attending: Internal Medicine | Admitting: Internal Medicine

## 2020-12-25 ENCOUNTER — Inpatient Hospital Stay: Payer: No Typology Code available for payment source | Admitting: Nurse Practitioner

## 2020-12-25 ENCOUNTER — Other Ambulatory Visit (HOSPITAL_BASED_OUTPATIENT_CLINIC_OR_DEPARTMENT_OTHER): Payer: Self-pay

## 2020-12-25 ENCOUNTER — Encounter (HOSPITAL_BASED_OUTPATIENT_CLINIC_OR_DEPARTMENT_OTHER): Payer: Self-pay

## 2020-12-25 DIAGNOSIS — E871 Hypo-osmolality and hyponatremia: Secondary | ICD-10-CM | POA: Insufficient documentation

## 2020-12-25 DIAGNOSIS — I10 Essential (primary) hypertension: Secondary | ICD-10-CM | POA: Insufficient documentation

## 2020-12-25 DIAGNOSIS — Z794 Long term (current) use of insulin: Secondary | ICD-10-CM | POA: Insufficient documentation

## 2020-12-25 DIAGNOSIS — E119 Type 2 diabetes mellitus without complications: Secondary | ICD-10-CM | POA: Insufficient documentation

## 2020-12-25 DIAGNOSIS — Z87891 Personal history of nicotine dependence: Secondary | ICD-10-CM | POA: Insufficient documentation

## 2020-12-25 DIAGNOSIS — E872 Acidosis, unspecified: Secondary | ICD-10-CM

## 2020-12-25 DIAGNOSIS — Z79899 Other long term (current) drug therapy: Secondary | ICD-10-CM | POA: Diagnosis not present

## 2020-12-25 DIAGNOSIS — R509 Fever, unspecified: Secondary | ICD-10-CM | POA: Diagnosis present

## 2020-12-25 DIAGNOSIS — Z5111 Encounter for antineoplastic chemotherapy: Secondary | ICD-10-CM | POA: Diagnosis not present

## 2020-12-25 DIAGNOSIS — U071 COVID-19: Principal | ICD-10-CM | POA: Diagnosis present

## 2020-12-25 LAB — CBC WITH DIFFERENTIAL/PLATELET
Abs Immature Granulocytes: 0.04 10*3/uL (ref 0.00–0.07)
Basophils Absolute: 0 10*3/uL (ref 0.0–0.1)
Basophils Relative: 1 %
Eosinophils Absolute: 0.2 10*3/uL (ref 0.0–0.5)
Eosinophils Relative: 2 %
HCT: 29 % — ABNORMAL LOW (ref 39.0–52.0)
Hemoglobin: 9.7 g/dL — ABNORMAL LOW (ref 13.0–17.0)
Immature Granulocytes: 1 %
Lymphocytes Relative: 6 %
Lymphs Abs: 0.5 10*3/uL — ABNORMAL LOW (ref 0.7–4.0)
MCH: 34.8 pg — ABNORMAL HIGH (ref 26.0–34.0)
MCHC: 33.4 g/dL (ref 30.0–36.0)
MCV: 103.9 fL — ABNORMAL HIGH (ref 80.0–100.0)
Monocytes Absolute: 1.1 10*3/uL — ABNORMAL HIGH (ref 0.1–1.0)
Monocytes Relative: 13 %
Neutro Abs: 6.8 10*3/uL (ref 1.7–7.7)
Neutrophils Relative %: 77 %
Platelets: 153 10*3/uL (ref 150–400)
RBC: 2.79 MIL/uL — ABNORMAL LOW (ref 4.22–5.81)
RDW: 14.8 % (ref 11.5–15.5)
WBC: 8.7 10*3/uL (ref 4.0–10.5)
nRBC: 0 % (ref 0.0–0.2)

## 2020-12-25 LAB — COMPREHENSIVE METABOLIC PANEL
ALT: 18 U/L (ref 0–44)
AST: 36 U/L (ref 15–41)
Albumin: 3.7 g/dL (ref 3.5–5.0)
Alkaline Phosphatase: 191 U/L — ABNORMAL HIGH (ref 38–126)
Anion gap: 8 (ref 5–15)
BUN: 15 mg/dL (ref 6–20)
CO2: 27 mmol/L (ref 22–32)
Calcium: 9.1 mg/dL (ref 8.9–10.3)
Chloride: 94 mmol/L — ABNORMAL LOW (ref 98–111)
Creatinine, Ser: 0.88 mg/dL (ref 0.61–1.24)
GFR, Estimated: >60 mL/min (ref >60)
Glucose, Bld: 283 mg/dL — ABNORMAL HIGH (ref 70–99)
Potassium: 4 mmol/L (ref 3.5–5.1)
Sodium: 129 mmol/L — ABNORMAL LOW (ref 135–145)
Total Bilirubin: 0.7 mg/dL (ref 0.3–1.2)
Total Protein: 6.9 g/dL (ref 6.5–8.1)

## 2020-12-25 LAB — GLUCOSE, CAPILLARY: Glucose-Capillary: 117 mg/dL — ABNORMAL HIGH (ref 70–99)

## 2020-12-25 LAB — LACTIC ACID, PLASMA
Lactic Acid, Venous: 2.7 mmol/L (ref 0.5–1.9)
Lactic Acid, Venous: 2 mmol/L (ref 0.5–1.9)

## 2020-12-25 LAB — URINALYSIS, ROUTINE W REFLEX MICROSCOPIC
Bilirubin Urine: NEGATIVE
Glucose, UA: >1000 mg/dL — AB
Hgb urine dipstick: NEGATIVE
Ketones, ur: NEGATIVE mg/dL
Leukocytes,Ua: NEGATIVE
Nitrite: NEGATIVE
Protein, ur: NEGATIVE mg/dL
Specific Gravity, Urine: 1.011 (ref 1.005–1.030)
pH: 5 (ref 5.0–8.0)

## 2020-12-25 LAB — RESP PANEL BY RT-PCR (FLU A&B, COVID) ARPGX2
Influenza A by PCR: NEGATIVE
Influenza B by PCR: NEGATIVE
SARS Coronavirus 2 by RT PCR: POSITIVE — AB

## 2020-12-25 LAB — D-DIMER, QUANTITATIVE: D-Dimer, Quant: 0.6 ug/mL-FEU — ABNORMAL HIGH (ref 0.00–0.50)

## 2020-12-25 LAB — CBG MONITORING, ED: Glucose-Capillary: 214 mg/dL — ABNORMAL HIGH (ref 70–99)

## 2020-12-25 LAB — PROCALCITONIN: Procalcitonin: 0.17 ng/mL

## 2020-12-25 LAB — HEMOGLOBIN A1C
Hgb A1c MFr Bld: 7.6 % — ABNORMAL HIGH (ref 4.8–5.6)
Mean Plasma Glucose: 171.42 mg/dL

## 2020-12-25 LAB — MAGNESIUM: Magnesium: 1.7 mg/dL (ref 1.7–2.4)

## 2020-12-25 LAB — C-REACTIVE PROTEIN: CRP: 5.6 mg/dL — ABNORMAL HIGH (ref <1.0)

## 2020-12-25 MED ORDER — MAGNESIUM SULFATE 2 GM/50ML IV SOLN
2.0000 g | Freq: Once | INTRAVENOUS | Status: AC
  Administered 2020-12-25: 2 g via INTRAVENOUS
  Filled 2020-12-25: qty 50

## 2020-12-25 MED ORDER — SODIUM CHLORIDE 0.9 % IV SOLN
2.0000 g | Freq: Three times a day (TID) | INTRAVENOUS | Status: DC
  Filled 2020-12-25: qty 2

## 2020-12-25 MED ORDER — VANCOMYCIN HCL IN DEXTROSE 1-5 GM/200ML-% IV SOLN: 1000.0000 mg | Freq: Once | INTRAVENOUS | Status: DC

## 2020-12-25 MED ORDER — INSULIN ASPART 100 UNIT/ML IJ SOLN
0.0000 [IU] | INTRAMUSCULAR | Status: DC
  Administered 2020-12-25: 5 [IU] via SUBCUTANEOUS

## 2020-12-25 MED ORDER — ACETAMINOPHEN 500 MG PO TABS
1000.0000 mg | ORAL_TABLET | Freq: Once | ORAL | Status: AC
  Administered 2020-12-25: 1000 mg via ORAL
  Filled 2020-12-25: qty 2

## 2020-12-25 MED ORDER — SALINE SPRAY 0.65 % NA SOLN
1.0000 | NASAL | Status: DC | PRN
  Administered 2020-12-25: 1 via NASAL
  Filled 2020-12-25 (×2): qty 44

## 2020-12-25 MED ORDER — VANCOMYCIN HCL 1500 MG/300ML IV SOLN
1500.0000 mg | Freq: Two times a day (BID) | INTRAVENOUS | Status: DC
  Administered 2020-12-26: 1500 mg via INTRAVENOUS
  Filled 2020-12-25 (×3): qty 300

## 2020-12-25 MED ORDER — PSEUDOEPHEDRINE HCL 60 MG PO TABS
60.0000 mg | ORAL_TABLET | Freq: Four times a day (QID) | ORAL | Status: DC | PRN
  Administered 2020-12-26 (×2): 60 mg via ORAL
  Filled 2020-12-25 (×5): qty 1

## 2020-12-25 MED ORDER — ENOXAPARIN SODIUM 40 MG/0.4ML IJ SOSY: 40.0000 mg | PREFILLED_SYRINGE | INTRAMUSCULAR | Status: DC

## 2020-12-25 MED ORDER — SODIUM CHLORIDE 0.9 % IV SOLN
2.0000 g | Freq: Once | INTRAVENOUS | Status: AC
  Administered 2020-12-25: 2 g via INTRAVENOUS
  Filled 2020-12-25: qty 2

## 2020-12-25 MED ORDER — TRAMADOL HCL 50 MG PO TABS
50.0000 mg | ORAL_TABLET | Freq: Four times a day (QID) | ORAL | Status: DC | PRN
  Administered 2020-12-25 – 2020-12-26 (×2): 50 mg via ORAL
  Filled 2020-12-25 (×2): qty 1

## 2020-12-25 MED ORDER — INSULIN ASPART 100 UNIT/ML IJ SOLN: 0.0000 [IU] | Freq: Three times a day (TID) | INTRAMUSCULAR | Status: DC

## 2020-12-25 MED ORDER — PROCHLORPERAZINE MALEATE 10 MG PO TABS
10.0000 mg | ORAL_TABLET | Freq: Four times a day (QID) | ORAL | Status: DC | PRN
  Administered 2020-12-25: 10 mg via ORAL
  Filled 2020-12-25: qty 1

## 2020-12-25 MED ORDER — INSULIN ASPART 100 UNIT/ML IJ SOLN: 0.0000 [IU] | Freq: Every day | INTRAMUSCULAR | Status: DC

## 2020-12-25 MED ORDER — LACTATED RINGERS IV BOLUS
1000.0000 mL | Freq: Once | INTRAVENOUS | Status: AC
  Administered 2020-12-25: 1000 mL via INTRAVENOUS

## 2020-12-25 MED ORDER — INSULIN GLARGINE-YFGN 100 UNIT/ML ~~LOC~~ SOPN: 60.0000 [IU] | PEN_INJECTOR | SUBCUTANEOUS | Status: DC

## 2020-12-25 MED ORDER — LACTATED RINGERS IV BOLUS (SEPSIS)
1000.0000 mL | Freq: Once | INTRAVENOUS | Status: AC
  Administered 2020-12-25: 1000 mL via INTRAVENOUS

## 2020-12-25 MED ORDER — METRONIDAZOLE 500 MG/100ML IV SOLN
500.0000 mg | Freq: Once | INTRAVENOUS | Status: AC
  Administered 2020-12-25: 500 mg via INTRAVENOUS
  Filled 2020-12-25: qty 100

## 2020-12-25 MED ORDER — INSULIN GLARGINE-YFGN 100 UNIT/ML ~~LOC~~ SOLN
60.0000 [IU] | Freq: Two times a day (BID) | SUBCUTANEOUS | Status: DC
  Administered 2020-12-25: 60 [IU] via SUBCUTANEOUS
  Administered 2020-12-26: 40 [IU] via SUBCUTANEOUS
  Filled 2020-12-25 (×3): qty 0.6

## 2020-12-25 MED ORDER — VANCOMYCIN HCL IN DEXTROSE 1-5 GM/200ML-% IV SOLN
1000.0000 mg | Freq: Once | INTRAVENOUS | Status: AC
  Administered 2020-12-25: 1000 mg via INTRAVENOUS

## 2020-12-25 MED ORDER — LACTATED RINGERS IV SOLN: INTRAVENOUS | Status: AC

## 2020-12-25 MED ORDER — PSEUDOEPHEDRINE HCL 30 MG PO TABS
30.0000 mg | ORAL_TABLET | Freq: Four times a day (QID) | ORAL | Status: DC | PRN
  Administered 2020-12-25: 30 mg via ORAL
  Filled 2020-12-25 (×3): qty 1

## 2020-12-25 MED ORDER — LORAZEPAM 0.5 MG PO TABS: 0.5000 mg | ORAL_TABLET | Freq: Every evening | ORAL | Status: DC | PRN

## 2020-12-25 MED ORDER — VANCOMYCIN HCL IN DEXTROSE 1-5 GM/200ML-% IV SOLN
1000.0000 mg | Freq: Once | INTRAVENOUS | Status: AC
  Administered 2020-12-25: 1000 mg via INTRAVENOUS
  Filled 2020-12-25: qty 200

## 2020-12-25 MED ORDER — IBUPROFEN 800 MG PO TABS
800.0000 mg | ORAL_TABLET | Freq: Once | ORAL | Status: AC
  Administered 2020-12-25: 800 mg via ORAL
  Filled 2020-12-25: qty 1

## 2020-12-25 NOTE — ED Notes (Signed)
ED Provider at bedside. 

## 2020-12-25 NOTE — ED Notes (Signed)
Attempted to call to report to RN on 5E at [facility].  RN in another pt's room and unable to take report.  I left my contact number for a return call.

## 2020-12-25 NOTE — Progress Notes (Signed)
Pharmacy Antibiotic Note  Matthew Osborne is a 45 y.o. male admitted on 12/25/2020 with infection of unknown source.  MD initially not planning antibiotics, but now Pharmacy has been consulted for cefepime/vancomycin dosing. SCr 0.88 on presentation.  Plan: Cefepime 2g IV q8h Vancomycin 2g IV x 1; then 1500mg  IV q12h. Goal AUC 400-550. Expected AUC: 516 SCr used: 0.88 Monitor clinical progress, c/s, renal function F/u de-escalation plan/LOT, vancomycin levels as indicated 7-day stop date entered per consults   Height: 6\' 2"  (188 cm) Weight: 108.9 kg (240 lb) IBW/kg (Calculated) : 82.2  Temp (24hrs), Avg:98.1 F (36.7 C), Min:98.1 F (36.7 C), Max:98.1 F (36.7 C)  Recent Labs  Lab 12/25/20 0927 12/25/20 1136  WBC 8.7  --   CREATININE 0.88  --   LATICACIDVEN 2.7* 2.0*    Estimated Creatinine Clearance: 139.3 mL/min (by C-G formula based on SCr of 0.88 mg/dL).    Allergies  Allergen Reactions   Niaspan [Niacin]     Flushing      Arturo Morton, PharmD, BCPS Please check AMION for all [facility] contact numbers Clinical Pharmacist 12/25/2020 2:16 PM

## 2020-12-25 NOTE — Progress Notes (Signed)
Pt and wife refused lovenox due to hx of low platelets.   12/25/20 2200  Notify: Provider  Provider Name/Title Clarene Essex NP  Date Provider Notified 12/25/20  Time Provider Notified 2200  Notification Type  (amion)  Notification Reason Red med refusal  Provider response No new orders  Date of Provider Response 12/25/20  Time of Provider Response 2200

## 2020-12-25 NOTE — ED Notes (Signed)
Per Simona Huh in lab, Magnesium level is 1.7 and is in Sun[facility] (result not crossing over to ASAP).

## 2020-12-25 NOTE — Treatment Plan (Signed)
45 yo with hx cholangiocarcinoma (on chemo with Dr. Sherrill), IDDM, HTN, HLD, chronic pain, thrombocytopenia, etc. Presenting with flu like symptoms.  Symptoms started Saturday with cough, congestion, fever, fatigue, nausea and vomiting.  He was supposed to have chemo today, but felt poorly and presented to [facility].  Workup thus far notable for COVID 19 positive.  Negative influenza.  Vitals with intermittent tachypnea, intermittent hypertension.  O2 sats all except 1 >94% on RA.  Labs notable for hyperglycemia, hyponatremia, elevated alk phos.  elevated lactic acid, which has improved with IVF.  UA not impressive for infection.  CXR without acute abnormalities.  S/p broad spectrum abx.  Dr. Dixon requesting admission for further workup, following cultures, consideration of treatment for covid.  Reasonable given immunocompromised state on chemo.  Will defer covid treatment to admitting provider, no current need for steroids given normal O2 status.  Follow procalcitonin, CRP, d dimer.  Requested sliding scale insulin be ordered and basal insulin ordered depending on timing of his last dose. 

## 2020-12-25 NOTE — H&P (Addendum)
History and Physical    Matthew Osborne HTD:[id number] DOB: 02-08-1975 DOA: 12/25/2020  PCP: Susy Frizzle, MD   Chief Complaint: fevers, chills, weakness  HPI: Matthew Osborne is a 45 y.o. male with medical history significant of cholangiocarcinoma (on chemo with Dr. Benay Spice), IDDM2, HTN, HLD, chronic pain, thrombocytopenia with recent covid 32 pneumonia in July of this year just 4 months ago who presents to [facility] ED with worsening fever, cough, congestion, chills, nausea, and vomiting. Covid-19 positive with negative flu, does not appear to be hypoxic at rest but has multiple SIRS criteria with negative procalcitonin ED requesting overnight observation in the setting of immunocompromised state, elevated lactic acidosis and possible acute covid infection.  Review of Systems: As per HPI, otherwise denies chest pain, headache, constipation, diarrhea.   Assessment/Plan   Rule out viral upper respiratory infection vs sinusitis Unlikely covid 19 infection Without overt pneumonia or hypoxia Failure to thrive, malaise secondary to above - Highly unlikely covid re-infection - possibly prolonged carrier given immune compromised state  - Continue supportive care, continue low rate IVF until PO intake improves with diabetic diet - Hold remdesivir/steroids/antibiotics given unremarkable CXR, patient is without hypoxia and resolving lactic acidosis with negative procalcitonin  Hypovolemic hyponatremia secondary to above - Liberalize diet as tolerated - if necessary will initiate IVF overnight; creatinine WNL  Lactic acidosis, multifactorial, resolving -Continue to increase PO intake - follow clinically  IDDM2 -Sliding scale insulin ongoing - diabetic diet started -Continue home long acting insulin -Dexcon connected - nursing can use for glucose verification while inpatient  HLD - Resume home meds once verified  HTN - Resume home meds once verified  Chronic thrombocytopenia/anemia of  chronic disease - Follow repeat labs  DVT prophylaxis: Lovenox  Code Status: Full  Family Communication: Wife at bedside  Status is: Inpt  Dispo: The patient is from: Home              Anticipated d/c is to: Home              Anticipated d/c date is: 24-48h              Patient currently not medically stable for discharge due to ongoing need for IVF and treatment of viral illness  Consultants:  None  Procedures:  None   Past Medical History:  Diagnosis Date   Cancer ([place])    De Quervain's tenosynovitis, left 03/2011   Diabetes mellitus    NIDDM   Family history of breast cancer    Family history of Hodgkin's lymphoma    Hyperlipidemia    Hypertension    under control; has been on med. x 4 yrs.   Nonproliferative retinopathy due to secondary diabetes [facility])     Past Surgical History:  Procedure Laterality Date   CARPAL TUNNEL RELEASE     bilat.   DORSAL COMPARTMENT RELEASE  03/14/2011   Procedure: RELEASE DORSAL COMPARTMENT (DEQUERVAIN);  Surgeon: Paulene Floor, MD;  Location: [facility];  Service: Orthopedics;  Laterality: Left;  left wrist APL and EPL tenosynovectomy and 1st dorsal compartment release   IR IMAGING GUIDED PORT INSERTION  02/01/2020   TONSILLECTOMY AND ADENOIDECTOMY  as a child   VASECTOMY       reports that he quit smoking about 21 years ago. His smoking use included cigarettes. He has a 18.00 pack-year smoking history. He has never used smokeless tobacco. He reports that he does not drink alcohol and does not use  drugs.  Allergies  Allergen Reactions   Niaspan [Niacin]     Flushing     Family History  Problem Relation Age of Onset   Cancer Other    Hyperlipidemia Other    Hypertension Brother    Asthma Son    Lung cancer Maternal Grandfather    Mesothelioma Maternal Grandfather        asbestos exposure   Rheum arthritis Maternal Grandmother    Breast cancer Maternal Grandmother 10   Hodgkin's lymphoma Mother 67        30 lb tumor removed from abdomen   Cirrhosis Paternal Grandmother    Other Maternal Great-grandfather        black lung, worked in [place] (MGF's father)    Prior to Admission medications   Medication Sig Start Date End Date Taking? Authorizing Provider  bisoprolol-hydrochlorothiazide (ZIAC) 10-6.25 MG tablet TAKE 1 TABLET BY MOUTH EVERY DAY IN THE MORNING Patient taking differently: Take 1 tablet by mouth daily. 12/09/19  Yes BranchAlphonse Guild, MD  amoxicillin (AMOXIL) 500 MG capsule Take 1 capsule by mouth 3 times daily, start 2 days before surgery Patient not taking: Reported on 12/25/2020 10/24/20   Dorna Bloom, DDS  blood glucose meter kit and supplies KIT Fasting prior to each meal three times a day 08/25/19   Annie Main, FNP  Continuous Blood Gluc Receiver (DEXCOM G6 RECEIVER) DEVI USE AS DIRECTED 03/03/20 03/03/21  Susy Frizzle, MD  Continuous Blood Gluc Sensor (DEXCOM G6 SENSOR) MISC Use as directed to check blood sugar 5-6 times daily. 11/03/20   Susy Frizzle, MD  Continuous Blood Gluc Transmit (DEXCOM G6 TRANSMITTER) MISC USE AS DIRECTED TO CHECK BLOOD SUGAR 5-6 TIMES DAILY 09/26/20 09/26/21  Susy Frizzle, MD  glucose blood test strip Dispense based on patient and insurance preference.  Use twice daily as directed. (FOR ICD-10 E11.65) 08/29/17   Susy Frizzle, MD  insulin glargine-yfgn (SEMGLEE, YFGN,) 100 UNIT/ML Pen Inject 70 Units into the skin 2 (two) times daily. 09/01/20   Susy Frizzle, MD  insulin lispro (HUMALOG) 100 UNIT/ML KwikPen CHECK BG PRIOR TO MEALS & IF BG IS 150-200 2 UNITS,BG 201-250 4 UNIT,251-300 6 U,301-350 8 U, 351-400 10 U. OVER 401 12 UNIT(S) & CALL MD 02/10/20 03/05/21  Susy Frizzle, MD  Insulin Pen Needle 31G X 5 MM MISC USE 4 PER DAY TO INJECT HUMALOG KWIKPEN & BASAGLAR. 02/10/20 02/09/21  Susy Frizzle, MD  Insulin Syringe 27G X 1/2" 0.5 ML MISC Use as directed to inject insulin SQ Q1D. 01/31/20   Susy Frizzle, MD  Lancets  Thin MISC Check BS BID 08/29/17   Susy Frizzle, MD  lidocaine-prilocaine (EMLA) cream APPLY 1 APPLICATION TOPICALLY AS DIRECTED. APPLY TO PORT SITE 1-2 HOURS PRIOR TO STICK AND COVER WITH PLASTIC WRAP. 11/13/20 11/13/21  Ladell Pier, MD  LORazepam (ATIVAN) 0.5 MG tablet Take 1 tablet (0.5 mg total) by mouth at bedtime as needed for anxiety. 11/13/20   Ladell Pier, MD  ondansetron (ZOFRAN) 8 MG tablet TAKE 1 TABLET BY MOUTH EVERY 8 HOURS AS NEEDED FOR NAUSEA OR VOMITING. BEGIN 72 HOURS AFTER IV CHEMOTHERAPY TREATMENT Patient not taking: No sig reported 08/28/20 08/28/21  Ladell Pier, MD  prochlorperazine (COMPAZINE) 10 MG tablet TAKE 1 TABLET BY MOUTH EVERY 6 HOURS AS NEEDED FOR NAUSEA Patient not taking: No sig reported 01/26/20 01/25/21  Ladell Pier, MD  traMADol (ULTRAM) 50 MG tablet TAKE  1 TABLET BY MOUTH EVERY 6 HOURS AS NEEDED FOR MODERATE PAIN (COUGH). 08/28/20 02/24/21  Ladell Pier, MD  TRUEPLUS LANCETS 33G MISC 1 each by Does not apply route 2 (two) times daily. 07/06/13   Susy Frizzle, MD    Physical Exam: Vitals:   12/25/20 1400 12/25/20 1445 12/25/20 1600 12/25/20 1732  BP: 140/68 (!) 149/86 (!) 150/89 (!) 146/81  Pulse: 77 76 78 81  Resp: $Remo'12 17 17 20  'bqrZo$ Temp:  98.2 F (36.8 C)  97.9 F (36.6 C)  TempSrc:  Oral  Oral  SpO2: 97% 96% 97% 100%  Weight:      Height:        Constitutional: NAD, calm, comfortable Vitals:   12/25/20 1400 12/25/20 1445 12/25/20 1600 12/25/20 1732  BP: 140/68 (!) 149/86 (!) 150/89 (!) 146/81  Pulse: 77 76 78 81  Resp: $Remo'12 17 17 20  'epfno$ Temp:  98.2 F (36.8 C)  97.9 F (36.6 C)  TempSrc:  Oral  Oral  SpO2: 97% 96% 97% 100%  Weight:      Height:       General:  Pleasantly resting in bed, No acute distress. HEENT:  Normocephalic atraumatic.  Sclerae nonicteric, noninjected.  Extraocular movements intact bilaterally. Neck:  Without mass or deformity.  Trachea is midline. Lungs:  Clear to auscultate bilaterally without  rhonchi, wheeze, or rales. Heart:  Regular rate and rhythm.  Without murmurs, rubs, or gallops. Abdomen:  Soft, nontender, nondistended.  Without guarding or rebound. Extremities: Without cyanosis, clubbing, edema, or obvious deformity. Vascular:  Dorsalis pedis and posterior tibial pulses palpable bilaterally. Skin:  Warm and dry, no erythema, no ulcerations.  Labs on Admission: I have personally reviewed following labs and imaging studies  CBC: Recent Labs  Lab 12/25/20 0927  WBC 8.7  NEUTROABS 6.8  HGB 9.7*  HCT 29.0*  MCV 103.9*  PLT 578   Basic Metabolic Panel: Recent Labs  Lab 12/25/20 0927  NA 129*  K 4.0  CL 94*  CO2 27  GLUCOSE 283*  BUN 15  CREATININE 0.88  CALCIUM 9.1  MG 1.7   GFR: Estimated Creatinine Clearance: 139.3 mL/min (by C-G formula based on SCr of 0.88 mg/dL). Liver Function Tests: Recent Labs  Lab 12/25/20 0927  AST 36  ALT 18  ALKPHOS 191*  BILITOT 0.7  PROT 6.9  ALBUMIN 3.7   No results for input(s): LIPASE, AMYLASE in the last 168 hours. No results for input(s): AMMONIA in the last 168 hours. Coagulation Profile: No results for input(s): INR, PROTIME in the last 168 hours. Cardiac Enzymes: No results for input(s): CKTOTAL, CKMB, CKMBINDEX, TROPONINI in the last 168 hours. BNP (last 3 results) No results for input(s): PROBNP in the last 8760 hours. HbA1C: Recent Labs    12/25/20 1439  HGBA1C 7.6*   CBG: Recent Labs  Lab 12/25/20 1441  GLUCAP 214*   Lipid Profile: No results for input(s): CHOL, HDL, LDLCALC, TRIG, CHOLHDL, LDLDIRECT in the last 72 hours. Thyroid Function Tests: No results for input(s): TSH, T4TOTAL, FREET4, T3FREE, THYROIDAB in the last 72 hours. Anemia Panel: No results for input(s): VITAMINB12, FOLATE, FERRITIN, TIBC, IRON, RETICCTPCT in the last 72 hours. Urine analysis:    Component Value Date/Time   COLORURINE YELLOW 12/25/2020 1105   APPEARANCEUR CLEAR 12/25/2020 1105   LABSPEC 1.011  12/25/2020 1105   PHURINE 5.0 12/25/2020 1105   GLUCOSEU >1,000 (A) 12/25/2020 1105   HGBUR NEGATIVE 12/25/2020 1105   [place] 12/25/2020 1105  KETONESUR NEGATIVE 12/25/2020 1105   PROTEINUR NEGATIVE 12/25/2020 1105   UROBILINOGEN 0.2 10/06/2013 1214   NITRITE NEGATIVE 12/25/2020 1105   LEUKOCYTESUR NEGATIVE 12/25/2020 1105    Radiological Exams on Admission: DG Chest Port 1 View  Result Date: 12/25/2020 CLINICAL DATA:  45 year old male with possible sepsis. Cholangiocarcinoma, scheduled for chemotherapy. EXAM: PORTABLE CHEST 1 VIEW COMPARISON:  Portable chest 07/22/2020 and earlier. FINDINGS: Portable AP upright view at 0619 hours. Stable right chest power port. Mildly improved lung volumes compared to June. Normal cardiac size and mediastinal contours. Visualized tracheal air column is within normal limits. Allowing for portable technique the lungs are clear. No pleural effusion or pneumothorax. No osseous abnormality identified. Negative visible bowel gas. IMPRESSION: Negative portable chest. Electronically Signed   By: Genevie Ann M.D.   On: 12/25/2020 09:31     [facility] For contact please use secure messenger on Epic  If 7PM-7AM, please contact night-coverage located on [email or web address]   12/25/2020, 5:33 PM

## 2020-12-25 NOTE — ED Triage Notes (Signed)
Pt c/o fever, chills, sore throat since Saturday.  Pt was supposed to have chemo treatment today.

## 2020-12-25 NOTE — ED Notes (Signed)
Blood cultures drawn and sent.

## 2020-12-25 NOTE — ED Provider Notes (Signed)
[facility] EMERGENCY DEPT Provider Note   CSN: [id number] Arrival date & time: 12/25/20  6314     History Chief Complaint  Patient presents with   Fever    Matthew Osborne is a 45 y.o. male.  HPI 45 year old male with cholangiocarcinoma, currently on gemcitabine/cisplatin/durvalumab therapy (started on 10/10, last session 2 weeks ago), presenting for flulike symptoms.  Onset of symptoms was Saturday morning.  He has had cough, congestion, fever, fatigue, nausea, and vomiting.  He describes his phlegm as beige in color, and sometimes rust colored.  His last dose of antipyresis was 5 AM (Tylenol).  He currently endorses sore throat.  He denies any other areas of pain at this time.  He has not had any known sick contacts at home.    Past Medical History:  Diagnosis Date   Cancer ([place])    De Quervain's tenosynovitis, left 03/2011   Diabetes mellitus    NIDDM   Family history of breast cancer    Family history of Hodgkin's lymphoma    Hyperlipidemia    Hypertension    under control; has been on med. x 4 yrs.   Nonproliferative retinopathy due to secondary diabetes [facility])     Patient Active Problem List   Diagnosis Date Noted   COVID 12/25/2020   COVID-19 virus infection 12/25/2020   Genetic testing 03/24/2020   Family history of breast cancer    Family history of Hodgkin's lymphoma    Cholangiocarcinoma ([place]) 02/01/2020   Goals of care, counseling/discussion 01/21/2020   Pancreas cancer ([place]) 01/21/2020   Hypercalcemia 01/14/2020   Left elbow pain 05/19/2019   Numbness 05/19/2019   Nonproliferative retinopathy due to secondary diabetes ([place])    Allergic rhinitis 06/17/2014   Sinus congestion 05/27/2014   Chronic cough 02/14/2014   Essential hypertension, benign 06/30/2013   Hyperlipidemia 06/30/2013   Diabetes ([place]) 06/30/2013    Past Surgical History:  Procedure Laterality Date   CARPAL TUNNEL RELEASE     bilat.   DORSAL COMPARTMENT RELEASE   03/14/2011   Procedure: RELEASE DORSAL COMPARTMENT (DEQUERVAIN);  Surgeon: Paulene Floor, MD;  Location: [facility];  Service: Orthopedics;  Laterality: Left;  left wrist APL and EPL tenosynovectomy and 1st dorsal compartment release   IR IMAGING GUIDED PORT INSERTION  02/01/2020   TONSILLECTOMY AND ADENOIDECTOMY  as a child   VASECTOMY         Family History  Problem Relation Age of Onset   Cancer Other    Hyperlipidemia Other    Hypertension Brother    Asthma Son    Lung cancer Maternal Grandfather    Mesothelioma Maternal Grandfather        asbestos exposure   Rheum arthritis Maternal Grandmother    Breast cancer Maternal Grandmother 18   Hodgkin's lymphoma Mother 95       30 lb tumor removed from abdomen   Cirrhosis Paternal Grandmother    Other Maternal Great-grandfather        black lung, worked in [place] (MGF's father)    Social History   Tobacco Use   Smoking status: Former    Packs/day: 3.00    Years: 6.00    Pack years: 18.00    Types: Cigarettes    Quit date: 02/05/1999    Years since quitting: 21.9   Smokeless tobacco: Never  Vaping Use   Vaping Use: Never used  Substance Use Topics   Alcohol use: No    Alcohol/week: 0.0  standard drinks   Drug use: No    Home Medications Prior to Admission medications   Medication Sig Start Date End Date Taking? Authorizing Provider  acetaminophen (TYLENOL) 500 MG tablet Take 1,000 mg by mouth every 4 (four) hours as needed for fever.   Yes [provider]  bisoprolol-hydrochlorothiazide (ZIAC) 10-6.25 MG tablet TAKE 1 TABLET BY MOUTH EVERY DAY IN THE MORNING Patient taking differently: Take 1 tablet by mouth daily. 12/09/19  Yes BranchAlphonse Guild, MD  ibuprofen (ADVIL) 200 MG tablet Take 800 mg by mouth every 4 (four) hours as needed for fever.   Yes [provider]  insulin glargine-yfgn (SEMGLEE, YFGN,) 100 UNIT/ML Pen Inject 70 Units into the skin 2 (two) times daily. Patient  taking differently: Inject 60-90 Units into the skin See admin instructions. Inject 90 units subcutaneously twice daily on chemo day and for 3 days therafter; inject 60 units twice daily on non-chemo days 09/01/20  Yes Susy Frizzle, MD  insulin lispro (HUMALOG) 100 UNIT/ML KwikPen CHECK BG PRIOR TO MEALS & IF BG IS 150-200 2 UNITS,BG 201-250 4 UNIT,251-300 6 U,301-350 8 U, 351-400 10 U. OVER 401 12 UNIT(S) & CALL MD Patient taking differently: 4-24 Units See admin instructions. Inject 4-24 units subcutaneously up to 8 times daily (per Dexcom readings) per sliding scale: CBG 150-200 4 units, 201-250 8 units, 251-300 12 units, 301-350 16 units, 351-400 20 units, >400 24 units and call MD 02/10/20 03/05/21 Yes Susy Frizzle, MD  lidocaine-prilocaine (EMLA) cream APPLY 1 APPLICATION TOPICALLY AS DIRECTED. APPLY TO PORT SITE 1-2 HOURS PRIOR TO STICK AND COVER WITH PLASTIC WRAP. Patient taking differently: 1 application See admin instructions. Apply topically to port site 1-2 hours prior to access - cover with plastic wrap 11/13/20 11/13/21 Yes Ladell Pier, MD  LORazepam (ATIVAN) 0.5 MG tablet Take 1 tablet (0.5 mg total) by mouth at bedtime as needed for anxiety. Patient taking differently: Take 0.5 mg by mouth at bedtime as needed (anxiety/agitation after chemo treatments). 11/13/20  Yes Ladell Pier, MD  ondansetron (ZOFRAN) 8 MG tablet TAKE 1 TABLET BY MOUTH EVERY 8 HOURS AS NEEDED FOR NAUSEA OR VOMITING. BEGIN 72 HOURS AFTER IV CHEMOTHERAPY TREATMENT Patient taking differently: Take 8 mg by mouth every 8 (eight) hours as needed for nausea or vomiting. Begin 72 hours after IV chemotherapy treatment 08/28/20 08/28/21 Yes Ladell Pier, MD  prochlorperazine (COMPAZINE) 10 MG tablet TAKE 1 TABLET BY MOUTH EVERY 6 HOURS AS NEEDED FOR NAUSEA Patient taking differently: Take 10 mg by mouth every 6 (six) hours as needed for nausea or vomiting. 01/26/20 01/25/21 Yes Ladell Pier, MD   pseudoephedrine (SUDAFED) 30 MG tablet Take 60 mg by mouth every 4 (four) hours as needed for congestion.   Yes [provider]  traMADol (ULTRAM) 50 MG tablet TAKE 1 TABLET BY MOUTH EVERY 6 HOURS AS NEEDED FOR MODERATE PAIN (COUGH). Patient taking differently: Take 50 mg by mouth every 6 (six) hours as needed for moderate pain (cough). 08/28/20 02/24/21 Yes Ladell Pier, MD  blood glucose meter kit and supplies KIT Fasting prior to each meal three times a day 08/25/19   Annie Main, FNP  Continuous Blood Gluc Receiver (DEXCOM G6 RECEIVER) DEVI USE AS DIRECTED 03/03/20 03/03/21  Susy Frizzle, MD  Continuous Blood Gluc Sensor (DEXCOM G6 SENSOR) MISC Use as directed to check blood sugar 5-6 times daily. 11/03/20   Susy Frizzle, MD  Continuous Blood Gluc  Transmit (DEXCOM G6 TRANSMITTER) MISC USE AS DIRECTED TO CHECK BLOOD SUGAR 5-6 TIMES DAILY 09/26/20 09/26/21  Susy Frizzle, MD  glucose blood test strip Dispense based on patient and insurance preference.  Use twice daily as directed. (FOR ICD-10 E11.65) 08/29/17   Susy Frizzle, MD  Insulin Pen Needle 31G X 5 MM MISC USE 4 PER DAY TO INJECT HUMALOG KWIKPEN & BASAGLAR. 02/10/20 02/09/21  Susy Frizzle, MD  Insulin Syringe 27G X 1/2" 0.5 ML MISC Use as directed to inject insulin SQ Q1D. 01/31/20   Susy Frizzle, MD  Lancets Thin MISC Check BS BID 08/29/17   Susy Frizzle, MD  TRUEPLUS LANCETS 33G MISC 1 each by Does not apply route 2 (two) times daily. 07/06/13   Susy Frizzle, MD    Allergies    Niaspan [niacin]  Review of Systems   Review of Systems  Constitutional:  Positive for activity change, appetite change, chills, fatigue and fever.  HENT:  Positive for congestion, postnasal drip and sore throat. Negative for ear pain and mouth sores.   Eyes:  Negative for pain and visual disturbance.  Respiratory:  Positive for cough. Negative for shortness of breath, wheezing and stridor.   Cardiovascular:   Negative for chest pain and palpitations.  Gastrointestinal:  Positive for nausea and vomiting. Negative for abdominal distention and abdominal pain.  Genitourinary:  Negative for dysuria, flank pain and hematuria.  Musculoskeletal:  Negative for arthralgias, back pain and neck pain.  Skin:  Negative for color change, rash and wound.  Neurological:  Negative for dizziness, seizures, syncope, weakness, light-headedness, numbness and headaches.  Hematological:  Does not bruise/bleed easily.  Psychiatric/Behavioral:  Negative for behavioral problems and confusion.   All other systems reviewed and are negative.  Physical Exam Updated Vital Signs BP (!) 144/92 (BP Location: Right Arm)   Pulse 87   Temp 98.1 F (36.7 C) (Oral)   Resp 20   Ht 6' 2" (1.88 m)   Wt 108.9 kg   SpO2 98%   BMI 30.81 kg/m   Physical Exam Vitals and nursing note reviewed.  Constitutional:      General: He is not in acute distress.    Appearance: He is well-developed and normal weight. He is ill-appearing. He is not toxic-appearing or diaphoretic.  HENT:     Head: Normocephalic and atraumatic.     Right Ear: Tympanic membrane, ear canal and external ear normal.     Left Ear: Tympanic membrane, ear canal and external ear normal.     Nose: Congestion present.     Mouth/Throat:     Mouth: Mucous membranes are moist.     Pharynx: Posterior oropharyngeal erythema present.  Eyes:     General: No scleral icterus.    Extraocular Movements: Extraocular movements intact.     Conjunctiva/sclera: Conjunctivae normal.  Cardiovascular:     Rate and Rhythm: Normal rate and regular rhythm.     Heart sounds: No murmur heard. Pulmonary:     Effort: Pulmonary effort is normal. No respiratory distress.     Breath sounds: Normal breath sounds. No wheezing or rales.  Abdominal:     Palpations: Abdomen is soft.     Tenderness: There is no abdominal tenderness.  Musculoskeletal:        General: No swelling.     Cervical  back: Normal range of motion and neck supple. No rigidity.     Right lower leg: No edema.  Left lower leg: No edema.  Lymphadenopathy:     Cervical: Cervical adenopathy present.  Skin:    General: Skin is warm and dry.     Capillary Refill: Capillary refill takes less than 2 seconds.     Coloration: Skin is not jaundiced or pale.  Neurological:     General: No focal deficit present.     Mental Status: He is alert and oriented to person, place, and time.     Cranial Nerves: No cranial nerve deficit.     Sensory: No sensory deficit.     Motor: No weakness.  Psychiatric:        Mood and Affect: Mood normal.        Behavior: Behavior normal.        Thought Content: Thought content normal.        Judgment: Judgment normal.    ED Results / Procedures / Treatments   Labs (all labs ordered are listed, but only abnormal results are displayed) Labs Reviewed  RESP PANEL BY RT-PCR (FLU A&B, COVID) ARPGX2 - Abnormal; Notable for the following components:      Result Value   SARS Coronavirus 2 by RT PCR POSITIVE (*)    All other components within normal limits  LACTIC ACID, PLASMA - Abnormal; Notable for the following components:   Lactic Acid, Venous 2.7 (*)    All other components within normal limits  LACTIC ACID, PLASMA - Abnormal; Notable for the following components:   Lactic Acid, Venous 2.0 (*)    All other components within normal limits  COMPREHENSIVE METABOLIC PANEL - Abnormal; Notable for the following components:   Sodium 129 (*)    Chloride 94 (*)    Glucose, Bld 283 (*)    Alkaline Phosphatase 191 (*)    All other components within normal limits  CBC WITH DIFFERENTIAL/PLATELET - Abnormal; Notable for the following components:   RBC 2.79 (*)    Hemoglobin 9.7 (*)    HCT 29.0 (*)    MCV 103.9 (*)    MCH 34.8 (*)    Lymphs Abs 0.5 (*)    Monocytes Absolute 1.1 (*)    All other components within normal limits  URINALYSIS, ROUTINE W REFLEX MICROSCOPIC - Abnormal;  Notable for the following components:   Glucose, UA >1,000 (*)    Bacteria, UA RARE (*)    All other components within normal limits  HEMOGLOBIN A1C - Abnormal; Notable for the following components:   Hgb A1c MFr Bld 7.6 (*)    All other components within normal limits  C-REACTIVE PROTEIN - Abnormal; Notable for the following components:   CRP 5.6 (*)    All other components within normal limits  D-DIMER, QUANTITATIVE - Abnormal; Notable for the following components:   D-Dimer, Quant 0.60 (*)    All other components within normal limits  COMPREHENSIVE METABOLIC PANEL - Abnormal; Notable for the following components:   Sodium 129 (*)    Chloride 96 (*)    Glucose, Bld 109 (*)    Calcium 8.3 (*)    AST 44 (*)    Alkaline Phosphatase 190 (*)    All other components within normal limits  CBC - Abnormal; Notable for the following components:   WBC 11.2 (*)    RBC 2.87 (*)    Hemoglobin 10.2 (*)    HCT 30.3 (*)    MCV 105.6 (*)    MCH 35.5 (*)    All other components within normal limits  GLUCOSE, CAPILLARY - Abnormal; Notable for the following components:   Glucose-Capillary 117 (*)    All other components within normal limits  CBG MONITORING, ED - Abnormal; Notable for the following components:   Glucose-Capillary 214 (*)    All other components within normal limits  CULTURE, BLOOD (ROUTINE X 2)  CULTURE, BLOOD (ROUTINE X 2)  PROCALCITONIN    EKG None  Radiology DG Chest Port 1 View  Result Date: 12/25/2020 CLINICAL DATA:  45 year old male with possible sepsis. Cholangiocarcinoma, scheduled for chemotherapy. EXAM: PORTABLE CHEST 1 VIEW COMPARISON:  Portable chest 07/22/2020 and earlier. FINDINGS: Portable AP upright view at 0619 hours. Stable right chest power port. Mildly improved lung volumes compared to June. Normal cardiac size and mediastinal contours. Visualized tracheal air column is within normal limits. Allowing for portable technique the lungs are clear. No  pleural effusion or pneumothorax. No osseous abnormality identified. Negative visible bowel gas. IMPRESSION: Negative portable chest. Electronically Signed   By: Genevie Ann M.D.   On: 12/25/2020 09:31    Procedures Procedures   Medications Ordered in ED Medications  lactated ringers infusion ( Intravenous New Bag/Given 12/25/20 2354)  vancomycin (VANCOREADY) IVPB 1500 mg/300 mL (1,500 mg Intravenous New Bag/Given 12/26/20 0330)  insulin aspart (novoLOG) injection 0-9 Units (has no administration in time range)  insulin aspart (novoLOG) injection 0-5 Units (0 Units Subcutaneous Not Given 12/25/20 2002)  enoxaparin (LOVENOX) injection 40 mg (40 mg Subcutaneous Patient Refused/Not Given 12/25/20 2135)  sodium chloride (OCEAN) 0.65 % nasal spray 1 spray (1 spray Each Nare Given 12/25/20 2233)  LORazepam (ATIVAN) tablet 0.5 mg (has no administration in time range)  prochlorperazine (COMPAZINE) tablet 10 mg (10 mg Oral Given 12/25/20 2351)  traMADol (ULTRAM) tablet 50 mg (50 mg Oral Given 12/26/20 0325)  insulin glargine-yfgn (SEMGLEE) injection 60 Units (60 Units Subcutaneous Given 12/25/20 2233)  pseudoephedrine (SUDAFED) tablet 60 mg (60 mg Oral Given 12/26/20 0326)  lactated ringers bolus 1,000 mL (0 mLs Intravenous Stopped 12/25/20 1603)  ibuprofen (ADVIL) tablet 800 mg (800 mg Oral Given 12/25/20 0921)  lactated ringers bolus 1,000 mL (0 mLs Intravenous Stopped 12/25/20 1603)  ceFEPIme (MAXIPIME) 2 g in sodium chloride 0.9 % 100 mL IVPB (0 g Intravenous Stopped 12/25/20 1603)  metroNIDAZOLE (FLAGYL) IVPB 500 mg (0 mg Intravenous Stopped 12/25/20 1614)  vancomycin (VANCOCIN) IVPB 1000 mg/200 mL premix (1,000 mg Intravenous New Bag/Given 12/25/20 1616)    And  vancomycin (VANCOCIN) IVPB 1000 mg/200 mL premix (0 mg Intravenous Stopped 12/25/20 1603)  magnesium sulfate IVPB 2 g 50 mL (2 g Intravenous New Bag/Given 12/25/20 1554)  acetaminophen (TYLENOL) tablet 1,000 mg (1,000 mg Oral Given  12/25/20 1526)  ondansetron (ZOFRAN) injection 4 mg (4 mg Intravenous Given 12/26/20 0359)    ED Course  I have reviewed the triage vital signs and the nursing notes.  Pertinent labs & imaging results that were available during my care of the patient were reviewed by me and considered in my medical decision making (see chart for details).    MDM Rules/Calculators/A&P                         CRITICAL CARE Performed by: Godfrey Pick   Total critical care time: 35 minutes  Critical care time was exclusive of separately billable procedures and treating other patients.  Critical care was necessary to treat or prevent imminent or life-threatening deterioration.  Critical care was time spent personally by me on the following  activities: development of treatment plan with patient and/or surrogate as well as nursing, discussions with consultants, evaluation of patient's response to treatment, examination of patient, obtaining history from patient or surrogate, ordering and performing treatments and interventions, ordering and review of laboratory studies, ordering and review of radiographic studies, pulse oximetry and re-evaluation of patient's condition.   45 year old male with recurrent cholangiocarcinoma, currently on chemotherapy, presenting for fever and URI symptoms for the past 2 days.  Patient is afebrile upon arrival.  Heart rate and blood pressure are normal.  Given concern for immunocompromise state, infectious work-up was initiated, including blood cultures.  Ibuprofen was given for symptomatic relief of sore throat.  Bolus of IV fluids ordered.  Laboratory work-up was notable for baseline anemia, lymphopenia, COVID-19 positivity, and a lactic acidosis of 2.7.  Blood sugar was elevated to 83.  Patient's sodium was slightly low (corrected to 133).  Other than COVID-19, no alternative source of infection is identified.  Patient had a previous COVID-19 infection in June.  It does not appear  that he has had a negative test since that time.  Regarding his elevated lactic acid, this could be secondary to dehydration and/or tumor burden.  Patient's vital signs, which remain normal in the ED, are not consistent with sepsis.  I had a shared decision-making discussion with the patient and his wife, who is very medically literate, regarding initial management.  In the absence of known bacterial source of infection, plan will be for repeat lactic acid prior to decision on whether or not to initiate antibiotics.  Repeat lactic acid, following 1 L bolus of IV fluids, was improved, but remained mildly elevated at 2.0.  Additional IV fluids were ordered.  Patient and wife were agreeable to initiate broad-spectrum antibiotics for empiric treatment of an occult bacterial infection.  Patient was admitted for continued management and monitoring.  Final Clinical Impression(s) / ED Diagnoses Final diagnoses:  Lactic acidosis    Rx / DC Orders ED Discharge Orders     None        Godfrey Pick, MD 12/26/20 [phone]

## 2020-12-26 ENCOUNTER — Telehealth: Payer: Self-pay | Admitting: *Deleted

## 2020-12-26 DIAGNOSIS — U071 COVID-19: Secondary | ICD-10-CM | POA: Diagnosis not present

## 2020-12-26 LAB — COMPREHENSIVE METABOLIC PANEL
ALT: 21 U/L (ref 0–44)
AST: 44 U/L — ABNORMAL HIGH (ref 15–41)
Albumin: 3.5 g/dL (ref 3.5–5.0)
Alkaline Phosphatase: 190 U/L — ABNORMAL HIGH (ref 38–126)
Anion gap: 6 (ref 5–15)
BUN: 12 mg/dL (ref 6–20)
CO2: 27 mmol/L (ref 22–32)
Calcium: 8.3 mg/dL — ABNORMAL LOW (ref 8.9–10.3)
Chloride: 96 mmol/L — ABNORMAL LOW (ref 98–111)
Creatinine, Ser: 0.83 mg/dL (ref 0.61–1.24)
GFR, Estimated: >60 mL/min (ref >60)
Glucose, Bld: 109 mg/dL — ABNORMAL HIGH (ref 70–99)
Potassium: 4 mmol/L (ref 3.5–5.1)
Sodium: 129 mmol/L — ABNORMAL LOW (ref 135–145)
Total Bilirubin: 0.8 mg/dL (ref 0.3–1.2)
Total Protein: 6.9 g/dL (ref 6.5–8.1)

## 2020-12-26 LAB — CBC
HCT: 30.3 % — ABNORMAL LOW (ref 39.0–52.0)
Hemoglobin: 10.2 g/dL — ABNORMAL LOW (ref 13.0–17.0)
MCH: 35.5 pg — ABNORMAL HIGH (ref 26.0–34.0)
MCHC: 33.7 g/dL (ref 30.0–36.0)
MCV: 105.6 fL — ABNORMAL HIGH (ref 80.0–100.0)
Platelets: 197 10*3/uL (ref 150–400)
RBC: 2.87 MIL/uL — ABNORMAL LOW (ref 4.22–5.81)
RDW: 15.1 % (ref 11.5–15.5)
WBC: 11.2 10*3/uL — ABNORMAL HIGH (ref 4.0–10.5)
nRBC: 0 % (ref 0.0–0.2)

## 2020-12-26 LAB — GLUCOSE, CAPILLARY
Glucose-Capillary: 67 mg/dL — ABNORMAL LOW (ref 70–99)
Glucose-Capillary: 140 mg/dL — ABNORMAL HIGH (ref 70–99)
Glucose-Capillary: 147 mg/dL — ABNORMAL HIGH (ref 70–99)

## 2020-12-26 MED ORDER — ONDANSETRON HCL 4 MG/2ML IJ SOLN
4.0000 mg | Freq: Once | INTRAMUSCULAR | Status: AC
  Administered 2020-12-26: 4 mg via INTRAVENOUS
  Filled 2020-12-26: qty 2

## 2020-12-26 NOTE — Progress Notes (Addendum)
Inpatient Diabetes Program Recommendations  AACE/ADA: New Consensus Statement on Inpatient Glycemic Control (2015)  Target Ranges:  Prepandial:   less than 140 mg/dL      Peak postprandial:   less than 180 mg/dL (1-2 hours)      Critically ill patients:  140 - 180 mg/dL    Latest Reference Range & Units 12/25/20 14:41 12/25/20 19:44 12/26/20 07:21  Glucose-Capillary 70 - 99 mg/dL 214 (H)  5 units Novolog @1548  117 (H)  60 units Semglee @2233  67 (L)    Latest Reference Range & Units 12/25/20 14:39  Hemoglobin A1C 4.8 - 5.6 % 7.6 (H)    Admit  Rule out viral upper respiratory infection vs sinusitis Unlikely covid 19 infection Without overt pneumonia or hypoxia Failure to thrive, malaise secondary to above  History: DM, Cholangiocarcinoma (on chemo with Dr. Benay Spice)  Home DM Meds: Dexcom CGM       Semglee 90 units BID on Chemo Days and for 3 days after Chemo and switch to 60 units BID on Non-Chemo days       Humalog 4-24 units up to 8 times daily per SSI based on Dexcom readings  Current Orders: Semglee 60 units BID      Novolog Sensitive Correction Scale/ SSI (0-9 units) TID AC + HS     MD- Note Hypoglycemia this AM (CBG 67) after receiving 60 units Semglee last PM  Due for another dose Semglee 60 units this AM  Please consider reducing Semglee to 55 units QHS (stop AM dose)   --Will follow patient during hospitalization--  Wyn Quaker RN, MSN, CDE Diabetes Coordinator Inpatient Glycemic Control Team Team Pager: [phone] (8a-5p)

## 2020-12-26 NOTE — Discharge Instructions (Signed)
Per Oncology: Follow up on 01/08/21 at 0745 for lab/flush/MD visit and treatment. Call office for any fever or shortness of breath or inability to tolerate oral intake.

## 2020-12-26 NOTE — Telephone Encounter (Signed)
Oncology Discharge Planning Admission Note  [facility] Address: [address], [place], [place] [address] Hours of Operation:  N[facility] Polio, Monday - Friday  Clinic Contact Information:  [phone]  Oncology Care Team: Medical Oncologist:  Dr. Betsy Coder  Contacted  Mrs. Discher  to inform that the oncology provider Dr. Benay Spice is aware of this hospital admission dated 12/25/20, and the cancer center will follow [facility] inpatient care to assist with discharge planning as indicated by the oncologist.  His next appointment for visit and chemotherapy is on 01/08/21 at [place]. Informed her it will be on his discharge summary. Disclaimer:  This [facility] note does not imply a formal consult request has been made by the admitting attending for this admission or there will be an inpatient consult completed by oncology.  Please request oncology consults as per standard process as indicated.

## 2020-12-26 NOTE — Discharge Summary (Signed)
Physician Discharge Summary  Matthew Osborne ERX:[id number] DOB: February 18, 1975 DOA: 12/25/2020  PCP: Susy Frizzle, MD  Admit date: 12/25/2020 Discharge date: 12/26/2020  Admitted From: home Disposition:  home  Recommendations for Outpatient Follow-up:  Follow up with PCP in 1-2 weeks Please obtain BMP/CBC in one week Please follow up with dr Benay Spice 01/08/21 at 7 72 am  Home Health:none Equipment/Devices:none Discharge Condition:stable CODE STATUS:full Diet recommendation:cardiac Brief/Interim Summary:  Matthew Osborne is a 45 y.o. male with medical history significant of cholangiocarcinoma (on chemo with Dr. Benay Spice), IDDM2, HTN, HLD, chronic pain, thrombocytopenia with recent covid 106 pneumonia in July of this year just 4 months ago who presents to [facility] with worsening fever, cough, congestion, chills, nausea, and vomiting. Covid-19 positive with negative flu, did not appear to be hypoxic at rest but has multiple SIRS criteria with negative procalcitonin ED requested overnight observation in the setting of immunocompromised state, elevated lactic acidosis and possible acute covid infection.  Discharge Diagnoses:  Principal Problem:   COVID Active Problems:   PJKDT-26 virus infection    covid 19 infection Without overt pneumonia or hypoxia-he was treated with supportive care including IV fluids, nebulizers.  He was not treated with remdesivir steroids or antibiotics due to patient without hypoxia and unremarkable chest x-ray and resolving lactic acidosis with negative procalcitonin. Lactic acidosis improved with IV hydration.   Hypovolemic hyponatremia -sodium was 129 on discharge.  He tolerated a regular diet prior to discharge.     IDDM2-continue home meds.  HLD-continue home meds   HTN -continue home meds.    Chronic thrombocytopenia/anemia of chronic disease-resolved platelets were 197 on discharge.   Estimated body mass index is 30.81 kg/m as calculated from  the following:   Height as of this encounter: $RemoveBeforeD'6\' 2"'kqNlEvkCwMbkFD$  (1.88 m).   Weight as of this encounter: 108.9 kg.  Discharge Instructions  Discharge Instructions     Diet - low sodium heart healthy   Complete by: As directed    Increase activity slowly   Complete by: As directed       Allergies as of 12/26/2020       Reactions   Niaspan [niacin] Other (See Comments)   Flushing        Medication List     TAKE these medications    acetaminophen 500 MG tablet Commonly known as: TYLENOL Take 1,000 mg by mouth every 4 (four) hours as needed for fever.   bisoprolol-hydrochlorothiazide 10-6.25 MG tablet Commonly known as: ZIAC TAKE 1 TABLET BY MOUTH EVERY DAY IN THE MORNING What changed:  how much to take how to take this when to take this additional instructions   blood glucose meter kit and supplies Kit Fasting prior to each meal three times a day   Dexcom G6 Receiver Devi USE AS DIRECTED   Dexcom G6 Sensor Misc Use as directed to check blood sugar 5-6 times daily.   Dexcom G6 Transmitter Misc USE AS DIRECTED TO CHECK BLOOD SUGAR 5-6 TIMES DAILY   glucose blood test strip Dispense based on patient and insurance preference.  Use twice daily as directed. (FOR ICD-10 E11.65)   HumaLOG KwikPen 100 UNIT/ML KwikPen Generic drug: insulin lispro CHECK BG PRIOR TO MEALS & IF BG IS 150-200 2 UNITS,BG 201-250 4 UNIT,251-300 6 U,301-350 8 U, 351-400 10 U. OVER 401 12 UNIT(S) & CALL MD What changed:  how much to take when to take this additional instructions   ibuprofen 200 MG tablet Commonly known as: ADVIL  Take 800 mg by mouth every 4 (four) hours as needed for fever.   Insulin Syringe 27G X 1/2" 0.5 ML Misc Use as directed to inject insulin SQ Q1D.   lidocaine-prilocaine cream Commonly known as: EMLA APPLY 1 APPLICATION TOPICALLY AS DIRECTED. APPLY TO PORT SITE 1-2 HOURS PRIOR TO STICK AND COVER WITH PLASTIC WRAP. What changed:  how much to take when to take  this additional instructions   LORazepam 0.5 MG tablet Commonly known as: ATIVAN Take 1 tablet (0.5 mg total) by mouth at bedtime as needed for anxiety. What changed: reasons to take this   ondansetron 8 MG tablet Commonly known as: ZOFRAN TAKE 1 TABLET BY MOUTH EVERY 8 HOURS AS NEEDED FOR NAUSEA OR VOMITING. BEGIN 72 HOURS AFTER IV CHEMOTHERAPY TREATMENT What changed:  how much to take how to take this when to take this reasons to take this additional instructions   prochlorperazine 10 MG tablet Commonly known as: COMPAZINE TAKE 1 TABLET BY MOUTH EVERY 6 HOURS AS NEEDED FOR NAUSEA What changed:  how much to take reasons to take this   pseudoephedrine 30 MG tablet Commonly known as: SUDAFED Take 60 mg by mouth every 4 (four) hours as needed for congestion.   Semglee (yfgn) 100 UNIT/ML Pen Generic drug: insulin glargine-yfgn Inject 70 Units into the skin 2 (two) times daily. What changed:  how much to take when to take this additional instructions   traMADol 50 MG tablet Commonly known as: ULTRAM TAKE 1 TABLET BY MOUTH EVERY 6 HOURS AS NEEDED FOR MODERATE PAIN (COUGH). What changed:  how much to take how to take this when to take this reasons to take this   TRUEplus Lancets 33G Misc 1 each by Does not apply route 2 (two) times daily.   Lancets Thin Misc Check BS BID   Unifine Pentips 31G X 5 MM Misc Generic drug: Insulin Pen Needle USE 4 PER DAY TO INJECT HUMALOG [place].        Follow-up Information     Susy Frizzle, MD Follow up.   Specialty: Family Medicine Contact information: [address] [place] [address] [phone]         Arnoldo Lenis, MD .   Specialty: Cardiology Contact information: [place] [address] [phone]         Ladell Pier, MD Follow up.   Specialty: Oncology Why: Your next chemo appointment is on 01/08/2021 at 7:45 AM with Dr. Rita Ohara  information: [address] [place] [address] [phone]                Allergies  Allergen Reactions   Niaspan [Niacin] Other (See Comments)    Flushing     Consultations: none   Procedures/Studies: DG Chest Port 1 View  Result Date: 12/25/2020 CLINICAL DATA:  45 year old male with possible sepsis. Cholangiocarcinoma, scheduled for chemotherapy. EXAM: PORTABLE CHEST 1 VIEW COMPARISON:  Portable chest 07/22/2020 and earlier. FINDINGS: Portable AP upright view at 0619 hours. Stable right chest power port. Mildly improved lung volumes compared to June. Normal cardiac size and mediastinal contours. Visualized tracheal air column is within normal limits. Allowing for portable technique the lungs are clear. No pleural effusion or pneumothorax. No osseous abnormality identified. Negative visible bowel gas. IMPRESSION: Negative portable chest. Electronically Signed   By: Genevie Ann M.D.   On: 12/25/2020 09:31   (Echo, Carotid, EGD, Colonoscopy, ERCP)  Subjective: Patient is resting in bed anxious to go home.  Eager to eat.  Had 2 bouts of vomiting yesterday but none since overnight.  No diarrhea or abdominal pain.  Discharge Exam: Vitals:   12/26/20 0033 12/26/20 0343  BP: (!) 144/69 (!) 144/92  Pulse: 84 87  Resp: 20 20  Temp: 97.9 F (36.6 C) 98.1 F (36.7 C)  SpO2: 96% 98%   Vitals:   12/25/20 1732 12/25/20 1941 12/26/20 0033 12/26/20 0343  BP: (!) 146/81 (!) 152/94 (!) 144/69 (!) 144/92  Pulse: 81 76 84 87  Resp: $Remo'20 20 20 20  'jVUJQ$ Temp: 97.9 F (36.6 C) 98.3 F (36.8 C) 97.9 F (36.6 C) 98.1 F (36.7 C)  TempSrc: Oral Oral Oral Oral  SpO2: 100% 99% 96% 98%  Weight:      Height:        General: Pt is alert, awake, not in acute distress Cardiovascular: RRR, S1/S2 +, no rubs, no gallops Respiratory: CTA bilaterally, no wheezing, no rhonchi Abdominal: Soft, NT, ND, bowel sounds + Extremities: no edema, no cyanosis    The results of  significant diagnostics from this hospitalization (including imaging, microbiology, ancillary and laboratory) are listed below for reference.     Microbiology: Recent Results (from the past 240 hour(s))  Resp Panel by RT-PCR (Flu A&B, Covid) Peripheral     Status: Abnormal   Collection Time: 12/25/20  8:54 AM   Specimen: Peripheral; Nasopharyngeal(NP) swabs in vial transport medium  Result Value Ref Range Status   SARS Coronavirus 2 by RT PCR POSITIVE (A) NEGATIVE Final    Comment: RESULT CALLED TO, READ BACK BY AND VERIFIED WITH: FOUNTAIN,M,RN @ 1021 12/25/20 BY GWYN,P (NOTE) SARS-CoV-2 target nucleic acids are DETECTED.  The SARS-CoV-2 RNA is generally detectable in upper respiratory specimens during the acute phase of infection. Positive results are indicative of the presence of the identified virus, but do not rule out bacterial infection or co-infection with other pathogens not detected by the test. Clinical correlation with patient history and other diagnostic information is necessary to determine patient infection status. The expected result is Negative.  Fact Sheet for Patients: EntrepreneurPulse.com.au  Fact Sheet for Healthcare Providers: IncredibleEmployment.be  This test is not yet approved or cleared by the [place] FDA and  has been authorized for detection and/or diagnosis of SARS-CoV-2 by FDA under an Emergency Use Authorization (EUA).  This EUA will remain in effect (meaning this test  can be used) for the duration of  the COVID-19 declaration under Section 564(b)(1) of the Act, 21 U.S.C. section 360bbb-3(b)(1), unless the authorization is terminated or revoked sooner.     Influenza A by PCR NEGATIVE NEGATIVE Final   Influenza B by PCR NEGATIVE NEGATIVE Final    Comment: (NOTE) The Xpert Xpress SARS-CoV-2/FLU/RSV plus assay is intended as an aid in the diagnosis of influenza from Nasopharyngeal swab specimens  and should not be used as a sole basis for treatment. Nasal washings and aspirates are unacceptable for Xpert Xpress SARS-CoV-2/FLU/RSV testing.  Fact Sheet for Patients: EntrepreneurPulse.com.au  Fact Sheet for Healthcare Providers: IncredibleEmployment.be  This test is not yet approved or cleared by the [place] FDA and has been authorized for detection and/or diagnosis of SARS-CoV-2 by FDA under an Emergency Use Authorization (EUA). This EUA will remain in effect (meaning this test can be used) for the duration of the COVID-19 declaration under Section 564(b)(1) of the Act, 21 U.S.C. section 360bbb-3(b)(1), unless the authorization is terminated or revoked.  Performed  at [facility], [address] [place], [place] [address]   Blood Culture (routine x 2)     Status: None (Preliminary result)   Collection Time: 12/25/20  9:20 AM   Specimen: BLOOD LEFT FOREARM  Result Value Ref Range Status   Specimen Description   Final    BLOOD LEFT FOREARM Performed at [facility], [address] [place], [place] [address]    Special Requests   Final    Blood Culture results may not be optimal due to an excessive volume of blood received in culture bottles Performed at [facility], [address] [place], [place] [address]    Culture   Final    NO GROWTH < 24 HOURS Performed at [facility], [place] [address], [place], [place] [address]    Report Status PENDING  Incomplete  Blood Culture (routine x 2)     Status: None (Preliminary result)   Collection Time: 12/25/20  9:25 AM   Specimen: Right Antecubital; Blood  Result Value Ref Range Status   Specimen Description   Final    RIGHT ANTECUBITAL Performed at [facility], [address] [place], [place] [address]    Special Requests   Final    Blood Culture adequate volume Performed at [facility], [address] [place], [place] [address]    Culture   Final    NO GROWTH < 24 HOURS Performed at [facility], [place] [address] [place],  [address]    Report Status PENDING  Incomplete     Labs: BNP (last 3 results) No results for input(s): BNP in the last 8760 hours. Basic Metabolic Panel: Recent Labs  Lab 12/25/20 0927 12/26/20 0445  NA 129* 129*  K 4.0 4.0  CL 94* 96*  CO2 27 27  GLUCOSE 283* 109*  BUN 15 12  CREATININE 0.88 0.83  CALCIUM 9.1 8.3*  MG 1.7  --    Liver Function Tests: Recent Labs  Lab 12/25/20 0927 12/26/20 0445  AST 36 44*  ALT 18 21  ALKPHOS 191* 190*  BILITOT 0.7 0.8  PROT 6.9 6.9  ALBUMIN 3.7 3.5   No results for input(s): LIPASE, AMYLASE in the last 168 hours. No results for input(s): AMMONIA in the last 168 hours. CBC: Recent Labs  Lab 12/25/20 0927 12/26/20 0445  WBC 8.7 11.2*  NEUTROABS 6.8  --   HGB 9.7* 10.2*  HCT 29.0* 30.3*  MCV 103.9* 105.6*  PLT 153 197   Cardiac Enzymes: No results for input(s): CKTOTAL, CKMB, CKMBINDEX, TROPONINI in the last 168 hours. BNP: Invalid input(s): POCBNP CBG: Recent Labs  Lab 12/25/20 1441 12/25/20 1944 12/26/20 0721 12/26/20 1001  GLUCAP 214* 117* 67* 140*   D-Dimer Recent Labs    12/25/20 1439  DDIMER 0.60*   Hgb A1c Recent Labs    12/25/20 1439  HGBA1C 7.6*   Lipid Profile No results for input(s): CHOL, HDL, LDLCALC, TRIG, CHOLHDL, LDLDIRECT in the last 72 hours. Thyroid function studies No results for input(s): TSH, T4TOTAL, T3FREE, THYROIDAB in the last 72 hours.  Invalid input(s): FREET3 Anemia work up No results for input(s): VITAMINB12, FOLATE, FERRITIN, TIBC, IRON, RETICCTPCT in the last 72 hours. Urinalysis    Component Value Date/Time   COLORURINE YELLOW 12/25/2020 1105   APPEARANCEUR CLEAR 12/25/2020 1105   LABSPEC 1.011 12/25/2020 1105   PHURINE 5.0 12/25/2020 1105   GLUCOSEU >1,000 (A) 12/25/2020 1105   HGBUR NEGATIVE  12/25/2020 1105   BILIRUBINUR NEGATIVE 12/25/2020 1105   KETONESUR NEGATIVE 12/25/2020 1105   PROTEINUR NEGATIVE 12/25/2020 1105   UROBILINOGEN 0.2 10/06/2013 1214   NITRITE NEGATIVE 12/25/2020 1105   LEUKOCYTESUR NEGATIVE 12/25/2020 1105   Sepsis Labs Invalid input(s): PROCALCITONIN,  WBC,  LACTICIDVEN Microbiology Recent Results (from the past 240 hour(s))  Resp Panel by RT-PCR (Flu A&B, Covid) Peripheral     Status: Abnormal   Collection Time: 12/25/20  8:54 AM   Specimen: Peripheral; Nasopharyngeal(NP) swabs in vial transport medium  Result Value Ref Range Status   SARS Coronavirus 2 by RT PCR POSITIVE (A) NEGATIVE Final    Comment: RESULT CALLED TO, READ BACK BY AND VERIFIED WITH: FOUNTAIN,M,RN @ 1021 12/25/20 BY GWYN,P (NOTE) SARS-CoV-2 target nucleic acids are DETECTED.  The SARS-CoV-2 RNA is generally detectable in upper respiratory specimens during the acute phase of infection. Positive results are indicative of the presence of the identified virus, but do not rule out bacterial infection or co-infection with other pathogens not detected by the test. Clinical correlation with patient history and other diagnostic information is necessary to determine patient infection status. The expected result is Negative.  Fact Sheet for Patients: BloggerCourse.com  Fact Sheet for Healthcare Providers: SeriousBroker.it  This test is not yet approved or cleared by the [place] FDA and  has been authorized for detection and/or diagnosis of SARS-CoV-2 by FDA under an Emergency Use Authorization (EUA).  This EUA will remain in effect (meaning this test  can be used) for the duration of  the COVID-19 declaration under Section 564(b)(1) of the Act, 21 U.S.C. section 360bbb-3(b)(1), unless the authorization is terminated or revoked sooner.     Influenza A by PCR NEGATIVE NEGATIVE Final   Influenza B by PCR NEGATIVE NEGATIVE Final     Comment: (NOTE) The Xpert Xpress SARS-CoV-2/FLU/RSV plus assay is intended as an aid in the diagnosis of influenza from Nasopharyngeal swab specimens and should not be used as a sole basis for treatment. Nasal washings and aspirates are unacceptable for Xpert Xpress SARS-CoV-2/FLU/RSV testing.  Fact Sheet for Patients: BloggerCourse.com  Fact Sheet for Healthcare Providers: SeriousBroker.it  This test is not yet approved or cleared by the [place] FDA and has been authorized for detection and/or diagnosis of SARS-CoV-2 by FDA under an Emergency Use Authorization (EUA). This EUA will remain in effect (meaning this test can be used) for the duration of the COVID-19 declaration under Section 564(b)(1) of the Act, 21 U.S.C. section 360bbb-3(b)(1), unless the authorization is terminated or revoked.  Performed at [facility], [address], [place], [place] [address]   Blood Culture (routine x 2)     Status: None (Preliminary result)   Collection Time: 12/25/20  9:20 AM   Specimen: BLOOD LEFT FOREARM  Result Value Ref Range Status   Specimen Description   Final    BLOOD LEFT FOREARM Performed at [facility], [address] [place], [place] [address]    Special Requests   Final    Blood Culture results may not be optimal due to an excessive volume of blood received in culture bottles Performed at [facility], [address] [place], [place] [address]    Culture   Final    NO GROWTH < 24 HOURS Performed at [facility], [address] [place], [place] [address]    Report Status PENDING  Incomplete  Blood Culture (routine x 2)     Status: None (Preliminary result)  Collection Time: 12/25/20  9:25 AM   Specimen: Right Antecubital; Blood  Result Value Ref Range Status   Specimen Description   Final    RIGHT ANTECUBITAL Performed at [facility], [address], [place], [place] [address]    Special Requests   Final    Blood Culture adequate volume Performed at [facility], [address] [place], [place] [address]    Culture   Final    NO GROWTH < 24 HOURS Performed at [facility], [place] [address], [place], [place] [address]    Report Status PENDING  Incomplete     Time coordinating discharge: 39 minutes  SIGNED: Georgette Shell, MD  [facility] 12/26/2020, 11:25 AM

## 2020-12-26 NOTE — Plan of Care (Signed)
  Problem: Activity: Goal: Risk for activity intolerance will decrease Outcome: Progressing   Problem: Nutrition: Goal: Adequate nutrition will be maintained Outcome: Progressing   Problem: Safety: Goal: Ability to remain free from injury will improve Outcome: Progressing   

## 2020-12-26 NOTE — Plan of Care (Signed)
Pt received 2 prn doses of tramadol and sudafed during overnight. NP J. Olena Heckle increased sudafed dose per pt request due to 30mg  dose not effective. Compazine given x 1 due to coughing up thick yellow mucus which induced vomiting. Clarene Essex NP contacted due to not time for compazine and pt n/v. Zofran given x 1 and has been effective. Afebrile during overnight vitals stable.  Problem: Education: Goal: Knowledge of General Education information will improve Description: Including pain rating scale, medication(s)/side effects and non-pharmacologic comfort measures Outcome: Progressing   Problem: Health Behavior/Discharge Planning: Goal: Ability to manage health-related needs will improve Outcome: Progressing   Problem: Clinical Measurements: Goal: Ability to maintain clinical measurements within normal limits will improve Outcome: Progressing Goal: Will remain free from infection Outcome: Progressing Goal: Diagnostic test results will improve Outcome: Progressing Goal: Respiratory complications will improve Outcome: Progressing Goal: Cardiovascular complication will be avoided Outcome: Progressing   Problem: Activity: Goal: Risk for activity intolerance will decrease Outcome: Progressing   Problem: Nutrition: Goal: Adequate nutrition will be maintained Outcome: Progressing   Problem: Coping: Goal: Level of anxiety will decrease Outcome: Progressing   Problem: Elimination: Goal: Will not experience complications related to bowel motility Outcome: Progressing Goal: Will not experience complications related to urinary retention Outcome: Progressing   Problem: Pain Managment: Goal: General experience of comfort will improve Outcome: Progressing   Problem: Safety: Goal: Ability to remain free from injury will improve Outcome: Progressing   Problem: Skin Integrity: Goal: Risk for impaired skin integrity will decrease Outcome: Progressing

## 2020-12-26 NOTE — Discharge Planning (Signed)
Oncology Discharge Planning Note  [facility] [place] Address: [address] [place], [place], [place] [address] Hours of Operation:  Nena Polio, Monday - Friday  Clinic Contact Information:  [phone]) [phone]  Oncology Care Team: Medical Oncologist:  Dr. Betsy Coder  Patient Details: Name:  Matthew Osborne, Gutman MRN:   [id number] DOB:   1976-01-04 Reason for Current Admission: COVID  Discharge Planning Narrative: Notification of admission received by Dr. Benay Spice for [facility].  Discharge follow-up appointments for oncology are current and available on the AVS and MyChart.   Upon discharge from the hospital, hematology/oncology's post discharge plan of care for the outpatient setting is: Resume chemotherapy on 01/08/21   Adlai Sinning will be called within two business days after discharge to review hematology/oncology's plan of care for full understanding.    Outpatient Oncology Specific Care Only: Oncology appointment transportation needs addressed?:  Wife transports Oncology medication management for symptom management addressed?:  yes Chemo Alert Card reviewed?:  yes Immunotherapy Alert Card reviewed?:  not applicable

## 2020-12-27 ENCOUNTER — Encounter: Payer: Self-pay | Admitting: *Deleted

## 2020-12-27 NOTE — Progress Notes (Signed)
Faxed recent d/c summary and next appointment date to [facility] [phone].

## 2020-12-30 LAB — CULTURE, BLOOD (ROUTINE X 2)
Culture: NO GROWTH
Culture: NO GROWTH
Special Requests: ADEQUATE

## 2021-01-01 ENCOUNTER — Inpatient Hospital Stay: Payer: No Typology Code available for payment source

## 2021-01-01 ENCOUNTER — Telehealth: Payer: Self-pay | Admitting: *Deleted

## 2021-01-01 ENCOUNTER — Other Ambulatory Visit: Payer: Self-pay

## 2021-01-01 DIAGNOSIS — I10 Essential (primary) hypertension: Secondary | ICD-10-CM

## 2021-01-01 NOTE — Telephone Encounter (Signed)
Matthew Osborne was contacted by telephone voice mail to verify understanding of discharge instructions status post their most recent discharge from the hospital on the date:  12/26/20.  Cancer center appointments were provided via VM. Requested he return call with any questions, concerns or barriers to his appointment on 01/08/21 at [facility]

## 2021-01-02 ENCOUNTER — Telehealth: Payer: Self-pay | Admitting: *Deleted

## 2021-01-02 ENCOUNTER — Inpatient Hospital Stay: Payer: No Typology Code available for payment source

## 2021-01-02 NOTE — Chronic Care Management (AMB) (Signed)
  Care Management   Note  01/02/2021 Name: Matthew Osborne MRN: [id number] DOB: May 29, 1975  Matthew Osborne is a 45 y.o. year old male who is a primary care patient of Pickard, Cammie Mcgee, MD. I reached out to [employer] by phone today in response to a referral sent by Matthew Osborne primary care provider.   Matthew Osborne was given information about care management services today including:  Care management services include personalized support from designated clinical staff supervised by his physician, including individualized plan of care and coordination with other care providers 24/7 contact phone numbers for assistance for urgent and routine care needs. The patient may stop care management services at any time by phone call to the office staff.  Patient agreed to services and verbal consent obtained.   Follow up plan: Telephone appointment with care management team member scheduled for:01/05/21  [place]: [phone]

## 2021-01-05 ENCOUNTER — Ambulatory Visit: Payer: No Typology Code available for payment source | Admitting: *Deleted

## 2021-01-05 ENCOUNTER — Ambulatory Visit: Payer: No Typology Code available for payment source | Admitting: Family Medicine

## 2021-01-05 DIAGNOSIS — E1165 Type 2 diabetes mellitus with hyperglycemia: Secondary | ICD-10-CM

## 2021-01-05 DIAGNOSIS — C221 Intrahepatic bile duct carcinoma: Secondary | ICD-10-CM

## 2021-01-05 NOTE — Patient Instructions (Addendum)
Visit Information  Thank you for taking time to visit with me today. Please don't hesitate to contact me if I can be of assistance to you before our next scheduled telephone appointment.  Following are the goals we discussed today:  Attend all scheduled provider appointments Call provider office for new concerns or questions  check blood sugar at prescribed times: per Dexcom fill half of plate with vegetables limit fast food meals to no more than 1 per week wash and dry feet carefully every day wear comfortable, cotton socks wear comfortable, well-fitting shoes Wash your hands well, wear a mask as needed to prevent infection Follow up with primary care provider 12/22,  Dr. Benay Spice 12/5 Look over education sent via My Chart- hypoglycemia,  cancer Call RN care manager for any questions at [phone]  Living With Chronic Cancer Chronic cancer refers to cancers that are not cured with treatment but can be controlled for months or years with treatment. The goal of treatment with chronic cancer is to help you maintain the best quality of life possible for as long as possible. The unknowns of living with chronic cancer can be challenging. Finding healthy ways to manage your emotions will help you get through difficult times. How to manage lifestyle changes Managing stress Finding healthy ways to take care of yourself can reduce stress and anxiety. This can make a difficult situation feel easier to handle. Try some of these suggestions: Do a relaxing activity that you enjoy, such as drawing, painting, listening to music, reading, or meditating. Stay social and active, if you feel up to it. Get regular exercise. Consider trying a type of gentle mind-body exercise, such as yoga or tai chi.  Relationships Stay connected to the people in your life. You could schedule a regular day to spend time together, or you could stay in touch by phone or online. Other ways to stay connected include: Planning  a social activity each week, depending on how you feel. Making an effort to tell others about the good parts of your day, as well as the difficult parts. Exchanging entries in a common journal together. It may be easier to communicate about difficult topics by writing them down instead of saying them out loud. Texting, emailing, or video chatting with your friends and family members when you are not able to spend time together in person. Setting up a social media site where you can share news and where others can get updates and send you messages. General instructions  You may need to try different things until you find what works for you. You can start by: Letting the people in your life know how you feel. Keeping a journal to help you sort through your thoughts and emotions. Sometimes writing them down can make you feel better. Joining a support group for people who are going through the same thing as you. You may make new friends and get good information and support. It is normal to have many feelings during this time. If you have feelings of overwhelming sadness for longer than 2 weeks, let your health care provider know. You may benefit from visiting with a mental health professional. How to recognize stress Everyone is likely to feel stress while living with cancer. Be aware of the following symptoms of stress. These may include: Emotional symptoms, such as: Depression. Anxiety. Extreme anger. Relationship and behavior symptoms, such as: Isolating yourself. Fighting with others over small things. Avoiding important relationships. Follow these instructions at home: Lifestyle Do not  drink alcohol. Do not use any products that contain nicotine or tobacco, such as cigarettes, e-cigarettes, and chewing tobacco. If you need help quitting, ask your health care provider. Do your best to get enough sleep. Limit naps to short periods throughout the day. Try to get in regular, gentle exercise  each day. General instructions  Drink enough fluid to keep your urine pale yellow. Eat plenty of healthy foods, such as lean meats, whole grains, fruits, and vegetables. Consider meeting with a dietitian to talk about what you should eat and drink. Take over-the-counter and prescription medicines only as told by your health care provider. Keep all follow-up visits as told by your health care provider. This is important. Where to find support Talking to others Decide how much you want to share with others about what you are going through. Explain that sometimes you might want to talk about what you are going through, and other times you might want to focus on other things besides cancer. It is important to know that you are not alone. Consider talking with: A mental health professional or therapist who specializes in dealing with cancer. Family members. Close friends. A member of your church, faith, or community group.  Therapy and support groups Consider joining a support group. If you can, make friends who are going through a similar experience. Someone who is able to really understand what you are going through can be a good source of support and help you stay positive. If there is not a group near you, consider an online support group. Ask for support and resources from your cancer care team and caregivers. Other ways to find support Consider asking a friend or caregiver to help with errands such as grocery shopping, cleaning, and driving you to your appointments. If you are able, consider paying for these services if you do not have people to help you. Contact your local home care agencies, community agencies, and social agencies for help. Ask to meet with a palliative care specialist for help treating your pain and other symptoms. Palliative care can help you manage symptoms, promote comfort, improve quality of life, and maintain dignity. Palliative care may be offered during any phase of a  cancer diagnosis. Where to find more information American Cancer Society: www.cancer.[place]: www.cancer.gov Contact a health care provider if you: Are feeling overwhelmed with your emotions. Recognize that your stress is getting to be too much to handle on your own. Are feeling depressed. Get help right away if you: Have pain that is not controlled well with medicine. Are thinking about hurting yourself or someone else. Are feeling hopeless about treatment for the cancer, and you just want to give up. If you ever feel like you may hurt yourself or others, or have thoughts about taking your own life, get help right away. Go to your nearest emergency department or: Call your local emergency services (911 in the [place] Call a suicide crisis helpline, such as the [facility] at 4192739817 or 988 in the [place]. This is open 24 hours a day in the [place] Text the Crisis Text Line at (509)292-8895 (in the [place].). Summary Chronic cancer refers to cancers that are not cured with treatment but can be controlled for months or years with treatment. The unknowns of living with chronic cancer can be challenging. Finding healthy ways to manage your emotions will help you get through difficult times. Ask for support and resources from your cancer care team and caregivers. Ask  to meet with a palliative care specialist for help treating your pain and other symptoms. Palliative care may be offered during any phase of a cancer diagnosis. This information is not intended to replace advice given to you by your health care provider. Make sure you discuss any questions you have with your health care provider. Document Revised: 08/16/2020 Document Reviewed: 02/24/2019 Elsevier Patient Education  2022 [place]. Hypoglycemia Hypoglycemia is when the sugar (glucose) level in your blood is too low. Low blood sugar can happen to people who have diabetes and people who do not  have diabetes. Low blood sugar can happen quickly, and it can be an emergency. What are the causes? This condition happens most often in people who have diabetes. It may be caused by: Diabetes medicine. Not eating enough, or not eating often enough. Doing more physical activity. Drinking alcohol on an empty stomach. If you do not have diabetes, this condition may be caused by: A tumor in the pancreas. Not eating enough, or not eating for long periods at a time (fasting). A very bad infection or illness. Problems after having weight loss (bariatric) surgery. Kidney failure or liver failure. Certain medicines. What increases the risk? This condition is more likely to develop in people who: Have diabetes and take medicines to lower their blood sugar. Abuse alcohol. Have a very bad illness. What are the signs or symptoms? Mild Hunger. Sweating and feeling clammy. Feeling dizzy or light-headed. Being sleepy or having trouble sleeping. Feeling like you may vomit (nauseous). A fast heartbeat. A headache. Blurry vision. Mood changes, such as: Being grouchy. Feeling worried or nervous (anxious). Tingling or loss of feeling (numbness) around your mouth, lips, or tongue. Moderate Confusion and poor judgment. Behavior changes. Weakness. Uneven heartbeat. Trouble with moving (coordination). Very low Very low blood sugar (severe hypoglycemia) is a medical emergency. It can cause: Fainting. Seizures. Loss of consciousness (coma). Death. How is this treated? Treating low blood sugar Low blood sugar is often treated by eating or drinking something that has sugar in it right away. The food or drink should contain 15 grams of a fast-acting carb (carbohydrate). Options include: 4 oz (120 mL) of fruit juice. 4 oz (120   Our next appointment is by telephone on 03/02/2021 at 330 pm  Please call the care guide team at [phone] if you need to cancel or reschedule your appointment.    If you are experiencing a Mental Health or [place] or need someone to talk to, please call the [place] National Suicide Prevention Lifeline: 970-514-1897 or TTY: (503) 495-8697 TTY (419)515-9745) to talk to a trained counselor call 1-800-273-TALK (toll free, 24 hour hotline) call the [facility]: 323-832-3426 call 911   Following is a copy of your full plan of care:  Care Plan : RN Care Manager Plan of Care  Updates made by Kassie Mends, RN since 01/05/2021 12:00 AM     Problem: No plan of care established for management of chronic disease states  (Diabetes, Cancer)   Priority: High     Long-Range Goal: Development of plan of care for chronic disease management  (Diabetes, Cancer)   Start Date: 01/05/2021  Expected End Date: 07/04/2021  Priority: High  Note:   Current Barriers:  Knowledge Deficits related to plan of care for management of DMII and Cancer  - Spoke with patient and permission also given to speak with wife [name].  Patient lives with spouse and children, reports is overall independent with ADL,  IADL's, continues to drive, is currently undergoing chemotherapy managed by Dr. Benay Spice with next treatment due 12/5 for cholangiocarcinoma/ pancreatic cancer.  Patient has dexcom for blood sugar monitoring with yesterday morning reading 102, sensor is expired and getting rebooted today per pt (has not checked CBG today), pt reports when he receives chemotherapy and for 4 days afterward his blood sugar can vary 90-400 due to steroids.  Normally (not with chemotherapy) blood sugars are manageable, pt, wife verbalizes understanding of insulin regime/ sliding scale.  RNCM Clinical Goal(s):  Patient will verbalize understanding of plan for management of DMII and Cancer as evidenced by patient/ spouse report, review EHR and  through collaboration with RN Care manager, provider, and care team.   Interventions: 1:1 collaboration with primary care provider  regarding development and update of comprehensive plan of care as evidenced by provider attestation and co-signature Inter-disciplinary care team collaboration (see longitudinal plan of care) Evaluation of current treatment plan related to  self management and patient's adherence to plan as established by provider  Diabetes Interventions:  (Status:  New goal.) Long Term Goal Assessed patient's understanding of A1c goal: <7% Provided education to patient about basic DM disease process Reviewed medications with patient and discussed importance of medication adherence Counseled on importance of regular laboratory monitoring as prescribed Discussed plans with patient for ongoing care management follow up and provided patient with direct contact information for care management team Provided patient with written educational materials related to hypo and hyperglycemia and importance of correct treatment Review of patient status, including review of consultants reports, relevant laboratory and other test results, and medications completed Screening for signs and symptoms of depression related to chronic disease state  Assessed social determinant of health barriers Education sent via My Chart- hypoglycemia Lab Results  Component Value Date   HGBA1C 7.6 (H) 12/25/2020  Oncology:  (Status: New goal.) Long Term Goal Assessment of understanding of oncology diagnosis:  Assessed patient understanding of cancer diagnosis and recommended treatment plan, Reviewed upcoming provider appointments and treatment appointments, Assessed available transportation to appointments and treatments. Has consistent/reliable transportation: Yes, Assessed support system. Has consistent/reliable family or other support: Yes, PHQ2/PHQ9 performed, and Nutrition assessment performed Reviewed importance of good handwashing and wearing a mask as needed Education sent via My Chart- Cancer  Patient Goals/Self-Care Activities: Attend  all scheduled provider appointments Call provider office for new concerns or questions  check blood sugar at prescribed times: per Dexcom fill half of plate with vegetables limit fast food meals to no more than 1 per week wash and dry feet carefully every day wear comfortable, cotton socks wear comfortable, well-fitting shoes Wash your hands well, wear a mask as needed to prevent infection Follow up with primary care provider 12/22,  Dr. Benay Spice 12/5 Look over education sent via My Chart- hypoglycemia,  cancer Call RN care manager for any questions at [phone]   Follow Up Plan:  Telephone follow up appointment with care management team member scheduled for:  03/02/2021      Mr. Olsen was given information about Care Management services by the embedded care coordination team including:  Care Management services include personalized support from designated clinical staff supervised by his physician, including individualized plan of care and coordination with other care providers 24/7 contact phone numbers for assistance for urgent and routine care needs. The patient may stop CCM services at any time (effective at the end of the month) by phone call to the office staff.  Patient agreed to services and  verbal consent obtained.   Patient verbalizes understanding of instructions provided today and agrees to view in [place].   Telephone follow up appointment with care management team member scheduled for:  03/02/2021  Jacqlyn Larsen [facility], BSN RN Case Manager [facility] [phone]

## 2021-01-05 NOTE — Chronic Care Management (AMB) (Signed)
Care Management    RN Visit Note  01/05/2021 Name: Matthew Osborne MRN: [id number] DOB: 1975-08-21  Subjective: Matthew Osborne is a 45 y.o. year old male who is a primary care patient of Pickard, Cammie Mcgee, MD. The care management team was consulted for assistance with disease management and care coordination needs.    Engaged with patient by telephone for initial visit in response to provider referral for case management and/or care coordination services.   Consent to Services:   Matthew Osborne was given information about Care Management services today including:  Care Management services includes personalized support from designated clinical staff supervised by his physician, including individualized plan of care and coordination with other care providers 24/7 contact phone numbers for assistance for urgent and routine care needs. The patient may stop case management services at any time by phone call to the office staff.  Patient agreed to services and consent obtained.   Assessment: Review of patient past medical history, allergies, medications, health status, including review of consultants reports, laboratory and other test data, was performed as part of comprehensive evaluation and provision of chronic care management services.   SDOH (Social Determinants of Health) assessments and interventions performed:  SDOH Interventions    Flowsheet Row Most Recent Value  SDOH Interventions   Food Insecurity Interventions Intervention Not Indicated  Transportation Interventions Intervention Not Indicated        Care Plan  Allergies  Allergen Reactions   Niaspan [Niacin] Other (See Comments)    Flushing     Outpatient Encounter Medications as of 01/05/2021  Medication Sig Note   acetaminophen (TYLENOL) 500 MG tablet Take 1,000 mg by mouth every 4 (four) hours as needed for fever.    bisoprolol-hydrochlorothiazide (ZIAC) 10-6.25 MG tablet TAKE 1 TABLET BY MOUTH EVERY DAY IN THE MORNING  (Patient taking differently: Take 1 tablet by mouth daily.)    blood glucose meter kit and supplies KIT Fasting prior to each meal three times a day    Continuous Blood Gluc Receiver (DEXCOM G6 RECEIVER) DEVI USE AS DIRECTED    Continuous Blood Gluc Sensor (DEXCOM G6 SENSOR) MISC Use as directed to check blood sugar 5-6 times daily.    Continuous Blood Gluc Transmit (DEXCOM G6 TRANSMITTER) MISC USE AS DIRECTED TO CHECK BLOOD SUGAR 5-6 TIMES DAILY    glucose blood test strip Dispense based on patient and insurance preference.  Use twice daily as directed. (FOR ICD-10 E11.65)    ibuprofen (ADVIL) 200 MG tablet Take 800 mg by mouth every 4 (four) hours as needed for fever.    insulin glargine-yfgn (SEMGLEE, YFGN,) 100 UNIT/ML Pen Inject 70 Units into the skin 2 (two) times daily. (Patient taking differently: Inject 60-90 Units into the skin See admin instructions. Inject 90 units subcutaneously twice daily on chemo day and for 3 days therafter; inject 60 units twice daily on non-chemo days) 12/25/2020: 60 units this morning   insulin lispro (HUMALOG) 100 UNIT/ML KwikPen CHECK BG PRIOR TO MEALS & IF BG IS 150-200 2 UNITS,BG 201-250 4 UNIT,251-300 6 U,301-350 8 U, 351-400 10 U. OVER 401 12 UNIT(S) & CALL MD (Patient taking differently: 4-24 Units See admin instructions. Inject 4-24 units subcutaneously up to 8 times daily (per Dexcom readings) per sliding scale: CBG 150-200 4 units, 201-250 8 units, 251-300 12 units, 301-350 16 units, 351-400 20 units, >400 24 units and call MD) 12/25/2020: 6 units this morning   Insulin Pen Needle 31G X 5 MM MISC USE  4 PER DAY TO [place].    Insulin Syringe 27G X 1/2" 0.5 ML MISC Use as directed to inject insulin SQ Q1D.    Lancets Thin MISC Check BS BID    lidocaine-prilocaine (EMLA) cream APPLY 1 APPLICATION TOPICALLY AS DIRECTED. APPLY TO PORT SITE 1-2 HOURS PRIOR TO STICK AND COVER WITH PLASTIC WRAP. (Patient taking differently: 1 application  See admin instructions. Apply topically to port site 1-2 hours prior to access - cover with plastic wrap)    LORazepam (ATIVAN) 0.5 MG tablet Take 1 tablet (0.5 mg total) by mouth at bedtime as needed for anxiety. (Patient taking differently: Take 0.5 mg by mouth at bedtime as needed (anxiety/agitation after chemo treatments).)    ondansetron (ZOFRAN) 8 MG tablet TAKE 1 TABLET BY MOUTH EVERY 8 HOURS AS NEEDED FOR NAUSEA OR VOMITING. BEGIN 72 HOURS AFTER IV CHEMOTHERAPY TREATMENT (Patient taking differently: Take 8 mg by mouth every 8 (eight) hours as needed for nausea or vomiting. Begin 72 hours after IV chemotherapy treatment)    prochlorperazine (COMPAZINE) 10 MG tablet TAKE 1 TABLET BY MOUTH EVERY 6 HOURS AS NEEDED FOR NAUSEA (Patient taking differently: Take 10 mg by mouth every 6 (six) hours as needed for nausea or vomiting.)    pseudoephedrine (SUDAFED) 30 MG tablet Take 60 mg by mouth every 4 (four) hours as needed for congestion.    traMADol (ULTRAM) 50 MG tablet TAKE 1 TABLET BY MOUTH EVERY 6 HOURS AS NEEDED FOR MODERATE PAIN (COUGH). (Patient taking differently: Take 50 mg by mouth every 6 (six) hours as needed for moderate pain (cough).)    TRUEPLUS LANCETS 33G MISC 1 each by Does not apply route 2 (two) times daily.    No facility-administered encounter medications on file as of 01/05/2021.    Patient Active Problem List   Diagnosis Date Noted   COVID 12/25/2020   COVID-19 virus infection 12/25/2020   Genetic testing 03/24/2020   Family history of breast cancer    Family history of Hodgkin's lymphoma    Cholangiocarcinoma ([place]) 02/01/2020   Goals of care, counseling/discussion 01/21/2020   Pancreas cancer ([place]) 01/21/2020   Hypercalcemia 01/14/2020   Left elbow pain 05/19/2019   Numbness 05/19/2019   Nonproliferative retinopathy due to secondary diabetes ([place])    Allergic rhinitis 06/17/2014   Sinus congestion 05/27/2014   Chronic cough 02/14/2014   Essential hypertension,  benign 06/30/2013   Hyperlipidemia 06/30/2013   Diabetes ([place]) 06/30/2013    Conditions to be addressed/monitored: DMII and Cancer  Care Plan : RN Care Manager Plan of Care  Updates made by Matthew Mends, RN since 01/05/2021 12:00 AM     Problem: No plan of care established for management of chronic disease states  (Diabetes, Cancer)   Priority: High     Long-Range Goal: Development of plan of care for chronic disease management  (Diabetes, Cancer)   Start Date: 01/05/2021  Expected End Date: 07/04/2021  Priority: High  Note:   Current Barriers:  Knowledge Deficits related to plan of care for management of DMII and Cancer  - Spoke with patient and permission also given to speak with wife [name].  Patient lives with spouse and children, reports is overall independent with ADL, IADL's, continues to drive, is currently undergoing chemotherapy managed by Dr. Benay Spice with next treatment due 12/5 for cholangiocarcinoma/ pancreatic cancer.  Patient has dexcom for blood sugar monitoring with yesterday morning reading 102, sensor is expired and getting rebooted today per pt (  has not checked CBG today), pt reports when he receives chemotherapy and for 4 days afterward his blood sugar can vary 90-400 due to steroids.  Normally (not with chemotherapy) blood sugars are manageable, pt, wife verbalizes understanding of insulin regime/ sliding scale.  RNCM Clinical Goal(s):  Patient will verbalize understanding of plan for management of DMII and Cancer as evidenced by patient/ spouse report, review EHR and  through collaboration with RN Care manager, provider, and care team.   Interventions: 1:1 collaboration with primary care provider regarding development and update of comprehensive plan of care as evidenced by provider attestation and co-signature Inter-disciplinary care team collaboration (see longitudinal plan of care) Evaluation of current treatment plan related to  self management and patient's  adherence to plan as established by provider  Diabetes Interventions:  (Status:  New goal.) Long Term Goal Assessed patient's understanding of A1c goal: <7% Provided education to patient about basic DM disease process Reviewed medications with patient and discussed importance of medication adherence Counseled on importance of regular laboratory monitoring as prescribed Discussed plans with patient for ongoing care management follow up and provided patient with direct contact information for care management team Provided patient with written educational materials related to hypo and hyperglycemia and importance of correct treatment Review of patient status, including review of consultants reports, relevant laboratory and other test results, and medications completed Screening for signs and symptoms of depression related to chronic disease state  Assessed social determinant of health barriers Education sent via My Chart- hypoglycemia Lab Results  Component Value Date   HGBA1C 7.6 (H) 12/25/2020  Oncology:  (Status: New goal.) Long Term Goal Assessment of understanding of oncology diagnosis:  Assessed patient understanding of cancer diagnosis and recommended treatment plan, Reviewed upcoming provider appointments and treatment appointments, Assessed available transportation to appointments and treatments. Has consistent/reliable transportation: Yes, Assessed support system. Has consistent/reliable family or other support: Yes, PHQ2/PHQ9 performed, and Nutrition assessment performed Reviewed importance of good handwashing and wearing a mask as needed Education sent via My Chart- Cancer  Patient Goals/Self-Care Activities: Attend all scheduled provider appointments Call provider office for new concerns or questions  check blood sugar at prescribed times: per Dexcom fill half of plate with vegetables limit fast food meals to no more than 1 per week wash and dry feet carefully every day wear  comfortable, cotton socks wear comfortable, well-fitting shoes Wash your hands well, wear a mask as needed to prevent infection Follow up with primary care provider 12/22,  Dr. Sherrill 12/5 Look over education sent via My Chart- hypoglycemia,  cancer Call RN care manager for any questions at [phone]   Follow Up Plan:  Telephone follow up appointment with care management team member scheduled for:  03/02/2021      Plan: Telephone follow up appointment with care management team member scheduled for:  03/02/2021  Julie Farmer RNC, BSN RN Case Manager [facility] [phone]           

## 2021-01-08 ENCOUNTER — Telehealth: Payer: Self-pay

## 2021-01-08 ENCOUNTER — Inpatient Hospital Stay: Payer: No Typology Code available for payment source

## 2021-01-08 ENCOUNTER — Other Ambulatory Visit (HOSPITAL_BASED_OUTPATIENT_CLINIC_OR_DEPARTMENT_OTHER): Payer: Self-pay

## 2021-01-08 ENCOUNTER — Inpatient Hospital Stay (HOSPITAL_BASED_OUTPATIENT_CLINIC_OR_DEPARTMENT_OTHER): Payer: No Typology Code available for payment source | Admitting: Oncology

## 2021-01-08 ENCOUNTER — Inpatient Hospital Stay: Payer: No Typology Code available for payment source | Attending: Oncology

## 2021-01-08 ENCOUNTER — Other Ambulatory Visit: Payer: Self-pay

## 2021-01-08 ENCOUNTER — Other Ambulatory Visit: Payer: Self-pay | Admitting: Family Medicine

## 2021-01-08 VITALS — BP 142/83 | HR 63 | Temp 97.9°F | Resp 18 | Ht 74.0 in | Wt 238.6 lb

## 2021-01-08 VITALS — BP 165/97 | HR 69 | Temp 98.4°F | Resp 20

## 2021-01-08 DIAGNOSIS — I10 Essential (primary) hypertension: Secondary | ICD-10-CM | POA: Insufficient documentation

## 2021-01-08 DIAGNOSIS — D6959 Other secondary thrombocytopenia: Secondary | ICD-10-CM | POA: Insufficient documentation

## 2021-01-08 DIAGNOSIS — E785 Hyperlipidemia, unspecified: Secondary | ICD-10-CM | POA: Diagnosis not present

## 2021-01-08 DIAGNOSIS — Z5112 Encounter for antineoplastic immunotherapy: Secondary | ICD-10-CM | POA: Diagnosis present

## 2021-01-08 DIAGNOSIS — C221 Intrahepatic bile duct carcinoma: Secondary | ICD-10-CM | POA: Diagnosis not present

## 2021-01-08 DIAGNOSIS — E1142 Type 2 diabetes mellitus with diabetic polyneuropathy: Secondary | ICD-10-CM | POA: Insufficient documentation

## 2021-01-08 DIAGNOSIS — Z23 Encounter for immunization: Secondary | ICD-10-CM | POA: Insufficient documentation

## 2021-01-08 DIAGNOSIS — R630 Anorexia: Secondary | ICD-10-CM | POA: Diagnosis not present

## 2021-01-08 DIAGNOSIS — Z5189 Encounter for other specified aftercare: Secondary | ICD-10-CM | POA: Insufficient documentation

## 2021-01-08 DIAGNOSIS — M549 Dorsalgia, unspecified: Secondary | ICD-10-CM | POA: Diagnosis not present

## 2021-01-08 DIAGNOSIS — Z8616 Personal history of COVID-19: Secondary | ICD-10-CM | POA: Diagnosis not present

## 2021-01-08 DIAGNOSIS — Z5111 Encounter for antineoplastic chemotherapy: Secondary | ICD-10-CM | POA: Diagnosis not present

## 2021-01-08 DIAGNOSIS — R634 Abnormal weight loss: Secondary | ICD-10-CM | POA: Diagnosis not present

## 2021-01-08 DIAGNOSIS — G620 Drug-induced polyneuropathy: Secondary | ICD-10-CM | POA: Diagnosis not present

## 2021-01-08 DIAGNOSIS — R059 Cough, unspecified: Secondary | ICD-10-CM | POA: Insufficient documentation

## 2021-01-08 LAB — CBC WITH DIFFERENTIAL (CANCER CENTER ONLY)
Abs Immature Granulocytes: 0.03 10*3/uL (ref 0.00–0.07)
Basophils Absolute: 0.1 10*3/uL (ref 0.0–0.1)
Basophils Relative: 2 %
Eosinophils Absolute: 0.7 10*3/uL — ABNORMAL HIGH (ref 0.0–0.5)
Eosinophils Relative: 10 %
HCT: 33 % — ABNORMAL LOW (ref 39.0–52.0)
Hemoglobin: 10.8 g/dL — ABNORMAL LOW (ref 13.0–17.0)
Immature Granulocytes: 0 %
Lymphocytes Relative: 16 %
Lymphs Abs: 1.2 10*3/uL (ref 0.7–4.0)
MCH: 34 pg (ref 26.0–34.0)
MCHC: 32.7 g/dL (ref 30.0–36.0)
MCV: 103.8 fL — ABNORMAL HIGH (ref 80.0–100.0)
Monocytes Absolute: 0.9 10*3/uL (ref 0.1–1.0)
Monocytes Relative: 12 %
Neutro Abs: 4.4 10*3/uL (ref 1.7–7.7)
Neutrophils Relative %: 60 %
Platelet Count: 147 10*3/uL — ABNORMAL LOW (ref 150–400)
RBC: 3.18 MIL/uL — ABNORMAL LOW (ref 4.22–5.81)
RDW: 13.7 % (ref 11.5–15.5)
WBC Count: 7.3 10*3/uL (ref 4.0–10.5)
nRBC: 0 % (ref 0.0–0.2)

## 2021-01-08 LAB — CMP (CANCER CENTER ONLY)
ALT: 19 U/L (ref 0–44)
AST: 55 U/L — ABNORMAL HIGH (ref 15–41)
Albumin: 3.9 g/dL (ref 3.5–5.0)
Alkaline Phosphatase: 126 U/L (ref 38–126)
Anion gap: 4 — ABNORMAL LOW (ref 5–15)
BUN: 22 mg/dL — ABNORMAL HIGH (ref 6–20)
CO2: 29 mmol/L (ref 22–32)
Calcium: 10.4 mg/dL — ABNORMAL HIGH (ref 8.9–10.3)
Chloride: 101 mmol/L (ref 98–111)
Creatinine: 0.91 mg/dL (ref 0.61–1.24)
GFR, Estimated: >60 mL/min (ref >60)
Glucose, Bld: 215 mg/dL — ABNORMAL HIGH (ref 70–99)
Potassium: 4.2 mmol/L (ref 3.5–5.1)
Sodium: 134 mmol/L — ABNORMAL LOW (ref 135–145)
Total Bilirubin: 0.8 mg/dL (ref 0.3–1.2)
Total Protein: 7.2 g/dL (ref 6.5–8.1)

## 2021-01-08 LAB — MAGNESIUM: Magnesium: 2 mg/dL (ref 1.7–2.4)

## 2021-01-08 MED ORDER — SODIUM CHLORIDE 0.9 % IV SOLN: Freq: Once | INTRAVENOUS | Status: AC

## 2021-01-08 MED ORDER — SODIUM CHLORIDE 0.9 % IV SOLN
Freq: Once | INTRAVENOUS | Status: AC
Start: 2021-01-08 — End: 2021-01-08

## 2021-01-08 MED ORDER — MAGNESIUM SULFATE 2 GM/50ML IV SOLN
2.0000 g | Freq: Once | INTRAVENOUS | Status: AC
  Administered 2021-01-08: 2 g via INTRAVENOUS
  Filled 2021-01-08: qty 50

## 2021-01-08 MED ORDER — SODIUM CHLORIDE 0.9 % IV SOLN
800.0000 mg/m2 | Freq: Once | INTRAVENOUS | Status: AC
  Administered 2021-01-08: 1862 mg via INTRAVENOUS
  Filled 2021-01-08: qty 48.97

## 2021-01-08 MED ORDER — SODIUM CHLORIDE 0.9% FLUSH
10.0000 mL | INTRAVENOUS | Status: DC | PRN
  Administered 2021-01-08: 10 mL

## 2021-01-08 MED ORDER — SODIUM CHLORIDE 0.9 % IV SOLN
150.0000 mg | Freq: Once | INTRAVENOUS | Status: AC
  Administered 2021-01-08: 150 mg via INTRAVENOUS
  Filled 2021-01-08: qty 5

## 2021-01-08 MED ORDER — PALONOSETRON HCL INJECTION 0.25 MG/5ML
0.2500 mg | Freq: Once | INTRAVENOUS | Status: AC
  Administered 2021-01-08: 0.25 mg via INTRAVENOUS
  Filled 2021-01-08: qty 5

## 2021-01-08 MED ORDER — SODIUM CHLORIDE 0.9 % IV SOLN
25.0000 mg/m2 | Freq: Once | INTRAVENOUS | Status: AC
  Administered 2021-01-08: 59 mg via INTRAVENOUS
  Filled 2021-01-08: qty 59

## 2021-01-08 MED ORDER — POTASSIUM CHLORIDE IN NACL 20-0.9 MEQ/L-% IV SOLN
Freq: Once | INTRAVENOUS | Status: AC
  Filled 2021-01-08: qty 1000

## 2021-01-08 MED ORDER — HEPARIN SOD (PORK) LOCK FLUSH 100 UNIT/ML IV SOLN
500.0000 [IU] | Freq: Once | INTRAVENOUS | Status: AC | PRN
  Administered 2021-01-08: 500 [IU]

## 2021-01-08 MED ORDER — DEXAMETHASONE SODIUM PHOSPHATE 10 MG/ML IJ SOLN
5.0000 mg | Freq: Once | INTRAMUSCULAR | Status: AC
  Administered 2021-01-08: 5 mg via INTRAVENOUS
  Filled 2021-01-08: qty 1

## 2021-01-08 MED ORDER — SODIUM CHLORIDE 0.9 % IV SOLN
1500.0000 mg | Freq: Once | INTRAVENOUS | Status: AC
  Administered 2021-01-08: 1500 mg via INTRAVENOUS
  Filled 2021-01-08: qty 30

## 2021-01-08 MED ORDER — FAMOTIDINE 20 MG IN NS 100 ML IVPB
20.0000 mg | Freq: Once | INTRAVENOUS | Status: AC
  Administered 2021-01-08: 20 mg via INTRAVENOUS
  Filled 2021-01-08: qty 100

## 2021-01-08 NOTE — Telephone Encounter (Signed)
Patient seen by Dr. Sherrill today ? ?Vitals are within treatment parameters. ? ?Labs reviewed by Dr. Sherrill and are within treatment parameters. ? ?Per physician team, patient is ready for treatment and there are NO modifications to the treatment plan.  ?

## 2021-01-08 NOTE — Patient Instructions (Signed)
Matthew Osborne   Discharge Instructions: Thank you for choosing [facility] to provide your oncology and hematology care.   If you have a lab appointment with the [facility], please go directly to the [facility] and check in at the registration area.   Wear comfortable clothing and clothing appropriate for easy access to any Portacath or PICC line.   We strive to give you quality time with your provider. You may need to reschedule your appointment if you arrive late (15 or more minutes).  Arriving late affects you and other patients whose appointments are after yours.  Also, if you miss three or more appointments without notifying the office, you may be dismissed from the clinic at the provider's discretion.      For prescription refill requests, have your pharmacy contact our office and allow 72 hours for refills to be completed.    Today you received the following chemotherapy and/or immunotherapy agents Durvalumab (IMFINZI), Gemcitabine (GEMZAR) & Cisplatin (PLATINOL).      To help prevent nausea and vomiting after your treatment, we encourage you to take your nausea medication as directed.  BELOW ARE SYMPTOMS THAT SHOULD BE REPORTED IMMEDIATELY: *FEVER GREATER THAN 100.4 F (38 C) OR HIGHER *CHILLS OR SWEATING *NAUSEA AND VOMITING THAT IS NOT CONTROLLED WITH YOUR NAUSEA MEDICATION *UNUSUAL SHORTNESS OF BREATH *UNUSUAL BRUISING OR BLEEDING *URINARY PROBLEMS (pain or burning when urinating, or frequent urination) *BOWEL PROBLEMS (unusual diarrhea, constipation, pain near the anus) TENDERNESS IN MOUTH AND THROAT WITH OR WITHOUT PRESENCE OF ULCERS (sore throat, sores in mouth, or a toothache) UNUSUAL RASH, SWELLING OR PAIN  UNUSUAL VAGINAL DISCHARGE OR ITCHING   Items with * indicate a potential emergency and should be followed up as soon as possible or go to the Emergency Department if any problems should occur.  Please show the CHEMOTHERAPY  ALERT CARD or IMMUNOTHERAPY ALERT CARD at check-in to the Emergency Department and triage nurse.  Should you have questions after your visit or need to cancel or reschedule your appointment, please contact [facility]  Dept: [phone]  and follow the prompts.  Office hours are 8:00 a.m. to 4:30 p.m. Monday - Friday. Please note that voicemails left after 4:00 p.m. may not be returned until the following business day.  We are closed weekends and major holidays. You have access to a nurse at all times for urgent questions. Please call the main number to the clinic Dept: [phone] and follow the prompts.   For any non-urgent questions, you may also contact your provider using MyChart. We now offer e-Visits for anyone 13 and older to request care online for non-urgent symptoms. For details visit [email or web address].   Also download the MyChart app! Go to the app store, search "MyChart", open the app, select [facility], and log in with your MyChart username and password.  Due to Covid, a mask is required upon entering the hospital/clinic. If you do not have a mask, one will be given to you upon arrival. For doctor visits, patients may have 1 support person aged 49 or older with them. For treatment visits, patients cannot have anyone with them due to current Covid guidelines and our immunocompromised population.   Durvalumab injection What is this medication? DURVALUMAB (dur VAL ue mab) is a monoclonal antibody. It is used to treat lung cancer. This medicine may be used for other purposes; ask your health care provider or pharmacist if you have questions. COMMON  BRAND NAME(S): IMFINZI What should I tell my care team before I take this medication? They need to know if you have any of these conditions: autoimmune diseases like Crohn's disease, ulcerative colitis, or lupus have had or planning to have an allogeneic stem cell transplant (uses someone else's stem  cells) history of organ transplant history of radiation to the chest nervous system problems like myasthenia gravis or Guillain-Barre syndrome an unusual or allergic reaction to durvalumab, other medicines, foods, dyes, or preservatives pregnant or trying to get pregnant breast-feeding How should I use this medication? This medicine is for infusion into a vein. It is given by a health care professional in a hospital or clinic setting. A special MedGuide will be given to you before each treatment. Be sure to read this information carefully each time. Talk to your pediatrician regarding the use of this medicine in children. Special care may be needed. Overdosage: If you think you have taken too much of this medicine contact a poison control center or emergency room at once. NOTE: This medicine is only for you. Do not share this medicine with others. What if I miss a dose? It is important not to miss your dose. Call your doctor or health care professional if you are unable to keep an appointment. What may interact with this medication? Interactions have not been studied. This list may not describe all possible interactions. Give your health care provider a list of all the medicines, herbs, non-prescription drugs, or dietary supplements you use. Also tell them if you smoke, drink alcohol, or use illegal drugs. Some items may interact with your medicine. What should I watch for while using this medication? This medication may make you feel generally unwell. Continue your course of treatment even though you feel ill unless your care team tells you to stop. You may need blood work done while you are taking this medication. Do not become pregnant while taking this medication or for 3 months after stopping it. Women should inform their care team if they wish to become pregnant or think they might be pregnant. There is a potential for serious side effects to an unborn child. Talk to your care team or  pharmacist for more information. Do not breast-feed an infant while taking this medication or for 3 months after stopping it. What side effects may I notice from receiving this medication? Side effects that you should report to your care team as soon as possible: Allergic reactions--skin rash, itching, hives, swelling of the face, lips, tongue, or throat Bloody or watery diarrhea Dizziness, loss of balance or coordination, confusion or trouble speaking Dry cough, shortness of breath or trouble breathing Flushing, mostly over the face, neck, and chest, during injection High blood sugar (hyperglycemia)--increased thirst or amount of urine, unusual weakness or fatigue, blurry vision High thyroid levels (hyperthyroidism)--fast or irregular heartbeat, weight loss, excessive sweating or sensitivity to heat, tremors or shaking, anxiety, nervousness, irregular menstrual cycle or spotting Infection--fever, chills, cough, or sore throat Liver injury--right upper belly pain, loss of appetite, nausea, light-colored stool, dark yellow or brown urine, yellowing skin or eyes, unusual weakness or fatigue Low adrenal gland function--nausea, vomiting, loss of appetite, unusual weakness or fatigue, dizziness, low blood pressure Low thyroid levels (hypothyroidism)--unusual weakness or fatigue, increased sensitivity to cold, constipation, hair loss, dry skin, weight gain, feelings of depression Pancreatitis--severe stomach pain that spreads to your back or gets worse after eating or when touched, fever, nausea, vomiting Rash, fever, and swollen lymph nodes Redness, blistering,  peeling or loosening of the skin, including inside the mouth Wheezing--trouble breathing with loud or whistling sounds Side effects that usually do not require medical attention (report these to your care team if they continue or are bothersome): Fatigue Hair loss This list may not describe all possible side effects. Call your doctor for  medical advice about side effects. You may report side effects to FDA at 1-800-FDA-1088. Where should I keep my medication? This medication is given in a hospital or clinic. It will not be stored at home. NOTE: This sheet is a summary. It may not cover all possible information. If you have questions about this medicine, talk to your doctor, pharmacist, or health care provider.  2022 Elsevier/Gold Standard (2020-10-10 00:00:00)  Gemcitabine injection What is this medication? GEMCITABINE (jem SYE ta been) is a chemotherapy drug. This medicine is used to treat many types of cancer like breast cancer, lung cancer, pancreatic cancer, and ovarian cancer. This medicine may be used for other purposes; ask your health care provider or pharmacist if you have questions. COMMON BRAND NAME(S): Gemzar, Infugem What should I tell my care team before I take this medication? They need to know if you have any of these conditions: blood disorders infection kidney disease liver disease lung or breathing disease, like asthma recent or ongoing radiation therapy an unusual or allergic reaction to gemcitabine, other chemotherapy, other medicines, foods, dyes, or preservatives pregnant or trying to get pregnant breast-feeding How should I use this medication? This drug is given as an infusion into a vein. It is administered in a hospital or clinic by a specially trained health care professional. Talk to your pediatrician regarding the use of this medicine in children. Special care may be needed. Overdosage: If you think you have taken too much of this medicine contact a poison control center or emergency room at once. NOTE: This medicine is only for you. Do not share this medicine with others. What if I miss a dose? It is important not to miss your dose. Call your doctor or health care professional if you are unable to keep an appointment. What may interact with this medication? medicines to increase blood  counts like filgrastim, pegfilgrastim, sargramostim some other chemotherapy drugs like cisplatin vaccines Talk to your doctor or health care professional before taking any of these medicines: acetaminophen aspirin ibuprofen ketoprofen naproxen This list may not describe all possible interactions. Give your health care provider a list of all the medicines, herbs, non-prescription drugs, or dietary supplements you use. Also tell them if you smoke, drink alcohol, or use illegal drugs. Some items may interact with your medicine. What should I watch for while using this medication? Visit your doctor for checks on your progress. This drug may make you feel generally unwell. This is not uncommon, as chemotherapy can affect healthy cells as well as cancer cells. Report any side effects. Continue your course of treatment even though you feel ill unless your doctor tells you to stop. In some cases, you may be given additional medicines to help with side effects. Follow all directions for their use. Call your doctor or health care professional for advice if you get a fever, chills or sore throat, or other symptoms of a cold or flu. Do not treat yourself. This drug decreases your body's ability to fight infections. Try to avoid being around people who are sick. This medicine may increase your risk to bruise or bleed. Call your doctor or health care professional if you notice any  unusual bleeding. Be careful brushing and flossing your teeth or using a toothpick because you may get an infection or bleed more easily. If you have any dental work done, tell your dentist you are receiving this medicine. Avoid taking products that contain aspirin, acetaminophen, ibuprofen, naproxen, or ketoprofen unless instructed by your doctor. These medicines may hide a fever. Do not become pregnant while taking this medicine or for 6 months after stopping it. Women should inform their doctor if they wish to become pregnant or  think they might be pregnant. Men should not father a child while taking this medicine and for 3 months after stopping it. There is a potential for serious side effects to an unborn child. Talk to your health care professional or pharmacist for more information. Do not breast-feed an infant while taking this medicine or for at least 1 week after stopping it. Men should inform their doctors if they wish to father a child. This medicine may lower sperm counts. Talk with your doctor or health care professional if you are concerned about your fertility. What side effects may I notice from receiving this medication? Side effects that you should report to your doctor or health care professional as soon as possible: allergic reactions like skin rash, itching or hives, swelling of the face, lips, or tongue breathing problems pain, redness, or irritation at site where injected signs and symptoms of a dangerous change in heartbeat or heart rhythm like chest pain; dizziness; fast or irregular heartbeat; palpitations; feeling faint or lightheaded, falls; breathing problems signs of decreased platelets or bleeding - bruising, pinpoint red spots on the skin, black, tarry stools, blood in the urine signs of decreased red blood cells - unusually weak or tired, feeling faint or lightheaded, falls signs of infection - fever or chills, cough, sore throat, pain or difficulty passing urine signs and symptoms of kidney injury like trouble passing urine or change in the amount of urine signs and symptoms of liver injury like dark yellow or brown urine; general ill feeling or flu-like symptoms; light-colored stools; loss of appetite; nausea; right upper belly pain; unusually weak or tired; yellowing of the eyes or skin swelling of ankles, feet, hands Side effects that usually do not require medical attention (report to your doctor or health care professional if they continue or are bothersome): constipation diarrhea hair  loss loss of appetite nausea rash vomiting This list may not describe all possible side effects. Call your doctor for medical advice about side effects. You may report side effects to FDA at 1-800-FDA-1088. Where should I keep my medication? This drug is given in a hospital or clinic and will not be stored at home. NOTE: This sheet is a summary. It may not cover all possible information. If you have questions about this medicine, talk to your doctor, pharmacist, or health care provider.  2022 Elsevier/Gold Standard (2017-04-16 00:00:00)  Cisplatin injection What is this medication? CISPLATIN (SIS pla tin) is a chemotherapy drug. It targets fast dividing cells, like cancer cells, and causes these cells to die. This medicine is used to treat many types of cancer like bladder, ovarian, and testicular cancers. This medicine may be used for other purposes; ask your health care provider or pharmacist if you have questions. COMMON BRAND NAME(S): Platinol, Platinol -AQ What should I tell my care team before I take this medication? They need to know if you have any of these conditions: eye disease, vision problems hearing problems kidney disease low blood counts, like white  cells, platelets, or red blood cells tingling of the fingers or toes, or other nerve disorder an unusual or allergic reaction to cisplatin, carboplatin, oxaliplatin, other medicines, foods, dyes, or preservatives pregnant or trying to get pregnant breast-feeding How should I use this medication? This drug is given as an infusion into a vein. It is administered in a hospital or clinic by a specially trained health care professional. Talk to your pediatrician regarding the use of this medicine in children. Special care may be needed. Overdosage: If you think you have taken too much of this medicine contact a poison control center or emergency room at once. NOTE: This medicine is only for you. Do not share this medicine with  others. What if I miss a dose? It is important not to miss a dose. Call your doctor or health care professional if you are unable to keep an appointment. What may interact with this medication? This medicine may interact with the following medications: foscarnet certain antibiotics like amikacin, gentamicin, neomycin, polymyxin B, streptomycin, tobramycin, vancomycin This list may not describe all possible interactions. Give your health care provider a list of all the medicines, herbs, non-prescription drugs, or dietary supplements you use. Also tell them if you smoke, drink alcohol, or use illegal drugs. Some items may interact with your medicine. What should I watch for while using this medication? Your condition will be monitored carefully while you are receiving this medicine. You will need important blood work done while you are taking this medicine. This drug may make you feel generally unwell. This is not uncommon, as chemotherapy can affect healthy cells as well as cancer cells. Report any side effects. Continue your course of treatment even though you feel ill unless your doctor tells you to stop. This medicine may increase your risk of getting an infection. Call your healthcare professional for advice if you get a fever, chills, or sore throat, or other symptoms of a cold or flu. Do not treat yourself. Try to avoid being around people who are sick. Avoid taking medicines that contain aspirin, acetaminophen, ibuprofen, naproxen, or ketoprofen unless instructed by your healthcare professional. These medicines may hide a fever. This medicine may increase your risk to bruise or bleed. Call your doctor or health care professional if you notice any unusual bleeding. Be careful brushing and flossing your teeth or using a toothpick because you may get an infection or bleed more easily. If you have any dental work done, tell your dentist you are receiving this medicine. Do not become pregnant while  taking this medicine or for 14 months after stopping it. Women should inform their healthcare professional if they wish to become pregnant or think they might be pregnant. Men should not father a child while taking this medicine and for 11 months after stopping it. There is potential for serious side effects to an unborn child. Talk to your healthcare professional for more information. Do not breast-feed an infant while taking this medicine. This medicine has caused ovarian failure in some women. This medicine may make it more difficult to get pregnant. Talk to your healthcare professional if you are concerned about your fertility. This medicine has caused decreased sperm counts in some men. This may make it more difficult to father a child. Talk to your healthcare professional if you are concerned about your fertility. Drink fluids as directed while you are taking this medicine. This will help protect your kidneys. Call your doctor or health care professional if you get diarrhea. Do  not treat yourself. What side effects may I notice from receiving this medication? Side effects that you should report to your doctor or health care professional as soon as possible: allergic reactions like skin rash, itching or hives, swelling of the face, lips, or tongue blurred vision changes in vision decreased hearing or ringing of the ears nausea, vomiting pain, redness, or irritation at site where injected pain, tingling, numbness in the hands or feet signs and symptoms of bleeding such as bloody or black, tarry stools; red or dark brown urine; spitting up blood or brown material that looks like coffee grounds; red spots on the skin; unusual bruising or bleeding from the eyes, gums, or nose signs and symptoms of infection like fever; chills; cough; sore throat; pain or trouble passing urine signs and symptoms of kidney injury like trouble passing urine or change in the amount of urine signs and symptoms of low  red blood cells or anemia such as unusually weak or tired; feeling faint or lightheaded; falls; breathing problems Side effects that usually do not require medical attention (report to your doctor or health care professional if they continue or are bothersome): loss of appetite mouth sores muscle cramps This list may not describe all possible side effects. Call your doctor for medical advice about side effects. You may report side effects to FDA at 1-800-FDA-1088. Where should I keep my medication? This drug is given in a hospital or clinic and will not be stored at home. NOTE: This sheet is a summary. It may not cover all possible information. If you have questions about this medicine, talk to your doctor, pharmacist, or health care provider.  2022 Elsevier/Gold Standard (2020-10-10 00:00:00)

## 2021-01-08 NOTE — Progress Notes (Signed)
[facility] OFFICE PROGRESS NOTE   Diagnosis: Cholangiocarcinoma  INTERVAL HISTORY:   Matthew Osborne completed another cycle of gemcitabine/cisplatin beginning 12/04/2020.  He reports improvement in neuropathy symptoms. He was diagnosed with COVID-19 infection when he presented with a fever, cough, and nausea/vomiting on 12/25/2020.  He was admitted overnight.  He continues to have an intermittent cough.  No other complaint.  Objective:  Vital signs in last 24 hours:  Blood pressure (!) 142/83, pulse 63, temperature 97.9 F (36.6 C), temperature source Oral, resp. rate 18, height _0  (1.88 m), weight 238 lb 9.6 oz (108.2 kg), SpO2 99 %.    HEENT: White coat over the tongue, no buccal thrush or ulcers Resp: Lungs clear bilaterally Cardio: Regular rate and rhythm GI: No splenomegaly, the liver edge is palpable at the medial right costal margin with associated tenderness Vascular: No leg edema   Portacath/PICC-without erythema  Lab Results:  Lab Results  Component Value Date   WBC 7.3 01/08/2021   HGB 10.8 (L) 01/08/2021   HCT 33.0 (L) 01/08/2021   MCV 103.8 (H) 01/08/2021   PLT 147 (L) 01/08/2021   NEUTROABS 4.4 01/08/2021    CMP  Lab Results  Component Value Date   NA 134 (L) 01/08/2021   K 4.2 01/08/2021   CL 101 01/08/2021   CO2 29 01/08/2021   GLUCOSE 215 (H) 01/08/2021   BUN 22 (H) 01/08/2021   CREATININE 0.91 01/08/2021   CALCIUM 10.4 (H) 01/08/2021   PROT 7.2 01/08/2021   ALBUMIN 3.9 01/08/2021   AST 55 (H) 01/08/2021   ALT 19 01/08/2021   ALKPHOS 126 01/08/2021   BILITOT 0.8 01/08/2021   GFRNONAA >60 01/08/2021   GFRAA 127 01/11/2020    Lab Results  Component Value Date   CEA1 2.15 01/14/2020   ZOX096 82 (H) 08/21/2020      Medications: I have reviewed the patient's current medications.   Assessment/Plan: Cholangiocarcinoma  multiple liver masses and abdominal lymphadenopathy CTs 01/13/2020-rounded hypodense mass appears to  arise from the pancreas neck, multiple rim-enhancing masses in the liver, primarily left liver with segmental dilation of the left lobe Intermatic bile ducts, ill-defined hypodensity the central liver with effacement of the left portal vein, enlarged portacaval and retroperitoneal lymph nodes Ultrasound-guided biopsy of the left liver lesion 01/19/2020-adenocarcinoma, cytokeratin 7+, MSS, tumor mutation burden 1, IDH1 R132C MRI abdomen 01/31/2020-poorly marginated central liver mass, multiple smaller similar satellite liver masses, extrinsic mass-effect at the biliary hilum with intrahepatic biliary ductal dilatation throughout the left liver with mild Intermatic dilatation the superior right liver, no pancreas mass or ductal dilatation, normal spleen size, numerous enlarged enhancing lymph nodes at the porta hepatis, peripancreatic, portacaval, aortocaval, left periaortic chains Cycle 1 gemcitabine/cisplatin 02/04/2020 Cycle 2 gemcitabine/cisplatin 02/28/2020 Cycle 3 gemcitabine/cisplatin 03/20/2020 CT abdomen/pelvis 03/31/2020-mild decrease in central liver tumor, mild decrease in size of liver metastases and upper abdominal adenopathy, new splenomegaly Cycle 4 gemcitabine/cisplatin 04/10/2020 Cycle 5 gemcitabine/cisplatin 05/01/2020 Cycle 6 gemcitabine/cisplatin plus durvalumab 05/22/2020 CTs 06/06/2020- dominant central liver mass mildly enlarged, other liver lesions and abdominal adenopathy is stable, no evidence of metastatic disease to the chest Cycle 7 gemcitabine/cisplatin plus Durvalumab 06/12/2020 Cycle 8 gemcitabine/cisplatin plus Durvalumab 06/30/2020 Cycle 9 gemcitabine/cisplatin plus Durvalumab 07/31/2020 CT abdomen/pelvis 08/20/2020-decreased size of dominant central liver mass, slight increase in size and stable additional liver lesions, stable portacaval node Cycle 10 gemcitabine/cisplatin plus Durvalumab 08/21/2020 Cycle 11 gemcitabine/cisplatin plus Durvalumab 09/11/2020 Cycle 12  gemcitabine/cisplatin plus Durvalumab 10/02/2020 Cycle 13 gemcitabine/cisplatin plus Durvalumab  10/23/2020 CT abdomen/pelvis 11/10/2020-no change in dominant central liver mass and additional liver lesions, stable upper retroperitoneal adenopathy Cycle 14 gemcitabine/cisplatin plus Durvalumab 11/13/2020 Cycle 15 gemcitabine/cisplatin plus Durvalumab 12/04/2020 Cycle 15 gemcitabine/cisplatin plus Durvalumab 01/08/2021   Cough-likely related to diaphragmatic irritation from #1 Anorexia/weight loss Hypercalcemia-likely hypercalcemia malignancy, status post intravenous hydration and Zometa 01/14/2020 Diabetes Hyperlipidemia Hypertension History of peripheral neuropathy secondary to diabetes Severe back pain following Fulphila-oxycodone prescribed Thrombocytopenia secondary to chemotherapy-gemcitabine held with day 1 cycle 8 and then dose reduced COVID-19 infection 07/22/2020, 12/25/2020        Disposition: Matthew Osborne appears stable.  He will complete another cycle of gemcitabine/cisplatin plus Durvalumab today.  He will return for an office visit and chemotherapy in 3 weeks.  He will be referred for a restaging CT evaluation after the next cycle of chemotherapy.    Betsy Coder, MD  01/08/2021  9:24 AM

## 2021-01-08 NOTE — Progress Notes (Signed)
Patient presents for treatment. RN assessment completed along with the following:  Labs/vitals reviewed - Yes, and within treatment parameters.   Weight within 10% of previous measurement - Yes Oncology Treatment Attestation completed for current therapy- Yes, on date 02/01/2020 Informed consent completed and reflects current therapy/intent - Yes, on date 02/04/2020 Gemzar/Cisplatin; 06/12/2020 Imfinzi             Provider progress note reviewed - Yes, today's provider note was reviewed. Treatment/Antibody/Supportive plan reviewed - Yes, and there are no adjustments needed for today's treatment. S&H and other orders reviewed - Yes, and there are no additional orders identified. Previous treatment date reviewed - Yes, and the appropriate amount of time has elapsed between treatments. Clinic Hand Off Received from - No.  Patient to proceed with treatment.

## 2021-01-09 ENCOUNTER — Encounter: Payer: Self-pay | Admitting: *Deleted

## 2021-01-09 ENCOUNTER — Other Ambulatory Visit (HOSPITAL_BASED_OUTPATIENT_CLINIC_OR_DEPARTMENT_OTHER): Payer: Self-pay

## 2021-01-09 MED ORDER — DEXCOM G6 SENSOR MISC
0 refills | Status: DC
  Filled 2021-01-09 – 2021-01-18 (×2): qty 9, 90d supply, fill #0

## 2021-01-09 MED ORDER — UNIFINE PENTIPS 31G X 5 MM MISC
11 refills | Status: DC
Start: 2021-01-09 — End: 2021-04-04
  Filled 2021-01-09: qty 100, 25d supply, fill #0
  Filled 2021-02-15: qty 100, 25d supply, fill #1
  Filled 2021-03-26: qty 100, 25d supply, fill #2
  Filled 2021-04-04: qty 100, 25d supply, fill #3

## 2021-01-09 NOTE — Progress Notes (Signed)
Faxed 01/08/21 office note, labs and med list to [facility] att: Wilford Sports, LPN> Fax #[phone]

## 2021-01-15 ENCOUNTER — Other Ambulatory Visit: Payer: Self-pay

## 2021-01-15 ENCOUNTER — Inpatient Hospital Stay: Payer: No Typology Code available for payment source

## 2021-01-15 VITALS — BP 152/97 | HR 62 | Temp 98.2°F | Resp 20 | Ht 74.0 in | Wt 237.1 lb

## 2021-01-15 DIAGNOSIS — C221 Intrahepatic bile duct carcinoma: Secondary | ICD-10-CM

## 2021-01-15 DIAGNOSIS — Z5111 Encounter for antineoplastic chemotherapy: Secondary | ICD-10-CM | POA: Diagnosis not present

## 2021-01-15 LAB — CBC WITH DIFFERENTIAL (CANCER CENTER ONLY)
Abs Immature Granulocytes: 0 10*3/uL (ref 0.00–0.07)
Basophils Absolute: 0.1 10*3/uL (ref 0.0–0.1)
Basophils Relative: 2 %
Eosinophils Absolute: 0.2 10*3/uL (ref 0.0–0.5)
Eosinophils Relative: 5 %
HCT: 31.8 % — ABNORMAL LOW (ref 39.0–52.0)
Hemoglobin: 10.6 g/dL — ABNORMAL LOW (ref 13.0–17.0)
Immature Granulocytes: 0 %
Lymphocytes Relative: 30 %
Lymphs Abs: 0.9 10*3/uL (ref 0.7–4.0)
MCH: 34.1 pg — ABNORMAL HIGH (ref 26.0–34.0)
MCHC: 33.3 g/dL (ref 30.0–36.0)
MCV: 102.3 fL — ABNORMAL HIGH (ref 80.0–100.0)
Monocytes Absolute: 0.3 10*3/uL (ref 0.1–1.0)
Monocytes Relative: 11 %
Neutro Abs: 1.6 10*3/uL — ABNORMAL LOW (ref 1.7–7.7)
Neutrophils Relative %: 52 %
Platelet Count: 98 10*3/uL — ABNORMAL LOW (ref 150–400)
RBC: 3.11 MIL/uL — ABNORMAL LOW (ref 4.22–5.81)
RDW: 13.2 % (ref 11.5–15.5)
WBC Count: 3.1 10*3/uL — ABNORMAL LOW (ref 4.0–10.5)
nRBC: 0 % (ref 0.0–0.2)

## 2021-01-15 LAB — CMP (CANCER CENTER ONLY)
ALT: 34 U/L (ref 0–44)
AST: 49 U/L — ABNORMAL HIGH (ref 15–41)
Albumin: 3.9 g/dL (ref 3.5–5.0)
Alkaline Phosphatase: 127 U/L — ABNORMAL HIGH (ref 38–126)
Anion gap: 5 (ref 5–15)
BUN: 20 mg/dL (ref 6–20)
CO2: 30 mmol/L (ref 22–32)
Calcium: 10.5 mg/dL — ABNORMAL HIGH (ref 8.9–10.3)
Chloride: 99 mmol/L (ref 98–111)
Creatinine: 0.95 mg/dL (ref 0.61–1.24)
GFR, Estimated: >60 mL/min (ref >60)
Glucose, Bld: 188 mg/dL — ABNORMAL HIGH (ref 70–99)
Potassium: 4.2 mmol/L (ref 3.5–5.1)
Sodium: 134 mmol/L — ABNORMAL LOW (ref 135–145)
Total Bilirubin: 0.7 mg/dL (ref 0.3–1.2)
Total Protein: 7.7 g/dL (ref 6.5–8.1)

## 2021-01-15 LAB — MAGNESIUM: Magnesium: 1.8 mg/dL (ref 1.7–2.4)

## 2021-01-15 MED ORDER — FAMOTIDINE 20 MG IN NS 100 ML IVPB
20.0000 mg | Freq: Once | INTRAVENOUS | Status: AC
  Administered 2021-01-15: 20 mg via INTRAVENOUS
  Filled 2021-01-15: qty 100

## 2021-01-15 MED ORDER — SODIUM CHLORIDE 0.9 % IV SOLN
25.0000 mg/m2 | Freq: Once | INTRAVENOUS | Status: AC
  Administered 2021-01-15: 59 mg via INTRAVENOUS
  Filled 2021-01-15: qty 59

## 2021-01-15 MED ORDER — SODIUM CHLORIDE 0.9 % IV SOLN
150.0000 mg | Freq: Once | INTRAVENOUS | Status: AC
  Administered 2021-01-15: 150 mg via INTRAVENOUS
  Filled 2021-01-15: qty 5

## 2021-01-15 MED ORDER — SODIUM CHLORIDE 0.9 % IV SOLN
800.0000 mg/m2 | Freq: Once | INTRAVENOUS | Status: AC
  Administered 2021-01-15: 1862 mg via INTRAVENOUS
  Filled 2021-01-15: qty 48.97

## 2021-01-15 MED ORDER — SODIUM CHLORIDE 0.9 % IV SOLN: Freq: Once | INTRAVENOUS | Status: AC

## 2021-01-15 MED ORDER — HEPARIN SOD (PORK) LOCK FLUSH 100 UNIT/ML IV SOLN
500.0000 [IU] | Freq: Once | INTRAVENOUS | Status: AC | PRN
  Administered 2021-01-15: 500 [IU]

## 2021-01-15 MED ORDER — SODIUM CHLORIDE 0.9% FLUSH
10.0000 mL | INTRAVENOUS | Status: DC | PRN
  Administered 2021-01-15: 10 mL

## 2021-01-15 MED ORDER — PALONOSETRON HCL INJECTION 0.25 MG/5ML
0.2500 mg | Freq: Once | INTRAVENOUS | Status: AC
  Administered 2021-01-15: 0.25 mg via INTRAVENOUS
  Filled 2021-01-15: qty 5

## 2021-01-15 MED ORDER — DEXAMETHASONE SODIUM PHOSPHATE 10 MG/ML IJ SOLN
5.0000 mg | Freq: Once | INTRAMUSCULAR | Status: AC
  Administered 2021-01-15: 5 mg via INTRAVENOUS
  Filled 2021-01-15: qty 1

## 2021-01-15 MED ORDER — MAGNESIUM SULFATE 2 GM/50ML IV SOLN
2.0000 g | Freq: Once | INTRAVENOUS | Status: AC
  Administered 2021-01-15: 2 g via INTRAVENOUS
  Filled 2021-01-15: qty 50

## 2021-01-15 MED ORDER — POTASSIUM CHLORIDE IN NACL 20-0.9 MEQ/L-% IV SOLN
Freq: Once | INTRAVENOUS | Status: AC
  Filled 2021-01-15: qty 1000

## 2021-01-15 NOTE — Patient Instructions (Signed)
[facility]   Discharge Instructions: Thank you for choosing [facility] to provide your oncology and hematology care.   If you have a lab appointment with the [facility], please go directly to the [facility] and check in at the registration area.   Wear comfortable clothing and clothing appropriate for easy access to any Portacath or PICC line.   We strive to give you quality time with your provider. You may need to reschedule your appointment if you arrive late (15 or more minutes).  Arriving late affects you and other patients whose appointments are after yours.  Also, if you miss three or more appointments without notifying the office, you may be dismissed from the clinic at the provider's discretion.      For prescription refill requests, have your pharmacy contact our office and allow 72 hours for refills to be completed.    Today you received the following chemotherapy and/or immunotherapy agents Gemcitabine (GEMZAR) & Cisplatin (PLATINOL).      To help prevent nausea and vomiting after your treatment, we encourage you to take your nausea medication as directed.  BELOW ARE SYMPTOMS THAT SHOULD BE REPORTED IMMEDIATELY: *FEVER GREATER THAN 100.4 F (38 C) OR HIGHER *CHILLS OR SWEATING *NAUSEA AND VOMITING THAT IS NOT CONTROLLED WITH YOUR NAUSEA MEDICATION *UNUSUAL SHORTNESS OF BREATH *UNUSUAL BRUISING OR BLEEDING *URINARY PROBLEMS (pain or burning when urinating, or frequent urination) *BOWEL PROBLEMS (unusual diarrhea, constipation, pain near the anus) TENDERNESS IN MOUTH AND THROAT WITH OR WITHOUT PRESENCE OF ULCERS (sore throat, sores in mouth, or a toothache) UNUSUAL RASH, SWELLING OR PAIN  UNUSUAL VAGINAL DISCHARGE OR ITCHING   Items with * indicate a potential emergency and should be followed up as soon as possible or go to the Emergency Department if any problems should occur.  Please show the CHEMOTHERAPY ALERT CARD or  IMMUNOTHERAPY ALERT CARD at check-in to the Emergency Department and triage nurse.  Should you have questions after your visit or need to cancel or reschedule your appointment, please contact [facility]  Dept: [phone]  and follow the prompts.  Office hours are 8:00 a.m. to 4:30 p.m. Monday - Friday. Please note that voicemails left after 4:00 p.m. may not be returned until the following business day.  We are closed weekends and major holidays. You have access to a nurse at all times for urgent questions. Please call the main number to the clinic Dept: [phone] and follow the prompts.   For any non-urgent questions, you may also contact your provider using MyChart. We now offer e-Visits for anyone 73 and older to request care online for non-urgent symptoms. For details visit [email or web address].   Also download the MyChart app! Go to the app store, search "MyChart", open the app, select [facility], and log in with your MyChart username and password.  Due to Covid, a mask is required upon entering the hospital/clinic. If you do not have a mask, one will be given to you upon arrival. For doctor visits, patients may have 1 support person aged 51 or older with them. For treatment visits, patients cannot have anyone with them due to current Covid guidelines and our immunocompromised population.   Gemcitabine injection What is this medication? GEMCITABINE (jem SYE ta been) is a chemotherapy drug. This medicine is used to treat many types of cancer like breast cancer, lung cancer, pancreatic cancer, and ovarian cancer. This medicine may be used for other purposes; ask  your health care provider or pharmacist if you have questions. COMMON BRAND NAME(S): Gemzar, Infugem What should I tell my care team before I take this medication? They need to know if you have any of these conditions: blood disorders infection kidney disease liver disease lung or breathing  disease, like asthma recent or ongoing radiation therapy an unusual or allergic reaction to gemcitabine, other chemotherapy, other medicines, foods, dyes, or preservatives pregnant or trying to get pregnant breast-feeding How should I use this medication? This drug is given as an infusion into a vein. It is administered in a hospital or clinic by a specially trained health care professional. Talk to your pediatrician regarding the use of this medicine in children. Special care may be needed. Overdosage: If you think you have taken too much of this medicine contact a poison control center or emergency room at once. NOTE: This medicine is only for you. Do not share this medicine with others. What if I miss a dose? It is important not to miss your dose. Call your doctor or health care professional if you are unable to keep an appointment. What may interact with this medication? medicines to increase blood counts like filgrastim, pegfilgrastim, sargramostim some other chemotherapy drugs like cisplatin vaccines Talk to your doctor or health care professional before taking any of these medicines: acetaminophen aspirin ibuprofen ketoprofen naproxen This list may not describe all possible interactions. Give your health care provider a list of all the medicines, herbs, non-prescription drugs, or dietary supplements you use. Also tell them if you smoke, drink alcohol, or use illegal drugs. Some items may interact with your medicine. What should I watch for while using this medication? Visit your doctor for checks on your progress. This drug may make you feel generally unwell. This is not uncommon, as chemotherapy can affect healthy cells as well as cancer cells. Report any side effects. Continue your course of treatment even though you feel ill unless your doctor tells you to stop. In some cases, you may be given additional medicines to help with side effects. Follow all directions for their  use. Call your doctor or health care professional for advice if you get a fever, chills or sore throat, or other symptoms of a cold or flu. Do not treat yourself. This drug decreases your body's ability to fight infections. Try to avoid being around people who are sick. This medicine may increase your risk to bruise or bleed. Call your doctor or health care professional if you notice any unusual bleeding. Be careful brushing and flossing your teeth or using a toothpick because you may get an infection or bleed more easily. If you have any dental work done, tell your dentist you are receiving this medicine. Avoid taking products that contain aspirin, acetaminophen, ibuprofen, naproxen, or ketoprofen unless instructed by your doctor. These medicines may hide a fever. Do not become pregnant while taking this medicine or for 6 months after stopping it. Women should inform their doctor if they wish to become pregnant or think they might be pregnant. Men should not father a child while taking this medicine and for 3 months after stopping it. There is a potential for serious side effects to an unborn child. Talk to your health care professional or pharmacist for more information. Do not breast-feed an infant while taking this medicine or for at least 1 week after stopping it. Men should inform their doctors if they wish to father a child. This medicine may lower sperm counts. Talk with  your doctor or health care professional if you are concerned about your fertility. What side effects may I notice from receiving this medication? Side effects that you should report to your doctor or health care professional as soon as possible: allergic reactions like skin rash, itching or hives, swelling of the face, lips, or tongue breathing problems pain, redness, or irritation at site where injected signs and symptoms of a dangerous change in heartbeat or heart rhythm like chest pain; dizziness; fast or irregular heartbeat;  palpitations; feeling faint or lightheaded, falls; breathing problems signs of decreased platelets or bleeding - bruising, pinpoint red spots on the skin, black, tarry stools, blood in the urine signs of decreased red blood cells - unusually weak or tired, feeling faint or lightheaded, falls signs of infection - fever or chills, cough, sore throat, pain or difficulty passing urine signs and symptoms of kidney injury like trouble passing urine or change in the amount of urine signs and symptoms of liver injury like dark yellow or brown urine; general ill feeling or flu-like symptoms; light-colored stools; loss of appetite; nausea; right upper belly pain; unusually weak or tired; yellowing of the eyes or skin swelling of ankles, feet, hands Side effects that usually do not require medical attention (report to your doctor or health care professional if they continue or are bothersome): constipation diarrhea hair loss loss of appetite nausea rash vomiting This list may not describe all possible side effects. Call your doctor for medical advice about side effects. You may report side effects to FDA at 1-800-FDA-1088. Where should I keep my medication? This drug is given in a hospital or clinic and will not be stored at home. NOTE: This sheet is a summary. It may not cover all possible information. If you have questions about this medicine, talk to your doctor, pharmacist, or health care provider.  2022 Elsevier/Gold Standard (2017-04-16 00:00:00)  Cisplatin injection What is this medication? CISPLATIN (SIS pla tin) is a chemotherapy drug. It targets fast dividing cells, like cancer cells, and causes these cells to die. This medicine is used to treat many types of cancer like bladder, ovarian, and testicular cancers. This medicine may be used for other purposes; ask your health care provider or pharmacist if you have questions. COMMON BRAND NAME(S): Platinol, Platinol -AQ What should I tell my  care team before I take this medication? They need to know if you have any of these conditions: eye disease, vision problems hearing problems kidney disease low blood counts, like white cells, platelets, or red blood cells tingling of the fingers or toes, or other nerve disorder an unusual or allergic reaction to cisplatin, carboplatin, oxaliplatin, other medicines, foods, dyes, or preservatives pregnant or trying to get pregnant breast-feeding How should I use this medication? This drug is given as an infusion into a vein. It is administered in a hospital or clinic by a specially trained health care professional. Talk to your pediatrician regarding the use of this medicine in children. Special care may be needed. Overdosage: If you think you have taken too much of this medicine contact a poison control center or emergency room at once. NOTE: This medicine is only for you. Do not share this medicine with others. What if I miss a dose? It is important not to miss a dose. Call your doctor or health care professional if you are unable to keep an appointment. What may interact with this medication? This medicine may interact with the following medications: foscarnet certain antibiotics like amikacin,  gentamicin, neomycin, polymyxin B, streptomycin, tobramycin, vancomycin This list may not describe all possible interactions. Give your health care provider a list of all the medicines, herbs, non-prescription drugs, or dietary supplements you use. Also tell them if you smoke, drink alcohol, or use illegal drugs. Some items may interact with your medicine. What should I watch for while using this medication? Your condition will be monitored carefully while you are receiving this medicine. You will need important blood work done while you are taking this medicine. This drug may make you feel generally unwell. This is not uncommon, as chemotherapy can affect healthy cells as well as cancer cells.  Report any side effects. Continue your course of treatment even though you feel ill unless your doctor tells you to stop. This medicine may increase your risk of getting an infection. Call your healthcare professional for advice if you get a fever, chills, or sore throat, or other symptoms of a cold or flu. Do not treat yourself. Try to avoid being around people who are sick. Avoid taking medicines that contain aspirin, acetaminophen, ibuprofen, naproxen, or ketoprofen unless instructed by your healthcare professional. These medicines may hide a fever. This medicine may increase your risk to bruise or bleed. Call your doctor or health care professional if you notice any unusual bleeding. Be careful brushing and flossing your teeth or using a toothpick because you may get an infection or bleed more easily. If you have any dental work done, tell your dentist you are receiving this medicine. Do not become pregnant while taking this medicine or for 14 months after stopping it. Women should inform their healthcare professional if they wish to become pregnant or think they might be pregnant. Men should not father a child while taking this medicine and for 11 months after stopping it. There is potential for serious side effects to an unborn child. Talk to your healthcare professional for more information. Do not breast-feed an infant while taking this medicine. This medicine has caused ovarian failure in some women. This medicine may make it more difficult to get pregnant. Talk to your healthcare professional if you are concerned about your fertility. This medicine has caused decreased sperm counts in some men. This may make it more difficult to father a child. Talk to your healthcare professional if you are concerned about your fertility. Drink fluids as directed while you are taking this medicine. This will help protect your kidneys. Call your doctor or health care professional if you get diarrhea. Do not treat  yourself. What side effects may I notice from receiving this medication? Side effects that you should report to your doctor or health care professional as soon as possible: allergic reactions like skin rash, itching or hives, swelling of the face, lips, or tongue blurred vision changes in vision decreased hearing or ringing of the ears nausea, vomiting pain, redness, or irritation at site where injected pain, tingling, numbness in the hands or feet signs and symptoms of bleeding such as bloody or black, tarry stools; red or dark brown urine; spitting up blood or brown material that looks like coffee grounds; red spots on the skin; unusual bruising or bleeding from the eyes, gums, or nose signs and symptoms of infection like fever; chills; cough; sore throat; pain or trouble passing urine signs and symptoms of kidney injury like trouble passing urine or change in the amount of urine signs and symptoms of low red blood cells or anemia such as unusually weak or tired; feeling faint or  lightheaded; falls; breathing problems Side effects that usually do not require medical attention (report to your doctor or health care professional if they continue or are bothersome): loss of appetite mouth sores muscle cramps This list may not describe all possible side effects. Call your doctor for medical advice about side effects. You may report side effects to FDA at 1-800-FDA-1088. Where should I keep my medication? This drug is given in a hospital or clinic and will not be stored at home. NOTE: This sheet is a summary. It may not cover all possible information. If you have questions about this medicine, talk to your doctor, pharmacist, or health care provider.  2022 Elsevier/Gold Standard (2020-10-10 00:00:00)

## 2021-01-15 NOTE — Progress Notes (Signed)
Patient presents for treatment. RN assessment completed along with the following:  Labs/vitals reviewed - Yes, and Platelet 98, okay to proceed with treatment per Dr. Benay Spice.    Weight within 10% of previous measurement - Yes Oncology Treatment Attestation completed for current therapy- Yes, on date 01/21/2020 Informed consent completed and reflects current therapy/intent - Yes, on date 02/04/2020             Provider progress note reviewed - Patient not seen by provider today. Most recent note dated 01/08/2021 reviewed. Treatment/Antibody/Supportive plan reviewed - Yes, and there are no adjustments needed for today's treatment. S&H and other orders reviewed - Yes, and there are no additional orders identified. Previous treatment date reviewed - Yes, and the appropriate amount of time has elapsed between treatments. Clinic Hand Off Received from - No  Patient to proceed with treatment.

## 2021-01-16 ENCOUNTER — Inpatient Hospital Stay: Payer: No Typology Code available for payment source

## 2021-01-16 VITALS — BP 146/87 | HR 71 | Temp 98.0°F | Resp 18

## 2021-01-16 DIAGNOSIS — Z5111 Encounter for antineoplastic chemotherapy: Secondary | ICD-10-CM | POA: Diagnosis not present

## 2021-01-16 DIAGNOSIS — C221 Intrahepatic bile duct carcinoma: Secondary | ICD-10-CM

## 2021-01-16 MED ORDER — PEGFILGRASTIM-JMDB 6 MG/0.6ML ~~LOC~~ SOSY
6.0000 mg | PREFILLED_SYRINGE | Freq: Once | SUBCUTANEOUS | Status: AC
  Administered 2021-01-16: 6 mg via SUBCUTANEOUS

## 2021-01-16 NOTE — Patient Instructions (Signed)

## 2021-01-18 ENCOUNTER — Encounter: Payer: Self-pay | Admitting: *Deleted

## 2021-01-18 ENCOUNTER — Other Ambulatory Visit (HOSPITAL_BASED_OUTPATIENT_CLINIC_OR_DEPARTMENT_OTHER): Payer: Self-pay

## 2021-01-18 NOTE — Progress Notes (Signed)
Faxed 12/12 and 12/13 treatment records  and current med list to [facility] w/date of next visit of 01/30/21. Fax [phone]

## 2021-01-25 ENCOUNTER — Other Ambulatory Visit (HOSPITAL_BASED_OUTPATIENT_CLINIC_OR_DEPARTMENT_OTHER): Payer: Self-pay

## 2021-01-25 ENCOUNTER — Encounter: Payer: No Typology Code available for payment source | Admitting: Family Medicine

## 2021-01-25 MED ORDER — AMOXICILLIN 500 MG PO CAPS
ORAL_CAPSULE | ORAL | 0 refills | Status: DC
  Filled 2021-01-25: qty 21, 7d supply, fill #0

## 2021-01-29 ENCOUNTER — Other Ambulatory Visit: Payer: Self-pay | Admitting: Oncology

## 2021-01-30 ENCOUNTER — Inpatient Hospital Stay (HOSPITAL_BASED_OUTPATIENT_CLINIC_OR_DEPARTMENT_OTHER): Payer: No Typology Code available for payment source | Admitting: Oncology

## 2021-01-30 ENCOUNTER — Inpatient Hospital Stay: Payer: No Typology Code available for payment source

## 2021-01-30 ENCOUNTER — Telehealth: Payer: Self-pay | Admitting: *Deleted

## 2021-01-30 ENCOUNTER — Encounter: Payer: Self-pay | Admitting: Oncology

## 2021-01-30 ENCOUNTER — Other Ambulatory Visit: Payer: Self-pay

## 2021-01-30 VITALS — BP 134/84 | HR 65 | Temp 97.8°F | Resp 18 | Ht 73.0 in | Wt 241.8 lb

## 2021-01-30 VITALS — BP 149/89 | HR 58 | Temp 98.0°F | Resp 20

## 2021-01-30 DIAGNOSIS — Z5111 Encounter for antineoplastic chemotherapy: Secondary | ICD-10-CM | POA: Diagnosis not present

## 2021-01-30 DIAGNOSIS — C221 Intrahepatic bile duct carcinoma: Secondary | ICD-10-CM

## 2021-01-30 DIAGNOSIS — E1165 Type 2 diabetes mellitus with hyperglycemia: Secondary | ICD-10-CM

## 2021-01-30 DIAGNOSIS — Z23 Encounter for immunization: Secondary | ICD-10-CM

## 2021-01-30 LAB — CBC WITH DIFFERENTIAL (CANCER CENTER ONLY)
Abs Immature Granulocytes: 0.04 10*3/uL (ref 0.00–0.07)
Basophils Absolute: 0.1 10*3/uL (ref 0.0–0.1)
Basophils Relative: 1 %
Eosinophils Absolute: 0.3 10*3/uL (ref 0.0–0.5)
Eosinophils Relative: 4 %
HCT: 32 % — ABNORMAL LOW (ref 39.0–52.0)
Hemoglobin: 10.6 g/dL — ABNORMAL LOW (ref 13.0–17.0)
Immature Granulocytes: 1 %
Lymphocytes Relative: 16 %
Lymphs Abs: 1.2 10*3/uL (ref 0.7–4.0)
MCH: 33.9 pg (ref 26.0–34.0)
MCHC: 33.1 g/dL (ref 30.0–36.0)
MCV: 102.2 fL — ABNORMAL HIGH (ref 80.0–100.0)
Monocytes Absolute: 0.9 10*3/uL (ref 0.1–1.0)
Monocytes Relative: 13 %
Neutro Abs: 4.6 10*3/uL (ref 1.7–7.7)
Neutrophils Relative %: 65 %
Platelet Count: 213 10*3/uL (ref 150–400)
RBC: 3.13 MIL/uL — ABNORMAL LOW (ref 4.22–5.81)
RDW: 14.2 % (ref 11.5–15.5)
WBC Count: 7.1 10*3/uL (ref 4.0–10.5)
nRBC: 0 % (ref 0.0–0.2)

## 2021-01-30 LAB — CMP (CANCER CENTER ONLY)
ALT: 22 U/L (ref 0–44)
AST: 45 U/L — ABNORMAL HIGH (ref 15–41)
Albumin: 3.6 g/dL (ref 3.5–5.0)
Alkaline Phosphatase: 159 U/L — ABNORMAL HIGH (ref 38–126)
Anion gap: 5 (ref 5–15)
BUN: 16 mg/dL (ref 6–20)
CO2: 30 mmol/L (ref 22–32)
Calcium: 9.5 mg/dL (ref 8.9–10.3)
Chloride: 100 mmol/L (ref 98–111)
Creatinine: 0.96 mg/dL (ref 0.61–1.24)
GFR, Estimated: >60 mL/min (ref >60)
Glucose, Bld: 171 mg/dL — ABNORMAL HIGH (ref 70–99)
Potassium: 4.1 mmol/L (ref 3.5–5.1)
Sodium: 135 mmol/L (ref 135–145)
Total Bilirubin: 0.4 mg/dL (ref 0.3–1.2)
Total Protein: 7.1 g/dL (ref 6.5–8.1)

## 2021-01-30 LAB — MAGNESIUM: Magnesium: 1.9 mg/dL (ref 1.7–2.4)

## 2021-01-30 MED ORDER — SODIUM CHLORIDE 0.9 % IV SOLN: INTRAVENOUS | Status: DC

## 2021-01-30 MED ORDER — SODIUM CHLORIDE 0.9 % IV SOLN
1500.0000 mg | Freq: Once | INTRAVENOUS | Status: AC
  Administered 2021-01-30: 11:00:00 1500 mg via INTRAVENOUS
  Filled 2021-01-30: qty 30

## 2021-01-30 MED ORDER — SODIUM CHLORIDE 0.9% FLUSH
10.0000 mL | INTRAVENOUS | Status: DC | PRN
  Administered 2021-01-30: 13:00:00 10 mL

## 2021-01-30 MED ORDER — SODIUM CHLORIDE 0.9 % IV SOLN
800.0000 mg/m2 | Freq: Once | INTRAVENOUS | Status: AC
  Administered 2021-01-30: 12:00:00 1862 mg via INTRAVENOUS
  Filled 2021-01-30: qty 48.97

## 2021-01-30 MED ORDER — FAMOTIDINE 20 MG IN NS 100 ML IVPB
20.0000 mg | Freq: Once | INTRAVENOUS | Status: AC
  Administered 2021-01-30: 10:00:00 20 mg via INTRAVENOUS
  Filled 2021-01-30: qty 100

## 2021-01-30 MED ORDER — HEPARIN SOD (PORK) LOCK FLUSH 100 UNIT/ML IV SOLN
500.0000 [IU] | Freq: Once | INTRAVENOUS | Status: AC | PRN
  Administered 2021-01-30: 13:00:00 500 [IU]

## 2021-01-30 MED ORDER — INFLUENZA VAC SPLIT QUAD 0.5 ML IM SUSY
0.5000 mL | PREFILLED_SYRINGE | Freq: Once | INTRAMUSCULAR | Status: AC
  Administered 2021-01-30: 13:00:00 0.5 mL via INTRAMUSCULAR
  Filled 2021-01-30: qty 0.5

## 2021-01-30 MED ORDER — PROCHLORPERAZINE MALEATE 10 MG PO TABS
10.0000 mg | ORAL_TABLET | Freq: Once | ORAL | Status: AC
  Administered 2021-01-30: 10:00:00 10 mg via ORAL
  Filled 2021-01-30: qty 1

## 2021-01-30 NOTE — Patient Instructions (Addendum)
[facility]   Discharge Instructions: Thank you for choosing [facility] to provide your oncology and hematology care.   If you have a lab appointment with the [facility], please go directly to the [facility] and check in at the registration area.   Wear comfortable clothing and clothing appropriate for easy access to any Portacath or PICC line.   We strive to give you quality time with your provider. You may need to reschedule your appointment if you arrive late (15 or more minutes).  Arriving late affects you and other patients whose appointments are after yours.  Also, if you miss three or more appointments without notifying the office, you may be dismissed from the clinic at the providers discretion.      For prescription refill requests, have your pharmacy contact our office and allow 72 hours for refills to be completed.    Today you received the following chemotherapy and/or immunotherapy agents Durvalumab (IMFINZI) & Gemcitabine (GEMZAR).      To help prevent nausea and vomiting after your treatment, we encourage you to take your nausea medication as directed.  BELOW ARE SYMPTOMS THAT SHOULD BE REPORTED IMMEDIATELY: *FEVER GREATER THAN 100.4 F (38 C) OR HIGHER *CHILLS OR SWEATING *NAUSEA AND VOMITING THAT IS NOT CONTROLLED WITH YOUR NAUSEA MEDICATION *UNUSUAL SHORTNESS OF BREATH *UNUSUAL BRUISING OR BLEEDING *URINARY PROBLEMS (pain or burning when urinating, or frequent urination) *BOWEL PROBLEMS (unusual diarrhea, constipation, pain near the anus) TENDERNESS IN MOUTH AND THROAT WITH OR WITHOUT PRESENCE OF ULCERS (sore throat, sores in mouth, or a toothache) UNUSUAL RASH, SWELLING OR PAIN  UNUSUAL VAGINAL DISCHARGE OR ITCHING   Items with * indicate a potential emergency and should be followed up as soon as possible or go to the Emergency Department if any problems should occur.  Please show the CHEMOTHERAPY ALERT CARD or  IMMUNOTHERAPY ALERT CARD at check-in to the Emergency Department and triage nurse.  Should you have questions after your visit or need to cancel or reschedule your appointment, please contact [facility]  Dept: [phone]  and follow the prompts.  Office hours are 8:00 a.m. to 4:30 p.m. Monday - Friday. Please note that voicemails left after 4:00 p.m. may not be returned until the following business day.  We are closed weekends and major holidays. You have access to a nurse at all times for urgent questions. Please call the main number to the clinic Dept: [phone] and follow the prompts.   For any non-urgent questions, you may also contact your provider using MyChart. We now offer e-Visits for anyone 55 and older to request care online for non-urgent symptoms. For details visit [email or web address].   Also download the MyChart app! Go to the app store, search "MyChart", open the app, select [facility], and log in with your MyChart username and password.  Due to Covid, a mask is required upon entering the hospital/clinic. If you do not have a mask, one will be given to you upon arrival. For doctor visits, patients may have 1 support person aged 20 or older with them. For treatment visits, patients cannot have anyone with them due to current Covid guidelines and our immunocompromised population.   Durvalumab injection What is this medication? DURVALUMAB (dur VAL ue mab) is a monoclonal antibody. It is used to treat lung cancer. This medicine may be used for other purposes; ask your health care provider or pharmacist if you have questions. COMMON BRAND NAME(S):  IMFINZI What should I tell my care team before I take this medication? They need to know if you have any of these conditions: autoimmune diseases like Crohn's disease, ulcerative colitis, or lupus have had or planning to have an allogeneic stem cell transplant (uses someone else's stem cells) history of  organ transplant history of radiation to the chest nervous system problems like myasthenia gravis or Guillain-Barre syndrome an unusual or allergic reaction to durvalumab, other medicines, foods, dyes, or preservatives pregnant or trying to get pregnant breast-feeding How should I use this medication? This medicine is for infusion into a vein. It is given by a health care professional in a hospital or clinic setting. A special MedGuide will be given to you before each treatment. Be sure to read this information carefully each time. Talk to your pediatrician regarding the use of this medicine in children. Special care may be needed. Overdosage: If you think you have taken too much of this medicine contact a poison control center or emergency room at once. NOTE: This medicine is only for you. Do not share this medicine with others. What if I miss a dose? It is important not to miss your dose. Call your doctor or health care professional if you are unable to keep an appointment. What may interact with this medication? Interactions have not been studied. This list may not describe all possible interactions. Give your health care provider a list of all the medicines, herbs, non-prescription drugs, or dietary supplements you use. Also tell them if you smoke, drink alcohol, or use illegal drugs. Some items may interact with your medicine. What should I watch for while using this medication? This medication may make you feel generally unwell. Continue your course of treatment even though you feel ill unless your care team tells you to stop. You may need blood work done while you are taking this medication. Do not become pregnant while taking this medication or for 3 months after stopping it. Women should inform their care team if they wish to become pregnant or think they might be pregnant. There is a potential for serious side effects to an unborn child. Talk to your care team or pharmacist for more  information. Do not breast-feed an infant while taking this medication or for 3 months after stopping it. What side effects may I notice from receiving this medication? Side effects that you should report to your care team as soon as possible: Allergic reactions--skin rash, itching, hives, swelling of the face, lips, tongue, or throat Bloody or watery diarrhea Dizziness, loss of balance or coordination, confusion or trouble speaking Dry cough, shortness of breath or trouble breathing Flushing, mostly over the face, neck, and chest, during injection High blood sugar (hyperglycemia)--increased thirst or amount of urine, unusual weakness or fatigue, blurry vision High thyroid levels (hyperthyroidism)--fast or irregular heartbeat, weight loss, excessive sweating or sensitivity to heat, tremors or shaking, anxiety, nervousness, irregular menstrual cycle or spotting Infection--fever, chills, cough, or sore throat Liver injury--right upper belly pain, loss of appetite, nausea, light-colored stool, dark yellow or brown urine, yellowing skin or eyes, unusual weakness or fatigue Low adrenal gland function--nausea, vomiting, loss of appetite, unusual weakness or fatigue, dizziness, low blood pressure Low thyroid levels (hypothyroidism)--unusual weakness or fatigue, increased sensitivity to cold, constipation, hair loss, dry skin, weight gain, feelings of depression Pancreatitis--severe stomach pain that spreads to your back or gets worse after eating or when touched, fever, nausea, vomiting Rash, fever, and swollen lymph nodes Redness, blistering, peeling or  loosening of the skin, including inside the mouth Wheezing--trouble breathing with loud or whistling sounds Side effects that usually do not require medical attention (report these to your care team if they continue or are bothersome): Fatigue Hair loss This list may not describe all possible side effects. Call your doctor for medical advice about side  effects. You may report side effects to FDA at 1-800-FDA-1088. Where should I keep my medication? This medication is given in a hospital or clinic. It will not be stored at home. NOTE: This sheet is a summary. It may not cover all possible information. If you have questions about this medicine, talk to your doctor, pharmacist, or health care provider.  2022 Elsevier/Gold Standard (2020-10-10 00:00:00)  Gemcitabine injection What is this medication? GEMCITABINE (jem SYE ta been) is a chemotherapy drug. This medicine is used to treat many types of cancer like breast cancer, lung cancer, pancreatic cancer, and ovarian cancer. This medicine may be used for other purposes; ask your health care provider or pharmacist if you have questions. COMMON BRAND NAME(S): Gemzar, Infugem What should I tell my care team before I take this medication? They need to know if you have any of these conditions: blood disorders infection kidney disease liver disease lung or breathing disease, like asthma recent or ongoing radiation therapy an unusual or allergic reaction to gemcitabine, other chemotherapy, other medicines, foods, dyes, or preservatives pregnant or trying to get pregnant breast-feeding How should I use this medication? This drug is given as an infusion into a vein. It is administered in a hospital or clinic by a specially trained health care professional. Talk to your pediatrician regarding the use of this medicine in children. Special care may be needed. Overdosage: If you think you have taken too much of this medicine contact a poison control center or emergency room at once. NOTE: This medicine is only for you. Do not share this medicine with others. What if I miss a dose? It is important not to miss your dose. Call your doctor or health care professional if you are unable to keep an appointment. What may interact with this medication? medicines to increase blood counts like filgrastim,  pegfilgrastim, sargramostim some other chemotherapy drugs like cisplatin vaccines Talk to your doctor or health care professional before taking any of these medicines: acetaminophen aspirin ibuprofen ketoprofen naproxen This list may not describe all possible interactions. Give your health care provider a list of all the medicines, herbs, non-prescription drugs, or dietary supplements you use. Also tell them if you smoke, drink alcohol, or use illegal drugs. Some items may interact with your medicine. What should I watch for while using this medication? Visit your doctor for checks on your progress. This drug may make you feel generally unwell. This is not uncommon, as chemotherapy can affect healthy cells as well as cancer cells. Report any side effects. Continue your course of treatment even though you feel ill unless your doctor tells you to stop. In some cases, you may be given additional medicines to help with side effects. Follow all directions for their use. Call your doctor or health care professional for advice if you get a fever, chills or sore throat, or other symptoms of a cold or flu. Do not treat yourself. This drug decreases your body's ability to fight infections. Try to avoid being around people who are sick. This medicine may increase your risk to bruise or bleed. Call your doctor or health care professional if you notice any unusual bleeding.  Be careful brushing and flossing your teeth or using a toothpick because you may get an infection or bleed more easily. If you have any dental work done, tell your dentist you are receiving this medicine. Avoid taking products that contain aspirin, acetaminophen, ibuprofen, naproxen, or ketoprofen unless instructed by your doctor. These medicines may hide a fever. Do not become pregnant while taking this medicine or for 6 months after stopping it. Women should inform their doctor if they wish to become pregnant or think they might be  pregnant. Men should not father a child while taking this medicine and for 3 months after stopping it. There is a potential for serious side effects to an unborn child. Talk to your health care professional or pharmacist for more information. Do not breast-feed an infant while taking this medicine or for at least 1 week after stopping it. Men should inform their doctors if they wish to father a child. This medicine may lower sperm counts. Talk with your doctor or health care professional if you are concerned about your fertility. What side effects may I notice from receiving this medication? Side effects that you should report to your doctor or health care professional as soon as possible: allergic reactions like skin rash, itching or hives, swelling of the face, lips, or tongue breathing problems pain, redness, or irritation at site where injected signs and symptoms of a dangerous change in heartbeat or heart rhythm like chest pain; dizziness; fast or irregular heartbeat; palpitations; feeling faint or lightheaded, falls; breathing problems signs of decreased platelets or bleeding - bruising, pinpoint red spots on the skin, black, tarry stools, blood in the urine signs of decreased red blood cells - unusually weak or tired, feeling faint or lightheaded, falls signs of infection - fever or chills, cough, sore throat, pain or difficulty passing urine signs and symptoms of kidney injury like trouble passing urine or change in the amount of urine signs and symptoms of liver injury like dark yellow or brown urine; general ill feeling or flu-like symptoms; light-colored stools; loss of appetite; nausea; right upper belly pain; unusually weak or tired; yellowing of the eyes or skin swelling of ankles, feet, hands Side effects that usually do not require medical attention (report to your doctor or health care professional if they continue or are bothersome): constipation diarrhea hair loss loss of  appetite nausea rash vomiting This list may not describe all possible side effects. Call your doctor for medical advice about side effects. You may report side effects to FDA at 1-800-FDA-1088. Where should I keep my medication? This drug is given in a hospital or clinic and will not be stored at home. NOTE: This sheet is a summary. It may not cover all possible information. If you have questions about this medicine, talk to your doctor, pharmacist, or health care provider.  2022 Elsevier/Gold Standard (2017-04-16 00:00:00)  Influenza Virus Vaccine injection What is this medication? INFLUENZA VIRUS VACCINE (in floo EN zuh VAHY ruhs vak SEEN) helps to reduce the risk of getting influenza also known as the flu. The vaccine only helps protect you against some strains of the flu. This medicine may be used for other purposes; ask your health care provider or pharmacist if you have questions. COMMON BRAND NAME(S): Afluria, Afluria Quadrivalent, Agriflu, Alfuria, FLUAD, FLUAD Quadrivalent, Fluarix, Fluarix Quadrivalent, Flublok, Flublok Quadrivalent, FLUCELVAX, FLUCELVAX Quadrivalent, Flulaval, Flulaval Quadrivalent, Fluvirin, Fluzone, Fluzone High-Dose, Fluzone Intradermal, Fluzone Quadrivalent What should I tell my care team before I take this medication? They  need to know if you have any of these conditions: bleeding disorder like hemophilia fever or infection Guillain-Barre syndrome or other neurological problems immune system problems infection with the human immunodeficiency virus (HIV) or AIDS low blood platelet counts multiple sclerosis an unusual or allergic reaction to influenza virus vaccine, latex, other medicines, foods, dyes, or preservatives. Different brands of vaccines contain different allergens. Some may contain latex or eggs. Talk to your doctor about your allergies to make sure that you get the right vaccine. pregnant or trying to get pregnant breast-feeding How should I use  this medication? This vaccine is for injection into a muscle or under the skin. It is given by a health care professional. A copy of Vaccine Information Statements will be given before each vaccination. Read this sheet carefully each time. The sheet may change frequently. Talk to your healthcare provider to see which vaccines are right for you. Some vaccines should not be used in all age groups. Overdosage: If you think you have taken too much of this medicine contact a poison control center or emergency room at once. NOTE: This medicine is only for you. Do not share this medicine with others. What if I miss a dose? This does not apply. What may interact with this medication? chemotherapy or radiation therapy medicines that lower your immune system like etanercept, anakinra, infliximab, and adalimumab medicines that treat or prevent blood clots like warfarin phenytoin steroid medicines like prednisone or cortisone theophylline vaccines This list may not describe all possible interactions. Give your health care provider a list of all the medicines, herbs, non-prescription drugs, or dietary supplements you use. Also tell them if you smoke, drink alcohol, or use illegal drugs. Some items may interact with your medicine. What should I watch for while using this medication? Report any side effects that do not go away within 3 days to your doctor or health care professional. Call your health care provider if any unusual symptoms occur within 6 weeks of receiving this vaccine. You may still catch the flu, but the illness is not usually as bad. You cannot get the flu from the vaccine. The vaccine will not protect against colds or other illnesses that may cause fever. The vaccine is needed every year. What side effects may I notice from receiving this medication? Side effects that you should report to your doctor or health care professional as soon as possible: allergic reactions like skin rash, itching  or hives, swelling of the face, lips, or tongue Side effects that usually do not require medical attention (report to your doctor or health care professional if they continue or are bothersome): fever headache muscle aches and pains pain, tenderness, redness, or swelling at the injection site tiredness This list may not describe all possible side effects. Call your doctor for medical advice about side effects. You may report side effects to FDA at 1-800-FDA-1088. Where should I keep my medication? The vaccine will be given by a health care professional in a clinic, pharmacy, doctor's office, or other health care setting. You will not be given vaccine doses to store at home. NOTE: This sheet is a summary. It may not cover all possible information. If you have questions about this medicine, talk to your doctor, pharmacist, or health care provider.  2022 Elsevier/Gold Standard (2020-10-10 00:00:00)

## 2021-01-30 NOTE — Telephone Encounter (Signed)
Notified wife, [name] of CT scan on 1/12 at 0845/0900 at [facility]. NPO 4 hours prior and oral contrast at 0700 and 0800

## 2021-01-30 NOTE — Progress Notes (Signed)
Patient presents for treatment. RN assessment completed along with the following:  Labs/vitals reviewed - Yes, and within treatment parameters.   Weight within 10% of previous measurement - Yes Oncology Treatment Attestation completed for current therapy- Yes, on date 02/01/2020 Informed consent completed and reflects current therapy/intent - Yes, on date 02/04/2020 Gemzar; 06/12/2020 Imfinzi             Provider progress note reviewed - Today's provider note is not yet available. I reviewed the most recent oncology provider progress note in chart dated 01/08/2021. Treatment/Antibody/Supportive plan reviewed - Yes, and Hold Cisplatin today due to increased Neuropathy S&H and other orders reviewed - Yes, and there are no additional orders identified. Previous treatment date reviewed - Yes, and the appropriate amount of time has elapsed between treatments. Clinic Hand Off Received from - Yes from Haley-B, RN  Patient to proceed with treatment.

## 2021-01-30 NOTE — Progress Notes (Signed)
[facility] OFFICE PROGRESS NOTE   Diagnosis: Cholangiocarcinoma  INTERVAL HISTORY:   Matthew Osborne completed another cycle of gemcitabine/cisplatin/Durvalumab beginning 01/08/2021.  No nausea, fever, rash, or diarrhea.  He reports increased numbness in the extremities.  This now interferes with fine motor activity.  He had difficulty dealing cards.  Objective:  Vital signs in last 24 hours:  Blood pressure 134/84, pulse 65, temperature 97.8 F (36.6 C), temperature source Oral, resp. rate 18, height 6' 1"  (1.854 m), weight 241 lb 12.8 oz (109.7 kg), SpO2 98 %.    HEENT: No thrush or ulcers Resp: Lungs clear bilaterally Cardio: Regular rate and rhythm GI: No hepatomegaly, nontender Vascular: No leg edema    Portacath/PICC-without erythema  Lab Results:  Lab Results  Component Value Date   WBC 7.1 01/30/2021   HGB 10.6 (L) 01/30/2021   HCT 32.0 (L) 01/30/2021   MCV 102.2 (H) 01/30/2021   PLT 213 01/30/2021   NEUTROABS 4.6 01/30/2021    CMP  Lab Results  Component Value Date   NA 135 01/30/2021   K 4.1 01/30/2021   CL 100 01/30/2021   CO2 30 01/30/2021   GLUCOSE 171 (H) 01/30/2021   BUN 16 01/30/2021   CREATININE 0.96 01/30/2021   CALCIUM 9.5 01/30/2021   PROT 7.1 01/30/2021   ALBUMIN 3.6 01/30/2021   AST 45 (H) 01/30/2021   ALT 22 01/30/2021   ALKPHOS 159 (H) 01/30/2021   BILITOT 0.4 01/30/2021   GFRNONAA >60 01/30/2021   GFRAA 127 01/11/2020    Lab Results  Component Value Date   CEA1 2.15 01/14/2020   CAN199 82 (H) 08/21/2020     Medications: I have reviewed the patient's current medications.   Assessment/Plan: Cholangiocarcinoma  multiple liver masses and abdominal lymphadenopathy CTs 01/13/2020-rounded hypodense mass appears to arise from the pancreas neck, multiple rim-enhancing masses in the liver, primarily left liver with segmental dilation of the left lobe Intermatic bile ducts, ill-defined hypodensity the central liver with  effacement of the left portal vein, enlarged portacaval and retroperitoneal lymph nodes Ultrasound-guided biopsy of the left liver lesion 01/19/2020-adenocarcinoma, cytokeratin 7+, MSS, tumor mutation burden 1, IDH1 R132C MRI abdomen 01/31/2020-poorly marginated central liver mass, multiple smaller similar satellite liver masses, extrinsic mass-effect at the biliary hilum with intrahepatic biliary ductal dilatation throughout the left liver with mild Intermatic dilatation the superior right liver, no pancreas mass or ductal dilatation, normal spleen size, numerous enlarged enhancing lymph nodes at the porta hepatis, peripancreatic, portacaval, aortocaval, left periaortic chains Cycle 1 gemcitabine/cisplatin 02/04/2020 Cycle 2 gemcitabine/cisplatin 02/28/2020 Cycle 3 gemcitabine/cisplatin 03/20/2020 CT abdomen/pelvis 03/31/2020-mild decrease in central liver tumor, mild decrease in size of liver metastases and upper abdominal adenopathy, new splenomegaly Cycle 4 gemcitabine/cisplatin 04/10/2020 Cycle 5 gemcitabine/cisplatin 05/01/2020 Cycle 6 gemcitabine/cisplatin plus durvalumab 05/22/2020 CTs 06/06/2020- dominant central liver mass mildly enlarged, other liver lesions and abdominal adenopathy is stable, no evidence of metastatic disease to the chest Cycle 7 gemcitabine/cisplatin plus Durvalumab 06/12/2020 Cycle 8 gemcitabine/cisplatin plus Durvalumab 06/30/2020 Cycle 9 gemcitabine/cisplatin plus Durvalumab 07/31/2020 CT abdomen/pelvis 08/20/2020-decreased size of dominant central liver mass, slight increase in size and stable additional liver lesions, stable portacaval node Cycle 10 gemcitabine/cisplatin plus Durvalumab 08/21/2020 Cycle 11 gemcitabine/cisplatin plus Durvalumab 09/11/2020 Cycle 12 gemcitabine/cisplatin plus Durvalumab 10/02/2020 Cycle 13 gemcitabine/cisplatin plus Durvalumab 10/23/2020 CT abdomen/pelvis 11/10/2020-no change in dominant central liver mass and additional liver lesions, stable upper  retroperitoneal adenopathy Cycle 14 gemcitabine/cisplatin plus Durvalumab 11/13/2020 Cycle 15 gemcitabine/cisplatin plus Durvalumab 12/04/2020 Cycle 15 gemcitabine/cisplatin plus Durvalumab  01/08/2021 Cycle 16 gemcitabine/cisplatin plus Durvalumab 01/30/2021-cisplatin held from day 1 and day 8 secondary to neuropathy   Cough-likely related to diaphragmatic irritation from #1 Anorexia/weight loss Hypercalcemia-likely hypercalcemia malignancy, status post intravenous hydration and Zometa 01/14/2020 Diabetes Hyperlipidemia Hypertension History of peripheral neuropathy secondary to diabetes Severe back pain following Fulphila-oxycodone prescribed Thrombocytopenia secondary to chemotherapy-gemcitabine held with day 1 cycle 8 and then dose reduced COVID-19 infection 07/22/2020, 12/25/2020 Cisplatin induced peripheral neuropathy    Disposition: Matthew Osborne appears stable.  He will begin another cycle of systemic therapy today.  Cisplatin will be held secondary to neuropathy symptoms. Matthew Osborne will return for an office visit and chemotherapy 02/17/2020.  He will undergo a restaging CT on 02/16/2020.  I discontinue cisplatin and intravenous hydration from this cycle of chemotherapy.  I encouraged Matthew Osborne to obtain an influenza vaccine.  Betsy Coder, MD  01/30/2021  1:56 PM

## 2021-02-01 ENCOUNTER — Telehealth: Payer: Self-pay | Admitting: Oncology

## 2021-02-01 ENCOUNTER — Other Ambulatory Visit (HOSPITAL_BASED_OUTPATIENT_CLINIC_OR_DEPARTMENT_OTHER): Payer: Self-pay

## 2021-02-01 MED ORDER — AMOXICILLIN 500 MG PO CAPS
500.0000 mg | ORAL_CAPSULE | Freq: Three times a day (TID) | ORAL | 0 refills | Status: DC
  Filled 2021-02-01: qty 21, 7d supply, fill #0

## 2021-02-01 NOTE — Telephone Encounter (Signed)
Attempted to contact patient to advise about scheduled appts per los 12/27. Left voicemail regarding appts on 1/13.

## 2021-02-05 ENCOUNTER — Other Ambulatory Visit: Payer: Self-pay | Admitting: Oncology

## 2021-02-06 ENCOUNTER — Other Ambulatory Visit: Payer: Self-pay

## 2021-02-06 ENCOUNTER — Inpatient Hospital Stay: Payer: No Typology Code available for payment source | Attending: Oncology

## 2021-02-06 ENCOUNTER — Inpatient Hospital Stay: Payer: No Typology Code available for payment source

## 2021-02-06 ENCOUNTER — Encounter: Payer: Self-pay | Admitting: *Deleted

## 2021-02-06 VITALS — BP 133/63 | HR 62 | Temp 98.0°F | Resp 18 | Ht 73.0 in | Wt 240.5 lb

## 2021-02-06 DIAGNOSIS — C221 Intrahepatic bile duct carcinoma: Secondary | ICD-10-CM | POA: Diagnosis present

## 2021-02-06 DIAGNOSIS — Z5189 Encounter for other specified aftercare: Secondary | ICD-10-CM | POA: Insufficient documentation

## 2021-02-06 DIAGNOSIS — E1142 Type 2 diabetes mellitus with diabetic polyneuropathy: Secondary | ICD-10-CM | POA: Insufficient documentation

## 2021-02-06 DIAGNOSIS — E785 Hyperlipidemia, unspecified: Secondary | ICD-10-CM | POA: Diagnosis not present

## 2021-02-06 DIAGNOSIS — Z5111 Encounter for antineoplastic chemotherapy: Secondary | ICD-10-CM | POA: Insufficient documentation

## 2021-02-06 DIAGNOSIS — C249 Malignant neoplasm of biliary tract, unspecified: Secondary | ICD-10-CM

## 2021-02-06 DIAGNOSIS — R162 Hepatomegaly with splenomegaly, not elsewhere classified: Secondary | ICD-10-CM | POA: Diagnosis not present

## 2021-02-06 DIAGNOSIS — D6959 Other secondary thrombocytopenia: Secondary | ICD-10-CM | POA: Diagnosis not present

## 2021-02-06 DIAGNOSIS — R059 Cough, unspecified: Secondary | ICD-10-CM | POA: Diagnosis not present

## 2021-02-06 DIAGNOSIS — I10 Essential (primary) hypertension: Secondary | ICD-10-CM | POA: Insufficient documentation

## 2021-02-06 DIAGNOSIS — R630 Anorexia: Secondary | ICD-10-CM | POA: Diagnosis not present

## 2021-02-06 LAB — CMP (CANCER CENTER ONLY)
ALT: 45 U/L — ABNORMAL HIGH (ref 0–44)
AST: 80 U/L — ABNORMAL HIGH (ref 15–41)
Albumin: 3.5 g/dL (ref 3.5–5.0)
Alkaline Phosphatase: 147 U/L — ABNORMAL HIGH (ref 38–126)
Anion gap: 7 (ref 5–15)
BUN: 17 mg/dL (ref 6–20)
CO2: 29 mmol/L (ref 22–32)
Calcium: 9.2 mg/dL (ref 8.9–10.3)
Chloride: 101 mmol/L (ref 98–111)
Creatinine: 0.99 mg/dL (ref 0.61–1.24)
GFR, Estimated: >60 mL/min (ref >60)
Glucose, Bld: 154 mg/dL — ABNORMAL HIGH (ref 70–99)
Potassium: 4.1 mmol/L (ref 3.5–5.1)
Sodium: 137 mmol/L (ref 135–145)
Total Bilirubin: 0.5 mg/dL (ref 0.3–1.2)
Total Protein: 7.3 g/dL (ref 6.5–8.1)

## 2021-02-06 LAB — CBC WITH DIFFERENTIAL (CANCER CENTER ONLY)
Abs Immature Granulocytes: 0 10*3/uL (ref 0.00–0.07)
Basophils Absolute: 0 10*3/uL (ref 0.0–0.1)
Basophils Relative: 1 %
Eosinophils Absolute: 0.1 10*3/uL (ref 0.0–0.5)
Eosinophils Relative: 2 %
HCT: 30.5 % — ABNORMAL LOW (ref 39.0–52.0)
Hemoglobin: 10.1 g/dL — ABNORMAL LOW (ref 13.0–17.0)
Immature Granulocytes: 0 %
Lymphocytes Relative: 34 %
Lymphs Abs: 1 10*3/uL (ref 0.7–4.0)
MCH: 34 pg (ref 26.0–34.0)
MCHC: 33.1 g/dL (ref 30.0–36.0)
MCV: 102.7 fL — ABNORMAL HIGH (ref 80.0–100.0)
Monocytes Absolute: 0.7 10*3/uL (ref 0.1–1.0)
Monocytes Relative: 25 %
Neutro Abs: 1.1 10*3/uL — ABNORMAL LOW (ref 1.7–7.7)
Neutrophils Relative %: 38 %
Platelet Count: 123 10*3/uL — ABNORMAL LOW (ref 150–400)
RBC: 2.97 MIL/uL — ABNORMAL LOW (ref 4.22–5.81)
RDW: 14.2 % (ref 11.5–15.5)
WBC Count: 2.9 10*3/uL — ABNORMAL LOW (ref 4.0–10.5)
nRBC: 0 % (ref 0.0–0.2)

## 2021-02-06 LAB — MAGNESIUM: Magnesium: 1.9 mg/dL (ref 1.7–2.4)

## 2021-02-06 MED ORDER — SODIUM CHLORIDE 0.9 % IV SOLN
800.0000 mg/m2 | Freq: Once | INTRAVENOUS | Status: AC
  Administered 2021-02-06: 1862 mg via INTRAVENOUS
  Filled 2021-02-06: qty 48.97

## 2021-02-06 MED ORDER — HEPARIN SOD (PORK) LOCK FLUSH 100 UNIT/ML IV SOLN
500.0000 [IU] | Freq: Once | INTRAVENOUS | Status: AC | PRN
  Administered 2021-02-06: 500 [IU]

## 2021-02-06 MED ORDER — SODIUM CHLORIDE 0.9 % IV SOLN: INTRAVENOUS | Status: DC

## 2021-02-06 MED ORDER — PROCHLORPERAZINE MALEATE 10 MG PO TABS
10.0000 mg | ORAL_TABLET | Freq: Once | ORAL | Status: AC
  Administered 2021-02-06: 10 mg via ORAL
  Filled 2021-02-06: qty 1

## 2021-02-06 MED ORDER — SODIUM CHLORIDE 0.9% FLUSH
10.0000 mL | INTRAVENOUS | Status: DC | PRN
  Administered 2021-02-06: 10 mL

## 2021-02-06 MED ORDER — FAMOTIDINE 20 MG IN NS 100 ML IVPB
20.0000 mg | Freq: Once | INTRAVENOUS | Status: AC
  Administered 2021-02-06: 20 mg via INTRAVENOUS
  Filled 2021-02-06: qty 100

## 2021-02-06 NOTE — Patient Instructions (Signed)
Matthew Osborne   Discharge Instructions: Thank you for choosing [facility] to provide your oncology and hematology care.   If you have a lab appointment with the [facility], please go directly to the [facility] and check in at the registration area.   Wear comfortable clothing and clothing appropriate for easy access to any Portacath or PICC line.   We strive to give you quality time with your provider. You may need to reschedule your appointment if you arrive late (15 or more minutes).  Arriving late affects you and other patients whose appointments are after yours.  Also, if you miss three or more appointments without notifying the office, you may be dismissed from the clinic at the providers discretion.      For prescription refill requests, have your pharmacy contact our office and allow 72 hours for refills to be completed.    Today you received the following chemotherapy and/or immunotherapy agents Gemcitabine (GEMZAR).      To help prevent nausea and vomiting after your treatment, we encourage you to take your nausea medication as directed.  BELOW ARE SYMPTOMS THAT SHOULD BE REPORTED IMMEDIATELY: *FEVER GREATER THAN 100.4 F (38 C) OR HIGHER *CHILLS OR SWEATING *NAUSEA AND VOMITING THAT IS NOT CONTROLLED WITH YOUR NAUSEA MEDICATION *UNUSUAL SHORTNESS OF BREATH *UNUSUAL BRUISING OR BLEEDING *URINARY PROBLEMS (pain or burning when urinating, or frequent urination) *BOWEL PROBLEMS (unusual diarrhea, constipation, pain near the anus) TENDERNESS IN MOUTH AND THROAT WITH OR WITHOUT PRESENCE OF ULCERS (sore throat, sores in mouth, or a toothache) UNUSUAL RASH, SWELLING OR PAIN  UNUSUAL VAGINAL DISCHARGE OR ITCHING   Items with * indicate a potential emergency and should be followed up as soon as possible or go to the Emergency Department if any problems should occur.  Please show the CHEMOTHERAPY ALERT CARD or IMMUNOTHERAPY ALERT CARD at  check-in to the Emergency Department and triage nurse.  Should you have questions after your visit or need to cancel or reschedule your appointment, please contact [facility]  Dept: [phone]  and follow the prompts.  Office hours are 8:00 a.m. to 4:30 p.m. Monday - Friday. Please note that voicemails left after 4:00 p.m. may not be returned until the following business day.  We are closed weekends and major holidays. You have access to a nurse at all times for urgent questions. Please call the main number to the clinic Dept: [phone] and follow the prompts.   For any non-urgent questions, you may also contact your provider using MyChart. We now offer e-Visits for anyone 53 and older to request care online for non-urgent symptoms. For details visit [email or web address].   Also download the MyChart app! Go to the app store, search "MyChart", open the app, select [facility], and log in with your MyChart username and password.  Due to Covid, a mask is required upon entering the hospital/clinic. If you do not have a mask, one will be given to you upon arrival. For doctor visits, patients may have 1 support person aged 23 or older with them. For treatment visits, patients cannot have anyone with them due to current Covid guidelines and our immunocompromised population.   Gemcitabine injection What is this medication? GEMCITABINE (jem SYE ta been) is a chemotherapy drug. This medicine is used to treat many types of cancer like breast cancer, lung cancer, pancreatic cancer, and ovarian cancer. This medicine may be used for other purposes; ask your health care  provider or pharmacist if you have questions. COMMON BRAND NAME(S): Gemzar, Infugem What should I tell my care team before I take this medication? They need to know if you have any of these conditions: blood disorders infection kidney disease liver disease lung or breathing disease, like asthma recent or  ongoing radiation therapy an unusual or allergic reaction to gemcitabine, other chemotherapy, other medicines, foods, dyes, or preservatives pregnant or trying to get pregnant breast-feeding How should I use this medication? This drug is given as an infusion into a vein. It is administered in a hospital or clinic by a specially trained health care professional. Talk to your pediatrician regarding the use of this medicine in children. Special care may be needed. Overdosage: If you think you have taken too much of this medicine contact a poison control center or emergency room at once. NOTE: This medicine is only for you. Do not share this medicine with others. What if I miss a dose? It is important not to miss your dose. Call your doctor or health care professional if you are unable to keep an appointment. What may interact with this medication? medicines to increase blood counts like filgrastim, pegfilgrastim, sargramostim some other chemotherapy drugs like cisplatin vaccines Talk to your doctor or health care professional before taking any of these medicines: acetaminophen aspirin ibuprofen ketoprofen naproxen This list may not describe all possible interactions. Give your health care provider a list of all the medicines, herbs, non-prescription drugs, or dietary supplements you use. Also tell them if you smoke, drink alcohol, or use illegal drugs. Some items may interact with your medicine. What should I watch for while using this medication? Visit your doctor for checks on your progress. This drug may make you feel generally unwell. This is not uncommon, as chemotherapy can affect healthy cells as well as cancer cells. Report any side effects. Continue your course of treatment even though you feel ill unless your doctor tells you to stop. In some cases, you may be given additional medicines to help with side effects. Follow all directions for their use. Call your doctor or health care  professional for advice if you get a fever, chills or sore throat, or other symptoms of a cold or flu. Do not treat yourself. This drug decreases your body's ability to fight infections. Try to avoid being around people who are sick. This medicine may increase your risk to bruise or bleed. Call your doctor or health care professional if you notice any unusual bleeding. Be careful brushing and flossing your teeth or using a toothpick because you may get an infection or bleed more easily. If you have any dental work done, tell your dentist you are receiving this medicine. Avoid taking products that contain aspirin, acetaminophen, ibuprofen, naproxen, or ketoprofen unless instructed by your doctor. These medicines may hide a fever. Do not become pregnant while taking this medicine or for 6 months after stopping it. Women should inform their doctor if they wish to become pregnant or think they might be pregnant. Men should not father a child while taking this medicine and for 3 months after stopping it. There is a potential for serious side effects to an unborn child. Talk to your health care professional or pharmacist for more information. Do not breast-feed an infant while taking this medicine or for at least 1 week after stopping it. Men should inform their doctors if they wish to father a child. This medicine may lower sperm counts. Talk with your doctor or  health care professional if you are concerned about your fertility. What side effects may I notice from receiving this medication? Side effects that you should report to your doctor or health care professional as soon as possible: allergic reactions like skin rash, itching or hives, swelling of the face, lips, or tongue breathing problems pain, redness, or irritation at site where injected signs and symptoms of a dangerous change in heartbeat or heart rhythm like chest pain; dizziness; fast or irregular heartbeat; palpitations; feeling faint or  lightheaded, falls; breathing problems signs of decreased platelets or bleeding - bruising, pinpoint red spots on the skin, black, tarry stools, blood in the urine signs of decreased red blood cells - unusually weak or tired, feeling faint or lightheaded, falls signs of infection - fever or chills, cough, sore throat, pain or difficulty passing urine signs and symptoms of kidney injury like trouble passing urine or change in the amount of urine signs and symptoms of liver injury like dark yellow or brown urine; general ill feeling or flu-like symptoms; light-colored stools; loss of appetite; nausea; right upper belly pain; unusually weak or tired; yellowing of the eyes or skin swelling of ankles, feet, hands Side effects that usually do not require medical attention (report to your doctor or health care professional if they continue or are bothersome): constipation diarrhea hair loss loss of appetite nausea rash vomiting This list may not describe all possible side effects. Call your doctor for medical advice about side effects. You may report side effects to FDA at 1-800-FDA-1088. Where should I keep my medication? This drug is given in a hospital or clinic and will not be stored at home. NOTE: This sheet is a summary. It may not cover all possible information. If you have questions about this medicine, talk to your doctor, pharmacist, or health care provider.  2022 Elsevier/Gold Standard (2017-04-16 00:00:00)

## 2021-02-06 NOTE — Progress Notes (Signed)
Faxed 01/30/21 office note, labs to [facility] att: Wilford Sports, LPN #[phone]. Next treatment today without MD visit and next appointment noted on 02/16/21.

## 2021-02-06 NOTE — Progress Notes (Signed)
Patient presents for treatment. RN assessment completed along with the following:  Labs/vitals reviewed - Yes, and ANC 1.1, okay to proceed with treatment per Dr. Benay Spice; patient is getting Fulphila tomorrow 02/07/21    Weight within 10% of previous measurement - Yes Oncology Treatment Attestation completed for current therapy- Yes, on date 02/01/2020 Informed consent completed and reflects current therapy/intent - Yes, on date 02/04/2020             Provider progress note reviewed - Patient not seen by provider today. Most recent note dated 01/30/2021 reviewed. Treatment/Antibody/Supportive plan reviewed - Yes, and there are no adjustments needed for today's treatment. S&H and other orders reviewed - Yes, and there are no additional orders identified. Previous treatment date reviewed - Yes, and the appropriate amount of time has elapsed between treatments. Clinic Hand Off Received from - No.  Patient to proceed with treatment.

## 2021-02-07 ENCOUNTER — Inpatient Hospital Stay: Payer: No Typology Code available for payment source

## 2021-02-07 VITALS — BP 134/84 | Temp 98.2°F | Resp 18

## 2021-02-07 DIAGNOSIS — Z5111 Encounter for antineoplastic chemotherapy: Secondary | ICD-10-CM | POA: Diagnosis not present

## 2021-02-07 DIAGNOSIS — C221 Intrahepatic bile duct carcinoma: Secondary | ICD-10-CM

## 2021-02-07 MED ORDER — PEGFILGRASTIM-JMDB 6 MG/0.6ML ~~LOC~~ SOSY
6.0000 mg | PREFILLED_SYRINGE | Freq: Once | SUBCUTANEOUS | Status: AC
  Administered 2021-02-07: 6 mg via SUBCUTANEOUS
  Filled 2021-02-07: qty 0.6

## 2021-02-07 NOTE — Patient Instructions (Signed)
[facility]  Discharge Instructions: Thank you for choosing [facility] to provide your oncology and hematology care.   If you have a lab appointment with the [facility], please go directly to the [facility] and check in at the registration area.   Wear comfortable clothing and clothing appropriate for easy access to any Portacath or PICC line.   We strive to give you quality time with your provider. You may need to reschedule your appointment if you arrive late (15 or more minutes).  Arriving late affects you and other patients whose appointments are after yours.  Also, if you miss three or more appointments without notifying the office, you may be dismissed from the clinic at the providers discretion.      For prescription refill requests, have your pharmacy contact our office and allow 72 hours for refills to be completed.       To help prevent nausea and vomiting after your treatment, we encourage you to take your nausea medication as directed.  BELOW ARE SYMPTOMS THAT SHOULD BE REPORTED IMMEDIATELY: *FEVER GREATER THAN 100.4 F (38 C) OR HIGHER *CHILLS OR SWEATING *NAUSEA AND VOMITING THAT IS NOT CONTROLLED WITH YOUR NAUSEA MEDICATION *UNUSUAL SHORTNESS OF BREATH *UNUSUAL BRUISING OR BLEEDING *URINARY PROBLEMS (pain or burning when urinating, or frequent urination) *BOWEL PROBLEMS (unusual diarrhea, constipation, pain near the anus) TENDERNESS IN MOUTH AND THROAT WITH OR WITHOUT PRESENCE OF ULCERS (sore throat, sores in mouth, or a toothache) UNUSUAL RASH, SWELLING OR PAIN  UNUSUAL VAGINAL DISCHARGE OR ITCHING   Items with * indicate a potential emergency and should be followed up as soon as possible or go to the Emergency Department if any problems should occur.  Please show the CHEMOTHERAPY ALERT CARD or IMMUNOTHERAPY ALERT CARD at check-in to the Emergency Department and triage nurse.  Should you have questions after your visit  or need to cancel or reschedule your appointment, please contact [facility]  Dept: [phone]  and follow the prompts.  Office hours are 8:00 a.m. to 4:30 p.m. Monday - Friday. Please note that voicemails left after 4:00 p.m. may not be returned until the following business day.  We are closed weekends and major holidays. You have access to a nurse at all times for urgent questions. Please call the main number to the clinic Dept: [phone] and follow the prompts.   For any non-urgent questions, you may also contact your provider using MyChart. We now offer e-Visits for anyone 10 and older to request care online for non-urgent symptoms. For details visit [email or web address].   Also download the MyChart app! Go to the app store, search "MyChart", open the app, select [facility], and log in with your MyChart username and password.  Due to Covid, a mask is required upon entering the hospital/clinic. If you do not have a mask, one will be given to you upon arrival. For doctor visits, patients may have 1 support person aged 70 or older with them. For treatment visits, patients cannot have anyone with them due to current Covid guidelines and our immunocompromised population.

## 2021-02-07 NOTE — Progress Notes (Signed)
Patient tolerated injection with no complaints voiced.  Site clean and dry with no bruising or swelling noted at site.  See MAR for details.  Band aid applied.  Patient stable during and after injection.  Vss with discharge and left in satisfactory condition with no s/s of distress noted.  

## 2021-02-11 ENCOUNTER — Other Ambulatory Visit: Payer: Self-pay | Admitting: Oncology

## 2021-02-15 ENCOUNTER — Other Ambulatory Visit: Payer: Self-pay | Admitting: Family Medicine

## 2021-02-15 ENCOUNTER — Other Ambulatory Visit: Payer: Self-pay | Admitting: Oncology

## 2021-02-15 ENCOUNTER — Ambulatory Visit (HOSPITAL_BASED_OUTPATIENT_CLINIC_OR_DEPARTMENT_OTHER)
Admission: RE | Admit: 2021-02-15 | Discharge: 2021-02-15 | Disposition: A | Discharge status: Home / Self Care | Payer: No Typology Code available for payment source | Source: Ambulatory Visit | Attending: Oncology | Admitting: Oncology

## 2021-02-15 ENCOUNTER — Other Ambulatory Visit: Payer: Self-pay | Admitting: *Deleted

## 2021-02-15 ENCOUNTER — Encounter: Payer: Self-pay | Admitting: Oncology

## 2021-02-15 ENCOUNTER — Other Ambulatory Visit: Payer: Self-pay | Admitting: Cardiology

## 2021-02-15 ENCOUNTER — Other Ambulatory Visit: Payer: Self-pay

## 2021-02-15 ENCOUNTER — Other Ambulatory Visit (HOSPITAL_BASED_OUTPATIENT_CLINIC_OR_DEPARTMENT_OTHER): Payer: Self-pay

## 2021-02-15 ENCOUNTER — Other Ambulatory Visit: Payer: Self-pay | Admitting: Nurse Practitioner

## 2021-02-15 ENCOUNTER — Encounter (HOSPITAL_BASED_OUTPATIENT_CLINIC_OR_DEPARTMENT_OTHER): Payer: Self-pay

## 2021-02-15 DIAGNOSIS — C221 Intrahepatic bile duct carcinoma: Secondary | ICD-10-CM

## 2021-02-15 MED ORDER — INSULIN LISPRO (1 UNIT DIAL) 100 UNIT/ML (KWIKPEN)
PEN_INJECTOR | SUBCUTANEOUS | 0 refills | Status: DC
  Filled 2021-02-15: qty 15, 42d supply, fill #0

## 2021-02-15 MED ORDER — BISOPROLOL-HYDROCHLOROTHIAZIDE 10-6.25 MG PO TABS
ORAL_TABLET | ORAL | 0 refills | Status: DC
  Filled 2021-02-15: qty 30, 30d supply, fill #0

## 2021-02-15 MED ORDER — IOHEXOL 300 MG/ML  SOLN
85.0000 mL | Freq: Once | INTRAMUSCULAR | Status: AC | PRN
  Administered 2021-02-15: 85 mL via INTRAVENOUS

## 2021-02-15 MED ORDER — TRAMADOL HCL 50 MG PO TABS
ORAL_TABLET | ORAL | 0 refills | Status: DC
  Filled 2021-02-15: qty 30, 8d supply, fill #0

## 2021-02-15 NOTE — Progress Notes (Addendum)
Patient on radiology table and asking to add on CT chest today. OK per Dr. Benay Spice. Will see if insurance will approve. Per managed care: CT chest will not be approved today. Informed radiology he can cancel everything today and reschedule once PA is completed or proceed w/abdomen and pelvis today and then return for chest once approved. Vivien Rota will relay this to patient.

## 2021-02-15 NOTE — Telephone Encounter (Signed)
Placed on providers desk

## 2021-02-16 ENCOUNTER — Other Ambulatory Visit (HOSPITAL_BASED_OUTPATIENT_CLINIC_OR_DEPARTMENT_OTHER): Payer: Self-pay

## 2021-02-16 ENCOUNTER — Inpatient Hospital Stay: Payer: No Typology Code available for payment source

## 2021-02-16 ENCOUNTER — Encounter: Payer: Self-pay | Admitting: *Deleted

## 2021-02-16 ENCOUNTER — Other Ambulatory Visit: Payer: Self-pay

## 2021-02-16 ENCOUNTER — Inpatient Hospital Stay: Payer: No Typology Code available for payment source | Admitting: Oncology

## 2021-02-16 VITALS — BP 137/98 | HR 73 | Temp 98.1°F | Resp 19 | Ht 73.0 in | Wt 242.3 lb

## 2021-02-16 DIAGNOSIS — C221 Intrahepatic bile duct carcinoma: Secondary | ICD-10-CM | POA: Diagnosis not present

## 2021-02-16 DIAGNOSIS — Z5111 Encounter for antineoplastic chemotherapy: Secondary | ICD-10-CM | POA: Diagnosis not present

## 2021-02-16 DIAGNOSIS — Z95828 Presence of other vascular implants and grafts: Secondary | ICD-10-CM

## 2021-02-16 LAB — CBC WITH DIFFERENTIAL (CANCER CENTER ONLY)
Abs Immature Granulocytes: 0.9 10*3/uL — ABNORMAL HIGH (ref 0.00–0.07)
Basophils Absolute: 0 10*3/uL (ref 0.0–0.1)
Basophils Relative: 0 %
Eosinophils Absolute: 0.2 10*3/uL (ref 0.0–0.5)
Eosinophils Relative: 1 %
HCT: 31.2 % — ABNORMAL LOW (ref 39.0–52.0)
Hemoglobin: 10.5 g/dL — ABNORMAL LOW (ref 13.0–17.0)
Immature Granulocytes: 6 %
Lymphocytes Relative: 8 %
Lymphs Abs: 1.2 10*3/uL (ref 0.7–4.0)
MCH: 34.2 pg — ABNORMAL HIGH (ref 26.0–34.0)
MCHC: 33.7 g/dL (ref 30.0–36.0)
MCV: 101.6 fL — ABNORMAL HIGH (ref 80.0–100.0)
Monocytes Absolute: 1.4 10*3/uL — ABNORMAL HIGH (ref 0.1–1.0)
Monocytes Relative: 10 %
Neutro Abs: 10.8 10*3/uL — ABNORMAL HIGH (ref 1.7–7.7)
Neutrophils Relative %: 75 %
Platelet Count: 31 10*3/uL — ABNORMAL LOW (ref 150–400)
RBC: 3.07 MIL/uL — ABNORMAL LOW (ref 4.22–5.81)
RDW: 15.8 % — ABNORMAL HIGH (ref 11.5–15.5)
WBC Count: 14.6 10*3/uL — ABNORMAL HIGH (ref 4.0–10.5)
nRBC: 0 % (ref 0.0–0.2)

## 2021-02-16 LAB — CMP (CANCER CENTER ONLY)
ALT: 30 U/L (ref 0–44)
AST: 65 U/L — ABNORMAL HIGH (ref 15–41)
Albumin: 3.5 g/dL (ref 3.5–5.0)
Alkaline Phosphatase: 256 U/L — ABNORMAL HIGH (ref 38–126)
Anion gap: 5 (ref 5–15)
BUN: 13 mg/dL (ref 6–20)
CO2: 31 mmol/L (ref 22–32)
Calcium: 9.6 mg/dL (ref 8.9–10.3)
Chloride: 100 mmol/L (ref 98–111)
Creatinine: 0.8 mg/dL (ref 0.61–1.24)
GFR, Estimated: >60 mL/min (ref >60)
Glucose, Bld: 200 mg/dL — ABNORMAL HIGH (ref 70–99)
Potassium: 4 mmol/L (ref 3.5–5.1)
Sodium: 136 mmol/L (ref 135–145)
Total Bilirubin: 0.6 mg/dL (ref 0.3–1.2)
Total Protein: 7.3 g/dL (ref 6.5–8.1)

## 2021-02-16 LAB — MAGNESIUM: Magnesium: 1.9 mg/dL (ref 1.7–2.4)

## 2021-02-16 MED ORDER — HEPARIN SOD (PORK) LOCK FLUSH 100 UNIT/ML IV SOLN
500.0000 [IU] | Freq: Once | INTRAVENOUS | Status: AC
  Administered 2021-02-16: 500 [IU] via INTRAVENOUS

## 2021-02-16 MED ORDER — SODIUM CHLORIDE 0.9% FLUSH
10.0000 mL | INTRAVENOUS | Status: AC | PRN
  Administered 2021-02-16: 10 mL via INTRAVENOUS

## 2021-02-16 NOTE — Progress Notes (Signed)
[facility] OFFICE PROGRESS NOTE   Diagnosis: Cholangiocarcinoma  INTERVAL HISTORY:   Mr. Storlie completed another cycle of gemcitabine and Durvalumab beginning 01/30/2021.  He last received gemcitabine 02/06/2021.  He was treated with Fulphila on 02/07/2021.  He took a vacation to [place] several days later.  He developed severe low back pain while in [place].  This lasted for approximately 12 hours.  He took Percocet and tramadol for relief of the pain.  The pain was severe. He reports improvement in neuropathy symptoms.  He continues to have numbness in the fingers and toes.  He has pruritus. While in [place] he noted a purpuric rash at the left upper back. Objective:  Vital signs in last 24 hours:  Blood pressure (!) 137/98, pulse 73, temperature 98.1 F (36.7 C), temperature source Oral, resp. rate 19, height 6' 1" (1.854 m), weight 242 lb 4.8 oz (109.9 kg), SpO2 100 %.    HEENT: No thrush or ulcers Resp: Lungs clear bilaterally Cardio: Regular rate and rhythm GI: Tender in the right upper abdomen, no hepatomegaly, no splenomegaly Vascular: No leg edema  Skin: No petechiae  Portacath/PICC-without erythema  Lab Results:  Lab Results  Component Value Date   WBC 14.6 (H) 02/16/2021   HGB 10.5 (L) 02/16/2021   HCT 31.2 (L) 02/16/2021   MCV 101.6 (H) 02/16/2021   PLT 31 (L) 02/16/2021   NEUTROABS 10.8 (H) 02/16/2021    CMP  Lab Results  Component Value Date   NA 136 02/16/2021   K 4.0 02/16/2021   CL 100 02/16/2021   CO2 31 02/16/2021   GLUCOSE 200 (H) 02/16/2021   BUN 13 02/16/2021   CREATININE 0.80 02/16/2021   CALCIUM 9.6 02/16/2021   PROT 7.3 02/16/2021   ALBUMIN 3.5 02/16/2021   AST 65 (H) 02/16/2021   ALT 30 02/16/2021   ALKPHOS 256 (H) 02/16/2021   BILITOT 0.6 02/16/2021   GFRNONAA >60 02/16/2021   GFRAA 127 01/11/2020    Lab Results  Component Value Date   CEA1 2.15 01/14/2020   CAN199 82 (H) 08/21/2020    Imaging:  CT CHEST  ABDOMEN PELVIS W CONTRAST  Result Date: 02/15/2021 CLINICAL DATA:  Restaging cholangiocarcinoma. EXAM: CT CHEST, ABDOMEN, AND PELVIS WITH CONTRAST TECHNIQUE: Multidetector CT imaging of the chest, abdomen and pelvis was performed following the standard protocol during bolus administration of intravenous contrast. RADIATION DOSE REDUCTION: This exam was performed according to the departmental dose-optimization program which includes automated exposure control, adjustment of the mA and/or kV according to patient size and/or use of iterative reconstruction technique. CONTRAST:  58m OMNIPAQUE IOHEXOL 300 MG/ML  SOLN COMPARISON:  Multiple prior imaging studies. The most recent is a CT scan from 11/10/2020 FINDINGS: CT CHEST FINDINGS Cardiovascular: The heart is normal in size. No pericardial effusion. The aorta is normal in caliber. No dissection. Stable age advanced coronary artery calcifications. The right-sided Port-A-Cath is in good position without complicating features. Mediastinum/Nodes: Small scattered mediastinal and hilar lymph nodes are stable. No mass or overt adenopathy. The esophagus is grossly normal. Lungs/Pleura: No worrisome pulmonary lesions or pulmonary nodules to suggest metastatic disease. No acute pulmonary findings. No pleural effusions or pleural nodules. Musculoskeletal: No significant bony findings. CT ABDOMEN PELVIS FINDINGS Hepatobiliary: The large fluent heterogeneously enhancing central liver mass measures approximately 12.2 x 9.5 cm. On the prior most recent study it measured 11.5 x 9.0 cm. Numerous rim enhancing metastatic lesions are seen throughout the right hepatic lobe. These have increased in  size when compared to the prior study. 2.7 cm lesion at the hepatic dome on image 42/2 previously measured approximately 10.5 mm. On the same image number more laterally and posteriorly the second lesion measures 2.7 cm and previously measured 1.3 cm. More medially and posteriorly at the  hepatic dome on image 46/2 there is a 2.7 cm lesion which previously measured 1.7 cm. Bilobed enhancing lesion near the gallbladder fossa on image 71/2 has a maximum transverse diameter of 23 mm. This was previously 11.5 mm. Chronically thrombosed left portal vein. Pancreas: No mass, inflammation or ductal dilatation. Spleen: Mildly progressive splenomegaly. The spleen measures 17.5 x 17.7 x 12.5 cm (previously 15 x 14 x 11 cm). Adrenals/Urinary Tract: The adrenal glands kidneys are unremarkable. The bladder is unremarkable. Stomach/Bowel: The stomach, duodenum, small bowel and colon are unremarkable. Vascular/Lymphatic: Scattered aortic and iliac artery calcifications but no aneurysm or dissection. Persistent periportal lymphadenopathy. 18 mm celiac axis node on image 61/2 previously measured 19.5 mm. Partially necrotic node near the pancreatic head on image 64/2 measures 20 mm and previously measured 23 mm. Hepatoduodenal ligament lymph node on image 69/2 measures 20 mm and previously measured 20 mm. Numerous small scattered retroperitoneal lymph nodes appears stable. Reproductive: The prostate gland and seminal vesicles are unremarkable. Other: No pelvic mass or adenopathy. No free pelvic fluid collections. No inguinal mass or adenopathy. No abdominal wall hernia or subcutaneous lesions. Musculoskeletal: No significant bony findings. IMPRESSION: 1. Large central liver mass has slightly increased in size when compared to the prior study. 2. Progressive metastatic lesions involving the right hepatic lobe. 3. Stable to slightly smaller periportal lymph nodes. 4. No findings for metastatic disease involving the chest. 5. Mildly progressive splenomegaly. 6. Chronically thrombosed left portal vein. Aortic Atherosclerosis (ICD10-I70.0). Electronically Signed   By: Marijo Sanes M.D.   On: 02/15/2021 14:13    Medications: I have reviewed the patient's current medications.   Assessment/Plan:  Cholangiocarcinoma   multiple liver masses and abdominal lymphadenopathy CTs 01/13/2020-rounded hypodense mass appears to arise from the pancreas neck, multiple rim-enhancing masses in the liver, primarily left liver with segmental dilation of the left lobe Intermatic bile ducts, ill-defined hypodensity the central liver with effacement of the left portal vein, enlarged portacaval and retroperitoneal lymph nodes Ultrasound-guided biopsy of the left liver lesion 01/19/2020-adenocarcinoma, cytokeratin 7+, MSS, tumor mutation burden 1, IDH1 R132C MRI abdomen 01/31/2020-poorly marginated central liver mass, multiple smaller similar satellite liver masses, extrinsic mass-effect at the biliary hilum with intrahepatic biliary ductal dilatation throughout the left liver with mild Intermatic dilatation the superior right liver, no pancreas mass or ductal dilatation, normal spleen size, numerous enlarged enhancing lymph nodes at the porta hepatis, peripancreatic, portacaval, aortocaval, left periaortic chains Cycle 1 gemcitabine/cisplatin 02/04/2020 Cycle 2 gemcitabine/cisplatin 02/28/2020 Cycle 3 gemcitabine/cisplatin 03/20/2020 CT abdomen/pelvis 03/31/2020-mild decrease in central liver tumor, mild decrease in size of liver metastases and upper abdominal adenopathy, new splenomegaly Cycle 4 gemcitabine/cisplatin 04/10/2020 Cycle 5 gemcitabine/cisplatin 05/01/2020 Cycle 6 gemcitabine/cisplatin plus durvalumab 05/22/2020 CTs 06/06/2020- dominant central liver mass mildly enlarged, other liver lesions and abdominal adenopathy is stable, no evidence of metastatic disease to the chest Cycle 7 gemcitabine/cisplatin plus Durvalumab 06/12/2020 Cycle 8 gemcitabine/cisplatin plus Durvalumab 06/30/2020 Cycle 9 gemcitabine/cisplatin plus Durvalumab 07/31/2020 CT abdomen/pelvis 08/20/2020-decreased size of dominant central liver mass, slight increase in size and stable additional liver lesions, stable portacaval node Cycle 10 gemcitabine/cisplatin plus  Durvalumab 08/21/2020 Cycle 11 gemcitabine/cisplatin plus Durvalumab 09/11/2020 Cycle 12 gemcitabine/cisplatin plus Durvalumab 10/02/2020 Cycle 13 gemcitabine/cisplatin plus Durvalumab  10/23/2020 CT abdomen/pelvis 11/10/2020-no change in dominant central liver mass and additional liver lesions, stable upper retroperitoneal adenopathy Cycle 14 gemcitabine/cisplatin plus Durvalumab 11/13/2020 Cycle 15 gemcitabine/cisplatin plus Durvalumab 12/04/2020 Cycle 15 gemcitabine/cisplatin plus Durvalumab 01/08/2021 Cycle 16 gemcitabine/cisplatin plus Durvalumab 01/30/2021-cisplatin held from day 1 and day 8 secondary to neuropathy CTs 02/15/2021-slight increase in size of central liver mass, progressive metastatic lesions in the right liver, stable to slightly decreased periportal lymph nodes, progression of splenomegaly, chronically thrombosed left portal vein   Cough-likely related to diaphragmatic irritation from #1 Anorexia/weight loss Hypercalcemia-likely hypercalcemia malignancy, status post intravenous hydration and Zometa 01/14/2020 Diabetes Hyperlipidemia Hypertension History of peripheral neuropathy secondary to diabetes Severe back pain following Fulphila-oxycodone prescribed Thrombocytopenia secondary to chemotherapy-gemcitabine held with day 1 cycle 8 and then dose reduced COVID-19 infection 07/22/2020, 12/25/2020 Cisplatin induced peripheral neuropathy     Disposition: Mr. Isaacson has been treated with gemcitabine/cisplatin based therapy since December 2021.  I reviewed the restaging CT results and images with him.  There is evidence of disease progression in the liver.  Gemcitabine/cisplatin and Durvalumab will be discontinued.  We discussed treatment options including salvage chemotherapy with FOLFOX and ivosidenib.  I recommend a trial of FOLFOX.  We reviewed potential toxicities associated with the FOLFOX regimen including the chance of nausea/vomiting, mucositis, diarrhea, and hematologic  toxicity.  We discussed the sun sensitivity, rash, hyperpigmentation, and hand/foot syndrome associated with 5-fluorouracil.  We reviewed the allergic reaction and various types of neuropathy seen with oxaliplatin.  He agrees to proceed.  He has thrombocytopenia today.  This is secondary to gemcitabine chemotherapy.  He will call for bleeding or bruising.  He will return for a CBC next week.  He plans a vacation beginning 02/25/2021.  He will return for an office visit and cycle 1 FOLFOX on 03/05/2021.  A chemotherapy plan was entered today.  Betsy Coder, MD  02/16/2021  10:15 AM

## 2021-02-16 NOTE — Progress Notes (Signed)
PATIENT NAVIGATOR PROGRESS NOTE  Name: Matthew Osborne Date: 02/16/2021 MRN: [id number]  DOB: March 15, 1975   Reason for visit:  F/U visit with Dr Benay Spice  Comments:  In appt with Dr Benay Spice, after scan review and discussion of change in therapy, met with Mr and Mrs Herrington to teach FOLFOX therapy and demonstrate portable infusion pump. We discussed possible side effects such as immunosuppression,fatigue, N/VV, cold sensitivity, mouth sores, diarrhea, hand/foot syndrome. Portable pump demonstrated and discussed care of pump and chemo spill prevention and management.  Given contact information to call with any issues or questions     Time spent counseling/coordinating care: > 60 minutes

## 2021-02-16 NOTE — Addendum Note (Signed)
Addended by: Lenox Ponds E on: 02/16/2021 10:35 AM   Modules accepted: Orders

## 2021-02-16 NOTE — Progress Notes (Signed)
DISCONTINUE OFF PATHWAY REGIMEN - Other   [id number]:Cisplatin 25 mg/m2 D1,8 + Gemcitabine 1,000 mg/m2 D1,8 q21 Days:   A cycle is every 21 days:     Gemcitabine      Cisplatin   **Always confirm dose/schedule in your pharmacy ordering system**  REASON: Disease Progression PRIOR TREATMENT: Cisplatin 25 mg/m2 D1,8 + Gemcitabine 1,000 mg/m2 D1,8 q21 Days TREATMENT RESPONSE: Partial Response (PR)  START OFF PATHWAY REGIMEN - Other   [id number]:[id number] (Leucovorin IV D1 + Fluorouracil IV D1/CIV D1,2 + Oxaliplatin IV D1) q14 Days:   A cycle is every 14 days:     Oxaliplatin      Leucovorin      Fluorouracil      Fluorouracil   **Always confirm dose/schedule in your pharmacy ordering system**  Patient Characteristics: Intent of Therapy: Non-Curative / Palliative Intent, Discussed with Patient

## 2021-02-19 ENCOUNTER — Encounter: Payer: Self-pay | Admitting: *Deleted

## 2021-02-19 ENCOUNTER — Other Ambulatory Visit (HOSPITAL_BASED_OUTPATIENT_CLINIC_OR_DEPARTMENT_OTHER): Payer: Self-pay

## 2021-02-19 NOTE — Progress Notes (Signed)
Faxed CT report, office note and new treatment plan to [facility] [phone] att: Anderson Malta.

## 2021-02-21 ENCOUNTER — Encounter: Payer: Self-pay | Admitting: *Deleted

## 2021-02-21 ENCOUNTER — Other Ambulatory Visit: Payer: Self-pay

## 2021-02-21 ENCOUNTER — Inpatient Hospital Stay: Payer: No Typology Code available for payment source

## 2021-02-21 DIAGNOSIS — Z5111 Encounter for antineoplastic chemotherapy: Secondary | ICD-10-CM | POA: Diagnosis not present

## 2021-02-21 DIAGNOSIS — C221 Intrahepatic bile duct carcinoma: Secondary | ICD-10-CM

## 2021-02-21 LAB — CBC WITH DIFFERENTIAL (CANCER CENTER ONLY)
Abs Immature Granulocytes: 0.05 10*3/uL (ref 0.00–0.07)
Basophils Absolute: 0.1 10*3/uL (ref 0.0–0.1)
Basophils Relative: 1 %
Eosinophils Absolute: 0.4 10*3/uL (ref 0.0–0.5)
Eosinophils Relative: 4 %
HCT: 36.1 % — ABNORMAL LOW (ref 39.0–52.0)
Hemoglobin: 11.9 g/dL — ABNORMAL LOW (ref 13.0–17.0)
Immature Granulocytes: 1 %
Lymphocytes Relative: 14 %
Lymphs Abs: 1.3 10*3/uL (ref 0.7–4.0)
MCH: 34.5 pg — ABNORMAL HIGH (ref 26.0–34.0)
MCHC: 33 g/dL (ref 30.0–36.0)
MCV: 104.6 fL — ABNORMAL HIGH (ref 80.0–100.0)
Monocytes Absolute: 1 10*3/uL (ref 0.1–1.0)
Monocytes Relative: 11 %
Neutro Abs: 6.6 10*3/uL (ref 1.7–7.7)
Neutrophils Relative %: 69 %
Platelet Count: 197 10*3/uL (ref 150–400)
RBC: 3.45 MIL/uL — ABNORMAL LOW (ref 4.22–5.81)
RDW: 16.6 % — ABNORMAL HIGH (ref 11.5–15.5)
WBC Count: 9.5 10*3/uL (ref 4.0–10.5)
nRBC: 0 % (ref 0.0–0.2)

## 2021-02-21 NOTE — Progress Notes (Unsigned)
Faxed 1/13 office note and new treatment plan to [facility] # [phone].

## 2021-02-22 ENCOUNTER — Encounter: Payer: Self-pay | Admitting: *Deleted

## 2021-02-22 NOTE — Progress Notes (Signed)
Received call from Everlean Cherry from [facility] [employer] [facility] for chemo authorization for new regimen of FOLFOX. She stated that she was faxing authorization and confirmed fax number [phone].

## 2021-02-23 ENCOUNTER — Inpatient Hospital Stay: Payer: No Typology Code available for payment source

## 2021-02-24 ENCOUNTER — Inpatient Hospital Stay: Payer: No Typology Code available for payment source

## 2021-02-26 ENCOUNTER — Encounter: Payer: Self-pay | Admitting: General Practice

## 2021-02-26 NOTE — Progress Notes (Signed)
[facility] Spiritual Care Note  Followed up with Sundstrom family by phone per referral from [facility] for emotional support regarding progression. Reached Brantlee's wife [name], whom I know from working in [place], where she is a Marine scientist. Provided opportunity for [name] to share and process updates. Family is currently in [facility] for work. Plan to follow up with Erven by phone on Thursday per Karen's suggestion.   [place], [place], [facility] Pager [phone] Voicemail [phone]

## 2021-03-01 ENCOUNTER — Encounter: Payer: Self-pay | Admitting: General Practice

## 2021-03-01 NOTE — Progress Notes (Signed)
[facility] Spiritual Care Note  Covered some deeply sacred territory with [place] by phone while he was in [place]. He is working to live his best life through compassionate, loving connections with other people, both familiar folks and strangers. He and his wife [name] are aware of ongoing chaplain availability for support, listening, and encouragement along the way.   [place], [place], [facility] Pager [phone] Voicemail [phone]

## 2021-03-02 ENCOUNTER — Telehealth: Payer: No Typology Code available for payment source

## 2021-03-04 ENCOUNTER — Other Ambulatory Visit: Payer: Self-pay | Admitting: Oncology

## 2021-03-05 ENCOUNTER — Inpatient Hospital Stay: Payer: No Typology Code available for payment source

## 2021-03-05 ENCOUNTER — Encounter: Payer: Self-pay | Admitting: *Deleted

## 2021-03-05 ENCOUNTER — Other Ambulatory Visit (HOSPITAL_BASED_OUTPATIENT_CLINIC_OR_DEPARTMENT_OTHER): Payer: Self-pay

## 2021-03-05 ENCOUNTER — Encounter: Payer: Self-pay | Admitting: Nurse Practitioner

## 2021-03-05 ENCOUNTER — Inpatient Hospital Stay: Payer: No Typology Code available for payment source | Admitting: Nurse Practitioner

## 2021-03-05 ENCOUNTER — Other Ambulatory Visit: Payer: Self-pay

## 2021-03-05 VITALS — BP 141/95 | HR 69 | Temp 97.5°F | Resp 18 | Ht 73.0 in | Wt 239.0 lb

## 2021-03-05 DIAGNOSIS — C221 Intrahepatic bile duct carcinoma: Secondary | ICD-10-CM | POA: Diagnosis not present

## 2021-03-05 DIAGNOSIS — Z5111 Encounter for antineoplastic chemotherapy: Secondary | ICD-10-CM | POA: Diagnosis not present

## 2021-03-05 LAB — CBC WITH DIFFERENTIAL (CANCER CENTER ONLY)
Abs Immature Granulocytes: 0.01 10*3/uL (ref 0.00–0.07)
Basophils Absolute: 0.1 10*3/uL (ref 0.0–0.1)
Basophils Relative: 2 %
Eosinophils Absolute: 0.5 10*3/uL (ref 0.0–0.5)
Eosinophils Relative: 9 %
HCT: 33.6 % — ABNORMAL LOW (ref 39.0–52.0)
Hemoglobin: 10.9 g/dL — ABNORMAL LOW (ref 13.0–17.0)
Immature Granulocytes: 0 %
Lymphocytes Relative: 15 %
Lymphs Abs: 0.9 10*3/uL (ref 0.7–4.0)
MCH: 33.6 pg (ref 26.0–34.0)
MCHC: 32.4 g/dL (ref 30.0–36.0)
MCV: 103.7 fL — ABNORMAL HIGH (ref 80.0–100.0)
Monocytes Absolute: 0.7 10*3/uL (ref 0.1–1.0)
Monocytes Relative: 11 %
Neutro Abs: 3.7 10*3/uL (ref 1.7–7.7)
Neutrophils Relative %: 63 %
Platelet Count: 204 10*3/uL (ref 150–400)
RBC: 3.24 MIL/uL — ABNORMAL LOW (ref 4.22–5.81)
RDW: 15.2 % (ref 11.5–15.5)
WBC Count: 6 10*3/uL (ref 4.0–10.5)
nRBC: 0 % (ref 0.0–0.2)

## 2021-03-05 LAB — CMP (CANCER CENTER ONLY)
ALT: 16 U/L (ref 0–44)
AST: 42 U/L — ABNORMAL HIGH (ref 15–41)
Albumin: 3.4 g/dL — ABNORMAL LOW (ref 3.5–5.0)
Alkaline Phosphatase: 154 U/L — ABNORMAL HIGH (ref 38–126)
Anion gap: 5 (ref 5–15)
BUN: 14 mg/dL (ref 6–20)
CO2: 29 mmol/L (ref 22–32)
Calcium: 9.7 mg/dL (ref 8.9–10.3)
Chloride: 102 mmol/L (ref 98–111)
Creatinine: 0.93 mg/dL (ref 0.61–1.24)
GFR, Estimated: >60 mL/min (ref >60)
Glucose, Bld: 177 mg/dL — ABNORMAL HIGH (ref 70–99)
Potassium: 4.4 mmol/L (ref 3.5–5.1)
Sodium: 136 mmol/L (ref 135–145)
Total Bilirubin: 0.5 mg/dL (ref 0.3–1.2)
Total Protein: 7.9 g/dL (ref 6.5–8.1)

## 2021-03-05 MED ORDER — PALONOSETRON HCL INJECTION 0.25 MG/5ML
0.2500 mg | Freq: Once | INTRAVENOUS | Status: AC
  Administered 2021-03-05: 0.25 mg via INTRAVENOUS
  Filled 2021-03-05: qty 5

## 2021-03-05 MED ORDER — DEXTROSE 5 % IV SOLN: Freq: Once | INTRAVENOUS | Status: AC

## 2021-03-05 MED ORDER — TRAMADOL HCL 50 MG PO TABS
ORAL_TABLET | ORAL | 0 refills | Status: DC
  Filled 2021-03-05: qty 30, 8d supply, fill #0

## 2021-03-05 MED ORDER — PROCHLORPERAZINE MALEATE 10 MG PO TABS
ORAL_TABLET | Freq: Four times a day (QID) | ORAL | 1 refills | Status: DC | PRN
  Filled 2021-03-05: qty 60, 15d supply, fill #0
  Filled 2021-05-08: qty 60, 15d supply, fill #1

## 2021-03-05 MED ORDER — OXALIPLATIN CHEMO INJECTION 100 MG/20ML
150.0000 mg | Freq: Once | INTRAVENOUS | Status: AC
  Administered 2021-03-05: 150 mg via INTRAVENOUS
  Filled 2021-03-05: qty 20

## 2021-03-05 MED ORDER — DEXAMETHASONE SODIUM PHOSPHATE 10 MG/ML IJ SOLN
5.0000 mg | Freq: Once | INTRAMUSCULAR | Status: AC
  Administered 2021-03-05: 5 mg via INTRAVENOUS
  Filled 2021-03-05: qty 1

## 2021-03-05 MED ORDER — SODIUM CHLORIDE 0.9 % IV SOLN: 10.0000 mg | Freq: Once | INTRAVENOUS | Status: DC

## 2021-03-05 MED ORDER — SODIUM CHLORIDE 0.9 % IV SOLN
5000.0000 mg | INTRAVENOUS | Status: DC
  Administered 2021-03-05: 5000 mg via INTRAVENOUS
  Filled 2021-03-05: qty 100

## 2021-03-05 MED ORDER — LEUCOVORIN CALCIUM INJECTION 350 MG
400.0000 mg/m2 | Freq: Once | INTRAVENOUS | Status: AC
  Administered 2021-03-05: 944 mg via INTRAVENOUS
  Filled 2021-03-05: qty 47.2

## 2021-03-05 MED ORDER — ONDANSETRON 8 MG PO TBDP
8.0000 mg | ORAL_TABLET | Freq: Three times a day (TID) | ORAL | 1 refills | Status: DC | PRN
  Filled 2021-03-05: qty 30, 10d supply, fill #0
  Filled 2021-05-08: qty 30, 10d supply, fill #1

## 2021-03-05 MED ORDER — FLUOROURACIL CHEMO INJECTION 2.5 GM/50ML
400.0000 mg/m2 | Freq: Once | INTRAVENOUS | Status: AC
  Administered 2021-03-05: 950 mg via INTRAVENOUS
  Filled 2021-03-05: qty 19

## 2021-03-05 NOTE — Progress Notes (Signed)
°[facility] °OFFICE PROGRESS NOTE ° ° °Diagnosis: Cholangiocarcinoma ° °INTERVAL HISTORY:  ° °Mr. Perkin returns as scheduled.  He is seen today prior to proceeding with the first cycle of FOLFOX.  He reports increased abdominal pain and increased cough.  He has numbness bilateral hands and feet. ° °Objective: ° °Vital signs in last 24 hours: ° °Blood pressure (!) 141/95, pulse 69, temperature (!) 97.5 °F (36.4 °C), temperature source Oral, resp. rate 18, height 6' 1" (1.854 m), weight 239 lb (108.4 kg), SpO2 99 %. °  ° °HEENT: No thrush or ulcers. °Resp: Lungs clear bilaterally. °Cardio: Regular rate and rhythm. °GI: Abdomen soft and nontender.  No hepatomegaly. °Vascular: No leg edema. °Neuro: Vibratory sense severely decreased over the fingertips per tuning fork exam. °Skin: Palms without erythema. °Port-A-Cath without erythema. ° ° °Lab Results: ° °Lab Results  °Component Value Date  ° WBC 6.0 03/05/2021  ° HGB 10.9 (L) 03/05/2021  ° HCT 33.6 (L) 03/05/2021  ° MCV 103.7 (H) 03/05/2021  ° PLT 204 03/05/2021  ° NEUTROABS 3.7 03/05/2021  ° ° °Imaging: ° °No results found. ° °Medications: I have reviewed the patient's current medications. ° °Assessment/Plan: °Cholangiocarcinoma  multiple liver masses and abdominal lymphadenopathy °CTs 01/13/2020-rounded hypodense mass appears to arise from the pancreas neck, multiple rim-enhancing masses in the liver, primarily left liver with segmental dilation of the left lobe Intermatic bile ducts, ill-defined hypodensity the central liver with effacement of the left portal vein, enlarged portacaval and retroperitoneal lymph nodes °Ultrasound-guided biopsy of the left liver lesion 01/19/2020-adenocarcinoma, cytokeratin 7+, MSS, tumor mutation burden 1, IDH1 R132C °MRI abdomen 01/31/2020-poorly marginated central liver mass, multiple smaller similar satellite liver masses, extrinsic mass-effect at the biliary hilum with intrahepatic biliary ductal dilatation  throughout the left liver with mild Intermatic dilatation the superior right liver, no pancreas mass or ductal dilatation, normal spleen size, numerous enlarged enhancing lymph nodes at the porta hepatis, peripancreatic, portacaval, aortocaval, left periaortic chains °Cycle 1 gemcitabine/cisplatin 02/04/2020 °Cycle 2 gemcitabine/cisplatin 02/28/2020 °Cycle 3 gemcitabine/cisplatin 03/20/2020 °CT abdomen/pelvis 03/31/2020-mild decrease in central liver tumor, mild decrease in size of liver metastases and upper abdominal adenopathy, new splenomegaly °Cycle 4 gemcitabine/cisplatin 04/10/2020 °Cycle 5 gemcitabine/cisplatin 05/01/2020 °Cycle 6 gemcitabine/cisplatin plus durvalumab 05/22/2020 °CTs 06/06/2020- dominant central liver mass mildly enlarged, other liver lesions and abdominal adenopathy is stable, no evidence of metastatic disease to the chest °Cycle 7 gemcitabine/cisplatin plus Durvalumab 06/12/2020 °Cycle 8 gemcitabine/cisplatin plus Durvalumab 06/30/2020 °Cycle 9 gemcitabine/cisplatin plus Durvalumab 07/31/2020 °CT abdomen/pelvis 08/20/2020-decreased size of dominant central liver mass, slight increase in size and stable additional liver lesions, stable portacaval node °Cycle 10 gemcitabine/cisplatin plus Durvalumab 08/21/2020 °Cycle 11 gemcitabine/cisplatin plus Durvalumab 09/11/2020 °Cycle 12 gemcitabine/cisplatin plus Durvalumab 10/02/2020 °Cycle 13 gemcitabine/cisplatin plus Durvalumab 10/23/2020 °CT abdomen/pelvis 11/10/2020-no change in dominant central liver mass and additional liver lesions, stable upper retroperitoneal adenopathy °Cycle 14 gemcitabine/cisplatin plus Durvalumab 11/13/2020 °Cycle 15 gemcitabine/cisplatin plus Durvalumab 12/04/2020 °Cycle 15 gemcitabine/cisplatin plus Durvalumab 01/08/2021 °Cycle 16 gemcitabine/cisplatin plus Durvalumab 01/30/2021-cisplatin held from day 1 and day 8 secondary to neuropathy °CTs 02/15/2021-slight increase in size of central liver mass, progressive metastatic lesions in the  right liver, stable to slightly decreased periportal lymph nodes, progression of splenomegaly, chronically thrombosed left portal vein °Cycle 1 FOLFOX 03/05/2021 °  °Cough-likely related to diaphragmatic irritation from #1 °Anorexia/weight loss °Hypercalcemia-likely hypercalcemia malignancy, status post intravenous hydration and Zometa 01/14/2020 °Diabetes °Hyperlipidemia °Hypertension °History of peripheral neuropathy secondary to diabetes °Severe back pain following Fulphila-oxycodone prescribed °Thrombocytopenia secondary to chemotherapy-gemcitabine   held with day 1 cycle 8 and then dose reduced °COVID-19 infection 07/22/2020, 12/25/2020 °Cisplatin induced peripheral neuropathy ° °Disposition: Mr. Schinke appears stable.  He is scheduled to begin treatment with cycle 1 FOLFOX today.  We again reviewed potential toxicities.  He has baseline neuropathy.  He understands the potential for the neuropathy to worsen and be permanent.  He is willing to accept this risk.  Plan to proceed with cycle 1 FOLFOX today as scheduled. ° °We reviewed the CBC and chemistry panel from today.  Labs adequate to proceed with treatment. ° °He will return for lab, follow-up, cycle 2 FOLFOX in 2 weeks.  He will contact the office in the interim with any problems. ° °Patient seen with Dr. Sherrill. ° °Lisa Thomas ANP/GNP-BC  ° °03/05/2021  °9:12 AM ° °This was a shared visit with Lisa Thomas.  Mr. Sciara will begin second line systemic therapy with FOLFOX today.  He has significant neuropathy symptoms.  We discussed the risk of progressive and permanent neuropathy.  He would like to proceed with FOLFOX.  I dose reduced the oxaliplatin based on his neuropathy, extended previous chemotherapy, and history of hematologic toxicity. ° °I was present for greater than 50% of today's visit.  I performed medical decision making. ° °Brad Sherrill, MD ° ° ° ° ° °

## 2021-03-05 NOTE — Progress Notes (Signed)
Patient seen by Ned Card NP today  Vitals are within treatment parameters.  Labs reviewed by Ned Card NP and are within treatment parameters.  Per physician team, patient is ready for treatment. Please note that modifications are being made to the treatment plan including Due to peripheral neuropathy, Oxaliplatin dose reduced to 65 mg/m2

## 2021-03-05 NOTE — Patient Instructions (Signed)
[facility]   °Discharge Instructions: °Thank you for choosing [facility] to provide your oncology and hematology care.  ° °If you have a lab appointment with the Cancer Center, please go directly to the Cancer Center and check in at the registration area. °  °Wear comfortable clothing and clothing appropriate for easy access to any Portacath or PICC line.  ° °We strive to give you quality time with your provider. You may need to reschedule your appointment if you arrive late (15 or more minutes).  Arriving late affects you and other patients whose appointments are after yours.  Also, if you miss three or more appointments without notifying the office, you may be dismissed from the clinic at the provider’s discretion.    °  °For prescription refill requests, have your pharmacy contact our office and allow 72 hours for refills to be completed.   ° °Today you received the following chemotherapy and/or immunotherapy agents Oxaliplatin (ELOXATIN), Leucovorin & Flourouracil (ADRUCIL).    °  °To help prevent nausea and vomiting after your treatment, we encourage you to take your nausea medication as directed. ° °BELOW ARE SYMPTOMS THAT SHOULD BE REPORTED IMMEDIATELY: °*FEVER GREATER THAN 100.4 F (38 °C) OR HIGHER °*CHILLS OR SWEATING °*NAUSEA AND VOMITING THAT IS NOT CONTROLLED WITH YOUR NAUSEA MEDICATION °*UNUSUAL SHORTNESS OF BREATH °*UNUSUAL BRUISING OR BLEEDING °*URINARY PROBLEMS (pain or burning when urinating, or frequent urination) °*BOWEL PROBLEMS (unusual diarrhea, constipation, pain near the anus) °TENDERNESS IN MOUTH AND THROAT WITH OR WITHOUT PRESENCE OF ULCERS (sore throat, sores in mouth, or a toothache) °UNUSUAL RASH, SWELLING OR PAIN  °UNUSUAL VAGINAL DISCHARGE OR ITCHING  ° °Items with * indicate a potential emergency and should be followed up as soon as possible or go to the Emergency Department if any problems should occur. ° °Please show the CHEMOTHERAPY ALERT  CARD or IMMUNOTHERAPY ALERT CARD at check-in to the Emergency Department and triage nurse. ° °Should you have questions after your visit or need to cancel or reschedule your appointment, please contact [facility]  Dept: [phone]  and follow the prompts.  Office hours are 8:00 a.m. to 4:30 p.m. Monday - Friday. Please note that voicemails left after 4:00 p.m. may not be returned until the following business day.  We are closed weekends and major holidays. You have access to a nurse at all times for urgent questions. Please call the main number to the clinic Dept: [phone] and follow the prompts. ° ° °For any non-urgent questions, you may also contact your provider using MyChart. We now offer e-Visits for anyone 18 and older to request care online for non-urgent symptoms. For details visit [email or web address]. °  °Also download the MyChart app! Go to the app store, search "MyChart", open the app, select [facility], and log in with your MyChart username and password. ° °Due to Covid, a mask is required upon entering the hospital/clinic. If you do not have a mask, one will be given to you upon arrival. For doctor visits, patients may have 1 support person aged 18 or older with them. For treatment visits, patients cannot have anyone with them due to current Covid guidelines and our immunocompromised population.  ° °Oxaliplatin Injection °What is this medication? °OXALIPLATIN (ox AL i PLA tin) is a chemotherapy drug. It targets fast dividing cells, like cancer cells, and causes these cells to die. This medicine is used to treat cancers of the colon and rectum, and   many other cancers. This medicine may be used for other purposes; ask your health care provider or pharmacist if you have questions. COMMON BRAND NAME(S): Eloxatin What should I tell my care team before I take this medication? They need to know if you have any of these conditions: heart disease history of irregular  heartbeat liver disease low blood counts, like white cells, platelets, or red blood cells lung or breathing disease, like asthma take medicines that treat or prevent blood clots tingling of the fingers or toes, or other nerve disorder an unusual or allergic reaction to oxaliplatin, other chemotherapy, other medicines, foods, dyes, or preservatives pregnant or trying to get pregnant breast-feeding How should I use this medication? This drug is given as an infusion into a vein. It is administered in a hospital or clinic by a specially trained health care professional. Talk to your pediatrician regarding the use of this medicine in children. Special care may be needed. Overdosage: If you think you have taken too much of this medicine contact a poison control center or emergency room at once. NOTE: This medicine is only for you. Do not share this medicine with others. What if I miss a dose? It is important not to miss a dose. Call your doctor or health care professional if you are unable to keep an appointment. What may interact with this medication? Do not take this medicine with any of the following medications: cisapride dronedarone pimozide thioridazine This medicine may also interact with the following medications: aspirin and aspirin-like medicines certain medicines that treat or prevent blood clots like warfarin, apixaban, dabigatran, and rivaroxaban cisplatin cyclosporine diuretics medicines for infection like acyclovir, adefovir, amphotericin B, bacitracin, cidofovir, foscarnet, ganciclovir, gentamicin, pentamidine, vancomycin NSAIDs, medicines for pain and inflammation, like ibuprofen or naproxen other medicines that prolong the QT interval (an abnormal heart rhythm) pamidronate zoledronic acid This list may not describe all possible interactions. Give your health care provider a list of all the medicines, herbs, non-prescription drugs, or dietary supplements you use. Also tell  them if you smoke, drink alcohol, or use illegal drugs. Some items may interact with your medicine. What should I watch for while using this medication? Your condition will be monitored carefully while you are receiving this medicine. You may need blood work done while you are taking this medicine. This medicine may make you feel generally unwell. This is not uncommon as chemotherapy can affect healthy cells as well as cancer cells. Report any side effects. Continue your course of treatment even though you feel ill unless your healthcare professional tells you to stop. This medicine can make you more sensitive to cold. Do not drink cold drinks or use ice. Cover exposed skin before coming in contact with cold temperatures or cold objects. When out in cold weather wear warm clothing and cover your mouth and nose to warm the air that goes into your lungs. Tell your doctor if you get sensitive to the cold. Do not become pregnant while taking this medicine or for 9 months after stopping it. Women should inform their health care professional if they wish to become pregnant or think they might be pregnant. Men should not father a child while taking this medicine and for 6 months after stopping it. There is potential for serious side effects to an unborn child. Talk to your health care professional for more information. Do not breast-feed a child while taking this medicine or for 3 months after stopping it. This medicine has caused ovarian failure in  some women. This medicine may make it more difficult to get pregnant. Talk to your health care professional if you are concerned about your fertility. °This medicine has caused decreased sperm counts in some men. This may make it more difficult to father a child. Talk to your health care professional if you are concerned about your fertility. °This medicine may increase your risk of getting an infection. Call your health care professional for advice if you get a fever,  chills, or sore throat, or other symptoms of a cold or flu. Do not treat yourself. Try to avoid being around people who are sick. °Avoid taking medicines that contain aspirin, acetaminophen, ibuprofen, naproxen, or ketoprofen unless instructed by your health care professional. These medicines may hide a fever. °Be careful brushing or flossing your teeth or using a toothpick because you may get an infection or bleed more easily. If you have any dental work done, tell your dentist you are receiving this medicine. °What side effects may I notice from receiving this medication? °Side effects that you should report to your doctor or health care professional as soon as possible: °allergic reactions like skin rash, itching or hives, swelling of the face, lips, or tongue °breathing problems °cough °low blood counts - this medicine may decrease the number of white blood cells, red blood cells, and platelets. You may be at increased risk for infections and bleeding °nausea, vomiting °pain, redness, or irritation at site where injected °pain, tingling, numbness in the hands or feet °signs and symptoms of bleeding such as bloody or black, tarry stools; red or dark brown urine; spitting up blood or brown material that looks like coffee grounds; red spots on the skin; unusual bruising or bleeding from the eyes, gums, or nose °signs and symptoms of a dangerous change in heartbeat or heart rhythm like chest pain; dizziness; fast, irregular heartbeat; palpitations; feeling faint or lightheaded; falls °signs and symptoms of infection like fever; chills; cough; sore throat; pain or trouble passing urine °signs and symptoms of liver injury like dark yellow or brown urine; general ill feeling or flu-like symptoms; light-colored stools; loss of appetite; nausea; right upper belly pain; unusually weak or tired; yellowing of the eyes or skin °signs and symptoms of low red blood cells or anemia such as unusually weak or tired; feeling faint  or lightheaded; falls °signs and symptoms of muscle injury like dark urine; trouble passing urine or change in the amount of urine; unusually weak or tired; muscle pain; back pain °Side effects that usually do not require medical attention (report to your doctor or health care professional if they continue or are bothersome): °changes in taste °diarrhea °gas °hair loss °loss of appetite °mouth sores °This list may not describe all possible side effects. Call your doctor for medical advice about side effects. You may report side effects to FDA at 1-800-FDA-1088. °Where should I keep my medication? °This drug is given in a hospital or clinic and will not be stored at home. °NOTE: This sheet is a summary. It may not cover all possible information. If you have questions about this medicine, talk to your doctor, pharmacist, or health care provider. °© 2022 [employer]vier/Gold Standard (2020-10-10 00:00:00) ° °Leucovorin injection °What is this medication? °LEUCOVORIN (loo koe VOR in) is used to prevent or treat the harmful effects of some medicines. This medicine is used to treat anemia caused by a low amount of folic acid in the body. It is also used with 5-fluorouracil (5-FU) to treat colon   cancer. This medicine may be used for other purposes; ask your health care provider or pharmacist if you have questions. What should I tell my care team before I take this medication? They need to know if you have any of these conditions: anemia from low levels of vitamin B-12 in the blood an unusual or allergic reaction to leucovorin, folic acid, other medicines, foods, dyes, or preservatives pregnant or trying to get pregnant breast-feeding How should I use this medication? This medicine is for injection into a muscle or into a vein. It is given by a health care professional in a hospital or clinic setting. Talk to your pediatrician regarding the use of this medicine in children. Special care may be needed. Overdosage: If you  think you have taken too much of this medicine contact a poison control center or emergency room at once. NOTE: This medicine is only for you. Do not share this medicine with others. What if I miss a dose? This does not apply. What may interact with this medication? capecitabine fluorouracil phenobarbital phenytoin primidone trimethoprim-sulfamethoxazole This list may not describe all possible interactions. Give your health care provider a list of all the medicines, herbs, non-prescription drugs, or dietary supplements you use. Also tell them if you smoke, drink alcohol, or use illegal drugs. Some items may interact with your medicine. What should I watch for while using this medication? Your condition will be monitored carefully while you are receiving this medicine. This medicine may increase the side effects of 5-fluorouracil, 5-FU. Tell your doctor or health care professional if you have diarrhea or mouth sores that do not get better or that get worse. What side effects may I notice from receiving this medication? Side effects that you should report to your doctor or health care professional as soon as possible: allergic reactions like skin rash, itching or hives, swelling of the face, lips, or tongue breathing problems fever, infection mouth sores unusual bleeding or bruising unusually weak or tired Side effects that usually do not require medical attention (report to your doctor or health care professional if they continue or are bothersome): constipation or diarrhea loss of appetite nausea, vomiting This list may not describe all possible side effects. Call your doctor for medical advice about side effects. You may report side effects to FDA at 1-800-FDA-1088. Where should I keep my medication? This drug is given in a hospital or clinic and will not be stored at home. NOTE: This sheet is a summary. It may not cover all possible information. If you have questions about this  medicine, talk to your doctor, pharmacist, or health care provider.  2022 Elsevier/Gold Standard (2007-07-30 00:00:00)  Fluorouracil, 5-FU injection What is this medication? FLUOROURACIL, 5-FU (flure oh YOOR a sil) is a chemotherapy drug. It slows the growth of cancer cells. This medicine is used to treat many types of cancer like breast cancer, colon or rectal cancer, pancreatic cancer, and stomach cancer. This medicine may be used for other purposes; ask your health care provider or pharmacist if you have questions. COMMON BRAND NAME(S): Adrucil What should I tell my care team before I take this medication? They need to know if you have any of these conditions: blood disorders dihydropyrimidine dehydrogenase (DPD) deficiency infection (especially a virus infection such as chickenpox, cold sores, or herpes) kidney disease liver disease malnourished, poor nutrition recent or ongoing radiation therapy an unusual or allergic reaction to fluorouracil, other chemotherapy, other medicines, foods, dyes, or preservatives pregnant or trying to get pregnant  breast-feeding How should I use this medication? This drug is given as an infusion or injection into a vein. It is administered in a hospital or clinic by a specially trained health care professional. Talk to your pediatrician regarding the use of this medicine in children. Special care may be needed. Overdosage: If you think you have taken too much of this medicine contact a poison control center or emergency room at once. NOTE: This medicine is only for you. Do not share this medicine with others. What if I miss a dose? It is important not to miss your dose. Call your doctor or health care professional if you are unable to keep an appointment. What may interact with this medication? Do not take this medicine with any of the following medications: live virus vaccines This medicine may also interact with the following medications: medicines  that treat or prevent blood clots like warfarin, enoxaparin, and dalteparin This list may not describe all possible interactions. Give your health care provider a list of all the medicines, herbs, non-prescription drugs, or dietary supplements you use. Also tell them if you smoke, drink alcohol, or use illegal drugs. Some items may interact with your medicine. What should I watch for while using this medication? Visit your doctor for checks on your progress. This drug may make you feel generally unwell. This is not uncommon, as chemotherapy can affect healthy cells as well as cancer cells. Report any side effects. Continue your course of treatment even though you feel ill unless your doctor tells you to stop. In some cases, you may be given additional medicines to help with side effects. Follow all directions for their use. Call your doctor or health care professional for advice if you get a fever, chills or sore throat, or other symptoms of a cold or flu. Do not treat yourself. This drug decreases your body's ability to fight infections. Try to avoid being around people who are sick. This medicine may increase your risk to bruise or bleed. Call your doctor or health care professional if you notice any unusual bleeding. Be careful brushing and flossing your teeth or using a toothpick because you may get an infection or bleed more easily. If you have any dental work done, tell your dentist you are receiving this medicine. Avoid taking products that contain aspirin, acetaminophen, ibuprofen, naproxen, or ketoprofen unless instructed by your doctor. These medicines may hide a fever. Do not become pregnant while taking this medicine. Women should inform their doctor if they wish to become pregnant or think they might be pregnant. There is a potential for serious side effects to an unborn child. Talk to your health care professional or pharmacist for more information. Do not breast-feed an infant while taking  this medicine. Men should inform their doctor if they wish to father a child. This medicine may lower sperm counts. Do not treat diarrhea with over the counter products. Contact your doctor if you have diarrhea that lasts more than 2 days or if it is severe and watery. This medicine can make you more sensitive to the sun. Keep out of the sun. If you cannot avoid being in the sun, wear protective clothing and use sunscreen. Do not use sun lamps or tanning beds/booths. What side effects may I notice from receiving this medication? Side effects that you should report to your doctor or health care professional as soon as possible: allergic reactions like skin rash, itching or hives, swelling of the face, lips, or tongue low blood  counts - this medicine may decrease the number of white blood cells, red blood cells and platelets. You may be at increased risk for infections and bleeding. signs of infection - fever or chills, cough, sore throat, pain or difficulty passing urine signs of decreased platelets or bleeding - bruising, pinpoint red spots on the skin, black, tarry stools, blood in the urine signs of decreased red blood cells - unusually weak or tired, fainting spells, lightheadedness breathing problems changes in vision chest pain mouth sores nausea and vomiting pain, swelling, redness at site where injected pain, tingling, numbness in the hands or feet redness, swelling, or sores on hands or feet stomach pain unusual bleeding Side effects that usually do not require medical attention (report to your doctor or health care professional if they continue or are bothersome): changes in finger or toe nails diarrhea dry or itchy skin hair loss headache loss of appetite sensitivity of eyes to the light stomach upset unusually teary eyes This list may not describe all possible side effects. Call your doctor for medical advice about side effects. You may report side effects to FDA at  1-800-FDA-1088. Where should I keep my medication? This drug is given in a hospital or clinic and will not be stored at home. NOTE: This sheet is a summary. It may not cover all possible information. If you have questions about this medicine, talk to your doctor, pharmacist, or health care provider.  2022 Elsevier/Gold Standard (2020-10-10 00:00:00)  The chemotherapy medication bag should finish at 46 hours, 96 hours, or 7 days. For example, if your pump is scheduled for 46 hours and it was put on at 4:00 p.m., it should finish at 2:00 p.m. the day it is scheduled to come off regardless of your appointment time.     Estimated time to finish at 12:30 p.m. on Wednesday 03/07/2021.   If the display on your pump reads "Low Volume" and it is beeping, take the batteries out of the pump and come to the cancer center for it to be taken off.   If the pump alarms go off prior to the pump reading "Low Volume" then call [phone] and someone can assist you.  If the plunger comes out and the chemotherapy medication is leaking out, please use your home chemo spill kit to clean up the spill. Do NOT use paper towels or other household products.  If you have problems or questions regarding your pump, please call either [phone] (24 hours a day) or the cancer center Monday-Friday 8:00 a.m.- 4:30 p.m. at the clinic number and we will assist you. If you are unable to get assistance, then go to the nearest Emergency Department and ask the staff to contact the IV team for assistance.

## 2021-03-05 NOTE — Progress Notes (Signed)
Patient presents for treatment. RN assessment completed along with the following:  Labs/vitals reviewed - Yes, and within treatment parameters.   Weight within 10% of previous measurement - Yes Informed consent completed and reflects current therapy/intent - Yes, on date 03/05/2021             Provider progress note reviewed - Yes, today's provider note was reviewed. Treatment/Antibody/Supportive plan reviewed - Yes, and there are no adjustments needed for today's treatment. S&H and other orders reviewed - Yes, and there are no additional orders identified. Previous treatment date reviewed - Yes, and the appropriate amount of time has elapsed between treatments. Clinic Hand Off Received from - Yes from [facility], RN  Patient to proceed with treatment.

## 2021-03-05 NOTE — Progress Notes (Signed)
Per MD change dexamethasone 10 mg to 5 mg IV push

## 2021-03-06 ENCOUNTER — Encounter: Payer: Self-pay | Admitting: *Deleted

## 2021-03-06 NOTE — Progress Notes (Signed)
Faxed 1/30 office note, chemo orders and med list to [place] att: Wilford Sports, LPN #[phone]

## 2021-03-07 ENCOUNTER — Other Ambulatory Visit: Payer: Self-pay

## 2021-03-07 ENCOUNTER — Inpatient Hospital Stay: Payer: No Typology Code available for payment source | Attending: Oncology

## 2021-03-07 VITALS — BP 135/86 | HR 63 | Temp 95.0°F | Resp 20

## 2021-03-07 DIAGNOSIS — C221 Intrahepatic bile duct carcinoma: Secondary | ICD-10-CM | POA: Diagnosis present

## 2021-03-07 DIAGNOSIS — Z5111 Encounter for antineoplastic chemotherapy: Secondary | ICD-10-CM | POA: Diagnosis not present

## 2021-03-07 DIAGNOSIS — G620 Drug-induced polyneuropathy: Secondary | ICD-10-CM | POA: Diagnosis not present

## 2021-03-07 DIAGNOSIS — D696 Thrombocytopenia, unspecified: Secondary | ICD-10-CM | POA: Diagnosis not present

## 2021-03-07 MED ORDER — SODIUM CHLORIDE 0.9% FLUSH
10.0000 mL | INTRAVENOUS | Status: DC | PRN
  Administered 2021-03-07: 10 mL

## 2021-03-07 MED ORDER — HEPARIN SOD (PORK) LOCK FLUSH 100 UNIT/ML IV SOLN
500.0000 [IU] | Freq: Once | INTRAVENOUS | Status: AC | PRN
  Administered 2021-03-07: 500 [IU]

## 2021-03-08 ENCOUNTER — Encounter: Payer: Self-pay | Admitting: Oncology

## 2021-03-08 NOTE — Progress Notes (Signed)
LATE ENTRY 03/06/2021  Follow up First Time Folfox  Patient denied any side effects with treatment, he denied any nausea, vomiting, he denied any cold sensitivity and stated to this writer that he has not had any problems with cold drinks or food. Patient however did note that his blood sugar was very elevated to the 400's and he had difficulty getting it down to the 100's until the following day after administering more insulin than he usually takes. This Probation officer informed patient that Oxaliplatin is compatible with only dextrose (sugar water) and so advised patient to monitor and keep a log of his blood sugar and insulin dose before his next treatment, during and after the treatment. This will help the physician determine if any elevation in his blood sugar is due to the dextrose administered during the Folfox infusion. Patient was agreeable with this and verbalized understanding. He knows to call the clinic with any complaints or concerns.

## 2021-03-18 ENCOUNTER — Other Ambulatory Visit: Payer: Self-pay | Admitting: Oncology

## 2021-03-19 ENCOUNTER — Inpatient Hospital Stay: Payer: No Typology Code available for payment source

## 2021-03-19 ENCOUNTER — Other Ambulatory Visit: Payer: Self-pay

## 2021-03-19 ENCOUNTER — Ambulatory Visit: Payer: No Typology Code available for payment source | Admitting: *Deleted

## 2021-03-19 ENCOUNTER — Encounter: Payer: Self-pay | Admitting: Nurse Practitioner

## 2021-03-19 ENCOUNTER — Encounter: Payer: Self-pay | Admitting: *Deleted

## 2021-03-19 ENCOUNTER — Other Ambulatory Visit (HOSPITAL_BASED_OUTPATIENT_CLINIC_OR_DEPARTMENT_OTHER): Payer: Self-pay

## 2021-03-19 ENCOUNTER — Inpatient Hospital Stay: Payer: No Typology Code available for payment source | Admitting: Nurse Practitioner

## 2021-03-19 VITALS — BP 158/99 | HR 66 | Temp 98.1°F | Resp 18 | Ht 73.0 in | Wt 244.6 lb

## 2021-03-19 DIAGNOSIS — C221 Intrahepatic bile duct carcinoma: Secondary | ICD-10-CM

## 2021-03-19 DIAGNOSIS — E1165 Type 2 diabetes mellitus with hyperglycemia: Secondary | ICD-10-CM

## 2021-03-19 DIAGNOSIS — Z5111 Encounter for antineoplastic chemotherapy: Secondary | ICD-10-CM | POA: Diagnosis not present

## 2021-03-19 LAB — CBC WITH DIFFERENTIAL (CANCER CENTER ONLY)
Abs Immature Granulocytes: 0.01 10*3/uL (ref 0.00–0.07)
Basophils Absolute: 0.1 10*3/uL (ref 0.0–0.1)
Basophils Relative: 1 %
Eosinophils Absolute: 0.5 10*3/uL (ref 0.0–0.5)
Eosinophils Relative: 9 %
HCT: 35.3 % — ABNORMAL LOW (ref 39.0–52.0)
Hemoglobin: 11.4 g/dL — ABNORMAL LOW (ref 13.0–17.0)
Immature Granulocytes: 0 %
Lymphocytes Relative: 18 %
Lymphs Abs: 1 10*3/uL (ref 0.7–4.0)
MCH: 32.9 pg (ref 26.0–34.0)
MCHC: 32.3 g/dL (ref 30.0–36.0)
MCV: 102 fL — ABNORMAL HIGH (ref 80.0–100.0)
Monocytes Absolute: 0.6 10*3/uL (ref 0.1–1.0)
Monocytes Relative: 11 %
Neutro Abs: 3.5 10*3/uL (ref 1.7–7.7)
Neutrophils Relative %: 61 %
Platelet Count: 91 10*3/uL — ABNORMAL LOW (ref 150–400)
RBC: 3.46 MIL/uL — ABNORMAL LOW (ref 4.22–5.81)
RDW: 15.3 % (ref 11.5–15.5)
WBC Count: 5.7 10*3/uL (ref 4.0–10.5)
nRBC: 0 % (ref 0.0–0.2)

## 2021-03-19 LAB — CMP (CANCER CENTER ONLY)
ALT: 22 U/L (ref 0–44)
AST: 60 U/L — ABNORMAL HIGH (ref 15–41)
Albumin: 3.6 g/dL (ref 3.5–5.0)
Alkaline Phosphatase: 117 U/L (ref 38–126)
Anion gap: 5 (ref 5–15)
BUN: 15 mg/dL (ref 6–20)
CO2: 27 mmol/L (ref 22–32)
Calcium: 9.6 mg/dL (ref 8.9–10.3)
Chloride: 104 mmol/L (ref 98–111)
Creatinine: 0.78 mg/dL (ref 0.61–1.24)
GFR, Estimated: >60 mL/min (ref >60)
Glucose, Bld: 150 mg/dL — ABNORMAL HIGH (ref 70–99)
Potassium: 4.3 mmol/L (ref 3.5–5.1)
Sodium: 136 mmol/L (ref 135–145)
Total Bilirubin: 0.6 mg/dL (ref 0.3–1.2)
Total Protein: 7.6 g/dL (ref 6.5–8.1)

## 2021-03-19 LAB — MAGNESIUM: Magnesium: 2.1 mg/dL (ref 1.7–2.4)

## 2021-03-19 MED ORDER — LIDOCAINE-PRILOCAINE 2.5-2.5 % EX CREA
TOPICAL_CREAM | CUTANEOUS | 2 refills | Status: DC
  Filled 2021-03-19: qty 30, 14d supply, fill #0
  Filled 2021-05-08: qty 30, 14d supply, fill #1

## 2021-03-19 MED ORDER — DEXAMETHASONE SODIUM PHOSPHATE 10 MG/ML IJ SOLN
5.0000 mg | Freq: Once | INTRAMUSCULAR | Status: AC
  Administered 2021-03-19: 5 mg via INTRAVENOUS
  Filled 2021-03-19: qty 1

## 2021-03-19 MED ORDER — LEUCOVORIN CALCIUM INJECTION 350 MG
400.0000 mg/m2 | Freq: Once | INTRAVENOUS | Status: AC
  Administered 2021-03-19: 944 mg via INTRAVENOUS
  Filled 2021-03-19: qty 47.2

## 2021-03-19 MED ORDER — PALONOSETRON HCL INJECTION 0.25 MG/5ML
0.2500 mg | Freq: Once | INTRAVENOUS | Status: AC
  Administered 2021-03-19: 0.25 mg via INTRAVENOUS
  Filled 2021-03-19: qty 5

## 2021-03-19 MED ORDER — OXALIPLATIN CHEMO INJECTION 100 MG/20ML
50.0000 mg/m2 | Freq: Once | INTRAVENOUS | Status: AC
  Administered 2021-03-19: 120 mg via INTRAVENOUS
  Filled 2021-03-19: qty 10

## 2021-03-19 MED ORDER — DEXTROSE 5 % IV SOLN: Freq: Once | INTRAVENOUS | Status: AC

## 2021-03-19 MED ORDER — FLUOROURACIL CHEMO INJECTION 2.5 GM/50ML
400.0000 mg/m2 | Freq: Once | INTRAVENOUS | Status: AC
  Administered 2021-03-19: 950 mg via INTRAVENOUS
  Filled 2021-03-19: qty 19

## 2021-03-19 MED ORDER — SODIUM CHLORIDE 0.9 % IV SOLN
5000.0000 mg | INTRAVENOUS | Status: DC
  Administered 2021-03-19: 5000 mg via INTRAVENOUS
  Filled 2021-03-19: qty 100

## 2021-03-19 NOTE — Progress Notes (Signed)
[place] OFFICE PROGRESS NOTE   Diagnosis: Cholangiocarcinoma  INTERVAL HISTORY:   Matthew Osborne returns as scheduled.  He completed cycle 1 FOLFOX 03/05/2021.  He feels he tolerated well.  He had mild nausea.  No mouth sores.  No diarrhea.  He did not experience cold sensitivity.  Overall neuropathy symptoms seem to be better.  No bleeding.  Objective:  Vital signs in last 24 hours:  Blood pressure (!) 158/99, pulse 66, temperature 98.1 F (36.7 C), temperature source Oral, resp. rate 18, height _0  (1.854 m), weight 244 lb 9.6 oz (110.9 kg), SpO2 100 %.    HEENT: No thrush or ulcers. Resp: Lungs clear bilaterally. Cardio: Regular rate and rhythm. GI: Abdomen soft and nontender.  No hepatomegaly. Vascular: No leg edema. Skin: Palms without erythema. Port-A-Cath without erythema.   Lab Results:  Lab Results  Component Value Date   WBC 5.7 03/19/2021   HGB 11.4 (L) 03/19/2021   HCT 35.3 (L) 03/19/2021   MCV 102.0 (H) 03/19/2021   PLT 91 (L) 03/19/2021   NEUTROABS 3.5 03/19/2021    Imaging:  No results found.  Medications: I have reviewed the patient's current medications.  Assessment/Plan: Cholangiocarcinoma  multiple liver masses and abdominal lymphadenopathy CTs 01/13/2020-rounded hypodense mass appears to arise from the pancreas neck, multiple rim-enhancing masses in the liver, primarily left liver with segmental dilation of the left lobe Intermatic bile ducts, ill-defined hypodensity the central liver with effacement of the left portal vein, enlarged portacaval and retroperitoneal lymph nodes Ultrasound-guided biopsy of the left liver lesion 01/19/2020-adenocarcinoma, cytokeratin 7+, MSS, tumor mutation burden 1, IDH1 R132C MRI abdomen 01/31/2020-poorly marginated central liver mass, multiple smaller similar satellite liver masses, extrinsic mass-effect at the biliary hilum with intrahepatic biliary ductal dilatation throughout the left liver with  mild Intermatic dilatation the superior right liver, no pancreas mass or ductal dilatation, normal spleen size, numerous enlarged enhancing lymph nodes at the porta hepatis, peripancreatic, portacaval, aortocaval, left periaortic chains Cycle 1 gemcitabine/cisplatin 02/04/2020 Cycle 2 gemcitabine/cisplatin 02/28/2020 Cycle 3 gemcitabine/cisplatin 03/20/2020 CT abdomen/pelvis 03/31/2020-mild decrease in central liver tumor, mild decrease in size of liver metastases and upper abdominal adenopathy, new splenomegaly Cycle 4 gemcitabine/cisplatin 04/10/2020 Cycle 5 gemcitabine/cisplatin 05/01/2020 Cycle 6 gemcitabine/cisplatin plus durvalumab 05/22/2020 CTs 06/06/2020- dominant central liver mass mildly enlarged, other liver lesions and abdominal adenopathy is stable, no evidence of metastatic disease to the chest Cycle 7 gemcitabine/cisplatin plus Durvalumab 06/12/2020 Cycle 8 gemcitabine/cisplatin plus Durvalumab 06/30/2020 Cycle 9 gemcitabine/cisplatin plus Durvalumab 07/31/2020 CT abdomen/pelvis 08/20/2020-decreased size of dominant central liver mass, slight increase in size and stable additional liver lesions, stable portacaval node Cycle 10 gemcitabine/cisplatin plus Durvalumab 08/21/2020 Cycle 11 gemcitabine/cisplatin plus Durvalumab 09/11/2020 Cycle 12 gemcitabine/cisplatin plus Durvalumab 10/02/2020 Cycle 13 gemcitabine/cisplatin plus Durvalumab 10/23/2020 CT abdomen/pelvis 11/10/2020-no change in dominant central liver mass and additional liver lesions, stable upper retroperitoneal adenopathy Cycle 14 gemcitabine/cisplatin plus Durvalumab 11/13/2020 Cycle 15 gemcitabine/cisplatin plus Durvalumab 12/04/2020 Cycle 15 gemcitabine/cisplatin plus Durvalumab 01/08/2021 Cycle 16 gemcitabine/cisplatin plus Durvalumab 01/30/2021-cisplatin held from day 1 and day 8 secondary to neuropathy CTs 02/15/2021-slight increase in size of central liver mass, progressive metastatic lesions in the right liver, stable to slightly  decreased periportal lymph nodes, progression of splenomegaly, chronically thrombosed left portal vein Cycle 1 FOLFOX 03/05/2021 Cycle 2 FOLFOX 03/19/2021, oxaliplatin dose reduced due to thrombocytopenia   Cough-likely related to diaphragmatic irritation from #1 Anorexia/weight loss Hypercalcemia-likely hypercalcemia malignancy, status post intravenous hydration and Zometa 01/14/2020 Diabetes Hyperlipidemia Hypertension History of peripheral neuropathy secondary  to diabetes Severe back pain following Fulphila-oxycodone prescribed Thrombocytopenia secondary to chemotherapy-gemcitabine held with day 1 cycle 8 and then dose reduced COVID-19 infection 07/22/2020, 12/25/2020 Cisplatin induced peripheral neuropathy  Disposition: Matthew Osborne appears stable.  He has completed 1 cycle of FOLFOX.  He tolerated well.  Review of the CBC shows thrombocytopenia, platelet count 91,000.  Plan to proceed with treatment today as scheduled, dose reduce oxaliplatin.  He understands to contact the office with bleeding.  He will return for lab, follow-up, cycle 3 FOLFOX in 2 weeks.  He will contact the office in the interim as outlined above or with any other problems.  Plan reviewed with Dr. Benay Spice.    Ned Card ANP/GNP-BC   03/19/2021  10:12 AM

## 2021-03-19 NOTE — Progress Notes (Signed)
Patient presents for treatment. RN assessment completed along with the following:  Labs/vitals reviewed - Yes, and please see collab nurse note.    Weight within 10% of previous measurement - Yes Informed consent completed and reflects current therapy/intent - Yes, on date 03/05/2021             Provider progress note reviewed - Yes, today's provider note was reviewed. Treatment/Antibody/Supportive plan reviewed - Yes, and there are no adjustments needed for today's treatment. S&H and other orders reviewed - Yes, and there are no additional orders identified. Previous treatment date reviewed - Yes, and the appropriate amount of time has elapsed between treatments. Clinic Hand Off Received from - Yes from [facility], RN  Patient to proceed with treatment.

## 2021-03-19 NOTE — Progress Notes (Deleted)
[place] OFFICE PROGRESS NOTE   Diagnosis: Cholangiocarcinoma  INTERVAL HISTORY:   Mr. Cleland returns as scheduled.  He completed cycle 1 FOLFOX 03/05/2021.  Objective:  Vital signs in last 24 hours:  There were no vitals taken for this visit.    HEENT: *** Lymphatics: *** Resp: *** Cardio: *** GI: *** Vascular: *** Neuro: ***  Skin: ***    Lab Results:  Lab Results  Component Value Date   WBC 6.0 03/05/2021   HGB 10.9 (L) 03/05/2021   HCT 33.6 (L) 03/05/2021   MCV 103.7 (H) 03/05/2021   PLT 204 03/05/2021   NEUTROABS 3.7 03/05/2021    Imaging:  No results found.  Medications: I have reviewed the patient's current medications.  Assessment/Plan: Cholangiocarcinoma  multiple liver masses and abdominal lymphadenopathy CTs 01/13/2020-rounded hypodense mass appears to arise from the pancreas neck, multiple rim-enhancing masses in the liver, primarily left liver with segmental dilation of the left lobe Intermatic bile ducts, ill-defined hypodensity the central liver with effacement of the left portal vein, enlarged portacaval and retroperitoneal lymph nodes Ultrasound-guided biopsy of the left liver lesion 01/19/2020-adenocarcinoma, cytokeratin 7+, MSS, tumor mutation burden 1, IDH1 R132C MRI abdomen 01/31/2020-poorly marginated central liver mass, multiple smaller similar satellite liver masses, extrinsic mass-effect at the biliary hilum with intrahepatic biliary ductal dilatation throughout the left liver with mild Intermatic dilatation the superior right liver, no pancreas mass or ductal dilatation, normal spleen size, numerous enlarged enhancing lymph nodes at the porta hepatis, peripancreatic, portacaval, aortocaval, left periaortic chains Cycle 1 gemcitabine/cisplatin 02/04/2020 Cycle 2 gemcitabine/cisplatin 02/28/2020 Cycle 3 gemcitabine/cisplatin 03/20/2020 CT abdomen/pelvis 03/31/2020-mild decrease in central liver tumor, mild decrease in size of  liver metastases and upper abdominal adenopathy, new splenomegaly Cycle 4 gemcitabine/cisplatin 04/10/2020 Cycle 5 gemcitabine/cisplatin 05/01/2020 Cycle 6 gemcitabine/cisplatin plus durvalumab 05/22/2020 CTs 06/06/2020- dominant central liver mass mildly enlarged, other liver lesions and abdominal adenopathy is stable, no evidence of metastatic disease to the chest Cycle 7 gemcitabine/cisplatin plus Durvalumab 06/12/2020 Cycle 8 gemcitabine/cisplatin plus Durvalumab 06/30/2020 Cycle 9 gemcitabine/cisplatin plus Durvalumab 07/31/2020 CT abdomen/pelvis 08/20/2020-decreased size of dominant central liver mass, slight increase in size and stable additional liver lesions, stable portacaval node Cycle 10 gemcitabine/cisplatin plus Durvalumab 08/21/2020 Cycle 11 gemcitabine/cisplatin plus Durvalumab 09/11/2020 Cycle 12 gemcitabine/cisplatin plus Durvalumab 10/02/2020 Cycle 13 gemcitabine/cisplatin plus Durvalumab 10/23/2020 CT abdomen/pelvis 11/10/2020-no change in dominant central liver mass and additional liver lesions, stable upper retroperitoneal adenopathy Cycle 14 gemcitabine/cisplatin plus Durvalumab 11/13/2020 Cycle 15 gemcitabine/cisplatin plus Durvalumab 12/04/2020 Cycle 15 gemcitabine/cisplatin plus Durvalumab 01/08/2021 Cycle 16 gemcitabine/cisplatin plus Durvalumab 01/30/2021-cisplatin held from day 1 and day 8 secondary to neuropathy CTs 02/15/2021-slight increase in size of central liver mass, progressive metastatic lesions in the right liver, stable to slightly decreased periportal lymph nodes, progression of splenomegaly, chronically thrombosed left portal vein Cycle 1 FOLFOX 03/05/2021   Cough-likely related to diaphragmatic irritation from #1 Anorexia/weight loss Hypercalcemia-likely hypercalcemia malignancy, status post intravenous hydration and Zometa 01/14/2020 Diabetes Hyperlipidemia Hypertension History of peripheral neuropathy secondary to diabetes Severe back pain following  Fulphila-oxycodone prescribed Thrombocytopenia secondary to chemotherapy-gemcitabine held with day 1 cycle 8 and then dose reduced COVID-19 infection 07/22/2020, 12/25/2020 Cisplatin induced peripheral neuropathy  Disposition:    Ned Card ANP/GNP-BC   03/19/2021  9:32 AM

## 2021-03-19 NOTE — Progress Notes (Signed)
Patient seen by Ned Card NP today  Vitals are within treatment parameters.  Labs reviewed by Ned Card NP and are not all within treatment parameters. Platelet count 91,000--OK to treat.  Per physician team, patient is ready for treatment. Please note that modifications are being made to the treatment plan including Dose reduced oxaliplatin due to low platelet count.

## 2021-03-19 NOTE — Patient Instructions (Signed)
[facility]   °Discharge Instructions: °Thank you for choosing [facility] to provide your oncology and hematology care.  ° °If you have a lab appointment with the Cancer Center, please go directly to the Cancer Center and check in at the registration area. °  °Wear comfortable clothing and clothing appropriate for easy access to any Portacath or PICC line.  ° °We strive to give you quality time with your provider. You may need to reschedule your appointment if you arrive late (15 or more minutes).  Arriving late affects you and other patients whose appointments are after yours.  Also, if you miss three or more appointments without notifying the office, you may be dismissed from the clinic at the provider’s discretion.    °  °For prescription refill requests, have your pharmacy contact our office and allow 72 hours for refills to be completed.   ° °Today you received the following chemotherapy and/or immunotherapy agents Oxaliplatin (ELOXATIN), Leucovorin & Flourouracil (ADRUCIL).    °  °To help prevent nausea and vomiting after your treatment, we encourage you to take your nausea medication as directed. ° °BELOW ARE SYMPTOMS THAT SHOULD BE REPORTED IMMEDIATELY: °*FEVER GREATER THAN 100.4 F (38 °C) OR HIGHER °*CHILLS OR SWEATING °*NAUSEA AND VOMITING THAT IS NOT CONTROLLED WITH YOUR NAUSEA MEDICATION °*UNUSUAL SHORTNESS OF BREATH °*UNUSUAL BRUISING OR BLEEDING °*URINARY PROBLEMS (pain or burning when urinating, or frequent urination) °*BOWEL PROBLEMS (unusual diarrhea, constipation, pain near the anus) °TENDERNESS IN MOUTH AND THROAT WITH OR WITHOUT PRESENCE OF ULCERS (sore throat, sores in mouth, or a toothache) °UNUSUAL RASH, SWELLING OR PAIN  °UNUSUAL VAGINAL DISCHARGE OR ITCHING  ° °Items with * indicate a potential emergency and should be followed up as soon as possible or go to the Emergency Department if any problems should occur. ° °Please show the CHEMOTHERAPY ALERT  CARD or IMMUNOTHERAPY ALERT CARD at check-in to the Emergency Department and triage nurse. ° °Should you have questions after your visit or need to cancel or reschedule your appointment, please contact [facility]  Dept: [phone]  and follow the prompts.  Office hours are 8:00 a.m. to 4:30 p.m. Monday - Friday. Please note that voicemails left after 4:00 p.m. may not be returned until the following business day.  We are closed weekends and major holidays. You have access to a nurse at all times for urgent questions. Please call the main number to the clinic Dept: [phone] and follow the prompts. ° ° °For any non-urgent questions, you may also contact your provider using MyChart. We now offer e-Visits for anyone 18 and older to request care online for non-urgent symptoms. For details visit [email or web address]. °  °Also download the MyChart app! Go to the app store, search "MyChart", open the app, select [facility], and log in with your MyChart username and password. ° °Due to Covid, a mask is required upon entering the hospital/clinic. If you do not have a mask, one will be given to you upon arrival. For doctor visits, patients may have 1 support person aged 18 or older with them. For treatment visits, patients cannot have anyone with them due to current Covid guidelines and our immunocompromised population.  ° °Oxaliplatin Injection °What is this medication? °OXALIPLATIN (ox AL i PLA tin) is a chemotherapy drug. It targets fast dividing cells, like cancer cells, and causes these cells to die. This medicine is used to treat cancers of the colon and rectum, and   many other cancers. This medicine may be used for other purposes; ask your health care provider or pharmacist if you have questions. COMMON BRAND NAME(S): Eloxatin What should I tell my care team before I take this medication? They need to know if you have any of these conditions: heart disease history of irregular  heartbeat liver disease low blood counts, like white cells, platelets, or red blood cells lung or breathing disease, like asthma take medicines that treat or prevent blood clots tingling of the fingers or toes, or other nerve disorder an unusual or allergic reaction to oxaliplatin, other chemotherapy, other medicines, foods, dyes, or preservatives pregnant or trying to get pregnant breast-feeding How should I use this medication? This drug is given as an infusion into a vein. It is administered in a hospital or clinic by a specially trained health care professional. Talk to your pediatrician regarding the use of this medicine in children. Special care may be needed. Overdosage: If you think you have taken too much of this medicine contact a poison control center or emergency room at once. NOTE: This medicine is only for you. Do not share this medicine with others. What if I miss a dose? It is important not to miss a dose. Call your doctor or health care professional if you are unable to keep an appointment. What may interact with this medication? Do not take this medicine with any of the following medications: cisapride dronedarone pimozide thioridazine This medicine may also interact with the following medications: aspirin and aspirin-like medicines certain medicines that treat or prevent blood clots like warfarin, apixaban, dabigatran, and rivaroxaban cisplatin cyclosporine diuretics medicines for infection like acyclovir, adefovir, amphotericin B, bacitracin, cidofovir, foscarnet, ganciclovir, gentamicin, pentamidine, vancomycin NSAIDs, medicines for pain and inflammation, like ibuprofen or naproxen other medicines that prolong the QT interval (an abnormal heart rhythm) pamidronate zoledronic acid This list may not describe all possible interactions. Give your health care provider a list of all the medicines, herbs, non-prescription drugs, or dietary supplements you use. Also tell  them if you smoke, drink alcohol, or use illegal drugs. Some items may interact with your medicine. What should I watch for while using this medication? Your condition will be monitored carefully while you are receiving this medicine. You may need blood work done while you are taking this medicine. This medicine may make you feel generally unwell. This is not uncommon as chemotherapy can affect healthy cells as well as cancer cells. Report any side effects. Continue your course of treatment even though you feel ill unless your healthcare professional tells you to stop. This medicine can make you more sensitive to cold. Do not drink cold drinks or use ice. Cover exposed skin before coming in contact with cold temperatures or cold objects. When out in cold weather wear warm clothing and cover your mouth and nose to warm the air that goes into your lungs. Tell your doctor if you get sensitive to the cold. Do not become pregnant while taking this medicine or for 9 months after stopping it. Women should inform their health care professional if they wish to become pregnant or think they might be pregnant. Men should not father a child while taking this medicine and for 6 months after stopping it. There is potential for serious side effects to an unborn child. Talk to your health care professional for more information. Do not breast-feed a child while taking this medicine or for 3 months after stopping it. This medicine has caused ovarian failure in  some women. This medicine may make it more difficult to get pregnant. Talk to your health care professional if you are concerned about your fertility. °This medicine has caused decreased sperm counts in some men. This may make it more difficult to father a child. Talk to your health care professional if you are concerned about your fertility. °This medicine may increase your risk of getting an infection. Call your health care professional for advice if you get a fever,  chills, or sore throat, or other symptoms of a cold or flu. Do not treat yourself. Try to avoid being around people who are sick. °Avoid taking medicines that contain aspirin, acetaminophen, ibuprofen, naproxen, or ketoprofen unless instructed by your health care professional. These medicines may hide a fever. °Be careful brushing or flossing your teeth or using a toothpick because you may get an infection or bleed more easily. If you have any dental work done, tell your dentist you are receiving this medicine. °What side effects may I notice from receiving this medication? °Side effects that you should report to your doctor or health care professional as soon as possible: °allergic reactions like skin rash, itching or hives, swelling of the face, lips, or tongue °breathing problems °cough °low blood counts - this medicine may decrease the number of white blood cells, red blood cells, and platelets. You may be at increased risk for infections and bleeding °nausea, vomiting °pain, redness, or irritation at site where injected °pain, tingling, numbness in the hands or feet °signs and symptoms of bleeding such as bloody or black, tarry stools; red or dark brown urine; spitting up blood or brown material that looks like coffee grounds; red spots on the skin; unusual bruising or bleeding from the eyes, gums, or nose °signs and symptoms of a dangerous change in heartbeat or heart rhythm like chest pain; dizziness; fast, irregular heartbeat; palpitations; feeling faint or lightheaded; falls °signs and symptoms of infection like fever; chills; cough; sore throat; pain or trouble passing urine °signs and symptoms of liver injury like dark yellow or brown urine; general ill feeling or flu-like symptoms; light-colored stools; loss of appetite; nausea; right upper belly pain; unusually weak or tired; yellowing of the eyes or skin °signs and symptoms of low red blood cells or anemia such as unusually weak or tired; feeling faint  or lightheaded; falls °signs and symptoms of muscle injury like dark urine; trouble passing urine or change in the amount of urine; unusually weak or tired; muscle pain; back pain °Side effects that usually do not require medical attention (report to your doctor or health care professional if they continue or are bothersome): °changes in taste °diarrhea °gas °hair loss °loss of appetite °mouth sores °This list may not describe all possible side effects. Call your doctor for medical advice about side effects. You may report side effects to FDA at 1-800-FDA-1088. °Where should I keep my medication? °This drug is given in a hospital or clinic and will not be stored at home. °NOTE: This sheet is a summary. It may not cover all possible information. If you have questions about this medicine, talk to your doctor, pharmacist, or health care provider. °© 2022 [employer] (2020-10-10 00:00:00) ° °Leucovorin injection °What is this medication? °LEUCOVORIN (loo koe VOR in) is used to prevent or treat the harmful effects of some medicines. This medicine is used to treat anemia caused by a low amount of folic acid in the body. It is also used with 5-fluorouracil (5-FU) to treat colon   cancer. This medicine may be used for other purposes; ask your health care provider or pharmacist if you have questions. What should I tell my care team before I take this medication? They need to know if you have any of these conditions: anemia from low levels of vitamin B-12 in the blood an unusual or allergic reaction to leucovorin, folic acid, other medicines, foods, dyes, or preservatives pregnant or trying to get pregnant breast-feeding How should I use this medication? This medicine is for injection into a muscle or into a vein. It is given by a health care professional in a hospital or clinic setting. Talk to your pediatrician regarding the use of this medicine in children. Special care may be needed. Overdosage: If you  think you have taken too much of this medicine contact a poison control center or emergency room at once. NOTE: This medicine is only for you. Do not share this medicine with others. What if I miss a dose? This does not apply. What may interact with this medication? capecitabine fluorouracil phenobarbital phenytoin primidone trimethoprim-sulfamethoxazole This list may not describe all possible interactions. Give your health care provider a list of all the medicines, herbs, non-prescription drugs, or dietary supplements you use. Also tell them if you smoke, drink alcohol, or use illegal drugs. Some items may interact with your medicine. What should I watch for while using this medication? Your condition will be monitored carefully while you are receiving this medicine. This medicine may increase the side effects of 5-fluorouracil, 5-FU. Tell your doctor or health care professional if you have diarrhea or mouth sores that do not get better or that get worse. What side effects may I notice from receiving this medication? Side effects that you should report to your doctor or health care professional as soon as possible: allergic reactions like skin rash, itching or hives, swelling of the face, lips, or tongue breathing problems fever, infection mouth sores unusual bleeding or bruising unusually weak or tired Side effects that usually do not require medical attention (report to your doctor or health care professional if they continue or are bothersome): constipation or diarrhea loss of appetite nausea, vomiting This list may not describe all possible side effects. Call your doctor for medical advice about side effects. You may report side effects to FDA at 1-800-FDA-1088. Where should I keep my medication? This drug is given in a hospital or clinic and will not be stored at home. NOTE: This sheet is a summary. It may not cover all possible information. If you have questions about this  medicine, talk to your doctor, pharmacist, or health care provider.  2022 [employer]/Gold Standard (2007-07-30 00:00:00)  Fluorouracil, 5-FU injection What is this medication? FLUOROURACIL, 5-FU (flure oh YOOR a sil) is a chemotherapy drug. It slows the growth of cancer cells. This medicine is used to treat many types of cancer like breast cancer, colon or rectal cancer, pancreatic cancer, and stomach cancer. This medicine may be used for other purposes; ask your health care provider or pharmacist if you have questions. COMMON BRAND NAME(S): Adrucil What should I tell my care team before I take this medication? They need to know if you have any of these conditions: blood disorders dihydropyrimidine dehydrogenase (DPD) deficiency infection (especially a virus infection such as chickenpox, cold sores, or herpes) kidney disease liver disease malnourished, poor nutrition recent or ongoing radiation therapy an unusual or allergic reaction to fluorouracil, other chemotherapy, other medicines, foods, dyes, or preservatives pregnant or trying to get pregnant  breast-feeding How should I use this medication? This drug is given as an infusion or injection into a vein. It is administered in a hospital or clinic by a specially trained health care professional. Talk to your pediatrician regarding the use of this medicine in children. Special care may be needed. Overdosage: If you think you have taken too much of this medicine contact a poison control center or emergency room at once. NOTE: This medicine is only for you. Do not share this medicine with others. What if I miss a dose? It is important not to miss your dose. Call your doctor or health care professional if you are unable to keep an appointment. What may interact with this medication? Do not take this medicine with any of the following medications: live virus vaccines This medicine may also interact with the following medications: medicines  that treat or prevent blood clots like warfarin, enoxaparin, and dalteparin This list may not describe all possible interactions. Give your health care provider a list of all the medicines, herbs, non-prescription drugs, or dietary supplements you use. Also tell them if you smoke, drink alcohol, or use illegal drugs. Some items may interact with your medicine. What should I watch for while using this medication? Visit your doctor for checks on your progress. This drug may make you feel generally unwell. This is not uncommon, as chemotherapy can affect healthy cells as well as cancer cells. Report any side effects. Continue your course of treatment even though you feel ill unless your doctor tells you to stop. In some cases, you may be given additional medicines to help with side effects. Follow all directions for their use. Call your doctor or health care professional for advice if you get a fever, chills or sore throat, or other symptoms of a cold or flu. Do not treat yourself. This drug decreases your body's ability to fight infections. Try to avoid being around people who are sick. This medicine may increase your risk to bruise or bleed. Call your doctor or health care professional if you notice any unusual bleeding. Be careful brushing and flossing your teeth or using a toothpick because you may get an infection or bleed more easily. If you have any dental work done, tell your dentist you are receiving this medicine. Avoid taking products that contain aspirin, acetaminophen, ibuprofen, naproxen, or ketoprofen unless instructed by your doctor. These medicines may hide a fever. Do not become pregnant while taking this medicine. Women should inform their doctor if they wish to become pregnant or think they might be pregnant. There is a potential for serious side effects to an unborn child. Talk to your health care professional or pharmacist for more information. Do not breast-feed an infant while taking  this medicine. Men should inform their doctor if they wish to father a child. This medicine may lower sperm counts. Do not treat diarrhea with over the counter products. Contact your doctor if you have diarrhea that lasts more than 2 days or if it is severe and watery. This medicine can make you more sensitive to the sun. Keep out of the sun. If you cannot avoid being in the sun, wear protective clothing and use sunscreen. Do not use sun lamps or tanning beds/booths. What side effects may I notice from receiving this medication? Side effects that you should report to your doctor or health care professional as soon as possible: allergic reactions like skin rash, itching or hives, swelling of the face, lips, or tongue low blood  counts - this medicine may decrease the number of white blood cells, red blood cells and platelets. You may be at increased risk for infections and bleeding. signs of infection - fever or chills, cough, sore throat, pain or difficulty passing urine signs of decreased platelets or bleeding - bruising, pinpoint red spots on the skin, black, tarry stools, blood in the urine signs of decreased red blood cells - unusually weak or tired, fainting spells, lightheadedness breathing problems changes in vision chest pain mouth sores nausea and vomiting pain, swelling, redness at site where injected pain, tingling, numbness in the hands or feet redness, swelling, or sores on hands or feet stomach pain unusual bleeding Side effects that usually do not require medical attention (report to your doctor or health care professional if they continue or are bothersome): changes in finger or toe nails diarrhea dry or itchy skin hair loss headache loss of appetite sensitivity of eyes to the light stomach upset unusually teary eyes This list may not describe all possible side effects. Call your doctor for medical advice about side effects. You may report side effects to FDA at  1-800-FDA-1088. Where should I keep my medication? This drug is given in a hospital or clinic and will not be stored at home. NOTE: This sheet is a summary. It may not cover all possible information. If you have questions about this medicine, talk to your doctor, pharmacist, or health care provider.  2022 [employer] Standard (2020-10-10 00:00:00)  The chemotherapy medication bag should finish at 46 hours, 96 hours, or 7 days. For example, if your pump is scheduled for 46 hours and it was put on at 4:00 p.m., it should finish at 2:00 p.m. the day it is scheduled to come off regardless of your appointment time.     Estimated time to finish at 1200 p.m. on Wednesday 03/21/2021.   If the display on your pump reads "Low Volume" and it is beeping, take the batteries out of the pump and come to the cancer center for it to be taken off.   If the pump alarms go off prior to the pump reading "Low Volume" then call [phone] and someone can assist you.  If the plunger comes out and the chemotherapy medication is leaking out, please use your home chemo spill kit to clean up the spill. Do NOT use paper towels or other household products.  If you have problems or questions regarding your pump, please call either [phone] (24 hours a day) or the cancer center Monday-Friday 8:00 a.m.- 4:30 p.m. at the clinic number and we will assist you. If you are unable to get assistance, then go to the nearest Emergency Department and ask the staff to contact the IV team for assistance.

## 2021-03-19 NOTE — Patient Instructions (Signed)
Visit Information  Thank you for taking time to visit with me today. Please don't hesitate to contact me if I can be of assistance to you before our next scheduled telephone appointment.  Following are the goals we discussed today:  Attend all scheduled provider appointments Call provider office for new concerns or questions  check blood sugar at prescribed times: per Dexcom drink 6 to 8 glasses of water each day fill half of plate with vegetables keep feet up while sitting wash and dry feet carefully every day wear comfortable, cotton socks wear comfortable, well-fitting shoes Report to oncologist for any unwanted/ unmanageable side effects such as nausea, pain Avoid sick people Wash your hands well, wear a mask as needed to prevent infection Call RN care manager for any questions at [phone]  Our next appointment is by telephone on 06/01/21 at 1045 am  Please call the care guide team at [phone] if you need to cancel or reschedule your appointment.   If you are experiencing a Mental Health or [place] or need someone to talk to, please call the Suicide and Crisis Lifeline: 988 call the [place] National Suicide Prevention Lifeline: 212-821-2088 or TTY: 908-607-9503 TTY 7170194484) to talk to a trained counselor call 1-800-273-TALK (toll free, 24 hour hotline) go to [facility] [address], [place] [phone]) call 911   Patient verbalizes understanding of instructions and care plan provided today and agrees to view in [place]. Active MyChart status confirmed with patient.    Telephone follow up appointment with care management team member scheduled for:  06/01/21  Infection Prevention in the Home If you have an infection, may have been exposed to an infection, or are taking care of someone who has an infection, it is important to know how to keep the infection from spreading. Follow your health care provider's  instructions and use these guidelines to help stop the spread of infection. How infections are spread In order for an infection to spread, the following must be present: A germ. This may be a virus, bacteria, fungus, or parasite. A place for the germ to live. This may be: On or in a person, animal, plant, or food. In soil or water. On surfaces, such as a door handle. A person or animal who can develop a disease if the germ enters the body (host). The host does not have resistance to the germ. A way for the germ to enter the host. This may occur by: Direct contact with an infected person or animal. This can happen through shaking hands or hugging. Some germs can also travel through the air and spread to others. This can happen when an infected person coughs or sneezes on or near other people. Indirect contact. This occurs when the germ enters the host through contact with an infected object. Examples include: Eating or drinking food or water that has the germ (is contaminated). Touching a contaminated surface with your hands, and then touching your face, eyes, nose, or mouth. Supplies needed: Soap. Alcohol-based hand sanitizer. Standard cleaning products. Disinfectants, such as bleach. Reusable cleaning cloths, sponges, or paper towels. Disposable or reusable utility gloves. How to prevent infection from spreading There are several things that you can do to help prevent infection from spreading. Take these general actions Everyone should take the following actions to prevent the spread of infection: Wash your hands often with soap and water for at least 20 seconds. If soap and water are not available, use alcohol-based hand  sanitizer. Avoid touching your face, mouth, nose, or eyes. Cough or sneeze into a tissue, sleeve, or elbow instead of into your hand or into the air. If you cough or sneeze into a tissue, throw it away immediately and wash your hands.  Keep your bathroom  clean Provide soap. Change towels and washcloths frequently. Change toothbrushes often and store them separately in a clean, dry place. Clean and disinfect all surfaces, including the toilet, floor, tub, shower, and sink. Do not share personal items, such as razors, toothbrushes, deodorant, combs, brushes, towels, and washcloths. Maintain hygiene in the [facility] your hands before and after preparing food and before you eat. Clean the inside of your refrigerator each week. Keep your refrigerator set at 87F (4C) or less, and set your freezer at 15F (-18C) or less. Keep work surfaces clean. Disinfect them regularly. Wash your dishes in hot, soapy water. Air-dry your dishes or use a dishwasher. Do not share dishes or eating utensils. Handle food safely Store food carefully. Refrigerate leftovers promptly in covered containers. Throw out stale or spoiled food. Thaw foods in the refrigerator or microwave, not at room temperature. Serve foods at the proper temperature. Do not eat raw meat. Make sure it is cooked to the appropriate temperature. Cook eggs until they are firm. Wash fruits and vegetables under running water. Use separate cutting boards, plates, and utensils for raw foods and cooked foods. Use a clean spoon each time you sample food while cooking. Do laundry the right way Wear gloves if laundry is visibly soiled. Do not shake soiled laundry. Doing that may send germs into the air. Wash laundry in hot water. If you cannot wash the laundry right away, place it in a plastic bag and wash it as soon as possible. Be careful around animals and pets Wash your hands before and after touching animals. If you have a pet, ensure that your pet stays clean. Do not let people with weak immune systems touch bird droppings, fish tank water, or a litter box. If you have a pet cage or litter box, be sure to clean it every day. If you are sick, stay away from animals and have someone else  care for them if possible. How to clean and disinfect objects and surfaces Precautions Some disinfectants work for certain germs and not others. Read the manufacturer's instructions or read online resources to determine if the product you are using will work for the germ you are trying to remove. If you choose to use bleach, use it safely. Never mix it with other cleaning products, especially those that contain ammonia. This mixture can create a dangerous gas that may be deadly. Keep proper movement of fresh air in your home (ventilation). Pour used mop water down the utility sink or toilet. Do not pour this water down the kitchen sink. Objects and surfaces  If surfaces are visibly soiled, clean them first with soap and water before disinfecting. Disinfect surfaces that are frequently touched every day. This may include: Counters. Tables. Doorknobs. Sinks and faucets. Electronics, such as: Architectural technologist. Remote controls. Keyboards. Computers and tablets. Cleaning supplies Some cleaning supplies can breed germs. Take good care of them to prevent germs from spreading. To do this: Soak toilet brushes, mops, and sponges in bleach and water for 5 minutes after each use, or according to manufacturer's instructions. Wash reusable cleaning cloths and sanitize sponges after each use. Throw away disposable gloves after one use. Replace reusable utility gloves if they are cracked or  torn or if they start to peel. Additional actions if you are sick If you live with other people:  Avoid close contact with those around you. Stay at least 3 ft (1 m) away from others, if possible. Use a separate bathroom, if possible. If possible, sleep in a separate bedroom or in a separate bed to prevent infecting other household members. Change bedroom linens each week or whenever they are soiled. Have everyone in the household wash hands often with soap and water. If soap and water are not available, use alcohol-based  hand sanitizer. In general: Stay home except to get medical care. Call ahead before visiting your health care provider. Ask others to get groceries and household supplies and to refill prescriptions for you. Avoid public areas. Try not to take public transportation. If you can, wear a mask if you need to go out of the house, or if you are in close contact with someone who is not sick. Avoid visitors until you have completely recovered, or until you have no signs and symptoms of infection. Avoid preparing food or providing care for others. If you must prepare food or provide care for others, wear a mask and wash your hands before and after doing these things. Where to find more information Centers for Disease Control and Prevention: PromotionalReview.nl World Health Organization [facility]): PrintStats.tn Association for Professionals in Infection Control and Epidemiology: professionals.site.StadiumBlog.se Summary It is important to know how to keep the infection from spreading. Make sure everyone in your household washes their hands often with soap and water. Disinfect surfaces that are frequently touched every day. If you are sick, stay home except to get medical care. This information is not intended to replace advice given to you by your health care provider. Make sure you discuss any questions you have with your health care provider. Document Revised: 03/29/2019 Document Reviewed: 04/17/2018 Elsevier Patient Education  2022 [place]   Jacqlyn Larsen [facility], BSN RN Case Manager [facility] [phone]

## 2021-03-19 NOTE — Chronic Care Management (AMB) (Signed)
Care Management    RN Visit Note  03/19/2021 Name: Matthew Osborne MRN: [id number] DOB: 1975-10-18  Subjective: Matthew Osborne is a 46 y.o. year old male who is a primary care patient of Pickard, Priscille Heidelberg, MD. The care management team was consulted for assistance with disease management and care coordination needs.    Engaged with patient by telephone for follow up visit in response to provider referral for case management and/or care coordination services.   Consent to Services:   Matthew Osborne was given information about Care Management services today including:  Care Management services includes personalized support from designated clinical staff supervised by his physician, including individualized plan of care and coordination with other care providers 24/7 contact phone numbers for assistance for urgent and routine care needs. The patient may stop case management services at any time by phone call to the office staff.  Patient agreed to services and consent obtained.   Assessment: Review of patient past medical history, allergies, medications, health status, including review of consultants reports, laboratory and other test data, was performed as part of comprehensive evaluation and provision of chronic care management services.   SDOH (Social Determinants of Health) assessments and interventions performed:    Care Plan  Allergies  Allergen Reactions   Niaspan [Niacin] Other (See Comments)    Flushing     Outpatient Encounter Medications as of 03/19/2021  Medication Sig Note   bisoprolol-hydrochlorothiazide (ZIAC) 10-6.25 MG tablet TAKE 1 TABLET BY MOUTH EVERY DAY IN THE MORNING    blood glucose meter kit and supplies KIT Fasting prior to each meal three times a day    Continuous Blood Gluc Sensor (DEXCOM G6 SENSOR) MISC Use as directed to check blood sugar 5-6 times daily.    Continuous Blood Gluc Transmit (DEXCOM G6 TRANSMITTER) MISC USE AS DIRECTED TO CHECK BLOOD SUGAR 5-6  TIMES DAILY    glucose blood test strip Dispense based on patient and insurance preference.  Use twice daily as directed. (FOR ICD-10 E11.65)    ibuprofen (ADVIL) 200 MG tablet Take 800 mg by mouth every 4 (four) hours as needed for fever.    insulin glargine-yfgn (SEMGLEE, YFGN,) 100 UNIT/ML Pen Inject 70 Units into the skin 2 (two) times daily. (Patient taking differently: Inject 60-90 Units into the skin See admin instructions. Inject 90 units subcutaneously twice daily on chemo day and for 3 days therafter; inject 60 units twice daily on non-chemo days) 12/25/2020: 60 units this morning   insulin lispro (HUMALOG KWIKPEN) 100 UNIT/ML KwikPen CHECK BG PRIOR TO MEALS, IF BG IS 150-200 inject 2U, BG 201-250 inject 4U, 251-300 inject 6U, 301-350 inject 8U, 351-400 inject 10U. OVER 401 inject 12U & CALL MD    Insulin Pen Needle (UNIFINE PENTIPS) 31G X 5 MM MISC USE 4 PER DAY TO INJECT HUMALOG KWIKPEN & BASAGLAR.    Insulin Syringe 27G X 1/2" 0.5 ML MISC Use as directed to inject insulin SQ Q1D.    Lancets Thin MISC Check BS BID    lidocaine-prilocaine (EMLA) cream APPLY 1 APPLICATION TOPICALLY AS DIRECTED. APPLY TO PORT SITE 1-2 HOURS PRIOR TO STICK AND COVER WITH PLASTIC WRAP.    LORazepam (ATIVAN) 0.5 MG tablet Take 1 tablet (0.5 mg total) by mouth at bedtime as needed for anxiety. (Patient taking differently: Take 0.5 mg by mouth at bedtime as needed (anxiety/agitation after chemo treatments).)    ondansetron (ZOFRAN-ODT) 8 MG disintegrating tablet Take 1 tablet (8 mg total) by mouth every  8 (eight) hours as needed for nausea or vomiting.    prochlorperazine (COMPAZINE) 10 MG tablet TAKE 1 TABLET BY MOUTH EVERY 6 HOURS AS NEEDED FOR NAUSEA    traMADol (ULTRAM) 50 MG tablet TAKE 1 TABLET BY MOUTH EVERY 6 HOURS AS NEEDED FOR MODERATE PAIN (COUGH).    TRUEPLUS LANCETS 33G MISC 1 each by Does not apply route 2 (two) times daily.    [DISCONTINUED] lidocaine-prilocaine (EMLA) cream APPLY 1 APPLICATION  TOPICALLY AS DIRECTED. APPLY TO PORT SITE 1-2 HOURS PRIOR TO STICK AND COVER WITH PLASTIC WRAP. (Patient taking differently: 1 application See admin instructions. Apply topically to port site 1-2 hours prior to access - cover with plastic wrap)    Facility-Administered Encounter Medications as of 03/19/2021  Medication   [COMPLETED] dexamethasone (DECADRON) injection 5 mg   [COMPLETED] dextrose 5 % solution   fluorouracil (ADRUCIL) 5,000 mg in sodium chloride 0.9 % 150 mL chemo infusion   fluorouracil (ADRUCIL) chemo injection 950 mg   leucovorin 944 mg in dextrose 5 % 250 mL infusion   oxaliplatin (ELOXATIN) 120 mg in dextrose 5 % 500 mL chemo infusion   [COMPLETED] palonosetron (ALOXI) injection 0.25 mg   sodium chloride flush (NS) 0.9 % injection 10 mL    Patient Active Problem List   Diagnosis Date Noted   COVID 12/25/2020   COVID-19 virus infection 12/25/2020   Genetic testing 03/24/2020   Family history of breast cancer    Family history of Hodgkin's lymphoma    Cholangiocarcinoma ([place]) 02/01/2020   Goals of care, counseling/discussion 01/21/2020   Pancreas cancer ([place]) 01/21/2020   Hypercalcemia 01/14/2020   Left elbow pain 05/19/2019   Numbness 05/19/2019   Nonproliferative retinopathy due to secondary diabetes ([place])    Allergic rhinitis 06/17/2014   Sinus congestion 05/27/2014   Chronic cough 02/14/2014   Essential hypertension, benign 06/30/2013   Hyperlipidemia 06/30/2013   Diabetes ([place]) 06/30/2013    Conditions to be addressed/monitored: DMII and Cancer  Care Plan : RN Care Manager Plan of Care  Updates made by Kassie Mends, RN since 03/19/2021 12:00 AM     Problem: No plan of care established for management of chronic disease states  (Diabetes, Cancer)   Priority: High     Long-Range Goal: Development of plan of care for chronic disease management  (Diabetes, Cancer)   Start Date: 01/05/2021  Expected End Date: 07/04/2021  Priority: High  Note:    Current Barriers:  Knowledge Deficits related to plan of care for management of DMII and Cancer  - Spoke with patient and permission also given to speak with wife [name] if needed.  Patient lives with spouse and children, reports is overall independent with ADL, IADL's, continues to drive, is currently undergoing chemotherapy managed by Dr. Benay Spice with treatments every other Monday for cholangiocarcinoma/ pancreatic cancer.  Patient has dexcom for blood sugar monitoring with fasting readings 77-250, pt reports when he receives chemotherapy and for 4 days afterward his blood sugar can be elevated due to steroids.  Normally (not with chemotherapy) blood sugars are manageable, pt, wife verbalizes understanding of insulin regime/ sliding scale. Pt denies any pain or nausea, reports "appetite is wonderful and has gained weight"  No new concerns reported today.  RNCM Clinical Goal(s):  Patient will verbalize understanding of plan for management of DMII and Cancer as evidenced by patient/ spouse report, review EHR and  through collaboration with RN Care manager, provider, and care team.   Interventions: 1:1 collaboration with primary  care provider regarding development and update of comprehensive plan of care as evidenced by provider attestation and co-signature Inter-disciplinary care team collaboration (see longitudinal plan of care) Evaluation of current treatment plan related to  self management and patient's adherence to plan as established by provider  Diabetes Interventions:  (Status:  New goal.) Long Term Goal Assessed patient's understanding of A1c goal: <7% Reviewed medications with patient and discussed importance of medication adherence Review of patient status, including review of consultants reports, relevant laboratory and other test results, and medications completed Reinforced carbohydrate modified diet Lab Results  Component Value Date   HGBA1C 7.6 (H) 12/25/2020  Oncology:  (Status:  New goal.) Long Term Goal Assessment of understanding of oncology diagnosis:  Assessed patient understanding of cancer diagnosis and recommended treatment plan, Reviewed upcoming provider appointments and treatment appointments, Assessed available transportation to appointments and treatments. Has consistent/reliable transportation: Yes, and Assessed support system. Has consistent/reliable family or other support: Yes Reinforced importance of good handwashing, wearing a mask as needed and avoiding sick people Reviewed reporting to oncologist any unwanted side effects such as nausea, pain, etc Pain assessment completed  Patient Goals/Self-Care Activities: Attend all scheduled provider appointments Call provider office for new concerns or questions  check blood sugar at prescribed times: per Dexcom drink 6 to 8 glasses of water each day fill half of plate with vegetables keep feet up while sitting wash and dry feet carefully every day wear comfortable, cotton socks wear comfortable, well-fitting shoes Report to oncologist for any unwanted/ unmanageable side effects such as nausea, pain Avoid sick people Wash your hands well, wear a mask as needed to prevent infection Call RN care manager for any questions at [phone]         Plan: Telephone follow up appointment with care management team member scheduled for:  06/01/21  Jacqlyn Larsen [facility], BSN RN Case Manager [facility] [phone]

## 2021-03-21 ENCOUNTER — Other Ambulatory Visit: Payer: Self-pay

## 2021-03-21 ENCOUNTER — Inpatient Hospital Stay: Payer: No Typology Code available for payment source

## 2021-03-21 VITALS — BP 133/92 | HR 70 | Temp 98.3°F | Resp 20

## 2021-03-21 DIAGNOSIS — C221 Intrahepatic bile duct carcinoma: Secondary | ICD-10-CM

## 2021-03-21 DIAGNOSIS — Z5111 Encounter for antineoplastic chemotherapy: Secondary | ICD-10-CM | POA: Diagnosis not present

## 2021-03-21 MED ORDER — HEPARIN SOD (PORK) LOCK FLUSH 100 UNIT/ML IV SOLN
500.0000 [IU] | Freq: Once | INTRAVENOUS | Status: AC | PRN
  Administered 2021-03-21: 500 [IU]

## 2021-03-21 MED ORDER — SODIUM CHLORIDE 0.9% FLUSH
10.0000 mL | INTRAVENOUS | Status: DC | PRN
  Administered 2021-03-21: 10 mL

## 2021-03-21 NOTE — Patient Instructions (Signed)
Heparin injection What is this medication? HEPARIN (HEP a rin) is an anticoagulant. It is used to treat or prevent clots in the veins, arteries, lungs, or heart. It stops clots from forming or getting bigger. This medicine prevents clotting during open-heart surgery, dialysis, or in patients who are confined to bed. This medicine may be used for other purposes; ask your health care provider or pharmacist if you have questions. COMMON BRAND NAME(S): Hep-Lock, Hep-Lock U/P, Hepflush-10, Monoject Prefill Advanced Heparin Lock Flush, SASH Normal Saline and Heparin What should I tell my care team before I take this medication? They need to know if you have any of these conditions: bleeding disorders, such as hemophilia or low blood platelets bowel disease or diverticulitis endocarditis high blood pressure liver disease recent surgery or delivery of a baby stomach ulcers an unusual or allergic reaction to heparin, benzyl alcohol, sulfites, other medicines, foods, dyes, or preservatives pregnant or trying to get pregnant breast-feeding How should I use this medication? This medicine is given by injection or infusion into a vein. It can also be given by injection of small amounts under the skin. It is usually given by a health care professional in a hospital or clinic setting. If you get this medicine at home, you will be taught how to prepare and give this medicine. Use exactly as directed. Take your medicine at regular intervals. Do not take it more often than directed. Do not stop taking except on your doctor's advice. Stopping this medicine may increase your risk of a blot clot. Be sure to refill your prescription before you run out of medicine. It is important that you put your used needles and syringes in a special sharps container. Do not put them in a trash can. If you do not have a sharps container, call your pharmacist or healthcare provider to get one. Talk to your pediatrician regarding the  use of this medicine in children. While this medicine may be prescribed for children for selected conditions, precautions do apply. Overdosage: If you think you have taken too much of this medicine contact a poison control center or emergency room at once. NOTE: This medicine is only for you. Do not share this medicine with others. What if I miss a dose? If you miss a dose, take it as soon as you can. If it is almost time for your next dose, take only that dose. Do not take double or extra doses. What may interact with this medication? Do not take this medicine with any of the following medications: aspirin and aspirin-like drugs mifepristone medicines that treat or prevent blood clots like warfarin, enoxaparin, and dalteparin palifermin protamine This medicine may also interact with the following medications: dextran digoxin hydroxychloroquine medicines for treating colds or allergies nicotine NSAIDs, medicines for pain and inflammation, like ibuprofen or naproxen phenylbutazone tetracycline antibiotics This list may not describe all possible interactions. Give your health care provider a list of all the medicines, herbs, non-prescription drugs, or dietary supplements you use. Also tell them if you smoke, drink alcohol, or use illegal drugs. Some items may interact with your medicine. What should I watch for while using this medication? Visit your healthcare professional for regular checks on your progress. You may need blood work done while you are taking this medicine. Your condition will be monitored carefully while you are receiving this medicine. It is important not to miss any appointments. Wear a medical ID bracelet or chain, and carry a card that describes your disease and details  of your medicine and dosage times. Notify your doctor or healthcare professional at once if you have cold, blue hands or feet. If you are going to need surgery or other procedure, tell your healthcare  professional that you are using this medicine. Avoid sports and activities that might cause injury while you are using this medicine. Severe falls or injuries can cause unseen bleeding. Be careful when using sharp tools or knives. Consider using an Copy. Take special care brushing or flossing your teeth. Report any injuries, bruising, or red spots on the skin to your healthcare professional. Using this medicine for a long time may weaken your bones and increase the risk of bone fractures. You should make sure that you get enough calcium and vitamin D while you are taking this medicine. Discuss the foods you eat and the vitamins you take with your healthcare professional. Wear a medical ID bracelet or chain. Carry a card that describes your disease and details of your medicine and dosage times. What side effects may I notice from receiving this medication? Side effects that you should report to your doctor or health care professional as soon as possible: allergic reactions like skin rash, itching or hives, swelling of the face, lips, or tongue bone pain fever, chills nausea, vomiting signs and symptoms of bleeding such as bloody or black, tarry stools; red or dark-brown urine; spitting up blood or brown material that looks like coffee grounds; red spots on the skin; unusual bruising or bleeding from the eye, gums, or nose signs and symptoms of a blood clot such as chest pain; shortness of breath; pain, swelling, or warmth in the leg signs and symptoms of a stroke such as changes in vision; confusion; trouble speaking or understanding; severe headaches; sudden numbness or weakness of the face, arm or leg; trouble walking; dizziness; loss of coordination Side effects that usually do not require medical attention (report to your doctor or health care professional if they continue or are bothersome): hair loss pain, redness, or irritation at site where injected This list may not describe all  possible side effects. Call your doctor for medical advice about side effects. You may report side effects to FDA at 1-800-FDA-1088. Where should I keep my medication? Keep out of the reach of children. Store unopened vials at room temperature between 15 and 30 degrees C (59 and 86 degrees F). Do not freeze. Do not use if solution is discolored or particulate matter is present. Throw away any unused medicine after the expiration date. NOTE: This sheet is a summary. It may not cover all possible information. If you have questions about this medicine, talk to your doctor, pharmacist, or health care provider.  2022 [employer]/Gold Standard (2020-03-02 00:00:00)

## 2021-03-22 ENCOUNTER — Encounter: Payer: Self-pay | Admitting: *Deleted

## 2021-03-22 NOTE — Progress Notes (Signed)
Faxed 03/18/21 labs, office note and next appointment to [facility] att: Wilford Sports, LPN.  Fax [phone]

## 2021-03-23 ENCOUNTER — Encounter: Payer: Self-pay | Admitting: Family Medicine

## 2021-03-26 ENCOUNTER — Other Ambulatory Visit (HOSPITAL_BASED_OUTPATIENT_CLINIC_OR_DEPARTMENT_OTHER): Payer: Self-pay

## 2021-04-01 ENCOUNTER — Other Ambulatory Visit: Payer: Self-pay | Admitting: Oncology

## 2021-04-02 ENCOUNTER — Other Ambulatory Visit: Payer: Self-pay

## 2021-04-02 ENCOUNTER — Encounter: Payer: Self-pay | Admitting: *Deleted

## 2021-04-02 ENCOUNTER — Inpatient Hospital Stay: Payer: No Typology Code available for payment source

## 2021-04-02 ENCOUNTER — Other Ambulatory Visit: Payer: Self-pay | Admitting: Cardiology

## 2021-04-02 ENCOUNTER — Other Ambulatory Visit: Payer: Self-pay | Admitting: Family Medicine

## 2021-04-02 ENCOUNTER — Inpatient Hospital Stay: Payer: No Typology Code available for payment source | Admitting: Oncology

## 2021-04-02 ENCOUNTER — Other Ambulatory Visit (HOSPITAL_BASED_OUTPATIENT_CLINIC_OR_DEPARTMENT_OTHER): Payer: Self-pay

## 2021-04-02 VITALS — BP 143/88 | HR 64 | Temp 98.1°F | Resp 20 | Ht 73.0 in | Wt 242.0 lb

## 2021-04-02 DIAGNOSIS — C221 Intrahepatic bile duct carcinoma: Secondary | ICD-10-CM | POA: Diagnosis not present

## 2021-04-02 DIAGNOSIS — Z5111 Encounter for antineoplastic chemotherapy: Secondary | ICD-10-CM | POA: Diagnosis not present

## 2021-04-02 LAB — CBC WITH DIFFERENTIAL (CANCER CENTER ONLY)
Abs Immature Granulocytes: 0 10*3/uL (ref 0.00–0.07)
Basophils Absolute: 0 10*3/uL (ref 0.0–0.1)
Basophils Relative: 1 %
Eosinophils Absolute: 0.3 10*3/uL (ref 0.0–0.5)
Eosinophils Relative: 7 %
HCT: 36.8 % — ABNORMAL LOW (ref 39.0–52.0)
Hemoglobin: 11.8 g/dL — ABNORMAL LOW (ref 13.0–17.0)
Immature Granulocytes: 0 %
Lymphocytes Relative: 22 %
Lymphs Abs: 0.9 10*3/uL (ref 0.7–4.0)
MCH: 32.1 pg (ref 26.0–34.0)
MCHC: 32.1 g/dL (ref 30.0–36.0)
MCV: 100 fL (ref 80.0–100.0)
Monocytes Absolute: 0.6 10*3/uL (ref 0.1–1.0)
Monocytes Relative: 13 %
Neutro Abs: 2.4 10*3/uL (ref 1.7–7.7)
Neutrophils Relative %: 57 %
Platelet Count: 103 10*3/uL — ABNORMAL LOW (ref 150–400)
RBC: 3.68 MIL/uL — ABNORMAL LOW (ref 4.22–5.81)
RDW: 15.4 % (ref 11.5–15.5)
WBC Count: 4.2 10*3/uL (ref 4.0–10.5)
nRBC: 0 % (ref 0.0–0.2)

## 2021-04-02 LAB — CMP (CANCER CENTER ONLY)
ALT: 24 U/L (ref 0–44)
AST: 61 U/L — ABNORMAL HIGH (ref 15–41)
Albumin: 3.5 g/dL (ref 3.5–5.0)
Alkaline Phosphatase: 95 U/L (ref 38–126)
Anion gap: 6 (ref 5–15)
BUN: 20 mg/dL (ref 6–20)
CO2: 26 mmol/L (ref 22–32)
Calcium: 9.6 mg/dL (ref 8.9–10.3)
Chloride: 104 mmol/L (ref 98–111)
Creatinine: 0.85 mg/dL (ref 0.61–1.24)
GFR, Estimated: >60 mL/min (ref >60)
Glucose, Bld: 141 mg/dL — ABNORMAL HIGH (ref 70–99)
Potassium: 4.1 mmol/L (ref 3.5–5.1)
Sodium: 136 mmol/L (ref 135–145)
Total Bilirubin: 0.6 mg/dL (ref 0.3–1.2)
Total Protein: 7.5 g/dL (ref 6.5–8.1)

## 2021-04-02 LAB — MAGNESIUM: Magnesium: 2 mg/dL (ref 1.7–2.4)

## 2021-04-02 MED ORDER — OXALIPLATIN CHEMO INJECTION 100 MG/20ML
50.0000 mg/m2 | Freq: Once | INTRAVENOUS | Status: AC
  Administered 2021-04-02: 120 mg via INTRAVENOUS
  Filled 2021-04-02: qty 20

## 2021-04-02 MED ORDER — BISOPROLOL-HYDROCHLOROTHIAZIDE 10-6.25 MG PO TABS
ORAL_TABLET | ORAL | 0 refills | Status: DC
  Filled 2021-04-02: qty 30, 30d supply, fill #0

## 2021-04-02 MED ORDER — INSULIN LISPRO (1 UNIT DIAL) 100 UNIT/ML (KWIKPEN)
PEN_INJECTOR | SUBCUTANEOUS | 0 refills | Status: DC
  Filled 2021-04-02: qty 15, 41d supply, fill #0

## 2021-04-02 MED ORDER — LEUCOVORIN CALCIUM INJECTION 350 MG
400.0000 mg/m2 | Freq: Once | INTRAVENOUS | Status: AC
  Administered 2021-04-02: 944 mg via INTRAVENOUS
  Filled 2021-04-02: qty 47.2

## 2021-04-02 MED ORDER — FLUOROURACIL CHEMO INJECTION 2.5 GM/50ML
400.0000 mg/m2 | Freq: Once | INTRAVENOUS | Status: AC
  Administered 2021-04-02: 950 mg via INTRAVENOUS
  Filled 2021-04-02: qty 19

## 2021-04-02 MED ORDER — SODIUM CHLORIDE 0.9 % IV SOLN
5000.0000 mg | INTRAVENOUS | Status: DC
  Administered 2021-04-02: 5000 mg via INTRAVENOUS
  Filled 2021-04-02: qty 100

## 2021-04-02 MED ORDER — DEXTROSE 5 % IV SOLN: Freq: Once | INTRAVENOUS | Status: AC

## 2021-04-02 MED ORDER — DEXAMETHASONE SODIUM PHOSPHATE 10 MG/ML IJ SOLN
5.0000 mg | Freq: Once | INTRAMUSCULAR | Status: AC
  Administered 2021-04-02: 5 mg via INTRAVENOUS
  Filled 2021-04-02: qty 1

## 2021-04-02 MED ORDER — PALONOSETRON HCL INJECTION 0.25 MG/5ML
0.2500 mg | Freq: Once | INTRAVENOUS | Status: AC
  Administered 2021-04-02: 0.25 mg via INTRAVENOUS
  Filled 2021-04-02: qty 5

## 2021-04-02 NOTE — Progress Notes (Signed)
[facility] OFFICE PROGRESS NOTE   Diagnosis: Cholangiocarcinoma  INTERVAL HISTORY:   Mr. Chamberland completed another cycle of FOLFOX on 03/19/2021.  No nausea/vomiting.  He reports 1 gum ulcer.  This has resolved.  No change in the intermittent baseline peripheral numbness.  His ankles feel cold.  Good appetite.  Objective:  Vital signs in last 24 hours:  Blood pressure (!) 143/88, pulse 64, temperature 98.1 F (36.7 C), temperature source Oral, resp. rate 20, height 6' 1"  (1.854 m), weight 242 lb (109.8 kg), SpO2 98 %.    HEENT: No thrush or ulcers Resp: Lungs clear bilaterally Cardio: Regular rate and rhythm GI: No hepatosplenomegaly, no mass, nontender Vascular: No leg edema  Portacath/PICC-without erythema  Lab Results:  Lab Results  Component Value Date   WBC 4.2 04/02/2021   HGB 11.8 (L) 04/02/2021   HCT 36.8 (L) 04/02/2021   MCV 100.0 04/02/2021   PLT 103 (L) 04/02/2021   NEUTROABS 2.4 04/02/2021    CMP  Lab Results  Component Value Date   NA 136 04/02/2021   K 4.1 04/02/2021   CL 104 04/02/2021   CO2 26 04/02/2021   GLUCOSE 141 (H) 04/02/2021   BUN 20 04/02/2021   CREATININE 0.85 04/02/2021   CALCIUM 9.6 04/02/2021   PROT 7.5 04/02/2021   ALBUMIN 3.5 04/02/2021   AST 61 (H) 04/02/2021   ALT 24 04/02/2021   ALKPHOS 95 04/02/2021   BILITOT 0.6 04/02/2021   GFRNONAA >60 04/02/2021   GFRAA 127 01/11/2020    Lab Results  Component Value Date   CEA1 2.15 01/14/2020   WHQ759 82 (H) 08/21/2020     Medications: I have reviewed the patient's current medications.   Assessment/Plan:  Cholangiocarcinoma  multiple liver masses and abdominal lymphadenopathy CTs 01/13/2020-rounded hypodense mass appears to arise from the pancreas neck, multiple rim-enhancing masses in the liver, primarily left liver with segmental dilation of the left lobe Intermatic bile ducts, ill-defined hypodensity the central liver with effacement of the left portal  vein, enlarged portacaval and retroperitoneal lymph nodes Ultrasound-guided biopsy of the left liver lesion 01/19/2020-adenocarcinoma, cytokeratin 7+, MSS, tumor mutation burden 1, IDH1 R132C MRI abdomen 01/31/2020-poorly marginated central liver mass, multiple smaller similar satellite liver masses, extrinsic mass-effect at the biliary hilum with intrahepatic biliary ductal dilatation throughout the left liver with mild Intermatic dilatation the superior right liver, no pancreas mass or ductal dilatation, normal spleen size, numerous enlarged enhancing lymph nodes at the porta hepatis, peripancreatic, portacaval, aortocaval, left periaortic chains Cycle 1 gemcitabine/cisplatin 02/04/2020 Cycle 2 gemcitabine/cisplatin 02/28/2020 Cycle 3 gemcitabine/cisplatin 03/20/2020 CT abdomen/pelvis 03/31/2020-mild decrease in central liver tumor, mild decrease in size of liver metastases and upper abdominal adenopathy, new splenomegaly Cycle 4 gemcitabine/cisplatin 04/10/2020 Cycle 5 gemcitabine/cisplatin 05/01/2020 Cycle 6 gemcitabine/cisplatin plus durvalumab 05/22/2020 CTs 06/06/2020- dominant central liver mass mildly enlarged, other liver lesions and abdominal adenopathy is stable, no evidence of metastatic disease to the chest Cycle 7 gemcitabine/cisplatin plus Durvalumab 06/12/2020 Cycle 8 gemcitabine/cisplatin plus Durvalumab 06/30/2020 Cycle 9 gemcitabine/cisplatin plus Durvalumab 07/31/2020 CT abdomen/pelvis 08/20/2020-decreased size of dominant central liver mass, slight increase in size and stable additional liver lesions, stable portacaval node Cycle 10 gemcitabine/cisplatin plus Durvalumab 08/21/2020 Cycle 11 gemcitabine/cisplatin plus Durvalumab 09/11/2020 Cycle 12 gemcitabine/cisplatin plus Durvalumab 10/02/2020 Cycle 13 gemcitabine/cisplatin plus Durvalumab 10/23/2020 CT abdomen/pelvis 11/10/2020-no change in dominant central liver mass and additional liver lesions, stable upper retroperitoneal adenopathy Cycle  14 gemcitabine/cisplatin plus Durvalumab 11/13/2020 Cycle 15 gemcitabine/cisplatin plus Durvalumab 12/04/2020 Cycle 15 gemcitabine/cisplatin plus Durvalumab  01/08/2021 Cycle 16 gemcitabine/cisplatin plus Durvalumab 01/30/2021-cisplatin held from day 1 and day 8 secondary to neuropathy CTs 02/15/2021-slight increase in size of central liver mass, progressive metastatic lesions in the right liver, stable to slightly decreased periportal lymph nodes, progression of splenomegaly, chronically thrombosed left portal vein Cycle 1 FOLFOX 03/05/2021 Cycle 2 FOLFOX 03/19/2021, oxaliplatin dose reduced due to thrombocytopenia Cycle 3 FOLFOX 04/02/2021   Cough-likely related to diaphragmatic irritation from #1 Anorexia/weight loss Hypercalcemia-likely hypercalcemia malignancy, status post intravenous hydration and Zometa 01/14/2020 Diabetes Hyperlipidemia Hypertension History of peripheral neuropathy secondary to diabetes Severe back pain following Fulphila-oxycodone prescribed Thrombocytopenia secondary to chemotherapy-gemcitabine held with day 1 cycle 8 and then dose reduced COVID-19 infection 07/22/2020, 12/25/2020 Cisplatin induced peripheral neuropathy   Disposition: Mr. Priola appears stable.  He is tolerating the FOLFOX well.  He will complete cycle 3 today.  He will return for an office visit and cycle 4 FOLFOX in 2 weeks.  The plan is to refer him for restaging CTs after cycle 5.  Betsy Coder, MD  04/02/2021  10:45 AM

## 2021-04-02 NOTE — Progress Notes (Signed)
Patient presents for treatment. RN assessment completed along with the following:  Labs/vitals reviewed - Yes, and please see collab nurse note    Weight within 10% of previous measurement - Yes Informed consent completed and reflects current therapy/intent - Yes, on date 03/05/2021             Provider progress note reviewed - Yes, today's provider note was reviewed. Treatment/Antibody/Supportive plan reviewed - Yes, and there are no adjustments needed for today's treatment. S&H and other orders reviewed - Yes, and there are no additional orders identified. Previous treatment date reviewed - Yes, and the appropriate amount of time has elapsed between treatments. Clinic Hand Off Received from - Yes from [facility], RN  Patient to proceed with treatment.

## 2021-04-02 NOTE — Progress Notes (Signed)
Patient seen by Dr. Sherrill today ? ?Vitals are within treatment parameters. ? ?Labs reviewed by Dr. Sherrill and are within treatment parameters. ? ?Per physician team, patient is ready for treatment and there are NO modifications to the treatment plan.  ?

## 2021-04-02 NOTE — Patient Instructions (Signed)
[facility]   °Discharge Instructions: °Thank you for choosing [facility] to provide your oncology and hematology care.  ° °If you have a lab appointment with the Cancer Center, please go directly to the Cancer Center and check in at the registration area. °  °Wear comfortable clothing and clothing appropriate for easy access to any Portacath or PICC line.  ° °We strive to give you quality time with your provider. You may need to reschedule your appointment if you arrive late (15 or more minutes).  Arriving late affects you and other patients whose appointments are after yours.  Also, if you miss three or more appointments without notifying the office, you may be dismissed from the clinic at the provider’s discretion.    °  °For prescription refill requests, have your pharmacy contact our office and allow 72 hours for refills to be completed.   ° °Today you received the following chemotherapy and/or immunotherapy agents Oxaliplatin (ELOXATIN), Leucovorin & Flourouracil (ADRUCIL).    °  °To help prevent nausea and vomiting after your treatment, we encourage you to take your nausea medication as directed. ° °BELOW ARE SYMPTOMS THAT SHOULD BE REPORTED IMMEDIATELY: °*FEVER GREATER THAN 100.4 F (38 °C) OR HIGHER °*CHILLS OR SWEATING °*NAUSEA AND VOMITING THAT IS NOT CONTROLLED WITH YOUR NAUSEA MEDICATION °*UNUSUAL SHORTNESS OF BREATH °*UNUSUAL BRUISING OR BLEEDING °*URINARY PROBLEMS (pain or burning when urinating, or frequent urination) °*BOWEL PROBLEMS (unusual diarrhea, constipation, pain near the anus) °TENDERNESS IN MOUTH AND THROAT WITH OR WITHOUT PRESENCE OF ULCERS (sore throat, sores in mouth, or a toothache) °UNUSUAL RASH, SWELLING OR PAIN  °UNUSUAL VAGINAL DISCHARGE OR ITCHING  ° °Items with * indicate a potential emergency and should be followed up as soon as possible or go to the Emergency Department if any problems should occur. ° °Please show the CHEMOTHERAPY ALERT  CARD or IMMUNOTHERAPY ALERT CARD at check-in to the Emergency Department and triage nurse. ° °Should you have questions after your visit or need to cancel or reschedule your appointment, please contact [facility]  Dept: [phone]  and follow the prompts.  Office hours are 8:00 a.m. to 4:30 p.m. Monday - Friday. Please note that voicemails left after 4:00 p.m. may not be returned until the following business day.  We are closed weekends and major holidays. You have access to a nurse at all times for urgent questions. Please call the main number to the clinic Dept: [phone] and follow the prompts. ° ° °For any non-urgent questions, you may also contact your provider using MyChart. We now offer e-Visits for anyone 18 and older to request care online for non-urgent symptoms. For details visit [email or web address]. °  °Also download the MyChart app! Go to the app store, search "MyChart", open the app, select , and log in with your MyChart username and password. ° °Due to Covid, a mask is required upon entering the hospital/clinic. If you do not have a mask, one will be given to you upon arrival. For doctor visits, patients may have 1 support person aged 18 or older with them. For treatment visits, patients cannot have anyone with them due to current Covid guidelines and our immunocompromised population.  ° °Oxaliplatin Injection °What is this medication? °OXALIPLATIN (ox AL i PLA tin) is a chemotherapy drug. It targets fast dividing cells, like cancer cells, and causes these cells to die. This medicine is used to treat cancers of the colon and rectum, and   many other cancers. This medicine may be used for other purposes; ask your health care provider or pharmacist if you have questions. COMMON BRAND NAME(S): Eloxatin What should I tell my care team before I take this medication? They need to know if you have any of these conditions: heart disease history of irregular  heartbeat liver disease low blood counts, like white cells, platelets, or red blood cells lung or breathing disease, like asthma take medicines that treat or prevent blood clots tingling of the fingers or toes, or other nerve disorder an unusual or allergic reaction to oxaliplatin, other chemotherapy, other medicines, foods, dyes, or preservatives pregnant or trying to get pregnant breast-feeding How should I use this medication? This drug is given as an infusion into a vein. It is administered in a hospital or clinic by a specially trained health care professional. Talk to your pediatrician regarding the use of this medicine in children. Special care may be needed. Overdosage: If you think you have taken too much of this medicine contact a poison control center or emergency room at once. NOTE: This medicine is only for you. Do not share this medicine with others. What if I miss a dose? It is important not to miss a dose. Call your doctor or health care professional if you are unable to keep an appointment. What may interact with this medication? Do not take this medicine with any of the following medications: cisapride dronedarone pimozide thioridazine This medicine may also interact with the following medications: aspirin and aspirin-like medicines certain medicines that treat or prevent blood clots like warfarin, apixaban, dabigatran, and rivaroxaban cisplatin cyclosporine diuretics medicines for infection like acyclovir, adefovir, amphotericin B, bacitracin, cidofovir, foscarnet, ganciclovir, gentamicin, pentamidine, vancomycin NSAIDs, medicines for pain and inflammation, like ibuprofen or naproxen other medicines that prolong the QT interval (an abnormal heart rhythm) pamidronate zoledronic acid This list may not describe all possible interactions. Give your health care provider a list of all the medicines, herbs, non-prescription drugs, or dietary supplements you use. Also tell  them if you smoke, drink alcohol, or use illegal drugs. Some items may interact with your medicine. What should I watch for while using this medication? Your condition will be monitored carefully while you are receiving this medicine. You may need blood work done while you are taking this medicine. This medicine may make you feel generally unwell. This is not uncommon as chemotherapy can affect healthy cells as well as cancer cells. Report any side effects. Continue your course of treatment even though you feel ill unless your healthcare professional tells you to stop. This medicine can make you more sensitive to cold. Do not drink cold drinks or use ice. Cover exposed skin before coming in contact with cold temperatures or cold objects. When out in cold weather wear warm clothing and cover your mouth and nose to warm the air that goes into your lungs. Tell your doctor if you get sensitive to the cold. Do not become pregnant while taking this medicine or for 9 months after stopping it. Women should inform their health care professional if they wish to become pregnant or think they might be pregnant. Men should not father a child while taking this medicine and for 6 months after stopping it. There is potential for serious side effects to an unborn child. Talk to your health care professional for more information. Do not breast-feed a child while taking this medicine or for 3 months after stopping it. This medicine has caused ovarian failure in  some women. This medicine may make it more difficult to get pregnant. Talk to your health care professional if you are concerned about your fertility. °This medicine has caused decreased sperm counts in some men. This may make it more difficult to father a child. Talk to your health care professional if you are concerned about your fertility. °This medicine may increase your risk of getting an infection. Call your health care professional for advice if you get a fever,  chills, or sore throat, or other symptoms of a cold or flu. Do not treat yourself. Try to avoid being around people who are sick. °Avoid taking medicines that contain aspirin, acetaminophen, ibuprofen, naproxen, or ketoprofen unless instructed by your health care professional. These medicines may hide a fever. °Be careful brushing or flossing your teeth or using a toothpick because you may get an infection or bleed more easily. If you have any dental work done, tell your dentist you are receiving this medicine. °What side effects may I notice from receiving this medication? °Side effects that you should report to your doctor or health care professional as soon as possible: °allergic reactions like skin rash, itching or hives, swelling of the face, lips, or tongue °breathing problems °cough °low blood counts - this medicine may decrease the number of white blood cells, red blood cells, and platelets. You may be at increased risk for infections and bleeding °nausea, vomiting °pain, redness, or irritation at site where injected °pain, tingling, numbness in the hands or feet °signs and symptoms of bleeding such as bloody or black, tarry stools; red or dark brown urine; spitting up blood or brown material that looks like coffee grounds; red spots on the skin; unusual bruising or bleeding from the eyes, gums, or nose °signs and symptoms of a dangerous change in heartbeat or heart rhythm like chest pain; dizziness; fast, irregular heartbeat; palpitations; feeling faint or lightheaded; falls °signs and symptoms of infection like fever; chills; cough; sore throat; pain or trouble passing urine °signs and symptoms of liver injury like dark yellow or brown urine; general ill feeling or flu-like symptoms; light-colored stools; loss of appetite; nausea; right upper belly pain; unusually weak or tired; yellowing of the eyes or skin °signs and symptoms of low red blood cells or anemia such as unusually weak or tired; feeling faint  or lightheaded; falls °signs and symptoms of muscle injury like dark urine; trouble passing urine or change in the amount of urine; unusually weak or tired; muscle pain; back pain °Side effects that usually do not require medical attention (report to your doctor or health care professional if they continue or are bothersome): °changes in taste °diarrhea °gas °hair loss °loss of appetite °mouth sores °This list may not describe all possible side effects. Call your doctor for medical advice about side effects. You may report side effects to FDA at 1-800-FDA-1088. °Where should I keep my medication? °This drug is given in a hospital or clinic and will not be stored at home. °NOTE: This sheet is a summary. It may not cover all possible information. If you have questions about this medicine, talk to your doctor, pharmacist, or health care provider. °© 2022 Elsevier/Gold Standard (2020-10-10 00:00:00) ° °Leucovorin injection °What is this medication? °LEUCOVORIN (loo koe VOR in) is used to prevent or treat the harmful effects of some medicines. This medicine is used to treat anemia caused by a low amount of folic acid in the body. It is also used with 5-fluorouracil (5-FU) to treat colon   cancer. This medicine may be used for other purposes; ask your health care provider or pharmacist if you have questions. What should I tell my care team before I take this medication? They need to know if you have any of these conditions: anemia from low levels of vitamin B-12 in the blood an unusual or allergic reaction to leucovorin, folic acid, other medicines, foods, dyes, or preservatives pregnant or trying to get pregnant breast-feeding How should I use this medication? This medicine is for injection into a muscle or into a vein. It is given by a health care professional in a hospital or clinic setting. Talk to your pediatrician regarding the use of this medicine in children. Special care may be needed. Overdosage: If you  think you have taken too much of this medicine contact a poison control center or emergency room at once. NOTE: This medicine is only for you. Do not share this medicine with others. What if I miss a dose? This does not apply. What may interact with this medication? capecitabine fluorouracil phenobarbital phenytoin primidone trimethoprim-sulfamethoxazole This list may not describe all possible interactions. Give your health care provider a list of all the medicines, herbs, non-prescription drugs, or dietary supplements you use. Also tell them if you smoke, drink alcohol, or use illegal drugs. Some items may interact with your medicine. What should I watch for while using this medication? Your condition will be monitored carefully while you are receiving this medicine. This medicine may increase the side effects of 5-fluorouracil, 5-FU. Tell your doctor or health care professional if you have diarrhea or mouth sores that do not get better or that get worse. What side effects may I notice from receiving this medication? Side effects that you should report to your doctor or health care professional as soon as possible: allergic reactions like skin rash, itching or hives, swelling of the face, lips, or tongue breathing problems fever, infection mouth sores unusual bleeding or bruising unusually weak or tired Side effects that usually do not require medical attention (report to your doctor or health care professional if they continue or are bothersome): constipation or diarrhea loss of appetite nausea, vomiting This list may not describe all possible side effects. Call your doctor for medical advice about side effects. You may report side effects to FDA at 1-800-FDA-1088. Where should I keep my medication? This drug is given in a hospital or clinic and will not be stored at home. NOTE: This sheet is a summary. It may not cover all possible information. If you have questions about this  medicine, talk to your doctor, pharmacist, or health care provider.  2022 Elsevier/Gold Standard (2007-07-30 00:00:00)  Fluorouracil, 5-FU injection What is this medication? FLUOROURACIL, 5-FU (flure oh YOOR a sil) is a chemotherapy drug. It slows the growth of cancer cells. This medicine is used to treat many types of cancer like breast cancer, colon or rectal cancer, pancreatic cancer, and stomach cancer. This medicine may be used for other purposes; ask your health care provider or pharmacist if you have questions. COMMON BRAND NAME(S): Adrucil What should I tell my care team before I take this medication? They need to know if you have any of these conditions: blood disorders dihydropyrimidine dehydrogenase (DPD) deficiency infection (especially a virus infection such as chickenpox, cold sores, or herpes) kidney disease liver disease malnourished, poor nutrition recent or ongoing radiation therapy an unusual or allergic reaction to fluorouracil, other chemotherapy, other medicines, foods, dyes, or preservatives pregnant or trying to get pregnant  breast-feeding How should I use this medication? This drug is given as an infusion or injection into a vein. It is administered in a hospital or clinic by a specially trained health care professional. Talk to your pediatrician regarding the use of this medicine in children. Special care may be needed. Overdosage: If you think you have taken too much of this medicine contact a poison control center or emergency room at once. NOTE: This medicine is only for you. Do not share this medicine with others. What if I miss a dose? It is important not to miss your dose. Call your doctor or health care professional if you are unable to keep an appointment. What may interact with this medication? Do not take this medicine with any of the following medications: live virus vaccines This medicine may also interact with the following medications: medicines  that treat or prevent blood clots like warfarin, enoxaparin, and dalteparin This list may not describe all possible interactions. Give your health care provider a list of all the medicines, herbs, non-prescription drugs, or dietary supplements you use. Also tell them if you smoke, drink alcohol, or use illegal drugs. Some items may interact with your medicine. What should I watch for while using this medication? Visit your doctor for checks on your progress. This drug may make you feel generally unwell. This is not uncommon, as chemotherapy can affect healthy cells as well as cancer cells. Report any side effects. Continue your course of treatment even though you feel ill unless your doctor tells you to stop. In some cases, you may be given additional medicines to help with side effects. Follow all directions for their use. Call your doctor or health care professional for advice if you get a fever, chills or sore throat, or other symptoms of a cold or flu. Do not treat yourself. This drug decreases your body's ability to fight infections. Try to avoid being around people who are sick. This medicine may increase your risk to bruise or bleed. Call your doctor or health care professional if you notice any unusual bleeding. Be careful brushing and flossing your teeth or using a toothpick because you may get an infection or bleed more easily. If you have any dental work done, tell your dentist you are receiving this medicine. Avoid taking products that contain aspirin, acetaminophen, ibuprofen, naproxen, or ketoprofen unless instructed by your doctor. These medicines may hide a fever. Do not become pregnant while taking this medicine. Women should inform their doctor if they wish to become pregnant or think they might be pregnant. There is a potential for serious side effects to an unborn child. Talk to your health care professional or pharmacist for more information. Do not breast-feed an infant while taking  this medicine. Men should inform their doctor if they wish to father a child. This medicine may lower sperm counts. Do not treat diarrhea with over the counter products. Contact your doctor if you have diarrhea that lasts more than 2 days or if it is severe and watery. This medicine can make you more sensitive to the sun. Keep out of the sun. If you cannot avoid being in the sun, wear protective clothing and use sunscreen. Do not use sun lamps or tanning beds/booths. What side effects may I notice from receiving this medication? Side effects that you should report to your doctor or health care professional as soon as possible: allergic reactions like skin rash, itching or hives, swelling of the face, lips, or tongue low blood  counts - this medicine may decrease the number of white blood cells, red blood cells and platelets. You may be at increased risk for infections and bleeding. signs of infection - fever or chills, cough, sore throat, pain or difficulty passing urine signs of decreased platelets or bleeding - bruising, pinpoint red spots on the skin, black, tarry stools, blood in the urine signs of decreased red blood cells - unusually weak or tired, fainting spells, lightheadedness breathing problems changes in vision chest pain mouth sores nausea and vomiting pain, swelling, redness at site where injected pain, tingling, numbness in the hands or feet redness, swelling, or sores on hands or feet stomach pain unusual bleeding Side effects that usually do not require medical attention (report to your doctor or health care professional if they continue or are bothersome): changes in finger or toe nails diarrhea dry or itchy skin hair loss headache loss of appetite sensitivity of eyes to the light stomach upset unusually teary eyes This list may not describe all possible side effects. Call your doctor for medical advice about side effects. You may report side effects to FDA at  1-800-FDA-1088. Where should I keep my medication? This drug is given in a hospital or clinic and will not be stored at home. NOTE: This sheet is a summary. It may not cover all possible information. If you have questions about this medicine, talk to your doctor, pharmacist, or health care provider.  2022 Elsevier/Gold Standard (2020-10-10 00:00:00)  The chemotherapy medication bag should finish at 46 hours, 96 hours, or 7 days. For example, if your pump is scheduled for 46 hours and it was put on at 4:00 p.m., it should finish at 2:00 p.m. the day it is scheduled to come off regardless of your appointment time.     Estimated time to finish at 1:00 p.m. on Wednesday 04/04/2021.   If the display on your pump reads "Low Volume" and it is beeping, take the batteries out of the pump and come to the cancer center for it to be taken off.   If the pump alarms go off prior to the pump reading "Low Volume" then call [phone] and someone can assist you.  If the plunger comes out and the chemotherapy medication is leaking out, please use your home chemo spill kit to clean up the spill. Do NOT use paper towels or other household products.  If you have problems or questions regarding your pump, please call either [phone] (24 hours a day) or the cancer center Monday-Friday 8:00 a.m.- 4:30 p.m. at the clinic number and we will assist you. If you are unable to get assistance, then go to the nearest Emergency Department and ask the staff to contact the IV team for assistance.

## 2021-04-02 NOTE — Addendum Note (Signed)
Addended by: Wadie Lessen on: 04/02/2021 10:58 AM   Modules accepted: Orders

## 2021-04-03 ENCOUNTER — Other Ambulatory Visit (HOSPITAL_BASED_OUTPATIENT_CLINIC_OR_DEPARTMENT_OTHER): Payer: Self-pay

## 2021-04-04 ENCOUNTER — Other Ambulatory Visit: Payer: Self-pay | Admitting: Family Medicine

## 2021-04-04 ENCOUNTER — Encounter: Payer: Self-pay | Admitting: *Deleted

## 2021-04-04 ENCOUNTER — Other Ambulatory Visit (HOSPITAL_BASED_OUTPATIENT_CLINIC_OR_DEPARTMENT_OTHER): Payer: Self-pay

## 2021-04-04 ENCOUNTER — Inpatient Hospital Stay: Payer: No Typology Code available for payment source | Attending: Oncology

## 2021-04-04 ENCOUNTER — Other Ambulatory Visit: Payer: Self-pay

## 2021-04-04 VITALS — BP 144/80 | HR 70 | Temp 98.3°F | Resp 18

## 2021-04-04 DIAGNOSIS — M549 Dorsalgia, unspecified: Secondary | ICD-10-CM | POA: Diagnosis not present

## 2021-04-04 DIAGNOSIS — D6959 Other secondary thrombocytopenia: Secondary | ICD-10-CM | POA: Insufficient documentation

## 2021-04-04 DIAGNOSIS — Z5111 Encounter for antineoplastic chemotherapy: Secondary | ICD-10-CM | POA: Insufficient documentation

## 2021-04-04 DIAGNOSIS — R630 Anorexia: Secondary | ICD-10-CM | POA: Diagnosis not present

## 2021-04-04 DIAGNOSIS — C221 Intrahepatic bile duct carcinoma: Secondary | ICD-10-CM | POA: Diagnosis present

## 2021-04-04 DIAGNOSIS — R059 Cough, unspecified: Secondary | ICD-10-CM | POA: Diagnosis not present

## 2021-04-04 DIAGNOSIS — I10 Essential (primary) hypertension: Secondary | ICD-10-CM | POA: Insufficient documentation

## 2021-04-04 DIAGNOSIS — E1142 Type 2 diabetes mellitus with diabetic polyneuropathy: Secondary | ICD-10-CM | POA: Insufficient documentation

## 2021-04-04 DIAGNOSIS — E785 Hyperlipidemia, unspecified: Secondary | ICD-10-CM | POA: Insufficient documentation

## 2021-04-04 MED ORDER — SODIUM CHLORIDE 0.9% FLUSH
10.0000 mL | INTRAVENOUS | Status: DC | PRN
  Administered 2021-04-04: 10 mL

## 2021-04-04 MED ORDER — HEPARIN SOD (PORK) LOCK FLUSH 100 UNIT/ML IV SOLN
500.0000 [IU] | Freq: Once | INTRAVENOUS | Status: AC | PRN
  Administered 2021-04-04: 500 [IU]

## 2021-04-04 NOTE — Progress Notes (Signed)
Faxed 04/02/21 office note, labs and med list to [facility] [phone]. Attention: Wilford Sports, LPN ?

## 2021-04-04 NOTE — Patient Instructions (Signed)

## 2021-04-06 ENCOUNTER — Other Ambulatory Visit (HOSPITAL_BASED_OUTPATIENT_CLINIC_OR_DEPARTMENT_OTHER): Payer: Self-pay

## 2021-04-06 MED ORDER — UNIFINE PENTIPS 31G X 5 MM MISC
11 refills | Status: DC
  Filled 2021-04-06 – 2021-04-11 (×2): qty 100, 25d supply, fill #0

## 2021-04-06 NOTE — Telephone Encounter (Signed)
Received call from [facility] from [facility] to request increase of needles; states patient needs more than 4 per day with chemo. ? ?Please advise at [phone]. ?

## 2021-04-10 ENCOUNTER — Encounter: Payer: Self-pay | Admitting: Family Medicine

## 2021-04-10 DIAGNOSIS — E1165 Type 2 diabetes mellitus with hyperglycemia: Secondary | ICD-10-CM

## 2021-04-10 LAB — HM DIABETES EYE EXAM

## 2021-04-11 ENCOUNTER — Other Ambulatory Visit (HOSPITAL_BASED_OUTPATIENT_CLINIC_OR_DEPARTMENT_OTHER): Payer: Self-pay

## 2021-04-11 ENCOUNTER — Telehealth: Payer: Self-pay | Admitting: Family Medicine

## 2021-04-11 MED ORDER — UNIFINE PENTIPS 31G X 5 MM MISC
1.0000 | Freq: Every day | 11 refills | Status: DC
  Filled 2021-04-11: qty 200, 40d supply, fill #0
  Filled 2021-05-08 – 2021-05-14 (×2): qty 200, 40d supply, fill #1

## 2021-04-11 NOTE — Addendum Note (Signed)
Addended by: Wadie Lessen on: 04/11/2021 12:05 PM ? ? Modules accepted: Orders ? ?

## 2021-04-11 NOTE — Telephone Encounter (Signed)
Received call from [facility] at [facility] to request new Rx for insulin pen needle tips; requesting 5x a day; patient on chemo.  ? ?Please advise at [phone].  ?

## 2021-04-11 NOTE — Telephone Encounter (Signed)
Rx sent to pharmacy   

## 2021-04-15 ENCOUNTER — Other Ambulatory Visit: Payer: Self-pay | Admitting: Oncology

## 2021-04-16 ENCOUNTER — Inpatient Hospital Stay: Payer: No Typology Code available for payment source | Admitting: Oncology

## 2021-04-16 ENCOUNTER — Other Ambulatory Visit: Payer: No Typology Code available for payment source

## 2021-04-16 ENCOUNTER — Inpatient Hospital Stay: Payer: No Typology Code available for payment source

## 2021-04-16 ENCOUNTER — Ambulatory Visit: Payer: No Typology Code available for payment source

## 2021-04-16 ENCOUNTER — Other Ambulatory Visit: Payer: Self-pay

## 2021-04-16 ENCOUNTER — Ambulatory Visit: Payer: No Typology Code available for payment source | Admitting: Nurse Practitioner

## 2021-04-16 ENCOUNTER — Other Ambulatory Visit (HOSPITAL_BASED_OUTPATIENT_CLINIC_OR_DEPARTMENT_OTHER): Payer: Self-pay

## 2021-04-16 ENCOUNTER — Encounter: Payer: Self-pay | Admitting: *Deleted

## 2021-04-16 VITALS — BP 149/90 | HR 70 | Temp 98.1°F | Resp 18 | Ht 73.0 in | Wt 247.0 lb

## 2021-04-16 DIAGNOSIS — C221 Intrahepatic bile duct carcinoma: Secondary | ICD-10-CM

## 2021-04-16 DIAGNOSIS — Z5111 Encounter for antineoplastic chemotherapy: Secondary | ICD-10-CM | POA: Diagnosis not present

## 2021-04-16 LAB — CMP (CANCER CENTER ONLY)
ALT: 25 U/L (ref 0–44)
AST: 67 U/L — ABNORMAL HIGH (ref 15–41)
Albumin: 3.6 g/dL (ref 3.5–5.0)
Alkaline Phosphatase: 111 U/L (ref 38–126)
Anion gap: 4 — ABNORMAL LOW (ref 5–15)
BUN: 15 mg/dL (ref 6–20)
CO2: 27 mmol/L (ref 22–32)
Calcium: 10 mg/dL (ref 8.9–10.3)
Chloride: 102 mmol/L (ref 98–111)
Creatinine: 0.94 mg/dL (ref 0.61–1.24)
GFR, Estimated: >60 mL/min (ref >60)
Glucose, Bld: 215 mg/dL — ABNORMAL HIGH (ref 70–99)
Potassium: 4.3 mmol/L (ref 3.5–5.1)
Sodium: 133 mmol/L — ABNORMAL LOW (ref 135–145)
Total Bilirubin: 0.9 mg/dL (ref 0.3–1.2)
Total Protein: 7.2 g/dL (ref 6.5–8.1)

## 2021-04-16 LAB — CBC WITH DIFFERENTIAL (CANCER CENTER ONLY)
Abs Immature Granulocytes: 0.01 10*3/uL (ref 0.00–0.07)
Basophils Absolute: 0.1 10*3/uL (ref 0.0–0.1)
Basophils Relative: 1 %
Eosinophils Absolute: 0.2 10*3/uL (ref 0.0–0.5)
Eosinophils Relative: 6 %
HCT: 36.7 % — ABNORMAL LOW (ref 39.0–52.0)
Hemoglobin: 12.1 g/dL — ABNORMAL LOW (ref 13.0–17.0)
Immature Granulocytes: 0 %
Lymphocytes Relative: 21 %
Lymphs Abs: 0.9 10*3/uL (ref 0.7–4.0)
MCH: 32.8 pg (ref 26.0–34.0)
MCHC: 33 g/dL (ref 30.0–36.0)
MCV: 99.5 fL (ref 80.0–100.0)
Monocytes Absolute: 0.6 10*3/uL (ref 0.1–1.0)
Monocytes Relative: 15 %
Neutro Abs: 2.5 10*3/uL (ref 1.7–7.7)
Neutrophils Relative %: 57 %
Platelet Count: 92 10*3/uL — ABNORMAL LOW (ref 150–400)
RBC: 3.69 MIL/uL — ABNORMAL LOW (ref 4.22–5.81)
RDW: 15.6 % — ABNORMAL HIGH (ref 11.5–15.5)
WBC Count: 4.4 10*3/uL (ref 4.0–10.5)
nRBC: 0 % (ref 0.0–0.2)

## 2021-04-16 LAB — MAGNESIUM: Magnesium: 2 mg/dL (ref 1.7–2.4)

## 2021-04-16 MED ORDER — DEXTROSE 5 % IV SOLN: Freq: Once | INTRAVENOUS | Status: AC

## 2021-04-16 MED ORDER — DEXAMETHASONE SODIUM PHOSPHATE 10 MG/ML IJ SOLN
5.0000 mg | Freq: Once | INTRAMUSCULAR | Status: AC
  Administered 2021-04-16: 5 mg via INTRAVENOUS
  Filled 2021-04-16: qty 1

## 2021-04-16 MED ORDER — LEUCOVORIN CALCIUM INJECTION 350 MG
400.0000 mg/m2 | Freq: Once | INTRAVENOUS | Status: AC
  Administered 2021-04-16: 944 mg via INTRAVENOUS
  Filled 2021-04-16: qty 47.2

## 2021-04-16 MED ORDER — FLUOROURACIL CHEMO INJECTION 2.5 GM/50ML
400.0000 mg/m2 | Freq: Once | INTRAVENOUS | Status: AC
  Administered 2021-04-16: 950 mg via INTRAVENOUS
  Filled 2021-04-16: qty 19

## 2021-04-16 MED ORDER — PALONOSETRON HCL INJECTION 0.25 MG/5ML
0.2500 mg | Freq: Once | INTRAVENOUS | Status: AC
  Administered 2021-04-16: 0.25 mg via INTRAVENOUS
  Filled 2021-04-16: qty 5

## 2021-04-16 MED ORDER — SODIUM CHLORIDE 0.9 % IV SOLN
5000.0000 mg | INTRAVENOUS | Status: DC
  Administered 2021-04-16: 5000 mg via INTRAVENOUS
  Filled 2021-04-16: qty 100

## 2021-04-16 MED ORDER — OXALIPLATIN CHEMO INJECTION 100 MG/20ML
50.0000 mg/m2 | Freq: Once | INTRAVENOUS | Status: AC
  Administered 2021-04-16: 120 mg via INTRAVENOUS
  Filled 2021-04-16: qty 20

## 2021-04-16 NOTE — Progress Notes (Signed)
Patient presents for treatment. RN assessment completed along with the following: ? ?Labs/vitals reviewed - Yes, and please see collab nurse note.    ?Weight within 10% of previous measurement - Yes ?Informed consent completed and reflects current therapy/intent - Yes, on date 03/05/2021             ?Provider progress note reviewed - Yes, today's provider note was reviewed. ?Treatment/Antibody/Supportive plan reviewed - Yes, and there are no adjustments needed for today's treatment. ?S&H and other orders reviewed - Yes, and there are no additional orders identified. ?Previous treatment date reviewed - Yes, and the appropriate amount of time has elapsed between treatments. ?Clinic Hand Off Received from - Yes from [place], RN ? ?Patient to proceed with treatment.  ? ?

## 2021-04-16 NOTE — Patient Instructions (Signed)
[facility]   Discharge Instructions: Thank you for choosing [facility] to provide your oncology and hematology care.   If you have a lab appointment with the [facility], please go directly to the [facility] and check in at the registration area.   Wear comfortable clothing and clothing appropriate for easy access to any Portacath or PICC line.   We strive to give you quality time with your provider. You may need to reschedule your appointment if you arrive late (15 or more minutes).  Arriving late affects you and other patients whose appointments are after yours.  Also, if you miss three or more appointments without notifying the office, you may be dismissed from the clinic at the providers discretion.      For prescription refill requests, have your pharmacy contact our office and allow 72 hours for refills to be completed.    Today you received the following chemotherapy and/or immunotherapy agents Oxaliplatin (ELOXATIN), Leucovorin & Flourouracil (ADRUCIL).      To help prevent nausea and vomiting after your treatment, we encourage you to take your nausea medication as directed.  BELOW ARE SYMPTOMS THAT SHOULD BE REPORTED IMMEDIATELY: *FEVER GREATER THAN 100.4 F (38 C) OR HIGHER *CHILLS OR SWEATING *NAUSEA AND VOMITING THAT IS NOT CONTROLLED WITH YOUR NAUSEA MEDICATION *UNUSUAL SHORTNESS OF BREATH *UNUSUAL BRUISING OR BLEEDING *URINARY PROBLEMS (pain or burning when urinating, or frequent urination) *BOWEL PROBLEMS (unusual diarrhea, constipation, pain near the anus) TENDERNESS IN MOUTH AND THROAT WITH OR WITHOUT PRESENCE OF ULCERS (sore throat, sores in mouth, or a toothache) UNUSUAL RASH, SWELLING OR PAIN  UNUSUAL VAGINAL DISCHARGE OR ITCHING   Items with * indicate a potential emergency and should be followed up as soon as possible or go to the Emergency Department if any problems should occur.  Please show the CHEMOTHERAPY ALERT  CARD or IMMUNOTHERAPY ALERT CARD at check-in to the Emergency Department and triage nurse.  Should you have questions after your visit or need to cancel or reschedule your appointment, please contact [facility]  Dept: [phone]  and follow the prompts.  Office hours are 8:00 a.m. to 4:30 p.m. Monday - Friday. Please note that voicemails left after 4:00 p.m. may not be returned until the following business day.  We are closed weekends and major holidays. You have access to a nurse at all times for urgent questions. Please call the main number to the clinic Dept: [phone] and follow the prompts.   For any non-urgent questions, you may also contact your provider using MyChart. We now offer e-Visits for anyone 61 and older to request care online for non-urgent symptoms. For details visit [email or web address].   Also download the MyChart app! Go to the app store, search "MyChart", open the app, select [facility], and log in with your MyChart username and password.  Due to Covid, a mask is required upon entering the hospital/clinic. If you do not have a mask, one will be given to you upon arrival. For doctor visits, patients may have 1 support person aged 47 or older with them. For treatment visits, patients cannot have anyone with them due to current Covid guidelines and our immunocompromised population.   Oxaliplatin Injection What is this medication? OXALIPLATIN (ox AL i PLA tin) is a chemotherapy drug. It targets fast dividing cells, like cancer cells, and causes these cells to die. This medicine is used to treat cancers of the colon and rectum, and  many other cancers. This medicine may be used for other purposes; ask your health care provider or pharmacist if you have questions. COMMON BRAND NAME(S): Eloxatin What should I tell my care team before I take this medication? They need to know if you have any of these conditions: heart disease history of irregular  heartbeat liver disease low blood counts, like white cells, platelets, or red blood cells lung or breathing disease, like asthma take medicines that treat or prevent blood clots tingling of the fingers or toes, or other nerve disorder an unusual or allergic reaction to oxaliplatin, other chemotherapy, other medicines, foods, dyes, or preservatives pregnant or trying to get pregnant breast-feeding How should I use this medication? This drug is given as an infusion into a vein. It is administered in a hospital or clinic by a specially trained health care professional. Talk to your pediatrician regarding the use of this medicine in children. Special care may be needed. Overdosage: If you think you have taken too much of this medicine contact a poison control center or emergency room at once. NOTE: This medicine is only for you. Do not share this medicine with others. What if I miss a dose? It is important not to miss a dose. Call your doctor or health care professional if you are unable to keep an appointment. What may interact with this medication? Do not take this medicine with any of the following medications: cisapride dronedarone pimozide thioridazine This medicine may also interact with the following medications: aspirin and aspirin-like medicines certain medicines that treat or prevent blood clots like warfarin, apixaban, dabigatran, and rivaroxaban cisplatin cyclosporine diuretics medicines for infection like acyclovir, adefovir, amphotericin B, bacitracin, cidofovir, foscarnet, ganciclovir, gentamicin, pentamidine, vancomycin NSAIDs, medicines for pain and inflammation, like ibuprofen or naproxen other medicines that prolong the QT interval (an abnormal heart rhythm) pamidronate zoledronic acid This list may not describe all possible interactions. Give your health care provider a list of all the medicines, herbs, non-prescription drugs, or dietary supplements you use. Also tell  them if you smoke, drink alcohol, or use illegal drugs. Some items may interact with your medicine. What should I watch for while using this medication? Your condition will be monitored carefully while you are receiving this medicine. You may need blood work done while you are taking this medicine. This medicine may make you feel generally unwell. This is not uncommon as chemotherapy can affect healthy cells as well as cancer cells. Report any side effects. Continue your course of treatment even though you feel ill unless your healthcare professional tells you to stop. This medicine can make you more sensitive to cold. Do not drink cold drinks or use ice. Cover exposed skin before coming in contact with cold temperatures or cold objects. When out in cold weather wear warm clothing and cover your mouth and nose to warm the air that goes into your lungs. Tell your doctor if you get sensitive to the cold. Do not become pregnant while taking this medicine or for 9 months after stopping it. Women should inform their health care professional if they wish to become pregnant or think they might be pregnant. Men should not father a child while taking this medicine and for 6 months after stopping it. There is potential for serious side effects to an unborn child. Talk to your health care professional for more information. Do not breast-feed a child while taking this medicine or for 3 months after stopping it. This medicine has caused ovarian failure in  some women. This medicine may make it more difficult to get pregnant. Talk to your health care professional if you are concerned about your fertility. °This medicine has caused decreased sperm counts in some men. This may make it more difficult to father a child. Talk to your health care professional if you are concerned about your fertility. °This medicine may increase your risk of getting an infection. Call your health care professional for advice if you get a fever,  chills, or sore throat, or other symptoms of a cold or flu. Do not treat yourself. Try to avoid being around people who are sick. °Avoid taking medicines that contain aspirin, acetaminophen, ibuprofen, naproxen, or ketoprofen unless instructed by your health care professional. These medicines may hide a fever. °Be careful brushing or flossing your teeth or using a toothpick because you may get an infection or bleed more easily. If you have any dental work done, tell your dentist you are receiving this medicine. °What side effects may I notice from receiving this medication? °Side effects that you should report to your doctor or health care professional as soon as possible: °allergic reactions like skin rash, itching or hives, swelling of the face, lips, or tongue °breathing problems °cough °low blood counts - this medicine may decrease the number of white blood cells, red blood cells, and platelets. You may be at increased risk for infections and bleeding °nausea, vomiting °pain, redness, or irritation at site where injected °pain, tingling, numbness in the hands or feet °signs and symptoms of bleeding such as bloody or black, tarry stools; red or dark brown urine; spitting up blood or brown material that looks like coffee grounds; red spots on the skin; unusual bruising or bleeding from the eyes, gums, or nose °signs and symptoms of a dangerous change in heartbeat or heart rhythm like chest pain; dizziness; fast, irregular heartbeat; palpitations; feeling faint or lightheaded; falls °signs and symptoms of infection like fever; chills; cough; sore throat; pain or trouble passing urine °signs and symptoms of liver injury like dark yellow or brown urine; general ill feeling or flu-like symptoms; light-colored stools; loss of appetite; nausea; right upper belly pain; unusually weak or tired; yellowing of the eyes or skin °signs and symptoms of low red blood cells or anemia such as unusually weak or tired; feeling faint  or lightheaded; falls °signs and symptoms of muscle injury like dark urine; trouble passing urine or change in the amount of urine; unusually weak or tired; muscle pain; back pain °Side effects that usually do not require medical attention (report to your doctor or health care professional if they continue or are bothersome): °changes in taste °diarrhea °gas °hair loss °loss of appetite °mouth sores °This list may not describe all possible side effects. Call your doctor for medical advice about side effects. You may report side effects to FDA at 1-800-FDA-1088. °Where should I keep my medication? °This drug is given in a hospital or clinic and will not be stored at home. °NOTE: This sheet is a summary. It may not cover all possible information. If you have questions about this medicine, talk to your doctor, pharmacist, or health care provider. °© 2022 [employer] Standard (2020-10-10 00:00:00) ° °Leucovorin injection °What is this medication? °LEUCOVORIN (loo koe VOR in) is used to prevent or treat the harmful effects of some medicines. This medicine is used to treat anemia caused by a low amount of folic acid in the body. It is also used with 5-fluorouracil (5-FU) to treat colon   cancer. This medicine may be used for other purposes; ask your health care provider or pharmacist if you have questions. What should I tell my care team before I take this medication? They need to know if you have any of these conditions: anemia from low levels of vitamin B-12 in the blood an unusual or allergic reaction to leucovorin, folic acid, other medicines, foods, dyes, or preservatives pregnant or trying to get pregnant breast-feeding How should I use this medication? This medicine is for injection into a muscle or into a vein. It is given by a health care professional in a hospital or clinic setting. Talk to your pediatrician regarding the use of this medicine in children. Special care may be needed. Overdosage: If you  think you have taken too much of this medicine contact a poison control center or emergency room at once. NOTE: This medicine is only for you. Do not share this medicine with others. What if I miss a dose? This does not apply. What may interact with this medication? capecitabine fluorouracil phenobarbital phenytoin primidone trimethoprim-sulfamethoxazole This list may not describe all possible interactions. Give your health care provider a list of all the medicines, herbs, non-prescription drugs, or dietary supplements you use. Also tell them if you smoke, drink alcohol, or use illegal drugs. Some items may interact with your medicine. What should I watch for while using this medication? Your condition will be monitored carefully while you are receiving this medicine. This medicine may increase the side effects of 5-fluorouracil, 5-FU. Tell your doctor or health care professional if you have diarrhea or mouth sores that do not get better or that get worse. What side effects may I notice from receiving this medication? Side effects that you should report to your doctor or health care professional as soon as possible: allergic reactions like skin rash, itching or hives, swelling of the face, lips, or tongue breathing problems fever, infection mouth sores unusual bleeding or bruising unusually weak or tired Side effects that usually do not require medical attention (report to your doctor or health care professional if they continue or are bothersome): constipation or diarrhea loss of appetite nausea, vomiting This list may not describe all possible side effects. Call your doctor for medical advice about side effects. You may report side effects to FDA at 1-800-FDA-1088. Where should I keep my medication? This drug is given in a hospital or clinic and will not be stored at home. NOTE: This sheet is a summary. It may not cover all possible information. If you have questions about this  medicine, talk to your doctor, pharmacist, or health care provider.  2022 Elsevier/Gold Standard (2007-07-30 00:00:00)  Fluorouracil, 5-FU injection What is this medication? FLUOROURACIL, 5-FU (flure oh YOOR a sil) is a chemotherapy drug. It slows the growth of cancer cells. This medicine is used to treat many types of cancer like breast cancer, colon or rectal cancer, pancreatic cancer, and stomach cancer. This medicine may be used for other purposes; ask your health care provider or pharmacist if you have questions. COMMON BRAND NAME(S): Adrucil What should I tell my care team before I take this medication? They need to know if you have any of these conditions: blood disorders dihydropyrimidine dehydrogenase (DPD) deficiency infection (especially a virus infection such as chickenpox, cold sores, or herpes) kidney disease liver disease malnourished, poor nutrition recent or ongoing radiation therapy an unusual or allergic reaction to fluorouracil, other chemotherapy, other medicines, foods, dyes, or preservatives pregnant or trying to get pregnant  breast-feeding How should I use this medication? This drug is given as an infusion or injection into a vein. It is administered in a hospital or clinic by a specially trained health care professional. Talk to your pediatrician regarding the use of this medicine in children. Special care may be needed. Overdosage: If you think you have taken too much of this medicine contact a poison control center or emergency room at once. NOTE: This medicine is only for you. Do not share this medicine with others. What if I miss a dose? It is important not to miss your dose. Call your doctor or health care professional if you are unable to keep an appointment. What may interact with this medication? Do not take this medicine with any of the following medications: live virus vaccines This medicine may also interact with the following medications: medicines  that treat or prevent blood clots like warfarin, enoxaparin, and dalteparin This list may not describe all possible interactions. Give your health care provider a list of all the medicines, herbs, non-prescription drugs, or dietary supplements you use. Also tell them if you smoke, drink alcohol, or use illegal drugs. Some items may interact with your medicine. What should I watch for while using this medication? Visit your doctor for checks on your progress. This drug may make you feel generally unwell. This is not uncommon, as chemotherapy can affect healthy cells as well as cancer cells. Report any side effects. Continue your course of treatment even though you feel ill unless your doctor tells you to stop. In some cases, you may be given additional medicines to help with side effects. Follow all directions for their use. Call your doctor or health care professional for advice if you get a fever, chills or sore throat, or other symptoms of a cold or flu. Do not treat yourself. This drug decreases your body's ability to fight infections. Try to avoid being around people who are sick. This medicine may increase your risk to bruise or bleed. Call your doctor or health care professional if you notice any unusual bleeding. Be careful brushing and flossing your teeth or using a toothpick because you may get an infection or bleed more easily. If you have any dental work done, tell your dentist you are receiving this medicine. Avoid taking products that contain aspirin, acetaminophen, ibuprofen, naproxen, or ketoprofen unless instructed by your doctor. These medicines may hide a fever. Do not become pregnant while taking this medicine. Women should inform their doctor if they wish to become pregnant or think they might be pregnant. There is a potential for serious side effects to an unborn child. Talk to your health care professional or pharmacist for more information. Do not breast-feed an infant while taking  this medicine. Men should inform their doctor if they wish to father a child. This medicine may lower sperm counts. Do not treat diarrhea with over the counter products. Contact your doctor if you have diarrhea that lasts more than 2 days or if it is severe and watery. This medicine can make you more sensitive to the sun. Keep out of the sun. If you cannot avoid being in the sun, wear protective clothing and use sunscreen. Do not use sun lamps or tanning beds/booths. What side effects may I notice from receiving this medication? Side effects that you should report to your doctor or health care professional as soon as possible: allergic reactions like skin rash, itching or hives, swelling of the face, lips, or tongue low blood  counts - this medicine may decrease the number of white blood cells, red blood cells and platelets. You may be at increased risk for infections and bleeding. signs of infection - fever or chills, cough, sore throat, pain or difficulty passing urine signs of decreased platelets or bleeding - bruising, pinpoint red spots on the skin, black, tarry stools, blood in the urine signs of decreased red blood cells - unusually weak or tired, fainting spells, lightheadedness breathing problems changes in vision chest pain mouth sores nausea and vomiting pain, swelling, redness at site where injected pain, tingling, numbness in the hands or feet redness, swelling, or sores on hands or feet stomach pain unusual bleeding Side effects that usually do not require medical attention (report to your doctor or health care professional if they continue or are bothersome): changes in finger or toe nails diarrhea dry or itchy skin hair loss headache loss of appetite sensitivity of eyes to the light stomach upset unusually teary eyes This list may not describe all possible side effects. Call your doctor for medical advice about side effects. You may report side effects to FDA at  1-800-FDA-1088. Where should I keep my medication? This drug is given in a hospital or clinic and will not be stored at home. NOTE: This sheet is a summary. It may not cover all possible information. If you have questions about this medicine, talk to your doctor, pharmacist, or health care provider.  2022 [employer]/Gold Standard (2020-10-10 00:00:00)  The chemotherapy medication bag should finish at 46 hours, 96 hours, or 7 days. For example, if your pump is scheduled for 46 hours and it was put on at 4:00 p.m., it should finish at 2:00 p.m. the day it is scheduled to come off regardless of your appointment time.     Estimated time to finish at 10:30 a.m. on Wednesday 04/18/2021.   If the display on your pump reads "Low Volume" and it is beeping, take the batteries out of the pump and come to the cancer center for it to be taken off.   If the pump alarms go off prior to the pump reading "Low Volume" then call [phone] and someone can assist you.  If the plunger comes out and the chemotherapy medication is leaking out, please use your home chemo spill kit to clean up the spill. Do NOT use paper towels or other household products.  If you have problems or questions regarding your pump, please call either [phone] (24 hours a day) or the cancer center Monday-Friday 8:00 a.m.- 4:30 p.m. at the clinic number and we will assist you. If you are unable to get assistance, then go to the nearest Emergency Department and ask the staff to contact the IV team for assistance.

## 2021-04-16 NOTE — Progress Notes (Signed)
Patient seen by Dr. Benay Spice today ? ?Vitals are within treatment parameters. ? ?Labs reviewed by Dr. Benay Spice and are not all within treatment parameters. Platelet count 92,000--OK to treat per Dr. Benay Spice. ? ?Per physician team, patient is ready for treatment and there are NO modifications to the treatment plan.  ?

## 2021-04-16 NOTE — Progress Notes (Signed)
?Matthew Osborne ?OFFICE PROGRESS NOTE ? ? ?Diagnosis: Cholangiocarcinoma ? ?INTERVAL HISTORY:  ? ?[name] completed on cycle of FOLFOX 04/02/2021.  No nausea/vomiting, mouth sores, or diarrhea.  He reports improvement in peripheral numbness.  He has persistent cold sensitivity.  He has an increased cough.  No fever. ? ?Objective: ? ?Vital signs in last 24 hours: ? ?Blood pressure (!) 149/90, pulse 70, temperature 98.1 ?F (36.7 ?C), temperature source Oral, resp. rate 18, height _0  (1.854 m), weight 247 lb (112 kg), SpO2 100 %. ?  ? ?HEENT: No thrush or ulcers ?Resp: Lungs clear bilaterally ?Cardio: Regular rate and rhythm ?GI: Nontender, no hepatosplenomegaly ?Vascular: No leg edema  ?Skin: Palms without erythema ? ?Portacath/PICC-without erythema ? ?Lab Results: ? ?Lab Results  ?Component Value Date  ? WBC 4.4 04/16/2021  ? HGB 12.1 (L) 04/16/2021  ? HCT 36.7 (L) 04/16/2021  ? MCV 99.5 04/16/2021  ? PLT 92 (L) 04/16/2021  ? NEUTROABS 2.5 04/16/2021  ? ? ?CMP  ?Lab Results  ?Component Value Date  ? NA 136 04/02/2021  ? K 4.1 04/02/2021  ? CL 104 04/02/2021  ? CO2 26 04/02/2021  ? GLUCOSE 141 (H) 04/02/2021  ? BUN 20 04/02/2021  ? CREATININE 0.85 04/02/2021  ? CALCIUM 9.6 04/02/2021  ? PROT 7.5 04/02/2021  ? ALBUMIN 3.5 04/02/2021  ? AST 61 (H) 04/02/2021  ? ALT 24 04/02/2021  ? ALKPHOS 95 04/02/2021  ? BILITOT 0.6 04/02/2021  ? GFRNONAA >60 04/02/2021  ? GFRAA 127 01/11/2020  ? ? ?Lab Results  ?Component Value Date  ? CEA1 2.15 01/14/2020  ? EAV409 82 (H) 08/21/2020  ? ? ?Medications: I have reviewed the patient's current medications. ? ? ?Assessment/Plan: ?Cholangiocarcinoma  multiple liver masses and abdominal lymphadenopathy ?CTs 01/13/2020-rounded hypodense mass appears to arise from the pancreas neck, multiple rim-enhancing masses in the liver, primarily left liver with segmental dilation of the left lobe Intermatic bile ducts, ill-defined hypodensity the central liver with effacement of the  left portal vein, enlarged portacaval and retroperitoneal lymph nodes ?Ultrasound-guided biopsy of the left liver lesion 01/19/2020-adenocarcinoma, cytokeratin 7+, MSS, tumor mutation burden 1, IDH1 R132C ?MRI abdomen 01/31/2020-poorly marginated central liver mass, multiple smaller similar satellite liver masses, extrinsic mass-effect at the biliary hilum with intrahepatic biliary ductal dilatation throughout the left liver with mild Intermatic dilatation the superior right liver, no pancreas mass or ductal dilatation, normal spleen size, numerous enlarged enhancing lymph nodes at the porta hepatis, peripancreatic, portacaval, aortocaval, left periaortic chains ?Cycle 1 gemcitabine/cisplatin 02/04/2020 ?Cycle 2 gemcitabine/cisplatin 02/28/2020 ?Cycle 3 gemcitabine/cisplatin 03/20/2020 ?CT abdomen/pelvis 03/31/2020-mild decrease in central liver tumor, mild decrease in size of liver metastases and upper abdominal adenopathy, new splenomegaly ?Cycle 4 gemcitabine/cisplatin 04/10/2020 ?Cycle 5 gemcitabine/cisplatin 05/01/2020 ?Cycle 6 gemcitabine/cisplatin plus durvalumab 05/22/2020 ?CTs 06/06/2020- dominant central liver mass mildly enlarged, other liver lesions and abdominal adenopathy is stable, no evidence of metastatic disease to the chest ?Cycle 7 gemcitabine/cisplatin plus Durvalumab 06/12/2020 ?Cycle 8 gemcitabine/cisplatin plus Durvalumab 06/30/2020 ?Cycle 9 gemcitabine/cisplatin plus Durvalumab 07/31/2020 ?CT abdomen/pelvis 08/20/2020-decreased size of dominant central liver mass, slight increase in size and stable additional liver lesions, stable portacaval node ?Cycle 10 gemcitabine/cisplatin plus Durvalumab 08/21/2020 ?Cycle 11 gemcitabine/cisplatin plus Durvalumab 09/11/2020 ?Cycle 12 gemcitabine/cisplatin plus Durvalumab 10/02/2020 ?Cycle 13 gemcitabine/cisplatin plus Durvalumab 10/23/2020 ?CT abdomen/pelvis 11/10/2020-no change in dominant central liver mass and additional liver lesions, stable upper retroperitoneal  adenopathy ?Cycle 14 gemcitabine/cisplatin plus Durvalumab 11/13/2020 ?Cycle 15 gemcitabine/cisplatin plus Durvalumab 12/04/2020 ?Cycle 15 gemcitabine/cisplatin plus Durvalumab 01/08/2021 ?  Cycle 16 gemcitabine/cisplatin plus Durvalumab 01/30/2021-cisplatin held from day 1 and day 8 secondary to neuropathy ?CTs 02/15/2021-slight increase in size of central liver mass, progressive metastatic lesions in the right liver, stable to slightly decreased periportal lymph nodes, progression of splenomegaly, chronically thrombosed left portal vein ?Cycle 1 FOLFOX 03/05/2021 ?Cycle 2 FOLFOX 03/19/2021, oxaliplatin dose reduced due to thrombocytopenia ?Cycle 3 FOLFOX 04/02/2021 ?Cycle 4 FOLFOX 04/16/2021 ?  ?Cough-likely related to diaphragmatic irritation from #1 ?Anorexia/weight loss ?Hypercalcemia-likely hypercalcemia malignancy, status post intravenous hydration and Zometa 01/14/2020 ?Diabetes ?Hyperlipidemia ?Hypertension ?History of peripheral neuropathy secondary to diabetes ?Severe back pain following Fulphila-oxycodone prescribed ?Thrombocytopenia secondary to chemotherapy-gemcitabine held with day 1 cycle 8 and then dose reduced ?COVID-19 infection 07/22/2020, 12/25/2020 ?Cisplatin induced peripheral neuropathy ? ? ?Disposition: ?[name] appears stable.  He is tolerating the FOLFOX well.  He will complete cycle 4 today.  He will be referred for a restaging CT evaluation after cycle 5 FOLFOX. ? ?[name] will return for an office visit and chemotherapy in 2 weeks.  He will call for an increased cough. ? ?Betsy Coder, MD ? ?04/16/2021  ?8:58 AM ? ? ?

## 2021-04-18 ENCOUNTER — Other Ambulatory Visit: Payer: Self-pay

## 2021-04-18 ENCOUNTER — Inpatient Hospital Stay: Payer: No Typology Code available for payment source

## 2021-04-18 VITALS — BP 147/85 | HR 85 | Temp 98.9°F | Resp 18

## 2021-04-18 DIAGNOSIS — Z5111 Encounter for antineoplastic chemotherapy: Secondary | ICD-10-CM | POA: Diagnosis not present

## 2021-04-18 DIAGNOSIS — C221 Intrahepatic bile duct carcinoma: Secondary | ICD-10-CM

## 2021-04-18 MED ORDER — HEPARIN SOD (PORK) LOCK FLUSH 100 UNIT/ML IV SOLN
500.0000 [IU] | Freq: Once | INTRAVENOUS | Status: AC | PRN
  Administered 2021-04-18: 500 [IU]

## 2021-04-18 MED ORDER — SODIUM CHLORIDE 0.9% FLUSH
10.0000 mL | INTRAVENOUS | Status: DC | PRN
  Administered 2021-04-18: 10 mL

## 2021-04-18 NOTE — Patient Instructions (Signed)

## 2021-04-20 ENCOUNTER — Other Ambulatory Visit (HOSPITAL_BASED_OUTPATIENT_CLINIC_OR_DEPARTMENT_OTHER): Payer: Self-pay

## 2021-04-20 ENCOUNTER — Encounter: Payer: Self-pay | Admitting: *Deleted

## 2021-04-20 NOTE — Progress Notes (Signed)
Faxed 04/16/21 office note, labs, medlist to [facility] [phone] att: Wilford Sports, LPN. ?

## 2021-04-25 ENCOUNTER — Other Ambulatory Visit: Payer: Self-pay | Admitting: Family Medicine

## 2021-04-25 ENCOUNTER — Other Ambulatory Visit (HOSPITAL_BASED_OUTPATIENT_CLINIC_OR_DEPARTMENT_OTHER): Payer: Self-pay

## 2021-04-26 ENCOUNTER — Other Ambulatory Visit (HOSPITAL_BASED_OUTPATIENT_CLINIC_OR_DEPARTMENT_OTHER): Payer: Self-pay

## 2021-04-26 MED ORDER — BISOPROLOL-HYDROCHLOROTHIAZIDE 10-6.25 MG PO TABS
ORAL_TABLET | ORAL | 0 refills | Status: DC
  Filled 2021-04-26: qty 30, 30d supply, fill #0

## 2021-04-29 ENCOUNTER — Other Ambulatory Visit: Payer: Self-pay | Admitting: Oncology

## 2021-04-30 ENCOUNTER — Encounter: Payer: Self-pay | Admitting: *Deleted

## 2021-04-30 ENCOUNTER — Other Ambulatory Visit: Payer: Self-pay

## 2021-04-30 ENCOUNTER — Encounter: Payer: Self-pay | Admitting: Nurse Practitioner

## 2021-04-30 ENCOUNTER — Inpatient Hospital Stay: Payer: No Typology Code available for payment source

## 2021-04-30 ENCOUNTER — Other Ambulatory Visit: Payer: Self-pay | Admitting: Family Medicine

## 2021-04-30 ENCOUNTER — Encounter: Payer: Self-pay | Admitting: Oncology

## 2021-04-30 ENCOUNTER — Inpatient Hospital Stay: Payer: No Typology Code available for payment source | Admitting: Nurse Practitioner

## 2021-04-30 ENCOUNTER — Other Ambulatory Visit (HOSPITAL_BASED_OUTPATIENT_CLINIC_OR_DEPARTMENT_OTHER): Payer: Self-pay

## 2021-04-30 VITALS — BP 131/84 | HR 77 | Temp 98.1°F | Resp 20 | Ht 73.0 in | Wt 243.8 lb

## 2021-04-30 DIAGNOSIS — C221 Intrahepatic bile duct carcinoma: Secondary | ICD-10-CM | POA: Diagnosis not present

## 2021-04-30 DIAGNOSIS — Z5111 Encounter for antineoplastic chemotherapy: Secondary | ICD-10-CM | POA: Diagnosis not present

## 2021-04-30 LAB — CBC WITH DIFFERENTIAL (CANCER CENTER ONLY)
Abs Immature Granulocytes: 0.01 10*3/uL (ref 0.00–0.07)
Basophils Absolute: 0.1 10*3/uL (ref 0.0–0.1)
Basophils Relative: 2 %
Eosinophils Absolute: 0.2 10*3/uL (ref 0.0–0.5)
Eosinophils Relative: 5 %
HCT: 35 % — ABNORMAL LOW (ref 39.0–52.0)
Hemoglobin: 11.8 g/dL — ABNORMAL LOW (ref 13.0–17.0)
Immature Granulocytes: 0 %
Lymphocytes Relative: 27 %
Lymphs Abs: 0.9 10*3/uL (ref 0.7–4.0)
MCH: 33.6 pg (ref 26.0–34.0)
MCHC: 33.7 g/dL (ref 30.0–36.0)
MCV: 99.7 fL (ref 80.0–100.0)
Monocytes Absolute: 0.5 10*3/uL (ref 0.1–1.0)
Monocytes Relative: 16 %
Neutro Abs: 1.7 10*3/uL (ref 1.7–7.7)
Neutrophils Relative %: 50 %
Platelet Count: 124 10*3/uL — ABNORMAL LOW (ref 150–400)
RBC: 3.51 MIL/uL — ABNORMAL LOW (ref 4.22–5.81)
RDW: 15.4 % (ref 11.5–15.5)
WBC Count: 3.4 10*3/uL — ABNORMAL LOW (ref 4.0–10.5)
nRBC: 0 % (ref 0.0–0.2)

## 2021-04-30 LAB — CMP (CANCER CENTER ONLY)
ALT: 25 U/L (ref 0–44)
AST: 66 U/L — ABNORMAL HIGH (ref 15–41)
Albumin: 3.4 g/dL — ABNORMAL LOW (ref 3.5–5.0)
Alkaline Phosphatase: 122 U/L (ref 38–126)
Anion gap: 6 (ref 5–15)
BUN: 12 mg/dL (ref 6–20)
CO2: 25 mmol/L (ref 22–32)
Calcium: 9.5 mg/dL (ref 8.9–10.3)
Chloride: 103 mmol/L (ref 98–111)
Creatinine: 0.91 mg/dL (ref 0.61–1.24)
GFR, Estimated: >60 mL/min (ref >60)
Glucose, Bld: 239 mg/dL — ABNORMAL HIGH (ref 70–99)
Potassium: 3.7 mmol/L (ref 3.5–5.1)
Sodium: 134 mmol/L — ABNORMAL LOW (ref 135–145)
Total Bilirubin: 0.6 mg/dL (ref 0.3–1.2)
Total Protein: 7.2 g/dL (ref 6.5–8.1)

## 2021-04-30 LAB — MAGNESIUM: Magnesium: 2.1 mg/dL (ref 1.7–2.4)

## 2021-04-30 MED ORDER — DEXAMETHASONE SODIUM PHOSPHATE 10 MG/ML IJ SOLN
5.0000 mg | Freq: Once | INTRAMUSCULAR | Status: AC
  Administered 2021-04-30: 5 mg via INTRAVENOUS
  Filled 2021-04-30: qty 1

## 2021-04-30 MED ORDER — SODIUM CHLORIDE 0.9 % IV SOLN
5000.0000 mg | INTRAVENOUS | Status: DC
  Administered 2021-04-30: 5000 mg via INTRAVENOUS
  Filled 2021-04-30: qty 100

## 2021-04-30 MED ORDER — DEXTROSE 5 % IV SOLN: Freq: Once | INTRAVENOUS | Status: AC

## 2021-04-30 MED ORDER — DEXCOM G6 TRANSMITTER MISC
1 refills | Status: DC
  Filled 2021-04-30: qty 1, 30d supply, fill #0
  Filled 2021-04-30: qty 1, 90d supply, fill #0
  Filled 2021-07-27: qty 1, 90d supply, fill #1

## 2021-04-30 MED ORDER — LEUCOVORIN CALCIUM INJECTION 350 MG
400.0000 mg/m2 | Freq: Once | INTRAVENOUS | Status: AC
  Administered 2021-04-30: 944 mg via INTRAVENOUS
  Filled 2021-04-30: qty 47.2

## 2021-04-30 MED ORDER — FLUOROURACIL CHEMO INJECTION 2.5 GM/50ML
400.0000 mg/m2 | Freq: Once | INTRAVENOUS | Status: AC
  Administered 2021-04-30: 950 mg via INTRAVENOUS
  Filled 2021-04-30: qty 19

## 2021-04-30 MED ORDER — DEXCOM G6 TRANSMITTER MISC: 1 refills | Status: DC

## 2021-04-30 MED ORDER — PALONOSETRON HCL INJECTION 0.25 MG/5ML
0.2500 mg | Freq: Once | INTRAVENOUS | Status: AC
  Administered 2021-04-30: 0.25 mg via INTRAVENOUS
  Filled 2021-04-30: qty 5

## 2021-04-30 MED ORDER — OXALIPLATIN CHEMO INJECTION 100 MG/20ML
50.0000 mg/m2 | Freq: Once | INTRAVENOUS | Status: AC
  Administered 2021-04-30: 120 mg via INTRAVENOUS
  Filled 2021-04-30: qty 20

## 2021-04-30 NOTE — Progress Notes (Signed)
Patient presents for treatment. RN assessment completed along with the following: ? ?Labs/vitals reviewed - Yes, and within treatment parameters.   ?Weight within 10% of previous measurement - Yes ?Informed consent completed and reflects current therapy/intent - Yes, on date 03/05/2021             ?Provider progress note reviewed - Yes, today's provider note was reviewed. ?Treatment/Antibody/Supportive plan reviewed - Yes, and there are no adjustments needed for today's treatment. ?S&H and other orders reviewed - Yes, and there are no additional orders identified. ?Previous treatment date reviewed - Yes, and the appropriate amount of time has elapsed between treatments. ?Clinic Hand Off Received from - Yes from [place], RN ? ?Patient to proceed with treatment.  ? ?

## 2021-04-30 NOTE — Telephone Encounter (Signed)
Pt and wife called to inquire about PA for Dexcom. They are upset that it had not been marked as urgent. Advised pt/wife that pharmacy did not indicate this was an urgent request, so it was entered as a standard PA. They would like the request to be changed, or a new one submitted. Advised them that submitting new PA may cause it to receive a denial, as current PA is still in process. Let them know I will call [employer] and try to have current PA updated to reflect urgent request. Also, they did not understand why system now requires a PA when it did not previously. ? ?Spoke with [employer], they state previous PA for Dexcom system expired 03/27/21. Rep was able to start new PA request and merge it with current one. She added more information to the request, such as pt is currently on cancer treatment and has frequent episodes of hyperglycemia during the night. She also updated PA to include entire Dexcom system - transmitter/receiver/sensors for the next year. She states PA will still require maximum 24 hours to process. She was able to provide phone number for pt/wife to check status: [phone]. ? ?Spoke with pt/wife and advised of conversation with [employer] and that current PA has been marked as urgent. Provided phone number to [facility] so they can check status. Advised them we will receive electronic notification once it has been completed. ? ?Nothing further needed at this time.  ?  ?

## 2021-04-30 NOTE — Telephone Encounter (Signed)
Verbal approval given to refill Dexcom G6 transmitter. Pt is due for f/u visit in near future, not seen since 02/2020. Pharmacy to advise pt. ?

## 2021-04-30 NOTE — Patient Instructions (Signed)
[facility]   ?Discharge Instructions: ?Thank you for choosing [facility] to provide your oncology and hematology care.  ? ?If you have a lab appointment with the [facility], please go directly to the [facility] and check in at the registration area. ?  ?Wear comfortable clothing and clothing appropriate for easy access to any Portacath or PICC line.  ? ?We strive to give you quality time with your provider. You may need to reschedule your appointment if you arrive late (15 or more minutes).  Arriving late affects you and other patients whose appointments are after yours.  Also, if you miss three or more appointments without notifying the office, you may be dismissed from the clinic at the provider?s discretion.    ?  ?For prescription refill requests, have your pharmacy contact our office and allow 72 hours for refills to be completed.   ? ?Today you received the following chemotherapy and/or immunotherapy agents Oxaliplatin (ELOXATIN), Leucovorin & Flourouracil (ADRUCIL).    ?  ?To help prevent nausea and vomiting after your treatment, we encourage you to take your nausea medication as directed. ? ?BELOW ARE SYMPTOMS THAT SHOULD BE REPORTED IMMEDIATELY: ?*FEVER GREATER THAN 100.4 F (38 ?C) OR HIGHER ?*CHILLS OR SWEATING ?*NAUSEA AND VOMITING THAT IS NOT CONTROLLED WITH YOUR NAUSEA MEDICATION ?*UNUSUAL SHORTNESS OF BREATH ?*UNUSUAL BRUISING OR BLEEDING ?*URINARY PROBLEMS (pain or burning when urinating, or frequent urination) ?*BOWEL PROBLEMS (unusual diarrhea, constipation, pain near the anus) ?TENDERNESS IN MOUTH AND THROAT WITH OR WITHOUT PRESENCE OF ULCERS (sore throat, sores in mouth, or a toothache) ?UNUSUAL RASH, SWELLING OR PAIN  ?UNUSUAL VAGINAL DISCHARGE OR ITCHING  ? ?Items with * indicate a potential emergency and should be followed up as soon as possible or go to the Emergency Department if any problems should occur. ? ?Please show the CHEMOTHERAPY ALERT  CARD or IMMUNOTHERAPY ALERT CARD at check-in to the Emergency Department and triage nurse. ? ?Should you have questions after your visit or need to cancel or reschedule your appointment, please contact [facility]  Dept: [phone]  and follow the prompts.  Office hours are 8:00 a.m. to 4:30 p.m. Monday - Friday. Please note that voicemails left after 4:00 p.m. may not be returned until the following business day.  We are closed weekends and major holidays. You have access to a nurse at all times for urgent questions. Please call the main number to the clinic Dept: [phone] and follow the prompts. ? ? ?For any non-urgent questions, you may also contact your provider using MyChart. We now offer e-Visits for anyone 77 and older to request care online for non-urgent symptoms. For details visit [email or web address]. ?  ?Also download the MyChart app! Go to the app store, search "MyChart", open the app, select [facility], and log in with your MyChart username and password. ? ?Due to Covid, a mask is required upon entering the hospital/clinic. If you do not have a mask, one will be given to you upon arrival. For doctor visits, patients may have 1 support person aged 101 or older with them. For treatment visits, patients cannot have anyone with them due to current Covid guidelines and our immunocompromised population.  ? ?Oxaliplatin Injection ?What is this medication? ?OXALIPLATIN (ox AL i PLA tin) is a chemotherapy drug. It targets fast dividing cells, like cancer cells, and causes these cells to die. This medicine is used to treat cancers of the colon and rectum, and  many other cancers. ?This medicine may be used for other purposes; ask your health care provider or pharmacist if you have questions. ?COMMON BRAND NAME(S): Eloxatin ?What should I tell my care team before I take this medication? ?They need to know if you have any of these conditions: ?heart disease ?history of irregular  heartbeat ?liver disease ?low blood counts, like white cells, platelets, or red blood cells ?lung or breathing disease, like asthma ?take medicines that treat or prevent blood clots ?tingling of the fingers or toes, or other nerve disorder ?an unusual or allergic reaction to oxaliplatin, other chemotherapy, other medicines, foods, dyes, or preservatives ?pregnant or trying to get pregnant ?breast-feeding ?How should I use this medication? ?This drug is given as an infusion into a vein. It is administered in a hospital or clinic by a specially trained health care professional. ?Talk to your pediatrician regarding the use of this medicine in children. Special care may be needed. ?Overdosage: If you think you have taken too much of this medicine contact a poison control center or emergency room at once. ?NOTE: This medicine is only for you. Do not share this medicine with others. ?What if I miss a dose? ?It is important not to miss a dose. Call your doctor or health care professional if you are unable to keep an appointment. ?What may interact with this medication? ?Do not take this medicine with any of the following medications: ?cisapride ?dronedarone ?pimozide ?thioridazine ?This medicine may also interact with the following medications: ?aspirin and aspirin-like medicines ?certain medicines that treat or prevent blood clots like warfarin, apixaban, dabigatran, and rivaroxaban ?cisplatin ?cyclosporine ?diuretics ?medicines for infection like acyclovir, adefovir, amphotericin B, bacitracin, cidofovir, foscarnet, ganciclovir, gentamicin, pentamidine, vancomycin ?NSAIDs, medicines for pain and inflammation, like ibuprofen or naproxen ?other medicines that prolong the QT interval (an abnormal heart rhythm) ?pamidronate ?zoledronic acid ?This list may not describe all possible interactions. Give your health care provider a list of all the medicines, herbs, non-prescription drugs, or dietary supplements you use. Also tell  them if you smoke, drink alcohol, or use illegal drugs. Some items may interact with your medicine. ?What should I watch for while using this medication? ?Your condition will be monitored carefully while you are receiving this medicine. ?You may need blood work done while you are taking this medicine. ?This medicine may make you feel generally unwell. This is not uncommon as chemotherapy can affect healthy cells as well as cancer cells. Report any side effects. Continue your course of treatment even though you feel ill unless your healthcare professional tells you to stop. ?This medicine can make you more sensitive to cold. Do not drink cold drinks or use ice. Cover exposed skin before coming in contact with cold temperatures or cold objects. When out in cold weather wear warm clothing and cover your mouth and nose to warm the air that goes into your lungs. Tell your doctor if you get sensitive to the cold. ?Do not become pregnant while taking this medicine or for 9 months after stopping it. Women should inform their health care professional if they wish to become pregnant or think they might be pregnant. Men should not father a child while taking this medicine and for 6 months after stopping it. There is potential for serious side effects to an unborn child. Talk to your health care professional for more information. ?Do not breast-feed a child while taking this medicine or for 3 months after stopping it. ?This medicine has caused ovarian failure in  some women. This medicine may make it more difficult to get pregnant. Talk to your health care professional if you are concerned about your fertility. ?This medicine has caused decreased sperm counts in some men. This may make it more difficult to father a child. Talk to your health care professional if you are concerned about your fertility. ?This medicine may increase your risk of getting an infection. Call your health care professional for advice if you get a fever,  chills, or sore throat, or other symptoms of a cold or flu. Do not treat yourself. Try to avoid being around people who are sick. ?Avoid taking medicines that contain aspirin, acetaminophen, ibuprof

## 2021-04-30 NOTE — Progress Notes (Signed)
Patient seen by Ned Card NP today ? ?Vitals are within treatment parameters. ? ?Labs reviewed by Ned Card NP and are within treatment parameters. ? ?Per physician team, patient is ready for treatment and there are NO modifications to the treatment plan.  ? ?His glucose meter is not working--requesting CBG be checked couple times while in treatment area today. ?

## 2021-04-30 NOTE — Addendum Note (Signed)
Addended by: Wadie Lessen on: 04/30/2021 10:10 AM ? ? Modules accepted: Orders ? ?

## 2021-04-30 NOTE — Telephone Encounter (Signed)
PA started via CoverMyMeds for Dexcom G6 Transmitter ? ?Matthew Osborne Key: [id number] - Rx #: [id number] ? ?Dx: E11.9 ? ?Information submitted, request pending. ?

## 2021-04-30 NOTE — Progress Notes (Signed)
?[facility] ?OFFICE PROGRESS NOTE ? ? ?Diagnosis: Cholangiocarcinoma ? ?INTERVAL HISTORY:  ? ?Matthew Osborne returns as scheduled.  He completed cycle 4 FOLFOX 04/16/2021.  He had mild nausea for several days after chemotherapy.  He took Zofran and Compazine as needed.  He noted a single mouth sore at the site of minor trauma from his toothbrush.  His wife reports he went to the dentist last week and was noted to have multiple sores across the roof of his mouth.  He was not aware of the mouth sores.  Gait is intermittently unsteady.  At times he feels as if he is "trembling inside".  He had a nosebleed last week.  On Saturday he "coughed up" bright red blood.  He feels neuropathy symptoms are better. ? ?Objective: ? ?Vital signs in last 24 hours: ? ?Blood pressure 131/84, pulse 77, temperature 98.1 ?F (36.7 ?C), resp. rate 20, height 6' 1" (1.854 m), weight 243 lb 12.8 oz (110.6 kg), SpO2 98 %. ?  ? ?HEENT: No thrush or ulcers. ?Resp: Lungs clear bilaterally. ?Cardio: Regular rate and rhythm. ?GI: Abdomen soft and nontender.  No hepatomegaly. ?Vascular: No leg edema. ?Skin: Palms without erythema. ?Port-A-Cath without erythema. ? ? ?Lab Results: ? ?Lab Results  ?Component Value Date  ? WBC 3.4 (L) 04/30/2021  ? HGB 11.8 (L) 04/30/2021  ? HCT 35.0 (L) 04/30/2021  ? MCV 99.7 04/30/2021  ? PLT 124 (L) 04/30/2021  ? NEUTROABS 1.7 04/30/2021  ? ? ?Imaging: ? ?No results found. ? ?Medications: I have reviewed the patient's current medications. ? ?Assessment/Plan: ?Cholangiocarcinoma  multiple liver masses and abdominal lymphadenopathy ?CTs 01/13/2020-rounded hypodense mass appears to arise from the pancreas neck, multiple rim-enhancing masses in the liver, primarily left liver with segmental dilation of the left lobe Intermatic bile ducts, ill-defined hypodensity the central liver with effacement of the left portal vein, enlarged portacaval and retroperitoneal lymph nodes ?Ultrasound-guided biopsy of the left  liver lesion 01/19/2020-adenocarcinoma, cytokeratin 7+, MSS, tumor mutation burden 1, IDH1 R132C ?MRI abdomen 01/31/2020-poorly marginated central liver mass, multiple smaller similar satellite liver masses, extrinsic mass-effect at the biliary hilum with intrahepatic biliary ductal dilatation throughout the left liver with mild Intermatic dilatation the superior right liver, no pancreas mass or ductal dilatation, normal spleen size, numerous enlarged enhancing lymph nodes at the porta hepatis, peripancreatic, portacaval, aortocaval, left periaortic chains ?Cycle 1 gemcitabine/cisplatin 02/04/2020 ?Cycle 2 gemcitabine/cisplatin 02/28/2020 ?Cycle 3 gemcitabine/cisplatin 03/20/2020 ?CT abdomen/pelvis 03/31/2020-mild decrease in central liver tumor, mild decrease in size of liver metastases and upper abdominal adenopathy, new splenomegaly ?Cycle 4 gemcitabine/cisplatin 04/10/2020 ?Cycle 5 gemcitabine/cisplatin 05/01/2020 ?Cycle 6 gemcitabine/cisplatin plus durvalumab 05/22/2020 ?CTs 06/06/2020- dominant central liver mass mildly enlarged, other liver lesions and abdominal adenopathy is stable, no evidence of metastatic disease to the chest ?Cycle 7 gemcitabine/cisplatin plus Durvalumab 06/12/2020 ?Cycle 8 gemcitabine/cisplatin plus Durvalumab 06/30/2020 ?Cycle 9 gemcitabine/cisplatin plus Durvalumab 07/31/2020 ?CT abdomen/pelvis 08/20/2020-decreased size of dominant central liver mass, slight increase in size and stable additional liver lesions, stable portacaval node ?Cycle 10 gemcitabine/cisplatin plus Durvalumab 08/21/2020 ?Cycle 11 gemcitabine/cisplatin plus Durvalumab 09/11/2020 ?Cycle 12 gemcitabine/cisplatin plus Durvalumab 10/02/2020 ?Cycle 13 gemcitabine/cisplatin plus Durvalumab 10/23/2020 ?CT abdomen/pelvis 11/10/2020-no change in dominant central liver mass and additional liver lesions, stable upper retroperitoneal adenopathy ?Cycle 14 gemcitabine/cisplatin plus Durvalumab 11/13/2020 ?Cycle 15 gemcitabine/cisplatin plus  Durvalumab 12/04/2020 ?Cycle 15 gemcitabine/cisplatin plus Durvalumab 01/08/2021 ?Cycle 16 gemcitabine/cisplatin plus Durvalumab 01/30/2021-cisplatin held from day 1 and day 8 secondary to neuropathy ?CTs 02/15/2021-slight increase in size of central liver  mass, progressive metastatic lesions in the right liver, stable to slightly decreased periportal lymph nodes, progression of splenomegaly, chronically thrombosed left portal vein ?Cycle 1 FOLFOX 03/05/2021 ?Cycle 2 FOLFOX 03/19/2021, oxaliplatin dose reduced due to thrombocytopenia ?Cycle 3 FOLFOX 04/02/2021 ?Cycle 4 FOLFOX 04/16/2021 ?Cycle 5 FOLFOX 04/30/2021 ?  ?Cough-likely related to diaphragmatic irritation from #1 ?Anorexia/weight loss ?Hypercalcemia-likely hypercalcemia malignancy, status post intravenous hydration and Zometa 01/14/2020 ?Diabetes ?Hyperlipidemia ?Hypertension ?History of peripheral neuropathy secondary to diabetes ?Severe back pain following Fulphila-oxycodone prescribed ?Thrombocytopenia secondary to chemotherapy-gemcitabine held with day 1 cycle 8 and then dose reduced ?COVID-19 infection 07/22/2020, 12/25/2020 ?Cisplatin induced peripheral neuropathy ? ?Disposition: Mr. Buxton appears unchanged.  He has completed 4 cycles of FOLFOX.  Plan to proceed with cycle 5 today as scheduled.  Restaging CTs prior to next office visit. ? ?CBC and chemistry panel reviewed.  Labs adequate to proceed with treatment. ? ?He had an episode of epistaxis and also "coughed up" some blood.  He will contact the office if this recurs. ? ?He will return for lab, follow-up, FOLFOX in 2 weeks.  He will contact the office in the interim as outlined above or with any other problems. ? ? ? ?Ned Card ANP/GNP-BC  ? ?04/30/2021  ?10:17 AM ? ? ? ? ? ? ? ?

## 2021-05-01 ENCOUNTER — Other Ambulatory Visit (HOSPITAL_BASED_OUTPATIENT_CLINIC_OR_DEPARTMENT_OTHER): Payer: Self-pay

## 2021-05-02 ENCOUNTER — Inpatient Hospital Stay: Payer: No Typology Code available for payment source

## 2021-05-02 ENCOUNTER — Other Ambulatory Visit: Payer: Self-pay

## 2021-05-02 VITALS — BP 134/83 | HR 65 | Temp 98.6°F | Resp 20

## 2021-05-02 DIAGNOSIS — C221 Intrahepatic bile duct carcinoma: Secondary | ICD-10-CM

## 2021-05-02 DIAGNOSIS — Z5111 Encounter for antineoplastic chemotherapy: Secondary | ICD-10-CM | POA: Diagnosis not present

## 2021-05-02 MED ORDER — HEPARIN SOD (PORK) LOCK FLUSH 100 UNIT/ML IV SOLN
500.0000 [IU] | Freq: Once | INTRAVENOUS | Status: AC | PRN
  Administered 2021-05-02: 500 [IU]

## 2021-05-02 MED ORDER — SODIUM CHLORIDE 0.9% FLUSH
10.0000 mL | INTRAVENOUS | Status: DC | PRN
  Administered 2021-05-02: 10 mL

## 2021-05-02 NOTE — Patient Instructions (Signed)
Heparin injection ?What is this medication? ?HEPARIN (HEP a rin) is an anticoagulant. It is used to treat or prevent clots in the veins, arteries, lungs, or heart. It stops clots from forming or getting bigger. This medicine prevents clotting during open-heart surgery, dialysis, or in patients who are confined to bed. ?This medicine may be used for other purposes; ask your health care provider or pharmacist if you have questions. ?COMMON BRAND NAME(S): Hep-Lock, Hep-Lock U/P, Hepflush-10, Monoject Prefill Advanced Heparin Lock Flush, SASH Normal Saline and Heparin ?What should I tell my care team before I take this medication? ?They need to know if you have any of these conditions: ?bleeding disorders, such as hemophilia or low blood platelets ?bowel disease or diverticulitis ?endocarditis ?high blood pressure ?liver disease ?recent surgery or delivery of a baby ?stomach ulcers ?an unusual or allergic reaction to heparin, benzyl alcohol, sulfites, other medicines, foods, dyes, or preservatives ?pregnant or trying to get pregnant ?breast-feeding ?How should I use this medication? ?This medicine is given by injection or infusion into a vein. It can also be given by injection of small amounts under the skin. It is usually given by a health care professional in a hospital or clinic setting. ?If you get this medicine at home, you will be taught how to prepare and give this medicine. Use exactly as directed. Take your medicine at regular intervals. Do not take it more often than directed. Do not stop taking except on your doctor's advice. Stopping this medicine may increase your risk of a blot clot. Be sure to refill your prescription before you run out of medicine. ?It is important that you put your used needles and syringes in a special sharps container. Do not put them in a trash can. If you do not have a sharps container, call your pharmacist or healthcare provider to get one. ?Talk to your pediatrician regarding the  use of this medicine in children. While this medicine may be prescribed for children for selected conditions, precautions do apply. ?Overdosage: If you think you have taken too much of this medicine contact a poison control center or emergency room at once. ?NOTE: This medicine is only for you. Do not share this medicine with others. ?What if I miss a dose? ?If you miss a dose, take it as soon as you can. If it is almost time for your next dose, take only that dose. Do not take double or extra doses. ?What may interact with this medication? ?Do not take this medicine with any of the following medications: ?aspirin and aspirin-like drugs ?mifepristone ?medicines that treat or prevent blood clots like warfarin, enoxaparin, and dalteparin ?palifermin ?protamine ?This medicine may also interact with the following medications: ?dextran ?digoxin ?hydroxychloroquine ?medicines for treating colds or allergies ?nicotine ?NSAIDs, medicines for pain and inflammation, like ibuprofen or naproxen ?phenylbutazone ?tetracycline antibiotics ?This list may not describe all possible interactions. Give your health care provider a list of all the medicines, herbs, non-prescription drugs, or dietary supplements you use. Also tell them if you smoke, drink alcohol, or use illegal drugs. Some items may interact with your medicine. ?What should I watch for while using this medication? ?Visit your healthcare professional for regular checks on your progress. You may need blood work done while you are taking this medicine. Your condition will be monitored carefully while you are receiving this medicine. It is important not to miss any appointments. ?Wear a medical ID bracelet or chain, and carry a card that describes your disease and details   of your medicine and dosage times. ?Notify your doctor or healthcare professional at once if you have cold, blue hands or feet. ?If you are going to need surgery or other procedure, tell your healthcare  professional that you are using this medicine. ?Avoid sports and activities that might cause injury while you are using this medicine. Severe falls or injuries can cause unseen bleeding. Be careful when using sharp tools or knives. Consider using an Copy. Take special care brushing or flossing your teeth. Report any injuries, bruising, or red spots on the skin to your healthcare professional. ?Using this medicine for a long time may weaken your bones and increase the risk of bone fractures. ?You should make sure that you get enough calcium and vitamin D while you are taking this medicine. Discuss the foods you eat and the vitamins you take with your healthcare professional. ?Wear a medical ID bracelet or chain. Carry a card that describes your disease and details of your medicine and dosage times. ?What side effects may I notice from receiving this medication? ?Side effects that you should report to your doctor or health care professional as soon as possible: ?allergic reactions like skin rash, itching or hives, swelling of the face, lips, or tongue ?bone pain ?fever, chills ?nausea, vomiting ?signs and symptoms of bleeding such as bloody or black, tarry stools; red or dark-brown urine; spitting up blood or brown material that looks like coffee grounds; red spots on the skin; unusual bruising or bleeding from the eye, gums, or nose ?signs and symptoms of a blood clot such as chest pain; shortness of breath; pain, swelling, or warmth in the leg ?signs and symptoms of a stroke such as changes in vision; confusion; trouble speaking or understanding; severe headaches; sudden numbness or weakness of the face, arm or leg; trouble walking; dizziness; loss of coordination ?Side effects that usually do not require medical attention (report to your doctor or health care professional if they continue or are bothersome): ?hair loss ?pain, redness, or irritation at site where injected ?This list may not describe all  possible side effects. Call your doctor for medical advice about side effects. You may report side effects to FDA at 1-800-FDA-1088. ?Where should I keep my medication? ?Keep out of the reach of children. ?Store unopened vials at room temperature between 15 and 30 degrees C (59 and 86 degrees F). Do not freeze. Do not use if solution is discolored or particulate matter is present. Throw away any unused medicine after the expiration date. ?NOTE: This sheet is a summary. It may not cover all possible information. If you have questions about this medicine, talk to your doctor, pharmacist, or health care provider. ?? 2022 Elsevier/Gold Standard (2020-03-02 00:00:00) ? ?

## 2021-05-08 ENCOUNTER — Ambulatory Visit (INDEPENDENT_AMBULATORY_CARE_PROVIDER_SITE_OTHER): Payer: No Typology Code available for payment source | Admitting: Family Medicine

## 2021-05-08 ENCOUNTER — Other Ambulatory Visit: Payer: Self-pay | Admitting: Family Medicine

## 2021-05-08 ENCOUNTER — Encounter: Payer: Self-pay | Admitting: Family Medicine

## 2021-05-08 ENCOUNTER — Other Ambulatory Visit (HOSPITAL_BASED_OUTPATIENT_CLINIC_OR_DEPARTMENT_OTHER): Payer: Self-pay

## 2021-05-08 VITALS — BP 142/100 | HR 69 | Temp 97.3°F | Ht 73.0 in | Wt 240.2 lb

## 2021-05-08 DIAGNOSIS — E1165 Type 2 diabetes mellitus with hyperglycemia: Secondary | ICD-10-CM

## 2021-05-08 DIAGNOSIS — E133299 Other specified diabetes mellitus with mild nonproliferative diabetic retinopathy without macular edema, unspecified eye: Secondary | ICD-10-CM

## 2021-05-08 DIAGNOSIS — Z0000 Encounter for general adult medical examination without abnormal findings: Secondary | ICD-10-CM | POA: Diagnosis not present

## 2021-05-08 DIAGNOSIS — E782 Mixed hyperlipidemia: Secondary | ICD-10-CM

## 2021-05-08 DIAGNOSIS — I10 Essential (primary) hypertension: Secondary | ICD-10-CM

## 2021-05-08 DIAGNOSIS — C221 Intrahepatic bile duct carcinoma: Secondary | ICD-10-CM

## 2021-05-08 DIAGNOSIS — R053 Chronic cough: Secondary | ICD-10-CM

## 2021-05-08 DIAGNOSIS — Z794 Long term (current) use of insulin: Secondary | ICD-10-CM

## 2021-05-08 MED ORDER — ALPRAZOLAM 0.5 MG PO TABS
0.5000 mg | ORAL_TABLET | Freq: Three times a day (TID) | ORAL | 0 refills | Status: DC | PRN
  Filled 2021-05-08: qty 30, 10d supply, fill #0

## 2021-05-08 NOTE — Progress Notes (Signed)
? ?Subjective:  ? ? Patient ID: Matthew Osborne, male    DOB: Oct 03, 1975, 46 y.o.   MRN: [id number] ? ?HPI ?I have not seen the patient since 1/22.  I have copied his most recent A/P from oncology related to the treatment of his cholangiocarcinoma from 04/30/21 below for reference: ? ?Disposition: Matthew Osborne appears unchanged.  He has completed 4 cycles of FOLFOX.  Plan to proceed with cycle 5 today as scheduled.  Restaging CTs prior to next office visit. ? ?Review of his most recent blood sugars on random lab work for sugars greater than 250.  Patient is here today with his wife.  He is clearly emotional.  He breaks down crying at 1 point during our encounter.  His wife reports trouble sleeping particularly when he gets the Decadron.  On chemo days he is taking upwards of 300 to 400 units of insulin.  He will take 100 units of long-acting insulin twice daily and the rapid acting insulin with sliding scale.  On non chemo days he is taking 250 units of insulin total.  His sugars on chemo days can be in the 500s.  His sugars on normal days can be 2 50-300.  He denies any hypoglycemic episodes.  The low sugar that he is seeing is around 150.  He has constant neuropathy in his feet and his hands.  He reports difficulty with his balance just trying to walk. ?Past Medical History:  ?Diagnosis Date  ? Cancer [facility])   ? De Quervain's tenosynovitis, left 03/2011  ? Diabetes mellitus   ? NIDDM  ? Family history of breast cancer   ? Family history of Hodgkin's lymphoma   ? Hyperlipidemia   ? Hypertension   ? under control; has been on med. x 4 yrs.  ? Nonproliferative retinopathy due to secondary diabetes ([place])   ? ?Past Surgical History:  ?Procedure Laterality Date  ? CARPAL TUNNEL RELEASE    ? bilat.  ? DORSAL COMPARTMENT RELEASE  03/14/2011  ? Procedure: RELEASE DORSAL COMPARTMENT (DEQUERVAIN);  Surgeon: Matthew Floor, MD;  Location: [place];  Service: Orthopedics;  Laterality: Left;  left wrist APL and EPL  tenosynovectomy and 1st dorsal compartment release  ? IR IMAGING GUIDED PORT INSERTION  02/01/2020  ? TONSILLECTOMY AND ADENOIDECTOMY  as a child  ? VASECTOMY    ? ?Current Outpatient Medications on File Prior to Visit  ?Medication Sig Dispense Refill  ? bisoprolol-hydrochlorothiazide (ZIAC) 10-6.25 MG tablet TAKE 1 TABLET BY MOUTH EVERY DAY IN THE MORNING 30 tablet 0  ? blood glucose meter kit and supplies KIT Fasting prior to each meal three times a day 1 each 0  ? Continuous Blood Gluc Sensor (DEXCOM G6 SENSOR) MISC Use as directed to check blood sugar 5-6 times daily. 9 each 0  ? Continuous Blood Gluc Transmit (DEXCOM G6 TRANSMITTER) MISC USE AS DIRECTED TO CHECK BLOOD SUGAR 5-6 TIMES DAILY *needs office visit* 1 each 1  ? Continuous Blood Gluc Transmit (DEXCOM G6 TRANSMITTER) MISC USE AS DIRECTED TO CHECK BLOOD SUGAR 5-6 TIMES DAILY *needs office visit* 1 each 1  ? glucose blood test strip Dispense based on patient and insurance preference.  Use twice daily as directed. (FOR ICD-10 E11.65) 100 each 11  ? ibuprofen (ADVIL) 200 MG tablet Take 800 mg by mouth every 4 (four) hours as needed for fever.    ? insulin glargine-yfgn (SEMGLEE, YFGN,) 100 UNIT/ML Pen Inject 70 Units into the skin 2 (two)  times daily. (Patient taking differently: Inject 60-90 Units into the skin See admin instructions. Inject 90 units subcutaneously twice daily on chemo day and for 3 days therafter; inject 60 units twice daily on non-chemo days) 30 mL 11  ? insulin lispro (HUMALOG KWIKPEN) 100 UNIT/ML KwikPen CHECK BG PRIOR TO MEALS, IF BG IS 150-200 inject 2U, BG 201-250 inject 4U, 251-300 inject 6U, 301-350 inject 8U, 351-400 inject 10U. OVER 401 inject 12U & CALL MD 15 mL 0  ? Insulin Pen Needle (UNIFINE PENTIPS) 31G X 5 MM MISC 1 each by Other route 5 (five) times daily. 200 each 11  ? Insulin Syringe 27G X 1/2" 0.5 ML MISC Use as directed to inject insulin SQ Q1D. 100 each 3  ? Lancets Thin MISC Check BS BID 100 each 5  ?  lidocaine-prilocaine (EMLA) cream APPLY 1 APPLICATION TOPICALLY AS DIRECTED. APPLY TO PORT SITE 1-2 HOURS PRIOR TO STICK AND COVER WITH PLASTIC WRAP. 30 g 2  ? LORazepam (ATIVAN) 0.5 MG tablet Take 1 tablet (0.5 mg total) by mouth at bedtime as needed for anxiety. (Patient taking differently: Take 0.5 mg by mouth at bedtime as needed (anxiety/agitation after chemo treatments).) 30 tablet 0  ? ondansetron (ZOFRAN-ODT) 8 MG disintegrating tablet Take 1 tablet (8 mg total) by mouth every 8 (eight) hours as needed for nausea or vomiting. 30 tablet 1  ? prochlorperazine (COMPAZINE) 10 MG tablet TAKE 1 TABLET BY MOUTH EVERY 6 HOURS AS NEEDED FOR NAUSEA 60 tablet 1  ? traMADol (ULTRAM) 50 MG tablet TAKE 1 TABLET BY MOUTH EVERY 6 HOURS AS NEEDED FOR MODERATE PAIN (COUGH). 30 tablet 0  ? TRUEPLUS LANCETS 33G MISC 1 each by Does not apply route 2 (two) times daily. 100 each 11  ? ?Current Facility-Administered Medications on File Prior to Visit  ?Medication Dose Route Frequency Provider Last Rate Last Admin  ? sodium chloride flush (NS) 0.9 % injection 10 mL  10 mL Intravenous PRN Ladell Pier, MD   10 mL at 02/16/21 1034  ? ? ? ?Allergies  ?Allergen Reactions  ? Niaspan [Niacin] Other (See Comments)  ?  Flushing ?  ? ?Social History  ? ?Socioeconomic History  ? Marital status: Married  ?  Spouse name: Not on file  ? Number of children: Not on file  ? Years of education: Not on file  ? Highest education level: Not on file  ?Occupational History  ? Not on file  ?Tobacco Use  ? Smoking status: Former  ?  Packs/day: 3.00  ?  Years: 6.00  ?  Pack years: 18.00  ?  Types: Cigarettes  ?  Quit date: 02/05/1999  ?  Years since quitting: 22.2  ? Smokeless tobacco: Never  ?Vaping Use  ? Vaping Use: Never used  ?Substance and Sexual Activity  ? Alcohol use: No  ?  Alcohol/week: 0.0 standard drinks  ? Drug use: No  ? Sexual activity: Not on file  ?Other Topics Concern  ? Not on file  ?Social History Narrative  ? Not on file  ? ?Social  Determinants of Health  ? ?Financial Resource Strain: Not on file  ?Food Insecurity: No Food Insecurity  ? Worried About Charity fundraiser in the Last Year: Never true  ? Ran Out of Food in the Last Year: Never true  ?Transportation Needs: No Transportation Needs  ? Lack of Transportation (Medical): No  ? Lack of Transportation (Non-Medical): No  ?Physical Activity: Not on file  ?Stress:  Not on file  ?Social Connections: Not on file  ?Intimate Partner Violence: Not on file  ? ? ? ?Review of Systems  ?All other systems reviewed and are negative. ? ?   ?Objective:  ? Physical Exam ?Vitals reviewed.  ?Constitutional:   ?   Appearance: Normal appearance.  ?Eyes:  ?   Extraocular Movements: Extraocular movements intact.  ?   Conjunctiva/sclera: Conjunctivae normal.  ?   Pupils: Pupils are equal, round, and reactive to light.  ?Neck:  ?   Vascular: No carotid bruit.  ?Cardiovascular:  ?   Rate and Rhythm: Normal rate and regular rhythm.  ?   Pulses: Normal pulses.  ?   Heart sounds: Normal heart sounds. No murmur heard. ?  No friction rub. No gallop.  ?Pulmonary:  ?   Effort: Pulmonary effort is normal. No respiratory distress.  ?   Breath sounds: Normal breath sounds. No wheezing, rhonchi or rales.  ?Musculoskeletal:  ?   Right lower leg: No edema.  ?   Left lower leg: No edema.  ?Skin: ?   Findings: No erythema or rash.  ?Neurological:  ?   General: No focal deficit present.  ?   Mental Status: He is alert and oriented to person, place, and time.  ?   Cranial Nerves: No cranial nerve deficit.  ?   Motor: No weakness.  ?   Coordination: Coordination normal.  ?   Gait: Gait normal.  ?Psychiatric:     ?   Mood and Affect: Mood is depressed. Affect is tearful.  ? ? ? ? ? ?   ?Assessment & Plan:  ?General medical exam ? ?Type 2 diabetes mellitus with hyperglycemia, with long-term current use of insulin (HCC) - Plan: CBC with Differential/Platelet, Lipid panel, Hemoglobin A1c ? ?Cholangiocarcinoma ([place]) ? ?Essential  hypertension, benign ? ?Mixed hyperlipidemia ? ?Nonproliferative retinopathy due to secondary diabetes ([employer]) ? ?Chronic cough ? ?Hypercalcemia ?I stopped his physical exam and spent the majority of time talking with the

## 2021-05-09 ENCOUNTER — Other Ambulatory Visit (HOSPITAL_BASED_OUTPATIENT_CLINIC_OR_DEPARTMENT_OTHER): Payer: Self-pay

## 2021-05-09 LAB — CBC WITH DIFFERENTIAL/PLATELET
Absolute Monocytes: 515 cells/uL (ref 200–950)
Basophils Absolute: 62 cells/uL (ref 0–200)
Basophils Relative: 1.4 %
Eosinophils Absolute: 264 cells/uL (ref 15–500)
Eosinophils Relative: 6 %
HCT: 36.4 % — ABNORMAL LOW (ref 38.5–50.0)
Hemoglobin: 12.2 g/dL — ABNORMAL LOW (ref 13.2–17.1)
Lymphs Abs: 1179 cells/uL (ref 850–3900)
MCH: 33.7 pg — ABNORMAL HIGH (ref 27.0–33.0)
MCHC: 33.5 g/dL (ref 32.0–36.0)
MCV: 100.6 fL — ABNORMAL HIGH (ref 80.0–100.0)
MPV: 11 fL (ref 7.5–12.5)
Monocytes Relative: 11.7 %
Neutro Abs: 2380 cells/uL (ref 1500–7800)
Neutrophils Relative %: 54.1 %
Platelets: 99 10*3/uL — ABNORMAL LOW (ref 140–400)
RBC: 3.62 10*6/uL — ABNORMAL LOW (ref 4.20–5.80)
RDW: 14.5 % (ref 11.0–15.0)
Total Lymphocyte: 26.8 %
WBC: 4.4 10*3/uL (ref 3.8–10.8)

## 2021-05-09 LAB — HEMOGLOBIN A1C
Hgb A1c MFr Bld: 7.9 % of total Hgb — ABNORMAL HIGH (ref <5.7)
Mean Plasma Glucose: 180 mg/dL
eAG (mmol/L): 10 mmol/L

## 2021-05-09 LAB — LIPID PANEL
Cholesterol: 147 mg/dL (ref <200)
HDL: 28 mg/dL — ABNORMAL LOW (ref >=40)
LDL Cholesterol (Calc): 99 mg/dL (calc)
Non-HDL Cholesterol (Calc): 119 mg/dL (calc) (ref <130)
Total CHOL/HDL Ratio: 5.3 (calc) — ABNORMAL HIGH (ref <5.0)
Triglycerides: 102 mg/dL (ref <150)

## 2021-05-09 MED ORDER — INSULIN LISPRO (1 UNIT DIAL) 100 UNIT/ML (KWIKPEN)
PEN_INJECTOR | SUBCUTANEOUS | 5 refills | Status: DC
  Filled 2021-05-09: qty 15, 41d supply, fill #0

## 2021-05-09 MED ORDER — BISOPROLOL-HYDROCHLOROTHIAZIDE 10-6.25 MG PO TABS
ORAL_TABLET | ORAL | 0 refills | Status: DC
Start: 2021-05-09 — End: 2021-07-06
  Filled 2021-05-09 – 2021-05-28 (×2): qty 30, 30d supply, fill #0

## 2021-05-09 MED ORDER — TRAMADOL HCL 50 MG PO TABS: ORAL_TABLET | ORAL | 5 refills | Status: DC

## 2021-05-09 NOTE — Telephone Encounter (Signed)
Semglee sent 08/2020 with 11 refills ?Humalog sent 05/09/21 ?Ziac sent 05/09/21 ? ?Tramadol requested. ? ?Patient received Dexcom G6 CGM 04/30/21. Do not think insurance will cover standard meter in addition to this. ? ?Referral to Dr. Chalmers Cater placed.  ? ?Please advise, thanks! ?

## 2021-05-10 ENCOUNTER — Ambulatory Visit (HOSPITAL_BASED_OUTPATIENT_CLINIC_OR_DEPARTMENT_OTHER)
Admission: RE | Admit: 2021-05-10 | Discharge: 2021-05-10 | Disposition: A | Discharge status: Home / Self Care | Payer: No Typology Code available for payment source | Source: Ambulatory Visit | Attending: Nurse Practitioner | Admitting: Nurse Practitioner

## 2021-05-10 ENCOUNTER — Other Ambulatory Visit (HOSPITAL_BASED_OUTPATIENT_CLINIC_OR_DEPARTMENT_OTHER): Payer: Self-pay

## 2021-05-10 ENCOUNTER — Encounter (HOSPITAL_BASED_OUTPATIENT_CLINIC_OR_DEPARTMENT_OTHER): Payer: Self-pay

## 2021-05-10 DIAGNOSIS — C221 Intrahepatic bile duct carcinoma: Secondary | ICD-10-CM | POA: Insufficient documentation

## 2021-05-10 MED ORDER — TRAMADOL HCL 50 MG PO TABS
ORAL_TABLET | ORAL | 5 refills | Status: DC
  Filled 2021-05-10: qty 30, 7d supply, fill #0
  Filled 2021-07-06: qty 30, 7d supply, fill #1
  Filled 2021-07-27: qty 30, 7d supply, fill #2

## 2021-05-10 MED ORDER — IOHEXOL 300 MG/ML  SOLN
100.0000 mL | Freq: Once | INTRAMUSCULAR | Status: AC | PRN
  Administered 2021-05-10: 100 mL via INTRAVENOUS

## 2021-05-13 ENCOUNTER — Other Ambulatory Visit: Payer: Self-pay | Admitting: Oncology

## 2021-05-14 ENCOUNTER — Other Ambulatory Visit (HOSPITAL_BASED_OUTPATIENT_CLINIC_OR_DEPARTMENT_OTHER): Payer: Self-pay

## 2021-05-14 ENCOUNTER — Inpatient Hospital Stay: Payer: No Typology Code available for payment source

## 2021-05-14 ENCOUNTER — Inpatient Hospital Stay: Payer: No Typology Code available for payment source | Attending: Oncology

## 2021-05-14 ENCOUNTER — Other Ambulatory Visit (HOSPITAL_COMMUNITY): Payer: Self-pay

## 2021-05-14 ENCOUNTER — Other Ambulatory Visit: Payer: Self-pay

## 2021-05-14 ENCOUNTER — Inpatient Hospital Stay (HOSPITAL_BASED_OUTPATIENT_CLINIC_OR_DEPARTMENT_OTHER): Payer: No Typology Code available for payment source | Admitting: Oncology

## 2021-05-14 VITALS — BP 125/84 | HR 58 | Temp 98.0°F | Resp 20 | Ht 73.0 in | Wt 240.5 lb

## 2021-05-14 DIAGNOSIS — R059 Cough, unspecified: Secondary | ICD-10-CM | POA: Diagnosis not present

## 2021-05-14 DIAGNOSIS — D6959 Other secondary thrombocytopenia: Secondary | ICD-10-CM | POA: Diagnosis not present

## 2021-05-14 DIAGNOSIS — I10 Essential (primary) hypertension: Secondary | ICD-10-CM | POA: Diagnosis not present

## 2021-05-14 DIAGNOSIS — E785 Hyperlipidemia, unspecified: Secondary | ICD-10-CM | POA: Diagnosis not present

## 2021-05-14 DIAGNOSIS — R162 Hepatomegaly with splenomegaly, not elsewhere classified: Secondary | ICD-10-CM | POA: Insufficient documentation

## 2021-05-14 DIAGNOSIS — M549 Dorsalgia, unspecified: Secondary | ICD-10-CM | POA: Insufficient documentation

## 2021-05-14 DIAGNOSIS — E1142 Type 2 diabetes mellitus with diabetic polyneuropathy: Secondary | ICD-10-CM | POA: Diagnosis not present

## 2021-05-14 DIAGNOSIS — R634 Abnormal weight loss: Secondary | ICD-10-CM | POA: Insufficient documentation

## 2021-05-14 DIAGNOSIS — C221 Intrahepatic bile duct carcinoma: Secondary | ICD-10-CM

## 2021-05-14 LAB — CBC WITH DIFFERENTIAL (CANCER CENTER ONLY)
Abs Immature Granulocytes: 0 10*3/uL (ref 0.00–0.07)
Basophils Absolute: 0.1 10*3/uL (ref 0.0–0.1)
Basophils Relative: 2 %
Eosinophils Absolute: 0.2 10*3/uL (ref 0.0–0.5)
Eosinophils Relative: 6 %
HCT: 41.3 % (ref 39.0–52.0)
Hemoglobin: 13.9 g/dL (ref 13.0–17.0)
Immature Granulocytes: 0 %
Lymphocytes Relative: 29 %
Lymphs Abs: 1.2 10*3/uL (ref 0.7–4.0)
MCH: 33.7 pg (ref 26.0–34.0)
MCHC: 33.7 g/dL (ref 30.0–36.0)
MCV: 100 fL (ref 80.0–100.0)
Monocytes Absolute: 0.7 10*3/uL (ref 0.1–1.0)
Monocytes Relative: 16 %
Neutro Abs: 2 10*3/uL (ref 1.7–7.7)
Neutrophils Relative %: 47 %
Platelet Count: 128 10*3/uL — ABNORMAL LOW (ref 150–400)
RBC: 4.13 MIL/uL — ABNORMAL LOW (ref 4.22–5.81)
RDW: 15.7 % — ABNORMAL HIGH (ref 11.5–15.5)
WBC Count: 4.1 10*3/uL (ref 4.0–10.5)
nRBC: 0 % (ref 0.0–0.2)

## 2021-05-14 LAB — CMP (CANCER CENTER ONLY)
ALT: 29 U/L (ref 0–44)
AST: 82 U/L — ABNORMAL HIGH (ref 15–41)
Albumin: 3.7 g/dL (ref 3.5–5.0)
Alkaline Phosphatase: 135 U/L — ABNORMAL HIGH (ref 38–126)
Anion gap: 5 (ref 5–15)
BUN: 18 mg/dL (ref 6–20)
CO2: 26 mmol/L (ref 22–32)
Calcium: 10.8 mg/dL — ABNORMAL HIGH (ref 8.9–10.3)
Chloride: 100 mmol/L (ref 98–111)
Creatinine: 1.15 mg/dL (ref 0.61–1.24)
GFR, Estimated: >60 mL/min (ref >60)
Glucose, Bld: 242 mg/dL — ABNORMAL HIGH (ref 70–99)
Potassium: 4.7 mmol/L (ref 3.5–5.1)
Sodium: 131 mmol/L — ABNORMAL LOW (ref 135–145)
Total Bilirubin: 0.9 mg/dL (ref 0.3–1.2)
Total Protein: 7.8 g/dL (ref 6.5–8.1)

## 2021-05-14 LAB — MAGNESIUM: Magnesium: 2.2 mg/dL (ref 1.7–2.4)

## 2021-05-14 MED ORDER — IVOSIDENIB 250 MG PO TABS
500.0000 mg | ORAL_TABLET | Freq: Every day | ORAL | 1 refills | Status: DC
  Filled 2021-05-14: qty 60, fill #0

## 2021-05-14 NOTE — Progress Notes (Signed)
?[place] ?OFFICE PROGRESS NOTE ? ? ?Diagnosis: Cholangiocarcinoma ? ?INTERVAL HISTORY:  ? ?Matthew Osborne completed another cycle of FOLFOX on 04/30/2021.  No nausea/vomiting, mouth sores, or diarrhea.  He complains of malaise.  He has a cough. ? ?Objective: ? ?Vital signs in last 24 hours: ? ?Blood pressure 125/84, pulse (!) 58, temperature 98 ?F (36.7 ?C), temperature source Oral, resp. rate 20, height $RemoveBe'6\' 1"'BMdLYuVxD$  (1.854 m), weight 240 lb 8 oz (109.1 kg), SpO2 99 %. ?  ? ?HEENT: No thrush or ulcers ?Resp: Lungs clear bilaterally ?Cardio: Regular rate and rhythm ?GI: No hepatosplenomegaly ?Vascular: No leg edema  ?  ? ?Portacath/PICC-without erythema ? ?Lab Results: ? ?Lab Results  ?Component Value Date  ? WBC 4.1 05/14/2021  ? HGB 13.9 05/14/2021  ? HCT 41.3 05/14/2021  ? MCV 100.0 05/14/2021  ? PLT 128 (L) 05/14/2021  ? NEUTROABS 2.0 05/14/2021  ? ? ?CMP  ?Lab Results  ?Component Value Date  ? NA 131 (L) 05/14/2021  ? K 4.7 05/14/2021  ? CL 100 05/14/2021  ? CO2 26 05/14/2021  ? GLUCOSE 242 (H) 05/14/2021  ? BUN 18 05/14/2021  ? CREATININE 1.15 05/14/2021  ? CALCIUM 10.8 (H) 05/14/2021  ? PROT 7.8 05/14/2021  ? ALBUMIN 3.7 05/14/2021  ? AST 82 (H) 05/14/2021  ? ALT 29 05/14/2021  ? ALKPHOS 135 (H) 05/14/2021  ? BILITOT 0.9 05/14/2021  ? GFRNONAA >60 05/14/2021  ? GFRAA 127 01/11/2020  ? ? ?Lab Results  ?Component Value Date  ? CEA1 2.15 01/14/2020  ? [id number] 82 (H) 08/21/2020  ? ? ?Lab Results  ?Component Value Date  ? INR 1.1 01/19/2020  ? LABPROT 13.3 01/19/2020  ? ? ?Imaging: ? ?No results found. ? ?Medications: I have reviewed the patient's current medications. ? ? ?Assessment/Plan: ?Cholangiocarcinoma  multiple liver masses and abdominal lymphadenopathy ?CTs 01/13/2020-rounded hypodense mass appears to arise from the pancreas neck, multiple rim-enhancing masses in the liver, primarily left liver with segmental dilation of the left lobe Intermatic bile ducts, ill-defined hypodensity the central liver  with effacement of the left portal vein, enlarged portacaval and retroperitoneal lymph nodes ?Ultrasound-guided biopsy of the left liver lesion 01/19/2020-adenocarcinoma, cytokeratin 7+, MSS, tumor mutation burden 1, IDH1 R132C ?MRI abdomen 01/31/2020-poorly marginated central liver mass, multiple smaller similar satellite liver masses, extrinsic mass-effect at the biliary hilum with intrahepatic biliary ductal dilatation throughout the left liver with mild Intermatic dilatation the superior right liver, no pancreas mass or ductal dilatation, normal spleen size, numerous enlarged enhancing lymph nodes at the porta hepatis, peripancreatic, portacaval, aortocaval, left periaortic chains ?Cycle 1 gemcitabine/cisplatin 02/04/2020 ?Cycle 2 gemcitabine/cisplatin 02/28/2020 ?Cycle 3 gemcitabine/cisplatin 03/20/2020 ?CT abdomen/pelvis 03/31/2020-mild decrease in central liver tumor, mild decrease in size of liver metastases and upper abdominal adenopathy, new splenomegaly ?Cycle 4 gemcitabine/cisplatin 04/10/2020 ?Cycle 5 gemcitabine/cisplatin 05/01/2020 ?Cycle 6 gemcitabine/cisplatin plus durvalumab 05/22/2020 ?CTs 06/06/2020- dominant central liver mass mildly enlarged, other liver lesions and abdominal adenopathy is stable, no evidence of metastatic disease to the chest ?Cycle 7 gemcitabine/cisplatin plus Durvalumab 06/12/2020 ?Cycle 8 gemcitabine/cisplatin plus Durvalumab 06/30/2020 ?Cycle 9 gemcitabine/cisplatin plus Durvalumab 07/31/2020 ?CT abdomen/pelvis 08/20/2020-decreased size of dominant central liver mass, slight increase in size and stable additional liver lesions, stable portacaval node ?Cycle 10 gemcitabine/cisplatin plus Durvalumab 08/21/2020 ?Cycle 11 gemcitabine/cisplatin plus Durvalumab 09/11/2020 ?Cycle 12 gemcitabine/cisplatin plus Durvalumab 10/02/2020 ?Cycle 13 gemcitabine/cisplatin plus Durvalumab 10/23/2020 ?CT abdomen/pelvis 11/10/2020-no change in dominant central liver mass and additional liver lesions, stable  upper retroperitoneal adenopathy ?Cycle 14 gemcitabine/cisplatin plus  Durvalumab 11/13/2020 ?Cycle 15 gemcitabine/cisplatin plus Durvalumab 12/04/2020 ?Cycle 15 gemcitabine/cisplatin plus Durvalumab 01/08/2021 ?Cycle 16 gemcitabine/cisplatin plus Durvalumab 01/30/2021-cisplatin held from day 1 and day 8 secondary to neuropathy ?CTs 02/15/2021-slight increase in size of central liver mass, progressive metastatic lesions in the right liver, stable to slightly decreased periportal lymph nodes, progression of splenomegaly, chronically thrombosed left portal vein ?Cycle 1 FOLFOX 03/05/2021 ?Cycle 2 FOLFOX 03/19/2021, oxaliplatin dose reduced due to thrombocytopenia ?Cycle 3 FOLFOX 04/02/2021 ?Cycle 4 FOLFOX 04/16/2021 ?Cycle 5 FOLFOX 04/30/2021 ?CTs 05/10/2021-new and enlarging liver metastases, chronic left lobe biliary dilatation, chronic left portal vein thrombosis, stable porta hepatis adenopathy, splenomegaly, no evidence of metastatic disease to the chest ?  ?Cough-likely related to diaphragmatic irritation from #1 ?Anorexia/weight loss ?Hypercalcemia-likely hypercalcemia malignancy, status post intravenous hydration and Zometa 01/14/2020 ?Diabetes ?Hyperlipidemia ?Hypertension ?History of peripheral neuropathy secondary to diabetes ?Severe back pain following Fulphila-oxycodone prescribed ?Thrombocytopenia secondary to chemotherapy-gemcitabine held with day 1 cycle 8 and then dose reduced ?COVID-19 infection 07/22/2020, 12/25/2020 ?Cisplatin induced peripheral neuropathy ? ? ? ?Disposition: ? Matthew Osborne appears stable.  The restaging CTs reveal evidence of progressive metastatic disease in the liver.  I reviewed the CT findings and images with Matthew. Rankin and his wife.  We discussed treatment options.  FOLFOX will be discontinued. ?An IDH 1 mutation was identified on the liver biopsy specimen.  I recommend treatment with ivosidenib.  He understands no therapy will be curative.  The goal of treatment is to late disease  progression and potentially prolong survival.  We reviewed potential toxicities associated with ivosidenib including the chance of nausea, diarrhea, electrolyte abnormalities, hypertension, edema, arrhythmia, rash, and other potential toxicities.  He was given reading materials on ivosidenib. ? ?We also discussed possible hepatic directed therapy and salvage chemotherapy in the future. ? ?The plan is to begin ivosidenib as soon as he has insurance approval.  We will obtain a baseline EKG today.  He will return for an office visit and Zometa on 05/28/2021. ? ? ? ?Betsy Coder, MD ? ?05/14/2021  ?10:33 AM ? ? ?

## 2021-05-15 ENCOUNTER — Telehealth: Payer: Self-pay | Admitting: Pharmacist

## 2021-05-15 ENCOUNTER — Other Ambulatory Visit (HOSPITAL_BASED_OUTPATIENT_CLINIC_OR_DEPARTMENT_OTHER): Payer: Self-pay

## 2021-05-15 ENCOUNTER — Other Ambulatory Visit (HOSPITAL_COMMUNITY): Payer: Self-pay

## 2021-05-15 ENCOUNTER — Encounter: Payer: Self-pay | Admitting: Oncology

## 2021-05-15 ENCOUNTER — Encounter: Payer: Self-pay | Admitting: *Deleted

## 2021-05-15 ENCOUNTER — Telehealth: Payer: Self-pay | Admitting: Pharmacy Technician

## 2021-05-15 ENCOUNTER — Encounter (HOSPITAL_BASED_OUTPATIENT_CLINIC_OR_DEPARTMENT_OTHER): Payer: Self-pay

## 2021-05-15 DIAGNOSIS — C221 Intrahepatic bile duct carcinoma: Secondary | ICD-10-CM

## 2021-05-15 MED ORDER — INSULIN LISPRO 100 UNIT/ML IJ SOLN
INTRAMUSCULAR | 4 refills | Status: AC
  Filled 2021-05-15: qty 60, 30d supply, fill #0
  Filled 2021-07-06: qty 60, 30d supply, fill #1
  Filled 2021-08-06: qty 60, 30d supply, fill #2
  Filled 2021-09-07: qty 60, 30d supply, fill #3
  Filled 2021-10-18: qty 60, 30d supply, fill #4

## 2021-05-15 MED ORDER — OMNIPOD 5 DEXG7G6 INTRO GEN 5 KIT
PACK | 1 refills | Status: DC
  Filled 2021-05-15: qty 1, 30d supply, fill #0

## 2021-05-15 MED ORDER — BAQSIMI TWO PACK 3 MG/DOSE NA POWD
NASAL | 6 refills | Status: AC
  Filled 2021-05-15: qty 2, 1d supply, fill #0

## 2021-05-15 MED ORDER — OMNIPOD 5 DEXG7G6 PODS GEN 5 MISC
6 refills | Status: DC
  Filled 2021-05-15: qty 15, 30d supply, fill #0
  Filled 2021-05-16: qty 10, 30d supply, fill #0

## 2021-05-15 NOTE — Telephone Encounter (Signed)
Oral Oncology Pharmacist Encounter ? ?Received new prescription for Tibsovo (ivosidenib) for the treatment of metastatic cholangiocarcinoma, IDH1 R132C mutation positive ([facility], 02/08/20), planned duration until disease progression or unacceptable drug toxicity. ? ?CBC/CMP from 05/14/21 assessed, no relevant lab abnormalities. Prescription dose and frequency assessed.  ? ?Monitoring:  ?ECG: recommended at least once weekly for the first 3 weeks of therapy and then at least once monthly for the duration of therapy ?Patient has a recent ECG from 05/14/21 ?Creatine phosphokinase: Weekly for the first month of therapy ? ?Current medication list in Epic reviewed, one DDIs with ivosidenib identified: ?Ondansetron: High risk QT-prolonging agents, such as ivosidenib, may enhance the QTc-prolonging effect of ondansetron, recommend against ondansetron use while on ivosidenib ? ?Evaluated chart and no patient barriers to medication adherence identified.  ? ?Prescription has been e-scribed to the [facility] for benefits analysis and approval. ? ?Oral Oncology Clinic will continue to follow for insurance authorization, copayment issues, initial counseling and start date. ? ?Patient agreed to treatment on 05/14/21 per MD documentation. ? ?Darl Pikes, PharmD, BCPS, BCOP, CPP ?Hematology/Oncology Clinical Pharmacist Practitioner ?[facility] ?[phone] ? ?05/15/2021 3:04 PM  ?

## 2021-05-15 NOTE — Telephone Encounter (Signed)
Oral Oncology Patient Advocate Encounter ? ?Prior Authorization for Tibsovo has been approved.   ? ?PA# [id number] ?Effective dates: 05/15/21 through 05/15/22 ? ?Patients co-pay is $250.00.  Obtained a copay card to reduce OOP to $25/mth. ? ?Oral Oncology Clinic will continue to follow.  ? ?Dennison Nancy CPHT ?Specialty Pharmacy Patient Advocate ?[place] ?Phone [phone] ?Fax [phone] ?05/15/2021 3:48 PM ? ?

## 2021-05-15 NOTE — Telephone Encounter (Signed)
Oral Oncology Patient Advocate Encounter ?  ?Received notification from [employer] that prior authorization for Tibsovo is required. ?  ?PA submitted on CoverMyMeds ?Key [id number] ?Status is pending ?  ?Oral Oncology Clinic will continue to follow. ? ?Dennison Nancy CPHT ?Specialty Pharmacy Patient Advocate ?[place] ?Phone [phone] ?Fax [phone] ?05/15/2021 8:53 AM ? ? ? ?

## 2021-05-15 NOTE — Progress Notes (Signed)
Faxed 05/14/21 office note,labs, recent CT and treatment plan change to Wilford Sports, LPN case manager with [employer] [phone].  ?

## 2021-05-16 ENCOUNTER — Inpatient Hospital Stay: Payer: No Typology Code available for payment source

## 2021-05-16 ENCOUNTER — Other Ambulatory Visit: Payer: Self-pay

## 2021-05-16 ENCOUNTER — Other Ambulatory Visit (HOSPITAL_COMMUNITY): Payer: Self-pay

## 2021-05-16 ENCOUNTER — Other Ambulatory Visit: Payer: Self-pay | Admitting: Family Medicine

## 2021-05-16 ENCOUNTER — Other Ambulatory Visit (HOSPITAL_BASED_OUTPATIENT_CLINIC_OR_DEPARTMENT_OTHER): Payer: Self-pay

## 2021-05-16 DIAGNOSIS — C221 Intrahepatic bile duct carcinoma: Secondary | ICD-10-CM

## 2021-05-16 MED ORDER — DEXCOM G6 SENSOR MISC
0 refills | Status: DC
  Filled 2021-05-16: qty 9, 90d supply, fill #0

## 2021-05-16 MED ORDER — IVOSIDENIB 250 MG PO TABS
500.0000 mg | ORAL_TABLET | Freq: Every day | ORAL | 1 refills | Status: DC
  Filled 2021-05-16: qty 60, 30d supply, fill #0
  Filled 2021-05-16: qty 60, fill #0
  Filled 2021-06-07: qty 60, 30d supply, fill #1

## 2021-05-16 NOTE — Telephone Encounter (Signed)
Oral Chemotherapy Pharmacist Encounter ? ?[facility] will deliver medication to the patient by Friday 05/18/21. He will get started on the Tibsovo Monday 05/21/21. ? ?Patient Education ?I spoke with patient's wife [name] for overview of new oral chemotherapy medication: Tibsovo (ivosidenib) for the treatment of metastatic cholangiocarcinoma, IDH1 R132C mutation positive ([facility], 02/08/20), planned duration until disease progression or unacceptable drug toxicity.  ? ?Counseled Karen on administration, dosing, side effects, monitoring, drug-food interactions, safe handling, storage, and disposal. ?Patient will take 2 tablets (500 mg) by mouth daily. ? ?Side effects include but not limited to: diarrhea (more likely) or constipation, edema, fatigue, changes in heart rhythm, nausea.   ?Changes in heart rhythm: [name] knows Mr. Heinle will have his ECGs checked while he is on treatment ?Nausea: [name] knows to NOT use the ondansetron they have at home due to the risk of QTc prolongation, the have prochlorperazine to use as needed ?Bowel movement changes: in the past patient has had trouble with constipation. They know what OTC products to use in the case of diarrhea or constipation. [name] know to call if he is having 4 or more loose stools ? ?Reviewed with [name] importance of keeping a medication schedule and plan for any missed doses. ? ?After discussion with [name] no patient barriers to medication adherence identified.  ? ?[name] voiced understanding and appreciation. All questions answered. Medication handout provided. ? ?Provided [name] with [facility] phone number. [name] knows to call the office with questions or concerns. [facility] will continue to follow. ? ?Darl Pikes, PharmD, BCPS, BCOP, CPP ?Hematology/Oncology Clinical Pharmacist Practitioner ?[facility] ?[phone] ? ?05/16/2021 3:16 PM ? ?

## 2021-05-17 ENCOUNTER — Other Ambulatory Visit (HOSPITAL_BASED_OUTPATIENT_CLINIC_OR_DEPARTMENT_OTHER): Payer: Self-pay

## 2021-05-18 ENCOUNTER — Other Ambulatory Visit (HOSPITAL_COMMUNITY): Payer: Self-pay

## 2021-05-21 ENCOUNTER — Other Ambulatory Visit (HOSPITAL_BASED_OUTPATIENT_CLINIC_OR_DEPARTMENT_OTHER): Payer: Self-pay

## 2021-05-22 ENCOUNTER — Other Ambulatory Visit: Payer: Self-pay

## 2021-05-22 ENCOUNTER — Inpatient Hospital Stay (HOSPITAL_BASED_OUTPATIENT_CLINIC_OR_DEPARTMENT_OTHER): Payer: No Typology Code available for payment source | Admitting: Oncology

## 2021-05-22 ENCOUNTER — Inpatient Hospital Stay: Payer: No Typology Code available for payment source

## 2021-05-22 VITALS — BP 143/96 | HR 79 | Temp 99.0°F | Resp 18 | Ht 73.0 in | Wt 241.8 lb

## 2021-05-22 VITALS — BP 142/82 | HR 73 | Resp 18

## 2021-05-22 DIAGNOSIS — C221 Intrahepatic bile duct carcinoma: Secondary | ICD-10-CM

## 2021-05-22 DIAGNOSIS — Z95828 Presence of other vascular implants and grafts: Secondary | ICD-10-CM

## 2021-05-22 LAB — CMP (CANCER CENTER ONLY)
ALT: 26 U/L (ref 0–44)
AST: 88 U/L — ABNORMAL HIGH (ref 15–41)
Albumin: 3.3 g/dL — ABNORMAL LOW (ref 3.5–5.0)
Alkaline Phosphatase: 136 U/L — ABNORMAL HIGH (ref 38–126)
Anion gap: 5 (ref 5–15)
BUN: 11 mg/dL (ref 6–20)
CO2: 26 mmol/L (ref 22–32)
Calcium: 11 mg/dL — ABNORMAL HIGH (ref 8.9–10.3)
Chloride: 101 mmol/L (ref 98–111)
Creatinine: 0.88 mg/dL (ref 0.61–1.24)
GFR, Estimated: >60 mL/min (ref >60)
Glucose, Bld: 168 mg/dL — ABNORMAL HIGH (ref 70–99)
Potassium: 4.3 mmol/L (ref 3.5–5.1)
Sodium: 132 mmol/L — ABNORMAL LOW (ref 135–145)
Total Bilirubin: 1.6 mg/dL — ABNORMAL HIGH (ref 0.3–1.2)
Total Protein: 7.3 g/dL (ref 6.5–8.1)

## 2021-05-22 MED ORDER — ZOLEDRONIC ACID 4 MG/100ML IV SOLN
4.0000 mg | Freq: Once | INTRAVENOUS | Status: AC
  Administered 2021-05-22: 4 mg via INTRAVENOUS
  Filled 2021-05-22: qty 100

## 2021-05-22 MED ORDER — SODIUM CHLORIDE 0.9% FLUSH
10.0000 mL | INTRAVENOUS | Status: DC | PRN
  Administered 2021-05-22: 10 mL via INTRAVENOUS

## 2021-05-22 MED ORDER — SODIUM CHLORIDE 0.9% FLUSH
10.0000 mL | Freq: Once | INTRAVENOUS | Status: AC
  Administered 2021-05-22: 10 mL via INTRAVENOUS

## 2021-05-22 MED ORDER — SODIUM CHLORIDE 0.9 % IV SOLN: Freq: Once | INTRAVENOUS | Status: DC

## 2021-05-22 MED ORDER — SODIUM CHLORIDE 0.9 % IV SOLN: INTRAVENOUS | Status: DC

## 2021-05-22 MED ORDER — HEPARIN SOD (PORK) LOCK FLUSH 100 UNIT/ML IV SOLN
500.0000 [IU] | Freq: Once | INTRAVENOUS | Status: AC
  Administered 2021-05-22: 500 [IU] via INTRAVENOUS

## 2021-05-22 NOTE — Patient Instructions (Signed)
Zoledronic Acid Injection (Cancer) ?What is this medication? ?ZOLEDRONIC ACID (ZOE le dron ik AS id) treats high calcium levels in the blood caused by cancer. It may also be used with chemotherapy to treat weakened bones caused by cancer. It works by slowing down the release of calcium from bones. This lowers calcium levels in your blood. It also makes your bones stronger and less likely to break (fracture). It belongs to a group of medications called bisphosphonates. ?This medicine may be used for other purposes; ask your health care provider or pharmacist if you have questions. ?COMMON BRAND NAME(S): Zometa, Zometa Powder ?What should I tell my care team before I take this medication? ?They need to know if you have any of these conditions: ?Dehydration ?Dental disease ?Kidney disease ?Liver disease ?Low levels of calcium in the blood ?Lung or breathing disease, such as asthma ?Receiving steroids, such as dexamethasone or prednisone ?An unusual or allergic reaction to zoledronic acid, other medications, foods, dyes, or preservatives ?Pregnant or trying to get pregnant ?Breast-feeding ?How should I use this medication? ?This medication is injected into a vein. It is given by your care team in a hospital or clinic setting. ?Talk to your care team about the use of this medication in children. Special care may be needed. ?Overdosage: If you think you have taken too much of this medicine contact a poison control center or emergency room at once. ?NOTE: This medicine is only for you. Do not share this medicine with others. ?What if I miss a dose? ?Keep appointments for follow-up doses. It is important not to miss your dose. Call your care team if you are unable to keep an appointment. ?What may interact with this medication? ?Certain antibiotics given by injection ?Diuretics, such as bumetanide, furosemide ?NSAIDs, medications for pain and inflammation, such as ibuprofen or naproxen ?Teriparatide ?Thalidomide ?This list  may not describe all possible interactions. Give your health care provider a list of all the medicines, herbs, non-prescription drugs, or dietary supplements you use. Also tell them if you smoke, drink alcohol, or use illegal drugs. Some items may interact with your medicine. ?What should I watch for while using this medication? ?Visit your care team for regular checks on your progress. It may be some time before you see the benefit from this medication. ?Some people who take this medication have severe bone, joint, or muscle pain. This medication may also increase your risk for jaw problems or a broken thigh bone. Tell your care team right away if you have severe pain in your jaw, bones, joints, or muscles. Tell you care team if you have any pain that does not go away or that gets worse. ?Tell your dentist and dental surgeon that you are taking this medication. You should not have major dental surgery while on this medication. See your dentist to have a dental exam and fix any dental problems before starting this medication. Take good care of your teeth while on this medication. Make sure you see your dentist for regular follow-up appointments. ?You should make sure you get enough calcium and vitamin D while you are taking this medication. Discuss the foods you eat and the vitamins you take with your care team. ?Check with your care team if you have severe diarrhea, nausea, and vomiting, or if you sweat a lot. The loss of too much body fluid may make it dangerous for you to take this medication. ?You may need bloodwork while taking this medication. ?Talk to your care team if  you wish to become pregnant or think you might be pregnant. This medication can cause serious birth defects. ?What side effects may I notice from receiving this medication? ?Side effects that you should report to your care team as soon as possible: ?Allergic reactions--skin rash, itching, hives, swelling of the face, lips, tongue, or  throat ?Kidney injury--decrease in the amount of urine, swelling of the ankles, hands, or feet ?Low calcium level--muscle pain or cramps, confusion, tingling, or numbness in the hands or feet ?Osteonecrosis of the jaw--pain, swelling, or redness in the mouth, numbness of the jaw, poor healing after dental work, unusual discharge from the mouth, visible bones in the mouth ?Severe bone, joint, or muscle pain ?Side effects that usually do not require medical attention (report to your care team if they continue or are bothersome): ?Constipation ?Fatigue ?Fever ?Loss of appetite ?Nausea ?Stomach pain ?This list may not describe all possible side effects. Call your doctor for medical advice about side effects. You may report side effects to FDA at 1-800-FDA-1088. ?Where should I keep my medication? ?This medication is given in a hospital or clinic. It will not be stored at home. ?NOTE: This sheet is a summary. It may not cover all possible information. If you have questions about this medicine, talk to your doctor, pharmacist, or health care provider. ?? 2023 Elsevier/Gold Standard (2021-03-16 00:00:00) ? ?Rehydration, Adult ?Rehydration is the replacement of body fluids, salts, and minerals (electrolytes) that are lost during dehydration. Dehydration is when there is not enough water or other fluids in the body. This happens when you lose more fluids than you take in. Common causes of dehydration include: ?Not drinking enough fluids. This can occur when you are ill or doing activities that require a lot of energy, especially in hot weather. ?Conditions that cause loss of water or other fluids, such as diarrhea, vomiting, sweating, or urinating a lot. ?Other illnesses, such as fever or infection. ?Certain medicines, such as those that remove excess fluid from the body (diuretics). ?Symptoms of mild or moderate dehydration may include thirst, dry lips and mouth, and dizziness. Symptoms of severe dehydration may include  increased heart rate, confusion, fainting, and not urinating. ?For severe dehydration, you may need to get fluids through an IV at the hospital. For mild or moderate dehydration, you can usually rehydrate at home by drinking certain fluids as told by your health care provider. ?What are the risks? ?Generally, rehydration is safe. However, taking in too much fluid (overhydration) can be a problem. This is rare. Overhydration can cause an electrolyte imbalance, kidney failure, or a decrease in salt (sodium) levels in the body. ?Supplies needed ?You will need an oral rehydration solution (ORS) if your health care provider tells you to use one. This is a drink to treat dehydration. It can be found in pharmacies and retail stores. ?How to rehydrate ?Fluids ?Follow instructions from your health care provider for rehydration. The kind of fluid and the amount you should drink depend on your condition. In general, you should choose drinks that you prefer. ?If told by your health care provider, drink an ORS. ?Make an ORS by following instructions on the package. ?Start by drinking small amounts, about ? cup (120 mL) every 5-10 minutes. ?Slowly increase how much you drink until you have taken the amount recommended by your health care provider. ?Drink enough clear fluids to keep your urine pale yellow. If you were told to drink an ORS, finish it first, then start slowly drinking other clear  fluids. Drink fluids such as: ?Water. This includes sparkling water and flavored water. Drinking only water can lead to having too little sodium in your body (hyponatremia). Follow the advice of your health care provider. ?Water from ice chips you suck on. ?Fruit juice with water you add to it (diluted). ?Sports drinks. ?Hot or cold herbal teas. ?Broth-based soups. ?Milk or milk products. ?Food ?Follow instructions from your health care provider about what to eat while you rehydrate. Your health care provider may recommend that you slowly  begin eating regular foods in small amounts. ?Eat foods that contain a healthy balance of electrolytes, such as bananas, oranges, potatoes, tomatoes, and spinach. ?Avoid foods that are greasy or contain a lot of su

## 2021-05-22 NOTE — Progress Notes (Signed)
?[place] ?OFFICE PROGRESS NOTE ? ? ?Diagnosis: Cholangiocarcinoma ? ?INTERVAL HISTORY:  ? ?Matthew Osborne returns prior to a scheduled visit.  He began ivosidenib yesterday.  He developed generalized weakness beginning a few days ago.  He has a poor appetite.  He fell yesterday.  His wife noted he had a temperature of 100 degrees yesterday.  He has a chronic intermittent cough.  No dyspnea.  He has noted fullness in the left ear.  He has malaise and somnolence. ? ? ?Objective: ? ?Vital signs in last 24 hours: ? ?Blood pressure (!) 143/96, pulse 79, temperature 99 ?F (37.2 ?C), temperature source Oral, resp. rate 18, height $RemoveBe'6\' 1"'gLSmBaxYt$  (1.854 m), weight 241 lb 12.8 oz (109.7 kg), SpO2 100 %. ?  ? ?HEENT: The mucous membranes are moist.  No thrush.  The right external canal and tympanic membrane appear clear.  Possible mild fluid behind the left tympanic membrane.  The left external canal is clear. ?Resp: End inspiratory coarse rhonchi at the right posterior base, no respiratory distress ?Cardio: Regular rate and rhythm ?GI: No hepatosplenomegaly, nontender ?Vascular: No leg edema ?Neurologic: Alert and oriented ?  ? ?Portacath/PICC-without erythema ? ?Lab Results: ? ?Lab Results  ?Component Value Date  ? WBC 4.1 05/14/2021  ? HGB 13.9 05/14/2021  ? HCT 41.3 05/14/2021  ? MCV 100.0 05/14/2021  ? PLT 128 (L) 05/14/2021  ? NEUTROABS 2.0 05/14/2021  ? ? ?CMP  ?Lab Results  ?Component Value Date  ? NA 132 (L) 05/22/2021  ? K 4.3 05/22/2021  ? CL 101 05/22/2021  ? CO2 26 05/22/2021  ? GLUCOSE 168 (H) 05/22/2021  ? BUN 11 05/22/2021  ? CREATININE 0.88 05/22/2021  ? CALCIUM 11.0 (H) 05/22/2021  ? PROT 7.3 05/22/2021  ? ALBUMIN 3.3 (L) 05/22/2021  ? AST 88 (H) 05/22/2021  ? ALT 26 05/22/2021  ? ALKPHOS 136 (H) 05/22/2021  ? BILITOT 1.6 (H) 05/22/2021  ? GFRNONAA >60 05/22/2021  ? GFRAA 127 01/11/2020  ? ? ?Lab Results  ?Component Value Date  ? CEA1 2.15 01/14/2020  ? [id number] 82 (H) 08/21/2020  ? ? ?Medications: I have  reviewed the patient's current medications. ? ? ?Assessment/Plan: ?Cholangiocarcinoma  multiple liver masses and abdominal lymphadenopathy ?CTs 01/13/2020-rounded hypodense mass appears to arise from the pancreas neck, multiple rim-enhancing masses in the liver, primarily left liver with segmental dilation of the left lobe Intermatic bile ducts, ill-defined hypodensity the central liver with effacement of the left portal vein, enlarged portacaval and retroperitoneal lymph nodes ?Ultrasound-guided biopsy of the left liver lesion 01/19/2020-adenocarcinoma, cytokeratin 7+, MSS, tumor mutation burden 1, IDH1 R132C ?MRI abdomen 01/31/2020-poorly marginated central liver mass, multiple smaller similar satellite liver masses, extrinsic mass-effect at the biliary hilum with intrahepatic biliary ductal dilatation throughout the left liver with mild Intermatic dilatation the superior right liver, no pancreas mass or ductal dilatation, normal spleen size, numerous enlarged enhancing lymph nodes at the porta hepatis, peripancreatic, portacaval, aortocaval, left periaortic chains ?Cycle 1 gemcitabine/cisplatin 02/04/2020 ?Cycle 2 gemcitabine/cisplatin 02/28/2020 ?Cycle 3 gemcitabine/cisplatin 03/20/2020 ?CT abdomen/pelvis 03/31/2020-mild decrease in central liver tumor, mild decrease in size of liver metastases and upper abdominal adenopathy, new splenomegaly ?Cycle 4 gemcitabine/cisplatin 04/10/2020 ?Cycle 5 gemcitabine/cisplatin 05/01/2020 ?Cycle 6 gemcitabine/cisplatin plus durvalumab 05/22/2020 ?CTs 06/06/2020- dominant central liver mass mildly enlarged, other liver lesions and abdominal adenopathy is stable, no evidence of metastatic disease to the chest ?Cycle 7 gemcitabine/cisplatin plus Durvalumab 06/12/2020 ?Cycle 8 gemcitabine/cisplatin plus Durvalumab 06/30/2020 ?Cycle 9 gemcitabine/cisplatin plus Durvalumab 07/31/2020 ?CT abdomen/pelvis  08/20/2020-decreased size of dominant central liver mass, slight increase in size and stable  additional liver lesions, stable portacaval node ?Cycle 10 gemcitabine/cisplatin plus Durvalumab 08/21/2020 ?Cycle 11 gemcitabine/cisplatin plus Durvalumab 09/11/2020 ?Cycle 12 gemcitabine/cisplatin plus Durvalumab 10/02/2020 ?Cycle 13 gemcitabine/cisplatin plus Durvalumab 10/23/2020 ?CT abdomen/pelvis 11/10/2020-no change in dominant central liver mass and additional liver lesions, stable upper retroperitoneal adenopathy ?Cycle 14 gemcitabine/cisplatin plus Durvalumab 11/13/2020 ?Cycle 15 gemcitabine/cisplatin plus Durvalumab 12/04/2020 ?Cycle 15 gemcitabine/cisplatin plus Durvalumab 01/08/2021 ?Cycle 16 gemcitabine/cisplatin plus Durvalumab 01/30/2021-cisplatin held from day 1 and day 8 secondary to neuropathy ?CTs 02/15/2021-slight increase in size of central liver mass, progressive metastatic lesions in the right liver, stable to slightly decreased periportal lymph nodes, progression of splenomegaly, chronically thrombosed left portal vein ?Cycle 1 FOLFOX 03/05/2021 ?Cycle 2 FOLFOX 03/19/2021, oxaliplatin dose reduced due to thrombocytopenia ?Cycle 3 FOLFOX 04/02/2021 ?Cycle 4 FOLFOX 04/16/2021 ?Cycle 5 FOLFOX 04/30/2021 ?CTs 05/10/2021-new and enlarging liver metastases, chronic left lobe biliary dilatation, chronic left portal vein thrombosis, stable porta hepatis adenopathy, splenomegaly, no evidence of metastatic disease to the chest ?Ivosidenib 05/21/2021 ?  ?Cough-likely related to diaphragmatic irritation from #1 ?Anorexia/weight loss ?Hypercalcemia-likely hypercalcemia malignancy, status post intravenous hydration and Zometa 01/14/2020 ?Diabetes ?Hyperlipidemia ?Hypertension ?History of peripheral neuropathy secondary to diabetes ?Severe back pain following Fulphila-oxycodone prescribed ?Thrombocytopenia secondary to chemotherapy-gemcitabine held with day 1 cycle 8 and then dose reduced ?COVID-19 infection 07/22/2020, 12/25/2020 ?Cisplatin induced peripheral neuropathy ? ? ? ? ? ?Disposition: ?Matthew Osborne has metastatic  cholangiocarcinoma.  He began ivosidenib yesterday.  He presents today with failure to thrive.  His symptoms may be related to hypercalcemia.  He will receive intravenous fluids and Zometa today.  I have a low clinical suspicion for pneumonia.  His wife will contact us with an update on his condition tomorrow.  She will call if he develops a higher fever or increased cough. ? ?He will continue ivosidenib. ? ?Betsy Coder, MD ? ?05/22/2021  ?3:56 PM ? ? ?

## 2021-05-23 ENCOUNTER — Ambulatory Visit (HOSPITAL_BASED_OUTPATIENT_CLINIC_OR_DEPARTMENT_OTHER)
Admission: RE | Admit: 2021-05-23 | Discharge: 2021-05-23 | Disposition: A | Discharge status: Home / Self Care | Payer: No Typology Code available for payment source | Source: Ambulatory Visit | Attending: Oncology | Admitting: Oncology

## 2021-05-23 ENCOUNTER — Other Ambulatory Visit: Payer: Self-pay

## 2021-05-23 ENCOUNTER — Other Ambulatory Visit (HOSPITAL_BASED_OUTPATIENT_CLINIC_OR_DEPARTMENT_OTHER): Payer: Self-pay

## 2021-05-23 ENCOUNTER — Telehealth: Payer: Self-pay

## 2021-05-23 DIAGNOSIS — C221 Intrahepatic bile duct carcinoma: Secondary | ICD-10-CM | POA: Insufficient documentation

## 2021-05-23 MED ORDER — DOXYCYCLINE HYCLATE 100 MG PO TABS
100.0000 mg | ORAL_TABLET | Freq: Two times a day (BID) | ORAL | 0 refills | Status: AC
  Filled 2021-05-23: qty 14, 7d supply, fill #0

## 2021-05-23 NOTE — Telephone Encounter (Signed)
TC from Pt's wife stating Pt was feeling a little better today. Pt was up out of bed. She also stated Pt has a low grade fever of 99.9 and is still coughing. Pt's wife stated Dr Benay Spice informed her he would order a chest x-ray if she thought he needed one and she would like for him to have the x-ray. Discussed with Dr Benay Spice x-ray order placed and informed Pt's wife that Dr Benay Spice also ordered an antibiotic (doxycycline) for Pt which was sent to [facility]. Pt's wife verbalized understanding. ?

## 2021-05-25 ENCOUNTER — Encounter: Payer: Self-pay | Admitting: *Deleted

## 2021-05-28 ENCOUNTER — Other Ambulatory Visit: Payer: Self-pay

## 2021-05-28 ENCOUNTER — Inpatient Hospital Stay: Payer: No Typology Code available for payment source

## 2021-05-28 ENCOUNTER — Encounter: Payer: Self-pay | Admitting: *Deleted

## 2021-05-28 ENCOUNTER — Inpatient Hospital Stay (HOSPITAL_BASED_OUTPATIENT_CLINIC_OR_DEPARTMENT_OTHER): Payer: No Typology Code available for payment source | Admitting: Oncology

## 2021-05-28 ENCOUNTER — Other Ambulatory Visit (HOSPITAL_BASED_OUTPATIENT_CLINIC_OR_DEPARTMENT_OTHER): Payer: Self-pay

## 2021-05-28 VITALS — BP 120/80 | HR 60 | Temp 98.1°F | Resp 18 | Ht 73.0 in | Wt 241.0 lb

## 2021-05-28 DIAGNOSIS — Z95828 Presence of other vascular implants and grafts: Secondary | ICD-10-CM

## 2021-05-28 DIAGNOSIS — C221 Intrahepatic bile duct carcinoma: Secondary | ICD-10-CM

## 2021-05-28 LAB — CMP (CANCER CENTER ONLY)
ALT: 20 U/L (ref 0–44)
AST: 60 U/L — ABNORMAL HIGH (ref 15–41)
Albumin: 3.3 g/dL — ABNORMAL LOW (ref 3.5–5.0)
Alkaline Phosphatase: 136 U/L — ABNORMAL HIGH (ref 38–126)
Anion gap: 5 (ref 5–15)
BUN: 12 mg/dL (ref 6–20)
CO2: 26 mmol/L (ref 22–32)
Calcium: 9.9 mg/dL (ref 8.9–10.3)
Chloride: 104 mmol/L (ref 98–111)
Creatinine: 0.88 mg/dL (ref 0.61–1.24)
GFR, Estimated: >60 mL/min (ref >60)
Glucose, Bld: 130 mg/dL — ABNORMAL HIGH (ref 70–99)
Potassium: 4.2 mmol/L (ref 3.5–5.1)
Sodium: 135 mmol/L (ref 135–145)
Total Bilirubin: 1 mg/dL (ref 0.3–1.2)
Total Protein: 7.6 g/dL (ref 6.5–8.1)

## 2021-05-28 LAB — CBC WITH DIFFERENTIAL (CANCER CENTER ONLY)
Abs Immature Granulocytes: 0.03 10*3/uL (ref 0.00–0.07)
Basophils Absolute: 0.1 10*3/uL (ref 0.0–0.1)
Basophils Relative: 1 %
Eosinophils Absolute: 0.4 10*3/uL (ref 0.0–0.5)
Eosinophils Relative: 5 %
HCT: 39.2 % (ref 39.0–52.0)
Hemoglobin: 12.7 g/dL — ABNORMAL LOW (ref 13.0–17.0)
Immature Granulocytes: 1 %
Lymphocytes Relative: 19 %
Lymphs Abs: 1.3 10*3/uL (ref 0.7–4.0)
MCH: 32.8 pg (ref 26.0–34.0)
MCHC: 32.4 g/dL (ref 30.0–36.0)
MCV: 101.3 fL — ABNORMAL HIGH (ref 80.0–100.0)
Monocytes Absolute: 0.7 10*3/uL (ref 0.1–1.0)
Monocytes Relative: 11 %
Neutro Abs: 4.1 10*3/uL (ref 1.7–7.7)
Neutrophils Relative %: 63 %
Platelet Count: 125 10*3/uL — ABNORMAL LOW (ref 150–400)
RBC: 3.87 MIL/uL — ABNORMAL LOW (ref 4.22–5.81)
RDW: 15.5 % (ref 11.5–15.5)
WBC Count: 6.6 10*3/uL (ref 4.0–10.5)
nRBC: 0 % (ref 0.0–0.2)

## 2021-05-28 LAB — CK TOTAL AND CKMB (NOT AT ARMC)
CK, MB: 2.5 ng/mL (ref 0.5–5.0)
Relative Index: INVALID (ref 0.0–2.5)
Total CK: 59 U/L (ref 49–397)

## 2021-05-28 LAB — MAGNESIUM: Magnesium: 2.1 mg/dL (ref 1.7–2.4)

## 2021-05-28 LAB — PHOSPHORUS: Phosphorus: 3.1 mg/dL (ref 2.5–4.6)

## 2021-05-28 MED ORDER — SODIUM CHLORIDE 0.9% FLUSH
10.0000 mL | INTRAVENOUS | Status: AC | PRN
  Administered 2021-05-28: 10 mL via INTRAVENOUS

## 2021-05-28 MED ORDER — HEPARIN SOD (PORK) LOCK FLUSH 100 UNIT/ML IV SOLN
500.0000 [IU] | Freq: Once | INTRAVENOUS | Status: AC
  Administered 2021-05-28: 500 [IU] via INTRAVENOUS

## 2021-05-28 NOTE — Progress Notes (Signed)
?Matthew Osborne ?OFFICE PROGRESS NOTE ? ? ?Diagnosis: Cholangiocarcinoma ? ?INTERVAL HISTORY:  ? ?Matthew Osborne returns as scheduled.  He had a low-grade fever, malaise, and somnolence last week.  He is completing a course of doxycycline.  A chest x-ray 05/23/2021 was negative for pneumonia.  He reports feeling much better.  Good appetite.  His energy level has improved. ?He continues ivosidenib.  No rash, nausea, or diarrhea. ? ?Objective: ? ?Vital signs in last 24 hours: ? ?Blood pressure 120/80, pulse 60, temperature 98.1 ?F (36.7 ?C), temperature source Oral, resp. rate 18, height $RemoveBe'6\' 1"'iFcbHBCDI$  (1.854 m), weight 241 lb (109.3 kg), SpO2 100 %. ?  ? ?HEENT: No thrush or ulcers ?Resp: Decreased breath sounds at the right lower posterior chest, no respiratory distress ?Cardio: Regular rate and rhythm ?GI: No hepatosplenomegaly, mild tenderness in the right upper abdomen ?Vascular: No leg edema ? ? ?Portacath/PICC-without erythema ? ?Lab Results: ? ?Lab Results  ?Component Value Date  ? WBC 6.6 05/28/2021  ? HGB 12.7 (L) 05/28/2021  ? HCT 39.2 05/28/2021  ? MCV 101.3 (H) 05/28/2021  ? PLT 125 (L) 05/28/2021  ? NEUTROABS 4.1 05/28/2021  ? ? ?CMP  ?Lab Results  ?Component Value Date  ? NA 135 05/28/2021  ? K 4.2 05/28/2021  ? CL 104 05/28/2021  ? CO2 26 05/28/2021  ? GLUCOSE 130 (H) 05/28/2021  ? BUN 12 05/28/2021  ? CREATININE 0.88 05/28/2021  ? CALCIUM 9.9 05/28/2021  ? PROT 7.6 05/28/2021  ? ALBUMIN 3.3 (L) 05/28/2021  ? AST 60 (H) 05/28/2021  ? ALT 20 05/28/2021  ? ALKPHOS 136 (H) 05/28/2021  ? BILITOT 1.0 05/28/2021  ? GFRNONAA >60 05/28/2021  ? GFRAA 127 01/11/2020  ? ? ?Lab Results  ?Component Value Date  ? CEA1 2.15 01/14/2020  ? [id number] 82 (H) 08/21/2020  ? ? ? ?Medications: I have reviewed the patient's current medications. ? ? ?Assessment/Plan: ? ?Cholangiocarcinoma  multiple liver masses and abdominal lymphadenopathy ?CTs 01/13/2020-rounded hypodense mass appears to arise from the pancreas neck, multiple  rim-enhancing masses in the liver, primarily left liver with segmental dilation of the left lobe Intermatic bile ducts, ill-defined hypodensity the central liver with effacement of the left portal vein, enlarged portacaval and retroperitoneal lymph nodes ?Ultrasound-guided biopsy of the left liver lesion 01/19/2020-adenocarcinoma, cytokeratin 7+, MSS, tumor mutation burden 1, IDH1 R132C ?MRI abdomen 01/31/2020-poorly marginated central liver mass, multiple smaller similar satellite liver masses, extrinsic mass-effect at the biliary hilum with intrahepatic biliary ductal dilatation throughout the left liver with mild Intermatic dilatation the superior right liver, no pancreas mass or ductal dilatation, normal spleen size, numerous enlarged enhancing lymph nodes at the porta hepatis, peripancreatic, portacaval, aortocaval, left periaortic chains ?Cycle 1 gemcitabine/cisplatin 02/04/2020 ?Cycle 2 gemcitabine/cisplatin 02/28/2020 ?Cycle 3 gemcitabine/cisplatin 03/20/2020 ?CT abdomen/pelvis 03/31/2020-mild decrease in central liver tumor, mild decrease in size of liver metastases and upper abdominal adenopathy, new splenomegaly ?Cycle 4 gemcitabine/cisplatin 04/10/2020 ?Cycle 5 gemcitabine/cisplatin 05/01/2020 ?Cycle 6 gemcitabine/cisplatin plus durvalumab 05/22/2020 ?CTs 06/06/2020- dominant central liver mass mildly enlarged, other liver lesions and abdominal adenopathy is stable, no evidence of metastatic disease to the chest ?Cycle 7 gemcitabine/cisplatin plus Durvalumab 06/12/2020 ?Cycle 8 gemcitabine/cisplatin plus Durvalumab 06/30/2020 ?Cycle 9 gemcitabine/cisplatin plus Durvalumab 07/31/2020 ?CT abdomen/pelvis 08/20/2020-decreased size of dominant central liver mass, slight increase in size and stable additional liver lesions, stable portacaval node ?Cycle 10 gemcitabine/cisplatin plus Durvalumab 08/21/2020 ?Cycle 11 gemcitabine/cisplatin plus Durvalumab 09/11/2020 ?Cycle 12 gemcitabine/cisplatin plus Durvalumab 10/02/2020 ?Cycle  13 gemcitabine/cisplatin plus Durvalumab 10/23/2020 ?CT  abdomen/pelvis 11/10/2020-no change in dominant central liver mass and additional liver lesions, stable upper retroperitoneal adenopathy ?Cycle 14 gemcitabine/cisplatin plus Durvalumab 11/13/2020 ?Cycle 15 gemcitabine/cisplatin plus Durvalumab 12/04/2020 ?Cycle 15 gemcitabine/cisplatin plus Durvalumab 01/08/2021 ?Cycle 16 gemcitabine/cisplatin plus Durvalumab 01/30/2021-cisplatin held from day 1 and day 8 secondary to neuropathy ?CTs 02/15/2021-slight increase in size of central liver mass, progressive metastatic lesions in the right liver, stable to slightly decreased periportal lymph nodes, progression of splenomegaly, chronically thrombosed left portal vein ?Cycle 1 FOLFOX 03/05/2021 ?Cycle 2 FOLFOX 03/19/2021, oxaliplatin dose reduced due to thrombocytopenia ?Cycle 3 FOLFOX 04/02/2021 ?Cycle 4 FOLFOX 04/16/2021 ?Cycle 5 FOLFOX 04/30/2021 ?CTs 05/10/2021-new and enlarging liver metastases, chronic left lobe biliary dilatation, chronic left portal vein thrombosis, stable porta hepatis adenopathy, splenomegaly, no evidence of metastatic disease to the chest ?Ivosidenib 05/21/2021 ?  ?Cough-likely related to diaphragmatic irritation from #1 ?Anorexia/weight loss ?Hypercalcemia-likely hypercalcemia malignancy, status post intravenous hydration and Zometa 01/14/2020 ?Diabetes ?Hyperlipidemia ?Hypertension ?History of peripheral neuropathy secondary to diabetes ?Severe back pain following Fulphila-oxycodone prescribed ?Thrombocytopenia secondary to chemotherapy-gemcitabine held with day 1 cycle 8 and then dose reduced ?COVID-19 infection 07/22/2020, 12/25/2020 ?Cisplatin induced peripheral neuropathy ? ? ? ? ? ?Disposition: ?Matthew Osborne has an improved performance status compared to when he was here last week.  He is complaining a course of doxycycline for a febrile illness and he received Zometa 05/22/2021.  The calcium is normal today.  He will continue ivosidenib.  He will  have an EKG today. ? ?Matthew Osborne will return for an office visit and EKG in 1 week. ? ?Betsy Coder, MD ? ?05/28/2021  ?10:21 AM ? ? ?

## 2021-05-28 NOTE — Addendum Note (Signed)
Addended by: Lenox Ponds E on: 05/28/2021 11:01 AM ? ? Modules accepted: Orders ? ?

## 2021-05-28 NOTE — Progress Notes (Signed)
Patient seen by Dr. Benay Spice today ? ?Vitals are within treatment parameters. ? ?Labs reviewed by Dr. Benay Spice patient will not be receiving treatment today. ?  ?

## 2021-05-28 NOTE — Patient Instructions (Signed)

## 2021-05-30 ENCOUNTER — Inpatient Hospital Stay: Payer: No Typology Code available for payment source

## 2021-05-31 ENCOUNTER — Other Ambulatory Visit (HOSPITAL_BASED_OUTPATIENT_CLINIC_OR_DEPARTMENT_OTHER): Payer: Self-pay

## 2021-05-31 MED ORDER — OMNIPOD 5 DEXG7G6 PODS GEN 5 MISC
0 refills | Status: DC
  Filled 2021-05-31: qty 45, 90d supply, fill #0
  Filled 2021-06-11: qty 15, 30d supply, fill #0
  Filled 2021-07-06: qty 15, 30d supply, fill #1
  Filled 2021-08-06: qty 15, 30d supply, fill #2

## 2021-05-31 NOTE — Telephone Encounter (Signed)
PA approval for  Dexcom G6 Sensor [id number] ? ?Approve for 3 sensors per 30days   ?Effective for 12 months max from 05/01/21-04/11/2022 ?

## 2021-06-01 ENCOUNTER — Ambulatory Visit: Payer: No Typology Code available for payment source | Admitting: *Deleted

## 2021-06-01 DIAGNOSIS — E1165 Type 2 diabetes mellitus with hyperglycemia: Secondary | ICD-10-CM

## 2021-06-01 DIAGNOSIS — C221 Intrahepatic bile duct carcinoma: Secondary | ICD-10-CM

## 2021-06-01 NOTE — Patient Instructions (Signed)
Visit Information ? ?Thank you for taking time to visit with me today. Please don't hesitate to contact me if I can be of assistance to you before our next scheduled telephone appointment. ? ?Following are the goals we discussed today:  ?Attend all scheduled provider appointments ?Call provider office for new concerns or questions  ?check blood sugar at prescribed times: per Dexcom ?check feet daily for cuts, sores or redness ?trim toenails straight across ?keep feet up while sitting ?wash and dry feet carefully every day ?wear comfortable, cotton socks ?wear comfortable, well-fitting shoes ?Report to oncologist for any unwanted/ unmanageable side effects such as nausea, pain ?Try to stay well rested and keep stress to a minimum ?Avoid sick people ?Wash your hands well, wear a mask as needed to prevent infection ?Call RN care manager for any questions at [phone] ? ?Our next appointment is by telephone on 08/24/21 at 1045 am ? ?Please call the care guide team at [phone] if you need to cancel or reschedule your appointment.  ? ?If you are experiencing a Mental Health or [place] or need someone to talk to, please call the Suicide and Crisis Lifeline: 988 ?call the [place] National Suicide Prevention Lifeline: 418-689-8934 or TTY: (954)259-8698 TTY 912-881-8997) to talk to a trained counselor ?call 1-800-273-TALK (toll free, 24 hour hotline) ?go to [facility] [address], [place] [phone]) ?call 911  ? ?Patient verbalizes understanding of instructions and care plan provided today and agrees to view in [place]. Active MyChart status confirmed with patient.   ? ?Telephone follow up appointment with care management team member scheduled for:  08/24/21 ? ?Jacqlyn Larsen RNC, BSN ?RN Case Manager ?[place] ?[phone] ? ?

## 2021-06-01 NOTE — Chronic Care Management (AMB) (Signed)
? Care Management ?  ? RN Visit Note ? ?06/01/2021 ?Name: Matthew Osborne MRN: [id number] DOB: 1975/04/30 ? ?Subjective: ?Matthew Osborne is a 46 y.o. year old male who is a primary care patient of Matthew Osborne. The care management team was consulted for assistance with disease management and care coordination needs.   ? ?Engaged with patient by telephone for follow up visit in response to provider referral for case management and/or care coordination services.  ? ?Consent to Services:  ? Matthew Osborne was given information about Care Management services today including:  ?Care Management services includes personalized support from designated clinical staff supervised by his physician, including individualized plan of care and coordination with other care providers ?24/7 contact phone numbers for assistance for urgent and routine care needs. ?The patient may stop case management services at any time by phone call to the office staff. ? ?Patient agreed to services and consent obtained.  ? ?Assessment: Review of patient past medical history, allergies, medications, health status, including review of consultants reports, laboratory and other test data, was performed as part of comprehensive evaluation and provision of chronic care management services.  ? ?SDOH (Social Determinants of Health) assessments and interventions performed:   ? ?Care Plan ? ?Allergies  ?Allergen Reactions  ? Niaspan [Niacin] Other (See Comments)  ?  Flushing ?  ? ? ?Outpatient Encounter Medications as of 06/01/2021  ?Medication Sig Note  ? ALPRAZolam (XANAX) 0.5 MG tablet Take 1 tablet (0.5 mg total) by mouth 3 (three) times daily as needed.   ? bisoprolol-hydrochlorothiazide (ZIAC) 10-6.25 MG tablet TAKE 1 TABLET BY MOUTH EVERY DAY IN THE MORNING   ? blood glucose meter kit and supplies KIT Fasting prior to each meal three times a day   ? Continuous Blood Gluc Sensor (DEXCOM G6 SENSOR) MISC Use as directed to check blood sugar 5-6 times daily.    ? Continuous Blood Gluc Transmit (DEXCOM G6 TRANSMITTER) MISC USE AS DIRECTED TO CHECK BLOOD SUGAR 5-6 TIMES DAILY *needs office visit*   ? Glucagon (BAQSIMI TWO PACK) 3 MG/DOSE POWD as directed Nasally Once a day PRN 30 days   ? glucose blood test strip Dispense based on patient and insurance preference.  Use twice daily as directed. (FOR ICD-10 E11.65)   ? ibuprofen (ADVIL) 200 MG tablet Take 800 mg by mouth every 4 (four) hours as needed for fever.   ? Insulin Disposable Pump (OMNIPOD 5 G6 POD, GEN 5,) MISC Use as directed every 48 hours   ? Insulin Disposable Pump (OMNIPOD 5 G6 POD, GEN 5,) MISC Use as directed every 48 hours.   ? insulin glargine-yfgn (SEMGLEE, YFGN,) 100 UNIT/ML Pen Inject 70 Units into the skin 2 (two) times daily. (Patient taking differently: Inject 60-90 Units into the skin See admin instructions. Inject 90 units subcutaneously twice daily on chemo day and for 3 days therafter; inject 60 units twice daily on non-chemo days) 12/25/2020: 60 units this morning  ? insulin lispro (HUMALOG KWIKPEN) 100 UNIT/ML KwikPen CHECK BG PRIOR TO MEALS, IF BG IS 150-200 inject 2U, BG 201-250 inject 4U, 251-300 inject 6U, 301-350 inject 8U, 351-400 inject 10U. OVER 401 inject 12U & CALL Osborne   ? insulin lispro (HUMALOG) 100 UNIT/ML injection Inject 200 units/day under the skin via pump   ? Insulin Pen Needle (UNIFINE PENTIPS) 31G X 5 MM MISC 1 each by Other route 5 (five) times daily.   ? Insulin Syringe 27G X 1/2" 0.5 ML MISC Use  as directed to inject insulin SQ Q1D.   ? Ivosidenib 250 MG TABS Take 2 tablets (500 mg) by mouth daily.   ? Lancets Thin MISC Check BS BID   ? lidocaine-prilocaine (EMLA) cream APPLY 1 APPLICATION TOPICALLY AS DIRECTED. APPLY TO PORT SITE 1-2 HOURS PRIOR TO STICK AND COVER WITH PLASTIC WRAP.   ? LORazepam (ATIVAN) 0.5 MG tablet Take 1 tablet (0.5 mg total) by mouth at bedtime as needed for anxiety. (Patient taking differently: Take 0.5 mg by mouth at bedtime as needed  (anxiety/agitation after chemo treatments).)   ? prochlorperazine (COMPAZINE) 10 MG tablet TAKE 1 TABLET BY MOUTH EVERY 6 HOURS AS NEEDED FOR NAUSEA   ? traMADol (ULTRAM) 50 MG tablet TAKE 1 TABLET BY MOUTH EVERY 6 HOURS AS NEEDED FOR MODERATE PAIN (COUGH).   ? TRUEPLUS LANCETS 33G MISC 1 each by Does not apply route 2 (two) times daily.   ? ?Facility-Administered Encounter Medications as of 06/01/2021  ?Medication  ? sodium chloride flush (NS) 0.9 % injection 10 mL  ? sodium chloride flush (NS) 0.9 % injection 10 mL  ? ? ?Patient Active Problem List  ? Diagnosis Date Noted  ? COVID 12/25/2020  ? COVID-19 virus infection 12/25/2020  ? Genetic testing 03/24/2020  ? Family history of breast cancer   ? Family history of Hodgkin's lymphoma   ? Cholangiocarcinoma ([place]) 02/01/2020  ? Goals of care, counseling/discussion 01/21/2020  ? Pancreas cancer ([place]) 01/21/2020  ? Hypercalcemia 01/14/2020  ? Left elbow pain 05/19/2019  ? Numbness 05/19/2019  ? Nonproliferative retinopathy due to secondary diabetes ([place])   ? Allergic rhinitis 06/17/2014  ? Sinus congestion 05/27/2014  ? Chronic cough 02/14/2014  ? Essential hypertension, benign 06/30/2013  ? Hyperlipidemia 06/30/2013  ? Diabetes ([place]) 06/30/2013  ? ? ?Conditions to be addressed/monitored: DMII and Cancer ? ?Care Plan : RN Care Manager Plan of Care  ?Updates made by Matthew Mends, RN since 06/01/2021 12:00 AM  ?  ? ?Problem: No plan of care established for management of chronic disease states  (Diabetes, Cancer)   ?Priority: High  ?  ? ?Long-Range Goal: Development of plan of care for chronic disease management  (Diabetes, Cancer)   ?Start Date: 01/05/2021  ?Expected End Date: 11/28/2021  ?Priority: High  ?Note:   ?Current Barriers:  ?Knowledge Deficits related to plan of care for management of DMII and Cancer  - Spoke with patient and permission also given to speak with wife Matthew Osborne if needed.  Patient lives with spouse and children, reports is overall independent with  ADL, IADL's, continues to drive,  undergoing chemotherapy po managed by Matthew Osborne for cholangiocarcinoma/ pancreatic cancer.  Patient has dexcom for blood sugar monitoring with fasting readings 120-130's range, random ranges 140-150's range. Pt denies any nausea and states "appetite is good"  ? ?RNCM Clinical Goal(s):  ?Patient will verbalize understanding of plan for management of DMII and Cancer as evidenced by patient/ spouse report, review EHR and  through collaboration with RN Care manager, provider, and care team.  ? ?Interventions: ?1:1 collaboration with primary care provider regarding development and update of comprehensive plan of care as evidenced by provider attestation and co-signature ?Inter-disciplinary care team collaboration (see longitudinal plan of care) ?Evaluation of current treatment plan related to  self management and patient's adherence to plan as established by provider ? ?Diabetes Interventions:  (Status:  New goal.) Long Term Goal ?Assessed patient's understanding of A1c goal: <7% ?Reviewed medications with patient and discussed importance of medication  adherence ?Review of patient status, including review of consultants reports, relevant laboratory and other test results, and medications completed ?Reviewed carbohydrate modified diet ?Reviewed CBG log with patient ?Lab Results  ?Component Value Date  ? HGBA1C 7.6 (H) 12/25/2020  ?Oncology:  (Status: New goal.) Long Term Goal ?Assessment of understanding of oncology diagnosis:  ?Assessed patient understanding of cancer diagnosis and recommended treatment plan, Reviewed upcoming provider appointments and treatment appointments, Assessed available transportation to appointments and treatments. Has consistent/reliable transportation: Yes, and Assessed support system. Has consistent/reliable family or other support: Yes ?Reviewed importance of good handwashing, wearing a mask as needed and avoiding sick people ?Reinforced reporting to  oncologist any unwanted side effects such as nausea, vomiting, pain, etc ?Reviewed importance of getting adequate rest ?Pain assessment completed ? ?Patient Goals/Self-Care Activities: ?Attend all scheduled provider ap

## 2021-06-04 ENCOUNTER — Inpatient Hospital Stay (HOSPITAL_BASED_OUTPATIENT_CLINIC_OR_DEPARTMENT_OTHER): Payer: No Typology Code available for payment source | Admitting: Nurse Practitioner

## 2021-06-04 ENCOUNTER — Other Ambulatory Visit: Payer: Self-pay

## 2021-06-04 ENCOUNTER — Inpatient Hospital Stay: Payer: No Typology Code available for payment source

## 2021-06-04 ENCOUNTER — Encounter: Payer: Self-pay | Admitting: Nurse Practitioner

## 2021-06-04 ENCOUNTER — Encounter: Payer: Self-pay | Admitting: *Deleted

## 2021-06-04 ENCOUNTER — Inpatient Hospital Stay: Payer: No Typology Code available for payment source | Attending: Oncology

## 2021-06-04 VITALS — BP 145/86 | HR 67 | Temp 98.1°F | Resp 18

## 2021-06-04 VITALS — BP 128/90 | HR 66 | Temp 98.1°F | Resp 18 | Ht 73.0 in | Wt 241.6 lb

## 2021-06-04 DIAGNOSIS — I10 Essential (primary) hypertension: Secondary | ICD-10-CM | POA: Diagnosis not present

## 2021-06-04 DIAGNOSIS — D6959 Other secondary thrombocytopenia: Secondary | ICD-10-CM | POA: Diagnosis not present

## 2021-06-04 DIAGNOSIS — C221 Intrahepatic bile duct carcinoma: Secondary | ICD-10-CM

## 2021-06-04 DIAGNOSIS — E785 Hyperlipidemia, unspecified: Secondary | ICD-10-CM | POA: Insufficient documentation

## 2021-06-04 DIAGNOSIS — M549 Dorsalgia, unspecified: Secondary | ICD-10-CM | POA: Diagnosis not present

## 2021-06-04 DIAGNOSIS — I231 Atrial septal defect as current complication following acute myocardial infarction: Secondary | ICD-10-CM | POA: Insufficient documentation

## 2021-06-04 DIAGNOSIS — R630 Anorexia: Secondary | ICD-10-CM | POA: Insufficient documentation

## 2021-06-04 DIAGNOSIS — Z95828 Presence of other vascular implants and grafts: Secondary | ICD-10-CM

## 2021-06-04 DIAGNOSIS — E1142 Type 2 diabetes mellitus with diabetic polyneuropathy: Secondary | ICD-10-CM | POA: Diagnosis not present

## 2021-06-04 DIAGNOSIS — G620 Drug-induced polyneuropathy: Secondary | ICD-10-CM | POA: Insufficient documentation

## 2021-06-04 LAB — CBC WITH DIFFERENTIAL (CANCER CENTER ONLY)
Abs Immature Granulocytes: 0.01 10*3/uL (ref 0.00–0.07)
Basophils Absolute: 0.1 10*3/uL (ref 0.0–0.1)
Basophils Relative: 1 %
Eosinophils Absolute: 0.4 10*3/uL (ref 0.0–0.5)
Eosinophils Relative: 6 %
HCT: 37.3 % — ABNORMAL LOW (ref 39.0–52.0)
Hemoglobin: 12.4 g/dL — ABNORMAL LOW (ref 13.0–17.0)
Immature Granulocytes: 0 %
Lymphocytes Relative: 17 %
Lymphs Abs: 1.1 10*3/uL (ref 0.7–4.0)
MCH: 33.1 pg (ref 26.0–34.0)
MCHC: 33.2 g/dL (ref 30.0–36.0)
MCV: 99.5 fL (ref 80.0–100.0)
Monocytes Absolute: 0.7 10*3/uL (ref 0.1–1.0)
Monocytes Relative: 10 %
Neutro Abs: 4.3 10*3/uL (ref 1.7–7.7)
Neutrophils Relative %: 66 %
Platelet Count: 132 10*3/uL — ABNORMAL LOW (ref 150–400)
RBC: 3.75 MIL/uL — ABNORMAL LOW (ref 4.22–5.81)
RDW: 14.8 % (ref 11.5–15.5)
WBC Count: 6.5 10*3/uL (ref 4.0–10.5)
nRBC: 0 % (ref 0.0–0.2)

## 2021-06-04 LAB — PHOSPHORUS: Phosphorus: 3.1 mg/dL (ref 2.5–4.6)

## 2021-06-04 LAB — CMP (CANCER CENTER ONLY)
ALT: 19 U/L (ref 0–44)
AST: 72 U/L — ABNORMAL HIGH (ref 15–41)
Albumin: 3.3 g/dL — ABNORMAL LOW (ref 3.5–5.0)
Alkaline Phosphatase: 141 U/L — ABNORMAL HIGH (ref 38–126)
Anion gap: 7 (ref 5–15)
BUN: 12 mg/dL (ref 6–20)
CO2: 26 mmol/L (ref 22–32)
Calcium: 9.8 mg/dL (ref 8.9–10.3)
Chloride: 102 mmol/L (ref 98–111)
Creatinine: 0.78 mg/dL (ref 0.61–1.24)
GFR, Estimated: >60 mL/min (ref >60)
Glucose, Bld: 103 mg/dL — ABNORMAL HIGH (ref 70–99)
Potassium: 3.9 mmol/L (ref 3.5–5.1)
Sodium: 135 mmol/L (ref 135–145)
Total Bilirubin: 1.3 mg/dL — ABNORMAL HIGH (ref 0.3–1.2)
Total Protein: 7.4 g/dL (ref 6.5–8.1)

## 2021-06-04 LAB — CK TOTAL AND CKMB (NOT AT ARMC)
CK, MB: 2.7 ng/mL (ref 0.5–5.0)
Relative Index: 2.1 (ref 0.0–2.5)
Total CK: 126 U/L (ref 49–397)

## 2021-06-04 LAB — MAGNESIUM: Magnesium: 2.1 mg/dL (ref 1.7–2.4)

## 2021-06-04 MED ORDER — HEPARIN SOD (PORK) LOCK FLUSH 100 UNIT/ML IV SOLN
500.0000 [IU] | Freq: Once | INTRAVENOUS | Status: AC
  Administered 2021-06-04: 500 [IU] via INTRAVENOUS

## 2021-06-04 MED ORDER — SODIUM CHLORIDE 0.9% FLUSH
10.0000 mL | Freq: Once | INTRAVENOUS | Status: AC
  Administered 2021-06-04: 10 mL via INTRAVENOUS

## 2021-06-04 MED ORDER — SODIUM CHLORIDE 0.9 % IV SOLN: INTRAVENOUS | Status: AC

## 2021-06-04 NOTE — Patient Instructions (Signed)
Rehydration, Adult Rehydration is the replacement of body fluids, salts, and minerals (electrolytes) that are lost during dehydration. Dehydration is when there is not enough water or other fluids in the body. This happens when you lose more fluids than you take in. Common causes of dehydration include: Not drinking enough fluids. This can occur when you are ill or doing activities that require a lot of energy, especially in hot weather. Conditions that cause loss of water or other fluids, such as diarrhea, vomiting, sweating, or urinating a lot. Other illnesses, such as fever or infection. Certain medicines, such as those that remove excess fluid from the body (diuretics). Symptoms of mild or moderate dehydration may include thirst, dry lips and mouth, and dizziness. Symptoms of severe dehydration may include increased heart rate, confusion, fainting, and not urinating. For severe dehydration, you may need to get fluids through an IV at the hospital. For mild or moderate dehydration, you can usually rehydrate at home by drinking certain fluids as told by your health care provider. What are the risks? Generally, rehydration is safe. However, taking in too much fluid (overhydration) can be a problem. This is rare. Overhydration can cause an electrolyte imbalance, kidney failure, or a decrease in salt (sodium) levels in the body. Supplies needed You will need an oral rehydration solution (ORS) if your health care provider tells you to use one. This is a drink to treat dehydration. It can be found in pharmacies and retail stores. How to rehydrate Fluids Follow instructions from your health care provider for rehydration. The kind of fluid and the amount you should drink depend on your condition. In general, you should choose drinks that you prefer. If told by your health care provider, drink an ORS. Make an ORS by following instructions on the package. Start by drinking small amounts, about  cup (120  mL) every 5-10 minutes. Slowly increase how much you drink until you have taken the amount recommended by your health care provider. Drink enough clear fluids to keep your urine pale yellow. If you were told to drink an ORS, finish it first, then start slowly drinking other clear fluids. Drink fluids such as: Water. This includes sparkling water and flavored water. Drinking only water can lead to having too little sodium in your body (hyponatremia). Follow the advice of your health care provider. Water from ice chips you suck on. Fruit juice with water you add to it (diluted). Sports drinks. Hot or cold herbal teas. Broth-based soups. Milk or milk products. Food Follow instructions from your health care provider about what to eat while you rehydrate. Your health care provider may recommend that you slowly begin eating regular foods in small amounts. Eat foods that contain a healthy balance of electrolytes, such as bananas, oranges, potatoes, tomatoes, and spinach. Avoid foods that are greasy or contain a lot of sugar. In some cases, you may get nutrition through a feeding tube that is passed through your nose and into your stomach (nasogastric tube, or NG tube). This may be done if you have uncontrolled vomiting or diarrhea. Beverages to avoid  Certain beverages may make dehydration worse. While you rehydrate, avoid drinking alcohol. How to tell if you are recovering from dehydration You may be recovering from dehydration if: You are urinating more often than before you started rehydrating. Your urine is pale yellow. Your energy level improves. You vomit less frequently. You have diarrhea less frequently. Your appetite improves or returns to normal. You feel less dizzy or less light-headed.   Your skin tone and color start to look more normal. Follow these instructions at home: Take over-the-counter and prescription medicines only as told by your health care provider. Do not take sodium  tablets. Doing this can lead to having too much sodium in your body (hypernatremia). Contact a health care provider if: You continue to have symptoms of mild or moderate dehydration, such as: Thirst. Dry lips. Slightly dry mouth. Dizziness. Dark urine or less urine than normal. Muscle cramps. You continue to vomit or have diarrhea. Get help right away if you: Have symptoms of dehydration that get worse. Have a fever. Have a severe headache. Have been vomiting and the following happens: Your vomiting gets worse or does not go away. Your vomit includes blood or green matter (bile). You cannot eat or drink without vomiting. Have problems with urination or bowel movements, such as: Diarrhea that gets worse or does not go away. Blood in your stool (feces). This may cause stool to look black and tarry. Not urinating, or urinating only a small amount of very dark urine, within 6-8 hours. Have trouble breathing. Have symptoms that get worse with treatment. These symptoms may represent a serious problem that is an emergency. Do not wait to see if the symptoms will go away. Get medical help right away. Call your local emergency services (911 in the [place] Do not drive yourself to the hospital. Summary Rehydration is the replacement of body fluids and minerals (electrolytes) that are lost during dehydration. Follow instructions from your health care provider for rehydration. The kind of fluid and amount you should drink depend on your condition. Slowly increase how much you drink until you have taken the amount recommended by your health care provider. Contact your health care provider if you continue to show signs of mild or moderate dehydration. This information is not intended to replace advice given to you by your health care provider. Make sure you discuss any questions you have with your health care provider. Document Revised: 03/24/2019 Document Reviewed: 02/01/2019 Elsevier Patient  Education  2023 [employer] Inc.  

## 2021-06-04 NOTE — Progress Notes (Signed)
?Matthew Osborne ?OFFICE PROGRESS NOTE ? ? ?Diagnosis: Cholangiocarcinoma ? ?INTERVAL HISTORY:  ? ?[name] returns as scheduled.  He continues ivosidenib.  He denies nausea/vomiting.  No mouth sores.  No rash.  No diarrhea.  He denies shortness of breath.  Cough has returned to baseline.  He was fatigued yesterday.  He wonders if he did too much a few days prior.  He notes an intermittent "burning" discomfort at the left upper abdomen.  This has been occurring for at least 1 month.  The sensation is not on his skin.  He feels it is deeper. ? ?Objective: ? ?Vital signs in last 24 hours: ? ?Blood pressure 128/90, pulse 66, temperature 98.1 ?F (36.7 ?C), temperature source Oral, resp. rate 18, height $RemoveBe'6\' 1"'RHTOVVSvd$  (1.854 m), weight 241 lb 9.6 oz (109.6 kg), SpO2 98 %. ?  ? ?HEENT: No thrush or ulcers. ?Resp: Lungs clear bilaterally. ?Cardio: Regular rate and rhythm. ?GI: Abdomen soft.  Mild tenderness at the right upper abdomen.  No hepatosplenomegaly. ?Vascular: No leg edema. ?Skin: No rash. ?Port-A-Cath without erythema. ? ? ?Lab Results: ? ?Lab Results  ?Component Value Date  ? WBC 6.6 05/28/2021  ? HGB 12.7 (L) 05/28/2021  ? HCT 39.2 05/28/2021  ? MCV 101.3 (H) 05/28/2021  ? PLT 125 (L) 05/28/2021  ? NEUTROABS 4.1 05/28/2021  ? ? ?Imaging: ? ?No results found. ? ?Medications: I have reviewed the patient's current medications. ? ?Assessment/Plan: ?Cholangiocarcinoma  multiple liver masses and abdominal lymphadenopathy ?CTs 01/13/2020-rounded hypodense mass appears to arise from the pancreas neck, multiple rim-enhancing masses in the liver, primarily left liver with segmental dilation of the left lobe Intermatic bile ducts, ill-defined hypodensity the central liver with effacement of the left portal vein, enlarged portacaval and retroperitoneal lymph nodes ?Ultrasound-guided biopsy of the left liver lesion 01/19/2020-adenocarcinoma, cytokeratin 7+, MSS, tumor mutation burden 1, IDH1 R132C ?MRI abdomen  01/31/2020-poorly marginated central liver mass, multiple smaller similar satellite liver masses, extrinsic mass-effect at the biliary hilum with intrahepatic biliary ductal dilatation throughout the left liver with mild Intermatic dilatation the superior right liver, no pancreas mass or ductal dilatation, normal spleen size, numerous enlarged enhancing lymph nodes at the porta hepatis, peripancreatic, portacaval, aortocaval, left periaortic chains ?Cycle 1 gemcitabine/cisplatin 02/04/2020 ?Cycle 2 gemcitabine/cisplatin 02/28/2020 ?Cycle 3 gemcitabine/cisplatin 03/20/2020 ?CT abdomen/pelvis 03/31/2020-mild decrease in central liver tumor, mild decrease in size of liver metastases and upper abdominal adenopathy, new splenomegaly ?Cycle 4 gemcitabine/cisplatin 04/10/2020 ?Cycle 5 gemcitabine/cisplatin 05/01/2020 ?Cycle 6 gemcitabine/cisplatin plus durvalumab 05/22/2020 ?CTs 06/06/2020- dominant central liver mass mildly enlarged, other liver lesions and abdominal adenopathy is stable, no evidence of metastatic disease to the chest ?Cycle 7 gemcitabine/cisplatin plus Durvalumab 06/12/2020 ?Cycle 8 gemcitabine/cisplatin plus Durvalumab 06/30/2020 ?Cycle 9 gemcitabine/cisplatin plus Durvalumab 07/31/2020 ?CT abdomen/pelvis 08/20/2020-decreased size of dominant central liver mass, slight increase in size and stable additional liver lesions, stable portacaval node ?Cycle 10 gemcitabine/cisplatin plus Durvalumab 08/21/2020 ?Cycle 11 gemcitabine/cisplatin plus Durvalumab 09/11/2020 ?Cycle 12 gemcitabine/cisplatin plus Durvalumab 10/02/2020 ?Cycle 13 gemcitabine/cisplatin plus Durvalumab 10/23/2020 ?CT abdomen/pelvis 11/10/2020-no change in dominant central liver mass and additional liver lesions, stable upper retroperitoneal adenopathy ?Cycle 14 gemcitabine/cisplatin plus Durvalumab 11/13/2020 ?Cycle 15 gemcitabine/cisplatin plus Durvalumab 12/04/2020 ?Cycle 15 gemcitabine/cisplatin plus Durvalumab 01/08/2021 ?Cycle 16 gemcitabine/cisplatin plus  Durvalumab 01/30/2021-cisplatin held from day 1 and day 8 secondary to neuropathy ?CTs 02/15/2021-slight increase in size of central liver mass, progressive metastatic lesions in the right liver, stable to slightly decreased periportal lymph nodes, progression of splenomegaly, chronically thrombosed left portal vein ?  Cycle 1 FOLFOX 03/05/2021 ?Cycle 2 FOLFOX 03/19/2021, oxaliplatin dose reduced due to thrombocytopenia ?Cycle 3 FOLFOX 04/02/2021 ?Cycle 4 FOLFOX 04/16/2021 ?Cycle 5 FOLFOX 04/30/2021 ?CTs 05/10/2021-new and enlarging liver metastases, chronic left lobe biliary dilatation, chronic left portal vein thrombosis, stable porta hepatis adenopathy, splenomegaly, no evidence of metastatic disease to the chest ?Ivosidenib 05/21/2021 ?  ?Cough-likely related to diaphragmatic irritation from #1 ?Anorexia/weight loss ?Hypercalcemia-likely hypercalcemia malignancy, status post intravenous hydration and Zometa 01/14/2020 ?Diabetes ?Hyperlipidemia ?Hypertension ?History of peripheral neuropathy secondary to diabetes ?Severe back pain following Fulphila-oxycodone prescribed ?Thrombocytopenia secondary to chemotherapy-gemcitabine held with day 1 cycle 8 and then dose reduced ?COVID-19 infection 07/22/2020, 12/25/2020 ?Cisplatin induced peripheral neuropathy ?  ? ?Disposition: Mr. Viviano appears unchanged.  There is no clinical evidence of disease progression.  He will continue ivosidenib.   ? ?CBC and chemistry panel reviewed.  Labs adequate to continue ivosidenib.  We will follow-up on the EKG from today. ? ?He will return for an EKG in 1 week.  We will see him in follow-up in 2 weeks.  We are available to see him sooner if needed. ? ? ? ?Ned Card ANP/GNP-BC  ? ?06/04/2021  ?11:55 AM ? ? ? ? ? ? ? ?

## 2021-06-04 NOTE — Progress Notes (Signed)
Patient seen by Ned Card NP today ? ?Vitals are within treatment parameters. ? ?Labs reviewed by Ned Card NP  ? ?Per physician team, will only need 1 liter NS today over 2 hours.  ?

## 2021-06-05 ENCOUNTER — Other Ambulatory Visit (HOSPITAL_BASED_OUTPATIENT_CLINIC_OR_DEPARTMENT_OTHER): Payer: Self-pay

## 2021-06-07 ENCOUNTER — Other Ambulatory Visit (HOSPITAL_BASED_OUTPATIENT_CLINIC_OR_DEPARTMENT_OTHER): Payer: Self-pay

## 2021-06-07 ENCOUNTER — Other Ambulatory Visit (HOSPITAL_COMMUNITY): Payer: Self-pay

## 2021-06-11 ENCOUNTER — Other Ambulatory Visit (HOSPITAL_BASED_OUTPATIENT_CLINIC_OR_DEPARTMENT_OTHER): Payer: Self-pay

## 2021-06-11 ENCOUNTER — Other Ambulatory Visit: Payer: Self-pay | Admitting: *Deleted

## 2021-06-11 ENCOUNTER — Other Ambulatory Visit: Payer: Self-pay

## 2021-06-11 ENCOUNTER — Inpatient Hospital Stay: Payer: No Typology Code available for payment source

## 2021-06-11 DIAGNOSIS — C221 Intrahepatic bile duct carcinoma: Secondary | ICD-10-CM

## 2021-06-12 ENCOUNTER — Other Ambulatory Visit (HOSPITAL_BASED_OUTPATIENT_CLINIC_OR_DEPARTMENT_OTHER): Payer: Self-pay

## 2021-06-18 ENCOUNTER — Inpatient Hospital Stay (HOSPITAL_BASED_OUTPATIENT_CLINIC_OR_DEPARTMENT_OTHER): Payer: No Typology Code available for payment source | Admitting: Nurse Practitioner

## 2021-06-18 ENCOUNTER — Other Ambulatory Visit (HOSPITAL_BASED_OUTPATIENT_CLINIC_OR_DEPARTMENT_OTHER): Payer: Self-pay

## 2021-06-18 ENCOUNTER — Other Ambulatory Visit: Payer: Self-pay

## 2021-06-18 ENCOUNTER — Inpatient Hospital Stay: Payer: No Typology Code available for payment source

## 2021-06-18 ENCOUNTER — Encounter: Payer: Self-pay | Admitting: Nurse Practitioner

## 2021-06-18 ENCOUNTER — Other Ambulatory Visit (HOSPITAL_COMMUNITY): Payer: Self-pay

## 2021-06-18 VITALS — BP 136/89 | HR 80 | Temp 98.2°F | Resp 18 | Ht 73.0 in | Wt 240.0 lb

## 2021-06-18 DIAGNOSIS — Z95828 Presence of other vascular implants and grafts: Secondary | ICD-10-CM

## 2021-06-18 DIAGNOSIS — C221 Intrahepatic bile duct carcinoma: Secondary | ICD-10-CM

## 2021-06-18 LAB — CMP (CANCER CENTER ONLY)
ALT: 16 U/L (ref 0–44)
AST: 64 U/L — ABNORMAL HIGH (ref 15–41)
Albumin: 3.3 g/dL — ABNORMAL LOW (ref 3.5–5.0)
Alkaline Phosphatase: 159 U/L — ABNORMAL HIGH (ref 38–126)
Anion gap: 8 (ref 5–15)
BUN: 14 mg/dL (ref 6–20)
CO2: 26 mmol/L (ref 22–32)
Calcium: 9.7 mg/dL (ref 8.9–10.3)
Chloride: 100 mmol/L (ref 98–111)
Creatinine: 0.82 mg/dL (ref 0.61–1.24)
GFR, Estimated: >60 mL/min (ref >60)
Glucose, Bld: 147 mg/dL — ABNORMAL HIGH (ref 70–99)
Potassium: 4.1 mmol/L (ref 3.5–5.1)
Sodium: 134 mmol/L — ABNORMAL LOW (ref 135–145)
Total Bilirubin: 1 mg/dL (ref 0.3–1.2)
Total Protein: 7.9 g/dL (ref 6.5–8.1)

## 2021-06-18 LAB — CBC WITH DIFFERENTIAL (CANCER CENTER ONLY)
Abs Immature Granulocytes: 0.01 10*3/uL (ref 0.00–0.07)
Basophils Absolute: 0 10*3/uL (ref 0.0–0.1)
Basophils Relative: 1 %
Eosinophils Absolute: 0.5 10*3/uL (ref 0.0–0.5)
Eosinophils Relative: 7 %
HCT: 38.8 % — ABNORMAL LOW (ref 39.0–52.0)
Hemoglobin: 12.7 g/dL — ABNORMAL LOW (ref 13.0–17.0)
Immature Granulocytes: 0 %
Lymphocytes Relative: 23 %
Lymphs Abs: 1.4 10*3/uL (ref 0.7–4.0)
MCH: 33 pg (ref 26.0–34.0)
MCHC: 32.7 g/dL (ref 30.0–36.0)
MCV: 100.8 fL — ABNORMAL HIGH (ref 80.0–100.0)
Monocytes Absolute: 0.7 10*3/uL (ref 0.1–1.0)
Monocytes Relative: 11 %
Neutro Abs: 3.7 10*3/uL (ref 1.7–7.7)
Neutrophils Relative %: 58 %
Platelet Count: 140 10*3/uL — ABNORMAL LOW (ref 150–400)
RBC: 3.85 MIL/uL — ABNORMAL LOW (ref 4.22–5.81)
RDW: 14.3 % (ref 11.5–15.5)
WBC Count: 6.3 10*3/uL (ref 4.0–10.5)
nRBC: 0 % (ref 0.0–0.2)

## 2021-06-18 LAB — MAGNESIUM: Magnesium: 2 mg/dL (ref 1.7–2.4)

## 2021-06-18 LAB — PHOSPHORUS: Phosphorus: 3.3 mg/dL (ref 2.5–4.6)

## 2021-06-18 MED ORDER — HEPARIN SOD (PORK) LOCK FLUSH 100 UNIT/ML IV SOLN
500.0000 [IU] | Freq: Once | INTRAVENOUS | Status: AC
  Administered 2021-06-18: 500 [IU] via INTRAVENOUS

## 2021-06-18 MED ORDER — SODIUM CHLORIDE 0.9% FLUSH
10.0000 mL | Freq: Once | INTRAVENOUS | Status: AC
  Administered 2021-06-18: 10 mL via INTRAVENOUS

## 2021-06-18 NOTE — Progress Notes (Signed)
?Matthew ?OFFICE PROGRESS NOTE ? ? ?Diagnosis: Cholangiocarcinoma ? ?INTERVAL HISTORY:  ? ?Matthew Osborne returns as scheduled.  He continues ivosidenib.  He denies nausea/vomiting.  No mouth sores.  No diarrhea.  Some constipation.  No skin rash.  He has periodic sharp pains at the left and right abdomen.  The pain does not occur on a daily basis and resolves without intervention.  Legs feel "rubbery".  Main complaint is fatigue. ? ?Objective: ? ?Vital signs in last 24 hours: ? ?Blood pressure 136/89, pulse 80, temperature 98.2 ?F (36.8 ?C), temperature source Oral, resp. rate 18, height _0  (1.854 m), weight 240 lb (108.9 kg), SpO2 98 %. ?  ? ?HEENT: No thrush or ulcers. ?Resp: Lungs clear bilaterally. ?Cardio: Regular rate and rhythm. ?GI: Abdomen soft and nontender.  No hepatosplenomegaly. ?Vascular: No leg edema. ?Neuro: Lower extremity motor strength 5/5. ?Skin: No rash. ?Port-A-Cath without erythema. ? ? ?Lab Results: ? ?Lab Results  ?Component Value Date  ? WBC 6.3 06/18/2021  ? HGB 12.7 (L) 06/18/2021  ? HCT 38.8 (L) 06/18/2021  ? MCV 100.8 (H) 06/18/2021  ? PLT 140 (L) 06/18/2021  ? NEUTROABS 3.7 06/18/2021  ? ? ?Imaging: ? ?No results found. ? ?Medications: I have reviewed the patient's current medications. ? ?Assessment/Plan: ?Cholangiocarcinoma  multiple liver masses and abdominal lymphadenopathy ?CTs 01/13/2020-rounded hypodense mass appears to arise from the pancreas neck, multiple rim-enhancing masses in the liver, primarily left liver with segmental dilation of the left lobe Intermatic bile ducts, ill-defined hypodensity the central liver with effacement of the left portal vein, enlarged portacaval and retroperitoneal lymph nodes ?Ultrasound-guided biopsy of the left liver lesion 01/19/2020-adenocarcinoma, cytokeratin 7+, MSS, tumor mutation burden 1, IDH1 R132C ?MRI abdomen 01/31/2020-poorly marginated central liver mass, multiple smaller similar satellite liver masses, extrinsic  mass-effect at the biliary hilum with intrahepatic biliary ductal dilatation throughout the left liver with mild Intermatic dilatation the superior right liver, no pancreas mass or ductal dilatation, normal spleen size, numerous enlarged enhancing lymph nodes at the porta hepatis, peripancreatic, portacaval, aortocaval, left periaortic chains ?Cycle 1 gemcitabine/cisplatin 02/04/2020 ?Cycle 2 gemcitabine/cisplatin 02/28/2020 ?Cycle 3 gemcitabine/cisplatin 03/20/2020 ?CT abdomen/pelvis 03/31/2020-mild decrease in central liver tumor, mild decrease in size of liver metastases and upper abdominal adenopathy, new splenomegaly ?Cycle 4 gemcitabine/cisplatin 04/10/2020 ?Cycle 5 gemcitabine/cisplatin 05/01/2020 ?Cycle 6 gemcitabine/cisplatin plus durvalumab 05/22/2020 ?CTs 06/06/2020- dominant central liver mass mildly enlarged, other liver lesions and abdominal adenopathy is stable, no evidence of metastatic disease to the chest ?Cycle 7 gemcitabine/cisplatin plus Durvalumab 06/12/2020 ?Cycle 8 gemcitabine/cisplatin plus Durvalumab 06/30/2020 ?Cycle 9 gemcitabine/cisplatin plus Durvalumab 07/31/2020 ?CT abdomen/pelvis 08/20/2020-decreased size of dominant central liver mass, slight increase in size and stable additional liver lesions, stable portacaval node ?Cycle 10 gemcitabine/cisplatin plus Durvalumab 08/21/2020 ?Cycle 11 gemcitabine/cisplatin plus Durvalumab 09/11/2020 ?Cycle 12 gemcitabine/cisplatin plus Durvalumab 10/02/2020 ?Cycle 13 gemcitabine/cisplatin plus Durvalumab 10/23/2020 ?CT abdomen/pelvis 11/10/2020-no change in dominant central liver mass and additional liver lesions, stable upper retroperitoneal adenopathy ?Cycle 14 gemcitabine/cisplatin plus Durvalumab 11/13/2020 ?Cycle 15 gemcitabine/cisplatin plus Durvalumab 12/04/2020 ?Cycle 15 gemcitabine/cisplatin plus Durvalumab 01/08/2021 ?Cycle 16 gemcitabine/cisplatin plus Durvalumab 01/30/2021-cisplatin held from day 1 and day 8 secondary to neuropathy ?CTs 02/15/2021-slight  increase in size of central liver mass, progressive metastatic lesions in the right liver, stable to slightly decreased periportal lymph nodes, progression of splenomegaly, chronically thrombosed left portal vein ?Cycle 1 FOLFOX 03/05/2021 ?Cycle 2 FOLFOX 03/19/2021, oxaliplatin dose reduced due to thrombocytopenia ?Cycle 3 FOLFOX 04/02/2021 ?Cycle 4 FOLFOX 04/16/2021 ?Cycle 5 FOLFOX 04/30/2021 ?CTs  05/10/2021-new and enlarging liver metastases, chronic left lobe biliary dilatation, chronic left portal vein thrombosis, stable porta hepatis adenopathy, splenomegaly, no evidence of metastatic disease to the chest ?Ivosidenib 05/21/2021 ?  ?Cough-likely related to diaphragmatic irritation from #1 ?Anorexia/weight loss ?Hypercalcemia-likely hypercalcemia malignancy, status post intravenous hydration and Zometa 01/14/2020 ?Diabetes ?Hyperlipidemia ?Hypertension ?History of peripheral neuropathy secondary to diabetes ?Severe back pain following Fulphila-oxycodone prescribed ?Thrombocytopenia secondary to chemotherapy-gemcitabine held with day 1 cycle 8 and then dose reduced ?COVID-19 infection 07/22/2020, 12/25/2020 ?Cisplatin induced peripheral neuropathy ?  ? ?Disposition: Matthew Osborne appears stable.  There is no clinical evidence of disease progression.  He will continue ivosidenib.  We will follow-up on the EKG from today. ? ?CBC and chemistry panel reviewed.  Labs adequate to continue ivosidenib. ? ?He will return for lab and follow-up in 2 weeks.  We are available to see him sooner if needed. ? ? ? ?Ned Card ANP/GNP-BC  ? ?06/18/2021  ?11:51 AM ? ? ? ? ? ? ? ?

## 2021-06-18 NOTE — Patient Instructions (Signed)

## 2021-06-19 ENCOUNTER — Other Ambulatory Visit (HOSPITAL_BASED_OUTPATIENT_CLINIC_OR_DEPARTMENT_OTHER): Payer: Self-pay

## 2021-06-19 ENCOUNTER — Other Ambulatory Visit (HOSPITAL_COMMUNITY): Payer: Self-pay

## 2021-06-20 ENCOUNTER — Other Ambulatory Visit (HOSPITAL_BASED_OUTPATIENT_CLINIC_OR_DEPARTMENT_OTHER): Payer: Self-pay

## 2021-06-20 ENCOUNTER — Other Ambulatory Visit: Payer: Self-pay | Admitting: *Deleted

## 2021-06-20 ENCOUNTER — Other Ambulatory Visit (HOSPITAL_COMMUNITY): Payer: Self-pay

## 2021-06-20 DIAGNOSIS — C221 Intrahepatic bile duct carcinoma: Secondary | ICD-10-CM

## 2021-07-03 ENCOUNTER — Inpatient Hospital Stay: Payer: No Typology Code available for payment source

## 2021-07-03 ENCOUNTER — Inpatient Hospital Stay (HOSPITAL_BASED_OUTPATIENT_CLINIC_OR_DEPARTMENT_OTHER): Payer: No Typology Code available for payment source | Admitting: Oncology

## 2021-07-03 ENCOUNTER — Other Ambulatory Visit: Payer: Self-pay

## 2021-07-03 ENCOUNTER — Other Ambulatory Visit (HOSPITAL_BASED_OUTPATIENT_CLINIC_OR_DEPARTMENT_OTHER): Payer: Self-pay

## 2021-07-03 VITALS — BP 122/74 | HR 67 | Temp 98.1°F | Resp 18 | Ht 73.0 in | Wt 236.0 lb

## 2021-07-03 DIAGNOSIS — C221 Intrahepatic bile duct carcinoma: Secondary | ICD-10-CM

## 2021-07-03 LAB — CBC WITH DIFFERENTIAL (CANCER CENTER ONLY)
Abs Immature Granulocytes: 0.01 10*3/uL (ref 0.00–0.07)
Basophils Absolute: 0.1 10*3/uL (ref 0.0–0.1)
Basophils Relative: 1 %
Eosinophils Absolute: 0.3 10*3/uL (ref 0.0–0.5)
Eosinophils Relative: 5 %
HCT: 37.3 % — ABNORMAL LOW (ref 39.0–52.0)
Hemoglobin: 12 g/dL — ABNORMAL LOW (ref 13.0–17.0)
Immature Granulocytes: 0 %
Lymphocytes Relative: 27 %
Lymphs Abs: 1.4 10*3/uL (ref 0.7–4.0)
MCH: 32.5 pg (ref 26.0–34.0)
MCHC: 32.2 g/dL (ref 30.0–36.0)
MCV: 101.1 fL — ABNORMAL HIGH (ref 80.0–100.0)
Monocytes Absolute: 0.5 10*3/uL (ref 0.1–1.0)
Monocytes Relative: 10 %
Neutro Abs: 2.8 10*3/uL (ref 1.7–7.7)
Neutrophils Relative %: 57 %
Platelet Count: 111 10*3/uL — ABNORMAL LOW (ref 150–400)
RBC: 3.69 MIL/uL — ABNORMAL LOW (ref 4.22–5.81)
RDW: 13.8 % (ref 11.5–15.5)
WBC Count: 5 10*3/uL (ref 4.0–10.5)
nRBC: 0 % (ref 0.0–0.2)

## 2021-07-03 LAB — CMP (CANCER CENTER ONLY)
ALT: 17 U/L (ref 0–44)
AST: 63 U/L — ABNORMAL HIGH (ref 15–41)
Albumin: 3.1 g/dL — ABNORMAL LOW (ref 3.5–5.0)
Alkaline Phosphatase: 141 U/L — ABNORMAL HIGH (ref 38–126)
Anion gap: 7 (ref 5–15)
BUN: 18 mg/dL (ref 6–20)
CO2: 27 mmol/L (ref 22–32)
Calcium: 9.6 mg/dL (ref 8.9–10.3)
Chloride: 100 mmol/L (ref 98–111)
Creatinine: 0.82 mg/dL (ref 0.61–1.24)
GFR, Estimated: >60 mL/min (ref >60)
Glucose, Bld: 160 mg/dL — ABNORMAL HIGH (ref 70–99)
Potassium: 4 mmol/L (ref 3.5–5.1)
Sodium: 134 mmol/L — ABNORMAL LOW (ref 135–145)
Total Bilirubin: 0.9 mg/dL (ref 0.3–1.2)
Total Protein: 7.3 g/dL (ref 6.5–8.1)

## 2021-07-03 LAB — PHOSPHORUS: Phosphorus: 2.5 mg/dL (ref 2.5–4.6)

## 2021-07-03 LAB — MAGNESIUM: Magnesium: 2.1 mg/dL (ref 1.7–2.4)

## 2021-07-03 MED ORDER — SODIUM CHLORIDE 0.9% FLUSH
10.0000 mL | INTRAVENOUS | Status: DC | PRN
  Administered 2021-07-03: 10 mL via INTRAVENOUS

## 2021-07-03 MED ORDER — LIDOCAINE-PRILOCAINE 2.5-2.5 % EX CREA
TOPICAL_CREAM | CUTANEOUS | 2 refills | Status: AC
  Filled 2021-07-03: qty 30, 10d supply, fill #0
  Filled 2021-07-27: qty 30, 10d supply, fill #1
  Filled 2021-09-07: qty 30, 10d supply, fill #2

## 2021-07-03 MED ORDER — HEPARIN SOD (PORK) LOCK FLUSH 100 UNIT/ML IV SOLN
500.0000 [IU] | Freq: Once | INTRAVENOUS | Status: AC
  Administered 2021-07-03: 500 [IU] via INTRAVENOUS

## 2021-07-03 NOTE — Progress Notes (Signed)
[facility] OFFICE PROGRESS NOTE   Diagnosis: Cholangiocarcinoma  INTERVAL HISTORY:   Mr. Karman returns as scheduled.  He continues ivosidenib.  No rash, nausea, or diarrhea.  He reports malaise.  No new complaint.  His blood sugars under better control since starting an insulin pump.  Objective:  Vital signs in last 24 hours:  Blood pressure 122/74, pulse 67, temperature 98.1 F (36.7 C), temperature source Oral, resp. rate 18, height $RemoveBe'6\' 1"'SIhlPBrnD$  (1.854 m), weight 236 lb (107 kg), SpO2 96 %.    Resp: Lungs clear bilaterally Cardio: Regular rate and rhythm GI: No hepatosplenomegaly, nontender Vascular: No leg edema  Skin: No rash  Portacath/PICC-without erythema  Lab Results:  Lab Results  Component Value Date   WBC 6.3 06/18/2021   HGB 12.7 (L) 06/18/2021   HCT 38.8 (L) 06/18/2021   MCV 100.8 (H) 06/18/2021   PLT 140 (L) 06/18/2021   NEUTROABS 3.7 06/18/2021    CMP  Lab Results  Component Value Date   NA 134 (L) 06/18/2021   K 4.1 06/18/2021   CL 100 06/18/2021   CO2 26 06/18/2021   GLUCOSE 147 (H) 06/18/2021   BUN 14 06/18/2021   CREATININE 0.82 06/18/2021   CALCIUM 9.7 06/18/2021   PROT 7.9 06/18/2021   ALBUMIN 3.3 (L) 06/18/2021   AST 64 (H) 06/18/2021   ALT 16 06/18/2021   ALKPHOS 159 (H) 06/18/2021   BILITOT 1.0 06/18/2021   GFRNONAA >60 06/18/2021   GFRAA 127 01/11/2020    Lab Results  Component Value Date   CEA1 2.15 01/14/2020   [id number] 82 (H) 08/21/2020     Medications: I have reviewed the patient's current medications.   Assessment/Plan: Cholangiocarcinoma  multiple liver masses and abdominal lymphadenopathy CTs 01/13/2020-rounded hypodense mass appears to arise from the pancreas neck, multiple rim-enhancing masses in the liver, primarily left liver with segmental dilation of the left lobe Intermatic bile ducts, ill-defined hypodensity the central liver with effacement of the left portal vein, enlarged portacaval and  retroperitoneal lymph nodes Ultrasound-guided biopsy of the left liver lesion 01/19/2020-adenocarcinoma, cytokeratin 7+, MSS, tumor mutation burden 1, IDH1 R132C MRI abdomen 01/31/2020-poorly marginated central liver mass, multiple smaller similar satellite liver masses, extrinsic mass-effect at the biliary hilum with intrahepatic biliary ductal dilatation throughout the left liver with mild Intermatic dilatation the superior right liver, no pancreas mass or ductal dilatation, normal spleen size, numerous enlarged enhancing lymph nodes at the porta hepatis, peripancreatic, portacaval, aortocaval, left periaortic chains Cycle 1 gemcitabine/cisplatin 02/04/2020 Cycle 2 gemcitabine/cisplatin 02/28/2020 Cycle 3 gemcitabine/cisplatin 03/20/2020 CT abdomen/pelvis 03/31/2020-mild decrease in central liver tumor, mild decrease in size of liver metastases and upper abdominal adenopathy, new splenomegaly Cycle 4 gemcitabine/cisplatin 04/10/2020 Cycle 5 gemcitabine/cisplatin 05/01/2020 Cycle 6 gemcitabine/cisplatin plus durvalumab 05/22/2020 CTs 06/06/2020- dominant central liver mass mildly enlarged, other liver lesions and abdominal adenopathy is stable, no evidence of metastatic disease to the chest Cycle 7 gemcitabine/cisplatin plus Durvalumab 06/12/2020 Cycle 8 gemcitabine/cisplatin plus Durvalumab 06/30/2020 Cycle 9 gemcitabine/cisplatin plus Durvalumab 07/31/2020 CT abdomen/pelvis 08/20/2020-decreased size of dominant central liver mass, slight increase in size and stable additional liver lesions, stable portacaval node Cycle 10 gemcitabine/cisplatin plus Durvalumab 08/21/2020 Cycle 11 gemcitabine/cisplatin plus Durvalumab 09/11/2020 Cycle 12 gemcitabine/cisplatin plus Durvalumab 10/02/2020 Cycle 13 gemcitabine/cisplatin plus Durvalumab 10/23/2020 CT abdomen/pelvis 11/10/2020-no change in dominant central liver mass and additional liver lesions, stable upper retroperitoneal adenopathy Cycle 14 gemcitabine/cisplatin plus  Durvalumab 11/13/2020 Cycle 15 gemcitabine/cisplatin plus Durvalumab 12/04/2020 Cycle 15 gemcitabine/cisplatin plus Durvalumab 01/08/2021 Cycle 16 gemcitabine/cisplatin plus  Durvalumab 01/30/2021-cisplatin held from day 1 and day 8 secondary to neuropathy CTs 02/15/2021-slight increase in size of central liver mass, progressive metastatic lesions in the right liver, stable to slightly decreased periportal lymph nodes, progression of splenomegaly, chronically thrombosed left portal vein Cycle 1 FOLFOX 03/05/2021 Cycle 2 FOLFOX 03/19/2021, oxaliplatin dose reduced due to thrombocytopenia Cycle 3 FOLFOX 04/02/2021 Cycle 4 FOLFOX 04/16/2021 Cycle 5 FOLFOX 04/30/2021 CTs 05/10/2021-new and enlarging liver metastases, chronic left lobe biliary dilatation, chronic left portal vein thrombosis, stable porta hepatis adenopathy, splenomegaly, no evidence of metastatic disease to the chest Ivosidenib 05/21/2021   Cough-likely related to diaphragmatic irritation from #1 Anorexia/weight loss Hypercalcemia-likely hypercalcemia malignancy, status post intravenous hydration and Zometa 01/14/2020 Diabetes Hyperlipidemia Hypertension History of peripheral neuropathy secondary to diabetes Severe back pain following Fulphila-oxycodone prescribed Thrombocytopenia secondary to chemotherapy-gemcitabine held with day 1 cycle 8 and then dose reduced COVID-19 infection 07/22/2020, 12/25/2020 Cisplatin induced peripheral neuropathy     Disposition: Mr. Sylla appears stable.  He is tolerating the ivosidenib well.  We will follow-up on the CBC and chemistry panel from today.  He will return for an office visit and EKG in 2 weeks.  Betsy Coder, MD  07/03/2021  8:25 AM

## 2021-07-04 ENCOUNTER — Other Ambulatory Visit (HOSPITAL_COMMUNITY): Payer: Self-pay

## 2021-07-04 LAB — CANCER ANTIGEN 19-9: CA 19-9: 123 U/mL — ABNORMAL HIGH (ref 0–35)

## 2021-07-06 ENCOUNTER — Other Ambulatory Visit (HOSPITAL_BASED_OUTPATIENT_CLINIC_OR_DEPARTMENT_OTHER): Payer: Self-pay

## 2021-07-06 ENCOUNTER — Encounter: Payer: Self-pay | Admitting: *Deleted

## 2021-07-06 ENCOUNTER — Other Ambulatory Visit: Payer: Self-pay | Admitting: Family Medicine

## 2021-07-06 MED ORDER — BISOPROLOL-HYDROCHLOROTHIAZIDE 10-6.25 MG PO TABS
ORAL_TABLET | ORAL | 0 refills | Status: DC
  Filled 2021-07-06: qty 90, 90d supply, fill #0

## 2021-07-06 NOTE — Telephone Encounter (Signed)
Requested medication (s) are due for refill today: yes  Requested medication (s) are on the active medication list: yes  Last refill:  05/08/21 #30  Future visit scheduled: no  Notes to clinic:  med not delegated to NT to RF   Requested Prescriptions  Pending Prescriptions Disp Refills   ALPRAZolam (XANAX) 0.5 MG tablet 30 tablet     Sig: Take 1 tablet (0.5 mg total) by mouth 3 (three) times daily as needed.     Not Delegated - Psychiatry: Anxiolytics/Hypnotics 2 Failed - 07/06/2021 11:12 AM      Failed - This refill cannot be delegated      Failed - Urine Drug Screen completed in last 360 days      Failed - Valid encounter within last 6 months    Recent Outpatient Visits           1 month ago General medical exam   [place] Susy Frizzle, MD   1 year ago Type II diabetes mellitus with complication, uncontrolled ([place])   [place] Pickard, Cammie Mcgee, MD   1 year ago Cough   [place] Pickard, Cammie Mcgee, MD   1 year ago [place] [facility] Schaumann, Cammie Mcgee, MD   1 year ago Bee sting, accidental or unintentional, subsequent encounter   [place], [place], [place] - Patient is not pregnant      Signed Prescriptions Disp Refills   bisoprolol-hydrochlorothiazide (ZIAC) 10-6.25 MG tablet 90 tablet 0    Sig: TAKE 1 TABLET BY MOUTH EVERY DAY IN THE MORNING     Cardiovascular: Beta Blocker + Diuretic Combos Failed - 07/06/2021 11:12 AM      Failed - Na in normal range and within 180 days    Sodium  Date Value Ref Range Status  07/03/2021 134 (L) 135 - 145 mmol/L Final         Failed - Valid encounter within last 6 months    Recent Outpatient Visits           1 month ago General medical exam   [place] Susy Frizzle, MD   1 year ago Type II diabetes mellitus with complication, uncontrolled ([place])   [place]  [facility] Pick[facility], Cammie Mcgee, MD   1 year ago Cough   [place] Pickard, Cammie Mcgee, MD   1 year ago [place] Susy Frizzle, MD   1 year ago Bee sting, accidental or unintentional, subsequent encounter   [place], [place], FNP               Passed - K in normal range and within 180 days    Potassium  Date Value Ref Range Status  07/03/2021 4.0 3.5 - 5.1 mmol/L Final         Passed - Cr in normal range and within 180 days    Creatinine  Date Value Ref Range Status  07/03/2021 0.82 0.61 - 1.24 mg/dL Final   Creat  Date Value Ref Range Status  01/11/2020 0.78 0.60 - 1.35 mg/dL Final   Creatinine, Urine  Date Value Ref Range Status  07/16/2018 164 20 - 320 mg/dL Final         Passed - eGFR in normal range and  within 180 days    GFR, Est African American  Date Value Ref Range Status  01/11/2020 127 > OR = 60 mL/min/1.23m Final   GFR, Est Non African American  Date Value Ref Range Status  01/11/2020 110 > OR = 60 mL/min/1.714mFinal   GFR, Estimated  Date Value Ref Range Status  07/03/2021 >60 >60 mL/min Final    Comment:    (NOTE) Calculated using the CKD-EPI Creatinine Equation (2021)          Passed - Last BP in normal range    BP Readings from Last 1 Encounters:  07/03/21 122/74         Passed - Last Heart Rate in normal range    Pulse Readings from Last 1 Encounters:  07/03/21 67

## 2021-07-06 NOTE — Progress Notes (Signed)
Faxed 5/30 office note, labs, med list and next appointment date to [facility] [phone]

## 2021-07-06 NOTE — Telephone Encounter (Signed)
Requested Prescriptions  Pending Prescriptions Disp Refills  . ALPRAZolam (XANAX) 0.5 MG tablet 30 tablet     Sig: Take 1 tablet (0.5 mg total) by mouth 3 (three) times daily as needed.     Not Delegated - Psychiatry: Anxiolytics/Hypnotics 2 Failed - 07/06/2021 11:12 AM      Failed - This refill cannot be delegated      Failed - Urine Drug Screen completed in last 360 days      Failed - Valid encounter within last 6 months    Recent Outpatient Visits          1 month ago General medical exam   [place] Susy Frizzle, MD   1 year ago Type II diabetes mellitus with complication, uncontrolled ([place])   [place] Pickard, Cammie Mcgee, MD   1 year ago Cough   [place] Pickard, Cammie Mcgee, MD   1 year ago [place], Cammie Mcgee, MD   1 year ago Bee sting, accidental or unintentional, subsequent encounter   [place], [place], FNP             Passed - Patient is not pregnant      . bisoprolol-hydrochlorothiazide (ZIAC) 10-6.25 MG tablet 90 tablet 0    Sig: TAKE 1 TABLET BY MOUTH EVERY DAY IN THE MORNING     Cardiovascular: Beta Blocker + Diuretic Combos Failed - 07/06/2021 11:12 AM      Failed - Na in normal range and within 180 days    Sodium  Date Value Ref Range Status  07/03/2021 134 (L) 135 - 145 mmol/L Final         Failed - Valid encounter within last 6 months    Recent Outpatient Visits          1 month ago General medical exam   [place] Susy Frizzle, MD   1 year ago Type II diabetes mellitus with complication, uncontrolled ([place])   [place] Pickard, Cammie Mcgee, MD   1 year ago Cough   [place] Susy Frizzle, MD   1 year ago [place] Susy Frizzle, MD   1 year ago Bee sting, accidental or unintentional, subsequent encounter   [place], [place], FNP             Passed - K in normal range and within 180 days    Potassium  Date Value Ref Range Status  07/03/2021 4.0 3.5 - 5.1 mmol/L Final         Passed - Cr in normal range and within 180 days    Creatinine  Date Value Ref Range Status  07/03/2021 0.82 0.61 - 1.24 mg/dL Final   Creat  Date Value Ref Range Status  01/11/2020 0.78 0.60 - 1.35 mg/dL Final   Creatinine, Urine  Date Value Ref Range Status  07/16/2018 164 20 - 320 mg/dL Final         Passed - eGFR in normal range and within 180 days    GFR, Est African American  Date Value Ref Range Status  01/11/2020 127 > OR = 60 mL/min/1.74m2 Final   GFR, Est Non African American  Date Value Ref Range Status  01/11/2020 110 > OR = 60 mL/min/1.36m2 Final   GFR, Estimated  Date Value Ref Range  Status  07/03/2021 >60 >60 mL/min Final    Comment:    (NOTE) Calculated using the CKD-EPI Creatinine Equation (2021)          Passed - Last BP in normal range    BP Readings from Last 1 Encounters:  07/03/21 122/74         Passed - Last Heart Rate in normal range    Pulse Readings from Last 1 Encounters:  07/03/21 67

## 2021-07-09 ENCOUNTER — Other Ambulatory Visit (HOSPITAL_COMMUNITY): Payer: Self-pay

## 2021-07-09 ENCOUNTER — Other Ambulatory Visit (HOSPITAL_BASED_OUTPATIENT_CLINIC_OR_DEPARTMENT_OTHER): Payer: Self-pay

## 2021-07-09 ENCOUNTER — Other Ambulatory Visit: Payer: Self-pay | Admitting: Oncology

## 2021-07-09 DIAGNOSIS — C221 Intrahepatic bile duct carcinoma: Secondary | ICD-10-CM

## 2021-07-09 MED ORDER — TIBSOVO 250 MG PO TABS
500.0000 mg | ORAL_TABLET | Freq: Every day | ORAL | 1 refills | Status: DC
  Filled 2021-07-09: qty 60, 30d supply, fill #0
  Filled 2021-08-02: qty 60, 30d supply, fill #1

## 2021-07-12 ENCOUNTER — Telehealth: Payer: Self-pay

## 2021-07-12 ENCOUNTER — Inpatient Hospital Stay: Payer: No Typology Code available for payment source | Attending: Oncology | Admitting: Nurse Practitioner

## 2021-07-12 ENCOUNTER — Encounter: Payer: Self-pay | Admitting: Nurse Practitioner

## 2021-07-12 VITALS — BP 150/91 | HR 76 | Temp 98.1°F | Resp 18 | Wt 239.2 lb

## 2021-07-12 DIAGNOSIS — I10 Essential (primary) hypertension: Secondary | ICD-10-CM | POA: Diagnosis not present

## 2021-07-12 DIAGNOSIS — R630 Anorexia: Secondary | ICD-10-CM | POA: Diagnosis not present

## 2021-07-12 DIAGNOSIS — R059 Cough, unspecified: Secondary | ICD-10-CM | POA: Diagnosis not present

## 2021-07-12 DIAGNOSIS — D6959 Other secondary thrombocytopenia: Secondary | ICD-10-CM | POA: Diagnosis not present

## 2021-07-12 DIAGNOSIS — E1142 Type 2 diabetes mellitus with diabetic polyneuropathy: Secondary | ICD-10-CM | POA: Diagnosis not present

## 2021-07-12 DIAGNOSIS — C221 Intrahepatic bile duct carcinoma: Secondary | ICD-10-CM | POA: Diagnosis present

## 2021-07-12 DIAGNOSIS — M549 Dorsalgia, unspecified: Secondary | ICD-10-CM | POA: Insufficient documentation

## 2021-07-12 DIAGNOSIS — E785 Hyperlipidemia, unspecified: Secondary | ICD-10-CM | POA: Insufficient documentation

## 2021-07-12 NOTE — Telephone Encounter (Signed)
Patient called in and stated he have a cough. In the last 3 or 4 days the cough got worse and its calling him to go to the ground and winded . Patient denied of any other symptoms. I spoke with the provider about the patient issue and she agreed for him to come in today 1. I called the patient to let him know to come in at 1.

## 2021-07-12 NOTE — Progress Notes (Signed)
[facility] OFFICE PROGRESS NOTE   Diagnosis: Cholangiocarcinoma  INTERVAL HISTORY:   Matthew Osborne returns prior to scheduled follow-up for evaluation of a cough.  He continues ivosidenib.  He reports an increased cough over the past several days.  The cough is nonproductive.  He becomes short of breath with coughing.  He had a temperature of 99.5 a few days ago.  He tried Ultram with partial improvement in the cough.  No chest pain.  No leg swelling or calf pain.  He has periodic dysphagia.  He has intermittent generalized pruritus.  Objective:  Vital signs in last 24 hours:  Blood pressure (!) 150/91, pulse 76, temperature 98.1 F (36.7 C), temperature source Tympanic, resp. rate 18, weight 239 lb 3.2 oz (108.5 kg), SpO2 98 %.   HEENT: Sclera anicteric. Resp: Lungs are clear.  Breath sounds diminished right lower lung field.  No respiratory distress.  Frequent coughing during the exam. Cardio: Regular rate and rhythm. GI: Abdomen is soft.  Firm fullness right abdomen. Vascular: No leg edema.  Calves soft and nontender. Skin: No rash. Port-A-Cath without erythema.  Lab Results:  Lab Results  Component Value Date   WBC 5.0 07/03/2021   HGB 12.0 (L) 07/03/2021   HCT 37.3 (L) 07/03/2021   MCV 101.1 (H) 07/03/2021   PLT 111 (L) 07/03/2021   NEUTROABS 2.8 07/03/2021    Imaging:  No results found.  Medications: I have reviewed the patient's current medications.  Assessment/Plan: Cholangiocarcinoma  multiple liver masses and abdominal lymphadenopathy CTs 01/13/2020-rounded hypodense mass appears to arise from the pancreas neck, multiple rim-enhancing masses in the liver, primarily left liver with segmental dilation of the left lobe Intermatic bile ducts, ill-defined hypodensity the central liver with effacement of the left portal vein, enlarged portacaval and retroperitoneal lymph nodes Ultrasound-guided biopsy of the left liver lesion 01/19/2020-adenocarcinoma,  cytokeratin 7+, MSS, tumor mutation burden 1, IDH1 R132C MRI abdomen 01/31/2020-poorly marginated central liver mass, multiple smaller similar satellite liver masses, extrinsic mass-effect at the biliary hilum with intrahepatic biliary ductal dilatation throughout the left liver with mild Intermatic dilatation the superior right liver, no pancreas mass or ductal dilatation, normal spleen size, numerous enlarged enhancing lymph nodes at the porta hepatis, peripancreatic, portacaval, aortocaval, left periaortic chains Cycle 1 gemcitabine/cisplatin 02/04/2020 Cycle 2 gemcitabine/cisplatin 02/28/2020 Cycle 3 gemcitabine/cisplatin 03/20/2020 CT abdomen/pelvis 03/31/2020-mild decrease in central liver tumor, mild decrease in size of liver metastases and upper abdominal adenopathy, new splenomegaly Cycle 4 gemcitabine/cisplatin 04/10/2020 Cycle 5 gemcitabine/cisplatin 05/01/2020 Cycle 6 gemcitabine/cisplatin plus durvalumab 05/22/2020 CTs 06/06/2020- dominant central liver mass mildly enlarged, other liver lesions and abdominal adenopathy is stable, no evidence of metastatic disease to the chest Cycle 7 gemcitabine/cisplatin plus Durvalumab 06/12/2020 Cycle 8 gemcitabine/cisplatin plus Durvalumab 06/30/2020 Cycle 9 gemcitabine/cisplatin plus Durvalumab 07/31/2020 CT abdomen/pelvis 08/20/2020-decreased size of dominant central liver mass, slight increase in size and stable additional liver lesions, stable portacaval node Cycle 10 gemcitabine/cisplatin plus Durvalumab 08/21/2020 Cycle 11 gemcitabine/cisplatin plus Durvalumab 09/11/2020 Cycle 12 gemcitabine/cisplatin plus Durvalumab 10/02/2020 Cycle 13 gemcitabine/cisplatin plus Durvalumab 10/23/2020 CT abdomen/pelvis 11/10/2020-no change in dominant central liver mass and additional liver lesions, stable upper retroperitoneal adenopathy Cycle 14 gemcitabine/cisplatin plus Durvalumab 11/13/2020 Cycle 15 gemcitabine/cisplatin plus Durvalumab 12/04/2020 Cycle 15  gemcitabine/cisplatin plus Durvalumab 01/08/2021 Cycle 16 gemcitabine/cisplatin plus Durvalumab 01/30/2021-cisplatin held from day 1 and day 8 secondary to neuropathy CTs 02/15/2021-slight increase in size of central liver mass, progressive metastatic lesions in the right liver, stable to slightly decreased periportal lymph nodes, progression of  splenomegaly, chronically thrombosed left portal vein Cycle 1 FOLFOX 03/05/2021 Cycle 2 FOLFOX 03/19/2021, oxaliplatin dose reduced due to thrombocytopenia Cycle 3 FOLFOX 04/02/2021 Cycle 4 FOLFOX 04/16/2021 Cycle 5 FOLFOX 04/30/2021 CTs 05/10/2021-new and enlarging liver metastases, chronic left lobe biliary dilatation, chronic left portal vein thrombosis, stable porta hepatis adenopathy, splenomegaly, no evidence of metastatic disease to the chest Ivosidenib 05/21/2021   Cough-likely related to diaphragmatic irritation from #1 Anorexia/weight loss Hypercalcemia-likely hypercalcemia malignancy, status post intravenous hydration and Zometa 01/14/2020 Diabetes Hyperlipidemia Hypertension History of peripheral neuropathy secondary to diabetes Severe back pain following Fulphila-oxycodone prescribed Thrombocytopenia secondary to chemotherapy-gemcitabine held with day 1 cycle 8 and then dose reduced COVID-19 infection 07/22/2020, 12/25/2020 Cisplatin induced peripheral neuropathy  Disposition: Matthew Osborne appears stable.  He presents today for an unscheduled visit with recent recurrence of a cough.  Etiology is unclear.  We discussed the possibility of diaphragmatic irritation related to liver metastases.  The cough is partially relieved with tramadol.  We are referring him for CT scans.  He understands to contact the office with fever, shortness of breath.  He will follow-up as scheduled 07/16/2021.  Patient seen with Dr. Benay Spice.    Ned Card ANP/GNP-BC   07/12/2021  2:34 PM  This was a shared visit with Ned Card.  Matthew Osborne was interviewed and  examined.  He presents with an increased cough.  He had a cough when he initially presented with cholangiocarcinoma.  We decided to proceed with restaging CTs.  We have a low clinical suspicion for an infectious process or pulmonary embolism.  I was present for greater than 50% of today's visit.  I performed medical decision making.  Julieanne Manson, MD

## 2021-07-13 ENCOUNTER — Other Ambulatory Visit (HOSPITAL_COMMUNITY): Payer: Self-pay

## 2021-07-13 ENCOUNTER — Encounter: Payer: Self-pay | Admitting: Oncology

## 2021-07-13 ENCOUNTER — Ambulatory Visit (HOSPITAL_BASED_OUTPATIENT_CLINIC_OR_DEPARTMENT_OTHER)
Admission: RE | Admit: 2021-07-13 | Discharge: 2021-07-13 | Disposition: A | Discharge status: Home / Self Care | Payer: No Typology Code available for payment source | Source: Ambulatory Visit | Attending: Nurse Practitioner | Admitting: Nurse Practitioner

## 2021-07-13 DIAGNOSIS — C221 Intrahepatic bile duct carcinoma: Secondary | ICD-10-CM | POA: Insufficient documentation

## 2021-07-13 MED ORDER — IOHEXOL 300 MG/ML  SOLN
100.0000 mL | Freq: Once | INTRAMUSCULAR | Status: AC | PRN
  Administered 2021-07-13: 100 mL via INTRAVENOUS

## 2021-07-15 MED ORDER — ALPRAZOLAM 0.5 MG PO TABS
0.5000 mg | ORAL_TABLET | Freq: Three times a day (TID) | ORAL | 3 refills | Status: DC | PRN
  Filled 2021-07-15: qty 30, 10d supply, fill #0

## 2021-07-16 ENCOUNTER — Encounter: Payer: Self-pay | Admitting: *Deleted

## 2021-07-16 ENCOUNTER — Inpatient Hospital Stay: Payer: No Typology Code available for payment source

## 2021-07-16 ENCOUNTER — Other Ambulatory Visit: Payer: Self-pay

## 2021-07-16 ENCOUNTER — Other Ambulatory Visit: Payer: Self-pay | Admitting: *Deleted

## 2021-07-16 ENCOUNTER — Other Ambulatory Visit (HOSPITAL_BASED_OUTPATIENT_CLINIC_OR_DEPARTMENT_OTHER): Payer: Self-pay

## 2021-07-16 ENCOUNTER — Inpatient Hospital Stay (HOSPITAL_BASED_OUTPATIENT_CLINIC_OR_DEPARTMENT_OTHER): Payer: No Typology Code available for payment source | Admitting: Oncology

## 2021-07-16 VITALS — BP 125/83 | HR 66 | Temp 98.1°F | Resp 18 | Ht 73.0 in | Wt 236.0 lb

## 2021-07-16 DIAGNOSIS — C221 Intrahepatic bile duct carcinoma: Secondary | ICD-10-CM | POA: Diagnosis not present

## 2021-07-16 LAB — CBC WITH DIFFERENTIAL (CANCER CENTER ONLY)
Abs Immature Granulocytes: 0.01 10*3/uL (ref 0.00–0.07)
Basophils Absolute: 0.1 10*3/uL (ref 0.0–0.1)
Basophils Relative: 1 %
Eosinophils Absolute: 0.4 10*3/uL (ref 0.0–0.5)
Eosinophils Relative: 7 %
HCT: 38.7 % — ABNORMAL LOW (ref 39.0–52.0)
Hemoglobin: 12.6 g/dL — ABNORMAL LOW (ref 13.0–17.0)
Immature Granulocytes: 0 %
Lymphocytes Relative: 22 %
Lymphs Abs: 1.1 10*3/uL (ref 0.7–4.0)
MCH: 32.6 pg (ref 26.0–34.0)
MCHC: 32.6 g/dL (ref 30.0–36.0)
MCV: 100.3 fL — ABNORMAL HIGH (ref 80.0–100.0)
Monocytes Absolute: 0.5 10*3/uL (ref 0.1–1.0)
Monocytes Relative: 10 %
Neutro Abs: 3 10*3/uL (ref 1.7–7.7)
Neutrophils Relative %: 60 %
Platelet Count: 129 10*3/uL — ABNORMAL LOW (ref 150–400)
RBC: 3.86 MIL/uL — ABNORMAL LOW (ref 4.22–5.81)
RDW: 13.8 % (ref 11.5–15.5)
WBC Count: 5 10*3/uL (ref 4.0–10.5)
nRBC: 0 % (ref 0.0–0.2)

## 2021-07-16 LAB — CMP (CANCER CENTER ONLY)
ALT: 18 U/L (ref 0–44)
AST: 65 U/L — ABNORMAL HIGH (ref 15–41)
Albumin: 3.3 g/dL — ABNORMAL LOW (ref 3.5–5.0)
Alkaline Phosphatase: 149 U/L — ABNORMAL HIGH (ref 38–126)
Anion gap: 8 (ref 5–15)
BUN: 13 mg/dL (ref 6–20)
CO2: 27 mmol/L (ref 22–32)
Calcium: 10.1 mg/dL (ref 8.9–10.3)
Chloride: 102 mmol/L (ref 98–111)
Creatinine: 0.81 mg/dL (ref 0.61–1.24)
GFR, Estimated: >60 mL/min (ref >60)
Glucose, Bld: 124 mg/dL — ABNORMAL HIGH (ref 70–99)
Potassium: 3.7 mmol/L (ref 3.5–5.1)
Sodium: 137 mmol/L (ref 135–145)
Total Bilirubin: 1.4 mg/dL — ABNORMAL HIGH (ref 0.3–1.2)
Total Protein: 7.4 g/dL (ref 6.5–8.1)

## 2021-07-16 LAB — MAGNESIUM: Magnesium: 2.1 mg/dL (ref 1.7–2.4)

## 2021-07-16 LAB — PHOSPHORUS: Phosphorus: 2.7 mg/dL (ref 2.5–4.6)

## 2021-07-16 MED ORDER — HEPARIN SOD (PORK) LOCK FLUSH 100 UNIT/ML IV SOLN
500.0000 [IU] | Freq: Once | INTRAVENOUS | Status: AC
  Administered 2021-07-16: 500 [IU] via INTRAVENOUS

## 2021-07-16 MED ORDER — SODIUM CHLORIDE 0.9% FLUSH
10.0000 mL | Freq: Once | INTRAVENOUS | Status: AC
  Administered 2021-07-16: 10 mL via INTRAVENOUS

## 2021-07-16 MED ORDER — PROCHLORPERAZINE MALEATE 10 MG PO TABS
ORAL_TABLET | Freq: Four times a day (QID) | ORAL | 1 refills | Status: DC | PRN
  Filled 2021-07-16: qty 60, 15d supply, fill #0
  Filled 2021-09-07: qty 60, 15d supply, fill #1

## 2021-07-16 NOTE — Progress Notes (Signed)
PATIENT NAVIGATOR PROGRESS NOTE  Name: Matthew Osborne Date: 07/16/2021 MRN: [id number]  DOB: Sep 03, 1975  Comments:  referral called to [facility] with Dr Karmen Stabs,  Appt is scheduled for 6/29 at 3 pm    Time spent counseling/coordinating care: 15-30 minutes

## 2021-07-16 NOTE — Progress Notes (Signed)
[place] OFFICE PROGRESS NOTE   Diagnosis: Cholangiocarcinoma  INTERVAL HISTORY:   Mr. Coronado returns as scheduled.  He reports marked improvement in his cough.  Good appetite.  No new complaint.  He is here today with his wife.  He continues ivosidenib.  Objective:  Vital signs in last 24 hours:  Blood pressure 125/83, pulse 66, temperature 98.1 F (36.7 C), resp. rate 18, height $RemoveBe'6\' 1"'FzbBizUyh$  (1.854 m), weight 236 lb (107 kg), SpO2 98 %.    Resp: Lungs clear bilaterally Cardio: Regular rate and rhythm GI: Nontender, fullness in the right upper lateral abdomen without a discrete mass, no hepatosplenomegaly Vascular: No leg edema    Portacath/PICC-without erythema  Lab Results:  Lab Results  Component Value Date   WBC 5.0 07/16/2021   HGB 12.6 (L) 07/16/2021   HCT 38.7 (L) 07/16/2021   MCV 100.3 (H) 07/16/2021   PLT 129 (L) 07/16/2021   NEUTROABS 3.0 07/16/2021    CMP  Lab Results  Component Value Date   NA 134 (L) 07/03/2021   K 4.0 07/03/2021   CL 100 07/03/2021   CO2 27 07/03/2021   GLUCOSE 160 (H) 07/03/2021   BUN 18 07/03/2021   CREATININE 0.82 07/03/2021   CALCIUM 9.6 07/03/2021   PROT 7.3 07/03/2021   ALBUMIN 3.1 (L) 07/03/2021   AST 63 (H) 07/03/2021   ALT 17 07/03/2021   ALKPHOS 141 (H) 07/03/2021   BILITOT 0.9 07/03/2021   GFRNONAA >60 07/03/2021   GFRAA 127 01/11/2020    Lab Results  Component Value Date   CEA1 2.15 01/14/2020   [id number] 123 (H) 07/03/2021    Imaging:  CT CHEST ABDOMEN PELVIS W CONTRAST  Result Date: 07/13/2021 CLINICAL DATA:  Stage IV biliary cancer, cough, assess treatment response. * Tracking Code: BO * EXAM: CT CHEST, ABDOMEN, AND PELVIS WITH CONTRAST TECHNIQUE: Multidetector CT imaging of the chest, abdomen and pelvis was performed following the standard protocol during bolus administration of intravenous contrast. RADIATION DOSE REDUCTION: This exam was performed according to the departmental  dose-optimization program which includes automated exposure control, adjustment of the mA and/or kV according to patient size and/or use of iterative reconstruction technique. CONTRAST:  128mL OMNIPAQUE IOHEXOL 300 MG/ML  SOLN COMPARISON:  05/10/2021. FINDINGS: CT CHEST FINDINGS Cardiovascular: Right IJ Port-A-Cath terminates at the SVC RA junction. Atherosclerotic calcification of the aorta, aortic valve and coronary arteries. Heart size normal. No pericardial effusion. Mediastinum/Nodes: Mediastinal lymph nodes are not enlarged by CT size criteria. No hilar or axillary adenopathy. 11 mm prepericardiac lymph node (2/41), stable. Esophagus is grossly unremarkable. Lungs/Pleura: Minimal scarring in the lingula. Lungs are otherwise clear. No pleural fluid. Airway is unremarkable. Musculoskeletal: No worrisome lytic or sclerotic lesions. CT ABDOMEN PELVIS FINDINGS Hepatobiliary: Heterogeneous nodular left hepatic lobe mass is grossly similar in size, 11.8 x 13.0 cm, compared to 11.5 x 12.5 cm on 05/10/2021. However, other lesions throughout the liver appear increasingly necrotic and some have increased in size. Index lesion in the posterior right hepatic lobe measures 3.5 x 4.0 cm (2/61), previously 2.9 x 3.1 cm on 05/10/2021. A second index lesion in the dome of the liver measures approximately 3.8 x 6.3 cm (2/49), previously 3.6 x 4.9 cm. It does appear contiguous with other low-density masses in the adjacent dome. Liver is enlarged, 22.8 cm. Gallbladder is unremarkable. Small perihepatic ascites. No biliary ductal dilatation. Pancreas: Negative. Spleen: Enlarged, 17.4 cm. Adrenals/Urinary Tract: Adrenal glands and kidneys are unremarkable. Ureters are decompressed. Bladder is  thick-walled. Stomach/Bowel: Stomach, small bowel, appendix and colon are unremarkable. Vascular/Lymphatic: Gastroesophageal varices. Left portal vein is poorly visualized and possibly thrombosed, as before. Periportal adenopathy measures up to  3.4 cm (2/60), previously 3.1 cm. Abdominal retroperitoneal lymph nodes measure up to 10 mm (2/72) in the left periaortic station, similar. Reproductive: Prostate is normal in size. Other: Small perihepatic ascites. Mesenteries and peritoneum are otherwise unremarkable. Musculoskeletal: Degenerative changes in the spine and hips. IMPRESSION: 1. Interval progression of metastatic cholangiocarcinoma involving the liver and periportal lymph nodes. Prepericardiac lymph node is stable. 2. As before, left portal vein is poorly visualized and may be thrombosed. 3. Hepatosplenomegaly. 4. Small perihepatic ascites. 5. Bladder wall thickening. 6. Aortic atherosclerosis (ICD10-I70.0). Coronary artery calcification. Electronically Signed   By: Lorin Picket M.D.   On: 07/13/2021 14:23    Medications: I have reviewed the patient's current medications.   Assessment/Plan: Cholangiocarcinoma  multiple liver masses and abdominal lymphadenopathy CTs 01/13/2020-rounded hypodense mass appears to arise from the pancreas neck, multiple rim-enhancing masses in the liver, primarily left liver with segmental dilation of the left lobe Intermatic bile ducts, ill-defined hypodensity the central liver with effacement of the left portal vein, enlarged portacaval and retroperitoneal lymph nodes Ultrasound-guided biopsy of the left liver lesion 01/19/2020-adenocarcinoma, cytokeratin 7+, MSS, tumor mutation burden 1, IDH1 R132C MRI abdomen 01/31/2020-poorly marginated central liver mass, multiple smaller similar satellite liver masses, extrinsic mass-effect at the biliary hilum with intrahepatic biliary ductal dilatation throughout the left liver with mild Intermatic dilatation the superior right liver, no pancreas mass or ductal dilatation, normal spleen size, numerous enlarged enhancing lymph nodes at the porta hepatis, peripancreatic, portacaval, aortocaval, left periaortic chains Cycle 1 gemcitabine/cisplatin 02/04/2020 Cycle 2  gemcitabine/cisplatin 02/28/2020 Cycle 3 gemcitabine/cisplatin 03/20/2020 CT abdomen/pelvis 03/31/2020-mild decrease in central liver tumor, mild decrease in size of liver metastases and upper abdominal adenopathy, new splenomegaly Cycle 4 gemcitabine/cisplatin 04/10/2020 Cycle 5 gemcitabine/cisplatin 05/01/2020 Cycle 6 gemcitabine/cisplatin plus durvalumab 05/22/2020 CTs 06/06/2020- dominant central liver mass mildly enlarged, other liver lesions and abdominal adenopathy is stable, no evidence of metastatic disease to the chest Cycle 7 gemcitabine/cisplatin plus Durvalumab 06/12/2020 Cycle 8 gemcitabine/cisplatin plus Durvalumab 06/30/2020 Cycle 9 gemcitabine/cisplatin plus Durvalumab 07/31/2020 CT abdomen/pelvis 08/20/2020-decreased size of dominant central liver mass, slight increase in size and stable additional liver lesions, stable portacaval node Cycle 10 gemcitabine/cisplatin plus Durvalumab 08/21/2020 Cycle 11 gemcitabine/cisplatin plus Durvalumab 09/11/2020 Cycle 12 gemcitabine/cisplatin plus Durvalumab 10/02/2020 Cycle 13 gemcitabine/cisplatin plus Durvalumab 10/23/2020 CT abdomen/pelvis 11/10/2020-no change in dominant central liver mass and additional liver lesions, stable upper retroperitoneal adenopathy Cycle 14 gemcitabine/cisplatin plus Durvalumab 11/13/2020 Cycle 15 gemcitabine/cisplatin plus Durvalumab 12/04/2020 Cycle 15 gemcitabine/cisplatin plus Durvalumab 01/08/2021 Cycle 16 gemcitabine/cisplatin plus Durvalumab 01/30/2021-cisplatin held from day 1 and day 8 secondary to neuropathy CTs 02/15/2021-slight increase in size of central liver mass, progressive metastatic lesions in the right liver, stable to slightly decreased periportal lymph nodes, progression of splenomegaly, chronically thrombosed left portal vein Cycle 1 FOLFOX 03/05/2021 Cycle 2 FOLFOX 03/19/2021, oxaliplatin dose reduced due to thrombocytopenia Cycle 3 FOLFOX 04/02/2021 Cycle 4 FOLFOX 04/16/2021 Cycle 5 FOLFOX 04/30/2021 CTs  05/10/2021-new and enlarging liver metastases, chronic left lobe biliary dilatation, chronic left portal vein thrombosis, stable porta hepatis adenopathy, splenomegaly, no evidence of metastatic disease to the chest Ivosidenib 05/21/2021 CT 07/13/2021-increase in size of periportal node, stable retroperitoneal nodes, dominant left hepatic mass-stable, increased necrosis of other liver lesions, some have increased in size Ivosidenib continued   Cough-likely related to diaphragmatic irritation from #1 Anorexia/weight loss Hypercalcemia-likely  hypercalcemia malignancy, status post intravenous hydration and Zometa 01/14/2020 Diabetes Hyperlipidemia Hypertension History of peripheral neuropathy secondary to diabetes Severe back pain following Fulphila-oxycodone prescribed Thrombocytopenia secondary to chemotherapy-gemcitabine held with day 1 cycle 8 and then dose reduced COVID-19 infection 07/22/2020, 12/25/2020 Cisplatin induced peripheral neuropathy   Disposition: Mr. Crisco appears stable.  The cough has improved compared to when we saw him last week.  I reviewed the restaging CT findings and images with Mr. Lucia Gaskins and his wife.  We measured multiple liver lesions.  There appears to be less enhancement associated with the lesions and no significant change in size.  I will present his case at the GI tumor conference for further review. He will continue ivosidenib for now.  We will refer him back to [facility] for another opinion and to consider future salvage treatment options.  He will return for an office and lab visit on 08/06/2020.  Betsy Coder, MD  07/16/2021  8:59 AM

## 2021-07-18 NOTE — Telephone Encounter (Signed)
Oral Oncology Patient Advocate Encounter   Was successful in obtaining a copay card for [employer].  This copay card will make the patients copay $25.  I have spoken with the patients wife, [name].  The billing information is as follows and has been shared with [place].   RxBin: [id number] Member ID: [id number] Group ID: [id number]  [place] Patient [place] Phone [phone] Fax [phone]

## 2021-07-19 ENCOUNTER — Telehealth: Payer: Self-pay

## 2021-07-19 ENCOUNTER — Other Ambulatory Visit (HOSPITAL_BASED_OUTPATIENT_CLINIC_OR_DEPARTMENT_OTHER): Payer: Self-pay

## 2021-07-19 NOTE — Telephone Encounter (Signed)
Records request received from Wilford Sports, RN, patient's Case Manager through [employer].  Requested records faxed on 07/18/21.

## 2021-07-20 ENCOUNTER — Other Ambulatory Visit (HOSPITAL_BASED_OUTPATIENT_CLINIC_OR_DEPARTMENT_OTHER): Payer: Self-pay

## 2021-07-25 ENCOUNTER — Other Ambulatory Visit: Payer: Self-pay

## 2021-07-25 NOTE — Progress Notes (Signed)
The proposed treatment discussed in conference is for discussion purpose only and is not a binding recommendation.  The patients have not been physically examined, or presented with their treatment options.  Therefore, final treatment plans cannot be decided.  

## 2021-07-27 ENCOUNTER — Other Ambulatory Visit (HOSPITAL_BASED_OUTPATIENT_CLINIC_OR_DEPARTMENT_OTHER): Payer: Self-pay

## 2021-08-01 ENCOUNTER — Encounter: Payer: Self-pay | Admitting: Family Medicine

## 2021-08-01 ENCOUNTER — Encounter: Payer: Self-pay | Admitting: Oncology

## 2021-08-02 ENCOUNTER — Other Ambulatory Visit (HOSPITAL_COMMUNITY): Payer: Self-pay

## 2021-08-02 ENCOUNTER — Other Ambulatory Visit: Payer: Self-pay | Admitting: Family Medicine

## 2021-08-02 DIAGNOSIS — C259 Malignant neoplasm of pancreas, unspecified: Secondary | ICD-10-CM

## 2021-08-06 ENCOUNTER — Inpatient Hospital Stay: Payer: No Typology Code available for payment source | Attending: Oncology

## 2021-08-06 ENCOUNTER — Inpatient Hospital Stay (HOSPITAL_BASED_OUTPATIENT_CLINIC_OR_DEPARTMENT_OTHER): Payer: No Typology Code available for payment source | Admitting: Oncology

## 2021-08-06 ENCOUNTER — Other Ambulatory Visit (HOSPITAL_COMMUNITY): Payer: Self-pay

## 2021-08-06 ENCOUNTER — Other Ambulatory Visit (HOSPITAL_BASED_OUTPATIENT_CLINIC_OR_DEPARTMENT_OTHER): Payer: Self-pay

## 2021-08-06 ENCOUNTER — Inpatient Hospital Stay: Payer: No Typology Code available for payment source

## 2021-08-06 VITALS — BP 141/90 | HR 65 | Temp 98.1°F | Resp 18 | Ht 73.0 in | Wt 239.0 lb

## 2021-08-06 DIAGNOSIS — E785 Hyperlipidemia, unspecified: Secondary | ICD-10-CM | POA: Insufficient documentation

## 2021-08-06 DIAGNOSIS — C221 Intrahepatic bile duct carcinoma: Secondary | ICD-10-CM | POA: Insufficient documentation

## 2021-08-06 DIAGNOSIS — Z95828 Presence of other vascular implants and grafts: Secondary | ICD-10-CM

## 2021-08-06 DIAGNOSIS — R059 Cough, unspecified: Secondary | ICD-10-CM | POA: Diagnosis not present

## 2021-08-06 DIAGNOSIS — I10 Essential (primary) hypertension: Secondary | ICD-10-CM | POA: Diagnosis not present

## 2021-08-06 DIAGNOSIS — E1142 Type 2 diabetes mellitus with diabetic polyneuropathy: Secondary | ICD-10-CM | POA: Insufficient documentation

## 2021-08-06 DIAGNOSIS — R630 Anorexia: Secondary | ICD-10-CM | POA: Diagnosis not present

## 2021-08-06 DIAGNOSIS — D6959 Other secondary thrombocytopenia: Secondary | ICD-10-CM | POA: Diagnosis not present

## 2021-08-06 DIAGNOSIS — I81 Portal vein thrombosis: Secondary | ICD-10-CM | POA: Insufficient documentation

## 2021-08-06 DIAGNOSIS — R634 Abnormal weight loss: Secondary | ICD-10-CM | POA: Insufficient documentation

## 2021-08-06 LAB — CBC WITH DIFFERENTIAL (CANCER CENTER ONLY)
Abs Immature Granulocytes: 0.01 10*3/uL (ref 0.00–0.07)
Basophils Absolute: 0.1 10*3/uL (ref 0.0–0.1)
Basophils Relative: 1 %
Eosinophils Absolute: 0.3 10*3/uL (ref 0.0–0.5)
Eosinophils Relative: 7 %
HCT: 37.9 % — ABNORMAL LOW (ref 39.0–52.0)
Hemoglobin: 12.3 g/dL — ABNORMAL LOW (ref 13.0–17.0)
Immature Granulocytes: 0 %
Lymphocytes Relative: 23 %
Lymphs Abs: 1.1 10*3/uL (ref 0.7–4.0)
MCH: 32 pg (ref 26.0–34.0)
MCHC: 32.5 g/dL (ref 30.0–36.0)
MCV: 98.7 fL (ref 80.0–100.0)
Monocytes Absolute: 0.5 10*3/uL (ref 0.1–1.0)
Monocytes Relative: 11 %
Neutro Abs: 2.8 10*3/uL (ref 1.7–7.7)
Neutrophils Relative %: 58 %
Platelet Count: 131 10*3/uL — ABNORMAL LOW (ref 150–400)
RBC: 3.84 MIL/uL — ABNORMAL LOW (ref 4.22–5.81)
RDW: 13.9 % (ref 11.5–15.5)
WBC Count: 4.8 10*3/uL (ref 4.0–10.5)
nRBC: 0 % (ref 0.0–0.2)

## 2021-08-06 LAB — CMP (CANCER CENTER ONLY)
ALT: 16 U/L (ref 0–44)
AST: 59 U/L — ABNORMAL HIGH (ref 15–41)
Albumin: 3.4 g/dL — ABNORMAL LOW (ref 3.5–5.0)
Alkaline Phosphatase: 141 U/L — ABNORMAL HIGH (ref 38–126)
Anion gap: 9 (ref 5–15)
BUN: 14 mg/dL (ref 6–20)
CO2: 27 mmol/L (ref 22–32)
Calcium: 10 mg/dL (ref 8.9–10.3)
Chloride: 103 mmol/L (ref 98–111)
Creatinine: 0.77 mg/dL (ref 0.61–1.24)
GFR, Estimated: >60 mL/min (ref >60)
Glucose, Bld: 123 mg/dL — ABNORMAL HIGH (ref 70–99)
Potassium: 4 mmol/L (ref 3.5–5.1)
Sodium: 139 mmol/L (ref 135–145)
Total Bilirubin: 1.2 mg/dL (ref 0.3–1.2)
Total Protein: 7.5 g/dL (ref 6.5–8.1)

## 2021-08-06 LAB — MAGNESIUM: Magnesium: 2.1 mg/dL (ref 1.7–2.4)

## 2021-08-06 LAB — PHOSPHORUS: Phosphorus: 3 mg/dL (ref 2.5–4.6)

## 2021-08-06 MED ORDER — HYDROCOD POLI-CHLORPHE POLI ER 10-8 MG/5ML PO SUER
5.0000 mL | Freq: Two times a day (BID) | ORAL | 0 refills | Status: DC | PRN
  Filled 2021-08-06: qty 300, 30d supply, fill #0

## 2021-08-06 MED ORDER — HEPARIN SOD (PORK) LOCK FLUSH 100 UNIT/ML IV SOLN
500.0000 [IU] | Freq: Once | INTRAVENOUS | Status: AC
  Administered 2021-08-06: 500 [IU] via INTRAVENOUS

## 2021-08-06 MED ORDER — SODIUM CHLORIDE 0.9% FLUSH
10.0000 mL | INTRAVENOUS | Status: DC | PRN
  Administered 2021-08-06: 10 mL via INTRAVENOUS

## 2021-08-06 NOTE — Progress Notes (Signed)
[place] OFFICE PROGRESS NOTE   Diagnosis: Cholangiocarcinoma  INTERVAL HISTORY:   Mr. Downs returns as scheduled.  He continues ivosidenib.  The cough has increased.  He reports malaise.  No other complaint.  Objective:  Vital signs in last 24 hours:  Blood pressure (!) 141/90, pulse 65, temperature 98.1 F (36.7 C), temperature source Oral, resp. rate 18, height 6' 1"  (1.854 m), weight 239 lb (108.4 kg), SpO2 98 %.    Resp: Lungs clear bilaterally Cardio: Regular rate and rhythm GI: The liver edge is palpable in the lateral right subcostal region, no splenomegaly Vascular: No leg edema    Portacath/PICC-without erythema  Lab Results:  Lab Results  Component Value Date   WBC 4.8 08/06/2021   HGB 12.3 (L) 08/06/2021   HCT 37.9 (L) 08/06/2021   MCV 98.7 08/06/2021   PLT 131 (L) 08/06/2021   NEUTROABS 2.8 08/06/2021    CMP  Lab Results  Component Value Date   NA 137 07/16/2021   K 3.7 07/16/2021   CL 102 07/16/2021   CO2 27 07/16/2021   GLUCOSE 124 (H) 07/16/2021   BUN 13 07/16/2021   CREATININE 0.81 07/16/2021   CALCIUM 10.1 07/16/2021   PROT 7.4 07/16/2021   ALBUMIN 3.3 (L) 07/16/2021   AST 65 (H) 07/16/2021   ALT 18 07/16/2021   ALKPHOS 149 (H) 07/16/2021   BILITOT 1.4 (H) 07/16/2021   GFRNONAA >60 07/16/2021   GFRAA 127 01/11/2020    Medications: I have reviewed the patient's current medications.   Assessment/Plan:  Cholangiocarcinoma  multiple liver masses and abdominal lymphadenopathy CTs 01/13/2020-rounded hypodense mass appears to arise from the pancreas neck, multiple rim-enhancing masses in the liver, primarily left liver with segmental dilation of the left lobe Intermatic bile ducts, ill-defined hypodensity the central liver with effacement of the left portal vein, enlarged portacaval and retroperitoneal lymph nodes Ultrasound-guided biopsy of the left liver lesion 01/19/2020-adenocarcinoma, cytokeratin 7+, MSS, tumor  mutation burden 1, IDH1 R132C MRI abdomen 01/31/2020-poorly marginated central liver mass, multiple smaller similar satellite liver masses, extrinsic mass-effect at the biliary hilum with intrahepatic biliary ductal dilatation throughout the left liver with mild Intermatic dilatation the superior right liver, no pancreas mass or ductal dilatation, normal spleen size, numerous enlarged enhancing lymph nodes at the porta hepatis, peripancreatic, portacaval, aortocaval, left periaortic chains Cycle 1 gemcitabine/cisplatin 02/04/2020 Cycle 2 gemcitabine/cisplatin 02/28/2020 Cycle 3 gemcitabine/cisplatin 03/20/2020 CT abdomen/pelvis 03/31/2020-mild decrease in central liver tumor, mild decrease in size of liver metastases and upper abdominal adenopathy, new splenomegaly Cycle 4 gemcitabine/cisplatin 04/10/2020 Cycle 5 gemcitabine/cisplatin 05/01/2020 Cycle 6 gemcitabine/cisplatin plus durvalumab 05/22/2020 CTs 06/06/2020- dominant central liver mass mildly enlarged, other liver lesions and abdominal adenopathy is stable, no evidence of metastatic disease to the chest Cycle 7 gemcitabine/cisplatin plus Durvalumab 06/12/2020 Cycle 8 gemcitabine/cisplatin plus Durvalumab 06/30/2020 Cycle 9 gemcitabine/cisplatin plus Durvalumab 07/31/2020 CT abdomen/pelvis 08/20/2020-decreased size of dominant central liver mass, slight increase in size and stable additional liver lesions, stable portacaval node Cycle 10 gemcitabine/cisplatin plus Durvalumab 08/21/2020 Cycle 11 gemcitabine/cisplatin plus Durvalumab 09/11/2020 Cycle 12 gemcitabine/cisplatin plus Durvalumab 10/02/2020 Cycle 13 gemcitabine/cisplatin plus Durvalumab 10/23/2020 CT abdomen/pelvis 11/10/2020-no change in dominant central liver mass and additional liver lesions, stable upper retroperitoneal adenopathy Cycle 14 gemcitabine/cisplatin plus Durvalumab 11/13/2020 Cycle 15 gemcitabine/cisplatin plus Durvalumab 12/04/2020 Cycle 15 gemcitabine/cisplatin plus Durvalumab  01/08/2021 Cycle 16 gemcitabine/cisplatin plus Durvalumab 01/30/2021-cisplatin held from day 1 and day 8 secondary to neuropathy CTs 02/15/2021-slight increase in size of central liver mass, progressive metastatic lesions in  the right liver, stable to slightly decreased periportal lymph nodes, progression of splenomegaly, chronically thrombosed left portal vein Cycle 1 FOLFOX 03/05/2021 Cycle 2 FOLFOX 03/19/2021, oxaliplatin dose reduced due to thrombocytopenia Cycle 3 FOLFOX 04/02/2021 Cycle 4 FOLFOX 04/16/2021 Cycle 5 FOLFOX 04/30/2021 CTs 05/10/2021-new and enlarging liver metastases, chronic left lobe biliary dilatation, chronic left portal vein thrombosis, stable porta hepatis adenopathy, splenomegaly, no evidence of metastatic disease to the chest Ivosidenib 05/21/2021 CT 07/13/2021-increase in size of periportal node, stable retroperitoneal nodes, dominant left hepatic mass-stable, increased necrosis of other liver lesions, some have increased in size Ivosidenib continued   Cough-likely related to diaphragmatic irritation from #1 Anorexia/weight loss Hypercalcemia-likely hypercalcemia malignancy, status post intravenous hydration and Zometa 01/14/2020 Diabetes Hyperlipidemia Hypertension History of peripheral neuropathy secondary to diabetes Severe back pain following Fulphila-oxycodone prescribed Thrombocytopenia secondary to chemotherapy-gemcitabine held with day 1 cycle 8 and then dose reduced COVID-19 infection 07/22/2020, 12/25/2020 Cisplatin induced peripheral neuropathy    Disposition: Mr. Reine has an increased cough.  He otherwise appears unchanged.  I suspect the cough is related to the liver tumor burden.  He will try Tussionex.  He continues ivosidenib.  He is scheduled to see Dr. Fanny Skates on 08/16/2021.  We discussed the possibility of hepatic directed therapy.  He will return for an office visit here on 08/17/2021.  Betsy Coder, MD  08/06/2021  8:43 AM

## 2021-08-08 ENCOUNTER — Encounter: Payer: Self-pay | Admitting: *Deleted

## 2021-08-08 NOTE — Progress Notes (Signed)
Faxed 08/06/21 office note and current med list as requested to [facility] #[phone] att: Wilford Sports, LPN

## 2021-08-17 ENCOUNTER — Inpatient Hospital Stay: Payer: No Typology Code available for payment source | Admitting: Oncology

## 2021-08-20 ENCOUNTER — Inpatient Hospital Stay (HOSPITAL_BASED_OUTPATIENT_CLINIC_OR_DEPARTMENT_OTHER): Payer: No Typology Code available for payment source | Admitting: Oncology

## 2021-08-20 ENCOUNTER — Inpatient Hospital Stay: Payer: No Typology Code available for payment source

## 2021-08-20 ENCOUNTER — Other Ambulatory Visit (HOSPITAL_BASED_OUTPATIENT_CLINIC_OR_DEPARTMENT_OTHER): Payer: Self-pay

## 2021-08-20 ENCOUNTER — Other Ambulatory Visit: Payer: Self-pay

## 2021-08-20 ENCOUNTER — Other Ambulatory Visit: Payer: Self-pay | Admitting: Family Medicine

## 2021-08-20 VITALS — BP 118/75 | HR 63 | Temp 98.1°F | Resp 18 | Ht 73.0 in | Wt 233.0 lb

## 2021-08-20 DIAGNOSIS — C221 Intrahepatic bile duct carcinoma: Secondary | ICD-10-CM

## 2021-08-20 LAB — CMP (CANCER CENTER ONLY)
ALT: 28 U/L (ref 0–44)
AST: 161 U/L — ABNORMAL HIGH (ref 15–41)
Albumin: 3.3 g/dL — ABNORMAL LOW (ref 3.5–5.0)
Alkaline Phosphatase: 143 U/L — ABNORMAL HIGH (ref 38–126)
Anion gap: 5 (ref 5–15)
BUN: 12 mg/dL (ref 6–20)
CO2: 28 mmol/L (ref 22–32)
Calcium: 10 mg/dL (ref 8.9–10.3)
Chloride: 92 mmol/L — ABNORMAL LOW (ref 98–111)
Creatinine: 0.96 mg/dL (ref 0.61–1.24)
GFR, Estimated: >60 mL/min (ref >60)
Glucose, Bld: 108 mg/dL — ABNORMAL HIGH (ref 70–99)
Potassium: 3.8 mmol/L (ref 3.5–5.1)
Sodium: 125 mmol/L — ABNORMAL LOW (ref 135–145)
Total Bilirubin: 1.9 mg/dL — ABNORMAL HIGH (ref 0.3–1.2)
Total Protein: 7.3 g/dL (ref 6.5–8.1)

## 2021-08-20 LAB — CBC WITH DIFFERENTIAL (CANCER CENTER ONLY)
Abs Immature Granulocytes: 0.01 10*3/uL (ref 0.00–0.07)
Basophils Absolute: 0 10*3/uL (ref 0.0–0.1)
Basophils Relative: 1 %
Eosinophils Absolute: 0.2 10*3/uL (ref 0.0–0.5)
Eosinophils Relative: 5 %
HCT: 39.6 % (ref 39.0–52.0)
Hemoglobin: 13.5 g/dL (ref 13.0–17.0)
Immature Granulocytes: 0 %
Lymphocytes Relative: 29 %
Lymphs Abs: 1.3 10*3/uL (ref 0.7–4.0)
MCH: 32.6 pg (ref 26.0–34.0)
MCHC: 34.1 g/dL (ref 30.0–36.0)
MCV: 95.7 fL (ref 80.0–100.0)
Monocytes Absolute: 0.8 10*3/uL (ref 0.1–1.0)
Monocytes Relative: 18 %
Neutro Abs: 2.1 10*3/uL (ref 1.7–7.7)
Neutrophils Relative %: 47 %
Platelet Count: 100 10*3/uL — ABNORMAL LOW (ref 150–400)
RBC: 4.14 MIL/uL — ABNORMAL LOW (ref 4.22–5.81)
RDW: 13.9 % (ref 11.5–15.5)
WBC Count: 4.4 10*3/uL (ref 4.0–10.5)
nRBC: 0 % (ref 0.0–0.2)

## 2021-08-20 LAB — MAGNESIUM: Magnesium: 2.1 mg/dL (ref 1.7–2.4)

## 2021-08-20 MED ORDER — HYDROCOD POLI-CHLORPHE POLI ER 10-8 MG/5ML PO SUER
5.0000 mL | Freq: Two times a day (BID) | ORAL | 0 refills | Status: DC | PRN
  Filled 2021-08-20 – 2021-09-07 (×2): qty 300, 30d supply, fill #0

## 2021-08-20 MED ORDER — ALPRAZOLAM 0.5 MG PO TABS
0.5000 mg | ORAL_TABLET | Freq: Three times a day (TID) | ORAL | 3 refills | Status: DC | PRN
  Filled 2021-08-20: qty 60, 20d supply, fill #0
  Filled 2021-09-07: qty 60, 20d supply, fill #1
  Filled 2021-10-09: qty 60, 20d supply, fill #2

## 2021-08-20 MED ORDER — TRAMADOL HCL 50 MG PO TABS
ORAL_TABLET | ORAL | 5 refills | Status: DC
  Filled 2021-08-20: qty 60, 15d supply, fill #0
  Filled 2021-09-07: qty 60, 15d supply, fill #1
  Filled 2021-10-09: qty 60, 15d supply, fill #2
  Filled 2021-11-05: qty 60, 15d supply, fill #3

## 2021-08-20 MED ORDER — DEXCOM G6 SENSOR MISC
3 refills | Status: AC
  Filled 2021-08-20: qty 9, 90d supply, fill #0
  Filled 2021-11-05: qty 9, 90d supply, fill #1

## 2021-08-20 NOTE — Progress Notes (Signed)
[name]   Diagnosis: Cholangiocarcinoma  INTERVAL HISTORY:   Matthew Osborne returns as scheduled.  He has an increased cough.  He is somnolent when taking Tussionex and tramadol for the cough.  His appetite has diminished.  He continues ivosedenib. He saw Dr. Fanny Skates on 08/16/2021.  She recommends salvage treatment with FOLFIRI. Matthew Osborne is here today with his wife. Objective:  Vital signs in last 24 hours:  Blood pressure 118/75, pulse 63, temperature 98.1 F (36.7 C), temperature source Oral, resp. rate 18, height $RemoveBe'6\' 1"'JXuyyUtUq$  (1.854 m), weight 233 lb (105.7 kg), SpO2 98 %.    HEENT: Sclera anicteric Resp: Lungs clear bilaterally Cardio: Regular rate and rhythm GI: Nontender, no hepatosplenomegaly Vascular: No leg edema    Portacath/PICC-without erythema  Lab Results:  Lab Results  Component Value Date   WBC 4.4 08/20/2021   HGB 13.5 08/20/2021   HCT 39.6 08/20/2021   MCV 95.7 08/20/2021   PLT 100 (L) 08/20/2021   NEUTROABS 2.1 08/20/2021    CMP  Lab Results  Component Value Date   NA 125 (L) 08/20/2021   K 3.8 08/20/2021   CL 92 (L) 08/20/2021   CO2 28 08/20/2021   GLUCOSE 108 (H) 08/20/2021   BUN 12 08/20/2021   CREATININE 0.96 08/20/2021   CALCIUM 10.0 08/20/2021   PROT 7.3 08/20/2021   ALBUMIN 3.3 (L) 08/20/2021   AST 161 (H) 08/20/2021   ALT 28 08/20/2021   ALKPHOS 143 (H) 08/20/2021   BILITOT 1.9 (H) 08/20/2021   GFRNONAA >60 08/20/2021   GFRAA 127 01/11/2020    Lab Results  Component Value Date   CEA1 2.15 01/14/2020   UEA540 123 (H) 07/03/2021   Medications: I have reviewed the patient's current medications.   Assessment/Plan: Cholangiocarcinoma  multiple liver masses and abdominal lymphadenopathy CTs 01/13/2020-rounded hypodense mass appears to arise from the pancreas neck, multiple rim-enhancing masses in the liver, primarily left liver with segmental dilation of the left lobe Intermatic bile ducts,  ill-defined hypodensity the central liver with effacement of the left portal vein, enlarged portacaval and retroperitoneal lymph nodes Ultrasound-guided biopsy of the left liver lesion 01/19/2020-adenocarcinoma, cytokeratin 7+, MSS, tumor mutation burden 1, IDH1 R132C MRI abdomen 01/31/2020-poorly marginated central liver mass, multiple smaller similar satellite liver masses, extrinsic mass-effect at the biliary hilum with intrahepatic biliary ductal dilatation throughout the left liver with mild Intermatic dilatation the superior right liver, no pancreas mass or ductal dilatation, normal spleen size, numerous enlarged enhancing lymph nodes at the porta hepatis, peripancreatic, portacaval, aortocaval, left periaortic chains Cycle 1 gemcitabine/cisplatin 02/04/2020 Cycle 2 gemcitabine/cisplatin 02/28/2020 Cycle 3 gemcitabine/cisplatin 03/20/2020 CT abdomen/pelvis 03/31/2020-mild decrease in central liver tumor, mild decrease in size of liver metastases and upper abdominal adenopathy, new splenomegaly Cycle 4 gemcitabine/cisplatin 04/10/2020 Cycle 5 gemcitabine/cisplatin 05/01/2020 Cycle 6 gemcitabine/cisplatin plus durvalumab 05/22/2020 CTs 06/06/2020- dominant central liver mass mildly enlarged, other liver lesions and abdominal adenopathy is stable, no evidence of metastatic disease to the chest Cycle 7 gemcitabine/cisplatin plus Durvalumab 06/12/2020 Cycle 8 gemcitabine/cisplatin plus Durvalumab 06/30/2020 Cycle 9 gemcitabine/cisplatin plus Durvalumab 07/31/2020 CT abdomen/pelvis 08/20/2020-decreased size of dominant central liver mass, slight increase in size and stable additional liver lesions, stable portacaval node Cycle 10 gemcitabine/cisplatin plus Durvalumab 08/21/2020 Cycle 11 gemcitabine/cisplatin plus Durvalumab 09/11/2020 Cycle 12 gemcitabine/cisplatin plus Durvalumab 10/02/2020 Cycle 13 gemcitabine/cisplatin plus Durvalumab 10/23/2020 CT abdomen/pelvis 11/10/2020-no change in dominant central liver mass  and additional liver lesions, stable upper retroperitoneal adenopathy Cycle 14 gemcitabine/cisplatin plus Durvalumab 11/13/2020 Cycle  15 gemcitabine/cisplatin plus Durvalumab 12/04/2020 Cycle 15 gemcitabine/cisplatin plus Durvalumab 01/08/2021 Cycle 16 gemcitabine/cisplatin plus Durvalumab 01/30/2021-cisplatin held from day 1 and day 8 secondary to neuropathy CTs 02/15/2021-slight increase in size of central liver mass, progressive metastatic lesions in the right liver, stable to slightly decreased periportal lymph nodes, progression of splenomegaly, chronically thrombosed left portal vein Cycle 1 FOLFOX 03/05/2021 Cycle 2 FOLFOX 03/19/2021, oxaliplatin dose reduced due to thrombocytopenia Cycle 3 FOLFOX 04/02/2021 Cycle 4 FOLFOX 04/16/2021 Cycle 5 FOLFOX 04/30/2021 CTs 05/10/2021-new and enlarging liver metastases, chronic left lobe biliary dilatation, chronic left portal vein thrombosis, stable porta hepatis adenopathy, splenomegaly, no evidence of metastatic disease to the chest Ivosidenib 05/21/2021 CT 07/13/2021-increase in size of periportal node, stable retroperitoneal nodes, dominant left hepatic mass-stable, increased necrosis of other liver lesions, some have increased in size Ivosidenib continued   Cough-likely related to diaphragmatic irritation from #1 Anorexia/weight loss Hypercalcemia-likely hypercalcemia malignancy, status post intravenous hydration and Zometa 01/14/2020 Diabetes Hyperlipidemia Hypertension History of peripheral neuropathy secondary to diabetes Severe back pain following Fulphila-oxycodone prescribed Thrombocytopenia secondary to chemotherapy-gemcitabine held with day 1 cycle 8 and then dose reduced COVID-19 infection 07/22/2020, 12/25/2020 Cisplatin induced peripheral neuropathy      Disposition: [name] has metastatic intrahepatic cholangiocarcinoma.  The increased cough is likely related to disease progression.  He continues Ivosidenib.  We discussed  discontinuing Ivosidenib and beginning salvage treatment with FOLFIRI.  I reviewed potential toxicities associated with the FOLFIRI regimen including the chance of nausea/vomiting, hematologic toxicity, alopecia, and diarrhea.  He would like to continue Ivosedinib for now.  He will be scheduled for a restaging CT in approximately 2 weeks.  The plan is to change to FOLFIRI if the CT confirmed disease progression.  The QT interval is longer today.  We will monitor the EKG and hold the Ivosedinib for a QTc of greater than 480  Betsy Coder, MD  08/20/2021  1:13 PM

## 2021-08-21 ENCOUNTER — Encounter: Payer: Self-pay | Admitting: Oncology

## 2021-08-21 ENCOUNTER — Other Ambulatory Visit: Payer: Self-pay | Admitting: *Deleted

## 2021-08-21 DIAGNOSIS — C221 Intrahepatic bile duct carcinoma: Secondary | ICD-10-CM

## 2021-08-22 ENCOUNTER — Encounter: Payer: Self-pay | Admitting: Oncology

## 2021-08-23 ENCOUNTER — Other Ambulatory Visit: Payer: Self-pay

## 2021-08-23 DIAGNOSIS — C221 Intrahepatic bile duct carcinoma: Secondary | ICD-10-CM

## 2021-08-24 ENCOUNTER — Telehealth: Payer: Self-pay

## 2021-08-24 ENCOUNTER — Inpatient Hospital Stay: Payer: No Typology Code available for payment source

## 2021-08-24 DIAGNOSIS — C221 Intrahepatic bile duct carcinoma: Secondary | ICD-10-CM

## 2021-08-24 LAB — CMP (CANCER CENTER ONLY)
ALT: 26 U/L (ref 0–44)
AST: 100 U/L — ABNORMAL HIGH (ref 15–41)
Albumin: 3.2 g/dL — ABNORMAL LOW (ref 3.5–5.0)
Alkaline Phosphatase: 184 U/L — ABNORMAL HIGH (ref 38–126)
Anion gap: 7 (ref 5–15)
BUN: 10 mg/dL (ref 6–20)
CO2: 27 mmol/L (ref 22–32)
Calcium: 10.3 mg/dL (ref 8.9–10.3)
Chloride: 100 mmol/L (ref 98–111)
Creatinine: 0.92 mg/dL (ref 0.61–1.24)
GFR, Estimated: >60 mL/min (ref >60)
Glucose, Bld: 136 mg/dL — ABNORMAL HIGH (ref 70–99)
Potassium: 3.8 mmol/L (ref 3.5–5.1)
Sodium: 134 mmol/L — ABNORMAL LOW (ref 135–145)
Total Bilirubin: 1.6 mg/dL — ABNORMAL HIGH (ref 0.3–1.2)
Total Protein: 7.1 g/dL (ref 6.5–8.1)

## 2021-08-27 ENCOUNTER — Ambulatory Visit: Payer: No Typology Code available for payment source | Admitting: *Deleted

## 2021-08-27 DIAGNOSIS — E1165 Type 2 diabetes mellitus with hyperglycemia: Secondary | ICD-10-CM

## 2021-08-27 DIAGNOSIS — C221 Intrahepatic bile duct carcinoma: Secondary | ICD-10-CM

## 2021-08-27 DIAGNOSIS — C250 Malignant neoplasm of head of pancreas: Secondary | ICD-10-CM

## 2021-08-27 NOTE — Chronic Care Management (AMB) (Signed)
Care Management    RN Visit Note  08/27/2021 Name: Matthew Osborne MRN: [id number] DOB: 1975-02-07  Subjective: Matthew Osborne is a 46 y.o. year old male who is a primary care patient of Pickard, Matthew Mcgee, MD. The care management team was consulted for assistance with disease management and care coordination needs.    Engaged with patient by telephone for follow up visit in response to provider referral for case management and/or care coordination services.   Consent to Services:   Matthew Osborne was given information about Care Management services today including:  Care Management services includes personalized support from designated clinical staff supervised by his physician, including individualized plan of care and coordination with other care providers 24/7 contact phone numbers for assistance for urgent and routine care needs. The patient may stop case management services at any time by phone call to the office staff.  Patient agreed to services and consent obtained.   Assessment: Review of patient past medical history, allergies, medications, health status, including review of consultants reports, laboratory and other test data, was performed as part of comprehensive evaluation and provision of chronic care management services.   SDOH (Social Determinants of Health) assessments and interventions performed:    Care Plan  Allergies  Allergen Reactions   Niaspan [Niacin] Other (See Comments)    Flushing     Outpatient Encounter Medications as of 08/27/2021  Medication Sig   ALPRAZolam (XANAX) 0.5 MG tablet Take 1 tablet (0.5 mg total) by mouth 3 (three) times daily as needed.   bisoprolol-hydrochlorothiazide (ZIAC) 10-6.25 MG tablet TAKE 1 TABLET BY MOUTH EVERY DAY IN THE MORNING   blood glucose meter kit and supplies KIT Fasting prior to each meal three times a day   chlorpheniramine-HYDROcodone 10-8 MG/5ML Take 5 milliliters by mouth every 12 (twelve) hours as needed for cough.    Continuous Blood Gluc Sensor (DEXCOM G6 SENSOR) MISC Use as directed to check blood sugar 5-6 times daily.   Continuous Blood Gluc Transmit (DEXCOM G6 TRANSMITTER) MISC USE AS DIRECTED TO CHECK BLOOD SUGAR 5-6 TIMES DAILY *needs office visit*   Glucagon (BAQSIMI TWO PACK) 3 MG/DOSE POWD as directed Nasally Once a day PRN 30 days   glucose blood test strip Dispense based on patient and insurance preference.  Use twice daily as directed. (FOR ICD-10 E11.65)   Insulin Disposable Pump (OMNIPOD 5 G6 POD, GEN 5,) MISC Use as directed every 48 hours.   insulin lispro (HUMALOG) 100 UNIT/ML injection Inject 200 units/day under the skin via pump   Insulin Syringe 27G X 1/2" 0.5 ML MISC Use as directed to inject insulin SQ Q1D.   ivosidenib (TIBSOVO) 250 MG tablet Take 2 tablets (500 mg) by mouth daily.   Lancets Thin MISC Check BS BID   lidocaine-prilocaine (EMLA) cream APPLY 1 APPLICATION TOPICALLY AS DIRECTED. APPLY TO PORT SITE 1-2 HOURS PRIOR TO STICK AND COVER WITH PLASTIC WRAP.   LORazepam (ATIVAN) 0.5 MG tablet Take 1 tablet (0.5 mg total) by mouth at bedtime as needed for anxiety. (Patient taking differently: Take 0.5 mg by mouth at bedtime as needed (anxiety/agitation after chemo treatments).)   prochlorperazine (COMPAZINE) 10 MG tablet TAKE 1 TABLET BY MOUTH EVERY 6 HOURS AS NEEDED FOR NAUSEA   traMADol (ULTRAM) 50 MG tablet TAKE 1 TABLET BY MOUTH EVERY 6 HOURS AS NEEDED FOR MODERATE PAIN (COUGH).   TRUEPLUS LANCETS 33G MISC 1 each by Does not apply route 2 (two) times daily.   Facility-Administered Encounter  Medications as of 08/27/2021  Medication   sodium chloride flush (NS) 0.9 % injection 10 mL   sodium chloride flush (NS) 0.9 % injection 10 mL    Patient Active Problem List   Diagnosis Date Noted   COVID 12/25/2020   COVID-19 virus infection 12/25/2020   Genetic testing 03/24/2020   Family history of breast cancer    Family history of Hodgkin's lymphoma    Cholangiocarcinoma ([place])  02/01/2020   Goals of care, counseling/discussion 01/21/2020   Pancreas cancer ([place]) 01/21/2020   Hypercalcemia 01/14/2020   Left elbow pain 05/19/2019   Numbness 05/19/2019   Nonproliferative retinopathy due to secondary diabetes ([place])    Allergic rhinitis 06/17/2014   Sinus congestion 05/27/2014   Chronic cough 02/14/2014   Essential hypertension, benign 06/30/2013   Hyperlipidemia 06/30/2013   Diabetes ([place]) 06/30/2013    Conditions to be addressed/monitored: DMII and Cancer  Care Plan : RN Care Manager Plan of Care  Updates made by Matthew Mends, RN since 08/27/2021 12:00 AM  Completed 08/27/2021   Problem: No plan of care established for management of chronic disease states  (Diabetes, Cancer) Resolved 08/27/2021  Priority: High     Long-Range Goal: Development of plan of care for chronic disease management  (Diabetes, Cancer) Completed 08/27/2021  Start Date: 01/05/2021  Expected End Date: 11/28/2021  Priority: High  Note:   Current Barriers:  Knowledge Deficits related to plan of care for management of DMII and Cancer  - Spoke with patient and permission also given to speak with wife Matthew Osborne if needed.  Patient lives with spouse and children, reports is overall independent with ADL, IADL's, continues to drive,  undergoing chemotherapy po managed by Dr. Benay Osborne for cholangiocarcinoma/ pancreatic cancer.  Patient has dexcom for blood sugar monitoring with fasting readings low 100's range, random ranges 140-150's range. Pt denies any nausea or pain and states "appetite is good"  Had consult at [facility] oncology on 08/16/21 and will not continue follow up at [facility], continues to be followed by Dr. Benay Osborne. Patient reports he has a lingering cough and has medication to take for this.  Pt states he is taking one day at a time.  RNCM Clinical Goal(s):  Patient will verbalize understanding of plan for management of DMII and Cancer as evidenced by patient/ spouse report, review EHR and  through  collaboration with RN Care manager, provider, and care team.   Interventions: 1:1 collaboration with primary care provider regarding development and update of comprehensive plan of care as evidenced by provider attestation and co-signature Inter-disciplinary care team collaboration (see longitudinal plan of care) Evaluation of current treatment plan related to  self management and patient's adherence to plan as established by provider  Diabetes Interventions:  (Status:  New goal.) Long Term Goal Assessed patient's understanding of A1c goal: <7% Reviewed medications with patient and discussed importance of medication adherence Review of patient status, including review of consultants reports, relevant laboratory and other test results, and medications completed Reinforced carbohydrate modified diet Reviewed CBG log with patient Lab Results  Component Value Date   HGBA1C 7.6 (H) 12/25/2020  Oncology:  (Status: New goal.) Long Term Goal Assessment of understanding of oncology diagnosis:  Assessed patient understanding of cancer diagnosis and recommended treatment plan, Reviewed upcoming provider appointments and treatment appointments, Assessed available transportation to appointments and treatments. Has consistent/reliable transportation: Yes, and Assessed support system. Has consistent/reliable family or other support: Yes Reviewed importance of good handwashing, wearing a mask as needed and avoiding  sick people Reviewed reporting to oncologist any unwanted side effects such as nausea, vomiting, pain, etc Reinforced importance of getting adequate rest Pain assessment completed Reviewed plan of care with patient including case closure today (pt states he has adequate resources from oncologist and primary care provider)  Patient Goals/Self-Care Activities: Attend all scheduled provider appointments Call provider office for new concerns or questions  check blood sugar at prescribed times: per  Dexcom check feet daily for cuts, sores or redness enter blood sugar readings and medication or insulin into daily log take the blood sugar log to all doctor visits trim toenails straight across keep feet up while sitting wash and dry feet carefully every day wear comfortable, cotton socks wear comfortable, well-fitting shoes Report to oncologist for any unwanted/ unmanageable side effects such as nausea, vomiting or pain Try to stay well rested and keep stress to a minimum Avoid sick people Wash your hands well, wear a mask as needed to prevent infection Case closure today, if you have any future needs for case management, please let your doctor know          Plan: No further follow up required: case closure today  Jacqlyn Larsen [facility], BSN RN Case Manager [facility] [phone]

## 2021-08-27 NOTE — Patient Instructions (Signed)
Visit Information  Thank you for taking time to visit with me today. Please don't hesitate to contact me if I can be of assistance to you before our next scheduled telephone appointment.  Following are the goals we discussed today:  Attend all scheduled provider appointments Call provider office for new concerns or questions  check blood sugar at prescribed times: per Dexcom check feet daily for cuts, sores or redness enter blood sugar readings and medication or insulin into daily log take the blood sugar log to all doctor visits trim toenails straight across keep feet up while sitting wash and dry feet carefully every day wear comfortable, cotton socks wear comfortable, well-fitting shoes Report to oncologist for any unwanted/ unmanageable side effects such as nausea, vomiting or pain Try to stay well rested and keep stress to a minimum Avoid sick people Wash your hands well, wear a mask as needed to prevent infection Case closure today, if you have any future needs for case management, please let your doctor know   Please call the care guide team at [phone] if you need to cancel or reschedule your appointment.   If you are experiencing a Mental Health or [place] or need someone to talk to, please call the Suicide and Crisis Lifeline: 988 call the [place] National Suicide Prevention Lifeline: 925-697-4362 or TTY: (202)527-1186 TTY 364-736-9521) to talk to a trained counselor call 1-800-273-TALK (toll free, 24 hour hotline) go to [facility] [address] [place] [phone]) call 911   Patient verbalizes understanding of instructions and care plan provided today and agrees to view in [place]. Active MyChart status and patient understanding of how to access instructions and care plan via MyChart confirmed with patient.     No further follow up required: case closure today  Jacqlyn Larsen [facility], BSN RN Case Manager [facility] [phone]

## 2021-08-31 ENCOUNTER — Other Ambulatory Visit: Payer: Self-pay | Admitting: Oncology

## 2021-08-31 ENCOUNTER — Other Ambulatory Visit (HOSPITAL_COMMUNITY): Payer: Self-pay

## 2021-08-31 ENCOUNTER — Other Ambulatory Visit (HOSPITAL_BASED_OUTPATIENT_CLINIC_OR_DEPARTMENT_OTHER): Payer: Self-pay

## 2021-08-31 DIAGNOSIS — C221 Intrahepatic bile duct carcinoma: Secondary | ICD-10-CM

## 2021-09-04 NOTE — Telephone Encounter (Signed)
Per Dr Benay Spice wait to refill medication

## 2021-09-05 ENCOUNTER — Encounter: Payer: Self-pay | Admitting: Oncology

## 2021-09-05 ENCOUNTER — Other Ambulatory Visit (HOSPITAL_COMMUNITY): Payer: Self-pay

## 2021-09-05 ENCOUNTER — Other Ambulatory Visit: Payer: Self-pay | Admitting: Oncology

## 2021-09-05 DIAGNOSIS — C221 Intrahepatic bile duct carcinoma: Secondary | ICD-10-CM

## 2021-09-06 ENCOUNTER — Ambulatory Visit (HOSPITAL_BASED_OUTPATIENT_CLINIC_OR_DEPARTMENT_OTHER)
Admission: RE | Admit: 2021-09-06 | Discharge: 2021-09-06 | Disposition: A | Discharge status: Home / Self Care | Payer: No Typology Code available for payment source | Source: Ambulatory Visit | Attending: Oncology | Admitting: Oncology

## 2021-09-06 ENCOUNTER — Inpatient Hospital Stay: Payer: No Typology Code available for payment source | Attending: Oncology

## 2021-09-06 ENCOUNTER — Inpatient Hospital Stay: Payer: No Typology Code available for payment source

## 2021-09-06 ENCOUNTER — Encounter: Payer: Self-pay | Admitting: *Deleted

## 2021-09-06 ENCOUNTER — Encounter (HOSPITAL_BASED_OUTPATIENT_CLINIC_OR_DEPARTMENT_OTHER): Payer: Self-pay

## 2021-09-06 DIAGNOSIS — E785 Hyperlipidemia, unspecified: Secondary | ICD-10-CM | POA: Insufficient documentation

## 2021-09-06 DIAGNOSIS — C221 Intrahepatic bile duct carcinoma: Secondary | ICD-10-CM | POA: Diagnosis not present

## 2021-09-06 DIAGNOSIS — I10 Essential (primary) hypertension: Secondary | ICD-10-CM | POA: Insufficient documentation

## 2021-09-06 DIAGNOSIS — R630 Anorexia: Secondary | ICD-10-CM | POA: Insufficient documentation

## 2021-09-06 DIAGNOSIS — D6959 Other secondary thrombocytopenia: Secondary | ICD-10-CM | POA: Insufficient documentation

## 2021-09-06 DIAGNOSIS — R059 Cough, unspecified: Secondary | ICD-10-CM | POA: Insufficient documentation

## 2021-09-06 DIAGNOSIS — R634 Abnormal weight loss: Secondary | ICD-10-CM | POA: Insufficient documentation

## 2021-09-06 DIAGNOSIS — Z5111 Encounter for antineoplastic chemotherapy: Secondary | ICD-10-CM | POA: Insufficient documentation

## 2021-09-06 DIAGNOSIS — E1142 Type 2 diabetes mellitus with diabetic polyneuropathy: Secondary | ICD-10-CM | POA: Insufficient documentation

## 2021-09-06 DIAGNOSIS — M549 Dorsalgia, unspecified: Secondary | ICD-10-CM | POA: Insufficient documentation

## 2021-09-06 LAB — CMP (CANCER CENTER ONLY)
ALT: 31 U/L (ref 0–44)
AST: 186 U/L (ref 15–41)
Albumin: 3 g/dL — ABNORMAL LOW (ref 3.5–5.0)
Alkaline Phosphatase: 168 U/L — ABNORMAL HIGH (ref 38–126)
Anion gap: 6 (ref 5–15)
BUN: 11 mg/dL (ref 6–20)
CO2: 27 mmol/L (ref 22–32)
Calcium: 10.1 mg/dL (ref 8.9–10.3)
Chloride: 99 mmol/L (ref 98–111)
Creatinine: 0.77 mg/dL (ref 0.61–1.24)
GFR, Estimated: >60 mL/min (ref >60)
Glucose, Bld: 138 mg/dL — ABNORMAL HIGH (ref 70–99)
Potassium: 4 mmol/L (ref 3.5–5.1)
Sodium: 132 mmol/L — ABNORMAL LOW (ref 135–145)
Total Bilirubin: 3.6 mg/dL (ref 0.3–1.2)
Total Protein: 6.6 g/dL (ref 6.5–8.1)

## 2021-09-06 LAB — CBC WITH DIFFERENTIAL (CANCER CENTER ONLY)
Abs Immature Granulocytes: 0.02 10*3/uL (ref 0.00–0.07)
Basophils Absolute: 0.1 10*3/uL (ref 0.0–0.1)
Basophils Relative: 1 %
Eosinophils Absolute: 0.2 10*3/uL (ref 0.0–0.5)
Eosinophils Relative: 2 %
HCT: 40 % (ref 39.0–52.0)
Hemoglobin: 13.6 g/dL (ref 13.0–17.0)
Immature Granulocytes: 0 %
Lymphocytes Relative: 22 %
Lymphs Abs: 1.6 10*3/uL (ref 0.7–4.0)
MCH: 33.1 pg (ref 26.0–34.0)
MCHC: 34 g/dL (ref 30.0–36.0)
MCV: 97.3 fL (ref 80.0–100.0)
Monocytes Absolute: 0.9 10*3/uL (ref 0.1–1.0)
Monocytes Relative: 12 %
Neutro Abs: 4.4 10*3/uL (ref 1.7–7.7)
Neutrophils Relative %: 63 %
Platelet Count: 108 10*3/uL — ABNORMAL LOW (ref 150–400)
RBC: 4.11 MIL/uL — ABNORMAL LOW (ref 4.22–5.81)
RDW: 15.2 % (ref 11.5–15.5)
WBC Count: 7.1 10*3/uL (ref 4.0–10.5)
nRBC: 0 % (ref 0.0–0.2)

## 2021-09-06 LAB — MAGNESIUM: Magnesium: 2 mg/dL (ref 1.7–2.4)

## 2021-09-06 MED ORDER — IOHEXOL 300 MG/ML  SOLN
100.0000 mL | Freq: Once | INTRAMUSCULAR | Status: AC | PRN
  Administered 2021-09-06: 85 mL via INTRAVENOUS

## 2021-09-06 MED ORDER — HEPARIN SOD (PORK) LOCK FLUSH 100 UNIT/ML IV SOLN
500.0000 [IU] | Freq: Once | INTRAVENOUS | Status: AC
  Administered 2021-09-06: 500 [IU] via INTRAVENOUS

## 2021-09-06 NOTE — Progress Notes (Signed)
CRITICAL VALUE STICKER  CRITICAL VALUE: T. Bili= 3.6; and AST 186  RECEIVER (on-site recipient of call): Itzae Mccurdy,RN  DATE & TIME NOTIFIED: 09/06/21 @ 0912  MESSENGER (representative from lab): Otila Kluver  MD NOTIFIED: Dr. Benay Spice  TIME OF NOTIFICATION: 09/06/21 @ 0915  RESPONSE: Will see patient tomorrow and follow up on CT scan being done today.

## 2021-09-07 ENCOUNTER — Other Ambulatory Visit (HOSPITAL_BASED_OUTPATIENT_CLINIC_OR_DEPARTMENT_OTHER): Payer: Self-pay

## 2021-09-07 ENCOUNTER — Encounter: Payer: Self-pay | Admitting: Oncology

## 2021-09-07 ENCOUNTER — Other Ambulatory Visit (HOSPITAL_COMMUNITY): Payer: Self-pay

## 2021-09-07 ENCOUNTER — Inpatient Hospital Stay (HOSPITAL_BASED_OUTPATIENT_CLINIC_OR_DEPARTMENT_OTHER): Payer: No Typology Code available for payment source | Admitting: Oncology

## 2021-09-07 VITALS — BP 138/79 | HR 72 | Temp 98.8°F | Resp 20 | Ht 73.0 in | Wt 238.0 lb

## 2021-09-07 DIAGNOSIS — I10 Essential (primary) hypertension: Secondary | ICD-10-CM | POA: Diagnosis not present

## 2021-09-07 DIAGNOSIS — C221 Intrahepatic bile duct carcinoma: Secondary | ICD-10-CM

## 2021-09-07 DIAGNOSIS — E785 Hyperlipidemia, unspecified: Secondary | ICD-10-CM | POA: Diagnosis not present

## 2021-09-07 DIAGNOSIS — D6959 Other secondary thrombocytopenia: Secondary | ICD-10-CM | POA: Diagnosis not present

## 2021-09-07 DIAGNOSIS — E1142 Type 2 diabetes mellitus with diabetic polyneuropathy: Secondary | ICD-10-CM | POA: Diagnosis not present

## 2021-09-07 DIAGNOSIS — R634 Abnormal weight loss: Secondary | ICD-10-CM | POA: Diagnosis not present

## 2021-09-07 DIAGNOSIS — R059 Cough, unspecified: Secondary | ICD-10-CM | POA: Diagnosis not present

## 2021-09-07 DIAGNOSIS — Z5111 Encounter for antineoplastic chemotherapy: Secondary | ICD-10-CM | POA: Diagnosis present

## 2021-09-07 DIAGNOSIS — R630 Anorexia: Secondary | ICD-10-CM | POA: Diagnosis not present

## 2021-09-07 DIAGNOSIS — M549 Dorsalgia, unspecified: Secondary | ICD-10-CM | POA: Diagnosis not present

## 2021-09-07 MED ORDER — OMNIPOD 5 DEXG7G6 PODS GEN 5 MISC
0 refills | Status: AC
  Filled 2021-09-07: qty 15, 30d supply, fill #0
  Filled 2021-10-09: qty 15, 30d supply, fill #1
  Filled 2021-11-05: qty 15, 30d supply, fill #2

## 2021-09-07 NOTE — Progress Notes (Signed)
[facility] OFFICE PROGRESS NOTE   Diagnosis: Cholangiocarcinoma  INTERVAL HISTORY:   Mr. Lambson returns as scheduled.  He is here with his wife.  He complains of increased abdominal distention.  The cough has improved with medical therapy.  He will leave on a vacation to [place] next week.  Objective:  Vital signs in last 24 hours:  Blood pressure 138/79, pulse 72, temperature 98.8 F (37.1 C), temperature source Oral, resp. rate 20, height 6' 1"  (1.854 m), weight 238 lb (108 kg), SpO2 97 %. Resp: Decreased breath sounds at the right lower posterior chest, no respiratory distress Cardio: Regular rate and rhythm GI: Nontender, mild distention, I cannot palpate a discrete liver edge Vascular: No leg edema   Portacath/PICC-without erythema  Lab Results:  Lab Results  Component Value Date   WBC 7.1 09/06/2021   HGB 13.6 09/06/2021   HCT 40.0 09/06/2021   MCV 97.3 09/06/2021   PLT 108 (L) 09/06/2021   NEUTROABS 4.4 09/06/2021    CMP  Lab Results  Component Value Date   NA 132 (L) 09/06/2021   K 4.0 09/06/2021   CL 99 09/06/2021   CO2 27 09/06/2021   GLUCOSE 138 (H) 09/06/2021   BUN 11 09/06/2021   CREATININE 0.77 09/06/2021   CALCIUM 10.1 09/06/2021   PROT 6.6 09/06/2021   ALBUMIN 3.0 (L) 09/06/2021   AST 186 (HH) 09/06/2021   ALT 31 09/06/2021   ALKPHOS 168 (H) 09/06/2021   BILITOT 3.6 (HH) 09/06/2021   GFRNONAA >60 09/06/2021   GFRAA 127 01/11/2020    Lab Results  Component Value Date   CEA1 2.15 01/14/2020   GQQ761 123 (H) 07/03/2021    Imaging:  CT ABDOMEN PELVIS W CONTRAST  Result Date: 09/06/2021 CLINICAL DATA:  Cholangiocarcinoma. Restaging. * Tracking Code: BO * EXAM: CT ABDOMEN AND PELVIS WITH CONTRAST TECHNIQUE: Multidetector CT imaging of the abdomen and pelvis was performed using the standard protocol following bolus administration of intravenous contrast. RADIATION DOSE REDUCTION: This exam was performed according to the  departmental dose-optimization program which includes automated exposure control, adjustment of the mA and/or kV according to patient size and/or use of iterative reconstruction technique. CONTRAST:  70m OMNIPAQUE IOHEXOL 300 MG/ML  SOLN COMPARISON:  07/13/2021 FINDINGS: Lower chest: Subsegmental atelectasis noted in both lung bases. Stable enlarged pre cardiac lymph node on 13/2. Hepatobiliary: Liver is enlarged. Multiple hepatic metastases again noted. Index lesion towards the dome measured previously at 3.7 cm is 4.4 cm today on image 16/2. Index lesion posterior right liver measured previously at 3.7 cm is now 4.0 cm (image 36/2). 3.5 cm posterior right hepatic lobe index lesion is 4.7 cm today on image 32/2. A final index lesion measured previously in the anterior right liver at 1.8 cm is 2.2 cm on image 30/2 today. Gallbladder is nondistended with gallbladder wall edema. No intrahepatic or extrahepatic biliary dilation. Pancreas: No focal mass lesion. No dilatation of the main duct. No intraparenchymal cyst. No peripancreatic edema. Spleen: Stable splenomegaly. Adrenals/Urinary Tract: No adrenal nodule or mass. Kidneys unremarkable. No evidence for hydroureter. The urinary bladder appears normal for the degree of distention. Stomach/Bowel: Stomach is unremarkable. No gastric wall thickening. No evidence of outlet obstruction. Duodenum is normally positioned as is the ligament of Treitz. No small bowel wall thickening. No small bowel dilatation. The terminal ileum is normal. The appendix is not well visualized, but there is no edema or inflammation in the region of the cecum. No gross colonic mass. No  colonic wall thickening. Vascular/Lymphatic: No abdominal aortic aneurysm. Portal vein, superior mesenteric vein, and splenic vein are patent. Necrotic lymphadenopathy in the hepatoduodenal ligament again noted. 3.1 cm short axis lymph node identified image 40/series 2, not substantially changed. 2.2 cm lymph  node identified on image 31/2, similar to prior. Small para-aortic lymph nodes are seen in the abdomen. No pelvic sidewall lymphadenopathy. Reproductive: The prostate gland and seminal vesicles are unremarkable. Other: Moderate volume ascites in the abdomen and pelvis is progressive in the interval with only trace free fluid seen on the prior study. Musculoskeletal: No worrisome lytic or sclerotic osseous abnormality. IMPRESSION: 1. Continued progression of liver metastases. Necrotic metastatic lymphadenopathy in the periportal region shows no substantial change. 2. Interval progression of ascites with moderate volume seen today increased from trace intraperitoneal free fluid previously. 3. Stable hepatosplenomegaly. 4. Nondistended gallbladder with gallbladder wall edema. Appearance is likely secondary to hepatic disease is no gallstones are evident. Acute cholecystitis is considered less likely. Electronically Signed   By: Misty Stanley M.D.   On: 09/06/2021 13:01    Medications: I have reviewed the patient's current medications.   Assessment/Plan:  Cholangiocarcinoma  multiple liver masses and abdominal lymphadenopathy CTs 01/13/2020-rounded hypodense mass appears to arise from the pancreas neck, multiple rim-enhancing masses in the liver, primarily left liver with segmental dilation of the left lobe Intermatic bile ducts, ill-defined hypodensity the central liver with effacement of the left portal vein, enlarged portacaval and retroperitoneal lymph nodes Ultrasound-guided biopsy of the left liver lesion 01/19/2020-adenocarcinoma, cytokeratin 7+, MSS, tumor mutation burden 1, IDH1 R132C MRI abdomen 01/31/2020-poorly marginated central liver mass, multiple smaller similar satellite liver masses, extrinsic mass-effect at the biliary hilum with intrahepatic biliary ductal dilatation throughout the left liver with mild Intermatic dilatation the superior right liver, no pancreas mass or ductal dilatation,  normal spleen size, numerous enlarged enhancing lymph nodes at the porta hepatis, peripancreatic, portacaval, aortocaval, left periaortic chains Cycle 1 gemcitabine/cisplatin 02/04/2020 Cycle 2 gemcitabine/cisplatin 02/28/2020 Cycle 3 gemcitabine/cisplatin 03/20/2020 CT abdomen/pelvis 03/31/2020-mild decrease in central liver tumor, mild decrease in size of liver metastases and upper abdominal adenopathy, new splenomegaly Cycle 4 gemcitabine/cisplatin 04/10/2020 Cycle 5 gemcitabine/cisplatin 05/01/2020 Cycle 6 gemcitabine/cisplatin plus durvalumab 05/22/2020 CTs 06/06/2020- dominant central liver mass mildly enlarged, other liver lesions and abdominal adenopathy is stable, no evidence of metastatic disease to the chest Cycle 7 gemcitabine/cisplatin plus Durvalumab 06/12/2020 Cycle 8 gemcitabine/cisplatin plus Durvalumab 06/30/2020 Cycle 9 gemcitabine/cisplatin plus Durvalumab 07/31/2020 CT abdomen/pelvis 08/20/2020-decreased size of dominant central liver mass, slight increase in size and stable additional liver lesions, stable portacaval node Cycle 10 gemcitabine/cisplatin plus Durvalumab 08/21/2020 Cycle 11 gemcitabine/cisplatin plus Durvalumab 09/11/2020 Cycle 12 gemcitabine/cisplatin plus Durvalumab 10/02/2020 Cycle 13 gemcitabine/cisplatin plus Durvalumab 10/23/2020 CT abdomen/pelvis 11/10/2020-no change in dominant central liver mass and additional liver lesions, stable upper retroperitoneal adenopathy Cycle 14 gemcitabine/cisplatin plus Durvalumab 11/13/2020 Cycle 15 gemcitabine/cisplatin plus Durvalumab 12/04/2020 Cycle 15 gemcitabine/cisplatin plus Durvalumab 01/08/2021 Cycle 16 gemcitabine/cisplatin plus Durvalumab 01/30/2021-cisplatin held from day 1 and day 8 secondary to neuropathy CTs 02/15/2021-slight increase in size of central liver mass, progressive metastatic lesions in the right liver, stable to slightly decreased periportal lymph nodes, progression of splenomegaly, chronically thrombosed left  portal vein Cycle 1 FOLFOX 03/05/2021 Cycle 2 FOLFOX 03/19/2021, oxaliplatin dose reduced due to thrombocytopenia Cycle 3 FOLFOX 04/02/2021 Cycle 4 FOLFOX 04/16/2021 Cycle 5 FOLFOX 04/30/2021 CTs 05/10/2021-new and enlarging liver metastases, chronic left lobe biliary dilatation, chronic left portal vein thrombosis, stable porta hepatis adenopathy, splenomegaly, no evidence of metastatic disease  to the chest Ivosidenib 05/21/2021 CT 07/13/2021-increase in size of periportal node, stable retroperitoneal nodes, dominant left hepatic mass-stable, increased necrosis of other liver lesions, some have increased in size Ivosidenib continued CT abdomen/pelvis 09/06/2021-progression of ascites, progression of liver metastases, stable necrotic lymphadenopathy in the periportal region Ivosedinib discontinued 09/07/2021   Cough-likely related to diaphragmatic irritation from #1 Anorexia/weight loss Hypercalcemia-likely hypercalcemia malignancy, status post intravenous hydration and Zometa 01/14/2020 Diabetes Hyperlipidemia Hypertension History of peripheral neuropathy secondary to diabetes Severe back pain following Fulphila-oxycodone prescribed Thrombocytopenia secondary to chemotherapy-gemcitabine held with day 1 cycle 8 and then dose reduced COVID-19 infection 07/22/2020, 12/25/2020 Cisplatin induced peripheral neuropathy    Disposition: Mr. Victorian has metastatic cholangiocarcinoma.  There is clinical and radiologic evidence of disease progression.  I reviewed the CT findings and images with Mr. Pledger and his wife.  We discussed treatment options.  He understands standard systemic options are limited.  He would like to proceed with FOLFIRI as recommended by Dr.Uronis.  We reviewed potential toxicities associated with the FOLFIRI regimen including the chance of acute/delayed diarrhea, nausea/vomiting, alopecia, and hematologic toxicity.  He understands the potential for increased toxicity secondary to  hyperbilirubinemia.  The irinotecan and 5-fluorouracil will be dose reduced.  He agrees to proceed.  A chemotherapy plan was entered today.  He will be out of town for vacation over the next week.  He will return for an office visit and cycle 1 FOLFIRI on 09/24/2021.    Betsy Coder, MD  09/07/2021  3:50 PM

## 2021-09-07 NOTE — Progress Notes (Signed)
DISCONTINUE OFF PATHWAY REGIMEN - Other   [id number]:mFOLFOX6 (Leucovorin IV D1 + Fluorouracil IV D1/CIV D1,2 + Oxaliplatin IV D1) q14 Days:   A cycle is every 14 days:     Oxaliplatin      Leucovorin      Fluorouracil      Fluorouracil   **Always confirm dose/schedule in your pharmacy ordering system**  REASON: Disease Progression PRIOR TREATMENT: [id number] (Leucovorin IV D1 + Fluorouracil IV D1/CIV D1,2 + Oxaliplatin IV D1) q14 Days TREATMENT RESPONSE: Progressive Disease (PD)  START OFF PATHWAY REGIMEN - Other   [id number] (Leucovorin IV D1 + Fluorouracil IV D1/CIV D1,2 + Irinotecan IV D1) q14 Days:   A cycle is every 14 days:     Irinotecan      Leucovorin      Fluorouracil      Fluorouracil   **Always confirm dose/schedule in your pharmacy ordering system**  Patient Characteristics: Intent of Therapy: Non-Curative / Palliative Intent, Discussed with Patient

## 2021-09-08 ENCOUNTER — Other Ambulatory Visit: Payer: Self-pay

## 2021-09-08 LAB — CANCER ANTIGEN 19-9: CA 19-9: 139 U/mL — ABNORMAL HIGH (ref 0–35)

## 2021-09-10 ENCOUNTER — Ambulatory Visit (HOSPITAL_COMMUNITY)
Admission: RE | Admit: 2021-09-10 | Discharge: 2021-09-10 | Disposition: A | Discharge status: Home / Self Care | Payer: No Typology Code available for payment source | Source: Ambulatory Visit | Attending: Oncology | Admitting: Oncology

## 2021-09-10 ENCOUNTER — Encounter: Payer: Self-pay | Admitting: *Deleted

## 2021-09-10 DIAGNOSIS — R188 Other ascites: Secondary | ICD-10-CM | POA: Insufficient documentation

## 2021-09-10 DIAGNOSIS — C221 Intrahepatic bile duct carcinoma: Secondary | ICD-10-CM | POA: Diagnosis present

## 2021-09-10 HISTORY — PX: IR PARACENTESIS: IMG2679

## 2021-09-10 MED ORDER — LIDOCAINE HCL 1 % IJ SOLN
INTRAMUSCULAR | Status: AC
  Filled 2021-09-10: qty 20

## 2021-09-10 NOTE — Progress Notes (Signed)
Pharmacist Chemotherapy Monitoring - Initial Assessment    Anticipated start date: 09/24/21   The following has been reviewed per standard work regarding the patient's treatment regimen: The patient's diagnosis, treatment plan and drug doses, and organ/hematologic function Lab orders and baseline tests specific to treatment regimen  The treatment plan start date, drug sequencing, and pre-medications Prior authorization status  Patient's documented medication list, including drug-drug interaction screen and prescriptions for anti-emetics and supportive care specific to the treatment regimen The drug concentrations, fluid compatibility, administration routes, and timing of the medications to be used The patient's access for treatment and lifetime cumulative dose history, if applicable  The patient's medication allergies and previous infusion related reactions, if applicable   Changes made to treatment plan:  N/A  Follow up needed:  Pending authorization for treatment  Checking on Dex IV premed dose - previously decreased d/t hyperglycemia.   Kennith Center, Pharm.D., CPP 09/10/2021'@2'$ :03 PM

## 2021-09-10 NOTE — Procedures (Signed)
PROCEDURE SUMMARY:  Successful US guided paracentesis from RLQ.  Yielded 4.7L of ascitic fluid.  Small amount of bleeding in superficial layers post procedure.  No active bleeding.  Ice pack given.  Pt tolerated procedure and discharged in stable condition   Specimen was sent for labs.  EBL < 44m  Janyla Biscoe PA-C 09/10/2021 4:15 PM

## 2021-09-10 NOTE — Progress Notes (Signed)
Faxed CT report, labs and 09/07/21 office note and current med list as requested to [facility] #[phone] att: Nani Gasser along with future treatment plan and next appointment.

## 2021-09-11 ENCOUNTER — Other Ambulatory Visit (HOSPITAL_BASED_OUTPATIENT_CLINIC_OR_DEPARTMENT_OTHER): Payer: Self-pay

## 2021-09-11 LAB — CYTOLOGY - NON PAP

## 2021-09-13 ENCOUNTER — Other Ambulatory Visit (HOSPITAL_BASED_OUTPATIENT_CLINIC_OR_DEPARTMENT_OTHER): Payer: Self-pay

## 2021-09-17 ENCOUNTER — Telehealth: Payer: Self-pay

## 2021-09-17 ENCOUNTER — Other Ambulatory Visit: Payer: Self-pay

## 2021-09-17 ENCOUNTER — Other Ambulatory Visit (HOSPITAL_COMMUNITY): Payer: Self-pay

## 2021-09-17 ENCOUNTER — Encounter: Payer: Self-pay | Admitting: Family Medicine

## 2021-09-17 ENCOUNTER — Encounter: Payer: Self-pay | Admitting: Oncology

## 2021-09-17 DIAGNOSIS — C221 Intrahepatic bile duct carcinoma: Secondary | ICD-10-CM

## 2021-09-17 NOTE — Telephone Encounter (Signed)
Patient is schedule for his paracentesis on 09/21/21 at 1 on [place].

## 2021-09-17 NOTE — Telephone Encounter (Signed)
-----   Message from Owens Shark, NP sent at 09/17/2021  1:39 PM EDT ----- Please make sure fluid from upcoming paracentesis is sent for cytology.  Thanks

## 2021-09-18 ENCOUNTER — Other Ambulatory Visit (HOSPITAL_BASED_OUTPATIENT_CLINIC_OR_DEPARTMENT_OTHER): Payer: Self-pay

## 2021-09-18 ENCOUNTER — Other Ambulatory Visit: Payer: Self-pay | Admitting: Family Medicine

## 2021-09-18 MED ORDER — FLUOXETINE HCL 20 MG PO CAPS
20.0000 mg | ORAL_CAPSULE | Freq: Every day | ORAL | 3 refills | Status: AC
  Filled 2021-09-18: qty 90, 90d supply, fill #0
  Filled 2021-12-14: qty 90, 90d supply, fill #1

## 2021-09-21 ENCOUNTER — Ambulatory Visit (HOSPITAL_COMMUNITY)
Admission: RE | Admit: 2021-09-21 | Discharge: 2021-09-21 | Disposition: A | Discharge status: Home / Self Care | Payer: No Typology Code available for payment source | Source: Ambulatory Visit | Attending: Nurse Practitioner | Admitting: Nurse Practitioner

## 2021-09-21 DIAGNOSIS — R188 Other ascites: Secondary | ICD-10-CM | POA: Insufficient documentation

## 2021-09-21 DIAGNOSIS — C221 Intrahepatic bile duct carcinoma: Secondary | ICD-10-CM | POA: Insufficient documentation

## 2021-09-21 HISTORY — PX: IR PARACENTESIS: IMG2679

## 2021-09-21 MED ORDER — LIDOCAINE HCL 1 % IJ SOLN
INTRAMUSCULAR | Status: AC
  Administered 2021-09-21: 10 mL
  Filled 2021-09-21: qty 20

## 2021-09-21 NOTE — Procedures (Signed)
Ultrasound-guided diagnostic and therapeutic paracentesis performed yielding 5 liters of straw colored fluid.  Fluid was sent to lab for analysis. No immediate complications. EBL is none. Max of 5 Liters

## 2021-09-23 ENCOUNTER — Other Ambulatory Visit: Payer: Self-pay

## 2021-09-23 ENCOUNTER — Other Ambulatory Visit: Payer: Self-pay | Admitting: Oncology

## 2021-09-23 NOTE — Progress Notes (Signed)
OFF PATHWAY REGIMEN - Other  No Change  Continue With Treatment as Ordered.  Original Decision Date/Time: 09/07/2021 15:57   [id number] (Leucovorin IV D1 + Fluorouracil IV D1/CIV D1,2 + Irinotecan IV D1) q14 Days:   A cycle is every 14 days:     Irinotecan      Leucovorin      Fluorouracil      Fluorouracil   **Always confirm dose/schedule in your pharmacy ordering system**  Patient Characteristics: Intent of Therapy: Non-Curative / Palliative Intent, Discussed with Patient

## 2021-09-24 ENCOUNTER — Telehealth: Payer: Self-pay

## 2021-09-24 ENCOUNTER — Inpatient Hospital Stay (HOSPITAL_BASED_OUTPATIENT_CLINIC_OR_DEPARTMENT_OTHER): Payer: No Typology Code available for payment source | Admitting: Nurse Practitioner

## 2021-09-24 ENCOUNTER — Inpatient Hospital Stay: Payer: No Typology Code available for payment source

## 2021-09-24 ENCOUNTER — Encounter: Payer: Self-pay | Admitting: *Deleted

## 2021-09-24 ENCOUNTER — Encounter: Payer: Self-pay | Admitting: Nurse Practitioner

## 2021-09-24 VITALS — BP 141/91 | HR 70 | Temp 98.2°F | Resp 18 | Ht 73.0 in | Wt 232.0 lb

## 2021-09-24 DIAGNOSIS — C221 Intrahepatic bile duct carcinoma: Secondary | ICD-10-CM

## 2021-09-24 DIAGNOSIS — Z5111 Encounter for antineoplastic chemotherapy: Secondary | ICD-10-CM | POA: Diagnosis not present

## 2021-09-24 DIAGNOSIS — Z95828 Presence of other vascular implants and grafts: Secondary | ICD-10-CM

## 2021-09-24 LAB — CMP (CANCER CENTER ONLY)
ALT: 19 U/L (ref 0–44)
AST: 100 U/L — ABNORMAL HIGH (ref 15–41)
Albumin: 2.8 g/dL — ABNORMAL LOW (ref 3.5–5.0)
Alkaline Phosphatase: 252 U/L — ABNORMAL HIGH (ref 38–126)
Anion gap: 6 (ref 5–15)
BUN: 12 mg/dL (ref 6–20)
CO2: 29 mmol/L (ref 22–32)
Calcium: 10.1 mg/dL (ref 8.9–10.3)
Chloride: 98 mmol/L (ref 98–111)
Creatinine: 0.74 mg/dL (ref 0.61–1.24)
GFR, Estimated: >60 mL/min (ref >60)
Glucose, Bld: 106 mg/dL — ABNORMAL HIGH (ref 70–99)
Potassium: 4.1 mmol/L (ref 3.5–5.1)
Sodium: 133 mmol/L — ABNORMAL LOW (ref 135–145)
Total Bilirubin: 2.6 mg/dL — ABNORMAL HIGH (ref 0.3–1.2)
Total Protein: 6.8 g/dL (ref 6.5–8.1)

## 2021-09-24 LAB — CBC WITH DIFFERENTIAL (CANCER CENTER ONLY)
Abs Immature Granulocytes: 0.03 10*3/uL (ref 0.00–0.07)
Basophils Absolute: 0.1 10*3/uL (ref 0.0–0.1)
Basophils Relative: 1 %
Eosinophils Absolute: 0.2 10*3/uL (ref 0.0–0.5)
Eosinophils Relative: 3 %
HCT: 41.4 % (ref 39.0–52.0)
Hemoglobin: 14.2 g/dL (ref 13.0–17.0)
Immature Granulocytes: 0 %
Lymphocytes Relative: 22 %
Lymphs Abs: 1.6 10*3/uL (ref 0.7–4.0)
MCH: 33.1 pg (ref 26.0–34.0)
MCHC: 34.3 g/dL (ref 30.0–36.0)
MCV: 96.5 fL (ref 80.0–100.0)
Monocytes Absolute: 0.7 10*3/uL (ref 0.1–1.0)
Monocytes Relative: 10 %
Neutro Abs: 4.6 10*3/uL (ref 1.7–7.7)
Neutrophils Relative %: 64 %
Platelet Count: 161 10*3/uL (ref 150–400)
RBC: 4.29 MIL/uL (ref 4.22–5.81)
RDW: 15.4 % (ref 11.5–15.5)
WBC Count: 7.2 10*3/uL (ref 4.0–10.5)
nRBC: 0 % (ref 0.0–0.2)

## 2021-09-24 MED ORDER — SODIUM CHLORIDE 0.9 % IV SOLN
200.0000 mg/m2 | Freq: Once | INTRAVENOUS | Status: AC
  Administered 2021-09-24: 466 mg via INTRAVENOUS
  Filled 2021-09-24 (×2): qty 23.3

## 2021-09-24 MED ORDER — FLUOROURACIL CHEMO INJECTION 500 MG/10ML
200.0000 mg/m2 | Freq: Once | INTRAVENOUS | Status: AC
  Administered 2021-09-24: 450 mg via INTRAVENOUS
  Filled 2021-09-24: qty 9

## 2021-09-24 MED ORDER — PALONOSETRON HCL INJECTION 0.25 MG/5ML
0.2500 mg | Freq: Once | INTRAVENOUS | Status: AC
  Administered 2021-09-24: 0.25 mg via INTRAVENOUS
  Filled 2021-09-24: qty 5

## 2021-09-24 MED ORDER — SODIUM CHLORIDE 0.9 % IV SOLN: Freq: Once | INTRAVENOUS | Status: AC

## 2021-09-24 MED ORDER — DEXAMETHASONE SODIUM PHOSPHATE 10 MG/ML IJ SOLN
5.0000 mg | Freq: Once | INTRAMUSCULAR | Status: AC
  Administered 2021-09-24: 5 mg via INTRAVENOUS
  Filled 2021-09-24: qty 1

## 2021-09-24 MED ORDER — ATROPINE SULFATE 1 MG/ML IV SOLN
0.5000 mg | Freq: Once | INTRAVENOUS | Status: AC | PRN
  Administered 2021-09-24: 0.5 mg via INTRAVENOUS
  Filled 2021-09-24: qty 1

## 2021-09-24 MED ORDER — ZOLEDRONIC ACID 4 MG/100ML IV SOLN
4.0000 mg | Freq: Once | INTRAVENOUS | Status: AC
  Administered 2021-09-24: 4 mg via INTRAVENOUS
  Filled 2021-09-24: qty 100

## 2021-09-24 MED ORDER — SODIUM CHLORIDE 0.9 % IV SOLN: 5.0000 mg | Freq: Once | INTRAVENOUS | Status: DC

## 2021-09-24 MED ORDER — SODIUM CHLORIDE 0.9 % IV SOLN
90.0000 mg/m2 | Freq: Once | INTRAVENOUS | Status: AC
  Administered 2021-09-24: 200 mg via INTRAVENOUS
  Filled 2021-09-24: qty 10

## 2021-09-24 MED ORDER — SODIUM CHLORIDE 0.9% FLUSH
10.0000 mL | INTRAVENOUS | Status: DC | PRN
  Administered 2021-09-24: 10 mL via INTRAVENOUS

## 2021-09-24 MED ORDER — SODIUM CHLORIDE 0.9 % IV SOLN
1800.0000 mg/m2 | INTRAVENOUS | Status: DC
  Administered 2021-09-24: 4200 mg via INTRAVENOUS
  Filled 2021-09-24: qty 84

## 2021-09-24 NOTE — Progress Notes (Signed)
Ok to give Zometa today w/ Corr Ca = 11.06 and pt w/ confusion per Ned Card, NP.  Kennith Center, Pharm.D., CPP 09/24/2021'@12'$ :59 PM

## 2021-09-24 NOTE — Progress Notes (Signed)
[facility] OFFICE PROGRESS NOTE   Diagnosis: Cholangiocarcinoma  INTERVAL HISTORY:   Matthew Osborne returns as scheduled.  He had a paracentesis 09/21/2021 with 5 L of fluid removed.  Pathology is pending.  He noted temporary improvement in abdominal discomfort and nausea following the procedure.  He would like to have another paracentesis procedure this week.  His wife is interested in him receiving albumin with the procedure.  His appetite is poor.  He feels "full".  Bowels are moving.  Objective:  Vital signs in last 24 hours:  Blood pressure (!) 141/91, pulse 70, temperature 98.2 F (36.8 C), temperature source Oral, resp. rate 18, height $RemoveBe'6\' 1"'iLQdpdoVS$  (1.854 m), weight 232 lb (105.2 kg), SpO2 100 %.    HEENT: Approximate 2 mm area of erythema right buccal mucosa. Resp: Lungs clear bilaterally. Cardio: Regular rate and rhythm. GI: Abdomen is distended, exam consistent with ascites. Vascular: No leg edema. Skin: Resolving ecchymosis right lateral abdominal wall. Port-A-Cath without erythema.  Lab Results:  Lab Results  Component Value Date   WBC 7.2 09/24/2021   HGB 14.2 09/24/2021   HCT 41.4 09/24/2021   MCV 96.5 09/24/2021   PLT 161 09/24/2021   NEUTROABS 4.6 09/24/2021    Imaging:  No results found.  Medications: I have reviewed the patient's current medications.  Assessment/Plan: Cholangiocarcinoma  multiple liver masses and abdominal lymphadenopathy CTs 01/13/2020-rounded hypodense mass appears to arise from the pancreas neck, multiple rim-enhancing masses in the liver, primarily left liver with segmental dilation of the left lobe Intermatic bile ducts, ill-defined hypodensity the central liver with effacement of the left portal vein, enlarged portacaval and retroperitoneal lymph nodes Ultrasound-guided biopsy of the left liver lesion 01/19/2020-adenocarcinoma, cytokeratin 7+, MSS, tumor mutation burden 1, IDH1 R132C MRI abdomen 01/31/2020-poorly marginated  central liver mass, multiple smaller similar satellite liver masses, extrinsic mass-effect at the biliary hilum with intrahepatic biliary ductal dilatation throughout the left liver with mild Intermatic dilatation the superior right liver, no pancreas mass or ductal dilatation, normal spleen size, numerous enlarged enhancing lymph nodes at the porta hepatis, peripancreatic, portacaval, aortocaval, left periaortic chains Cycle 1 gemcitabine/cisplatin 02/04/2020 Cycle 2 gemcitabine/cisplatin 02/28/2020 Cycle 3 gemcitabine/cisplatin 03/20/2020 CT abdomen/pelvis 03/31/2020-mild decrease in central liver tumor, mild decrease in size of liver metastases and upper abdominal adenopathy, new splenomegaly Cycle 4 gemcitabine/cisplatin 04/10/2020 Cycle 5 gemcitabine/cisplatin 05/01/2020 Cycle 6 gemcitabine/cisplatin plus durvalumab 05/22/2020 CTs 06/06/2020- dominant central liver mass mildly enlarged, other liver lesions and abdominal adenopathy is stable, no evidence of metastatic disease to the chest Cycle 7 gemcitabine/cisplatin plus Durvalumab 06/12/2020 Cycle 8 gemcitabine/cisplatin plus Durvalumab 06/30/2020 Cycle 9 gemcitabine/cisplatin plus Durvalumab 07/31/2020 CT abdomen/pelvis 08/20/2020-decreased size of dominant central liver mass, slight increase in size and stable additional liver lesions, stable portacaval node Cycle 10 gemcitabine/cisplatin plus Durvalumab 08/21/2020 Cycle 11 gemcitabine/cisplatin plus Durvalumab 09/11/2020 Cycle 12 gemcitabine/cisplatin plus Durvalumab 10/02/2020 Cycle 13 gemcitabine/cisplatin plus Durvalumab 10/23/2020 CT abdomen/pelvis 11/10/2020-no change in dominant central liver mass and additional liver lesions, stable upper retroperitoneal adenopathy Cycle 14 gemcitabine/cisplatin plus Durvalumab 11/13/2020 Cycle 15 gemcitabine/cisplatin plus Durvalumab 12/04/2020 Cycle 15 gemcitabine/cisplatin plus Durvalumab 01/08/2021 Cycle 16 gemcitabine/cisplatin plus Durvalumab  01/30/2021-cisplatin held from day 1 and day 8 secondary to neuropathy CTs 02/15/2021-slight increase in size of central liver mass, progressive metastatic lesions in the right liver, stable to slightly decreased periportal lymph nodes, progression of splenomegaly, chronically thrombosed left portal vein Cycle 1 FOLFOX 03/05/2021 Cycle 2 FOLFOX 03/19/2021, oxaliplatin dose reduced due to thrombocytopenia Cycle 3 FOLFOX 04/02/2021 Cycle 4  FOLFOX 04/16/2021 Cycle 5 FOLFOX 04/30/2021 CTs 05/10/2021-new and enlarging liver metastases, chronic left lobe biliary dilatation, chronic left portal vein thrombosis, stable porta hepatis adenopathy, splenomegaly, no evidence of metastatic disease to the chest Ivosidenib 05/21/2021 CT 07/13/2021-increase in size of periportal node, stable retroperitoneal nodes, dominant left hepatic mass-stable, increased necrosis of other liver lesions, some have increased in size Ivosidenib continued CT abdomen/pelvis 09/06/2021-progression of ascites, progression of liver metastases, stable necrotic lymphadenopathy in the periportal region Ivosedinib discontinued 09/07/2021 Cycle 1 FOLFIRI 09/24/2021   Cough-likely related to diaphragmatic irritation from #1 Anorexia/weight loss Hypercalcemia-likely hypercalcemia malignancy, status post intravenous hydration and Zometa 01/14/2020 Diabetes Hyperlipidemia Hypertension History of peripheral neuropathy secondary to diabetes Severe back pain following Fulphila-oxycodone prescribed Thrombocytopenia secondary to chemotherapy-gemcitabine held with day 1 cycle 8 and then dose reduced COVID-19 infection 07/22/2020, 12/25/2020 Cisplatin induced peripheral neuropathy    Disposition: Matthew Osborne appears unchanged.  He has metastatic cholangiocarcinoma with recent radiologic and clinical evidence of disease progression.  The plan is to proceed with FOLFIRI.  He agrees with this plan.  We again reviewed potential toxicities.  He understands the  potential for increased toxicity due to hyperbilirubinemia.  The irinotecan and 5-fluorouracil will be dose reduced.  He has recurrent ascites.  Cytology is pending from the most recent paracentesis.  He will be scheduled for another paracentesis later this week with albumin.  He will return for lab, follow-up, cycle 2 FOLFIRI on 10/09/2021.  He will contact the office in the interim with any problems.  Patient seen with Dr. Benay Spice.    Ned Card ANP/GNP-BC   09/24/2021  12:00 PM  This was a shared visit with Ned Card.  Matthew Osborne was interviewed and examined.  His overall status appears stable.  The plan is to begin FOLFIRI today.  We reviewed potential toxicities associated with the FOLFIRI regimen.  He understands the potential increased risk for toxicity with the abnormal bilirubin and liver enzymes.  I will dose reduce the 5-FU and irinotecan.  He will continue to undergo palliative paracentesis procedures as needed.  I was present for greater than 50% of today's visit.  I performed medical decision making.  Julieanne Manson, MD

## 2021-09-24 NOTE — Telephone Encounter (Signed)
Patient is schedule for his Paracentesis on 09/27/21 at 1 arrived at 1230 enter C. Patient expresses understanding and had no questions or concerns.

## 2021-09-24 NOTE — Progress Notes (Signed)
Her2 IHC testing requested from Liver biopsy 01/19/20 Accession number [id number]

## 2021-09-24 NOTE — Patient Instructions (Addendum)
[facility]  Discharge Instructions: Thank you for choosing [facility] to provide your oncology and hematology care.   If you have a lab appointment with the [facility], please go directly to the [facility] and check in at the registration area.   Wear comfortable clothing and clothing appropriate for easy access to any Portacath or PICC line.   We strive to give you quality time with your provider. You may need to reschedule your appointment if you arrive late (15 or more minutes).  Arriving late affects you and other patients whose appointments are after yours.  Also, if you miss three or more appointments without notifying the office, you may be dismissed from the clinic at the provider's discretion.      For prescription refill requests, have your pharmacy contact our office and allow 72 hours for refills to be completed.    Today you received the following chemotherapy and/or immunotherapy agents Irinotecan, Leucovorin and Adrucil      To help prevent nausea and vomiting after your treatment, we encourage you to take your nausea medication as directed.  BELOW ARE SYMPTOMS THAT SHOULD BE REPORTED IMMEDIATELY: *FEVER GREATER THAN 100.4 F (38 C) OR HIGHER *CHILLS OR SWEATING *NAUSEA AND VOMITING THAT IS NOT CONTROLLED WITH YOUR NAUSEA MEDICATION *UNUSUAL SHORTNESS OF BREATH *UNUSUAL BRUISING OR BLEEDING *URINARY PROBLEMS (pain or burning when urinating, or frequent urination) *BOWEL PROBLEMS (unusual diarrhea, constipation, pain near the anus) TENDERNESS IN MOUTH AND THROAT WITH OR WITHOUT PRESENCE OF ULCERS (sore throat, sores in mouth, or a toothache) UNUSUAL RASH, SWELLING OR PAIN  UNUSUAL VAGINAL DISCHARGE OR ITCHING   Items with * indicate a potential emergency and should be followed up as soon as possible or go to the Emergency Department if any problems should occur.  Please show the CHEMOTHERAPY ALERT CARD or IMMUNOTHERAPY ALERT  CARD at check-in to the Emergency Department and triage nurse.  Should you have questions after your visit or need to cancel or reschedule your appointment, please contact [facility]  Dept: [phone]  and follow the prompts.  Office hours are 8:00 a.m. to 4:30 p.m. Monday - Friday. Please note that voicemails left after 4:00 p.m. may not be returned until the following business day.  We are closed weekends and major holidays. You have access to a nurse at all times for urgent questions. Please call the main number to the clinic Dept: [phone] and follow the prompts.   For any non-urgent questions, you may also contact your provider using MyChart. We now offer e-Visits for anyone 80 and older to request care online for non-urgent symptoms. For details visit [email or web address].   Also download the MyChart app! Go to the app store, search "MyChart", open the app, select , and log in with your MyChart username and password.  Masks are optional in the cancer centers. If you would like for your care team to wear a mask while they are taking care of you, please let them know. You may have one support person who is at least 46 years old accompany you for your appointments.

## 2021-09-24 NOTE — Patient Instructions (Signed)

## 2021-09-24 NOTE — Progress Notes (Signed)
Patient seen by Ned Card NP today  Vitals are within treatment parameters.  Labs reviewed by Ned Card NP and are not all within treatment parameters. AST 100 T. Bili=2.6--OK to proceed  Per physician team, patient is ready for treatment. Please note that modifications are being made to the treatment plan including MD will dose reduce irinotecan due to liver functions and will decrease dexamethasone to 5 mg due to glucose.

## 2021-09-24 NOTE — Progress Notes (Signed)
Irinotecan handout given

## 2021-09-25 ENCOUNTER — Telehealth: Payer: Self-pay

## 2021-09-25 ENCOUNTER — Encounter: Payer: Self-pay | Admitting: Nurse Practitioner

## 2021-09-25 LAB — CYTOLOGY - NON PAP

## 2021-09-25 LAB — CANCER ANTIGEN 19-9: CA 19-9: 209 U/mL — ABNORMAL HIGH (ref 0–35)

## 2021-09-25 NOTE — Telephone Encounter (Signed)
24 Hour Call Back   Telephone call to patient post first time Irinotecan. Patient verbalized feeling very fatigued and spend most of his time in bed. He denied any other symptoms. He also reported that a connection on his pump came loose on his way home after treatment but his wife was able to make a call to [facility] and a nurse was able to work her through on how to get it connected back and infusion restarted. Patient stated that he was without chemo for 45 minutes to an hour. This Probation officer informed patient that his pump stop time on Wednesday is at 1:30 pm, however, he is welcomed to come in an hour later to make up for the time lost. Patient verbalized that he will come in at 2:30 pm.

## 2021-09-26 ENCOUNTER — Other Ambulatory Visit: Payer: Self-pay

## 2021-09-26 ENCOUNTER — Inpatient Hospital Stay: Payer: No Typology Code available for payment source

## 2021-09-26 VITALS — BP 140/85 | HR 75 | Temp 98.2°F | Resp 18

## 2021-09-26 DIAGNOSIS — C221 Intrahepatic bile duct carcinoma: Secondary | ICD-10-CM

## 2021-09-26 DIAGNOSIS — Z5111 Encounter for antineoplastic chemotherapy: Secondary | ICD-10-CM | POA: Diagnosis not present

## 2021-09-26 MED ORDER — SODIUM CHLORIDE 0.9% FLUSH
10.0000 mL | INTRAVENOUS | Status: DC | PRN
  Administered 2021-09-26: 10 mL

## 2021-09-26 MED ORDER — HEPARIN SOD (PORK) LOCK FLUSH 100 UNIT/ML IV SOLN
500.0000 [IU] | Freq: Once | INTRAVENOUS | Status: AC | PRN
  Administered 2021-09-26: 500 [IU]

## 2021-09-26 NOTE — Patient Instructions (Signed)

## 2021-09-27 ENCOUNTER — Encounter: Payer: Self-pay | Admitting: *Deleted

## 2021-09-27 ENCOUNTER — Other Ambulatory Visit: Payer: Self-pay | Admitting: Oncology

## 2021-09-27 ENCOUNTER — Ambulatory Visit (HOSPITAL_COMMUNITY)
Admission: RE | Admit: 2021-09-27 | Discharge: 2021-09-27 | Disposition: A | Discharge status: Home / Self Care | Payer: No Typology Code available for payment source | Source: Ambulatory Visit | Attending: Nurse Practitioner | Admitting: Nurse Practitioner

## 2021-09-27 DIAGNOSIS — R188 Other ascites: Secondary | ICD-10-CM | POA: Insufficient documentation

## 2021-09-27 DIAGNOSIS — C221 Intrahepatic bile duct carcinoma: Secondary | ICD-10-CM | POA: Diagnosis present

## 2021-09-27 HISTORY — PX: IR PARACENTESIS: IMG2679

## 2021-09-27 LAB — SURGICAL PATHOLOGY

## 2021-09-27 MED ORDER — ALBUMIN HUMAN 25 % IV SOLN: 25.0000 g | Freq: Once | INTRAVENOUS | Status: AC

## 2021-09-27 MED ORDER — LIDOCAINE HCL 1 % IJ SOLN
INTRAMUSCULAR | Status: AC
  Administered 2021-09-27: 10 mL
  Filled 2021-09-27: qty 20

## 2021-09-27 MED ORDER — ALBUMIN HUMAN 25 % IV SOLN: 25.0000 g | Freq: Once | INTRAVENOUS | Status: DC

## 2021-09-27 MED ORDER — ALBUMIN HUMAN 25 % IV SOLN
INTRAVENOUS | Status: AC
  Administered 2021-09-27: 25 g via INTRAVENOUS
  Filled 2021-09-27: qty 100

## 2021-09-27 NOTE — Progress Notes (Signed)
Testing for Her2 IHC from liver biopsy 01/19/2020, accession number [id number] requested on 09/24/2021

## 2021-09-28 ENCOUNTER — Other Ambulatory Visit: Payer: Self-pay | Admitting: Oncology

## 2021-10-01 ENCOUNTER — Other Ambulatory Visit (HOSPITAL_BASED_OUTPATIENT_CLINIC_OR_DEPARTMENT_OTHER): Payer: Self-pay

## 2021-10-01 ENCOUNTER — Other Ambulatory Visit: Payer: Self-pay

## 2021-10-01 ENCOUNTER — Encounter: Payer: Self-pay | Admitting: Oncology

## 2021-10-01 DIAGNOSIS — C221 Intrahepatic bile duct carcinoma: Secondary | ICD-10-CM

## 2021-10-01 LAB — CYTOLOGY - NON PAP

## 2021-10-03 ENCOUNTER — Ambulatory Visit (HOSPITAL_COMMUNITY)
Admission: RE | Admit: 2021-10-03 | Discharge: 2021-10-03 | Disposition: A | Discharge status: Home / Self Care | Payer: No Typology Code available for payment source | Source: Ambulatory Visit | Attending: Oncology | Admitting: Oncology

## 2021-10-03 DIAGNOSIS — C221 Intrahepatic bile duct carcinoma: Secondary | ICD-10-CM | POA: Diagnosis present

## 2021-10-03 DIAGNOSIS — R188 Other ascites: Secondary | ICD-10-CM | POA: Insufficient documentation

## 2021-10-03 HISTORY — PX: IR PARACENTESIS: IMG2679

## 2021-10-03 MED ORDER — LIDOCAINE HCL 1 % IJ SOLN
INTRAMUSCULAR | Status: AC
  Administered 2021-10-03: 20 mL
  Filled 2021-10-03: qty 20

## 2021-10-03 NOTE — Procedures (Signed)
PROCEDURE SUMMARY:  Successful US guided paracentesis from left lower quadrant.  Yielded 5.5 L of clear yellow fluid.  No immediate complications.  Pt tolerated well.   Specimen not sent for labs.  EBL < 2 mL  Theresa Duty, NP 10/03/2021 11:39 AM

## 2021-10-08 ENCOUNTER — Encounter: Payer: Self-pay | Admitting: Oncology

## 2021-10-08 ENCOUNTER — Other Ambulatory Visit: Payer: Self-pay | Admitting: Oncology

## 2021-10-08 DIAGNOSIS — C221 Intrahepatic bile duct carcinoma: Secondary | ICD-10-CM

## 2021-10-09 ENCOUNTER — Other Ambulatory Visit (HOSPITAL_BASED_OUTPATIENT_CLINIC_OR_DEPARTMENT_OTHER): Payer: Self-pay

## 2021-10-09 ENCOUNTER — Other Ambulatory Visit: Payer: Self-pay | Admitting: *Deleted

## 2021-10-09 ENCOUNTER — Other Ambulatory Visit: Payer: Self-pay | Admitting: Oncology

## 2021-10-09 DIAGNOSIS — C221 Intrahepatic bile duct carcinoma: Secondary | ICD-10-CM

## 2021-10-09 MED ORDER — PROCHLORPERAZINE MALEATE 10 MG PO TABS
10.0000 mg | ORAL_TABLET | Freq: Four times a day (QID) | ORAL | 1 refills | Status: DC | PRN
  Filled 2021-10-09: qty 60, 15d supply, fill #0
  Filled 2021-11-05: qty 60, 15d supply, fill #1

## 2021-10-09 NOTE — Progress Notes (Signed)
Wife requesting paracentesis today and Friday. Order placed for today w/Dr. Benay Spice saying 4 liter limit if being done twice in 1 week.  Paracentesis on 9/8 can remove 5 liters since he is going out of town.Notified Mrs. Endicott that orders are in and she can schedule.

## 2021-10-10 ENCOUNTER — Inpatient Hospital Stay: Payer: No Typology Code available for payment source

## 2021-10-10 ENCOUNTER — Other Ambulatory Visit (HOSPITAL_BASED_OUTPATIENT_CLINIC_OR_DEPARTMENT_OTHER): Payer: Self-pay

## 2021-10-10 ENCOUNTER — Inpatient Hospital Stay: Payer: No Typology Code available for payment source | Attending: Oncology

## 2021-10-10 ENCOUNTER — Inpatient Hospital Stay (HOSPITAL_BASED_OUTPATIENT_CLINIC_OR_DEPARTMENT_OTHER): Payer: No Typology Code available for payment source | Admitting: Nurse Practitioner

## 2021-10-10 ENCOUNTER — Encounter: Payer: Self-pay | Admitting: Nurse Practitioner

## 2021-10-10 VITALS — BP 104/76 | HR 66 | Temp 97.6°F | Resp 18 | Wt 229.4 lb

## 2021-10-10 DIAGNOSIS — N50811 Right testicular pain: Secondary | ICD-10-CM | POA: Diagnosis not present

## 2021-10-10 DIAGNOSIS — D6959 Other secondary thrombocytopenia: Secondary | ICD-10-CM | POA: Diagnosis not present

## 2021-10-10 DIAGNOSIS — I10 Essential (primary) hypertension: Secondary | ICD-10-CM | POA: Insufficient documentation

## 2021-10-10 DIAGNOSIS — Z8616 Personal history of COVID-19: Secondary | ICD-10-CM | POA: Diagnosis not present

## 2021-10-10 DIAGNOSIS — C221 Intrahepatic bile duct carcinoma: Secondary | ICD-10-CM | POA: Insufficient documentation

## 2021-10-10 DIAGNOSIS — E785 Hyperlipidemia, unspecified: Secondary | ICD-10-CM | POA: Diagnosis not present

## 2021-10-10 DIAGNOSIS — Z5189 Encounter for other specified aftercare: Secondary | ICD-10-CM | POA: Insufficient documentation

## 2021-10-10 DIAGNOSIS — R188 Other ascites: Secondary | ICD-10-CM | POA: Insufficient documentation

## 2021-10-10 DIAGNOSIS — E1142 Type 2 diabetes mellitus with diabetic polyneuropathy: Secondary | ICD-10-CM | POA: Insufficient documentation

## 2021-10-10 DIAGNOSIS — R140 Abdominal distension (gaseous): Secondary | ICD-10-CM | POA: Diagnosis not present

## 2021-10-10 DIAGNOSIS — Z5111 Encounter for antineoplastic chemotherapy: Secondary | ICD-10-CM | POA: Insufficient documentation

## 2021-10-10 LAB — URINALYSIS, COMPLETE (UACMP) WITH MICROSCOPIC
Bilirubin Urine: NEGATIVE
Glucose, UA: NEGATIVE mg/dL
Hgb urine dipstick: NEGATIVE
Leukocytes,Ua: NEGATIVE
Nitrite: NEGATIVE
Protein, ur: 30 mg/dL — AB
Specific Gravity, Urine: 1.031 — ABNORMAL HIGH (ref 1.005–1.030)
pH: 5.5 (ref 5.0–8.0)

## 2021-10-10 LAB — CMP (CANCER CENTER ONLY)
ALT: 29 U/L (ref 0–44)
AST: 121 U/L — ABNORMAL HIGH (ref 15–41)
Albumin: 2.6 g/dL — ABNORMAL LOW (ref 3.5–5.0)
Alkaline Phosphatase: 343 U/L — ABNORMAL HIGH (ref 38–126)
Anion gap: 7 (ref 5–15)
BUN: 16 mg/dL (ref 6–20)
CO2: 26 mmol/L (ref 22–32)
Calcium: 8.6 mg/dL — ABNORMAL LOW (ref 8.9–10.3)
Chloride: 98 mmol/L (ref 98–111)
Creatinine: 0.88 mg/dL (ref 0.61–1.24)
GFR, Estimated: >60 mL/min (ref >60)
Glucose, Bld: 116 mg/dL — ABNORMAL HIGH (ref 70–99)
Potassium: 4.7 mmol/L (ref 3.5–5.1)
Sodium: 131 mmol/L — ABNORMAL LOW (ref 135–145)
Total Bilirubin: 3.6 mg/dL (ref 0.3–1.2)
Total Protein: 6.5 g/dL (ref 6.5–8.1)

## 2021-10-10 LAB — CBC WITH DIFFERENTIAL (CANCER CENTER ONLY)
Abs Immature Granulocytes: 0 10*3/uL (ref 0.00–0.07)
Basophils Absolute: 0 10*3/uL (ref 0.0–0.1)
Basophils Relative: 1 %
Eosinophils Absolute: 0.2 10*3/uL (ref 0.0–0.5)
Eosinophils Relative: 5 %
HCT: 40.5 % (ref 39.0–52.0)
Hemoglobin: 14 g/dL (ref 13.0–17.0)
Immature Granulocytes: 0 %
Lymphocytes Relative: 30 %
Lymphs Abs: 0.9 10*3/uL (ref 0.7–4.0)
MCH: 33.4 pg (ref 26.0–34.0)
MCHC: 34.6 g/dL (ref 30.0–36.0)
MCV: 96.7 fL (ref 80.0–100.0)
Monocytes Absolute: 0.7 10*3/uL (ref 0.1–1.0)
Monocytes Relative: 25 %
Neutro Abs: 1.2 10*3/uL — ABNORMAL LOW (ref 1.7–7.7)
Neutrophils Relative %: 39 %
Platelet Count: 164 10*3/uL (ref 150–400)
RBC: 4.19 MIL/uL — ABNORMAL LOW (ref 4.22–5.81)
RDW: 16.1 % — ABNORMAL HIGH (ref 11.5–15.5)
WBC Count: 3 10*3/uL — ABNORMAL LOW (ref 4.0–10.5)
nRBC: 0 % (ref 0.0–0.2)

## 2021-10-10 MED ORDER — SODIUM CHLORIDE 0.9% FLUSH
10.0000 mL | Freq: Once | INTRAVENOUS | Status: AC
  Administered 2021-10-10: 10 mL via INTRAVENOUS

## 2021-10-10 MED ORDER — HEPARIN SOD (PORK) LOCK FLUSH 100 UNIT/ML IV SOLN
500.0000 [IU] | Freq: Once | INTRAVENOUS | Status: AC
  Administered 2021-10-10: 500 [IU] via INTRAVENOUS

## 2021-10-10 MED ORDER — SPIRONOLACTONE 25 MG PO TABS
25.0000 mg | ORAL_TABLET | Freq: Every day | ORAL | 1 refills | Status: DC
  Filled 2021-10-10: qty 30, 30d supply, fill #0
  Filled 2021-11-05: qty 30, 30d supply, fill #1

## 2021-10-10 NOTE — Progress Notes (Signed)
[facility] OFFICE PROGRESS NOTE   Diagnosis: Cholangiocarcinoma  INTERVAL HISTORY:   Mr. Gaxiola returns as scheduled.  He completed cycle 1 FOLFIRI 09/24/2021.  He underwent another paracentesis procedure 10/03/2021 with 5-1/2 L of clear yellow fluid removed.  He feels he needs another paracentesis procedure.  He notes improvement in abdominal distention lasting about 2 days after each procedure.  He felt poorly for 4 to 5 days after the chemotherapy.  Intermittent nausea/vomiting for 2 to 3 days beginning on day 2.  Compazine helped.  No mouth sores.  No diarrhea.  A few days ago he noted decrease in urination.  Last night right testicle pain.  Initially had right low back pain.  Today no back pain.  No leg weakness or numbness.  No incontinence.  No dysuria.  No fever.  Objective:  Vital signs in last 24 hours:  Blood pressure 104/76, pulse 66, temperature 97.6 F (36.4 C), temperature source Temporal, resp. rate 18, weight 229 lb 6.4 oz (104.1 kg), SpO2 99 %.    HEENT: No thrush or ulcers. Resp: Lungs clear bilaterally. Cardio: Regular rate and rhythm. GI: Abdomen is distended consistent with ascites.  Tender at the right upper abdomen. Vascular: No leg edema. Neuro: Lower extremity motor strength intact Musculoskeletal: Nontender over the right flank region. GU: No testicular tenderness or edema. Skin: Palms without erythema. Port-A-Cath without erythema.   Lab Results:  Lab Results  Component Value Date   WBC 3.0 (L) 10/10/2021   HGB 14.0 10/10/2021   HCT 40.5 10/10/2021   MCV 96.7 10/10/2021   PLT 164 10/10/2021   NEUTROABS 1.2 (L) 10/10/2021    Imaging:  No results found.  Medications: I have reviewed the patient's current medications.  Assessment/Plan: Cholangiocarcinoma  multiple liver masses and abdominal lymphadenopathy CTs 01/13/2020-rounded hypodense mass appears to arise from the pancreas neck, multiple rim-enhancing masses in the liver,  primarily left liver with segmental dilation of the left lobe Intermatic bile ducts, ill-defined hypodensity the central liver with effacement of the left portal vein, enlarged portacaval and retroperitoneal lymph nodes Ultrasound-guided biopsy of the left liver lesion 01/19/2020-adenocarcinoma, cytokeratin 7+, MSS, tumor mutation burden 1, IDH1 R132C MRI abdomen 01/31/2020-poorly marginated central liver mass, multiple smaller similar satellite liver masses, extrinsic mass-effect at the biliary hilum with intrahepatic biliary ductal dilatation throughout the left liver with mild Intermatic dilatation the superior right liver, no pancreas mass or ductal dilatation, normal spleen size, numerous enlarged enhancing lymph nodes at the porta hepatis, peripancreatic, portacaval, aortocaval, left periaortic chains Cycle 1 gemcitabine/cisplatin 02/04/2020 Cycle 2 gemcitabine/cisplatin 02/28/2020 Cycle 3 gemcitabine/cisplatin 03/20/2020 CT abdomen/pelvis 03/31/2020-mild decrease in central liver tumor, mild decrease in size of liver metastases and upper abdominal adenopathy, new splenomegaly Cycle 4 gemcitabine/cisplatin 04/10/2020 Cycle 5 gemcitabine/cisplatin 05/01/2020 Cycle 6 gemcitabine/cisplatin plus durvalumab 05/22/2020 CTs 06/06/2020- dominant central liver mass mildly enlarged, other liver lesions and abdominal adenopathy is stable, no evidence of metastatic disease to the chest Cycle 7 gemcitabine/cisplatin plus Durvalumab 06/12/2020 Cycle 8 gemcitabine/cisplatin plus Durvalumab 06/30/2020 Cycle 9 gemcitabine/cisplatin plus Durvalumab 07/31/2020 CT abdomen/pelvis 08/20/2020-decreased size of dominant central liver mass, slight increase in size and stable additional liver lesions, stable portacaval node Cycle 10 gemcitabine/cisplatin plus Durvalumab 08/21/2020 Cycle 11 gemcitabine/cisplatin plus Durvalumab 09/11/2020 Cycle 12 gemcitabine/cisplatin plus Durvalumab 10/02/2020 Cycle 13 gemcitabine/cisplatin plus  Durvalumab 10/23/2020 CT abdomen/pelvis 11/10/2020-no change in dominant central liver mass and additional liver lesions, stable upper retroperitoneal adenopathy Cycle 14 gemcitabine/cisplatin plus Durvalumab 11/13/2020 Cycle 15 gemcitabine/cisplatin plus Durvalumab 12/04/2020 Cycle  15 gemcitabine/cisplatin plus Durvalumab 01/08/2021 Cycle 16 gemcitabine/cisplatin plus Durvalumab 01/30/2021-cisplatin held from day 1 and day 8 secondary to neuropathy CTs 02/15/2021-slight increase in size of central liver mass, progressive metastatic lesions in the right liver, stable to slightly decreased periportal lymph nodes, progression of splenomegaly, chronically thrombosed left portal vein Cycle 1 FOLFOX 03/05/2021 Cycle 2 FOLFOX 03/19/2021, oxaliplatin dose reduced due to thrombocytopenia Cycle 3 FOLFOX 04/02/2021 Cycle 4 FOLFOX 04/16/2021 Cycle 5 FOLFOX 04/30/2021 CTs 05/10/2021-new and enlarging liver metastases, chronic left lobe biliary dilatation, chronic left portal vein thrombosis, stable porta hepatis adenopathy, splenomegaly, no evidence of metastatic disease to the chest Ivosidenib 05/21/2021 CT 07/13/2021-increase in size of periportal node, stable retroperitoneal nodes, dominant left hepatic mass-stable, increased necrosis of other liver lesions, some have increased in size Ivosidenib continued CT abdomen/pelvis 09/06/2021-progression of ascites, progression of liver metastases, stable necrotic lymphadenopathy in the periportal region Ivosedinib discontinued 09/07/2021 Cycle 1 FOLFIRI 09/24/2021 Cycle 2 held on 10/10/2021 due to neutropenia, elevated bilirubin   Cough-likely related to diaphragmatic irritation from #1 Anorexia/weight loss Hypercalcemia-likely hypercalcemia malignancy, status post intravenous hydration and Zometa 01/14/2020 Diabetes Hyperlipidemia Hypertension History of peripheral neuropathy secondary to diabetes Severe back pain following Fulphila-oxycodone prescribed Thrombocytopenia  secondary to chemotherapy-gemcitabine held with day 1 cycle 8 and then dose reduced COVID-19 infection 07/22/2020, 12/25/2020 Cisplatin induced peripheral neuropathy  Disposition: [name] appears unchanged.  He has completed 1 cycle of FOLFIRI.  Review of the CBC from today shows neutropenia.  We are holding today's treatment with the plan to add white cell growth factor support with cycle 2.  He is going to be out of town on vacation next week.  He will return for cycle 2 FOLFIRI 10/22/2021.  Bilirubin is higher than 2 weeks ago.  We will continue to monitor.  Recent difficulty with urination.  He will submit a urine specimen today.  He understands to contact the office with inability to pass urine, leg weakness/numbness.  He is scheduled for a paracentesis 10/12/2021.  Plan for administration of albumin.  He will begin Aldactone 25 mg daily to see if this will help with ascites.  He will return for lab, follow-up, FOLFIRI on 10/22/2021.  He will contact the office in the interim as outlined above or with any other problems.  Patient seen with Dr. Benay Spice.  Ned Card ANP/GNP-BC   10/10/2021  11:31 AM This was a shared visit with Ned Card.  Mr. Urwin was interviewed and examined.  He is here for cycle 2 FOLFIRI, but the neutrophil count is low.  He is leaving town on vacation next week.  Chemotherapy will be held today.  We will request approval for G-CSF with cycle 2.  The etiology of the urinary symptoms is unclear.  I have a low clinical suspicion for a cord or cauda equina syndrome.  We will check a urinalysis.  He continues to undergo intermittent paracentesis procedures for relief of ascites.  He will begin a trial of Aldactone.  I was present for greater than 50% of today's visit.  I performed medical decision making.  Julieanne Manson, MD

## 2021-10-10 NOTE — Progress Notes (Unsigned)
Patient seen by Ned Card NP today  Vitals are not all within treatment parameters. Treatment held  Labs reviewed by Ned Card NP and are not all within treatment parameters. Treatment held and will add Udenyca next cycle  Per physician team, patient will not be receiving treatment today.

## 2021-10-10 NOTE — Progress Notes (Signed)
CRITICAL VALUE STICKER  CRITICAL VALUE: Total Bili 3.6  RECEIVER (on-site recipient of call): Vahe Pienta Rodgres  DATE & TIME NOTIFIED: 10/10/2021 at 1130  MESSENGER (representative from lab):   MD NOTIFIED: 10/10/2021   TIME OF NOTIFICATION: 3570  RESPONSE:  APP went in to see patients

## 2021-10-11 ENCOUNTER — Other Ambulatory Visit: Payer: Self-pay

## 2021-10-11 LAB — URINE CULTURE: Culture: NO GROWTH

## 2021-10-12 ENCOUNTER — Inpatient Hospital Stay: Payer: No Typology Code available for payment source

## 2021-10-12 ENCOUNTER — Ambulatory Visit (HOSPITAL_COMMUNITY)
Admission: RE | Admit: 2021-10-12 | Discharge: 2021-10-12 | Disposition: A | Discharge status: Home / Self Care | Payer: No Typology Code available for payment source | Source: Ambulatory Visit | Attending: Oncology | Admitting: Oncology

## 2021-10-12 DIAGNOSIS — R188 Other ascites: Secondary | ICD-10-CM | POA: Diagnosis not present

## 2021-10-12 DIAGNOSIS — C221 Intrahepatic bile duct carcinoma: Secondary | ICD-10-CM | POA: Diagnosis present

## 2021-10-12 HISTORY — PX: IR PARACENTESIS: IMG2679

## 2021-10-12 MED ORDER — ALBUMIN HUMAN 25 % IV SOLN: 25.0000 g | Freq: Once | INTRAVENOUS | Status: AC

## 2021-10-12 MED ORDER — LIDOCAINE HCL 1 % IJ SOLN
INTRAMUSCULAR | Status: DC | PRN
  Administered 2021-10-12: 10 mL via INTRADERMAL

## 2021-10-12 MED ORDER — ALBUMIN HUMAN 25 % IV SOLN
INTRAVENOUS | Status: AC
  Administered 2021-10-12: 25 g via INTRAVENOUS
  Filled 2021-10-12: qty 100

## 2021-10-12 MED ORDER — LIDOCAINE HCL 1 % IJ SOLN
INTRAMUSCULAR | Status: AC
  Filled 2021-10-12: qty 20

## 2021-10-15 ENCOUNTER — Other Ambulatory Visit: Payer: Self-pay | Admitting: *Deleted

## 2021-10-15 DIAGNOSIS — C221 Intrahepatic bile duct carcinoma: Secondary | ICD-10-CM

## 2021-10-15 NOTE — Progress Notes (Signed)
Lab orders in

## 2021-10-18 ENCOUNTER — Other Ambulatory Visit (HOSPITAL_BASED_OUTPATIENT_CLINIC_OR_DEPARTMENT_OTHER): Payer: Self-pay

## 2021-10-18 ENCOUNTER — Other Ambulatory Visit: Payer: Self-pay | Admitting: Family Medicine

## 2021-10-18 ENCOUNTER — Encounter: Payer: Self-pay | Admitting: *Deleted

## 2021-10-18 MED ORDER — BISOPROLOL-HYDROCHLOROTHIAZIDE 10-6.25 MG PO TABS
1.0000 | ORAL_TABLET | Freq: Every morning | ORAL | 3 refills | Status: DC
  Filled 2021-10-18: qty 90, 90d supply, fill #0

## 2021-10-18 NOTE — Progress Notes (Signed)
Faxed 9/6 office note, labs and paracentesis report to [facility] nurse case manager [phone]

## 2021-10-19 ENCOUNTER — Ambulatory Visit (HOSPITAL_COMMUNITY)
Admission: RE | Admit: 2021-10-19 | Discharge: 2021-10-19 | Disposition: A | Discharge status: Home / Self Care | Payer: No Typology Code available for payment source | Source: Ambulatory Visit | Attending: Oncology | Admitting: Oncology

## 2021-10-19 DIAGNOSIS — C221 Intrahepatic bile duct carcinoma: Secondary | ICD-10-CM | POA: Insufficient documentation

## 2021-10-19 HISTORY — PX: IR PARACENTESIS: IMG2679

## 2021-10-19 MED ORDER — LIDOCAINE HCL 1 % IJ SOLN
INTRAMUSCULAR | Status: AC
  Filled 2021-10-19: qty 20

## 2021-10-19 NOTE — Procedures (Signed)
Ultrasound-guided diagnostic and therapeutic paracentesis performed yielding 4 liters of straw colored fluid.  No immediate complications. EBL is none.

## 2021-10-22 ENCOUNTER — Inpatient Hospital Stay: Payer: No Typology Code available for payment source

## 2021-10-22 ENCOUNTER — Encounter: Payer: Self-pay | Admitting: Nurse Practitioner

## 2021-10-22 ENCOUNTER — Other Ambulatory Visit (HOSPITAL_BASED_OUTPATIENT_CLINIC_OR_DEPARTMENT_OTHER): Payer: Self-pay

## 2021-10-22 ENCOUNTER — Inpatient Hospital Stay (HOSPITAL_BASED_OUTPATIENT_CLINIC_OR_DEPARTMENT_OTHER): Payer: No Typology Code available for payment source | Admitting: Nurse Practitioner

## 2021-10-22 ENCOUNTER — Other Ambulatory Visit: Payer: Self-pay

## 2021-10-22 ENCOUNTER — Telehealth: Payer: Self-pay

## 2021-10-22 VITALS — BP 105/67 | HR 53

## 2021-10-22 VITALS — BP 113/75 | HR 63 | Temp 98.2°F | Resp 18 | Ht 73.0 in | Wt 211.0 lb

## 2021-10-22 DIAGNOSIS — Z5111 Encounter for antineoplastic chemotherapy: Secondary | ICD-10-CM | POA: Diagnosis not present

## 2021-10-22 DIAGNOSIS — C221 Intrahepatic bile duct carcinoma: Secondary | ICD-10-CM

## 2021-10-22 DIAGNOSIS — C250 Malignant neoplasm of head of pancreas: Secondary | ICD-10-CM

## 2021-10-22 LAB — CBC WITH DIFFERENTIAL (CANCER CENTER ONLY)
Abs Immature Granulocytes: 0.04 10*3/uL (ref 0.00–0.07)
Basophils Absolute: 0.1 10*3/uL (ref 0.0–0.1)
Basophils Relative: 1 %
Eosinophils Absolute: 0.2 10*3/uL (ref 0.0–0.5)
Eosinophils Relative: 2 %
HCT: 43.5 % (ref 39.0–52.0)
Hemoglobin: 14.7 g/dL (ref 13.0–17.0)
Immature Granulocytes: 0 %
Lymphocytes Relative: 17 %
Lymphs Abs: 1.5 10*3/uL (ref 0.7–4.0)
MCH: 32.7 pg (ref 26.0–34.0)
MCHC: 33.8 g/dL (ref 30.0–36.0)
MCV: 96.9 fL (ref 80.0–100.0)
Monocytes Absolute: 1 10*3/uL (ref 0.1–1.0)
Monocytes Relative: 11 %
Neutro Abs: 6.4 10*3/uL (ref 1.7–7.7)
Neutrophils Relative %: 69 %
Platelet Count: 167 10*3/uL (ref 150–400)
RBC: 4.49 MIL/uL (ref 4.22–5.81)
RDW: 16.1 % — ABNORMAL HIGH (ref 11.5–15.5)
WBC Count: 9.1 10*3/uL (ref 4.0–10.5)
nRBC: 0 % (ref 0.0–0.2)

## 2021-10-22 LAB — CMP (CANCER CENTER ONLY)
ALT: 33 U/L (ref 0–44)
AST: 139 U/L — ABNORMAL HIGH (ref 15–41)
Albumin: 2.5 g/dL — ABNORMAL LOW (ref 3.5–5.0)
Alkaline Phosphatase: 397 U/L — ABNORMAL HIGH (ref 38–126)
Anion gap: 6 (ref 5–15)
BUN: 23 mg/dL — ABNORMAL HIGH (ref 6–20)
CO2: 27 mmol/L (ref 22–32)
Calcium: 9.3 mg/dL (ref 8.9–10.3)
Chloride: 96 mmol/L — ABNORMAL LOW (ref 98–111)
Creatinine: 0.89 mg/dL (ref 0.61–1.24)
GFR, Estimated: >60 mL/min (ref >60)
Glucose, Bld: 114 mg/dL — ABNORMAL HIGH (ref 70–99)
Potassium: 3.7 mmol/L (ref 3.5–5.1)
Sodium: 129 mmol/L — ABNORMAL LOW (ref 135–145)
Total Bilirubin: 2.8 mg/dL — ABNORMAL HIGH (ref 0.3–1.2)
Total Protein: 6.5 g/dL (ref 6.5–8.1)

## 2021-10-22 MED ORDER — OXYCODONE HCL 5 MG PO TABS
ORAL_TABLET | Freq: Four times a day (QID) | ORAL | 0 refills | Status: DC | PRN
  Filled 2021-10-22: qty 10, 3d supply, fill #0

## 2021-10-22 MED ORDER — SODIUM CHLORIDE 0.9 % IV SOLN
180.0000 mg/m2 | Freq: Once | INTRAVENOUS | Status: AC
  Administered 2021-10-22: 400 mg via INTRAVENOUS
  Filled 2021-10-22: qty 20

## 2021-10-22 MED ORDER — PALONOSETRON HCL INJECTION 0.25 MG/5ML
0.2500 mg | Freq: Once | INTRAVENOUS | Status: AC
  Administered 2021-10-22: 0.25 mg via INTRAVENOUS
  Filled 2021-10-22: qty 5

## 2021-10-22 MED ORDER — FLUOROURACIL CHEMO INJECTION 500 MG/10ML
180.0000 mg/m2 | Freq: Once | INTRAVENOUS | Status: AC
  Administered 2021-10-22: 400 mg via INTRAVENOUS
  Filled 2021-10-22: qty 8

## 2021-10-22 MED ORDER — SODIUM CHLORIDE 0.9 % IV SOLN
1600.0000 mg/m2 | INTRAVENOUS | Status: DC
  Administered 2021-10-22: 3550 mg via INTRAVENOUS
  Filled 2021-10-22: qty 71

## 2021-10-22 MED ORDER — SODIUM CHLORIDE 0.9 % IV SOLN
80.0000 mg/m2 | Freq: Once | INTRAVENOUS | Status: AC
  Administered 2021-10-22: 180 mg via INTRAVENOUS
  Filled 2021-10-22: qty 5

## 2021-10-22 MED ORDER — SODIUM CHLORIDE 0.9 % IV SOLN: Freq: Once | INTRAVENOUS | Status: AC

## 2021-10-22 MED ORDER — ATROPINE SULFATE 1 MG/ML IV SOLN
0.5000 mg | Freq: Once | INTRAVENOUS | Status: AC | PRN
  Administered 2021-10-22: 0.5 mg via INTRAVENOUS
  Filled 2021-10-22: qty 1

## 2021-10-22 MED ORDER — DEXAMETHASONE SODIUM PHOSPHATE 10 MG/ML IJ SOLN
5.0000 mg | Freq: Once | INTRAMUSCULAR | Status: AC
  Administered 2021-10-22: 5 mg via INTRAVENOUS
  Filled 2021-10-22: qty 1

## 2021-10-22 NOTE — Telephone Encounter (Signed)
Standing order was placed for IR paracentesis weekly for 6 weeks.

## 2021-10-22 NOTE — Patient Instructions (Signed)
[place]   Discharge Instructions: Thank you for choosing [facility] to provide your oncology and hematology care.   If you have a lab appointment with the [facility], please go directly to the [facility] and check in at the registration area.   Wear comfortable clothing and clothing appropriate for easy access to any Portacath or PICC line.   We strive to give you quality time with your provider. You may need to reschedule your appointment if you arrive late (15 or more minutes).  Arriving late affects you and other patients whose appointments are after yours.  Also, if you miss three or more appointments without notifying the office, you may be dismissed from the clinic at the provider's discretion.      For prescription refill requests, have your pharmacy contact our office and allow 72 hours for refills to be completed.    Today you received the following chemotherapy and/or immunotherapy agents Irinotecan (CAMPTOSAR), Leucovorin & Flourouracil (ADRUCIL).      To help prevent nausea and vomiting after your treatment, we encourage you to take your nausea medication as directed.  BELOW ARE SYMPTOMS THAT SHOULD BE REPORTED IMMEDIATELY: *FEVER GREATER THAN 100.4 F (38 C) OR HIGHER *CHILLS OR SWEATING *NAUSEA AND VOMITING THAT IS NOT CONTROLLED WITH YOUR NAUSEA MEDICATION *UNUSUAL SHORTNESS OF BREATH *UNUSUAL BRUISING OR BLEEDING *URINARY PROBLEMS (pain or burning when urinating, or frequent urination) *BOWEL PROBLEMS (unusual diarrhea, constipation, pain near the anus) TENDERNESS IN MOUTH AND THROAT WITH OR WITHOUT PRESENCE OF ULCERS (sore throat, sores in mouth, or a toothache) UNUSUAL RASH, SWELLING OR PAIN  UNUSUAL VAGINAL DISCHARGE OR ITCHING   Items with * indicate a potential emergency and should be followed up as soon as possible or go to the Emergency Department if any problems should occur.  Please show the CHEMOTHERAPY ALERT  CARD or IMMUNOTHERAPY ALERT CARD at check-in to the Emergency Department and triage nurse.  Should you have questions after your visit or need to cancel or reschedule your appointment, please contact [facility]  Dept: [phone]  and follow the prompts.  Office hours are 8:00 a.m. to 4:30 p.m. Monday - Friday. Please note that voicemails left after 4:00 p.m. may not be returned until the following business day.  We are closed weekends and major holidays. You have access to a nurse at all times for urgent questions. Please call the main number to the clinic Dept: [phone] and follow the prompts.   For any non-urgent questions, you may also contact your provider using MyChart. We now offer e-Visits for anyone 35 and older to request care online for non-urgent symptoms. For details visit [email or web address].   Also download the MyChart app! Go to the app store, search "MyChart", open the app, select [facility], and log in with your MyChart username and password.  Masks are optional in the cancer centers. If you would like for your care team to wear a mask while they are taking care of you, please let them know. You may have one support person who is at least 46 years old accompany you for your appointments.  Irinotecan Injection What is this medication? IRINOTECAN (ir in oh TEE kan) treats some types of cancer. It works by slowing down the growth of cancer cells. This medicine may be used for other purposes; ask your health care provider or pharmacist if you have questions. COMMON BRAND NAME(S): Camptosar What should I tell my  care team before I take this medication? They need to know if you have any of these conditions: Dehydration Diarrhea Infection, especially a viral infection, such as chickenpox, cold sores, herpes Liver disease Low blood cell levels (white cells, red cells, and platelets) Low levels of electrolytes, such as calcium, magnesium, or  potassium in your blood Recent or ongoing radiation An unusual or allergic reaction to irinotecan, other medications, foods, dyes, or preservatives If you or your partner are pregnant or trying to get pregnant Breast-feeding How should I use this medication? This medication is injected into a vein. It is given by your care team in a hospital or clinic setting. Talk to your care team about the use of this medication in children. Special care may be needed. Overdosage: If you think you have taken too much of this medicine contact a poison control center or emergency room at once. NOTE: This medicine is only for you. Do not share this medicine with others. What if I miss a dose? Keep appointments for follow-up doses. It is important not to miss your dose. Call your care team if you are unable to keep an appointment. What may interact with this medication? Do not take this medication with any of the following: Cobicistat Itraconazole This medication may also interact with the following: Certain antibiotics, such as clarithromycin, rifampin, rifabutin Certain antivirals for HIV or AIDS Certain medications for fungal infections, such as ketoconazole, posaconazole, voriconazole Certain medications for seizures, such as carbamazepine, phenobarbital, phenytoin Gemfibrozil Nefazodone St. John's wort This list may not describe all possible interactions. Give your health care provider a list of all the medicines, herbs, non-prescription drugs, or dietary supplements you use. Also tell them if you smoke, drink alcohol, or use illegal drugs. Some items may interact with your medicine. What should I watch for while using this medication? Your condition will be monitored carefully while you are receiving this medication. You may need blood work while taking this medication. This medication may make you feel generally unwell. This is not uncommon as chemotherapy can affect healthy cells as well as cancer  cells. Report any side effects. Continue your course of treatment even though you feel ill unless your care team tells you to stop. This medication can cause serious side effects. To reduce the risk, your care team may give you other medications to take before receiving this one. Be sure to follow the directions from your care team. This medication may affect your coordination, reaction time, or judgement. Do not drive or operate machinery until you know how this medication affects you. Sit up or stand slowly to reduce the risk of dizzy or fainting spells. Drinking alcohol with this medication can increase the risk of these side effects. This medication may increase your risk of getting an infection. Call your care team for advice if you get a fever, chills, sore throat, or other symptoms of a cold or flu. Do not treat yourself. Try to avoid being around people who are sick. Avoid taking medications that contain aspirin, acetaminophen, ibuprofen, naproxen, or ketoprofen unless instructed by your care team. These medications may hide a fever. This medication may increase your risk to bruise or bleed. Call your care team if you notice any unusual bleeding. Be careful brushing or flossing your teeth or using a toothpick because you may get an infection or bleed more easily. If you have any dental work done, tell your dentist you are receiving this medication. Talk to your care team if you  or your partner are pregnant or think either of you might be pregnant. This medication can cause serious birth defects if taken during pregnancy and for 6 months after the last dose. You will need a negative pregnancy test before starting this medication. Contraception is recommended while taking this medication and for 6 months after the last dose. Your care team can help you find the option that works for you. Do not father a child while taking this medication and for 3 months after the last dose. Use a condom for  contraception during this time period. Do not breastfeed while taking this medication and for 7 days after the last dose. This medication may cause infertility. Talk to your care team if you are concerned about your fertility. What side effects may I notice from receiving this medication? Side effects that you should report to your care team as soon as possible: Allergic reactions--skin rash, itching, hives, swelling of the face, lips, tongue, or throat Dry cough, shortness of breath or trouble breathing Increased saliva or tears, increased sweating, stomach cramping, diarrhea, small pupils, unusual weakness or fatigue, slow heartbeat Infection--fever, chills, cough, sore throat, wounds that don't heal, pain or trouble when passing urine, general feeling of discomfort or being unwell Kidney injury--decrease in the amount of urine, swelling of the ankles, hands, or feet Low red blood cell level--unusual weakness or fatigue, dizziness, headache, trouble breathing Severe or prolonged diarrhea Unusual bruising or bleeding Side effects that usually do not require medical attention (report to your care team if they continue or are bothersome): Constipation Diarrhea Hair loss Loss of appetite Nausea Stomach pain This list may not describe all possible side effects. Call your doctor for medical advice about side effects. You may report side effects to FDA at 1-800-FDA-1088. Where should I keep my medication? This medication is given in a hospital or clinic. It will not be stored at home. NOTE: This sheet is a summary. It may not cover all possible information. If you have questions about this medicine, talk to your doctor, pharmacist, or health care provider.  2023 Elsevier/Gold Standard (2021-05-31 00:00:00)  Leucovorin Injection What is this medication? LEUCOVORIN (loo koe VOR in) prevents side effects from certain medications, such as methotrexate. It works by increasing folate levels. This  helps protect healthy cells in your body. It may also be used to treat anemia caused by low levels of folate. It can also be used with fluorouracil, a type of chemotherapy, to treat colorectal cancer. It works by increasing the effects of fluorouracil in the body. This medicine may be used for other purposes; ask your health care provider or pharmacist if you have questions. What should I tell my care team before I take this medication? They need to know if you have any of these conditions: Anemia from low levels of vitamin B12 in the blood An unusual or allergic reaction to leucovorin, folic acid, other medications, foods, dyes, or preservatives Pregnant or trying to get pregnant Breastfeeding How should I use this medication? This medication is injected into a vein or a muscle. It is given by your care team in a hospital or clinic setting. Talk to your care team about the use of this medication in children. Special care may be needed. Overdosage: If you think you have taken too much of this medicine contact a poison control center or emergency room at once. NOTE: This medicine is only for you. Do not share this medicine with others. What if I miss a  dose? Keep appointments for follow-up doses. It is important not to miss your dose. Call your care team if you are unable to keep an appointment. What may interact with this medication? Capecitabine Fluorouracil Phenobarbital Phenytoin Primidone Trimethoprim;sulfamethoxazole This list may not describe all possible interactions. Give your health care provider a list of all the medicines, herbs, non-prescription drugs, or dietary supplements you use. Also tell them if you smoke, drink alcohol, or use illegal drugs. Some items may interact with your medicine. What should I watch for while using this medication? Your condition will be monitored carefully while you are receiving this medication. This medication may increase the side effects of  5-fluorouracil. Tell your care team if you have diarrhea or mouth sores that do not get better or that get worse. What side effects may I notice from receiving this medication? Side effects that you should report to your care team as soon as possible: Allergic reactions--skin rash, itching, hives, swelling of the face, lips, tongue, or throat This list may not describe all possible side effects. Call your doctor for medical advice about side effects. You may report side effects to FDA at 1-800-FDA-1088. Where should I keep my medication? This medication is given in a hospital or clinic. It will not be stored at home. NOTE: This sheet is a summary. It may not cover all possible information. If you have questions about this medicine, talk to your doctor, pharmacist, or health care provider.  2023 Elsevier/Gold Standard (2021-06-01 00:00:00)  Fluorouracil Injection What is this medication? FLUOROURACIL (flure oh YOOR a sil) treats some types of cancer. It works by slowing down the growth of cancer cells. This medicine may be used for other purposes; ask your health care provider or pharmacist if you have questions. COMMON BRAND NAME(S): Adrucil What should I tell my care team before I take this medication? They need to know if you have any of these conditions: Blood disorders Dihydropyrimidine dehydrogenase (DPD) deficiency Infection, such as chickenpox, cold sores, herpes Kidney disease Liver disease Poor nutrition Recent or ongoing radiation therapy An unusual or allergic reaction to fluorouracil, other medications, foods, dyes, or preservatives If you or your partner are pregnant or trying to get pregnant Breast-feeding How should I use this medication? This medication is injected into a vein. It is administered by your care team in a hospital or clinic setting. Talk to your care team about the use of this medication in children. Special care may be needed. Overdosage: If you think you  have taken too much of this medicine contact a poison control center or emergency room at once. NOTE: This medicine is only for you. Do not share this medicine with others. What if I miss a dose? Keep appointments for follow-up doses. It is important not to miss your dose. Call your care team if you are unable to keep an appointment. What may interact with this medication? Do not take this medication with any of the following: Live virus vaccines This medication may also interact with the following: Medications that treat or prevent blood clots, such as warfarin, enoxaparin, dalteparin This list may not describe all possible interactions. Give your health care provider a list of all the medicines, herbs, non-prescription drugs, or dietary supplements you use. Also tell them if you smoke, drink alcohol, or use illegal drugs. Some items may interact with your medicine. What should I watch for while using this medication? Your condition will be monitored carefully while you are receiving this medication. This medication  may make you feel generally unwell. This is not uncommon as chemotherapy can affect healthy cells as well as cancer cells. Report any side effects. Continue your course of treatment even though you feel ill unless your care team tells you to stop. In some cases, you may be given additional medications to help with side effects. Follow all directions for their use. This medication may increase your risk of getting an infection. Call your care team for advice if you get a fever, chills, sore throat, or other symptoms of a cold or flu. Do not treat yourself. Try to avoid being around people who are sick. This medication may increase your risk to bruise or bleed. Call your care team if you notice any unusual bleeding. Be careful brushing or flossing your teeth or using a toothpick because you may get an infection or bleed more easily. If you have any dental work done, tell your dentist you  are receiving this medication. Avoid taking medications that contain aspirin, acetaminophen, ibuprofen, naproxen, or ketoprofen unless instructed by your care team. These medications may hide a fever. Do not treat diarrhea with over the counter products. Contact your care team if you have diarrhea that lasts more than 2 days or if it is severe and watery. This medication can make you more sensitive to the sun. Keep out of the sun. If you cannot avoid being in the sun, wear protective clothing and sunscreen. Do not use sun lamps, tanning beds, or tanning booths. Talk to your care team if you or your partner wish to become pregnant or think you might be pregnant. This medication can cause serious birth defects if taken during pregnancy and for 3 months after the last dose. A reliable form of contraception is recommended while taking this medication and for 3 months after the last dose. Talk to your care team about effective forms of contraception. Do not father a child while taking this medication and for 3 months after the last dose. Use a condom while having sex during this time period. Do not breastfeed while taking this medication. This medication may cause infertility. Talk to your care team if you are concerned about your fertility. What side effects may I notice from receiving this medication? Side effects that you should report to your care team as soon as possible: Allergic reactions--skin rash, itching, hives, swelling of the face, lips, tongue, or throat Heart attack--pain or tightness in the chest, shoulders, arms, or jaw, nausea, shortness of breath, cold or clammy skin, feeling faint or lightheaded Heart failure--shortness of breath, swelling of the ankles, feet, or hands, sudden weight gain, unusual weakness or fatigue Heart rhythm changes--fast or irregular heartbeat, dizziness, feeling faint or lightheaded, chest pain, trouble breathing High ammonia level--unusual weakness or fatigue,  confusion, loss of appetite, nausea, vomiting, seizures Infection--fever, chills, cough, sore throat, wounds that don't heal, pain or trouble when passing urine, general feeling of discomfort or being unwell Low red blood cell level--unusual weakness or fatigue, dizziness, headache, trouble breathing Pain, tingling, or numbness in the hands or feet, muscle weakness, change in vision, confusion or trouble speaking, loss of balance or coordination, trouble walking, seizures Redness, swelling, and blistering of the skin over hands and feet Severe or prolonged diarrhea Unusual bruising or bleeding Side effects that usually do not require medical attention (report to your care team if they continue or are bothersome): Dry skin Headache Increased tears Nausea Pain, redness, or swelling with sores inside the mouth or throat Sensitivity to  light Vomiting This list may not describe all possible side effects. Call your doctor for medical advice about side effects. You may report side effects to FDA at 1-800-FDA-1088. Where should I keep my medication? This medication is given in a hospital or clinic. It will not be stored at home. NOTE: This sheet is a summary. It may not cover all possible information. If you have questions about this medicine, talk to your doctor, pharmacist, or health care provider.  2023 Elsevier/Gold Standard (2021-05-29 00:00:00)  The chemotherapy medication bag should finish at 46 hours, 96 hours, or 7 days. For example, if your pump is scheduled for 46 hours and it was put on at 4:00 p.m., it should finish at 2:00 p.m. the day it is scheduled to come off regardless of your appointment time.     Estimated time to finish at 10:30 a.m. on Wednesday 10/24/2021.   If the display on your pump reads "Low Volume" and it is beeping, take the batteries out of the pump and come to the cancer center for it to be taken off.   If the pump alarms go off prior to the pump reading "Low  Volume" then call [phone] and someone can assist you.  If the plunger comes out and the chemotherapy medication is leaking out, please use your home chemo spill kit to clean up the spill. Do NOT use paper towels or other household products.  If you have problems or questions regarding your pump, please call either [phone] (24 hours a day) or the cancer center Monday-Friday 8:00 a.m.- 4:30 p.m. at the clinic number and we will assist you. If you are unable to get assistance, then go to the nearest Emergency Department and ask the staff to contact the IV team for assistance.

## 2021-10-22 NOTE — Progress Notes (Signed)
[place] OFFICE PROGRESS NOTE   Diagnosis: Cholangiocarcinoma  INTERVAL HISTORY:   [name] returns as scheduled.  Cycle 2 FOLFIRI was held on 10/10/2021 due to neutropenia and elevated bilirubin.  He had a paracentesis 10/19/2021.  His wife reports 5 L of fluid removed.  She notes that he eats much better after the fluid is removed.  Otherwise appetite is poor.  He continues to have constipation.  Cough is better.  Objective:  Vital signs in last 24 hours:  Blood pressure 113/75, pulse 63, temperature 98.2 F (36.8 C), temperature source Oral, resp. rate 18, height _0  (1.854 m), weight 211 lb (95.7 kg), SpO2 99 %.    HEENT: No thrush or ulcers.  Sclera anicteric. Resp: Lungs clear bilaterally. Cardio: Regular rate and rhythm. GI: Abdomen is soft.  He does not appear to have significant ascites at present. Vascular: No leg edema. Skin: No rash. Portacath without erythema.  Lab Results:  Lab Results  Component Value Date   WBC 9.1 10/22/2021   HGB 14.7 10/22/2021   HCT 43.5 10/22/2021   MCV 96.9 10/22/2021   PLT 167 10/22/2021   NEUTROABS 6.4 10/22/2021    Medications: I have reviewed the patient's current medications.  Assessment/Plan: Cholangiocarcinoma  multiple liver masses and abdominal lymphadenopathy CTs 01/13/2020-rounded hypodense mass appears to arise from the pancreas neck, multiple rim-enhancing masses in the liver, primarily left liver with segmental dilation of the left lobe Intermatic bile ducts, ill-defined hypodensity the central liver with effacement of the left portal vein, enlarged portacaval and retroperitoneal lymph nodes Ultrasound-guided biopsy of the left liver lesion 01/19/2020-adenocarcinoma, cytokeratin 7+, MSS, tumor mutation burden 1, IDH1 R132C MRI abdomen 01/31/2020-poorly marginated central liver mass, multiple smaller similar satellite liver masses, extrinsic mass-effect at the biliary hilum with intrahepatic biliary  ductal dilatation throughout the left liver with mild Intermatic dilatation the superior right liver, no pancreas mass or ductal dilatation, normal spleen size, numerous enlarged enhancing lymph nodes at the porta hepatis, peripancreatic, portacaval, aortocaval, left periaortic chains Cycle 1 gemcitabine/cisplatin 02/04/2020 Cycle 2 gemcitabine/cisplatin 02/28/2020 Cycle 3 gemcitabine/cisplatin 03/20/2020 CT abdomen/pelvis 03/31/2020-mild decrease in central liver tumor, mild decrease in size of liver metastases and upper abdominal adenopathy, new splenomegaly Cycle 4 gemcitabine/cisplatin 04/10/2020 Cycle 5 gemcitabine/cisplatin 05/01/2020 Cycle 6 gemcitabine/cisplatin plus durvalumab 05/22/2020 CTs 06/06/2020- dominant central liver mass mildly enlarged, other liver lesions and abdominal adenopathy is stable, no evidence of metastatic disease to the chest Cycle 7 gemcitabine/cisplatin plus Durvalumab 06/12/2020 Cycle 8 gemcitabine/cisplatin plus Durvalumab 06/30/2020 Cycle 9 gemcitabine/cisplatin plus Durvalumab 07/31/2020 CT abdomen/pelvis 08/20/2020-decreased size of dominant central liver mass, slight increase in size and stable additional liver lesions, stable portacaval node Cycle 10 gemcitabine/cisplatin plus Durvalumab 08/21/2020 Cycle 11 gemcitabine/cisplatin plus Durvalumab 09/11/2020 Cycle 12 gemcitabine/cisplatin plus Durvalumab 10/02/2020 Cycle 13 gemcitabine/cisplatin plus Durvalumab 10/23/2020 CT abdomen/pelvis 11/10/2020-no change in dominant central liver mass and additional liver lesions, stable upper retroperitoneal adenopathy Cycle 14 gemcitabine/cisplatin plus Durvalumab 11/13/2020 Cycle 15 gemcitabine/cisplatin plus Durvalumab 12/04/2020 Cycle 15 gemcitabine/cisplatin plus Durvalumab 01/08/2021 Cycle 16 gemcitabine/cisplatin plus Durvalumab 01/30/2021-cisplatin held from day 1 and day 8 secondary to neuropathy CTs 02/15/2021-slight increase in size of central liver mass, progressive metastatic  lesions in the right liver, stable to slightly decreased periportal lymph nodes, progression of splenomegaly, chronically thrombosed left portal vein Cycle 1 FOLFOX 03/05/2021 Cycle 2 FOLFOX 03/19/2021, oxaliplatin dose reduced due to thrombocytopenia Cycle 3 FOLFOX 04/02/2021 Cycle 4 FOLFOX 04/16/2021 Cycle 5 FOLFOX 04/30/2021 CTs 05/10/2021-new and enlarging liver metastases, chronic left  lobe biliary dilatation, chronic left portal vein thrombosis, stable porta hepatis adenopathy, splenomegaly, no evidence of metastatic disease to the chest Ivosidenib 05/21/2021 CT 07/13/2021-increase in size of periportal node, stable retroperitoneal nodes, dominant left hepatic mass-stable, increased necrosis of other liver lesions, some have increased in size Ivosidenib continued CT abdomen/pelvis 09/06/2021-progression of ascites, progression of liver metastases, stable necrotic lymphadenopathy in the periportal region Ivosedinib discontinued 09/07/2021 Cycle 1 FOLFIRI 09/24/2021 Cycle 2 held on 10/10/2021 due to neutropenia, elevated bilirubin   Cough-likely related to diaphragmatic irritation from #1 Anorexia/weight loss Hypercalcemia-likely hypercalcemia malignancy, status post intravenous hydration and Zometa 01/14/2020 Diabetes Hyperlipidemia Hypertension History of peripheral neuropathy secondary to diabetes Severe back pain following Fulphila-oxycodone prescribed Thrombocytopenia secondary to chemotherapy-gemcitabine held with day 1 cycle 8 and then dose reduced COVID-19 infection 07/22/2020, 12/25/2020 Cisplatin induced peripheral neuropathy    Disposition: [name] appears stable.  He has completed 1 cycle of FOLFIRI.  Cycle 2 was held on 10/10/2021 due to neutropenia, elevated bilirubin.  CBC and chemistry panel reviewed.  Labs adequate to proceed with treatment.  Initial plan was to add white cell growth factor support with this cycle due to neutropenia following cycle 1.  Unfortunately we have not  received approval from his insurance company.  Irinotecan and 5-FU will be dose reduced.  Bilirubin remains elevated but is improved as compared to 2 weeks ago.  He has recurrent/refractory ascites.  He has undergone multiple paracentesis procedures.  He would like to have a peritoneal drainage catheter placed.  We reviewed the risk of infection as well as the potential for the fluid to become loculated.  He would like to proceed with the drainage catheter.  Referral made to interventional radiology.  He will return for lab, follow-up, cycle 3 FOLFIRI in 2 weeks.  Patient seen with Dr. Benay Spice.    Ned Card ANP/GNP-BC   10/22/2021  8:48 AM This was a shared visit with Ned Card.  [name] was interviewed and examined.  He has refractory ascites.  He would like to undergo placement of a peritoneal drainage catheter.  We discussed the risk of having a drainage catheter.  He would like to proceed.  The plan is to proceed with cycle 2 FOLFIRI today.  We will dose reduce the 5-FU and irinotecan since he has not yet been approved for G-CSF support.  I was present for greater than 50% of today's visit.  I performed medical decision making.  Julieanne Manson, MD

## 2021-10-22 NOTE — Progress Notes (Signed)
Patient seen by Ned Card NP today  Vitals are within treatment parameters.  Labs reviewed by Ned Card NP and are not all within treatment parameters. Bilirubin is 2.8 mg/dL. Per Ned Card, ok to treat.   Per physician team, patient is ready for treatment. Please note that modifications are being made to the treatment plan including chemo dose reduction. Per Ned Card, pt is getting dose reduction today. Ellen Henri is still pending approval through insurace, resulting in dose reduction today.

## 2021-10-23 ENCOUNTER — Telehealth (HOSPITAL_COMMUNITY): Payer: Self-pay

## 2021-10-23 ENCOUNTER — Telehealth: Payer: Self-pay

## 2021-10-23 ENCOUNTER — Other Ambulatory Visit: Payer: Self-pay

## 2021-10-23 DIAGNOSIS — C250 Malignant neoplasm of head of pancreas: Secondary | ICD-10-CM

## 2021-10-23 NOTE — Telephone Encounter (Signed)
Matthew Osborne. Williams at [facility] is working on getting the patient schedule for his drainage cath peritoneal.

## 2021-10-23 NOTE — Telephone Encounter (Signed)
-----   Message from Markus Daft, MD sent at 10/23/2021 12:19 PM EDT ----- Regarding: RE: Pleurx catheter [place] for peritoneal Pleurx catheter placement.   [place] ----- Message ----- From: Danielle Dess Sent: 10/23/2021  11:59 AM EDT To: [facility] Procedure Requests Subject: Pleurx catheter                                Procedure: Pleurx catheter   Dx: recurrent ascites   Ordering: Ned Card, NP - [id number]  Imaging: in epic  Please review.   Thanks,  Lia Foyer

## 2021-10-24 ENCOUNTER — Inpatient Hospital Stay: Payer: No Typology Code available for payment source

## 2021-10-24 ENCOUNTER — Other Ambulatory Visit (HOSPITAL_COMMUNITY): Payer: Self-pay | Admitting: Nurse Practitioner

## 2021-10-24 ENCOUNTER — Encounter: Payer: Self-pay | Admitting: *Deleted

## 2021-10-24 VITALS — BP 157/93 | HR 84 | Temp 98.1°F | Resp 18

## 2021-10-24 DIAGNOSIS — C250 Malignant neoplasm of head of pancreas: Secondary | ICD-10-CM

## 2021-10-24 DIAGNOSIS — Z5111 Encounter for antineoplastic chemotherapy: Secondary | ICD-10-CM | POA: Diagnosis not present

## 2021-10-24 DIAGNOSIS — C221 Intrahepatic bile duct carcinoma: Secondary | ICD-10-CM

## 2021-10-24 MED ORDER — SODIUM CHLORIDE 0.9% FLUSH
10.0000 mL | INTRAVENOUS | Status: DC | PRN
  Administered 2021-10-24: 10 mL

## 2021-10-24 MED ORDER — PEGFILGRASTIM-CBQV 6 MG/0.6ML ~~LOC~~ SOSY
6.0000 mg | PREFILLED_SYRINGE | Freq: Once | SUBCUTANEOUS | Status: AC
  Administered 2021-10-24: 6 mg via SUBCUTANEOUS
  Filled 2021-10-24: qty 0.6

## 2021-10-24 MED ORDER — HEPARIN SOD (PORK) LOCK FLUSH 100 UNIT/ML IV SOLN
500.0000 [IU] | Freq: Once | INTRAVENOUS | Status: AC | PRN
  Administered 2021-10-24: 500 [IU]

## 2021-10-24 NOTE — Patient Instructions (Signed)

## 2021-10-24 NOTE — Progress Notes (Signed)
Faxed 9/18 office note, labs to [facility] nurse case manager [phone]

## 2021-10-25 ENCOUNTER — Ambulatory Visit (HOSPITAL_COMMUNITY)
Admission: RE | Admit: 2021-10-25 | Discharge: 2021-10-25 | Disposition: A | Discharge status: Home / Self Care | Payer: No Typology Code available for payment source | Source: Ambulatory Visit | Attending: Nurse Practitioner | Admitting: Nurse Practitioner

## 2021-10-25 DIAGNOSIS — R188 Other ascites: Secondary | ICD-10-CM | POA: Diagnosis not present

## 2021-10-25 DIAGNOSIS — C250 Malignant neoplasm of head of pancreas: Secondary | ICD-10-CM | POA: Diagnosis present

## 2021-10-25 HISTORY — PX: IR PARACENTESIS: IMG2679

## 2021-10-25 MED ORDER — ALBUMIN HUMAN 25 % IV SOLN: 25.0000 g | Freq: Once | INTRAVENOUS | Status: AC

## 2021-10-25 MED ORDER — ALBUMIN HUMAN 25 % IV SOLN
INTRAVENOUS | Status: AC
  Administered 2021-10-25: 25 g via INTRAVENOUS
  Filled 2021-10-25: qty 100

## 2021-10-25 MED ORDER — LIDOCAINE HCL 1 % IJ SOLN
INTRAMUSCULAR | Status: AC
  Filled 2021-10-25: qty 20

## 2021-10-25 NOTE — Procedures (Signed)
PROCEDURE SUMMARY:  Successful US guided therapeutic paracentesis from RLQ.  Yielded 5 L of clear, yellow fluid.  No immediate complications.  Pt tolerated well.   Specimen not sent for labs.  EBL < 1 mL  Tyson Alias, AGNP 10/25/2021 2:59 PM

## 2021-10-30 ENCOUNTER — Other Ambulatory Visit: Payer: Self-pay | Admitting: Radiology

## 2021-10-30 DIAGNOSIS — R188 Other ascites: Secondary | ICD-10-CM

## 2021-10-31 ENCOUNTER — Other Ambulatory Visit: Payer: Self-pay | Admitting: Nurse Practitioner

## 2021-10-31 ENCOUNTER — Other Ambulatory Visit: Payer: Self-pay

## 2021-10-31 ENCOUNTER — Ambulatory Visit (HOSPITAL_COMMUNITY)
Admission: RE | Admit: 2021-10-31 | Discharge: 2021-10-31 | Disposition: A | Discharge status: Home / Self Care | Payer: No Typology Code available for payment source | Source: Ambulatory Visit | Attending: Nurse Practitioner | Admitting: Nurse Practitioner

## 2021-10-31 ENCOUNTER — Encounter: Payer: Self-pay | Admitting: *Deleted

## 2021-10-31 ENCOUNTER — Encounter (HOSPITAL_COMMUNITY): Payer: Self-pay

## 2021-10-31 DIAGNOSIS — E119 Type 2 diabetes mellitus without complications: Secondary | ICD-10-CM | POA: Insufficient documentation

## 2021-10-31 DIAGNOSIS — E785 Hyperlipidemia, unspecified: Secondary | ICD-10-CM | POA: Insufficient documentation

## 2021-10-31 DIAGNOSIS — C221 Intrahepatic bile duct carcinoma: Secondary | ICD-10-CM | POA: Insufficient documentation

## 2021-10-31 DIAGNOSIS — I10 Essential (primary) hypertension: Secondary | ICD-10-CM | POA: Insufficient documentation

## 2021-10-31 DIAGNOSIS — R188 Other ascites: Secondary | ICD-10-CM

## 2021-10-31 HISTORY — PX: IR IMAGE GUIDED DRAINAGE PERCUT CATH  PERITONEAL RETROPERIT: IMG5467

## 2021-10-31 LAB — CBC
HCT: 37.2 % — ABNORMAL LOW (ref 39.0–52.0)
Hemoglobin: 13.8 g/dL (ref 13.0–17.0)
MCH: 34.2 pg — ABNORMAL HIGH (ref 26.0–34.0)
MCHC: 37.1 g/dL — ABNORMAL HIGH (ref 30.0–36.0)
MCV: 92.1 fL (ref 80.0–100.0)
Platelets: 168 10*3/uL (ref 150–400)
RBC: 4.04 MIL/uL — ABNORMAL LOW (ref 4.22–5.81)
RDW: 15.9 % — ABNORMAL HIGH (ref 11.5–15.5)
WBC: 5.6 10*3/uL (ref 4.0–10.5)
nRBC: 0 % (ref 0.0–0.2)

## 2021-10-31 LAB — PROTIME-INR
INR: 1.3 — ABNORMAL HIGH (ref 0.8–1.2)
Prothrombin Time: 16.1 seconds — ABNORMAL HIGH (ref 11.4–15.2)

## 2021-10-31 MED ORDER — FENTANYL CITRATE (PF) 100 MCG/2ML IJ SOLN
INTRAMUSCULAR | Status: AC
  Filled 2021-10-31: qty 2

## 2021-10-31 MED ORDER — MIDAZOLAM HCL 2 MG/2ML IJ SOLN
INTRAMUSCULAR | Status: AC | PRN
  Administered 2021-10-31: 1 mg via INTRAVENOUS

## 2021-10-31 MED ORDER — MIDAZOLAM HCL 2 MG/2ML IJ SOLN
INTRAMUSCULAR | Status: AC
  Filled 2021-10-31: qty 2

## 2021-10-31 MED ORDER — HYDROMORPHONE HCL 1 MG/ML IJ SOLN
INTRAMUSCULAR | Status: AC
  Filled 2021-10-31: qty 1

## 2021-10-31 MED ORDER — SODIUM CHLORIDE 0.9 % IV SOLN: INTRAVENOUS | Status: DC

## 2021-10-31 MED ORDER — HYDROMORPHONE HCL 1 MG/ML IJ SOLN
INTRAMUSCULAR | Status: AC | PRN
  Administered 2021-10-31: 1 mg via INTRAVENOUS

## 2021-10-31 MED ORDER — FENTANYL CITRATE (PF) 100 MCG/2ML IJ SOLN
INTRAMUSCULAR | Status: AC | PRN
  Administered 2021-10-31: 25 ug via INTRAVENOUS

## 2021-10-31 NOTE — Progress Notes (Signed)
Faxed orders with insurance information to [facility] for PleurX drainage kits to be delivered to home. IR provided #3 kits today. Matthew Osborne feels she is able to manage based on experience at work. Emailed her the procedure and link to watch video.

## 2021-10-31 NOTE — Progress Notes (Signed)
This tech contacted Engineering geologist at Dr. Gearldine Shown office regarding Pleurx catheter placement information and confirmed with RN. Order was faxed to number given.

## 2021-10-31 NOTE — Sedation Documentation (Signed)
Patient is resting comfortably. 

## 2021-10-31 NOTE — H&P (Signed)
 Chief Complaint: Patient was seen in consultation today for recurrent ascites at the request of Matthew Osborne  Referring Physician(s): Matthew Osborne  Supervising Physician: Matthew Osborne  Patient Status: [facility] - Out-pt  History of Present Illness: Matthew Osborne is Osborne 46 y.o. male with PMHs of HTN, HLD, DM, and cholangiocarcinoma with recurrent ascites who presents today for peritoneal tunneled catheter (PleurX) placement.   Patient is well known to IR service for previous paracentesis, he has been getting paracentesis up to 4-5L once Osborne week since 09/10/21. He inquired peritoneal PleurX placement during his last visit with oncology team, the risk of peritonitis and the ascites becoming loculated which may cause malfunctioning PleruX. After thorough discussion, patient decided to proceed with peritoneal PleurX placement.   Peritoneal PleurX placement is typically offered for hospice patient, and Matthew Osborne is currently not Osborne hospice patient. Discussed with Dr. El-Abd, ok to proceed.   Patient laying in bed, not in acute distress but in tears due to discomfort from ascites/abdominal distension. Spouse at bedside.    Past Medical History:  Diagnosis Date   Cancer (HCC)    De Quervain's tenosynovitis, left 03/2011   Diabetes mellitus    NIDDM   Family history of breast cancer    Family history of Hodgkin's lymphoma    Hyperlipidemia    Hypertension    under control; has been on med. x 4 yrs.   Nonproliferative retinopathy due to secondary diabetes (HCC)     Past Surgical History:  Procedure Laterality Date   CARPAL TUNNEL RELEASE     bilat.   DORSAL COMPARTMENT RELEASE  03/14/2011   Procedure: RELEASE DORSAL COMPARTMENT (DEQUERVAIN);  Surgeon: Matthew Osborne Matthew III, Matthew Osborne;  Location: [facility];  Service: Orthopedics;  Laterality: Left;  left wrist APL and EPL tenosynovectomy and 1st dorsal compartment release   IR IMAGING GUIDED PORT INSERTION  02/01/2020   IR  PARACENTESIS  09/10/2021   IR PARACENTESIS  09/21/2021   IR PARACENTESIS  09/27/2021   IR PARACENTESIS  10/03/2021   IR PARACENTESIS  10/12/2021   IR PARACENTESIS  10/19/2021   IR PARACENTESIS  10/25/2021   TONSILLECTOMY AND ADENOIDECTOMY  as Osborne child   VASECTOMY      Allergies: Niaspan [niacin]  Medications: Prior to Admission medications   Medication Sig Start Date End Date Taking? Authorizing Provider  ALPRAZolam (XANAX) 0.5 MG tablet Take 1 tablet (0.5 mg total) by mouth 3 (three) times daily as needed. 08/20/21   Osborne, Matthew Osborne, Matthew Osborne  bisoprolol-hydrochlorothiazide (ZIAC) 10-6.25 MG tablet Take 1 tablet by mouth in the morning. 10/18/21   Osborne, Matthew Osborne, Matthew Osborne  blood glucose meter kit and supplies KIT Fasting prior to each meal three times Osborne day 08/25/19   Osborne, Matthew Osborne, Matthew Osborne  Continuous Blood Gluc Sensor (DEXCOM G6 SENSOR) MISC Use as directed to check blood sugar 5-6 times daily. 08/20/21   Osborne, Matthew Osborne, Matthew Osborne  Continuous Blood Gluc Transmit (DEXCOM G6 TRANSMITTER) MISC USE AS DIRECTED TO CHECK BLOOD SUGAR 5-6 TIMES DAILY *needs office visit* 04/30/21 04/30/22  Osborne, Matthew Osborne, Matthew Osborne  FLUoxetine (PROZAC) 20 MG capsule Take 1 capsule (20 mg total) by mouth daily. 09/18/21   Osborne, Matthew Osborne, Matthew Osborne  Glucagon (BAQSIMI TWO PACK) 3 MG/DOSE POWD as directed Nasally Once Osborne day PRN 30 days 05/15/21     glucose blood test strip Dispense based on patient and insurance preference.  Use twice daily as directed. (FOR ICD-10 E11.65) 08/29/17     Matthew Frizzle, Matthew Osborne  Hydrocod Polst-Chlorphen Polst (CHLORPHENIRAMINE-HYDROCODONE) 10-8 MG/5ML Take 5 milliliters by mouth every 12 (twelve) hours as needed for cough. 08/20/21   Matthew Pier, Matthew Osborne  Insulin Disposable Pump (OMNIPOD 5 G6 POD, GEN 5,) MISC Use as directed every 48 hours 09/07/21     insulin lispro (HUMALOG) 100 UNIT/ML injection Inject 200 units/day under the skin via pump 05/15/21     Insulin Syringe 27G X 1/2" 0.5 ML MISC Use as directed to inject insulin  SQ Q1D. 01/31/20   Matthew Frizzle, Matthew Osborne  Lancets Thin MISC Check BS BID 08/29/17   Matthew Frizzle, Matthew Osborne  lidocaine-prilocaine (EMLA) cream APPLY 1 APPLICATION TOPICALLY AS DIRECTED. APPLY TO PORT SITE 1-2 HOURS PRIOR TO STICK AND COVER WITH PLASTIC WRAP. 07/03/21 07/03/22  Matthew Pier, Matthew Osborne  LORazepam (ATIVAN) 0.5 MG tablet Take 1 tablet (0.5 mg total) by mouth at bedtime as needed for anxiety. Patient taking differently: Take 0.5 mg by mouth at bedtime as needed (anxiety/agitation after chemo treatments). 11/13/20   Matthew Pier, Matthew Osborne  oxyCODONE (OXY IR/ROXICODONE) 5 MG immediate release tablet TAKE 1 TABLET BY MOUTH EVERY 6 HOURS AS NEEDED FOR SEVERE PAIN. 10/22/21 04/20/22  Matthew Shark, Matthew Osborne  prochlorperazine (COMPAZINE) 10 MG tablet TAKE 1 TABLET BY MOUTH EVERY 6 HOURS AS NEEDED FOR NAUSEA 10/09/21 10/09/22  Matthew Pier, Matthew Osborne  spironolactone (ALDACTONE) 25 MG tablet Take 1 tablet (25 mg total) by mouth daily. 10/10/21   Matthew Shark, Matthew Osborne  traMADol (ULTRAM) 50 MG tablet TAKE 1 TABLET BY MOUTH EVERY 6 HOURS AS NEEDED FOR MODERATE PAIN (COUGH). 08/20/21 02/16/22  Matthew Pier, Matthew Osborne  TRUEPLUS LANCETS 33G MISC 1 each by Does not apply route 2 (two) times daily. 07/06/13   Matthew Frizzle, Matthew Osborne     Family History  Problem Relation Age of Onset   Cancer Other    Hyperlipidemia Other    Hypertension Brother    Asthma Son    Lung cancer Maternal Grandfather    Mesothelioma Maternal Grandfather        asbestos exposure   Rheum arthritis Maternal Grandmother    Breast cancer Maternal Grandmother 61   Hodgkin's lymphoma Mother 37       30 lb tumor removed from abdomen   Cirrhosis Paternal Grandmother    Other Maternal Great-grandfather        black lung, worked in [employer] (MGF's father)    Social History   Socioeconomic History   Marital status: Married    Spouse name: Not on file   Number of children: Not on file   Years of education: Not on file   Highest education level: Not on file   Occupational History   Not on file  Tobacco Use   Smoking status: Former    Packs/day: 3.00    Years: 6.00    Total pack years: 18.00    Types: Cigarettes    Quit date: 02/05/1999    Years since quitting: 22.7   Smokeless tobacco: Never  Vaping Use   Vaping Use: Never used  Substance and Sexual Activity   Alcohol use: No    Alcohol/week: 0.0 standard drinks of alcohol   Drug use: No   Sexual activity: Not on file  Other Topics Concern   Not on file  Social History Narrative   Not on file   Social Determinants of Health   Financial Resource Strain: Low Risk  (01/18/2020)   Overall  Financial Resource Strain (CARDIA)    Difficulty of Paying Living Expenses: Not hard at all  Food Insecurity: No Food Insecurity (01/05/2021)   Hunger Vital Sign    Worried About Running Out of Food in the Last Year: Never true    Ran Out of Food in the Last Year: Never true  Transportation Needs: No Transportation Needs (01/05/2021)   PRAPARE - Transportation    Lack of Transportation (Medical): No    Lack of Transportation (Non-Medical): No  Physical Activity: Not on file  Stress: Stress Concern Present (01/18/2020)   [employer] - Occupational Stress Questionnaire    Feeling of Stress : To some extent  Social Connections: Socially Integrated (01/18/2020)   Social Connection and Isolation Panel [NHANES]    Frequency of Communication with Friends and Family: More than three times Osborne week    Frequency of Social Gatherings with Friends and Family: More than three times Osborne week    Attends Religious Services: More than 4 times per year    Active Member of Clubs or Organizations: Yes    Attends Club or Organization Meetings: More than 4 times per year    Marital Status: Married     Review of Systems: Osborne 12 point ROS discussed and pertinent positives are indicated in the HPI above.  All other systems are negative.  Vital Signs: BP 132/88   Pulse 81   Temp 97.8 F  (36.6 C) (Temporal)   Resp 17   Ht 6' 2" (1.88 Osborne)   Wt 230 lb (104.3 kg) Comment: wife stated without fluid weight is 125lbs  SpO2 98%   BMI 29.53 kg/Osborne    Physical Exam Vitals reviewed.  Constitutional:      General: He is not in acute distress.    Appearance: He is ill-appearing.  HENT:     Head: Normocephalic.     Mouth/Throat:     Mouth: Mucous membranes are moist.     Pharynx: Oropharynx is clear.  Cardiovascular:     Rate and Rhythm: Normal rate and regular rhythm.     Heart sounds: Normal heart sounds.  Pulmonary:     Effort: Pulmonary effort is normal.     Breath sounds: Normal breath sounds.  Abdominal:     General: Bowel sounds are normal. There is distension.  Skin:    General: Skin is warm and dry.     Coloration: Skin is not jaundiced or pale.  Neurological:     Mental Status: He is alert and oriented to person, place, and time.  Psychiatric:        Behavior: Behavior normal.     Matthew Osborne Evaluation Airway: WNL Heart: WNL Abdomen: WNL Chest/ Lungs: WNL ASA  Classification: 3 Mallampati/Airway Score: Two  Imaging: IR Paracentesis  Result Date: 10/25/2021 INDICATION: Cholangiogram with recurrent malignant ascites. Request for therapeutic paracentesis. EXAM: ULTRASOUND GUIDED THERAPEUTIC RIGHT LOWER QUADRANT PARACENTESIS MEDICATIONS: 12 mL 1 % lidocaine COMPLICATIONS: None immediate. PROCEDURE: Informed written consent was obtained from the patient after Osborne discussion of the risks, benefits and alternatives to treatment. Osborne timeout was performed prior to the initiation of the procedure. Initial ultrasound scanning demonstrates Osborne moderate amount of ascites within the right lower abdominal quadrant. The right lower abdomen was prepped and draped in the usual sterile fashion. 1% lidocaine was used for local anesthesia. Following this, Osborne 19 gauge, 10-cm, Yueh catheter was introduced. An ultrasound image was saved for documentation purposes. The paracentesis was  performed. The catheter   was removed and Osborne dressing was applied. The patient tolerated the procedure well without immediate post procedural complication. Patient received post-procedure intravenous albumin; see nursing notes for details. FINDINGS: Osborne total of approximately 5 L of clear, yellow fluid was removed. IMPRESSION: Successful ultrasound-guided paracentesis yielding 5 liters of peritoneal fluid. Read by: Susan Clapp, AGNP-BC Electronically Signed   By: Jaime  Wagner D.O.   On: 10/25/2021 15:21   IR Paracentesis  Result Date: 10/19/2021 INDICATION: Patient with history of cholangiocarcinoma with recurrent malignant ascites. Request is for therapeutic paracentesis. 4 L maximum EXAM: ULTRASOUND GUIDED THERAPEUTIC PARACENTESIS MEDICATIONS: Lidocaine 1% 10 mL COMPLICATIONS: None immediate. PROCEDURE: Informed written consent was obtained from the patient after Osborne discussion of the risks, benefits and alternatives to treatment. Osborne timeout was performed prior to the initiation of the procedure. Initial ultrasound scanning demonstrates Osborne large amount of ascites within the left lower abdominal quadrant. The left lower abdomen was prepped and draped in the usual sterile fashion. 1% lidocaine was used for local anesthesia. Following this, Osborne 19 gauge, 7-cm, Yueh catheter was introduced. An ultrasound image was saved for documentation purposes. The paracentesis was performed. The catheter was removed and Osborne dressing was applied. The patient tolerated the procedure well without immediate post procedural complication. FINDINGS: Osborne total of approximately 4 L  of straw-colored fluid was removed. IMPRESSION: Successful ultrasound-guided therapeutic paracentesis yielding 4 L liters of peritoneal fluid. Read by: Jennifer Omohundro, Matthew Osborne Electronically Signed   By: Osborne.  Shick Osborne.D.   On: 10/19/2021 12:15   IR Paracentesis  Result Date: 10/12/2021 INDICATION: Cholangiocarcinoma with recurrent malignant ascites. Request for  therapeutic paracentesis up to 5 L. EXAM: ULTRASOUND GUIDED PARACENTESIS MEDICATIONS: 1% lidocaine 20 mL COMPLICATIONS: None immediate. PROCEDURE: Informed written consent was obtained from the patient after Osborne discussion of the risks, benefits and alternatives to treatment. Osborne timeout was performed prior to the initiation of the procedure. Initial ultrasound scanning demonstrates Osborne large amount of ascites within the left lower abdominal quadrant. The left lower abdomen was prepped and draped in the usual sterile fashion. 1% lidocaine was used for local anesthesia. Following this, Osborne 19 gauge, 10-cm, Yueh catheter was introduced. An ultrasound image was saved for documentation purposes. The paracentesis was performed. The catheter was removed and Osborne dressing was applied. The patient tolerated the procedure well without immediate post procedural complication. Patient received post-procedure intravenous albumin; see nursing notes for details. FINDINGS: Osborne total of approximately 5 L of clear yellow fluid was removed. IMPRESSION: Successful ultrasound-guided paracentesis yielding 5 liters of peritoneal fluid. Procedure performed by: Wendy Blair, Matthew Osborne Electronically Signed   By: Farhaan  Mir Osborne.D.   On: 10/12/2021 13:10   IR Paracentesis  Result Date: 10/03/2021 INDICATION: Patient with Osborne history of cholangiocarcinoma and recurrent ascites. Interventional radiology asked to perform Osborne therapeutic paracentesis up to 5 L. EXAM: ULTRASOUND GUIDED PARACENTESIS MEDICATIONS: 1% lidocaine 15 mL COMPLICATIONS: None immediate. PROCEDURE: Informed written consent was obtained from the patient after Osborne discussion of the risks, benefits and alternatives to treatment. Osborne timeout was performed prior to the initiation of the procedure. Initial ultrasound scanning demonstrates Osborne large amount of ascites within the left lower abdominal quadrant. The left lower abdomen was prepped and draped in the usual sterile fashion. 1% lidocaine was used  for local anesthesia. Following this, Osborne 19 gauge, 7-cm, Yueh catheter was introduced. An ultrasound image was saved for documentation purposes. The paracentesis was performed. The catheter was removed and Osborne dressing was applied. The patient tolerated   the procedure well without immediate post procedural complication. FINDINGS: Osborne total of approximately 5.5 L of clear yellow fluid was removed. IMPRESSION: Successful ultrasound-guided paracentesis yielding 5.5 liters of peritoneal fluid. Read by: Jamie Covington, Matthew Osborne Electronically Signed   By: John  Watts Osborne.D.   On: 10/03/2021 15:37    Labs:  CBC: Recent Labs    09/24/21 1030 10/10/21 1027 10/22/21 0813 10/31/21 1015  WBC 7.2 3.0* 9.1 5.6  HGB 14.2 14.0 14.7 13.8  HCT 41.4 40.5 43.5 37.2*  PLT 161 164 167 168    COAGS: Recent Labs    10/31/21 0936  INR 1.3*    BMP: Recent Labs    09/06/21 0825 09/24/21 1030 10/10/21 1027 10/22/21 0813  NA 132* 133* 131* 129*  Osborne 4.0 4.1 4.7 3.7  CL 99 98 98 96*  CO2 27 29 26 27  GLUCOSE 138* 106* 116* 114*  BUN 11 12 16 23*  CALCIUM 10.1 10.1 8.6* 9.3  CREATININE 0.77 0.74 0.88 0.89  GFRNONAA >60 >60 >60 >60    LIVER FUNCTION TESTS: Recent Labs    09/06/21 0825 09/24/21 1030 10/10/21 1027 10/22/21 0813  BILITOT 3.6* 2.6* 3.6* 2.8*  AST 186* 100* 121* 139*  ALT 31 19 29 33  ALKPHOS 168* 252* 343* 397*  PROT 6.6 6.8 6.5 6.5  ALBUMIN 3.0* 2.8* 2.6* 2.5*    TUMOR MARKERS: No results for input(s): "AFPTM", "CEA", "CA199", "CHROMGRNA" in the last 8760 hours.  Assessment and Plan: 46 y.o. male with HCC and recurrent ascites who presents for peritoneal tunneled catheter placement.   Typically labs are not required for peritoneal PleurX catheter, however, decision was made to obtain CBC and INR due to patient's history of cholangiocarcinoma.    NPO since MN VSS CBC stable INR 1.3 Not on AC/AP   Risks and benefits discussed with the patient including bleeding, infection,  damage to adjacent structures, development of peritonitis which can be life threatening, malfunctioning of the catheter which may require repair/replacement.   All of the patient's questions were answered, patient is agreeable to proceed. Consent signed and in chart.   Patient states that he does not have bottled/kit for PleurX at home.  Message sent to oncology team regarding follow up and supplies for PleurX catheter.  Thank you for this interesting consult.  I greatly enjoyed meeting Matthew Osborne and look forward to participating in their care.  Osborne copy of this report was sent to the requesting provider on this date.  Electronically Signed: Aimee H Han, Matthew Osborne 10/31/2021, 11:26 AM   I spent Osborne total of    25 Minutes in face to face in clinical consultation, greater than 50% of which was counseling/coordinating care for peritoneal tunneled catheter placement.   This chart was dictated using voice recognition software.  Despite best efforts to proofread,  errors can occur which can change the documentation meaning.   

## 2021-11-01 ENCOUNTER — Ambulatory Visit (HOSPITAL_COMMUNITY): Payer: No Typology Code available for payment source

## 2021-11-01 ENCOUNTER — Encounter: Payer: Self-pay | Admitting: *Deleted

## 2021-11-01 NOTE — Progress Notes (Signed)
Received faxed confirmation from [facility] that orders for PleurX kits received. Notified [name] as well.

## 2021-11-04 ENCOUNTER — Other Ambulatory Visit: Payer: Self-pay | Admitting: Oncology

## 2021-11-05 ENCOUNTER — Telehealth: Payer: Self-pay

## 2021-11-05 ENCOUNTER — Other Ambulatory Visit: Payer: Self-pay | Admitting: Family Medicine

## 2021-11-05 ENCOUNTER — Encounter: Payer: Self-pay | Admitting: Oncology

## 2021-11-05 ENCOUNTER — Encounter: Payer: Self-pay | Admitting: Family Medicine

## 2021-11-05 ENCOUNTER — Inpatient Hospital Stay: Payer: No Typology Code available for payment source | Attending: Oncology

## 2021-11-05 ENCOUNTER — Other Ambulatory Visit (HOSPITAL_BASED_OUTPATIENT_CLINIC_OR_DEPARTMENT_OTHER): Payer: Self-pay

## 2021-11-05 ENCOUNTER — Inpatient Hospital Stay (HOSPITAL_BASED_OUTPATIENT_CLINIC_OR_DEPARTMENT_OTHER): Payer: No Typology Code available for payment source | Admitting: Oncology

## 2021-11-05 ENCOUNTER — Inpatient Hospital Stay: Payer: No Typology Code available for payment source

## 2021-11-05 ENCOUNTER — Encounter: Payer: Self-pay | Admitting: *Deleted

## 2021-11-05 VITALS — BP 133/88 | HR 78 | Temp 98.1°F | Resp 18 | Ht 74.0 in | Wt 206.2 lb

## 2021-11-05 VITALS — BP 126/78 | HR 68

## 2021-11-05 DIAGNOSIS — E785 Hyperlipidemia, unspecified: Secondary | ICD-10-CM | POA: Diagnosis not present

## 2021-11-05 DIAGNOSIS — Z5189 Encounter for other specified aftercare: Secondary | ICD-10-CM | POA: Insufficient documentation

## 2021-11-05 DIAGNOSIS — C221 Intrahepatic bile duct carcinoma: Secondary | ICD-10-CM

## 2021-11-05 DIAGNOSIS — D696 Thrombocytopenia, unspecified: Secondary | ICD-10-CM | POA: Insufficient documentation

## 2021-11-05 DIAGNOSIS — I10 Essential (primary) hypertension: Secondary | ICD-10-CM | POA: Insufficient documentation

## 2021-11-05 DIAGNOSIS — Z5111 Encounter for antineoplastic chemotherapy: Secondary | ICD-10-CM | POA: Insufficient documentation

## 2021-11-05 DIAGNOSIS — E1142 Type 2 diabetes mellitus with diabetic polyneuropathy: Secondary | ICD-10-CM | POA: Diagnosis not present

## 2021-11-05 LAB — CBC WITH DIFFERENTIAL (CANCER CENTER ONLY)
Abs Immature Granulocytes: 0.35 10*3/uL — ABNORMAL HIGH (ref 0.00–0.07)
Basophils Absolute: 0.1 10*3/uL (ref 0.0–0.1)
Basophils Relative: 1 %
Eosinophils Absolute: 0.2 10*3/uL (ref 0.0–0.5)
Eosinophils Relative: 3 %
HCT: 37.3 % — ABNORMAL LOW (ref 39.0–52.0)
Hemoglobin: 13 g/dL (ref 13.0–17.0)
Immature Granulocytes: 4 %
Lymphocytes Relative: 14 %
Lymphs Abs: 1.2 10*3/uL (ref 0.7–4.0)
MCH: 33.1 pg (ref 26.0–34.0)
MCHC: 34.9 g/dL (ref 30.0–36.0)
MCV: 94.9 fL (ref 80.0–100.0)
Monocytes Absolute: 1.1 10*3/uL — ABNORMAL HIGH (ref 0.1–1.0)
Monocytes Relative: 13 %
Neutro Abs: 5.6 10*3/uL (ref 1.7–7.7)
Neutrophils Relative %: 65 %
Platelet Count: 165 10*3/uL (ref 150–400)
RBC: 3.93 MIL/uL — ABNORMAL LOW (ref 4.22–5.81)
RDW: 16.5 % — ABNORMAL HIGH (ref 11.5–15.5)
WBC Count: 8.6 10*3/uL (ref 4.0–10.5)
nRBC: 0 % (ref 0.0–0.2)

## 2021-11-05 LAB — CMP (CANCER CENTER ONLY)
ALT: 37 U/L (ref 0–44)
AST: 110 U/L — ABNORMAL HIGH (ref 15–41)
Albumin: 2.3 g/dL — ABNORMAL LOW (ref 3.5–5.0)
Alkaline Phosphatase: 441 U/L — ABNORMAL HIGH (ref 38–126)
Anion gap: 7 (ref 5–15)
BUN: 19 mg/dL (ref 6–20)
CO2: 23 mmol/L (ref 22–32)
Calcium: 7.5 mg/dL — ABNORMAL LOW (ref 8.9–10.3)
Chloride: 90 mmol/L — ABNORMAL LOW (ref 98–111)
Creatinine: 0.81 mg/dL (ref 0.61–1.24)
GFR, Estimated: >60 mL/min (ref >60)
Glucose, Bld: 158 mg/dL — ABNORMAL HIGH (ref 70–99)
Potassium: 4.8 mmol/L (ref 3.5–5.1)
Sodium: 120 mmol/L — ABNORMAL LOW (ref 135–145)
Total Bilirubin: 4 mg/dL (ref 0.3–1.2)
Total Protein: 5.6 g/dL — ABNORMAL LOW (ref 6.5–8.1)

## 2021-11-05 MED ORDER — SODIUM CHLORIDE 0.9 % IV SOLN
180.0000 mg/m2 | Freq: Once | INTRAVENOUS | Status: AC
  Administered 2021-11-05: 400 mg via INTRAVENOUS
  Filled 2021-11-05: qty 20

## 2021-11-05 MED ORDER — LORAZEPAM 0.5 MG PO TABS
0.5000 mg | ORAL_TABLET | Freq: Every evening | ORAL | 0 refills | Status: DC | PRN
  Filled 2021-11-05: qty 30, 30d supply, fill #0

## 2021-11-05 MED ORDER — SODIUM CHLORIDE 0.9 % IV SOLN: Freq: Once | INTRAVENOUS | Status: AC

## 2021-11-05 MED ORDER — DEXAMETHASONE SODIUM PHOSPHATE 10 MG/ML IJ SOLN
5.0000 mg | Freq: Once | INTRAMUSCULAR | Status: AC
  Administered 2021-11-05: 5 mg via INTRAVENOUS
  Filled 2021-11-05: qty 1

## 2021-11-05 MED ORDER — SODIUM CHLORIDE 0.9 % IV SOLN
150.0000 mg | Freq: Once | INTRAVENOUS | Status: AC
  Administered 2021-11-05: 150 mg via INTRAVENOUS
  Filled 2021-11-05: qty 5

## 2021-11-05 MED ORDER — SODIUM CHLORIDE 0.9 % IV SOLN
1600.0000 mg/m2 | INTRAVENOUS | Status: DC
  Administered 2021-11-05: 3550 mg via INTRAVENOUS
  Filled 2021-11-05: qty 71

## 2021-11-05 MED ORDER — SODIUM CHLORIDE 0.9 % IV SOLN
80.0000 mg/m2 | Freq: Once | INTRAVENOUS | Status: AC
  Administered 2021-11-05: 180 mg via INTRAVENOUS
  Filled 2021-11-05: qty 4

## 2021-11-05 MED ORDER — PALONOSETRON HCL INJECTION 0.25 MG/5ML
0.2500 mg | Freq: Once | INTRAVENOUS | Status: AC
  Administered 2021-11-05: 0.25 mg via INTRAVENOUS
  Filled 2021-11-05: qty 5

## 2021-11-05 MED ORDER — DEXCOM G6 TRANSMITTER MISC
1 refills | Status: AC
  Filled 2021-11-05: qty 1, 30d supply, fill #0

## 2021-11-05 MED ORDER — FLUOROURACIL CHEMO INJECTION 500 MG/10ML
180.0000 mg/m2 | Freq: Once | INTRAVENOUS | Status: AC
  Administered 2021-11-05: 400 mg via INTRAVENOUS
  Filled 2021-11-05: qty 8

## 2021-11-05 MED ORDER — ATROPINE SULFATE 1 MG/ML IV SOLN
0.5000 mg | Freq: Once | INTRAVENOUS | Status: AC | PRN
  Administered 2021-11-05: 0.5 mg via INTRAVENOUS
  Filled 2021-11-05: qty 1

## 2021-11-05 NOTE — Patient Instructions (Signed)
[place]   Discharge Instructions: Thank you for choosing [facility] to provide your oncology and hematology care.   If you have a lab appointment with the [facility], please go directly to the [facility] and check in at the registration area.   Wear comfortable clothing and clothing appropriate for easy access to any Portacath or PICC line.   We strive to give you quality time with your provider. You may need to reschedule your appointment if you arrive late (15 or more minutes).  Arriving late affects you and other patients whose appointments are after yours.  Also, if you miss three or more appointments without notifying the office, you may be dismissed from the clinic at the provider's discretion.      For prescription refill requests, have your pharmacy contact our office and allow 72 hours for refills to be completed.    Today you received the following chemotherapy and/or immunotherapy agents Irinotecan (CAMPTOSAR), Leucovorin & Flourouracil (ADRUCIL).      To help prevent nausea and vomiting after your treatment, we encourage you to take your nausea medication as directed.  BELOW ARE SYMPTOMS THAT SHOULD BE REPORTED IMMEDIATELY: *FEVER GREATER THAN 100.4 F (38 C) OR HIGHER *CHILLS OR SWEATING *NAUSEA AND VOMITING THAT IS NOT CONTROLLED WITH YOUR NAUSEA MEDICATION *UNUSUAL SHORTNESS OF BREATH *UNUSUAL BRUISING OR BLEEDING *URINARY PROBLEMS (pain or burning when urinating, or frequent urination) *BOWEL PROBLEMS (unusual diarrhea, constipation, pain near the anus) TENDERNESS IN MOUTH AND THROAT WITH OR WITHOUT PRESENCE OF ULCERS (sore throat, sores in mouth, or a toothache) UNUSUAL RASH, SWELLING OR PAIN  UNUSUAL VAGINAL DISCHARGE OR ITCHING   Items with * indicate a potential emergency and should be followed up as soon as possible or go to the Emergency Department if any problems should occur.  Please show the CHEMOTHERAPY ALERT  CARD or IMMUNOTHERAPY ALERT CARD at check-in to the Emergency Department and triage nurse.  Should you have questions after your visit or need to cancel or reschedule your appointment, please contact [facility]  Dept: [phone]  and follow the prompts.  Office hours are 8:00 a.m. to 4:30 p.m. Monday - Friday. Please note that voicemails left after 4:00 p.m. may not be returned until the following business day.  We are closed weekends and major holidays. You have access to a nurse at all times for urgent questions. Please call the main number to the clinic Dept: [phone] and follow the prompts.   For any non-urgent questions, you may also contact your provider using MyChart. We now offer e-Visits for anyone 35 and older to request care online for non-urgent symptoms. For details visit [email or web address].   Also download the MyChart app! Go to the app store, search "MyChart", open the app, select [facility], and log in with your MyChart username and password.  Masks are optional in the cancer centers. If you would like for your care team to wear a mask while they are taking care of you, please let them know. You may have one support person who is at least 46 years old accompany you for your appointments.  Irinotecan Injection What is this medication? IRINOTECAN (ir in oh TEE kan) treats some types of cancer. It works by slowing down the growth of cancer cells. This medicine may be used for other purposes; ask your health care provider or pharmacist if you have questions. COMMON BRAND NAME(S): Camptosar What should I tell my  care team before I take this medication? They need to know if you have any of these conditions: Dehydration Diarrhea Infection, especially a viral infection, such as chickenpox, cold sores, herpes Liver disease Low blood cell levels (white cells, red cells, and platelets) Low levels of electrolytes, such as calcium, magnesium, or  potassium in your blood Recent or ongoing radiation An unusual or allergic reaction to irinotecan, other medications, foods, dyes, or preservatives If you or your partner are pregnant or trying to get pregnant Breast-feeding How should I use this medication? This medication is injected into a vein. It is given by your care team in a hospital or clinic setting. Talk to your care team about the use of this medication in children. Special care may be needed. Overdosage: If you think you have taken too much of this medicine contact a poison control center or emergency room at once. NOTE: This medicine is only for you. Do not share this medicine with others. What if I miss a dose? Keep appointments for follow-up doses. It is important not to miss your dose. Call your care team if you are unable to keep an appointment. What may interact with this medication? Do not take this medication with any of the following: Cobicistat Itraconazole This medication may also interact with the following: Certain antibiotics, such as clarithromycin, rifampin, rifabutin Certain antivirals for HIV or AIDS Certain medications for fungal infections, such as ketoconazole, posaconazole, voriconazole Certain medications for seizures, such as carbamazepine, phenobarbital, phenytoin Gemfibrozil Nefazodone St. John's wort This list may not describe all possible interactions. Give your health care provider a list of all the medicines, herbs, non-prescription drugs, or dietary supplements you use. Also tell them if you smoke, drink alcohol, or use illegal drugs. Some items may interact with your medicine. What should I watch for while using this medication? Your condition will be monitored carefully while you are receiving this medication. You may need blood work while taking this medication. This medication may make you feel generally unwell. This is not uncommon as chemotherapy can affect healthy cells as well as cancer  cells. Report any side effects. Continue your course of treatment even though you feel ill unless your care team tells you to stop. This medication can cause serious side effects. To reduce the risk, your care team may give you other medications to take before receiving this one. Be sure to follow the directions from your care team. This medication may affect your coordination, reaction time, or judgement. Do not drive or operate machinery until you know how this medication affects you. Sit up or stand slowly to reduce the risk of dizzy or fainting spells. Drinking alcohol with this medication can increase the risk of these side effects. This medication may increase your risk of getting an infection. Call your care team for advice if you get a fever, chills, sore throat, or other symptoms of a cold or flu. Do not treat yourself. Try to avoid being around people who are sick. Avoid taking medications that contain aspirin, acetaminophen, ibuprofen, naproxen, or ketoprofen unless instructed by your care team. These medications may hide a fever. This medication may increase your risk to bruise or bleed. Call your care team if you notice any unusual bleeding. Be careful brushing or flossing your teeth or using a toothpick because you may get an infection or bleed more easily. If you have any dental work done, tell your dentist you are receiving this medication. Talk to your care team if you  or your partner are pregnant or think either of you might be pregnant. This medication can cause serious birth defects if taken during pregnancy and for 6 months after the last dose. You will need a negative pregnancy test before starting this medication. Contraception is recommended while taking this medication and for 6 months after the last dose. Your care team can help you find the option that works for you. Do not father a child while taking this medication and for 3 months after the last dose. Use a condom for  contraception during this time period. Do not breastfeed while taking this medication and for 7 days after the last dose. This medication may cause infertility. Talk to your care team if you are concerned about your fertility. What side effects may I notice from receiving this medication? Side effects that you should report to your care team as soon as possible: Allergic reactions--skin rash, itching, hives, swelling of the face, lips, tongue, or throat Dry cough, shortness of breath or trouble breathing Increased saliva or tears, increased sweating, stomach cramping, diarrhea, small pupils, unusual weakness or fatigue, slow heartbeat Infection--fever, chills, cough, sore throat, wounds that don't heal, pain or trouble when passing urine, general feeling of discomfort or being unwell Kidney injury--decrease in the amount of urine, swelling of the ankles, hands, or feet Low red blood cell level--unusual weakness or fatigue, dizziness, headache, trouble breathing Severe or prolonged diarrhea Unusual bruising or bleeding Side effects that usually do not require medical attention (report to your care team if they continue or are bothersome): Constipation Diarrhea Hair loss Loss of appetite Nausea Stomach pain This list may not describe all possible side effects. Call your doctor for medical advice about side effects. You may report side effects to FDA at 1-800-FDA-1088. Where should I keep my medication? This medication is given in a hospital or clinic. It will not be stored at home. NOTE: This sheet is a summary. It may not cover all possible information. If you have questions about this medicine, talk to your doctor, pharmacist, or health care provider.  2023 Elsevier/Gold Standard (2021-05-31 00:00:00)  Leucovorin Injection What is this medication? LEUCOVORIN (loo koe VOR in) prevents side effects from certain medications, such as methotrexate. It works by increasing folate levels. This  helps protect healthy cells in your body. It may also be used to treat anemia caused by low levels of folate. It can also be used with fluorouracil, a type of chemotherapy, to treat colorectal cancer. It works by increasing the effects of fluorouracil in the body. This medicine may be used for other purposes; ask your health care provider or pharmacist if you have questions. What should I tell my care team before I take this medication? They need to know if you have any of these conditions: Anemia from low levels of vitamin B12 in the blood An unusual or allergic reaction to leucovorin, folic acid, other medications, foods, dyes, or preservatives Pregnant or trying to get pregnant Breastfeeding How should I use this medication? This medication is injected into a vein or a muscle. It is given by your care team in a hospital or clinic setting. Talk to your care team about the use of this medication in children. Special care may be needed. Overdosage: If you think you have taken too much of this medicine contact a poison control center or emergency room at once. NOTE: This medicine is only for you. Do not share this medicine with others. What if I miss a  dose? Keep appointments for follow-up doses. It is important not to miss your dose. Call your care team if you are unable to keep an appointment. What may interact with this medication? Capecitabine Fluorouracil Phenobarbital Phenytoin Primidone Trimethoprim;sulfamethoxazole This list may not describe all possible interactions. Give your health care provider a list of all the medicines, herbs, non-prescription drugs, or dietary supplements you use. Also tell them if you smoke, drink alcohol, or use illegal drugs. Some items may interact with your medicine. What should I watch for while using this medication? Your condition will be monitored carefully while you are receiving this medication. This medication may increase the side effects of  5-fluorouracil. Tell your care team if you have diarrhea or mouth sores that do not get better or that get worse. What side effects may I notice from receiving this medication? Side effects that you should report to your care team as soon as possible: Allergic reactions--skin rash, itching, hives, swelling of the face, lips, tongue, or throat This list may not describe all possible side effects. Call your doctor for medical advice about side effects. You may report side effects to FDA at 1-800-FDA-1088. Where should I keep my medication? This medication is given in a hospital or clinic. It will not be stored at home. NOTE: This sheet is a summary. It may not cover all possible information. If you have questions about this medicine, talk to your doctor, pharmacist, or health care provider.  2023 Elsevier/Gold Standard (2021-06-01 00:00:00)  Fluorouracil Injection What is this medication? FLUOROURACIL (flure oh YOOR a sil) treats some types of cancer. It works by slowing down the growth of cancer cells. This medicine may be used for other purposes; ask your health care provider or pharmacist if you have questions. COMMON BRAND NAME(S): Adrucil What should I tell my care team before I take this medication? They need to know if you have any of these conditions: Blood disorders Dihydropyrimidine dehydrogenase (DPD) deficiency Infection, such as chickenpox, cold sores, herpes Kidney disease Liver disease Poor nutrition Recent or ongoing radiation therapy An unusual or allergic reaction to fluorouracil, other medications, foods, dyes, or preservatives If you or your partner are pregnant or trying to get pregnant Breast-feeding How should I use this medication? This medication is injected into a vein. It is administered by your care team in a hospital or clinic setting. Talk to your care team about the use of this medication in children. Special care may be needed. Overdosage: If you think you  have taken too much of this medicine contact a poison control center or emergency room at once. NOTE: This medicine is only for you. Do not share this medicine with others. What if I miss a dose? Keep appointments for follow-up doses. It is important not to miss your dose. Call your care team if you are unable to keep an appointment. What may interact with this medication? Do not take this medication with any of the following: Live virus vaccines This medication may also interact with the following: Medications that treat or prevent blood clots, such as warfarin, enoxaparin, dalteparin This list may not describe all possible interactions. Give your health care provider a list of all the medicines, herbs, non-prescription drugs, or dietary supplements you use. Also tell them if you smoke, drink alcohol, or use illegal drugs. Some items may interact with your medicine. What should I watch for while using this medication? Your condition will be monitored carefully while you are receiving this medication. This medication  may make you feel generally unwell. This is not uncommon as chemotherapy can affect healthy cells as well as cancer cells. Report any side effects. Continue your course of treatment even though you feel ill unless your care team tells you to stop. In some cases, you may be given additional medications to help with side effects. Follow all directions for their use. This medication may increase your risk of getting an infection. Call your care team for advice if you get a fever, chills, sore throat, or other symptoms of a cold or flu. Do not treat yourself. Try to avoid being around people who are sick. This medication may increase your risk to bruise or bleed. Call your care team if you notice any unusual bleeding. Be careful brushing or flossing your teeth or using a toothpick because you may get an infection or bleed more easily. If you have any dental work done, tell your dentist you  are receiving this medication. Avoid taking medications that contain aspirin, acetaminophen, ibuprofen, naproxen, or ketoprofen unless instructed by your care team. These medications may hide a fever. Do not treat diarrhea with over the counter products. Contact your care team if you have diarrhea that lasts more than 2 days or if it is severe and watery. This medication can make you more sensitive to the sun. Keep out of the sun. If you cannot avoid being in the sun, wear protective clothing and sunscreen. Do not use sun lamps, tanning beds, or tanning booths. Talk to your care team if you or your partner wish to become pregnant or think you might be pregnant. This medication can cause serious birth defects if taken during pregnancy and for 3 months after the last dose. A reliable form of contraception is recommended while taking this medication and for 3 months after the last dose. Talk to your care team about effective forms of contraception. Do not father a child while taking this medication and for 3 months after the last dose. Use a condom while having sex during this time period. Do not breastfeed while taking this medication. This medication may cause infertility. Talk to your care team if you are concerned about your fertility. What side effects may I notice from receiving this medication? Side effects that you should report to your care team as soon as possible: Allergic reactions--skin rash, itching, hives, swelling of the face, lips, tongue, or throat Heart attack--pain or tightness in the chest, shoulders, arms, or jaw, nausea, shortness of breath, cold or clammy skin, feeling faint or lightheaded Heart failure--shortness of breath, swelling of the ankles, feet, or hands, sudden weight gain, unusual weakness or fatigue Heart rhythm changes--fast or irregular heartbeat, dizziness, feeling faint or lightheaded, chest pain, trouble breathing High ammonia level--unusual weakness or fatigue,  confusion, loss of appetite, nausea, vomiting, seizures Infection--fever, chills, cough, sore throat, wounds that don't heal, pain or trouble when passing urine, general feeling of discomfort or being unwell Low red blood cell level--unusual weakness or fatigue, dizziness, headache, trouble breathing Pain, tingling, or numbness in the hands or feet, muscle weakness, change in vision, confusion or trouble speaking, loss of balance or coordination, trouble walking, seizures Redness, swelling, and blistering of the skin over hands and feet Severe or prolonged diarrhea Unusual bruising or bleeding Side effects that usually do not require medical attention (report to your care team if they continue or are bothersome): Dry skin Headache Increased tears Nausea Pain, redness, or swelling with sores inside the mouth or throat Sensitivity to  light Vomiting This list may not describe all possible side effects. Call your doctor for medical advice about side effects. You may report side effects to FDA at 1-800-FDA-1088. Where should I keep my medication? This medication is given in a hospital or clinic. It will not be stored at home. NOTE: This sheet is a summary. It may not cover all possible information. If you have questions about this medicine, talk to your doctor, pharmacist, or health care provider.  2023 Elsevier/Gold Standard (2021-05-29 00:00:00)  The chemotherapy medication bag should finish at 46 hours, 96 hours, or 7 days. For example, if your pump is scheduled for 46 hours and it was put on at 4:00 p.m., it should finish at 2:00 p.m. the day it is scheduled to come off regardless of your appointment time.     Estimated time to finish at 11:00 a.m. on Wednesday 11/07/2021.   If the display on your pump reads "Low Volume" and it is beeping, take the batteries out of the pump and come to the cancer center for it to be taken off.   If the pump alarms go off prior to the pump reading "Low  Volume" then call [phone] and someone can assist you.  If the plunger comes out and the chemotherapy medication is leaking out, please use your home chemo spill kit to clean up the spill. Do NOT use paper towels or other household products.  If you have problems or questions regarding your pump, please call either [phone] (24 hours a day) or the cancer center Monday-Friday 8:00 a.m.- 4:30 p.m. at the clinic number and we will assist you. If you are unable to get assistance, then go to the nearest Emergency Department and ask the staff to contact the IV team for assistance.

## 2021-11-05 NOTE — Progress Notes (Signed)
Requested information faxed to [facility] at [phone]   Images and slides to be sent via [employer]

## 2021-11-05 NOTE — Telephone Encounter (Signed)
CRITICAL VALUE STICKER  CRITICAL VALUE: Total Bili 4  RECEIVER (on-site recipient of call): Wynee Matarazzo   DATE & TIME NOTIFIED: 11/05/21  MESSENGER (representative from lab):  MD NOTIFIED: MD Benay Spice  TIME OF NOTIFICATION:   RESPONSE: Ok to treat with abnormal labs

## 2021-11-05 NOTE — Progress Notes (Signed)
Patient seen by Dr. Benay Spice today  Vitals are within treatment parameters.  Labs reviewed by Dr. Benay Spice and are not all within treatment parameters. Bili 4.0 today, Calcium 7.5, per MD Benay Spice, ok to treat.   Per physician team, patient is ready for treatment and there are NO modifications to the treatment plan.

## 2021-11-05 NOTE — Progress Notes (Signed)
[facility] OFFICE PROGRESS NOTE   Diagnosis: Cholangiocarcinoma  INTERVAL HISTORY:   Mr. Copes completed another cycle of FOLFIRI on 10/22/2021.  He reports nausea and vomiting for 3 days following chemotherapy.  Compazine helps the nausea.  No diarrhea or mouth sores. He underwent placement of a peritoneal drainage catheter 10/31/2021.  He is draining 1 L daily.  There is leakage around the catheter site. He complains of increased anxiety.  He has anxiety despite Prozac and Xanax.  He uses Ativan intermittently.  Objective:  Vital signs in last 24 hours:  Blood pressure 133/88, pulse 78, temperature 98.1 F (36.7 C), temperature source Oral, resp. rate 18, height $RemoveBe'6\' 2"'wTLjcLvwK$  (1.88 m), weight 206 lb 3.2 oz (93.5 kg), SpO2 100 %.    HEENT: No thrush or ulcers Resp: Lungs clear bilaterally Cardio: Regular rate and rhythm GI: The liver is palpable in the right upper abdomen.  The abdomen is mildly distended.  Left lower abdomen peritoneal drain with a gauze dressing soaked with peritoneal fluid. Vascular: No leg edema Skin: Jaundice  Portacath/PICC-without erythema  Lab Results:  Lab Results  Component Value Date   WBC 8.6 11/05/2021   HGB 13.0 11/05/2021   HCT 37.3 (L) 11/05/2021   MCV 94.9 11/05/2021   PLT 165 11/05/2021   NEUTROABS 5.6 11/05/2021    CMP  Lab Results  Component Value Date   NA 120 (L) 11/05/2021   K 4.8 11/05/2021   CL 90 (L) 11/05/2021   CO2 23 11/05/2021   GLUCOSE 158 (H) 11/05/2021   BUN 19 11/05/2021   CREATININE 0.81 11/05/2021   CALCIUM 7.5 (L) 11/05/2021   PROT 5.6 (L) 11/05/2021   ALBUMIN 2.3 (L) 11/05/2021   AST 110 (H) 11/05/2021   ALT 37 11/05/2021   ALKPHOS 441 (H) 11/05/2021   BILITOT 4.0 (HH) 11/05/2021   GFRNONAA >60 11/05/2021   GFRAA 127 01/11/2020    Lab Results  Component Value Date   CEA1 2.15 01/14/2020   [id number] 209 (H) 09/24/2021    Medications: I have reviewed the patient's current  medications.   Assessment/Plan: Cholangiocarcinoma  multiple liver masses and abdominal lymphadenopathy CTs 01/13/2020-rounded hypodense mass appears to arise from the pancreas neck, multiple rim-enhancing masses in the liver, primarily left liver with segmental dilation of the left lobe Intermatic bile ducts, ill-defined hypodensity the central liver with effacement of the left portal vein, enlarged portacaval and retroperitoneal lymph nodes Ultrasound-guided biopsy of the left liver lesion 01/19/2020-adenocarcinoma, cytokeratin 7+, MSS, tumor mutation burden 1, IDH1 R132C MRI abdomen 01/31/2020-poorly marginated central liver mass, multiple smaller similar satellite liver masses, extrinsic mass-effect at the biliary hilum with intrahepatic biliary ductal dilatation throughout the left liver with mild Intermatic dilatation the superior right liver, no pancreas mass or ductal dilatation, normal spleen size, numerous enlarged enhancing lymph nodes at the porta hepatis, peripancreatic, portacaval, aortocaval, left periaortic chains Cycle 1 gemcitabine/cisplatin 02/04/2020 Cycle 2 gemcitabine/cisplatin 02/28/2020 Cycle 3 gemcitabine/cisplatin 03/20/2020 CT abdomen/pelvis 03/31/2020-mild decrease in central liver tumor, mild decrease in size of liver metastases and upper abdominal adenopathy, new splenomegaly Cycle 4 gemcitabine/cisplatin 04/10/2020 Cycle 5 gemcitabine/cisplatin 05/01/2020 Cycle 6 gemcitabine/cisplatin plus durvalumab 05/22/2020 CTs 06/06/2020- dominant central liver mass mildly enlarged, other liver lesions and abdominal adenopathy is stable, no evidence of metastatic disease to the chest Cycle 7 gemcitabine/cisplatin plus Durvalumab 06/12/2020 Cycle 8 gemcitabine/cisplatin plus Durvalumab 06/30/2020 Cycle 9 gemcitabine/cisplatin plus Durvalumab 07/31/2020 CT abdomen/pelvis 08/20/2020-decreased size of dominant central liver mass, slight increase in size and stable additional  liver lesions, stable  portacaval node Cycle 10 gemcitabine/cisplatin plus Durvalumab 08/21/2020 Cycle 11 gemcitabine/cisplatin plus Durvalumab 09/11/2020 Cycle 12 gemcitabine/cisplatin plus Durvalumab 10/02/2020 Cycle 13 gemcitabine/cisplatin plus Durvalumab 10/23/2020 CT abdomen/pelvis 11/10/2020-no change in dominant central liver mass and additional liver lesions, stable upper retroperitoneal adenopathy Cycle 14 gemcitabine/cisplatin plus Durvalumab 11/13/2020 Cycle 15 gemcitabine/cisplatin plus Durvalumab 12/04/2020 Cycle 15 gemcitabine/cisplatin plus Durvalumab 01/08/2021 Cycle 16 gemcitabine/cisplatin plus Durvalumab 01/30/2021-cisplatin held from day 1 and day 8 secondary to neuropathy CTs 02/15/2021-slight increase in size of central liver mass, progressive metastatic lesions in the right liver, stable to slightly decreased periportal lymph nodes, progression of splenomegaly, chronically thrombosed left portal vein Cycle 1 FOLFOX 03/05/2021 Cycle 2 FOLFOX 03/19/2021, oxaliplatin dose reduced due to thrombocytopenia Cycle 3 FOLFOX 04/02/2021 Cycle 4 FOLFOX 04/16/2021 Cycle 5 FOLFOX 04/30/2021 CTs 05/10/2021-new and enlarging liver metastases, chronic left lobe biliary dilatation, chronic left portal vein thrombosis, stable porta hepatis adenopathy, splenomegaly, no evidence of metastatic disease to the chest Ivosidenib 05/21/2021 CT 07/13/2021-increase in size of periportal node, stable retroperitoneal nodes, dominant left hepatic mass-stable, increased necrosis of other liver lesions, some have increased in size Ivosidenib continued CT abdomen/pelvis 09/06/2021-progression of ascites, progression of liver metastases, stable necrotic lymphadenopathy in the periportal region Ivosedinib discontinued 09/07/2021 Cycle 1 FOLFIRI 09/24/2021 Cycle 2 held on 10/10/2021 due to neutropenia, elevated bilirubin Cycle 2 FOLFIRI 10/22/2021 Cycle 3 FOLFIRI 11/05/2021   Cough-likely related to diaphragmatic irritation from #1 Anorexia/weight  loss Hypercalcemia-likely hypercalcemia malignancy, status post intravenous hydration and Zometa 01/14/2020 Diabetes Hyperlipidemia Hypertension History of peripheral neuropathy secondary to diabetes Severe back pain following Fulphila-oxycodone prescribed Thrombocytopenia secondary to chemotherapy-gemcitabine held with day 1 cycle 8 and then dose reduced COVID-19 infection 07/22/2020, 12/25/2020 Cisplatin induced peripheral neuropathy      Disposition: Mr. Denio has metastatic cholangiocarcinoma.  He has completed 2 treatments with FOLFIRI.  Cycle 2 was complicated by delayed nausea and vomiting.  Emend will be added with cycle 3.  The bilirubin remains elevated, but has not changed significantly since he began FOLFIRI 09/24/2021.  The chemotherapy has been dose reduced.  He plans to contact his primary provider regarding management of anxiety.  He will continue Prozac and as needed alprazolam/lorazepam.  I refilled his prescription for lorazepam.  He knows to not take the alprazolam and lorazepam at the same time.  Mr. Troung will return for an office visit and chemotherapy in 2 weeks.  He will undergo a restaging CT after cycle 5 FOLFIRI.  We will contact interventional radiology for evaluation of the leakage surrounding the peritoneal drain.    Betsy Coder, MD  11/05/2021  9:28 AM

## 2021-11-06 ENCOUNTER — Ambulatory Visit (HOSPITAL_COMMUNITY)
Admission: RE | Admit: 2021-11-06 | Discharge: 2021-11-06 | Disposition: A | Discharge status: Home / Self Care | Payer: No Typology Code available for payment source | Source: Ambulatory Visit | Attending: Oncology | Admitting: Oncology

## 2021-11-06 ENCOUNTER — Other Ambulatory Visit (HOSPITAL_BASED_OUTPATIENT_CLINIC_OR_DEPARTMENT_OTHER): Payer: Self-pay

## 2021-11-06 ENCOUNTER — Encounter: Payer: Self-pay | Admitting: Oncology

## 2021-11-06 ENCOUNTER — Encounter: Payer: Self-pay | Admitting: *Deleted

## 2021-11-06 ENCOUNTER — Other Ambulatory Visit: Payer: Self-pay | Admitting: Oncology

## 2021-11-06 DIAGNOSIS — C221 Intrahepatic bile duct carcinoma: Secondary | ICD-10-CM | POA: Insufficient documentation

## 2021-11-06 DIAGNOSIS — R180 Malignant ascites: Secondary | ICD-10-CM | POA: Diagnosis not present

## 2021-11-06 HISTORY — PX: IR PARACENTESIS: IMG2679

## 2021-11-06 MED ORDER — TRESIBA FLEXTOUCH 100 UNIT/ML ~~LOC~~ SOPN
PEN_INJECTOR | SUBCUTANEOUS | 6 refills | Status: AC
  Filled 2021-11-06: qty 6, 60d supply, fill #0

## 2021-11-06 MED ORDER — LIDOCAINE HCL 1 % IJ SOLN
INTRAMUSCULAR | Status: AC
  Filled 2021-11-06: qty 20

## 2021-11-06 MED ORDER — FREESTYLE LITE W/DEVICE KIT
PACK | 0 refills | Status: AC
  Filled 2021-11-06: qty 1, 1d supply, fill #0

## 2021-11-06 MED ORDER — UNIFINE PENTIPS 32G X 4 MM MISC
5 refills | Status: AC
  Filled 2021-11-06: qty 100, 30d supply, fill #0

## 2021-11-06 NOTE — Procedures (Signed)
46 y.o. male outpatient. History of cholangiocarcinoma with recurrent malignant ascites s/p abdominal pleurex catheter placed on 9.27.23 by IR. Patient and family reporting persistent leaking around the Pleurx catheter exit site.  Patient seen in IR for further evaluation.  Exit site itself appears unremarkable with no erythema or edema noted.  Suture appears to be intact.  Cuff can be palpated in an appropriate position.  Ultrasound performed at bedside shows moderate amount of ascites.  Using the Pleurx catheter adapter a therapeutic paracentesis performed yielding 4 liters of straw-colored colored fluid. No immediate complications. EBL is none.  At the end of the procedure a pursestring suture was placed at the exit site by interventional radiology attending Dr. Wanda Plump.  Mir. The patient tolerated the procedure.

## 2021-11-06 NOTE — Progress Notes (Signed)
Faxed updated orders for PleurX kits to [facility]

## 2021-11-07 ENCOUNTER — Inpatient Hospital Stay: Payer: No Typology Code available for payment source

## 2021-11-07 VITALS — BP 128/79 | HR 81 | Temp 98.2°F | Resp 18

## 2021-11-07 DIAGNOSIS — C221 Intrahepatic bile duct carcinoma: Secondary | ICD-10-CM

## 2021-11-07 DIAGNOSIS — Z5111 Encounter for antineoplastic chemotherapy: Secondary | ICD-10-CM | POA: Diagnosis not present

## 2021-11-07 MED ORDER — SODIUM CHLORIDE 0.9% FLUSH
10.0000 mL | INTRAVENOUS | Status: DC | PRN
  Administered 2021-11-07: 10 mL

## 2021-11-07 MED ORDER — PEGFILGRASTIM-CBQV 6 MG/0.6ML ~~LOC~~ SOSY
6.0000 mg | PREFILLED_SYRINGE | Freq: Once | SUBCUTANEOUS | Status: AC
  Administered 2021-11-07: 6 mg via SUBCUTANEOUS
  Filled 2021-11-07: qty 0.6

## 2021-11-07 MED ORDER — HEPARIN SOD (PORK) LOCK FLUSH 100 UNIT/ML IV SOLN
500.0000 [IU] | Freq: Once | INTRAVENOUS | Status: AC | PRN
  Administered 2021-11-07: 500 [IU]

## 2021-11-14 ENCOUNTER — Encounter: Payer: Self-pay | Admitting: *Deleted

## 2021-11-15 ENCOUNTER — Inpatient Hospital Stay (HOSPITAL_BASED_OUTPATIENT_CLINIC_OR_DEPARTMENT_OTHER)
Admission: EM | Admit: 2021-11-15 | Discharge: 2021-11-19 | DRG: 368 | Disposition: A | Discharge status: Home / Self Care | Payer: No Typology Code available for payment source | Attending: Internal Medicine | Admitting: Internal Medicine

## 2021-11-15 ENCOUNTER — Encounter (HOSPITAL_BASED_OUTPATIENT_CLINIC_OR_DEPARTMENT_OTHER): Payer: Self-pay | Admitting: Emergency Medicine

## 2021-11-15 ENCOUNTER — Encounter (HOSPITAL_COMMUNITY): Payer: Self-pay

## 2021-11-15 ENCOUNTER — Inpatient Hospital Stay (HOSPITAL_COMMUNITY): Payer: No Typology Code available for payment source

## 2021-11-15 ENCOUNTER — Emergency Department (HOSPITAL_BASED_OUTPATIENT_CLINIC_OR_DEPARTMENT_OTHER): Payer: No Typology Code available for payment source | Admitting: Radiology

## 2021-11-15 ENCOUNTER — Other Ambulatory Visit: Payer: Self-pay

## 2021-11-15 DIAGNOSIS — B370 Candidal stomatitis: Secondary | ICD-10-CM | POA: Diagnosis present

## 2021-11-15 DIAGNOSIS — D709 Neutropenia, unspecified: Secondary | ICD-10-CM

## 2021-11-15 DIAGNOSIS — N179 Acute kidney failure, unspecified: Secondary | ICD-10-CM | POA: Diagnosis present

## 2021-11-15 DIAGNOSIS — Z1152 Encounter for screening for COVID-19: Secondary | ICD-10-CM

## 2021-11-15 DIAGNOSIS — Z794 Long term (current) use of insulin: Secondary | ICD-10-CM | POA: Diagnosis not present

## 2021-11-15 DIAGNOSIS — E871 Hypo-osmolality and hyponatremia: Secondary | ICD-10-CM

## 2021-11-15 DIAGNOSIS — R799 Abnormal finding of blood chemistry, unspecified: Secondary | ICD-10-CM

## 2021-11-15 DIAGNOSIS — T451X5A Adverse effect of antineoplastic and immunosuppressive drugs, initial encounter: Secondary | ICD-10-CM | POA: Diagnosis present

## 2021-11-15 DIAGNOSIS — E43 Unspecified severe protein-calorie malnutrition: Secondary | ICD-10-CM | POA: Diagnosis present

## 2021-11-15 DIAGNOSIS — Z87891 Personal history of nicotine dependence: Secondary | ICD-10-CM | POA: Diagnosis not present

## 2021-11-15 DIAGNOSIS — I10 Essential (primary) hypertension: Secondary | ICD-10-CM | POA: Diagnosis present

## 2021-11-15 DIAGNOSIS — E113299 Type 2 diabetes mellitus with mild nonproliferative diabetic retinopathy without macular edema, unspecified eye: Secondary | ICD-10-CM | POA: Diagnosis present

## 2021-11-15 DIAGNOSIS — R627 Adult failure to thrive: Secondary | ICD-10-CM | POA: Diagnosis present

## 2021-11-15 DIAGNOSIS — K5900 Constipation, unspecified: Secondary | ICD-10-CM | POA: Diagnosis present

## 2021-11-15 DIAGNOSIS — E1165 Type 2 diabetes mellitus with hyperglycemia: Secondary | ICD-10-CM | POA: Diagnosis not present

## 2021-11-15 DIAGNOSIS — B379 Candidiasis, unspecified: Principal | ICD-10-CM

## 2021-11-15 DIAGNOSIS — C772 Secondary and unspecified malignant neoplasm of intra-abdominal lymph nodes: Secondary | ICD-10-CM | POA: Diagnosis present

## 2021-11-15 DIAGNOSIS — E860 Dehydration: Secondary | ICD-10-CM | POA: Diagnosis present

## 2021-11-15 DIAGNOSIS — E861 Hypovolemia: Secondary | ICD-10-CM | POA: Diagnosis present

## 2021-11-15 DIAGNOSIS — D701 Agranulocytosis secondary to cancer chemotherapy: Secondary | ICD-10-CM | POA: Diagnosis present

## 2021-11-15 DIAGNOSIS — R7989 Other specified abnormal findings of blood chemistry: Secondary | ICD-10-CM

## 2021-11-15 DIAGNOSIS — F419 Anxiety disorder, unspecified: Secondary | ICD-10-CM | POA: Diagnosis present

## 2021-11-15 DIAGNOSIS — E875 Hyperkalemia: Secondary | ICD-10-CM | POA: Diagnosis present

## 2021-11-15 DIAGNOSIS — K1230 Oral mucositis (ulcerative), unspecified: Secondary | ICD-10-CM | POA: Diagnosis present

## 2021-11-15 DIAGNOSIS — C787 Secondary malignant neoplasm of liver and intrahepatic bile duct: Secondary | ICD-10-CM | POA: Diagnosis present

## 2021-11-15 DIAGNOSIS — Z79899 Other long term (current) drug therapy: Secondary | ICD-10-CM

## 2021-11-15 DIAGNOSIS — R188 Other ascites: Secondary | ICD-10-CM

## 2021-11-15 DIAGNOSIS — D649 Anemia, unspecified: Secondary | ICD-10-CM | POA: Diagnosis present

## 2021-11-15 DIAGNOSIS — C221 Intrahepatic bile duct carcinoma: Secondary | ICD-10-CM | POA: Diagnosis present

## 2021-11-15 DIAGNOSIS — R180 Malignant ascites: Secondary | ICD-10-CM | POA: Diagnosis present

## 2021-11-15 DIAGNOSIS — E785 Hyperlipidemia, unspecified: Secondary | ICD-10-CM | POA: Diagnosis present

## 2021-11-15 DIAGNOSIS — R1310 Dysphagia, unspecified: Secondary | ICD-10-CM

## 2021-11-15 DIAGNOSIS — E119 Type 2 diabetes mellitus without complications: Secondary | ICD-10-CM

## 2021-11-15 DIAGNOSIS — Z8249 Family history of ischemic heart disease and other diseases of the circulatory system: Secondary | ICD-10-CM

## 2021-11-15 DIAGNOSIS — Z888 Allergy status to other drugs, medicaments and biological substances status: Secondary | ICD-10-CM

## 2021-11-15 DIAGNOSIS — E1142 Type 2 diabetes mellitus with diabetic polyneuropathy: Secondary | ICD-10-CM | POA: Diagnosis present

## 2021-11-15 DIAGNOSIS — B3781 Candidal esophagitis: Secondary | ICD-10-CM | POA: Diagnosis not present

## 2021-11-15 DIAGNOSIS — Z6823 Body mass index (BMI) 23.0-23.9, adult: Secondary | ICD-10-CM

## 2021-11-15 LAB — RENAL FUNCTION PANEL
Albumin: 2 g/dL — ABNORMAL LOW (ref 3.5–5.0)
Anion gap: 9 (ref 5–15)
BUN: 63 mg/dL — ABNORMAL HIGH (ref 6–20)
CO2: 22 mmol/L (ref 22–32)
Calcium: 10 mg/dL (ref 8.9–10.3)
Chloride: 90 mmol/L — ABNORMAL LOW (ref 98–111)
Creatinine, Ser: 1.54 mg/dL — ABNORMAL HIGH (ref 0.61–1.24)
GFR, Estimated: 56 mL/min — ABNORMAL LOW (ref >60)
Glucose, Bld: 117 mg/dL — ABNORMAL HIGH (ref 70–99)
Phosphorus: 5 mg/dL — ABNORMAL HIGH (ref 2.5–4.6)
Potassium: 6.2 mmol/L — ABNORMAL HIGH (ref 3.5–5.1)
Sodium: 121 mmol/L — ABNORMAL LOW (ref 135–145)

## 2021-11-15 LAB — CBC WITH DIFFERENTIAL/PLATELET
Abs Immature Granulocytes: 0.28 10*3/uL — ABNORMAL HIGH (ref 0.00–0.07)
Basophils Absolute: 0 10*3/uL (ref 0.0–0.1)
Basophils Relative: 1 %
Eosinophils Absolute: 0 10*3/uL (ref 0.0–0.5)
Eosinophils Relative: 1 %
HCT: 37.9 % — ABNORMAL LOW (ref 39.0–52.0)
Hemoglobin: 13.8 g/dL (ref 13.0–17.0)
Immature Granulocytes: 13 %
Lymphocytes Relative: 22 %
Lymphs Abs: 0.5 10*3/uL — ABNORMAL LOW (ref 0.7–4.0)
MCH: 33.7 pg (ref 26.0–34.0)
MCHC: 36.4 g/dL — ABNORMAL HIGH (ref 30.0–36.0)
MCV: 92.7 fL (ref 80.0–100.0)
Monocytes Absolute: 0.8 10*3/uL (ref 0.1–1.0)
Monocytes Relative: 36 %
Neutro Abs: 0.6 10*3/uL — ABNORMAL LOW (ref 1.7–7.7)
Neutrophils Relative %: 27 %
Platelets: 109 10*3/uL — ABNORMAL LOW (ref 150–400)
RBC: 4.09 MIL/uL — ABNORMAL LOW (ref 4.22–5.81)
RDW: 15.5 % (ref 11.5–15.5)
Smear Review: DECREASED
WBC: 2.2 10*3/uL — ABNORMAL LOW (ref 4.0–10.5)
nRBC: 0 % (ref 0.0–0.2)

## 2021-11-15 LAB — HEMOGLOBIN A1C
Hgb A1c MFr Bld: 5.7 % — ABNORMAL HIGH (ref 4.8–5.6)
Mean Plasma Glucose: 116.89 mg/dL

## 2021-11-15 LAB — COMPREHENSIVE METABOLIC PANEL
ALT: 56 U/L — ABNORMAL HIGH (ref 0–44)
AST: 91 U/L — ABNORMAL HIGH (ref 15–41)
Albumin: 2.5 g/dL — ABNORMAL LOW (ref 3.5–5.0)
Alkaline Phosphatase: 438 U/L — ABNORMAL HIGH (ref 38–126)
Anion gap: 9 (ref 5–15)
BUN: 51 mg/dL — ABNORMAL HIGH (ref 6–20)
CO2: 24 mmol/L (ref 22–32)
Calcium: 11 mg/dL — ABNORMAL HIGH (ref 8.9–10.3)
Chloride: 86 mmol/L — ABNORMAL LOW (ref 98–111)
Creatinine, Ser: 1.2 mg/dL (ref 0.61–1.24)
GFR, Estimated: >60 mL/min (ref >60)
Glucose, Bld: 144 mg/dL — ABNORMAL HIGH (ref 70–99)
Potassium: 5.6 mmol/L — ABNORMAL HIGH (ref 3.5–5.1)
Sodium: 119 mmol/L — CL (ref 135–145)
Total Bilirubin: 8.8 mg/dL — ABNORMAL HIGH (ref 0.3–1.2)
Total Protein: 6.1 g/dL — ABNORMAL LOW (ref 6.5–8.1)

## 2021-11-15 LAB — SODIUM, URINE, RANDOM: Sodium, Ur: <10 mmol/L

## 2021-11-15 LAB — RESP PANEL BY RT-PCR (RSV, FLU A&B, COVID)  RVPGX2
Influenza A by PCR: NEGATIVE
Influenza B by PCR: NEGATIVE
Resp Syncytial Virus by PCR: NEGATIVE
SARS Coronavirus 2 by RT PCR: NEGATIVE

## 2021-11-15 LAB — GROUP A STREP BY PCR: Group A Strep by PCR: NOT DETECTED

## 2021-11-15 MED ORDER — SODIUM CHLORIDE 0.9 % IV SOLN
150.0000 mg | INTRAVENOUS | Status: DC
  Administered 2021-11-15 – 2021-11-17 (×3): 150 mg via INTRAVENOUS
  Filled 2021-11-15 (×3): qty 7.5

## 2021-11-15 MED ORDER — MAGIC MOUTHWASH
15.0000 mL | Freq: Once | ORAL | Status: AC
  Administered 2021-11-15: 15 mL via ORAL
  Filled 2021-11-15: qty 15

## 2021-11-15 MED ORDER — SODIUM CHLORIDE 0.9 % IV SOLN: Freq: Once | INTRAVENOUS | Status: AC

## 2021-11-15 MED ORDER — LIDOCAINE VISCOUS HCL 2 % MT SOLN
15.0000 mL | Freq: Once | OROMUCOSAL | Status: AC
  Administered 2021-11-15: 15 mL via OROMUCOSAL
  Filled 2021-11-15: qty 15

## 2021-11-15 MED ORDER — ONDANSETRON HCL 4 MG/2ML IJ SOLN: 4.0000 mg | Freq: Four times a day (QID) | INTRAMUSCULAR | Status: DC | PRN

## 2021-11-15 MED ORDER — SODIUM CHLORIDE 0.9 % IV SOLN: INTRAVENOUS | Status: DC

## 2021-11-15 MED ORDER — LORAZEPAM 2 MG/ML IJ SOLN: 1.0000 mg | Freq: Four times a day (QID) | INTRAMUSCULAR | Status: DC | PRN

## 2021-11-15 MED ORDER — MAGIC MOUTHWASH W/LIDOCAINE
5.0000 mL | Freq: Three times a day (TID) | ORAL | Status: DC
  Administered 2021-11-15 (×2): 5 mL via ORAL
  Filled 2021-11-15 (×3): qty 5

## 2021-11-15 MED ORDER — SODIUM POLYSTYRENE SULFONATE 15 GM/60ML PO SUSP
15.0000 g | Freq: Once | ORAL | Status: AC
  Administered 2021-11-15: 15 g via ORAL
  Filled 2021-11-15: qty 60

## 2021-11-15 MED ORDER — INSULIN ASPART 100 UNIT/ML IJ SOLN: 0.0000 [IU] | Freq: Three times a day (TID) | INTRAMUSCULAR | Status: DC

## 2021-11-15 MED ORDER — ONDANSETRON HCL 4 MG/2ML IJ SOLN
4.0000 mg | Freq: Once | INTRAMUSCULAR | Status: AC
  Administered 2021-11-15: 4 mg via INTRAVENOUS

## 2021-11-15 MED ORDER — ONDANSETRON HCL 4 MG/2ML IJ SOLN: 4.0000 mg | Freq: Once | INTRAMUSCULAR | Status: DC

## 2021-11-15 MED ORDER — TRAMADOL HCL 50 MG PO TABS: 50.0000 mg | ORAL_TABLET | Freq: Three times a day (TID) | ORAL | Status: DC | PRN

## 2021-11-15 NOTE — H&P (Signed)
History and Physical    Patient: Matthew Osborne [id number]:[id number] DOB: 11-02-1975 DOA: 11/15/2021 DOS: the patient was seen and examined on 11/15/2021 PCP: Susy Frizzle, MD  Patient coming from: Home  Chief Complaint:  Chief Complaint  Patient presents with   Cough   HPI: Matthew Osborne is a 46 y.o. male with medical history significant of cholangiocarcinoma, hypercalcemia, anxiety, DM2. Presenting with sore throat and cough. He started having sore throat about 4 day ago. No sick contacts. No fevers. It was painful to swallow. So he's had decrease PO intake during this time. He began to develop cough the next day. At first it was non-productive, but it has transitioned to a frothy productive cough. His output has been copious over the last day. When his symptoms did not improve this morning, he decided to come to the ED for evaluation. He denies any other aggravating or alleviating factors.   Review of Systems: As mentioned in the history of present illness. All other systems reviewed and are negative. Past Medical History:  Diagnosis Date   Cancer ([place])    De Quervain's tenosynovitis, left 03/2011   Diabetes mellitus    NIDDM   Family history of breast cancer    Family history of Hodgkin's lymphoma    Hyperlipidemia    Hypertension    under control; has been on med. x 4 yrs.   Nonproliferative retinopathy due to secondary diabetes [facility])    Past Surgical History:  Procedure Laterality Date   CARPAL TUNNEL RELEASE     bilat.   DORSAL COMPARTMENT RELEASE  03/14/2011   Procedure: RELEASE DORSAL COMPARTMENT (DEQUERVAIN);  Surgeon: Paulene Floor, MD;  Location: [place];  Service: Orthopedics;  Laterality: Left;  left wrist APL and EPL tenosynovectomy and 1st dorsal compartment release   IR IMAGE GUIDED DRAINAGE PERCUT CATH  PERITONEAL RETROPERIT  10/31/2021   IR IMAGING GUIDED PORT INSERTION  02/01/2020   IR PARACENTESIS  09/10/2021   IR PARACENTESIS  09/21/2021    IR PARACENTESIS  09/27/2021   IR PARACENTESIS  10/03/2021   IR PARACENTESIS  10/12/2021   IR PARACENTESIS  10/19/2021   IR PARACENTESIS  10/25/2021   IR PARACENTESIS  11/06/2021   TONSILLECTOMY AND ADENOIDECTOMY  as a child   VASECTOMY     Social History:  reports that he quit smoking about 22 years ago. His smoking use included cigarettes. He has a 18.00 pack-year smoking history. He has never used smokeless tobacco. He reports that he does not drink alcohol and does not use drugs.  Allergies  Allergen Reactions   Niaspan [Niacin] Other (See Comments)    Flushing     Family History  Problem Relation Age of Onset   Cancer Other    Hyperlipidemia Other    Hypertension Brother    Asthma Son    Lung cancer Maternal Grandfather    Mesothelioma Maternal Grandfather        asbestos exposure   Rheum arthritis Maternal Grandmother    Breast cancer Maternal Grandmother 32   Hodgkin's lymphoma Mother 23       30 lb tumor removed from abdomen   Cirrhosis Paternal Grandmother    Other Maternal Great-grandfather        black lung, worked in [place] (MGF's father)    Prior to Admission medications   Medication Sig Start Date End Date Taking? Authorizing Provider  ALPRAZolam Duanne Moron) 0.5 MG tablet Take 1 tablet (0.5 mg total) by  mouth 3 (three) times daily as needed. 08/20/21  Yes Ladell Pier, MD  Continuous Blood Gluc Sensor (DEXCOM G6 SENSOR) MISC Use as directed to check blood sugar 5-6 times daily. 08/20/21  Yes Susy Frizzle, MD  Continuous Blood Gluc Transmit (DEXCOM G6 TRANSMITTER) MISC USE AS DIRECTED TO CHECK BLOOD SUGAR 5-6 TIMES DAILY *needs office visit* 11/05/21 11/05/22 Yes Susy Frizzle, MD  FLUoxetine (PROZAC) 20 MG capsule Take 1 capsule (20 mg total) by mouth daily. 09/18/21  Yes Susy Frizzle, MD  insulin degludec (TRESIBA FLEXTOUCH) 100 UNIT/ML FlexTouch Pen Inject 6 units under the skin once daily. 11/06/21  Yes   Insulin Disposable Pump (OMNIPOD 5 G6 POD, GEN  5,) MISC Use as directed every 48 hours 09/07/21  Yes   insulin lispro (HUMALOG) 100 UNIT/ML injection Inject 200 units/day under the skin via pump 05/15/21  Yes   Insulin Pen Needle (UNIFINE PENTIPS) 32G X 4 MM MISC Use as directed 11/06/21  Yes   Insulin Syringe 27G X 1/2" 0.5 ML MISC Use as directed to inject insulin SQ Q1D. 01/31/20  Yes Susy Frizzle, MD  Lancets Thin MISC Check BS BID 08/29/17  Yes Susy Frizzle, MD  lidocaine-prilocaine (EMLA) cream APPLY 1 APPLICATION TOPICALLY AS DIRECTED. APPLY TO PORT SITE 1-2 HOURS PRIOR TO STICK AND COVER WITH PLASTIC WRAP. 07/03/21 07/03/22 Yes Ladell Pier, MD  loratadine (CLARITIN) 10 MG tablet Take 10 mg by mouth as needed for allergies.   Yes [provider]  LORazepam (ATIVAN) 0.5 MG tablet Take 1 tablet (0.5 mg total) by mouth at bedtime as needed for anxiety. 11/05/21  Yes Ladell Pier, MD  prochlorperazine (COMPAZINE) 10 MG tablet TAKE 1 TABLET BY MOUTH EVERY 6 HOURS AS NEEDED FOR NAUSEA 10/09/21 10/09/22 Yes Ladell Pier, MD  Senna (SENOKOT LAXATIVE GUMMIES PO) Take 2 tablets by mouth daily at 12 noon.   Yes [provider]  spironolactone (ALDACTONE) 25 MG tablet Take 1 tablet (25 mg total) by mouth daily. 10/10/21  Yes Owens Shark, NP  blood glucose meter kit and supplies KIT Fasting prior to each meal three times a day 08/25/19   Ishmael Holter A, FNP  Blood Glucose Monitoring Suppl (FREESTYLE LITE) w/Device KIT Use as directed. 11/06/21     Glucagon (BAQSIMI TWO PACK) 3 MG/DOSE POWD as directed Nasally Once a day PRN 30 days Patient not taking: Reported on 11/05/2021 05/15/21     glucose blood test strip Dispense based on patient and insurance preference.  Use twice daily as directed. (FOR ICD-10 E11.65) 08/29/17   Susy Frizzle, MD  Hydrocod Polst-Chlorphen Polst (CHLORPHENIRAMINE-HYDROCODONE) 10-8 MG/5ML Take 5 milliliters by mouth every 12 (twelve) hours as needed for cough. Patient not taking: Reported on  11/05/2021 08/20/21   Ladell Pier, MD  oxyCODONE (OXY IR/ROXICODONE) 5 MG immediate release tablet TAKE 1 TABLET BY MOUTH EVERY 6 HOURS AS NEEDED FOR SEVERE PAIN. Patient not taking: Reported on 11/15/2021 10/22/21 04/20/22  Owens Shark, NP  traMADol (ULTRAM) 50 MG tablet TAKE 1 TABLET BY MOUTH EVERY 6 HOURS AS NEEDED FOR MODERATE PAIN (COUGH). Patient not taking: Reported on 11/05/2021 08/20/21 02/16/22  Ladell Pier, MD  TRUEPLUS LANCETS 33G MISC 1 each by Does not apply route 2 (two) times daily. 07/06/13   Susy Frizzle, MD     Physical Exam: Vitals:   11/15/21 1335 11/15/21 1400 11/15/21 1430 11/15/21 1500  BP: 119/78 119/71 115/72 103/72  Pulse: 92 87 86 95  Resp: _0 Temp: 97.8 F (36.6 C)     TempSrc: Oral     SpO2: 97% 95% 96% 95%  Weight:      Height:       General: 46 y.o. ill appearing male resting in bed in NAD Eyes: PERRL, normal sclera ENMT: Nares patent w/o discharge, orophaynx coated, dentition poor, ears w/o discharge/lesions/ulcers Neck: Supple, trachea midline Cardiovascular: RRR, +S1, S2, no m/g/r, equal pulses throughout Respiratory: CTABL, no w/r/r, normal WOB GI: BS+, NDNT, catheter in place, no organomegaly noted MSK: No e/c/c Neuro: A&O x 3, no focal deficits Psyc: Appropriate interaction and affect, calm/cooperative  Data Reviewed:  Results for orders placed or performed during the hospital encounter of 11/15/21 (from the past 24 hour(s))  Group A Strep by PCR     Status: None   Collection Time: 11/15/21  9:24 AM   Specimen: Throat; Sterile Swab  Result Value Ref Range   Group A Strep by PCR NOT DETECTED NOT DETECTED  Comprehensive metabolic panel     Status: Abnormal   Collection Time: 11/15/21  9:24 AM  Result Value Ref Range   Sodium 119 (LL) 135 - 145 mmol/L   Potassium 5.6 (H) 3.5 - 5.1 mmol/L   Chloride 86 (L) 98 - 111 mmol/L   CO2 24 22 - 32 mmol/L   Glucose, Bld 144 (H) 70 - 99 mg/dL   BUN 51 (H) 6 - 20 mg/dL    Creatinine, Ser 1.20 0.61 - 1.24 mg/dL   Calcium 11.0 (H) 8.9 - 10.3 mg/dL   Total Protein 6.1 (L) 6.5 - 8.1 g/dL   Albumin 2.5 (L) 3.5 - 5.0 g/dL   AST 91 (H) 15 - 41 U/L   ALT 56 (H) 0 - 44 U/L   Alkaline Phosphatase 438 (H) 38 - 126 U/L   Total Bilirubin 8.8 (H) 0.3 - 1.2 mg/dL   GFR, Estimated >60 >60 mL/min   Anion gap 9 5 - 15  CBC with Differential     Status: Abnormal   Collection Time: 11/15/21  9:24 AM  Result Value Ref Range   WBC 2.2 (L) 4.0 - 10.5 K/uL   RBC 4.09 (L) 4.22 - 5.81 MIL/uL   Hemoglobin 13.8 13.0 - 17.0 g/dL   HCT 37.9 (L) 39.0 - 52.0 %   MCV 92.7 80.0 - 100.0 fL   MCH 33.7 26.0 - 34.0 pg   MCHC 36.4 (H) 30.0 - 36.0 g/dL   RDW 15.5 11.5 - 15.5 %   Platelets 109 (L) 150 - 400 K/uL   nRBC 0.0 0.0 - 0.2 %   Neutrophils Relative % 27 %   Neutro Abs 0.6 (L) 1.7 - 7.7 K/uL   Lymphocytes Relative 22 %   Lymphs Abs 0.5 (L) 0.7 - 4.0 K/uL   Monocytes Relative 36 %   Monocytes Absolute 0.8 0.1 - 1.0 K/uL   Eosinophils Relative 1 %   Eosinophils Absolute 0.0 0.0 - 0.5 K/uL   Basophils Relative 1 %   Basophils Absolute 0.0 0.0 - 0.1 K/uL   WBC Morphology MILD LEFT SHIFT (1-5% METAS, OCC MYELO, OCC BANDS)    RBC Morphology MORPHOLOGY UNREMARKABLE    Smear Review PLATELETS APPEAR DECREASED    Immature Granulocytes 13 %   Abs Immature Granulocytes 0.28 (H) 0.00 - 0.07 K/uL  Resp panel by RT-PCR (RSV, Flu A&B, Covid)     Status: None   Collection Time: 11/15/21  10:00 AM  Result Value Ref Range   SARS Coronavirus 2 by RT PCR NEGATIVE NEGATIVE   Influenza A by PCR NEGATIVE NEGATIVE   Influenza B by PCR NEGATIVE NEGATIVE   Resp Syncytial Virus by PCR NEGATIVE NEGATIVE   *Note: Due to a large number of results and/or encounters for the requested time period, some results have not been displayed. A complete set of results can be found in Results Review.   CXR: Low lung volumes.  No acute cardiopulmonary abnormality.  Assessment and Plan: Esophageal  candidiasis     - admit to inpt, progressive     - continue magic mouthwash w/ lidocaine     - continue micafungin; watch liver fxn; appreciate ID recommendation     - [facility] onboard, appreciate assistance     - checking blood cultures as well     - fluids     - CLD advance as tolerated   Hyponatremia     - chronically low, but this has worsened     - check urine osm, urine Na+     - he's dehydrated, so start fluids     - watch q6H renal function panel; limit Na+ rise to 8 - 10 pts/day     - if not improving by AM, consider renal consult     - hold prozac  Hyperkalemia     - kayexalate x 1; trend K+  Hypercalcemia     - chronic, secondary to malignancy     - fluids  AKI     - check ab [place]     - watch nephrotoxins     - fluids  Cholangiocarcinoma w/ liver mets Chronic recurrent ascites, s/p pleurx catheter placement     - follows w/ Dr. Benay Spice; have made him aware of admission, he will see in AM     - wife reports that his catheter has been draining at insertion site; will as IR to eval  DM2     - A1c, SS1, glucose checks     - CLD for now  Neutropenia     - no fever     - checking bld cx     - no indication for abx right now; covering w/ micafungin for candidiasis   Anxiety     - PRN ativan  Advance Care Planning:   Code Status: FULL  Consults: Oncology (Dr. Benay Spice), [facility] (Dr. Randel Pigg); [facility] ID (Dr. Linus Salmons)  Family Communication: w/ wife at bedside  Severity of Illness: The appropriate patient status for this patient is INPATIENT. Inpatient status is judged to be reasonable and necessary in order to provide the required intensity of service to ensure the patient's safety. The patient's presenting symptoms, physical exam findings, and initial radiographic and laboratory data in the context of their chronic comorbidities is felt to place them at high risk for further clinical deterioration. Furthermore, it is not anticipated that the patient will be  medically stable for discharge from the hospital within 2 midnights of admission.   * I certify that at the point of admission it is my clinical judgment that the patient will require inpatient hospital care spanning beyond 2 midnights from the point of admission due to high intensity of service, high risk for further deterioration and high frequency of surveillance required.*  Author: Jonnie Finner, DO 11/15/2021 5:00 PM  For on call review [email or web address].

## 2021-11-15 NOTE — ED Triage Notes (Signed)
Pt arrives to ED with c/o cough, sore throat for over a week. Pt reports he is cough up phlegm and today he began to cough white frothy mucous.

## 2021-11-15 NOTE — Progress Notes (Signed)
Plan of Care Note for accepted transfer   Patient: Matthew Osborne MRN: [id number]   DOA: 11/15/2021  Facility requesting transfer: [facility] Requesting Provider: Dr. Matilde Sprang Reason for transfer: esophageal candidiasis  Facility course: 46 yo M with PMHx of cholangiocarcinoma, hypercalcemia, anxiety, DM2. Presenting with sore throat and increased secretions. W/u revealed esophageal candidiasis and hyponatremia. He was started on magic mouthwash/viscous lidocaine. EDP spoke with [facility]. They will follow while he is here.    Plan of care: The patient is accepted for admission to Progressive unit, at [facility]..  While holding at [facility], medical decision making responsibilities remain with the EDP. Upon arrival to [facility], [facility] will assume care. Thank you.   Author: Jonnie Finner, DO 11/15/2021  Check [email or web address] for on-call coverage.  Nursing staff, Please call [facility] number on Amion as soon as patient's arrival, so appropriate admitting provider can evaluate the pt.

## 2021-11-15 NOTE — ED Provider Notes (Signed)
[facility] EMERGENCY DEPT Provider Note  CSN: [id number] Arrival date & time: 11/15/21 0825  Chief Complaint(s) Cough  HPI Ronie A Hammonds is a 46 y.o. male with PMH cholangiocarcinoma currently on active chemotherapy, T2DM who presents the emergency department for evaluation of cough, sore throat.  History obtained from patient's wife Karen who states that he has started to cough up copious amounts of nonbloody phlegm and is complaining of severe sore throat that is making it difficult for the patient to tolerate p.o.  They have noticed new ulcerations in the mouth and face.  Denies complaints of abdominal pain, diarrhea, headache, documented fever or other systemic symptoms.   Past Medical History Past Medical History:  Diagnosis Date   Cancer (HCC)    De Quervain's tenosynovitis, left 03/2011   Diabetes mellitus    NIDDM   Family history of breast cancer    Family history of Hodgkin's lymphoma    Hyperlipidemia    Hypertension    under control; has been on med. x 4 yrs.   Nonproliferative retinopathy due to secondary diabetes (HCC)    Patient Active Problem List   Diagnosis Date Noted   COVID 12/25/2020   COVID-19 virus infection 12/25/2020   Genetic testing 03/24/2020   Family history of breast cancer    Family history of Hodgkin's lymphoma    Cholangiocarcinoma (HCC) 02/01/2020   Goals of care, counseling/discussion 01/21/2020   Pancreas cancer (HCC) 01/21/2020   Hypercalcemia 01/14/2020   Left elbow pain 05/19/2019   Numbness 05/19/2019   Nonproliferative retinopathy due to secondary diabetes (HCC)    Allergic rhinitis 06/17/2014   Sinus congestion 05/27/2014   Chronic cough 02/14/2014   Essential hypertension, benign 06/30/2013   Hyperlipidemia 06/30/2013   Diabetes (HCC) 06/30/2013   Home Medication(s) Prior to Admission medications   Medication Sig Start Date End Date Taking? Authorizing Provider  ALPRAZolam (XANAX) 0.5 MG tablet Take 1 tablet  (0.5 mg total) by mouth 3 (three) times daily as needed. 08/20/21   Sherrill, Gary B, MD  blood glucose meter kit and supplies KIT Fasting prior to each meal three times a day 08/25/19   Bates, Crystal A, FNP  Blood Glucose Monitoring Suppl (FREESTYLE LITE) w/Device KIT Use as directed. 11/06/21     Continuous Blood Gluc Sensor (DEXCOM G6 SENSOR) MISC Use as directed to check blood sugar 5-6 times daily. 08/20/21   Pickard, Warren T, MD  Continuous Blood Gluc Transmit (DEXCOM G6 TRANSMITTER) MISC USE AS DIRECTED TO CHECK BLOOD SUGAR 5-6 TIMES DAILY *needs office visit* 11/05/21 11/05/22  Pickard, Warren T, MD  FLUoxetine (PROZAC) 20 MG capsule Take 1 capsule (20 mg total) by mouth daily. Patient not taking: Reported on 11/05/2021 09/18/21   Pickard, Warren T, MD  Glucagon (BAQSIMI TWO PACK) 3 MG/DOSE POWD as directed Nasally Once a day PRN 30 days Patient not taking: Reported on 11/05/2021 05/15/21     glucose blood test strip Dispense based on patient and insurance preference.  Use twice daily as directed. (FOR ICD-10 E11.65) 08/29/17   Pickard, Warren T, MD  Hydrocod Polst-Chlorphen Polst (CHLORPHENIRAMINE-HYDROCODONE) 10-8 MG/5ML Take 5 milliliters by mouth every 12 (twelve) hours as needed for cough. Patient not taking: Reported on 11/05/2021 08/20/21   Sherrill, Gary B, MD  insulin degludec (TRESIBA FLEXTOUCH) 100 UNIT/ML FlexTouch Pen Inject 6 units under the skin once daily. 11/06/21     Insulin Disposable Pump (OMNIPOD 5 G6 POD, GEN 5,) MISC Use as directed every 48 hours 09/07/21       insulin lispro (HUMALOG) 100 UNIT/ML injection Inject 200 units/day under the skin via pump 05/15/21     Insulin Pen Needle (UNIFINE PENTIPS) 32G X 4 MM MISC Use as directed 11/06/21     Insulin Syringe 27G X 1/2" 0.5 ML MISC Use as directed to inject insulin SQ Q1D. 01/31/20   Pickard, Warren T, MD  Lancets Thin MISC Check BS BID 08/29/17   Pickard, Warren T, MD  lidocaine-prilocaine (EMLA) cream APPLY 1 APPLICATION TOPICALLY  AS DIRECTED. APPLY TO PORT SITE 1-2 HOURS PRIOR TO STICK AND COVER WITH PLASTIC WRAP. 07/03/21 07/03/22  Sherrill, Gary B, MD  LORazepam (ATIVAN) 0.5 MG tablet Take 1 tablet (0.5 mg total) by mouth at bedtime as needed for anxiety. 11/05/21   Sherrill, Gary B, MD  oxyCODONE (OXY IR/ROXICODONE) 5 MG immediate release tablet TAKE 1 TABLET BY MOUTH EVERY 6 HOURS AS NEEDED FOR SEVERE PAIN. 10/22/21 04/20/22  Thomas, Lisa K, NP  prochlorperazine (COMPAZINE) 10 MG tablet TAKE 1 TABLET BY MOUTH EVERY 6 HOURS AS NEEDED FOR NAUSEA 10/09/21 10/09/22  Sherrill, Gary B, MD  Senna (SENOKOT LAXATIVE GUMMIES PO) Take 2 tablets by mouth daily at 12 noon.    [provider]  spironolactone (ALDACTONE) 25 MG tablet Take 1 tablet (25 mg total) by mouth daily. 10/10/21   Thomas, Lisa K, NP  traMADol (ULTRAM) 50 MG tablet TAKE 1 TABLET BY MOUTH EVERY 6 HOURS AS NEEDED FOR MODERATE PAIN (COUGH). Patient not taking: Reported on 11/05/2021 08/20/21 02/16/22  Sherrill, Gary B, MD  TRUEPLUS LANCETS 33G MISC 1 each by Does not apply route 2 (two) times daily. 07/06/13   Pickard, Warren T, MD                                                                                                                                    Past Surgical History Past Surgical History:  Procedure Laterality Date   CARPAL TUNNEL RELEASE     bilat.   DORSAL COMPARTMENT RELEASE  03/14/2011   Procedure: RELEASE DORSAL COMPARTMENT (DEQUERVAIN);  Surgeon: William M Gramig III, MD;  Location: [facility];  Service: Orthopedics;  Laterality: Left;  left wrist APL and EPL tenosynovectomy and 1st dorsal compartment release   IR IMAGE GUIDED DRAINAGE PERCUT CATH  PERITONEAL RETROPERIT  10/31/2021   IR IMAGING GUIDED PORT INSERTION  02/01/2020   IR PARACENTESIS  09/10/2021   IR PARACENTESIS  09/21/2021   IR PARACENTESIS  09/27/2021   IR PARACENTESIS  10/03/2021   IR PARACENTESIS  10/12/2021   IR PARACENTESIS  10/19/2021   IR PARACENTESIS  10/25/2021   IR  PARACENTESIS  11/06/2021   TONSILLECTOMY AND ADENOIDECTOMY  as a child   VASECTOMY     Family History Family History  Problem Relation Age of Onset   Cancer Other    Hyperlipidemia Other    Hypertension Brother    Asthma Son      Lung cancer Maternal Grandfather    Mesothelioma Maternal Grandfather        asbestos exposure   Rheum arthritis Maternal Grandmother    Breast cancer Maternal Grandmother 5   Hodgkin's lymphoma Mother 75       30 lb tumor removed from abdomen   Cirrhosis Paternal Grandmother    Other Maternal Great-grandfather        black lung, worked in [place] (MGF's father)    Social History Social History   Tobacco Use   Smoking status: Former    Packs/day: 3.00    Years: 6.00    Total pack years: 18.00    Types: Cigarettes    Quit date: 02/05/1999    Years since quitting: 22.7   Smokeless tobacco: Never  Vaping Use   Vaping Use: Never used  Substance Use Topics   Alcohol use: No    Alcohol/week: 0.0 standard drinks of alcohol   Drug use: No   Allergies Niaspan [niacin]  Review of Systems Review of Systems  HENT:  Positive for sore throat.   Respiratory:  Positive for cough.   Cardiovascular:  Positive for chest pain.    Physical Exam Vital Signs  I have reviewed the triage vital signs BP 120/76 (BP Location: Right Arm)   Pulse 88   Temp 97.9 F (36.6 C) (Oral)   Resp 15   Ht 6' 2" (1.88 m)   Wt 83.9 kg   SpO2 98%   BMI 23.75 kg/m   Physical Exam Constitutional:      General: He is not in acute distress.    Appearance: Normal appearance. He is ill-appearing.  HENT:     Head: Normocephalic and atraumatic.     Nose: No congestion or rhinorrhea.     Mouth/Throat:     Comments: White ulcerations in the posterior oropharynx and mouth, tongue Eyes:     General: Scleral icterus present.        Right eye: No discharge.        Left eye: No discharge.     Extraocular Movements: Extraocular movements intact.     Pupils: Pupils are  equal, round, and reactive to light.  Cardiovascular:     Rate and Rhythm: Normal rate and regular rhythm.     Heart sounds: No murmur heard. Pulmonary:     Effort: No respiratory distress.     Breath sounds: Rales present. No wheezing.  Abdominal:     General: There is no distension.     Tenderness: There is no abdominal tenderness.  Musculoskeletal:        General: Normal range of motion.     Cervical back: Normal range of motion.  Skin:    General: Skin is warm and dry.  Neurological:     General: No focal deficit present.     Mental Status: He is alert.     ED Results and Treatments Labs (all labs ordered are listed, but only abnormal results are displayed) Labs Reviewed  RESP PANEL BY RT-PCR (RSV, FLU A&B, COVID)  RVPGX2  GROUP A STREP BY PCR  COMPREHENSIVE METABOLIC PANEL  CBC WITH DIFFERENTIAL/PLATELET  Radiology DG Chest 2 View  Result Date: 11/15/2021 CLINICAL DATA:  46 year old male with cough and sore throat. Productive cough. Cholangiocarcinoma with malignant ascites. EXAM: CHEST - 2 VIEW COMPARISON:  CT Chest, Abdomen, and Pelvis 07/13/2021 and earlier. FINDINGS: AP semi upright view at upright AP and lateral views of the chest at 0905 hours. Lower lung volumes. Stable right chest power port. Mediastinal contours are stable and within normal limits. A battery device external to the chest is visible only on the lateral view. Visualized tracheal air column is within normal limits. No pneumothorax, pulmonary edema, pleural effusion or confluent pulmonary opacity. Nonobstructed visible bowel gas pattern. No acute osseous abnormality identified. IMPRESSION: Low lung volumes.  No acute cardiopulmonary abnormality. Electronically Signed   By: Genevie Ann M.D.   On: 11/15/2021 09:21    Pertinent labs & imaging results that were available during my care of the  patient were reviewed by me and considered in my medical decision making (see MDM for details).  Medications Ordered in ED Medications  ondansetron (ZOFRAN) injection 4 mg (has no administration in time range)                                                                                                                                     Procedures Procedures  (including critical care time)  Medical Decision Making / ED Course   This patient presents to the ED for concern of cough, sore throat, this involves an extensive number of treatment options, and is a complaint that carries with it a high risk of complications and morbidity.  The differential diagnosis includes mucositis, candidal esophagitis, strep throat, viral URI, chemotherapy intolerance, progression of underlying malignancy  MDM: Patient seen in the emergency room for evaluation of multiple complaints described above.  Physical exam reveals an ill-appearing jaundiced patient with white ulcerative plaques in the posterior oropharynx and on the tongue concerning for a candidal infection.  Laboratory evaluation with a leukopenia to 2.2, thrombocytopenia to 109, hyponatremia 119, potassium 5.6, hypochloremia to 86, BUN 51, albumin 2.5, AST 91, ALT 56, alk phos 438 with a doubling of his total bilirubin to 8.8.  Strep negative, COVID and flu negative.  Chest x-ray unremarkable.  The patient currently does not follow with gastroenterology and thus I consulted gastroenterology on-call with [facility] physicians who recommended nystatin swish and swallow and she will follow the patient while inpatient.  Unclear about the patient's underlying liver function and whether fluconazole would be safe for this patient.  Patient required multiple doses of viscous lidocaine for symptom control and use will need to be monitored closely to ensure the patient does not develop LAST.  Patient then admitted to the hospital service as he is currently unable to  tolerate p.o.  Symptomatically improved with Zofran at this time.   Additional history obtained: -Additional history obtained from wife -External records from outside source obtained and reviewed including: Chart  review including previous notes, labs, imaging, consultation notes   Lab Tests: -I ordered, reviewed, and interpreted labs.   The pertinent results include:   Labs Reviewed  RESP PANEL BY RT-PCR (RSV, FLU A&B, COVID)  RVPGX2  GROUP A STREP BY PCR  COMPREHENSIVE METABOLIC PANEL  CBC WITH DIFFERENTIAL/PLATELET       Imaging Studies ordered: I ordered imaging studies including chest x-ray I independently visualized and interpreted imaging. I agree with the radiologist interpretation   Medicines ordered and prescription drug management: Meds ordered this encounter  Medications   ondansetron (ZOFRAN) injection 4 mg    -I have reviewed the patients home medicines and have made adjustments as needed  Critical interventions none  Consultations Obtained: I requested consultation with the gastroenterologist on-call,  and discussed lab and imaging findings as well as pertinent plan - they recommend: Nystatin swish and swallow   Cardiac Monitoring: The patient was maintained on a cardiac monitor.  I personally viewed and interpreted the cardiac monitored which showed an underlying rhythm of: NSR  Social Determinants of Health:  Factors impacting patients care include: none   Reevaluation: After the interventions noted above, I reevaluated the patient and found that they have :improved  Co morbidities that complicate the patient evaluation  Past Medical History:  Diagnosis Date   Cancer ([place])    De Quervain's tenosynovitis, left 03/2011   Diabetes mellitus    NIDDM   Family history of breast cancer    Family history of Hodgkin's lymphoma    Hyperlipidemia    Hypertension    under control; has been on med. x 4 yrs.   Nonproliferative retinopathy due to  secondary diabetes [facility])       Dispostion: I considered admission for this patient, and due to inability tolerate p.o. secondary to candidal esophagitis patient will require hospital admission     Final Clinical Impression(s) / ED Diagnoses Final diagnoses:  None     _0 @    Teressa Lower, MD 11/15/21 1705

## 2021-11-15 NOTE — ED Notes (Signed)
Called lab about resp panal, covid, flu. Approx 20 minutes left. Also working on diff for CBC.

## 2021-11-15 NOTE — ED Notes (Addendum)
Called report to [facility].

## 2021-11-15 NOTE — ED Notes (Signed)
Reported given to [facility].

## 2021-11-16 DIAGNOSIS — C787 Secondary malignant neoplasm of liver and intrahepatic bile duct: Secondary | ICD-10-CM

## 2021-11-16 DIAGNOSIS — E43 Unspecified severe protein-calorie malnutrition: Secondary | ICD-10-CM | POA: Insufficient documentation

## 2021-11-16 DIAGNOSIS — B370 Candidal stomatitis: Secondary | ICD-10-CM

## 2021-11-16 DIAGNOSIS — R1310 Dysphagia, unspecified: Secondary | ICD-10-CM

## 2021-11-16 DIAGNOSIS — E1165 Type 2 diabetes mellitus with hyperglycemia: Secondary | ICD-10-CM | POA: Diagnosis not present

## 2021-11-16 DIAGNOSIS — R180 Malignant ascites: Secondary | ICD-10-CM

## 2021-11-16 DIAGNOSIS — B3781 Candidal esophagitis: Secondary | ICD-10-CM | POA: Diagnosis not present

## 2021-11-16 DIAGNOSIS — N179 Acute kidney failure, unspecified: Secondary | ICD-10-CM

## 2021-11-16 DIAGNOSIS — C221 Intrahepatic bile duct carcinoma: Secondary | ICD-10-CM | POA: Diagnosis not present

## 2021-11-16 DIAGNOSIS — E875 Hyperkalemia: Secondary | ICD-10-CM

## 2021-11-16 LAB — DIFFERENTIAL
Abs Immature Granulocytes: 0.42 10*3/uL — ABNORMAL HIGH (ref 0.00–0.07)
Basophils Absolute: 0 10*3/uL (ref 0.0–0.1)
Basophils Relative: 0 %
Eosinophils Absolute: 0 10*3/uL (ref 0.0–0.5)
Eosinophils Relative: 1 %
Immature Granulocytes: 9 %
Lymphocytes Relative: 22 %
Lymphs Abs: 1 10*3/uL (ref 0.7–4.0)
Monocytes Absolute: 1.4 10*3/uL — ABNORMAL HIGH (ref 0.1–1.0)
Monocytes Relative: 29 %
Neutro Abs: 1.9 10*3/uL (ref 1.7–7.7)
Neutrophils Relative %: 39 %
nRBC: 0 /100 WBC

## 2021-11-16 LAB — GLUCOSE, CAPILLARY
Glucose-Capillary: 114 mg/dL — ABNORMAL HIGH (ref 70–99)
Glucose-Capillary: 154 mg/dL — ABNORMAL HIGH (ref 70–99)
Glucose-Capillary: 116 mg/dL — ABNORMAL HIGH (ref 70–99)
Glucose-Capillary: 139 mg/dL — ABNORMAL HIGH (ref 70–99)
Glucose-Capillary: 197 mg/dL — ABNORMAL HIGH (ref 70–99)
Glucose-Capillary: 167 mg/dL — ABNORMAL HIGH (ref 70–99)

## 2021-11-16 LAB — CBC
HCT: 35.6 % — ABNORMAL LOW (ref 39.0–52.0)
Hemoglobin: 12.6 g/dL — ABNORMAL LOW (ref 13.0–17.0)
MCH: 33.5 pg (ref 26.0–34.0)
MCHC: 35.4 g/dL (ref 30.0–36.0)
MCV: 94.7 fL (ref 80.0–100.0)
Platelets: 142 10*3/uL — ABNORMAL LOW (ref 150–400)
RBC: 3.76 MIL/uL — ABNORMAL LOW (ref 4.22–5.81)
RDW: 15.9 % — ABNORMAL HIGH (ref 11.5–15.5)
WBC: 4.8 10*3/uL (ref 4.0–10.5)
nRBC: 0 % (ref 0.0–0.2)

## 2021-11-16 LAB — RENAL FUNCTION PANEL
Albumin: 1.8 g/dL — ABNORMAL LOW (ref 3.5–5.0)
Albumin: 1.9 g/dL — ABNORMAL LOW (ref 3.5–5.0)
Anion gap: 8 (ref 5–15)
Anion gap: 8 (ref 5–15)
BUN: 64 mg/dL — ABNORMAL HIGH (ref 6–20)
BUN: 69 mg/dL — ABNORMAL HIGH (ref 6–20)
CO2: 22 mmol/L (ref 22–32)
CO2: 23 mmol/L (ref 22–32)
Calcium: 9.4 mg/dL (ref 8.9–10.3)
Calcium: 9.6 mg/dL (ref 8.9–10.3)
Chloride: 91 mmol/L — ABNORMAL LOW (ref 98–111)
Chloride: 92 mmol/L — ABNORMAL LOW (ref 98–111)
Creatinine, Ser: 1.68 mg/dL — ABNORMAL HIGH (ref 0.61–1.24)
Creatinine, Ser: 1.77 mg/dL — ABNORMAL HIGH (ref 0.61–1.24)
GFR, Estimated: 47 mL/min — ABNORMAL LOW (ref >60)
GFR, Estimated: 50 mL/min — ABNORMAL LOW (ref >60)
Glucose, Bld: 136 mg/dL — ABNORMAL HIGH (ref 70–99)
Glucose, Bld: 145 mg/dL — ABNORMAL HIGH (ref 70–99)
Phosphorus: 5.1 mg/dL — ABNORMAL HIGH (ref 2.5–4.6)
Phosphorus: 5.2 mg/dL — ABNORMAL HIGH (ref 2.5–4.6)
Potassium: 5.6 mmol/L — ABNORMAL HIGH (ref 3.5–5.1)
Potassium: 5.9 mmol/L — ABNORMAL HIGH (ref 3.5–5.1)
Sodium: 122 mmol/L — ABNORMAL LOW (ref 135–145)
Sodium: 122 mmol/L — ABNORMAL LOW (ref 135–145)

## 2021-11-16 LAB — BASIC METABOLIC PANEL
Anion gap: 9 (ref 5–15)
BUN: 72 mg/dL — ABNORMAL HIGH (ref 6–20)
CO2: 22 mmol/L (ref 22–32)
Calcium: 9.2 mg/dL (ref 8.9–10.3)
Chloride: 91 mmol/L — ABNORMAL LOW (ref 98–111)
Creatinine, Ser: 1.89 mg/dL — ABNORMAL HIGH (ref 0.61–1.24)
GFR, Estimated: 44 mL/min — ABNORMAL LOW (ref >60)
Glucose, Bld: 192 mg/dL — ABNORMAL HIGH (ref 70–99)
Potassium: 5 mmol/L (ref 3.5–5.1)
Sodium: 122 mmol/L — ABNORMAL LOW (ref 135–145)

## 2021-11-16 LAB — CREATININE, URINE, RANDOM: Creatinine, Urine: 107 mg/dL

## 2021-11-16 LAB — HIV ANTIBODY (ROUTINE TESTING W REFLEX): HIV Screen 4th Generation wRfx: NONREACTIVE

## 2021-11-16 LAB — OSMOLALITY, URINE: Osmolality, Ur: 732 mOsm/kg (ref 300–900)

## 2021-11-16 MED ORDER — SUCRALFATE 1 GM/10ML PO SUSP
1.0000 g | Freq: Three times a day (TID) | ORAL | Status: DC
  Administered 2021-11-16 – 2021-11-19 (×12): 1 g via ORAL
  Filled 2021-11-16 (×14): qty 10

## 2021-11-16 MED ORDER — PROSOURCE PLUS PO LIQD
30.0000 mL | Freq: Two times a day (BID) | ORAL | Status: DC
  Administered 2021-11-17 – 2021-11-19 (×4): 30 mL via ORAL
  Filled 2021-11-16 (×4): qty 30

## 2021-11-16 MED ORDER — INSULIN ASPART 100 UNIT/ML IJ SOLN: 10.0000 [IU] | Freq: Once | INTRAMUSCULAR | Status: DC

## 2021-11-16 MED ORDER — MAGIC MOUTHWASH W/LIDOCAINE
5.0000 mL | Freq: Four times a day (QID) | ORAL | Status: DC
  Administered 2021-11-16: 5 mL via ORAL
  Filled 2021-11-16 (×2): qty 5

## 2021-11-16 MED ORDER — MAGIC MOUTHWASH W/LIDOCAINE
5.0000 mL | Freq: Four times a day (QID) | ORAL | Status: DC
  Filled 2021-11-16: qty 5

## 2021-11-16 MED ORDER — ALUM & MAG HYDROXIDE-SIMETH 200-200-20 MG/5ML PO SUSP
30.0000 mL | Freq: Four times a day (QID) | ORAL | Status: DC | PRN
  Administered 2021-11-16: 30 mL via ORAL
  Filled 2021-11-16: qty 30

## 2021-11-16 MED ORDER — BOOST / RESOURCE BREEZE PO LIQD CUSTOM
1.0000 | Freq: Three times a day (TID) | ORAL | Status: DC
  Administered 2021-11-16 – 2021-11-19 (×8): 1 via ORAL

## 2021-11-16 MED ORDER — PANTOPRAZOLE SODIUM 40 MG IV SOLR
40.0000 mg | Freq: Every day | INTRAVENOUS | Status: DC
  Administered 2021-11-16 – 2021-11-17 (×2): 40 mg via INTRAVENOUS
  Filled 2021-11-16 (×2): qty 10

## 2021-11-16 MED ORDER — MAGIC MOUTHWASH: 10.0000 mL | Freq: Four times a day (QID) | ORAL | Status: DC | PRN

## 2021-11-16 MED ORDER — PHENOL 1.4 % MT LIQD
1.0000 | OROMUCOSAL | Status: DC | PRN
  Filled 2021-11-16: qty 177

## 2021-11-16 MED ORDER — ALBUMIN HUMAN 25 % IV SOLN
25.0000 g | Freq: Four times a day (QID) | INTRAVENOUS | Status: AC
  Administered 2021-11-16 – 2021-11-18 (×8): 25 g via INTRAVENOUS
  Filled 2021-11-16 (×9): qty 100

## 2021-11-16 MED ORDER — SODIUM ZIRCONIUM CYCLOSILICATE 10 G PO PACK
10.0000 g | PACK | Freq: Once | ORAL | Status: AC
  Administered 2021-11-16: 10 g via ORAL
  Filled 2021-11-16: qty 1

## 2021-11-16 MED ORDER — DEXTROSE 50 % IV SOLN: 25.0000 g | Freq: Once | INTRAVENOUS | Status: DC

## 2021-11-16 MED ORDER — DICYCLOMINE HCL 10 MG/5ML PO SOLN
10.0000 mg | Freq: Three times a day (TID) | ORAL | Status: DC | PRN
  Administered 2021-11-17 – 2021-11-19 (×5): 10 mg via ORAL
  Filled 2021-11-16 (×8): qty 5

## 2021-11-16 MED ORDER — MAGIC MOUTHWASH W/LIDOCAINE
10.0000 mL | Freq: Four times a day (QID) | ORAL | Status: DC | PRN
  Administered 2021-11-16 – 2021-11-18 (×6): 10 mL via ORAL
  Filled 2021-11-16 (×10): qty 10

## 2021-11-16 NOTE — Progress Notes (Signed)
K is 6.2 this am. Orders placed for treatment-wife just wants to wait until she speaks with MD in person before starting treatment.  Patient on telemetry & wife (also a Therapist, sports) remains at bedside.

## 2021-11-16 NOTE — Progress Notes (Addendum)
Patient ID: Matthew Osborne, male   DOB: [birth year], 46 y.o.   MRN: [id number] Pt's left abd tunneled cath examined; site appears ok with minimal erythema; wife reports some leakage from insertion site; pt had 4 liter paracentesis with pursestring suture placement on 10/3 by Dr. Dwaine Gale. Latest [place] abd shows some residual ascites. To minimize leakage would recommend drainage of pleurx today (1-2 liter increment if necessary); closely monitor labs as creat 1.77 (gently hydrate), Na 122  and K 5.6- needs correction. Above d/w pt's wife/nurse.

## 2021-11-16 NOTE — Consult Note (Signed)
[facility] for Infectious Disease       Reason for Consult: esophageal thrush    Referring Physician: Dr. Marylyn Osborne  Principal Problem:   Thrush Active Problems:   DMII (diabetes mellitus, type 2) (HCC)   Hypercalcemia   Cholangiocarcinoma metastatic to liver (HCC)   Hyponatremia   AKI (acute kidney injury) ([place])   Malignant ascites   Neutropenia (HCC)   Anxiety   Hyperkalemia   Odynophagia    insulin aspart  0-9 Units Subcutaneous TID WC   pantoprazole (PROTONIX) IV  40 mg Intravenous Daily   sucralfate  1 g Oral TID WC & HS    Recommendations: Micafungin Monitor LFTs  Assessment: Likely esophageal candidiasis on micafungin and also with elevated LFTs which have been stable.  LFTs mainly appear to be a cholestatic pattern less than hepatotoxicity.  Can continue with IV micafungin with his difficulty  to swallow but it is ok from my standpoint that he takes oral fluconazole at discharge with periodic monitoring of his LFTs when he is ready for d/c.    Dr. [place] B[place] on over the weekend if needed.   Antibiotics: micafungin  HPI: Matthew Osborne is a 46 y.o. male with a history of cholangiocarcinoma, hypercalcemia, DM came in with sore throat and found to have thrush.  + dysphagia, odonophagia.  Decreased PO intake.  No fever.  Currently on chemotherapy.  Concern for esophageal involvement.  LFTs elevated.    Review of Systems:  Constitutional: negative for fevers and chills All other systems reviewed and are negative    Past Medical History:  Diagnosis Date   Cancer ([place])    De Quervain's tenosynovitis, left 03/2011   Diabetes mellitus    NIDDM   Family history of breast cancer    Family history of Hodgkin's lymphoma    Hyperlipidemia    Hypertension    under control; has been on med. x 4 yrs.   Nonproliferative retinopathy due to secondary diabetes ([place])     Social History   Tobacco Use   Smoking status: Former    Packs/day: 3.00    Years: 6.00     Total pack years: 18.00    Types: Cigarettes    Quit date: 02/05/1999    Years since quitting: 22.7   Smokeless tobacco: Never  Vaping Use   Vaping Use: Never used  Substance Use Topics   Alcohol use: No    Alcohol/week: 0.0 standard drinks of alcohol   Drug use: No    Family History  Problem Relation Age of Onset   Cancer Other    Hyperlipidemia Other    Hypertension Brother    Asthma Son    Lung cancer Maternal Grandfather    Mesothelioma Maternal Grandfather        asbestos exposure   Rheum arthritis Maternal Grandmother    Breast cancer Maternal Grandmother 69   Hodgkin's lymphoma Mother 8       30 lb tumor removed from abdomen   Cirrhosis Paternal Grandmother    Other Maternal Great-grandfather        black lung, worked in [place] (MGF's father)    Allergies  Allergen Reactions   Niaspan [Niacin] Other (See Comments)    Flushing     Physical Exam: Constitutional: in no apparent distress  Vitals:   11/16/21 0902 11/16/21 1128  BP: 119/68 109/71  Pulse: 77 88  Resp: 18 18  Temp: (!) 97.4 F (36.3 C) (!) 97.4 F (36.3 C)  SpO2: 97% 100%   EYES: anicteric ENMT: + thrush  Lab Results  Component Value Date   WBC 4.8 11/16/2021   HGB 12.6 (L) 11/16/2021   HCT 35.6 (L) 11/16/2021   MCV 94.7 11/16/2021   PLT 142 (L) 11/16/2021    Lab Results  Component Value Date   CREATININE 1.77 (H) 11/16/2021   BUN 69 (H) 11/16/2021   NA 122 (L) 11/16/2021   K 5.6 (H) 11/16/2021   CL 91 (L) 11/16/2021   CO2 23 11/16/2021    Lab Results  Component Value Date   ALT 56 (H) 11/15/2021   AST 91 (H) 11/15/2021   ALKPHOS 438 (H) 11/15/2021     Microbiology: Recent Results (from the past 240 hour(s))  Group A Strep by PCR     Status: None   Collection Time: 11/15/21  9:24 AM   Specimen: Throat; Sterile Swab  Result Value Ref Range Status   Group A Strep by PCR NOT DETECTED NOT DETECTED Final    Comment: Performed at [facility], [address] [place], [place] [address]  Resp panel by RT-PCR (RSV, Flu A&B, Covid)     Status: None   Collection Time: 11/15/21 10:00 AM  Result Value Ref Range Status   SARS Coronavirus 2 by RT PCR NEGATIVE NEGATIVE Final    Comment: (NOTE) SARS-CoV-2 target nucleic acids are NOT DETECTED.  The SARS-CoV-2 RNA is generally detectable in upper respiratory specimens during the acute phase of infection. The lowest concentration of SARS-CoV-2 viral copies this assay can detect is 138 copies/mL. A negative result does not preclude SARS-Cov-2 infection and should not be used as the sole basis for treatment or other patient management decisions. A negative result may occur with  improper specimen collection/handling, submission of specimen other than nasopharyngeal swab, presence of viral mutation(s) within the areas targeted by this assay, and inadequate number of viral copies(<138 copies/mL). A negative result must be combined with clinical observations, patient history, and epidemiological information. The expected result is Negative.  Fact Sheet for Patients:  EntrepreneurPulse.com.au  Fact Sheet for Healthcare Providers:  IncredibleEmployment.be  This test is no t yet approved or cleared by the [place] FDA and  has been authorized for detection and/or diagnosis of SARS-CoV-2 by FDA under an Emergency Use Authorization (EUA). This EUA will remain  in effect (meaning this test can be used) for the duration of the COVID-19 declaration under Section 564(b)(1) of the Act, 21 U.S.C.section 360bbb-3(b)(1), unless the authorization is terminated  or revoked sooner.       Influenza A by PCR NEGATIVE NEGATIVE Final   Influenza B by PCR NEGATIVE NEGATIVE Final    Comment: (NOTE) The Xpert Xpress SARS-CoV-2/FLU/RSV plus assay is intended as an aid in the diagnosis of influenza from Nasopharyngeal swab specimens and should not be used as a sole  basis for treatment. Nasal washings and aspirates are unacceptable for Xpert Xpress SARS-CoV-2/FLU/RSV testing.  Fact Sheet for Patients: EntrepreneurPulse.com.au  Fact Sheet for Healthcare Providers: IncredibleEmployment.be  This test is not yet approved or cleared by the [place] FDA and has been authorized for detection and/or diagnosis of SARS-CoV-2 by FDA under an Emergency Use Authorization (EUA). This EUA will remain in effect (meaning this test can be used) for the duration of the COVID-19 declaration under Section 564(b)(1) of the Act, 21 U.S.C. section 360bbb-3(b)(1), unless the authorization is terminated or revoked.     Resp Syncytial Virus by PCR NEGATIVE NEGATIVE Final  Comment: (NOTE) Fact Sheet for Patients: EntrepreneurPulse.com.au  Fact Sheet for Healthcare Providers: IncredibleEmployment.be  This test is not yet approved or cleared by the [place] FDA and has been authorized for detection and/or diagnosis of SARS-CoV-2 by FDA under an Emergency Use Authorization (EUA). This EUA will remain in effect (meaning this test can be used) for the duration of the COVID-19 declaration under Section 564(b)(1) of the Act, 21 U.S.C. section 360bbb-3(b)(1), unless the authorization is terminated or revoked.  Performed at [facility], [address], [place], [place] [address]     Matthew Osborne, [facility] for Infectious Disease [facility] [email or web address] 11/16/2021, 1:48 PM

## 2021-11-16 NOTE — Hospital Course (Signed)
Mr. Elza is a 46 yo male with PMH cholangiocarcinoma (mets to liver), recurrent abdominal ascites (s/p peritoneal PleurX on 10/31/21), hyperCa, anxiety, DMII, HLD, HTN who presented with multiple complaints including thick frothy productive cough and ongoing increasing difficulty with swallowing.  He was describing painful swallowing as well as newer onset indigestion.  He has been taking Magic mouthwash with lidocaine which is the only thing he says that helps typically for his recent complaints of odynophagia. He was requiring consistent paracentesis prior to placement of Pleurx catheter.  He is not on hospice level care and Pleurx catheter placement was ultimately pursued due to ongoing paracentesis requirement. She has been draining approximately 1 L daily since placement but some days less than 1 L.  On work-up in the ER, patient was noted to have significant thrush with further concern for possible esophageal candidiasis.  Case was briefly discussed with ID and due to hepatic metastasis/abnormal LFTs, patient was placed on micafungin and admitted for further work-up and GI evaluation. He was considered appropriate for de-escalating to fluconazole to continue course at discharge with monitoring of LFTs.  With further work-up, he also had multiple metabolic derangements.  Many of these derangements improved with IV fluids.  See below for further A&P.

## 2021-11-16 NOTE — Assessment & Plan Note (Addendum)
-   multifactorial etiology (malignancy, hypovolemia from poor intake and PleurX losses) - baseline around ~128-130 - Na 119 on admission -Responded well to fluid resuscitation. Na 128 at discharge - okay for prozac at discharge; SIADH still a consideration

## 2021-11-16 NOTE — Assessment & Plan Note (Signed)
-   continue ativan

## 2021-11-16 NOTE — Assessment & Plan Note (Addendum)
-   likely multifactorial in setting of malignancy but also hypovolemia - improved with IVF - resolved

## 2021-11-16 NOTE — TOC Initial Note (Signed)
Transition of Care [facility]) - Initial/Assessment Note    Patient Details  Name: Matthew Osborne MRN: [id number] Date of Birth: Feb 04, 1976  Transition of Care [facility]) CM/SW Contact:    Dessa Phi, RN Phone Number: 11/16/2021, 11:06 AM  Clinical Narrative:Noted pleurx drain-monitor if needed for home.                   Expected Discharge Plan: Home/Self Care Barriers to Discharge: Continued Medical Work up   Patient Goals and CMS Choice        Expected Discharge Plan and Services Expected Discharge Plan: Home/Self Care                                              Prior Living Arrangements/Services                       Activities of Daily Living Home Assistive Devices/Equipment: None ADL Screening (condition at time of admission) Patient's cognitive ability adequate to safely complete daily activities?: Yes Is the patient deaf or have difficulty hearing?: No Does the patient have difficulty seeing, even when wearing glasses/contacts?: No Does the patient have difficulty concentrating, remembering, or making decisions?: Yes Patient able to express need for assistance with ADLs?: Yes Does the patient have difficulty dressing or bathing?: Yes Independently performs ADLs?: Yes (appropriate for developmental age) Does the patient have difficulty walking or climbing stairs?: Yes Weakness of Legs: Both Weakness of Arms/Hands: Both  Permission Sought/Granted                  Emotional Assessment              Admission diagnosis:  Esophageal candidiasis ([place]) [B37.81] Candidiasis [B37.9] Cholangiocarcinoma ([place]) [C22.1] Patient Active Problem List   Diagnosis Date Noted   Esophageal candidiasis ([place]) 11/15/2021   Hyponatremia 11/15/2021   AKI (acute kidney injury) ([place]) 11/15/2021   Ascites 11/15/2021   Neutropenia ([place]) 11/15/2021   Anxiety 11/15/2021   Hyperkalemia 11/15/2021   COVID 12/25/2020   COVID-19 virus infection 12/25/2020    Genetic testing 03/24/2020   Family history of breast cancer    Family history of Hodgkin's lymphoma    Cholangiocarcinoma ([place]) 02/01/2020   Goals of care, counseling/discussion 01/21/2020   Pancreas cancer ([place]) 01/21/2020   Hypercalcemia 01/14/2020   Left elbow pain 05/19/2019   Numbness 05/19/2019   Nonproliferative retinopathy due to secondary diabetes ([place])    Allergic rhinitis 06/17/2014   Sinus congestion 05/27/2014   Chronic cough 02/14/2014   Essential hypertension, benign 06/30/2013   Hyperlipidemia 06/30/2013   Diabetes ([place]) 06/30/2013   PCP:  Susy Frizzle, MD Pharmacy:   [facility] [address] Phone: [phone] Fax: [phone]     Social Determinants of Health (SDOH) Interventions    Readmission Risk Interventions     No data to display

## 2021-11-16 NOTE — Assessment & Plan Note (Signed)
-   See thrush/candidiasis work-up as well -Patient states MMW with lidocaine has helped, continue this -Add on Maalox and Bentyl as patient also describing "burning pain" -Protonix IV daily; continue Carafate -If patient does return with recurrent odynophagia, will likely need EGD for more direct evaluation

## 2021-11-16 NOTE — Assessment & Plan Note (Signed)
-   baseline creatinine ~ 0.8 - 1 - patient presents with increase in creat >0.3 mg/dL above baseline, creat increase >1.5x baseline presumed to have occurred within past 7 days PTA - etiology suspected due to pre-renal from poor PO intake; also contribution from hypoperfusion with hypoalbuminemia in setting of ongoing PleurX cath use/drainage which will increase risk of ATN (I have emphasized to wife/patient that PleurX is beneficial moreso in a setting of hospice and may be making things worse now currently) - continue IVF; add on albumin to help with 3rd spacing - check FeNa: <1%, consistent with prerenal  - renal u/s negative for obstruction or hydro

## 2021-11-16 NOTE — Consult Note (Signed)
[facility] Gastroenterology Consult  Referring Provider: No ref. provider found Primary Care Physician:  Susy Frizzle, MD Primary Gastroenterologist: Althia Forts  Reason for Consultation: Odynophagia  SUBJECTIVE:   HPI: Matthew Osborne is a 46 y.o. 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  He is accompanied today by his wife who provides supplemental information.  He has refractory ascites and is status post Pleurx drain in abdomen.  Wife notes that she removes roughly 1 L from Pleurx drain per day.  No history of peritonitis or peritoneal infection.   She notes that roughly 1 week after chemotherapy on 11/07/2021 he began to have sore throat.  Patient notes that he has odynophagia as well as dysphagia.  He has no specific abdominal discomfort.  He has been having constipation since starting chemotherapy.  There is no blood per rectum.  He has not had nausea or vomiting.  He has no shortness of breath.  He has chest discomfort with odynophagia.  No prior EGD or colonoscopy.  Labs on presentation showed NA 119, potassium 5.6, BUN/creatinine 51/1.20, ALP 438, AST/ALT 91/56, total bilirubin 8.8, WBC 2.2, hemoglobin 13.8, platelet 109.  CT abdomen pelvis completed on 07/13/2021 showed left liver lobe mass, other lesions throughout liver appears increased necrotic and increased in size, small perihepatic ascites, no biliary dilatation.  Chest x-ray completed on 11/15/2021 showed low lung volumes.  No anticoagulation prior to admission.   Emergency department contacted myself yesterday with concerning for Candida esophagitis given plaques in mouth, consideration was given for fluconazole though given liver enzyme abnormalities this was not used.  I discussed using nystatin swish and swallow in this patient.  He has since been started on  micafungin 150 mg IV every 24 hours.  Magic mouthwash has also been ordered for him as well as Carafate suspension 1 g p.o. 3 times daily and nightly.  Past Medical History:  Diagnosis Date   Cancer ([place])    De Quervain's tenosynovitis, left 03/2011   Diabetes mellitus    NIDDM   Family history of breast cancer    Family history of Hodgkin's lymphoma    Hyperlipidemia    Hypertension    under control; has been on med. x 4 yrs.   Nonproliferative retinopathy due to secondary diabetes [facility])    Past Surgical History:  Procedure Laterality Date   CARPAL TUNNEL RELEASE     bilat.   DORSAL COMPARTMENT RELEASE  03/14/2011   Procedure: RELEASE DORSAL COMPARTMENT (DEQUERVAIN);  Surgeon: Paulene Floor, MD;  Location: [place];  Service: Orthopedics;  Laterality: Left;  left wrist APL and EPL tenosynovectomy and 1st dorsal compartment release   IR IMAGE GUIDED DRAINAGE PERCUT CATH  PERITONEAL RETROPERIT  10/31/2021   IR IMAGING GUIDED PORT INSERTION  02/01/2020   IR PARACENTESIS  09/10/2021   IR PARACENTESIS  09/21/2021   IR PARACENTESIS  09/27/2021   IR PARACENTESIS  10/03/2021   IR PARACENTESIS  10/12/2021   IR PARACENTESIS  10/19/2021   IR PARACENTESIS  10/25/2021   IR PARACENTESIS  11/06/2021   TONSILLECTOMY AND ADENOIDECTOMY  as a child   VASECTOMY     Prior to Admission medications   Medication Sig Start Date End Date Taking? Authorizing Provider  ALPRAZolam Duanne Moron) 0.5 MG tablet Take 1 tablet (0.5 mg total) by mouth 3 (three) times daily as needed. 08/20/21  Yes Ladell Pier,  MD  Continuous Blood Gluc Sensor (DEXCOM G6 SENSOR) MISC Use as directed to check blood sugar 5-6 times daily. 08/20/21  Yes Susy Frizzle, MD  Continuous Blood Gluc Transmit (DEXCOM G6 TRANSMITTER) MISC USE AS DIRECTED TO CHECK BLOOD SUGAR 5-6 TIMES DAILY *needs office visit* 11/05/21 11/05/22 Yes Susy Frizzle, MD  FLUoxetine (PROZAC) 20 MG capsule Take 1 capsule (20 mg total) by mouth  daily. 09/18/21  Yes Susy Frizzle, MD  insulin degludec (TRESIBA FLEXTOUCH) 100 UNIT/ML FlexTouch Pen Inject 6 units under the skin once daily. 11/06/21  Yes   Insulin Disposable Pump (OMNIPOD 5 G6 POD, GEN 5,) MISC Use as directed every 48 hours 09/07/21  Yes   insulin lispro (HUMALOG) 100 UNIT/ML injection Inject 200 units/day under the skin via pump 05/15/21  Yes   Insulin Pen Needle (UNIFINE PENTIPS) 32G X 4 MM MISC Use as directed 11/06/21  Yes   Insulin Syringe 27G X 1/2" 0.5 ML MISC Use as directed to inject insulin SQ Q1D. 01/31/20  Yes Susy Frizzle, MD  Lancets Thin MISC Check BS BID 08/29/17  Yes Susy Frizzle, MD  lidocaine-prilocaine (EMLA) cream APPLY 1 APPLICATION TOPICALLY AS DIRECTED. APPLY TO PORT SITE 1-2 HOURS PRIOR TO STICK AND COVER WITH PLASTIC WRAP. 07/03/21 07/03/22 Yes Ladell Pier, MD  loratadine (CLARITIN) 10 MG tablet Take 10 mg by mouth as needed for allergies.   Yes [provider]  LORazepam (ATIVAN) 0.5 MG tablet Take 1 tablet (0.5 mg total) by mouth at bedtime as needed for anxiety. 11/05/21  Yes Ladell Pier, MD  prochlorperazine (COMPAZINE) 10 MG tablet TAKE 1 TABLET BY MOUTH EVERY 6 HOURS AS NEEDED FOR NAUSEA 10/09/21 10/09/22 Yes Ladell Pier, MD  Senna (SENOKOT LAXATIVE GUMMIES PO) Take 2 tablets by mouth daily at 12 noon.   Yes [provider]  spironolactone (ALDACTONE) 25 MG tablet Take 1 tablet (25 mg total) by mouth daily. 10/10/21  Yes Owens Shark, NP  blood glucose meter kit and supplies KIT Fasting prior to each meal three times a day 08/25/19   Ishmael Holter A, FNP  Blood Glucose Monitoring Suppl (FREESTYLE LITE) w/Device KIT Use as directed. 11/06/21     Glucagon (BAQSIMI TWO PACK) 3 MG/DOSE POWD as directed Nasally Once a day PRN 30 days Patient not taking: Reported on 11/05/2021 05/15/21     glucose blood test strip Dispense based on patient and insurance preference.  Use twice daily as directed. (FOR ICD-10 E11.65)  08/29/17   Susy Frizzle, MD  Hydrocod Polst-Chlorphen Polst (CHLORPHENIRAMINE-HYDROCODONE) 10-8 MG/5ML Take 5 milliliters by mouth every 12 (twelve) hours as needed for cough. Patient not taking: Reported on 11/05/2021 08/20/21   Ladell Pier, MD  oxyCODONE (OXY IR/ROXICODONE) 5 MG immediate release tablet TAKE 1 TABLET BY MOUTH EVERY 6 HOURS AS NEEDED FOR SEVERE PAIN. Patient not taking: Reported on 11/15/2021 10/22/21 04/20/22  Owens Shark, NP  traMADol (ULTRAM) 50 MG tablet TAKE 1 TABLET BY MOUTH EVERY 6 HOURS AS NEEDED FOR MODERATE PAIN (COUGH). Patient not taking: Reported on 11/05/2021 08/20/21 02/16/22  Ladell Pier, MD  TRUEPLUS LANCETS 33G MISC 1 each by Does not apply route 2 (two) times daily. 07/06/13   Susy Frizzle, MD   Current Facility-Administered Medications  Medication Dose Route Frequency Provider Last Rate Last Admin   0.9 %  sodium chloride infusion   Intravenous Continuous Marylyn Ishihara, Tyrone A, DO 100 mL/hr at 11/16/21 0143 New  Bag at 11/16/21 0143   insulin aspart (novoLOG) injection 10 Units  10 Units Intravenous Once Dwyane Dee, MD       And   dextrose 50 % solution 25 g  25 g Intravenous Once Dwyane Dee, MD       insulin aspart (novoLOG) injection 0-9 Units  0-9 Units Subcutaneous TID WC Kyle, Tyrone A, DO       LORazepam (ATIVAN) injection 1 mg  1 mg Intravenous Q6H PRN Marylyn Ishihara, Tyrone A, DO       magic mouthwash w/lidocaine  5 mL Oral Q6H Raenette Rover, NP   5 mL at 11/16/21 0616   micafungin (MYCAMINE) 150 mg in sodium chloride 0.9 % 100 mL IVPB  150 mg Intravenous Q24H Marylyn Ishihara, Tyrone A, DO 107.5 mL/hr at 11/15/21 1732 150 mg at 11/15/21 1732   ondansetron (ZOFRAN) injection 4 mg  4 mg Intravenous Q6H PRN Marylyn Ishihara, Tyrone A, DO       phenol (CHLORASEPTIC) mouth spray 1 spray  1 spray Mouth/Throat PRN Marylyn Ishihara, Tyrone A, DO       sodium zirconium cyclosilicate (LOKELMA) packet 10 g  10 g Oral Once Dwyane Dee, MD       traMADol Veatrice Bourbon) tablet 50 mg  50 mg Oral  Q8H PRN Marylyn Ishihara, Tyrone A, DO       Facility-Administered Medications Ordered in Other Encounters  Medication Dose Route Frequency Provider Last Rate Last Admin   sodium chloride flush (NS) 0.9 % injection 10 mL  10 mL Intravenous PRN Ladell Pier, MD   10 mL at 02/16/21 1034   sodium chloride flush (NS) 0.9 % injection 10 mL  10 mL Intravenous PRN Ladell Pier, MD   10 mL at 05/28/21 1057   Allergies as of 11/15/2021 - Review Complete 11/15/2021  Allergen Reaction Noted   Niaspan [niacin] Other (See Comments) 10/12/2012   Family History  Problem Relation Age of Onset   Cancer Other    Hyperlipidemia Other    Hypertension Brother    Asthma Son    Lung cancer Maternal Grandfather    Mesothelioma Maternal Grandfather        asbestos exposure   Rheum arthritis Maternal Grandmother    Breast cancer Maternal Grandmother 40   Hodgkin's lymphoma Mother 54       30 lb tumor removed from abdomen   Cirrhosis Paternal Grandmother    Other Maternal Great-grandfather        black lung, worked in [place] (MGF's father)   Social History   Socioeconomic History   Marital status: Married    Spouse name: Not on file   Number of children: Not on file   Years of education: Not on file   Highest education level: Not on file  Occupational History   Not on file  Tobacco Use   Smoking status: Former    Packs/day: 3.00    Years: 6.00    Total pack years: 18.00    Types: Cigarettes    Quit date: 02/05/1999    Years since quitting: 22.7   Smokeless tobacco: Never  Vaping Use   Vaping Use: Never used  Substance and Sexual Activity   Alcohol use: No    Alcohol/week: 0.0 standard drinks of alcohol   Drug use: No   Sexual activity: Not on file  Other Topics Concern   Not on file  Social History Narrative   Not on file   Social Determinants of Radio broadcast assistant  Strain: Low Risk  (01/18/2020)   Overall Financial Resource Strain (CARDIA)    Difficulty of Paying Living  Expenses: Not hard at all  Food Insecurity: No Food Insecurity (11/15/2021)   Hunger Vital Sign    Worried About Running Out of Food in the Last Year: Never true    Ran Out of Food in the Last Year: Never true  Transportation Needs: No Transportation Needs (11/15/2021)   PRAPARE - Hydrologist (Medical): No    Lack of Transportation (Non-Medical): No  Physical Activity: Not on file  Stress: Stress Concern Present (01/18/2020)   [place]    Feeling of Stress : To some extent  Social Connections: Socially Integrated (01/18/2020)   Social Connection and Isolation Panel [NHANES]    Frequency of Communication with Friends and Family: More than three times a week    Frequency of Social Gatherings with Friends and Family: More than three times a week    Attends Religious Services: More than 4 times per year    Active Member of [employer] or Organizations: Yes    Attends Music therapist: More than 4 times per year    Marital Status: Married  Human resources officer Violence: Not At Risk (11/15/2021)   Humiliation, Afraid, Rape, and Kick questionnaire    Fear of Current or Ex-Partner: No    Emotionally Abused: No    Physically Abused: No    Sexually Abused: No   Review of Systems:  Review of Systems  Constitutional:  Positive for weight loss.  Respiratory:  Negative for shortness of breath.   Cardiovascular:  Positive for chest pain.  Gastrointestinal:  Positive for constipation and heartburn. Negative for abdominal pain, blood in stool and diarrhea.       Odynophagia and dysphagia    OBJECTIVE:   Temp:  [97.4 F (36.3 C)-98.7 F (37.1 C)] 97.4 F (36.3 C) (10/13 0902) Pulse Rate:  [77-95] 77 (10/13 0902) Resp:  [15-20] 18 (10/13 0902) BP: (103-125)/(65-78) 119/68 (10/13 0902) SpO2:  [95 %-97 %] 97 % (10/13 0902) Last BM Date : 11/13/21 (pt's wife stated the patient's last BM is two  days ago.) Physical Exam Constitutional:      General: He is awake. He is not in acute distress.    Appearance: He is underweight. He is ill-appearing. He is not toxic-appearing.  HENT:     Mouth/Throat:     Comments: Limited view of oropharynx, tongue with white plaque. Eyes:     General: Scleral icterus present.  Cardiovascular:     Rate and Rhythm: Normal rate and regular rhythm.  Pulmonary:     Effort: No respiratory distress.     Breath sounds: Normal breath sounds.     Comments: No supplemental oxygen in place at time of exam Abdominal:     General: Bowel sounds are normal. There is distension.     Palpations: Abdomen is soft.     Tenderness: There is abdominal tenderness.     Comments: Pleurx drain noted in left abdomen, interventional radiology provider in room.  Neurological:     Mental Status: He is lethargic.  Psychiatric:        Behavior: Behavior is cooperative.     Labs: Recent Labs    11/15/21 0924 11/16/21 0538  WBC 2.2* 4.8  HGB 13.8 12.6*  HCT 37.9* 35.6*  PLT 109* 142*   BMET Recent Labs    11/15/21 1734 11/16/21  0012 11/16/21 0538  NA 121* 122* 122*  K 6.2* 5.9* 5.6*  CL 90* 92* 91*  CO2 _0 GLUCOSE 117* 145* 136*  BUN 63* 64* 69*  CREATININE 1.54* 1.68* 1.77*  CALCIUM 10.0 9.6 9.4   LFT Recent Labs    11/15/21 0924 11/15/21 1734 11/16/21 0538  PROT 6.1*  --   --   ALBUMIN 2.5*   < > 1.9*  AST 91*  --   --   ALT 56*  --   --   ALKPHOS 438*  --   --   BILITOT 8.8*  --   --    < > = values in this interval not displayed.   PT/INR No results for input(s): "LABPROT", "INR" in the last 72 hours.  Diagnostic imaging: US Abdomen Complete  Result Date: 11/15/2021 CLINICAL DATA:  Acute kidney injury, ascites, history of cholangiocarcinoma EXAM: ABDOMEN ULTRASOUND COMPLETE COMPARISON:  CT abdomen/pelvis dated 09/06/2021 FINDINGS: Gallbladder: Gallbladder wall thickening, measuring 6 mm, likely related to underdistention. No  cholelithiasis. Common bile duct: Not visualized. Liver: Multiple hepatic metastases, measuring up to 4.8 cm in the left hepatic lobe. Portal vein is patent on color Doppler imaging with normal direction of blood flow towards the liver. IVC: Poorly visualized. Pancreas: Poorly visualized. Spleen: Size and appearance within normal limits. Right Kidney: Length: 14.9 cm. Echogenic renal parenchyma. No mass or hydronephrosis. Left Kidney: Length: 11.8 cm. Echogenic renal parenchyma. No mass or hydronephrosis. Abdominal aorta: No aneurysm visualized. Other findings: Abdominal ascites. IMPRESSION: Multiple hepatic metastases, measuring up to 4.8 cm in the left hepatic lobe. Abdominal ascites. Echogenic renal parenchyma, suggesting medical renal disease. No hydronephrosis. Electronically Signed   By: Julian Hy M.D.   On: 11/15/2021 20:23   DG Chest 2 View  Result Date: 11/15/2021 CLINICAL DATA:  46 year old male with cough and sore throat. Productive cough. Cholangiocarcinoma with malignant ascites. EXAM: CHEST - 2 VIEW COMPARISON:  CT Chest, Abdomen, and Pelvis 07/13/2021 and earlier. FINDINGS: AP semi upright view at upright AP and lateral views of the chest at 0905 hours. Lower lung volumes. Stable right chest power port. Mediastinal contours are stable and within normal limits. A battery device external to the chest is visible only on the lateral view. Visualized tracheal air column is within normal limits. No pneumothorax, pulmonary edema, pleural effusion or confluent pulmonary opacity. Nonobstructed visible bowel gas pattern. No acute osseous abnormality identified. IMPRESSION: Low lung volumes.  No acute cardiopulmonary abnormality. Electronically Signed   By: Genevie Ann M.D.   On: 11/15/2021 09:21    IMPRESSION: Odynophagia Dysphagia Atypical chest pain Metastatic pancreaticobiliary adenocarcinoma, cholangiocarcinoma  -Diagnosed on liver biopsy 01/19/2020, currently on  chemotherapy Hyponatremia Acute kidney injury Normocytic anemia, no signs of active GI blood loss  PLAN: -Suspect Candida esophagitis as causation of patient's odynophagia and dysphagia given clinical presentation -Agree with antifungal therapy -Magic mouthwash and Carafate suspension as ordered for further symptom relief -Okay for clear liquid diet -Discussed role of EGD for this patient, mostly if diagnosis is in question -Favor empiric treatment of Candida esophagitis for now with clear liquid diet -Monitor clinical symptoms, n.p.o. at midnight in case symptoms worsen and EGD necessary 11/17/2021 -This plan was discussed with patient and his wife at bedside, no questions -GI will follow   LOS: 1 day   Danton Clap, DO [facility]

## 2021-11-16 NOTE — Assessment & Plan Note (Addendum)
-   During work-up in the ER, patient was noted to have thrush with associated odynophagia raising concern for esophageal candidiasis -Evaluated by ID as well.  Patient considered okay for using fluconazole with routine monitoring of LFTs; transitioned to fluconazole on 11/18/2021 -Given some improvement in odynophagia, diagnosis will be considered esophageal candidiasis and given his underlying immunocompromise state/malignancy, treatment course would likely be 28 days -Patient and wife are declining EGD at this time as well -If patient does return with recurrent odynophagia, will likely need EGD for more direct evaluation

## 2021-11-16 NOTE — Assessment & Plan Note (Addendum)
-   due to cholangiocarcinoma with liver mets -Patient was dependent on recurrent paracentesis; ultimately he underwent peritoneal Pleurx catheter placement (performed on 10/31/21). Patient aware this is only typically done in a hospice level context - has had some issues with leaking around cath site lately; s/p pursestring suture placement on 10/3 -Radiology recommending drainage of Pleurx routinely to avoid further risk of leakage (pressure buildup); and this speaks to indication for catheter placement especially in setting of typically hospice level patients -Ongoing GOC discussions will need to continue especially if leaking persists

## 2021-11-16 NOTE — Progress Notes (Signed)
Progress Note    Matthew Osborne   EPP:[id number]  DOB: January 07, 1976  DOA: 11/15/2021     1 PCP: Susy Frizzle, MD  Initial CC: cough, pain with swallowing   Hospital Course: Matthew Osborne is a 46 yo male with PMH cholangiocarcinoma (mets to liver), recurrent abdominal ascites (s/p peritoneal PleurX on 10/31/21), hyperCa, anxiety, DMII, HLD, HTN who presented with multiple complaints including thick frothy productive cough and ongoing increasing difficulty with swallowing.  He was describing painful swallowing as well as newer onset indigestion.  He has been taking Magic mouthwash with lidocaine which is the only thing he says that helps typically for his recent complaints of odynophagia. He was requiring consistent paracentesis prior to placement of Pleurx catheter.  He is not on hospice level care and Pleurx catheter placement was pursued mostly by patient and wife request.  She has been draining approximately 1 L daily since placement but some days less than 1 L.  On work-up in the ER, patient was noted to have significant thrush with further concern for possible esophageal candidiasis.  Case was briefly discussed with ID and due to hepatic metastasis/abnormal LFTs, patient was placed on micafungin and admitted for further work-up and GI evaluation.  With further work-up, he also had multiple metabolic derangements.  Interval History:  Seen this morning with wife present bedside.  Very long discussion held regarding overall functional status along with what their thoughts are regarding ongoing cancer treatments.  Furthermore, patient was having significant abdominal distention and early satiety prior to Pleurx catheter placement, however since placement, he is also having difficulties with still oral intake and most recently has developed this productive frothy cough and difficulty swallowing. Multiple metabolic derangements noted on work-up which we all reviewed in detail along with proposed  treatment for them. Patient's main concern this morning was his "burning pain" and asking for indigestion medicine.  He also endorses that the Magic mouthwash with lidocaine seems to help the most with his odynophagia.  Assessment and Plan: * Thrush - During work-up in the ER, patient was noted to have thrush with associated odynophagia raising concern for esophageal candidiasis -Case was briefly discussed with ID and patient was started on micafungin in setting of abnormal liver function -GI following as well, appreciate assistance.  Possible need for EGD for further evaluation of odynophagia and underlying diagnosis -Continue micafungin for now  Odynophagia - See thrush work-up as well -If no underlying esophageal Candida, other considered differential includes referred pain from underlying cholangiocarcinoma -Patient states MMW with lidocaine has helped, continue this -Add on Maalox and Bentyl as patient also describing "burning pain" -Protonix IV daily; continue Carafate  Hyperkalemia - etiology likely AKI - K 6.2 on admission, s/p treatment - mild elevation this am at 5.6; wife hesitant on full treatment and requesting lokelma only first (discontinue insulin/D50) - repeat BMP this evening and daily  Neutropenia (HCC) - likely due to malignancy/chemo use  AKI (acute kidney injury) ([place]) - baseline creatinine ~ 0.8 - 1 - patient presents with increase in creat >0.3 mg/dL above baseline, creat increase >1.5x baseline presumed to have occurred within past 7 days PTA - etiology suspected due to pre-renal from poor PO intake; also contribution from hypoperfusion with hypoalbuminemia in setting of ongoing PleurX cath use/drainage which will increase risk of ATN (I have emphasized to wife/patient that PleurX is beneficial moreso in a setting of hospice and may be making things worse now currently) - continue IVF; add on albumin  to help with 3rd spacing - check FeNa - renal u/s negative  for obstruction or hydro   Hyponatremia - multifactorial etiology (malignancy, hypovolemia from poor intake and PleurX losses) - baseline around ~128-130 - Na 119 on admission - follow up FeNa - continue IVF - trend BMP  Cholangiocarcinoma metastatic to liver [facility]) - follows with Dr. Benay Spice - has been recently on FOLFIRI - per last CT A/P on 09/06/21: " Continued progression of liver metastases.  Necrotic metastatic lymphadenopathy in the periportal region shows no substantial change." - overall, it appears prognosis continues to remain guarded at best and likely poor. Now with peritoneal PleurX cath he's likely to start causing further metabolic derangements causing further complications; I've reinforced with patient and wife that PleurX is typically used in context of hospice/end-of-life care settings  Malignant ascites - due to cholangiocarcinoma with liver mets -Patient was dependent on recurrent paracentesis and despite recommendations to avoid unless hospice context, patient and wife were insistent on peritoneal Pleurx catheter placement (performed on 10/31/21) - has had some issues with leaking around cath site lately; s/p pursestring suture placement on 10/3 -Radiology recommending drainage of Pleurx routinely to avoid further risk of leakage; and this speaks to indication for catheter placement especially in setting of typically hospice level patient's -Ongoing GOC discussions will need to continue especially if leaking persists  Hypercalcemia - likely multifactorial in setting of malignancy but also hypovolemia - improved with IVF; corrected Ca today still 11.1 - continue IVF and albumin  - repeat CMP in am  DMII (diabetes mellitus, type 2) ([place]) - last A1c 5.7% on 11/15/21 - patient reported to have insulin pump; settings need to be verified - given his poor diet/intake, unsure how safe it is to allow pump to be continued in hospital; will decide further on use once  settings are reviewed  Anxiety - continue ativan    Old records reviewed in assessment of this patient  Antimicrobials: Micafungin 11/15/2021 >> current   DVT prophylaxis:  SCDs Start: 11/15/21 1757   Code Status:   Code Status: Full Code  Mobility Assessment (last 72 hours)     Mobility Assessment     Row Name 11/15/21 2031 11/15/21 1845         Does patient have an order for bedrest or is patient medically unstable No - Continue assessment No - Continue assessment      What is the highest level of mobility based on the progressive mobility assessment? Level 3 (Stands with assist) - Balance while standing  and cannot march in place Level 3 (Stands with assist) - Balance while standing  and cannot march in place      Is the above level different from baseline mobility prior to current illness? Yes - Recommend PT order No - Consider discontinuing PT/OT               Barriers to discharge: none Disposition Plan:  Home Status is: Inpt  Objective: Blood pressure 109/71, pulse 88, temperature (!) 97.4 F (36.3 C), resp. rate 18, height '6\' 2"'$  (1.88 m), weight 83.9 kg, SpO2 100 %.  Examination:  Physical Exam Constitutional:      Comments: Chronically ill-appearing adult man sitting in recliner appearing uncomfortable from painful swallowing and is not wanting to speak much  HENT:     Head: Normocephalic and atraumatic.     Mouth/Throat:     Mouth: Mucous membranes are dry.     Comments: Patient unable to open mouth  much but no obvious thrush noted on tongue Eyes:     General: Scleral icterus present.     Extraocular Movements: Extraocular movements intact.  Cardiovascular:     Rate and Rhythm: Normal rate and regular rhythm.     Heart sounds: Normal heart sounds.  Pulmonary:     Effort: Pulmonary effort is normal. No respiratory distress.     Breath sounds: Normal breath sounds. No wheezing.  Abdominal:     General: Bowel sounds are normal. There is no  distension.     Palpations: Abdomen is soft.     Tenderness: There is no abdominal tenderness.     Comments: Abdominal Pleurx catheter noted  Musculoskeletal:        General: Normal range of motion.     Cervical back: Normal range of motion and neck supple.  Lymphadenopathy:     Cervical: No cervical adenopathy.  Skin:    General: Skin is warm and dry.  Neurological:     General: No focal deficit present.  Psychiatric:        Mood and Affect: Mood normal.        Behavior: Behavior normal.      Consultants:  GI  Procedures:    Data Reviewed: Results for orders placed or performed during the hospital encounter of 11/15/21 (from the past 24 hour(s))  Renal function panel     Status: Abnormal   Collection Time: 11/15/21  5:34 PM  Result Value Ref Range   Sodium 121 (L) 135 - 145 mmol/L   Potassium 6.2 (H) 3.5 - 5.1 mmol/L   Chloride 90 (L) 98 - 111 mmol/L   CO2 22 22 - 32 mmol/L   Glucose, Bld 117 (H) 70 - 99 mg/dL   BUN 63 (H) 6 - 20 mg/dL   Creatinine, Ser 1.54 (H) 0.61 - 1.24 mg/dL   Calcium 10.0 8.9 - 10.3 mg/dL   Phosphorus 5.0 (H) 2.5 - 4.6 mg/dL   Albumin 2.0 (L) 3.5 - 5.0 g/dL   GFR, Estimated 56 (L) >60 mL/min   Anion gap 9 5 - 15  Hemoglobin A1c     Status: Abnormal   Collection Time: 11/15/21  6:22 PM  Result Value Ref Range   Hgb A1c MFr Bld 5.7 (H) 4.8 - 5.6 %   Mean Plasma Glucose 116.89 mg/dL  Osmolality, urine     Status: None   Collection Time: 11/15/21  9:11 PM  Result Value Ref Range   Osmolality, Ur 732 300 - 900 mOsm/kg  Sodium, urine, random     Status: None   Collection Time: 11/15/21  9:11 PM  Result Value Ref Range   Sodium, Ur <10 mmol/L  Renal function panel     Status: Abnormal   Collection Time: 11/16/21 12:12 AM  Result Value Ref Range   Sodium 122 (L) 135 - 145 mmol/L   Potassium 5.9 (H) 3.5 - 5.1 mmol/L   Chloride 92 (L) 98 - 111 mmol/L   CO2 22 22 - 32 mmol/L   Glucose, Bld 145 (H) 70 - 99 mg/dL   BUN 64 (H) 6 - 20 mg/dL    Creatinine, Ser 1.68 (H) 0.61 - 1.24 mg/dL   Calcium 9.6 8.9 - 10.3 mg/dL   Phosphorus 5.1 (H) 2.5 - 4.6 mg/dL   Albumin 1.8 (L) 3.5 - 5.0 g/dL   GFR, Estimated 50 (L) >60 mL/min   Anion gap 8 5 - 15  Glucose, capillary     Status: Abnormal  Collection Time: 11/16/21  4:36 AM  Result Value Ref Range   Glucose-Capillary 114 (H) 70 - 99 mg/dL  Renal function panel     Status: Abnormal   Collection Time: 11/16/21  5:38 AM  Result Value Ref Range   Sodium 122 (L) 135 - 145 mmol/L   Potassium 5.6 (H) 3.5 - 5.1 mmol/L   Chloride 91 (L) 98 - 111 mmol/L   CO2 23 22 - 32 mmol/L   Glucose, Bld 136 (H) 70 - 99 mg/dL   BUN 69 (H) 6 - 20 mg/dL   Creatinine, Ser 1.77 (H) 0.61 - 1.24 mg/dL   Calcium 9.4 8.9 - 10.3 mg/dL   Phosphorus 5.2 (H) 2.5 - 4.6 mg/dL   Albumin 1.9 (L) 3.5 - 5.0 g/dL   GFR, Estimated 47 (L) >60 mL/min   Anion gap 8 5 - 15  HIV Antibody (routine testing w rflx)     Status: None   Collection Time: 11/16/21  5:38 AM  Result Value Ref Range   HIV Screen 4th Generation wRfx Non Reactive Non Reactive  CBC     Status: Abnormal   Collection Time: 11/16/21  5:38 AM  Result Value Ref Range   WBC 4.8 4.0 - 10.5 K/uL   RBC 3.76 (L) 4.22 - 5.81 MIL/uL   Hemoglobin 12.6 (L) 13.0 - 17.0 g/dL   HCT 35.6 (L) 39.0 - 52.0 %   MCV 94.7 80.0 - 100.0 fL   MCH 33.5 26.0 - 34.0 pg   MCHC 35.4 30.0 - 36.0 g/dL   RDW 15.9 (H) 11.5 - 15.5 %   Platelets 142 (L) 150 - 400 K/uL   nRBC 0.0 0.0 - 0.2 %  Differential     Status: Abnormal   Collection Time: 11/16/21  5:38 AM  Result Value Ref Range   Neutrophils Relative % 39 %   Neutro Abs 1.9 1.7 - 7.7 K/uL   Lymphocytes Relative 22 %   Lymphs Abs 1.0 0.7 - 4.0 K/uL   Monocytes Relative 29 %   Monocytes Absolute 1.4 (H) 0.1 - 1.0 K/uL   Eosinophils Relative 1 %   Eosinophils Absolute 0.0 0.0 - 0.5 K/uL   Basophils Relative 0 %   Basophils Absolute 0.0 0.0 - 0.1 K/uL   WBC Morphology TOXIC GRANULATION    RBC Morphology POLYCHROMASIA  PRESENT    nRBC 0 0 /100 WBC   Immature Granulocytes 9 %   Abs Immature Granulocytes 0.42 (H) 0.00 - 0.07 K/uL  Glucose, capillary     Status: Abnormal   Collection Time: 11/16/21  7:22 AM  Result Value Ref Range   Glucose-Capillary 154 (H) 70 - 99 mg/dL  Glucose, capillary     Status: Abnormal   Collection Time: 11/16/21 11:27 AM  Result Value Ref Range   Glucose-Capillary 116 (H) 70 - 99 mg/dL   *Note: Due to a large number of results and/or encounters for the requested time period, some results have not been displayed. A complete set of results can be found in Results Review.    I have Reviewed nursing notes, Vitals, and Lab results since pt's last encounter. Pertinent lab results : see above I have ordered test including BMP, CBC, Mg I have reviewed the last note from staff over past 24 hours I have discussed pt's care plan and test results with nursing staff, case manager   LOS: 1 day   Dwyane Dee, MD [facility] 11/16/2021, 1:34 PM

## 2021-11-16 NOTE — Inpatient Diabetes Management (Signed)
Inpatient Diabetes Program Recommendations  AACE/ADA: New Consensus Statement on Inpatient Glycemic Control (2015)  Target Ranges:  Prepandial:   less than 140 mg/dL      Peak postprandial:   less than 180 mg/dL (1-2 hours)      Critically ill patients:  140 - 180 mg/dL   Lab Results  Component Value Date   GLUCAP 116 (H) 11/16/2021   HGBA1C 5.7 (H) 11/15/2021    Review of Glycemic Control  Diabetes history: DM2 Outpatient Diabetes medications: OmniPod with Dexcom Current orders for Inpatient glycemic control: Novolog 0-9 units TID  Endo is Dr Suzette Battiest On CL diet, NPO after MN  Pump settings:  Basal - 5 units/H - 120 units Total Bolus - CHO ratio 1:8, Goal 120-130; CF 35  Inpatient Diabetes Program Recommendations:    Please order Insulin Pump Order Set D/C all SQ orders while pt has pump on.  Pump is on at present and blood sugars 154, 116 mg/dL.  Will need to sign pt contract and RN to complete assessment after insulin pump orders have been placed.   Pt and wife very knowledgeable regarding pump.  Continue to follow.  Thank you. Lorenda Peck, RD, LDN, [place] Inpatient Diabetes Coordinator [phone]

## 2021-11-16 NOTE — Assessment & Plan Note (Addendum)
-   etiology likely AKI - K 6.2 on admission, s/p treatment -Responded well to treatment -Spironolactone discontinued at discharge

## 2021-11-16 NOTE — Progress Notes (Addendum)
Initial Nutrition Assessment  DOCUMENTATION CODES:   Severe malnutrition in context of chronic illness  INTERVENTION:   -Boost Breeze po TID, each supplement provides 250 kcal and 9 grams of protein  -Prosource Plus PO BID, each provides 100 kcals and 15g protein Will start following diet advancement after EGD  -Recommend Ensure Plus High Protein po TID, each supplement provides 350 kcal and 20 grams of protein once diet is past clears.  NUTRITION DIAGNOSIS:   Severe Malnutrition related to chronic illness, cancer and cancer related treatments as evidenced by severe fat depletion, severe muscle depletion, energy intake < or equal to 75% for > or equal to 1 month, percent weight loss.  GOAL:   Patient will meet greater than or equal to 90% of their needs  MONITOR:   PO intake, Supplement acceptance, Labs, Weight trends, I & O's  REASON FOR ASSESSMENT:   Malnutrition Screening Tool    ASSESSMENT:   46 y.o. male with a history of cholangiocarcinoma, hypercalcemia, DM came in with sore throat and found to have thrush.  + dysphagia, odonophagia.  Decreased PO intake.  No fever.  Currently on chemotherapy.  Concern for esophageal involvement.  Patient in room, wife at bedside. Pt in bed, trying to sleep, cries out in pain intermittently stating "it's burning".  Per pt's wife, pt was eating normally until around the beginning of August 2023, he began to have increasing ascites, required weekly paracentesis. Pt would eat well the night after the procedure and the next morning and PO would progressively worsen afterwards d/t satiety and fullness. Pt tried to drink protein shakes during this time. At most was consuming a few bites of soups.   Pt then had pleuryx catheter placed. Last paracentesis was on 10/3. Report since pleuryx placed pt has had increased appetite and was wanting foods from various restaurants. Pt has good appetite now but has developed odynophagia over the past 5 days  and he is unable to swallow without pain and burning sensations. Currently being treated for thrush but EGD planned for 10/14 to evaluate cause of symptoms.  Pt's wife agreed to having Boost Breeze and Prosource ordered for protein supplements.   Pt's wife states his usual dry weight was ~230 lbs in August 2023.   Per weight records, pt has lost 44 lbs since 9/6 (19% wt loss x 1-2 months, significant for time frame).  Medications: Magic Mouthwash w/ lidocaine, Carafate, Lokelma  Labs reviewed: CBGs: 114-154 Low Na Elevated K Elevated Phos   NUTRITION - FOCUSED PHYSICAL EXAM:  Flowsheet Row Most Recent Value  Orbital Region Severe depletion  Upper Arm Region Moderate depletion  Thoracic and Lumbar Region Unable to assess  [pt c/o burning]  Buccal Region Severe depletion  Temple Region Severe depletion  Clavicle Bone Region Severe depletion  Clavicle and Acromion Bone Region Severe depletion  Scapular Bone Region Severe depletion  Dorsal Hand No depletion  Patellar Region Mild depletion  Anterior Thigh Region Mild depletion  Posterior Calf Region Mild depletion  Edema (RD Assessment) None  Hair Reviewed  Eyes Unable to assess  Mouth Unable to assess  [pt with thrush, c/o pain]  Skin Reviewed  Nails Reviewed       Diet Order:   Diet Order             Diet NPO time specified Except for: Ice Chips, Sips with Meds  Diet effective midnight           Diet clear liquid Room service  appropriate? Yes; Fluid consistency: Thin  Diet effective now                   EDUCATION NEEDS:   Education needs have been addressed  Skin:  Skin Assessment: Reviewed RN Assessment  Last BM:  10/10  Height:   Ht Readings from Last 1 Encounters:  11/15/21 '6\' 2"'$  (1.88 m)    Weight:   Wt Readings from Last 1 Encounters:  11/15/21 83.9 kg   BMI:  Body mass index is 23.75 kg/m.  Estimated Nutritional Needs:   Kcal:  2500-2700  Protein:  125-135g  Fluid:   2.5L/day  Clayton Bibles, MS, RD, LDN Inpatient Clinical Dietitian Contact information available via Amion

## 2021-11-16 NOTE — Consult Note (Addendum)
[facility] Nurse Consult Note: Reason for Consult:Assistance with Pleurex drainage system dressing.   Consulted by Bedside RN H. Wang.  I have communicated with the Bedside RN that the Bedside RN for day shift ( B. Aiken) with assistance from [employer] Marijean Bravo) as needed will manage this dressing change and obtain orders from Hospitalist MD today, as this is outside the purview of [facility].  [facility] nursing team will not follow, but will remain available to this patient, the nursing and medical teams as appropriate.  Maudie Flakes, MSN, RN, CNS, [place], Serita Grammes, [employer], [employer] phone:  [phone]

## 2021-11-16 NOTE — Assessment & Plan Note (Signed)
-   follows with Dr. Benay Spice - has been recently on FOLFIRI - per last CT A/P on 09/06/21: " Continued progression of liver metastases.  Necrotic metastatic lymphadenopathy in the periportal region shows no substantial change." - overall, it appears prognosis continues to remain guarded at best and likely poor. Now with peritoneal PleurX cath he's likely to start causing further metabolic derangements causing further complications; I've reinforced with patient and wife that PleurX is typically used in context of hospice/end-of-life care settings

## 2021-11-16 NOTE — Progress Notes (Signed)
Pt complain the sore throat pain and required magic mouth wash. MD notified; increased the frequency of magic mouth wash from 3 times a day to 4 times a day. RN ordered chloraseptic, pt stated "this medication can make him feel burning in his mouth."

## 2021-11-16 NOTE — Assessment & Plan Note (Addendum)
-   last A1c 5.7% on 11/15/21 -Insulin pump settings reviewed -Okay for patient to use insulin pump as long as no issues with glucose levels

## 2021-11-16 NOTE — Assessment & Plan Note (Signed)
-   likely due to malignancy/chemo use

## 2021-11-16 NOTE — Progress Notes (Signed)
IP PROGRESS NOTE  Subjective:   Matthew Osborne is well-known to me with a history of advanced cholangiocarcinoma.  He is currently being treated with salvage FOLFIRI chemotherapy.  He completed a cycle of FOLFIRI on 11/05/2021.  He reports nausea for 4 days following chemotherapy.  He presented to the emergency room yesterday with increased cough/secretions and burning throat/chest pain.  He reported mouth ulcers.  He was noted to have white "ulcerative plaques "in the posterior pharynx and tongue.  Lab studies revealed neutropenia, hyponatremia, hyperkalemia, and hyperbilirubinemia.  He continues to have burning discomfort at the anterior chest.  His oral intake has been limited.   Objective: Vital signs in last 24 hours: Blood pressure 125/73, pulse 85, temperature 97.6 F (36.4 C), temperature source Oral, resp. rate 18, height _0  (1.88 m), weight 185 lb (83.9 kg), SpO2 97 %.  Intake/Output from previous day: 10/12 0701 - 10/13 0700 In: 1218.3 [P.O.:240; I.V.:869.7; IV Piggyback:108.6] Out: 350 [Urine:350]  Physical Exam:  HEENT: Mild thrush at the buccal mucosa, tongue, and palate.  Small ulcer at the angle of the left lower lip.  No other discrete mouth ulcers. Lungs: Distant breath sounds, no respiratory distress Cardiac: Regular rate and rhythm Abdomen: Mildly distended with ascites, left abdomen peritoneal drain with a gauze dressing   Portacath/PICC-without erythema  Lab Results: Recent Labs    11/15/21 0924 11/16/21 0538  WBC 2.2* 4.8  HGB 13.8 12.6*  HCT 37.9* 35.6*  PLT 109* 142*    BMET Recent Labs    11/16/21 0012 11/16/21 0538  NA 122* 122*  K 5.9* 5.6*  CL 92* 91*  CO2 22 23  GLUCOSE 145* 136*  BUN 64* 69*  CREATININE 1.68* 1.77*  CALCIUM 9.6 9.4    Lab Results  Component Value Date   CEA1 2.15 01/14/2020   RXV400 209 (H) 09/24/2021    Studies/Results: US Abdomen Complete  Result Date: 11/15/2021 CLINICAL DATA:  Acute kidney injury,  ascites, history of cholangiocarcinoma EXAM: ABDOMEN ULTRASOUND COMPLETE COMPARISON:  CT abdomen/pelvis dated 09/06/2021 FINDINGS: Gallbladder: Gallbladder wall thickening, measuring 6 mm, likely related to underdistention. No cholelithiasis. Common bile duct: Not visualized. Liver: Multiple hepatic metastases, measuring up to 4.8 cm in the left hepatic lobe. Portal vein is patent on color Doppler imaging with normal direction of blood flow towards the liver. IVC: Poorly visualized. Pancreas: Poorly visualized. Spleen: Size and appearance within normal limits. Right Kidney: Length: 14.9 cm. Echogenic renal parenchyma. No mass or hydronephrosis. Left Kidney: Length: 11.8 cm. Echogenic renal parenchyma. No mass or hydronephrosis. Abdominal aorta: No aneurysm visualized. Other findings: Abdominal ascites. IMPRESSION: Multiple hepatic metastases, measuring up to 4.8 cm in the left hepatic lobe. Abdominal ascites. Echogenic renal parenchyma, suggesting medical renal disease. No hydronephrosis. Electronically Signed   By: Julian Hy M.D.   On: 11/15/2021 20:23   DG Chest 2 View  Result Date: 11/15/2021 CLINICAL DATA:  46 year old male with cough and sore throat. Productive cough. Cholangiocarcinoma with malignant ascites. EXAM: CHEST - 2 VIEW COMPARISON:  CT Chest, Abdomen, and Pelvis 07/13/2021 and earlier. FINDINGS: AP semi upright view at upright AP and lateral views of the chest at 0905 hours. Lower lung volumes. Stable right chest power port. Mediastinal contours are stable and within normal limits. A battery device external to the chest is visible only on the lateral view. Visualized tracheal air column is within normal limits. No pneumothorax, pulmonary edema, pleural effusion or confluent pulmonary opacity. Nonobstructed visible bowel gas pattern. No acute osseous  abnormality identified. IMPRESSION: Low lung volumes.  No acute cardiopulmonary abnormality. Electronically Signed   By: Genevie Ann M.D.   On:  11/15/2021 09:21    Medications: I have reviewed the patient's current medications.  Assessment/Plan:  Cholangiocarcinoma  multiple liver masses and abdominal lymphadenopathy CTs 01/13/2020-rounded hypodense mass appears to arise from the pancreas neck, multiple rim-enhancing masses in the liver, primarily left liver with segmental dilation of the left lobe Intermatic bile ducts, ill-defined hypodensity the central liver with effacement of the left portal vein, enlarged portacaval and retroperitoneal lymph nodes Ultrasound-guided biopsy of the left liver lesion 01/19/2020-adenocarcinoma, cytokeratin 7+, MSS, tumor mutation burden 1, IDH1 R132C MRI abdomen 01/31/2020-poorly marginated central liver mass, multiple smaller similar satellite liver masses, extrinsic mass-effect at the biliary hilum with intrahepatic biliary ductal dilatation throughout the left liver with mild Intermatic dilatation the superior right liver, no pancreas mass or ductal dilatation, normal spleen size, numerous enlarged enhancing lymph nodes at the porta hepatis, peripancreatic, portacaval, aortocaval, left periaortic chains Cycle 1 gemcitabine/cisplatin 02/04/2020 Cycle 2 gemcitabine/cisplatin 02/28/2020 Cycle 3 gemcitabine/cisplatin 03/20/2020 CT abdomen/pelvis 03/31/2020-mild decrease in central liver tumor, mild decrease in size of liver metastases and upper abdominal adenopathy, new splenomegaly Cycle 4 gemcitabine/cisplatin 04/10/2020 Cycle 5 gemcitabine/cisplatin 05/01/2020 Cycle 6 gemcitabine/cisplatin plus durvalumab 05/22/2020 CTs 06/06/2020- dominant central liver mass mildly enlarged, other liver lesions and abdominal adenopathy is stable, no evidence of metastatic disease to the chest Cycle 7 gemcitabine/cisplatin plus Durvalumab 06/12/2020 Cycle 8 gemcitabine/cisplatin plus Durvalumab 06/30/2020 Cycle 9 gemcitabine/cisplatin plus Durvalumab 07/31/2020 CT abdomen/pelvis 08/20/2020-decreased size of dominant central liver  mass, slight increase in size and stable additional liver lesions, stable portacaval node Cycle 10 gemcitabine/cisplatin plus Durvalumab 08/21/2020 Cycle 11 gemcitabine/cisplatin plus Durvalumab 09/11/2020 Cycle 12 gemcitabine/cisplatin plus Durvalumab 10/02/2020 Cycle 13 gemcitabine/cisplatin plus Durvalumab 10/23/2020 CT abdomen/pelvis 11/10/2020-no change in dominant central liver mass and additional liver lesions, stable upper retroperitoneal adenopathy Cycle 14 gemcitabine/cisplatin plus Durvalumab 11/13/2020 Cycle 15 gemcitabine/cisplatin plus Durvalumab 12/04/2020 Cycle 15 gemcitabine/cisplatin plus Durvalumab 01/08/2021 Cycle 16 gemcitabine/cisplatin plus Durvalumab 01/30/2021-cisplatin held from day 1 and day 8 secondary to neuropathy CTs 02/15/2021-slight increase in size of central liver mass, progressive metastatic lesions in the right liver, stable to slightly decreased periportal lymph nodes, progression of splenomegaly, chronically thrombosed left portal vein Cycle 1 FOLFOX 03/05/2021 Cycle 2 FOLFOX 03/19/2021, oxaliplatin dose reduced due to thrombocytopenia Cycle 3 FOLFOX 04/02/2021 Cycle 4 FOLFOX 04/16/2021 Cycle 5 FOLFOX 04/30/2021 CTs 05/10/2021-new and enlarging liver metastases, chronic left lobe biliary dilatation, chronic left portal vein thrombosis, stable porta hepatis adenopathy, splenomegaly, no evidence of metastatic disease to the chest Ivosidenib 05/21/2021 CT 07/13/2021-increase in size of periportal node, stable retroperitoneal nodes, dominant left hepatic mass-stable, increased necrosis of other liver lesions, some have increased in size Ivosidenib continued CT abdomen/pelvis 09/06/2021-progression of ascites, progression of liver metastases, stable necrotic lymphadenopathy in the periportal region Ivosedinib discontinued 09/07/2021 Cycle 1 FOLFIRI 09/24/2021 Cycle 2 held on 10/10/2021 due to neutropenia, elevated bilirubin Cycle 2 FOLFIRI 10/22/2021 Cycle 3 FOLFIRI 11/05/2021    Cough-likely related to diaphragmatic irritation from #1 Anorexia/weight loss Hypercalcemia-likely hypercalcemia malignancy, status post intravenous hydration and Zometa 01/14/2020 Diabetes Hyperlipidemia Hypertension History of peripheral neuropathy secondary to diabetes Severe back pain following Fulphila-oxycodone prescribed Thrombocytopenia secondary to chemotherapy-gemcitabine held with day 1 cycle 8 and then dose reduced COVID-19 infection 07/22/2020, 12/25/2020 Cisplatin induced peripheral neuropathy Admission 11/15/2021 with oropharyngeal candidiasis, mucositis?,  Neutropenia, and multiple electrolyte abnormalities     Matthew Osborne is now a day 12 following cycle  3 FOLFIRI.  He is admitted with oropharyngeal candidiasis, probable mucositis, and failure to thrive.  He has renal failure, electrolyte abnormalities, and the bilirubin is higher. I suspect the clinical presentation is in part related to toxicity from chemotherapy, intravascular depletion from lack of oral intake and frequent peritoneal drainage, and progression of the cholangiocarcinoma.  He has a poor overall prognosis.  I discussed the likelihood of changing to a comfort care approach with Mr. Lucia Gaskins and his wife this morning.  He is scheduled for follow-up at the Cancer center early next week.  I will see him in the hospital or office 11/19/2021 to continue this discussion.  He will not be a candidate for further systemic therapy unless his performance status and chemistry panel improve.  I will recommend hospice care if he does not improve.  The neutrophil count has recovered, hopefully this will allow for healing of any mucositis.  Recommendations: Continue intravenous hydration, hold on further drainage from the peritoneal catheter unless he develops tense ascites Treat hyperkalemia per the medical service Diabetes management per medical service Use Magic mouthwash without lidocaine Try Carafate slurry for the  esophageal burning Advance diet as tolerated Treat oropharyngeal candidiasis per pharmacy Please call oncology as needed.  I will check on him 11/19/2021 if he remains in the hospital.     LOS: 1 day   Betsy Coder, MD   11/16/2021, 6:53 AM

## 2021-11-17 DIAGNOSIS — B3781 Candidal esophagitis: Secondary | ICD-10-CM | POA: Diagnosis not present

## 2021-11-17 DIAGNOSIS — N179 Acute kidney failure, unspecified: Secondary | ICD-10-CM | POA: Diagnosis not present

## 2021-11-17 DIAGNOSIS — C221 Intrahepatic bile duct carcinoma: Secondary | ICD-10-CM | POA: Diagnosis not present

## 2021-11-17 DIAGNOSIS — R180 Malignant ascites: Secondary | ICD-10-CM | POA: Diagnosis not present

## 2021-11-17 DIAGNOSIS — R7989 Other specified abnormal findings of blood chemistry: Secondary | ICD-10-CM

## 2021-11-17 LAB — BLOOD CULTURE ID PANEL (REFLEXED) - BCID2

## 2021-11-17 LAB — CBC WITH DIFFERENTIAL/PLATELET
Abs Immature Granulocytes: 0.47 10*3/uL — ABNORMAL HIGH (ref 0.00–0.07)
Basophils Absolute: 0.1 10*3/uL (ref 0.0–0.1)
Basophils Relative: 2 %
Eosinophils Absolute: 0.1 10*3/uL (ref 0.0–0.5)
Eosinophils Relative: 1 %
HCT: 28.6 % — ABNORMAL LOW (ref 39.0–52.0)
Hemoglobin: 10.1 g/dL — ABNORMAL LOW (ref 13.0–17.0)
Immature Granulocytes: 9 %
Lymphocytes Relative: 18 %
Lymphs Abs: 0.9 10*3/uL (ref 0.7–4.0)
MCH: 33.6 pg (ref 26.0–34.0)
MCHC: 35.3 g/dL (ref 30.0–36.0)
MCV: 95 fL (ref 80.0–100.0)
Monocytes Absolute: 1.2 10*3/uL — ABNORMAL HIGH (ref 0.1–1.0)
Monocytes Relative: 24 %
Neutro Abs: 2.4 10*3/uL (ref 1.7–7.7)
Neutrophils Relative %: 46 %
Platelets: 114 10*3/uL — ABNORMAL LOW (ref 150–400)
RBC: 3.01 MIL/uL — ABNORMAL LOW (ref 4.22–5.81)
RDW: 16.2 % — ABNORMAL HIGH (ref 11.5–15.5)
WBC: 5.1 10*3/uL (ref 4.0–10.5)
nRBC: 0 % (ref 0.0–0.2)

## 2021-11-17 LAB — COMPREHENSIVE METABOLIC PANEL
ALT: 46 U/L — ABNORMAL HIGH (ref 0–44)
AST: 96 U/L — ABNORMAL HIGH (ref 15–41)
Albumin: 2.4 g/dL — ABNORMAL LOW (ref 3.5–5.0)
Alkaline Phosphatase: 339 U/L — ABNORMAL HIGH (ref 38–126)
Anion gap: 6 (ref 5–15)
BUN: 63 mg/dL — ABNORMAL HIGH (ref 6–20)
CO2: 24 mmol/L (ref 22–32)
Calcium: 9 mg/dL (ref 8.9–10.3)
Chloride: 94 mmol/L — ABNORMAL LOW (ref 98–111)
Creatinine, Ser: 1.44 mg/dL — ABNORMAL HIGH (ref 0.61–1.24)
GFR, Estimated: >60 mL/min (ref >60)
Glucose, Bld: 151 mg/dL — ABNORMAL HIGH (ref 70–99)
Potassium: 4.4 mmol/L (ref 3.5–5.1)
Sodium: 124 mmol/L — ABNORMAL LOW (ref 135–145)
Total Bilirubin: 4.2 mg/dL — ABNORMAL HIGH (ref 0.3–1.2)
Total Protein: 4.9 g/dL — ABNORMAL LOW (ref 6.5–8.1)

## 2021-11-17 LAB — CULTURE, BLOOD (ROUTINE X 2): Special Requests: ADEQUATE

## 2021-11-17 LAB — GLUCOSE, CAPILLARY
Glucose-Capillary: 136 mg/dL — ABNORMAL HIGH (ref 70–99)
Glucose-Capillary: 140 mg/dL — ABNORMAL HIGH (ref 70–99)
Glucose-Capillary: 145 mg/dL — ABNORMAL HIGH (ref 70–99)
Glucose-Capillary: 142 mg/dL — ABNORMAL HIGH (ref 70–99)

## 2021-11-17 LAB — MAGNESIUM: Magnesium: 2.2 mg/dL (ref 1.7–2.4)

## 2021-11-17 MED ORDER — POLYETHYLENE GLYCOL 3350 17 G PO PACK
17.0000 g | PACK | Freq: Every day | ORAL | Status: DC
  Administered 2021-11-17 – 2021-11-19 (×3): 17 g via ORAL
  Filled 2021-11-17 (×3): qty 1

## 2021-11-17 MED ORDER — INSULIN PUMP
Freq: Three times a day (TID) | SUBCUTANEOUS | Status: DC
  Administered 2021-11-19: 6 via SUBCUTANEOUS
  Filled 2021-11-17: qty 1

## 2021-11-17 MED ORDER — PANTOPRAZOLE 2 MG/ML SUSPENSION
40.0000 mg | Freq: Every day | ORAL | Status: DC
  Administered 2021-11-18 – 2021-11-19 (×2): 40 mg via ORAL
  Filled 2021-11-17 (×2): qty 20

## 2021-11-17 MED ORDER — FLUOXETINE HCL 20 MG PO CAPS
20.0000 mg | ORAL_CAPSULE | Freq: Every day | ORAL | Status: DC
  Administered 2021-11-17 – 2021-11-19 (×3): 20 mg via ORAL
  Filled 2021-11-17 (×3): qty 1

## 2021-11-17 NOTE — Progress Notes (Signed)
PHARMACY - PHYSICIAN COMMUNICATION CRITICAL VALUE ALERT - BLOOD CULTURE IDENTIFICATION (BCID)  Matthew Osborne is an 46 y.o. male who presented to [facility] on 11/15/2021 with a chief complaint of sore throat.  Assessment:   He is currently on antifungal therapy for esophageal candidiasis.  PMH significant for metastatic cholangiocarcinoma. He has been afebrile with WBC wNL since admission.  1/2 blood cx (only aerobic bottles collected) is growing GPC; BCID + S. Epi, no resistance detected. Likely contaminant.   Name of physician (or Provider) Contacted: A Chavez  Current antibiotics: none; on Micafungin  Changes to prescribed antibiotics recommended:  Patient is on recommended antibiotics - No changes needed Continue to monitor until cx data finalized.   Results for orders placed or performed during the hospital encounter of 11/15/21  Blood Culture ID Panel (Reflexed) (Collected: 11/15/2021  5:34 PM)  Result Value Ref Range   Enterococcus faecalis NOT DETECTED NOT DETECTED   Enterococcus Faecium NOT DETECTED NOT DETECTED   Listeria monocytogenes NOT DETECTED NOT DETECTED   Staphylococcus species DETECTED (A) NOT DETECTED   Staphylococcus aureus (BCID) NOT DETECTED NOT DETECTED   Staphylococcus epidermidis DETECTED (A) NOT DETECTED   Staphylococcus lugdunensis NOT DETECTED NOT DETECTED   Streptococcus species NOT DETECTED NOT DETECTED   Streptococcus agalactiae NOT DETECTED NOT DETECTED   Streptococcus pneumoniae NOT DETECTED NOT DETECTED   Streptococcus pyogenes NOT DETECTED NOT DETECTED   A.calcoaceticus-baumannii NOT DETECTED NOT DETECTED   Bacteroides fragilis NOT DETECTED NOT DETECTED   Enterobacterales NOT DETECTED NOT DETECTED   Enterobacter cloacae complex NOT DETECTED NOT DETECTED   Escherichia coli NOT DETECTED NOT DETECTED   Klebsiella aerogenes NOT DETECTED NOT DETECTED   Klebsiella oxytoca NOT DETECTED NOT DETECTED   Klebsiella pneumoniae NOT DETECTED NOT DETECTED    Proteus species NOT DETECTED NOT DETECTED   Salmonella species NOT DETECTED NOT DETECTED   Serratia marcescens NOT DETECTED NOT DETECTED   Haemophilus influenzae NOT DETECTED NOT DETECTED   Neisseria meningitidis NOT DETECTED NOT DETECTED   Pseudomonas aeruginosa NOT DETECTED NOT DETECTED   Stenotrophomonas maltophilia NOT DETECTED NOT DETECTED   Candida albicans NOT DETECTED NOT DETECTED   Candida auris NOT DETECTED NOT DETECTED   Candida glabrata NOT DETECTED NOT DETECTED   Candida krusei NOT DETECTED NOT DETECTED   Candida parapsilosis NOT DETECTED NOT DETECTED   Candida tropicalis NOT DETECTED NOT DETECTED   Cryptococcus neoformans/gattii NOT DETECTED NOT DETECTED   Methicillin resistance mecA/C NOT DETECTED NOT DETECTED    Netta Cedars PharmD 11/17/2021  12:36 AM

## 2021-11-17 NOTE — Progress Notes (Signed)
[facility] Progress Note  SUBJECTIVE:   Interval history: Matthew Osborne was seen and evaluated today at bedside.  His wife was present at bedside.  He notes having improvement in appetite yesterday and overnight, he was able to consume calories.  He remains with odynophagia though is able to swallow.  Constipation, no bowel movement for 5-6 days, requesting MiraLAX.  No plans for EGD.  Wife had question regarding hyperbilirubinemia and if related to chemotherapy versus other.  If not related to chemotherapy, other considerations include infiltrating process given known primary malignancy, could consider MRCP in the outpatient setting to further evaluate.  Past Medical History:  Diagnosis Date   Cancer ([place])    De Quervain's tenosynovitis, left 03/2011   Diabetes mellitus    NIDDM   Family history of breast cancer    Family history of Hodgkin's lymphoma    Hyperlipidemia    Hypertension    under control; has been on med. x 4 yrs.   Nonproliferative retinopathy due to secondary diabetes [facility])    Past Surgical History:  Procedure Laterality Date   CARPAL TUNNEL RELEASE     bilat.   DORSAL COMPARTMENT RELEASE  03/14/2011   Procedure: RELEASE DORSAL COMPARTMENT (DEQUERVAIN);  Surgeon: Paulene Floor, MD;  Location: [name];  Service: Orthopedics;  Laterality: Left;  left wrist APL and EPL tenosynovectomy and 1st dorsal compartment release   IR IMAGE GUIDED DRAINAGE PERCUT CATH  PERITONEAL RETROPERIT  10/31/2021   IR IMAGING GUIDED PORT INSERTION  02/01/2020   IR PARACENTESIS  09/10/2021   IR PARACENTESIS  09/21/2021   IR PARACENTESIS  09/27/2021   IR PARACENTESIS  10/03/2021   IR PARACENTESIS  10/12/2021   IR PARACENTESIS  10/19/2021   IR PARACENTESIS  10/25/2021   IR PARACENTESIS  11/06/2021   TONSILLECTOMY AND ADENOIDECTOMY  as a child   VASECTOMY     Current Facility-Administered Medications  Medication Dose Route Frequency Provider Last Rate Last Admin    (feeding supplement) PROSource Plus liquid 30 mL  30 mL Oral BID BM Dwyane Dee, MD   30 mL at 11/17/21 1010   0.9 %  sodium chloride infusion   Intravenous Continuous Kyle, Tyrone A, DO 100 mL/hr at 11/17/21 0804 New Bag at 11/17/21 0804   albumin human 25 % solution 25 g  25 g Intravenous Q6H Dwyane Dee, MD 60 mL/hr at 11/17/21 0818 25 g at 11/17/21 0818   alum & mag hydroxide-simeth (MAALOX/MYLANTA) 200-200-20 MG/5ML suspension 30 mL  30 mL Oral Q6H PRN Dwyane Dee, MD   30 mL at 11/16/21 1150   dicyclomine (BENTYL) 10 MG/5ML solution 10 mg  10 mg Oral TID PRN Dwyane Dee, MD       feeding supplement (BOOST / RESOURCE BREEZE) liquid 1 Container  1 Container Oral TID BM Dwyane Dee, MD   1 Container at 11/17/21 1010   FLUoxetine (PROZAC) capsule 20 mg  20 mg Oral Daily Dwyane Dee, MD   20 mg at 11/17/21 1212   insulin pump   Subcutaneous TID WC, HS, 0200 Dwyane Dee, MD   Given at 11/17/21 1214   LORazepam (ATIVAN) injection 1 mg  1 mg Intravenous Q6H PRN Marylyn Ishihara, Tyrone A, DO       magic mouthwash w/lidocaine  10 mL Oral QID PRN Dwyane Dee, MD   10 mL at 11/17/21 1019   micafungin (MYCAMINE) 150 mg in sodium chloride 0.9 % 100 mL IVPB  150 mg Intravenous Q24H  Cherylann Ratel A, DO 107.5 mL/hr at 11/16/21 1742 150 mg at 11/16/21 1742   ondansetron (ZOFRAN) injection 4 mg  4 mg Intravenous Q6H PRN Marylyn Ishihara, Tyrone A, DO       [START ON 11/18/2021] pantoprazole (PROTONIX) 2 mg/mL oral suspension 40 mg  40 mg Oral Daily Dwyane Dee, MD       phenol (CHLORASEPTIC) mouth spray 1 spray  1 spray Mouth/Throat PRN Marylyn Ishihara, Tyrone A, DO       polyethylene glycol (MIRALAX / GLYCOLAX) packet 17 g  17 g Oral Daily Danton Clap H, DO   17 g at 11/17/21 1019   sucralfate (CARAFATE) 1 GM/10ML suspension 1 g  1 g Oral TID WC & HS Ladell Pier, MD   1 g at 11/17/21 1212   traMADol (ULTRAM) tablet 50 mg  50 mg Oral Q8H PRN Cherylann Ratel A, DO       Facility-Administered Medications Ordered  in Other Encounters  Medication Dose Route Frequency Provider Last Rate Last Admin   sodium chloride flush (NS) 0.9 % injection 10 mL  10 mL Intravenous PRN Ladell Pier, MD   10 mL at 02/16/21 1034   sodium chloride flush (NS) 0.9 % injection 10 mL  10 mL Intravenous PRN Ladell Pier, MD   10 mL at 05/28/21 1057   Allergies as of 11/15/2021 - Review Complete 11/15/2021  Allergen Reaction Noted   Niaspan [niacin] Other (See Comments) 10/12/2012   Review of Systems:  Review of Systems  Respiratory:  Negative for shortness of breath.   Cardiovascular:  Negative for chest pain.  Gastrointestinal:  Negative for nausea and vomiting.       Odynophagia    OBJECTIVE:   Temp:  [97.4 F (36.3 C)-98.6 F (37 C)] 98.6 F (37 C) (10/14 0516) Pulse Rate:  [85-86] 85 (10/14 0516) Resp:  [18-20] 20 (10/14 0516) BP: (122-129)/(68-71) 122/68 (10/14 0516) SpO2:  [97 %-100 %] 97 % (10/14 0516) Last BM Date : 11/13/21 Physical Exam Constitutional:      Appearance: He is cachectic. He is ill-appearing.     Comments: Bitemporal wasting  Cardiovascular:     Rate and Rhythm: Normal rate and regular rhythm.  Pulmonary:     Effort: No respiratory distress.     Breath sounds: Normal breath sounds.  Abdominal:     General: Bowel sounds are normal. There is no distension.     Palpations: Abdomen is soft.     Tenderness: There is no abdominal tenderness. There is no guarding.     Comments: PleurX drain covered in left abdomen  Skin:    General: Skin is warm and dry.     Coloration: Skin is jaundiced.     Labs: Recent Labs    11/15/21 0924 11/16/21 0538 11/17/21 0432  WBC 2.2* 4.8 5.1  HGB 13.8 12.6* 10.1*  HCT 37.9* 35.6* 28.6*  PLT 109* 142* 114*   BMET Recent Labs    11/16/21 0538 11/16/21 1739 11/17/21 0432  NA 122* 122* 124*  K 5.6* 5.0 4.4  CL 91* 91* 94*  CO2 '23 22 24  '$ GLUCOSE 136* 192* 151*  BUN 69* 72* 63*  CREATININE 1.77* 1.89* 1.44*  CALCIUM 9.4 9.2 9.0    LFT Recent Labs    11/17/21 0432  PROT 4.9*  ALBUMIN 2.4*  AST 96*  ALT 46*  ALKPHOS 339*  BILITOT 4.2*   PT/INR No results for input(s): "LABPROT", "INR" in the last 72 hours. Diagnostic  imaging: US Abdomen Complete  Result Date: 11/15/2021 CLINICAL DATA:  Acute kidney injury, ascites, history of cholangiocarcinoma EXAM: ABDOMEN ULTRASOUND COMPLETE COMPARISON:  CT abdomen/pelvis dated 09/06/2021 FINDINGS: Gallbladder: Gallbladder wall thickening, measuring 6 mm, likely related to underdistention. No cholelithiasis. Common bile duct: Not visualized. Liver: Multiple hepatic metastases, measuring up to 4.8 cm in the left hepatic lobe. Portal vein is patent on color Doppler imaging with normal direction of blood flow towards the liver. IVC: Poorly visualized. Pancreas: Poorly visualized. Spleen: Size and appearance within normal limits. Right Kidney: Length: 14.9 cm. Echogenic renal parenchyma. No mass or hydronephrosis. Left Kidney: Length: 11.8 cm. Echogenic renal parenchyma. No mass or hydronephrosis. Abdominal aorta: No aneurysm visualized. Other findings: Abdominal ascites. IMPRESSION: Multiple hepatic metastases, measuring up to 4.8 cm in the left hepatic lobe. Abdominal ascites. Echogenic renal parenchyma, suggesting medical renal disease. No hydronephrosis. Electronically Signed   By: Julian Hy M.D.   On: 11/15/2021 20:23    IMPRESSION: Odynophagia, improved Dysphagia Atypical chest pain Metastatic pancreaticobiliary adenocarcinoma, cholangiocarcinoma             -Diagnosed on liver biopsy 01/19/2020, currently on chemotherapy Hyponatremia Acute kidney injury Normocytic anemia, no signs of active GI blood loss  PLAN: -Suspect Candida esophagitis as etiology of patient's odynophagia and dysphagia, appears to be improving with supportive therapy including antifungal therapy and Magic mouthwash, Carafate -No plans for endoscopic evaluation -Okay for diet from GI  standpoint -If hyperbilirubinemia work-up is requested, can consider MRCP in the outpatient setting -Gastroenterology team will respectfully sign off and be available for any questions that arise   LOS: 2 days   Danton Clap, [facility]

## 2021-11-17 NOTE — Progress Notes (Signed)
Progress Note    Matthew Osborne   CBJ:[id number]  DOB: May 26, 1975  DOA: 11/15/2021     2 PCP: Susy Frizzle, MD  Initial CC: cough, pain with swallowing   Hospital Course: [name] is a 46 yo male with PMH cholangiocarcinoma (mets to liver), recurrent abdominal ascites (s/p peritoneal PleurX on 10/31/21), hyperCa, anxiety, DMII, HLD, HTN who presented with multiple complaints including thick frothy productive cough and ongoing increasing difficulty with swallowing.  He was describing painful swallowing as well as newer onset indigestion.  He has been taking Magic mouthwash with lidocaine which is the only thing he says that helps typically for his recent complaints of odynophagia. He was requiring consistent paracentesis prior to placement of Pleurx catheter.  He is not on hospice level care and Pleurx catheter placement was pursued mostly by patient and wife request.  She has been draining approximately 1 L daily since placement but some days less than 1 L.  On work-up in the ER, patient was noted to have significant thrush with further concern for possible esophageal candidiasis.  Case was briefly discussed with ID and due to hepatic metastasis/abnormal LFTs, patient was placed on micafungin and admitted for further work-up and GI evaluation.  With further work-up, he also had multiple metabolic derangements.  Interval History:  No events overnight. Pain with swallowing has improved some. They declined EGD today.  Patient sleeping comfortably in bed, didn't feel like talking much.   Assessment and Plan: * Esophageal candidiasis ([place]) - During work-up in the ER, patient was noted to have thrush with associated odynophagia raising concern for esophageal candidiasis -Evaluated by ID as well.  Patient considered okay for using fluconazole with routine monitoring of LFTs; will plan to transition to fluconazole on 11/18/2021 -Given some improvement in odynophagia, diagnosis will be  considered esophageal candidiasis and given his underlying immunocompromise state/malignancy, treatment course would likely be 28 days -Patient and wife are declining EGD at this time as well  Odynophagia - See thrush/candidiasis work-up as well -Patient states MMW with lidocaine has helped, continue this -Add on Maalox and Bentyl as patient also describing "burning pain" -Protonix IV daily; continue Carafate  Neutropenia ([place]) - likely due to malignancy/chemo use  AKI (acute kidney injury) ([place]) - baseline creatinine ~ 0.8 - 1 - patient presents with increase in creat >0.3 mg/dL above baseline, creat increase >1.5x baseline presumed to have occurred within past 7 days PTA - etiology suspected due to pre-renal from poor PO intake; also contribution from hypoperfusion with hypoalbuminemia in setting of ongoing PleurX cath use/drainage which will increase risk of ATN (I have emphasized to wife/patient that PleurX is beneficial moreso in a setting of hospice and may be making things worse now currently) - continue IVF; add on albumin to help with 3rd spacing - check FeNa: <1%, consistent with prerenal  - renal u/s negative for obstruction or hydro   Hyponatremia - multifactorial etiology (malignancy, hypovolemia from poor intake and PleurX losses) - baseline around ~128-130 - Na 119 on admission - continue IVF - trend BMP  Cholangiocarcinoma metastatic to liver [facility]) - follows with Dr. Benay Spice - has been recently on FOLFIRI - per last CT A/P on 09/06/21: " Continued progression of liver metastases.  Necrotic metastatic lymphadenopathy in the periportal region shows no substantial change." - overall, it appears prognosis continues to remain guarded at best and likely poor. Now with peritoneal PleurX cath he's likely to start causing further metabolic derangements causing further complications; I've  reinforced with patient and wife that PleurX is typically used in context of  hospice/end-of-life care settings  Hyperkalemia-resolved as of 11/17/2021 - etiology likely AKI - K 6.2 on admission, s/p treatment -Responded well to treatment  Elevated LFTs - suspect some mild obstruction transiently due to underlying malignancy; possibly some hepatotoxicity but pattern looks cholestatic, but is improving  - will need LFT monitoring while on fluconazole as well   Malignant ascites - due to cholangiocarcinoma with liver mets -Patient was dependent on recurrent paracentesis and despite recommendations to avoid unless hospice context, patient and wife were insistent on peritoneal Pleurx catheter placement (performed on 10/31/21) - has had some issues with leaking around cath site lately; s/p pursestring suture placement on 10/3 -Radiology recommending drainage of Pleurx routinely to avoid further risk of leakage; and this speaks to indication for catheter placement especially in setting of typically hospice level patient's -Ongoing GOC discussions will need to continue especially if leaking persists  Hypercalcemia - likely multifactorial in setting of malignancy but also hypovolemia - improved with IVF - continue IVF and albumin  - repeat CMP in am  DMII (diabetes mellitus, type 2) ([place]) - last A1c 5.7% on 11/15/21 -Insulin pump settings reviewed -Okay for patient to use insulin pump as long as no issues with glucose levels  Anxiety - continue ativan    Old records reviewed in assessment of this patient  Antimicrobials: Micafungin 11/15/2021 >> current   DVT prophylaxis:  SCDs Start: 11/15/21 1757   Code Status:   Code Status: Full Code  Mobility Assessment (last 72 hours)     Mobility Assessment     Row Name 11/17/21 1007 11/16/21 2115 11/16/21 0900 11/15/21 2031 11/15/21 1845   Does patient have an order for bedrest or is patient medically unstable No - Continue assessment No - Continue assessment No - Continue assessment No - Continue assessment No  - Continue assessment   What is the highest level of mobility based on the progressive mobility assessment? Level 3 (Stands with assist) - Balance while standing  and cannot march in place Level 3 (Stands with assist) - Balance while standing  and cannot march in place Level 3 (Stands with assist) - Balance while standing  and cannot march in place Level 3 (Stands with assist) - Balance while standing  and cannot march in place Level 3 (Stands with assist) - Balance while standing  and cannot march in place   Is the above level different from baseline mobility prior to current illness? Yes - Recommend PT order Yes - Recommend PT order Yes - Recommend PT order Yes - Recommend PT order No - Consider discontinuing PT/OT            Barriers to discharge: none Disposition Plan:  Home Status is: Inpt  Objective: Blood pressure 120/66, pulse 88, temperature 98.1 F (36.7 C), temperature source Oral, resp. rate 18, height '6\' 2"'$  (1.88 m), weight 83.9 kg, SpO2 98 %.  Examination:  Physical Exam Constitutional:      Comments: Chronically ill-appearing adult man laying in bed appearing more comfortable and in no distress  HENT:     Head: Normocephalic and atraumatic.     Mouth/Throat:     Mouth: Mucous membranes are moist.     Comments: Patient unable to open mouth much but no obvious thrush noted on tongue Eyes:     General: Scleral icterus present.     Extraocular Movements: Extraocular movements intact.  Cardiovascular:  Rate and Rhythm: Normal rate and regular rhythm.     Heart sounds: Normal heart sounds.  Pulmonary:     Effort: Pulmonary effort is normal. No respiratory distress.     Breath sounds: Normal breath sounds. No wheezing.  Abdominal:     General: Bowel sounds are normal. There is no distension.     Palpations: Abdomen is soft.     Tenderness: There is no abdominal tenderness.     Comments: Abdominal Pleurx catheter noted  Musculoskeletal:        General: Normal range  of motion.     Cervical back: Normal range of motion and neck supple.  Lymphadenopathy:     Cervical: No cervical adenopathy.  Skin:    General: Skin is warm and dry.  Neurological:     General: No focal deficit present.  Psychiatric:        Mood and Affect: Mood normal.        Behavior: Behavior normal.      Consultants:  GI  Procedures:    Data Reviewed: Results for orders placed or performed during the hospital encounter of 11/15/21 (from the past 24 hour(s))  Glucose, capillary     Status: Abnormal   Collection Time: 11/16/21  5:07 PM  Result Value Ref Range   Glucose-Capillary 197 (H) 70 - 99 mg/dL  Basic metabolic panel     Status: Abnormal   Collection Time: 11/16/21  5:39 PM  Result Value Ref Range   Sodium 122 (L) 135 - 145 mmol/L   Potassium 5.0 3.5 - 5.1 mmol/L   Chloride 91 (L) 98 - 111 mmol/L   CO2 22 22 - 32 mmol/L   Glucose, Bld 192 (H) 70 - 99 mg/dL   BUN 72 (H) 6 - 20 mg/dL   Creatinine, Ser 1.89 (H) 0.61 - 1.24 mg/dL   Calcium 9.2 8.9 - 10.3 mg/dL   GFR, Estimated 44 (L) >60 mL/min   Anion gap 9 5 - 15  Creatinine, urine, random     Status: None   Collection Time: 11/16/21  7:50 PM  Result Value Ref Range   Creatinine, Urine 107 mg/dL  Glucose, capillary     Status: Abnormal   Collection Time: 11/16/21  9:28 PM  Result Value Ref Range   Glucose-Capillary 167 (H) 70 - 99 mg/dL  CBC with Differential/Platelet     Status: Abnormal   Collection Time: 11/17/21  4:32 AM  Result Value Ref Range   WBC 5.1 4.0 - 10.5 K/uL   RBC 3.01 (L) 4.22 - 5.81 MIL/uL   Hemoglobin 10.1 (L) 13.0 - 17.0 g/dL   HCT 28.6 (L) 39.0 - 52.0 %   MCV 95.0 80.0 - 100.0 fL   MCH 33.6 26.0 - 34.0 pg   MCHC 35.3 30.0 - 36.0 g/dL   RDW 16.2 (H) 11.5 - 15.5 %   Platelets 114 (L) 150 - 400 K/uL   nRBC 0.0 0.0 - 0.2 %   Neutrophils Relative % 46 %   Neutro Abs 2.4 1.7 - 7.7 K/uL   Lymphocytes Relative 18 %   Lymphs Abs 0.9 0.7 - 4.0 K/uL   Monocytes Relative 24 %    Monocytes Absolute 1.2 (H) 0.1 - 1.0 K/uL   Eosinophils Relative 1 %   Eosinophils Absolute 0.1 0.0 - 0.5 K/uL   Basophils Relative 2 %   Basophils Absolute 0.1 0.0 - 0.1 K/uL   WBC Morphology MILD LEFT SHIFT (1-5% METAS, OCC MYELO, OCC BANDS)  RBC Morphology MORPHOLOGY UNREMARKABLE    Immature Granulocytes 9 %   Abs Immature Granulocytes 0.47 (H) 0.00 - 0.07 K/uL  Comprehensive metabolic panel     Status: Abnormal   Collection Time: 11/17/21  4:32 AM  Result Value Ref Range   Sodium 124 (L) 135 - 145 mmol/L   Potassium 4.4 3.5 - 5.1 mmol/L   Chloride 94 (L) 98 - 111 mmol/L   CO2 24 22 - 32 mmol/L   Glucose, Bld 151 (H) 70 - 99 mg/dL   BUN 63 (H) 6 - 20 mg/dL   Creatinine, Ser 1.44 (H) 0.61 - 1.24 mg/dL   Calcium 9.0 8.9 - 10.3 mg/dL   Total Protein 4.9 (L) 6.5 - 8.1 g/dL   Albumin 2.4 (L) 3.5 - 5.0 g/dL   AST 96 (H) 15 - 41 U/L   ALT 46 (H) 0 - 44 U/L   Alkaline Phosphatase 339 (H) 38 - 126 U/L   Total Bilirubin 4.2 (H) 0.3 - 1.2 mg/dL   GFR, Estimated >60 >60 mL/min   Anion gap 6 5 - 15  Magnesium     Status: None   Collection Time: 11/17/21  4:32 AM  Result Value Ref Range   Magnesium 2.2 1.7 - 2.4 mg/dL  Glucose, capillary     Status: Abnormal   Collection Time: 11/17/21  7:33 AM  Result Value Ref Range   Glucose-Capillary 136 (H) 70 - 99 mg/dL  Glucose, capillary     Status: Abnormal   Collection Time: 11/17/21 11:52 AM  Result Value Ref Range   Glucose-Capillary 140 (H) 70 - 99 mg/dL  Glucose, capillary     Status: Abnormal   Collection Time: 11/17/21  4:23 PM  Result Value Ref Range   Glucose-Capillary 145 (H) 70 - 99 mg/dL   *Note: Due to a large number of results and/or encounters for the requested time period, some results have not been displayed. A complete set of results can be found in Results Review.    I have Reviewed nursing notes, Vitals, and Lab results since pt's last encounter. Pertinent lab results : see above I have ordered test including BMP,  CBC, Mg I have reviewed the last note from staff over past 24 hours I have discussed pt's care plan and test results with nursing staff, case manager   LOS: 2 days   Dwyane Dee, MD [facility] 11/17/2021, 4:50 PM

## 2021-11-17 NOTE — Assessment & Plan Note (Signed)
-   suspect some mild obstruction transiently due to underlying malignancy; possibly some hepatotoxicity but pattern looks cholestatic, but is improving  - will need LFT monitoring while on fluconazole as well

## 2021-11-17 NOTE — Progress Notes (Signed)
Pt is in NPO and pt and family member refused to take EGD on 11/17/2021, because pt have good appetite now and want to eat. RN educated the pt and family, they still refused to maintain NPO and refused the EGD.

## 2021-11-18 DIAGNOSIS — C221 Intrahepatic bile duct carcinoma: Secondary | ICD-10-CM | POA: Diagnosis not present

## 2021-11-18 DIAGNOSIS — N179 Acute kidney failure, unspecified: Secondary | ICD-10-CM | POA: Diagnosis not present

## 2021-11-18 DIAGNOSIS — B3781 Candidal esophagitis: Secondary | ICD-10-CM | POA: Diagnosis not present

## 2021-11-18 DIAGNOSIS — R1310 Dysphagia, unspecified: Secondary | ICD-10-CM | POA: Diagnosis not present

## 2021-11-18 LAB — CBC WITH DIFFERENTIAL/PLATELET
Abs Immature Granulocytes: 0.68 10*3/uL — ABNORMAL HIGH (ref 0.00–0.07)
Basophils Absolute: 0.1 10*3/uL (ref 0.0–0.1)
Basophils Relative: 1 %
Eosinophils Absolute: 0.1 10*3/uL (ref 0.0–0.5)
Eosinophils Relative: 1 %
HCT: 25.2 % — ABNORMAL LOW (ref 39.0–52.0)
Hemoglobin: 8.9 g/dL — ABNORMAL LOW (ref 13.0–17.0)
Immature Granulocytes: 12 %
Lymphocytes Relative: 16 %
Lymphs Abs: 0.9 10*3/uL (ref 0.7–4.0)
MCH: 34 pg (ref 26.0–34.0)
MCHC: 35.3 g/dL (ref 30.0–36.0)
MCV: 96.2 fL (ref 80.0–100.0)
Monocytes Absolute: 1.1 10*3/uL — ABNORMAL HIGH (ref 0.1–1.0)
Monocytes Relative: 19 %
Neutro Abs: 3 10*3/uL (ref 1.7–7.7)
Neutrophils Relative %: 51 %
Platelets: 121 10*3/uL — ABNORMAL LOW (ref 150–400)
RBC: 2.62 MIL/uL — ABNORMAL LOW (ref 4.22–5.81)
RDW: 16.9 % — ABNORMAL HIGH (ref 11.5–15.5)
WBC: 5.8 10*3/uL (ref 4.0–10.5)
nRBC: 0 % (ref 0.0–0.2)

## 2021-11-18 LAB — COMPREHENSIVE METABOLIC PANEL
ALT: 39 U/L (ref 0–44)
AST: 93 U/L — ABNORMAL HIGH (ref 15–41)
Albumin: 2.9 g/dL — ABNORMAL LOW (ref 3.5–5.0)
Alkaline Phosphatase: 288 U/L — ABNORMAL HIGH (ref 38–126)
Anion gap: 4 — ABNORMAL LOW (ref 5–15)
BUN: 39 mg/dL — ABNORMAL HIGH (ref 6–20)
CO2: 25 mmol/L (ref 22–32)
Calcium: 8.7 mg/dL — ABNORMAL LOW (ref 8.9–10.3)
Chloride: 98 mmol/L (ref 98–111)
Creatinine, Ser: 0.73 mg/dL (ref 0.61–1.24)
GFR, Estimated: >60 mL/min (ref >60)
Glucose, Bld: 111 mg/dL — ABNORMAL HIGH (ref 70–99)
Potassium: 3.9 mmol/L (ref 3.5–5.1)
Sodium: 127 mmol/L — ABNORMAL LOW (ref 135–145)
Total Bilirubin: 4.2 mg/dL — ABNORMAL HIGH (ref 0.3–1.2)
Total Protein: 4.7 g/dL — ABNORMAL LOW (ref 6.5–8.1)

## 2021-11-18 LAB — GLUCOSE, CAPILLARY
Glucose-Capillary: 102 mg/dL — ABNORMAL HIGH (ref 70–99)
Glucose-Capillary: 119 mg/dL — ABNORMAL HIGH (ref 70–99)
Glucose-Capillary: 147 mg/dL — ABNORMAL HIGH (ref 70–99)
Glucose-Capillary: 171 mg/dL — ABNORMAL HIGH (ref 70–99)
Glucose-Capillary: 161 mg/dL — ABNORMAL HIGH (ref 70–99)

## 2021-11-18 LAB — MAGNESIUM: Magnesium: 2.2 mg/dL (ref 1.7–2.4)

## 2021-11-18 MED ORDER — WITCH HAZEL-GLYCERIN EX PADS
MEDICATED_PAD | CUTANEOUS | Status: DC | PRN
  Administered 2021-11-18: 1 via TOPICAL
  Filled 2021-11-18: qty 100

## 2021-11-18 MED ORDER — BISACODYL 10 MG RE SUPP
10.0000 mg | Freq: Once | RECTAL | Status: AC
  Administered 2021-11-18: 10 mg via RECTAL
  Filled 2021-11-18: qty 1

## 2021-11-18 MED ORDER — FLUCONAZOLE 100 MG PO TABS
200.0000 mg | ORAL_TABLET | Freq: Every day | ORAL | Status: DC
  Administered 2021-11-18: 200 mg via ORAL
  Filled 2021-11-18: qty 2

## 2021-11-18 MED ORDER — BISACODYL 10 MG RE SUPP
10.0000 mg | Freq: Every day | RECTAL | Status: DC | PRN
  Administered 2021-11-18: 10 mg via RECTAL
  Filled 2021-11-18: qty 1

## 2021-11-18 MED ORDER — GLYCERIN (LAXATIVE) 2 G RE SUPP: 1.0000 | Freq: Every day | RECTAL | Status: DC | PRN

## 2021-11-18 NOTE — Progress Notes (Signed)
Progress Note    Matthew Osborne   KWI:[id number]  DOB: 11-18-1975  DOA: 11/15/2021     3 PCP: Susy Frizzle, MD  Initial CC: cough, pain with swallowing   Hospital Course: Matthew Osborne is a 46 yo male with PMH cholangiocarcinoma (mets to liver), recurrent abdominal ascites (s/p peritoneal PleurX on 10/31/21), hyperCa, anxiety, DMII, HLD, HTN who presented with multiple complaints including thick frothy productive cough and ongoing increasing difficulty with swallowing.  He was describing painful swallowing as well as newer onset indigestion.  He has been taking Magic mouthwash with lidocaine which is the only thing he says that helps typically for his recent complaints of odynophagia. He was requiring consistent paracentesis prior to placement of Pleurx catheter.  He is not on hospice level care and Pleurx catheter placement was pursued mostly by patient and wife request.  She has been draining approximately 1 L daily since placement but some days less than 1 L.  On work-up in the ER, patient was noted to have significant thrush with further concern for possible esophageal candidiasis.  Case was briefly discussed with ID and due to hepatic metastasis/abnormal LFTs, patient was placed on micafungin and admitted for further work-up and GI evaluation.  With further work-up, he also had multiple metabolic derangements.  Interval History:  No events overnight. Pain with swallowing has improved some. They declined EGD today.  Patient sleeping comfortably in bed, didn't feel like talking much.   Assessment and Plan: * Esophageal candidiasis ([place]) - During work-up in the ER, patient was noted to have thrush with associated odynophagia raising concern for esophageal candidiasis -Evaluated by ID as well.  Patient considered okay for using fluconazole with routine monitoring of LFTs; transitioned to fluconazole on 11/18/2021 -Given some improvement in odynophagia, diagnosis will be considered  esophageal candidiasis and given his underlying immunocompromise state/malignancy, treatment course would likely be 28 days -Patient and wife are declining EGD at this time as well  Odynophagia - See thrush/candidiasis work-up as well -Patient states MMW with lidocaine has helped, continue this -Add on Maalox and Bentyl as patient also describing "burning pain" -Protonix IV daily; continue Carafate  Neutropenia ([place]) - likely due to malignancy/chemo use  AKI (acute kidney injury) ([place]) - baseline creatinine ~ 0.8 - 1 - patient presents with increase in creat >0.3 mg/dL above baseline, creat increase >1.5x baseline presumed to have occurred within past 7 days PTA - etiology suspected due to pre-renal from poor PO intake; also contribution from hypoperfusion with hypoalbuminemia in setting of ongoing PleurX cath use/drainage which will increase risk of ATN (I have emphasized to wife/patient that PleurX is beneficial moreso in a setting of hospice and may be making things worse now currently) - continue IVF; add on albumin to help with 3rd spacing - check FeNa: <1%, consistent with prerenal  - renal u/s negative for obstruction or hydro   Hyponatremia - multifactorial etiology (malignancy, hypovolemia from poor intake and PleurX losses) - baseline around ~128-130 - Na 119 on admission - continue IVF - trend BMP  Cholangiocarcinoma metastatic to liver [facility] 2) - follows with Dr. Benay Spice - has been recently on FOLFIRI - per last CT A/P on 09/06/21: " Continued progression of liver metastases.  Necrotic metastatic lymphadenopathy in the periportal region shows no substantial change." - overall, it appears prognosis continues to remain guarded at best and likely poor. Now with peritoneal PleurX cath he's likely to start causing further metabolic derangements causing further complications; I've reinforced with patient  and wife that PleurX is typically used in context of hospice/end-of-life care  settings  Hyperkalemia-resolved as of 11/17/2021 - etiology likely AKI - K 6.2 on admission, s/p treatment -Responded well to treatment  Elevated LFTs - suspect some mild obstruction transiently due to underlying malignancy; possibly some hepatotoxicity but pattern looks cholestatic, but is improving  - will need LFT monitoring while on fluconazole as well   Malignant ascites - due to cholangiocarcinoma with liver mets -Patient was dependent on recurrent paracentesis and despite recommendations to avoid unless hospice context, patient and wife were insistent on peritoneal Pleurx catheter placement (performed on 10/31/21) - has had some issues with leaking around cath site lately; s/p pursestring suture placement on 10/3 -Radiology recommending drainage of Pleurx routinely to avoid further risk of leakage; and this speaks to indication for catheter placement especially in setting of typically hospice level patient's -Ongoing GOC discussions will need to continue especially if leaking persists  Hypercalcemia - likely multifactorial in setting of malignancy but also hypovolemia - improved with IVF  DMII (diabetes mellitus, type 2) ([place]) - last A1c 5.7% on 11/15/21 -Insulin pump settings reviewed -Okay for patient to use insulin pump as long as no issues with glucose levels  Anxiety - continue ativan    Old records reviewed in assessment of this patient  Antimicrobials: Micafungin 11/15/2021 >> current   DVT prophylaxis:  SCDs Start: 11/15/21 1757   Code Status:   Code Status: Full Code  Mobility Assessment (last 72 hours)     Mobility Assessment     Row Name 11/18/21 1106 11/17/21 2040 11/17/21 1007 11/16/21 2115 11/16/21 0900   Does patient have an order for bedrest or is patient medically unstable No - Continue assessment No - Continue assessment No - Continue assessment No - Continue assessment No - Continue assessment   What is the highest level of mobility based on  the progressive mobility assessment? Level 3 (Stands with assist) - Balance while standing  and cannot march in place Level 3 (Stands with assist) - Balance while standing  and cannot march in place Level 3 (Stands with assist) - Balance while standing  and cannot march in place Level 3 (Stands with assist) - Balance while standing  and cannot march in place Level 3 (Stands with assist) - Balance while standing  and cannot march in place   Is the above level different from baseline mobility prior to current illness? Yes - Recommend PT order Yes - Recommend PT order Yes - Recommend PT order Yes - Recommend PT order Yes - Recommend PT order    [place] Name 11/15/21 2031 11/15/21 1845         Does patient have an order for bedrest or is patient medically unstable No - Continue assessment No - Continue assessment      What is the highest level of mobility based on the progressive mobility assessment? Level 3 (Stands with assist) - Balance while standing  and cannot march in place Level 3 (Stands with assist) - Balance while standing  and cannot march in place      Is the above level different from baseline mobility prior to current illness? Yes - Recommend PT order No - Consider discontinuing PT/OT               Barriers to discharge: none Disposition Plan:  Home Status is: Inpt  Objective: Blood pressure 117/67, pulse 80, temperature 98.4 F (36.9 C), temperature source Oral, resp. rate 20, height 6'  2" (1.88 m), weight 83.9 kg, SpO2 97 %.  Examination:  Physical Exam Constitutional:      Comments: Chronically ill-appearing adult man laying in bed; today more uncomfortable and complaining of rectal pain  HENT:     Head: Normocephalic and atraumatic.     Mouth/Throat:     Mouth: Mucous membranes are moist.     Comments: Patient unable to open mouth much but no obvious thrush noted on tongue Eyes:     Extraocular Movements: Extraocular movements intact.  Cardiovascular:     Rate and  Rhythm: Normal rate and regular rhythm.     Heart sounds: Normal heart sounds.  Pulmonary:     Effort: Pulmonary effort is normal. No respiratory distress.     Breath sounds: Normal breath sounds. No wheezing.  Abdominal:     General: Bowel sounds are normal. There is no distension.     Palpations: Abdomen is soft.     Tenderness: There is no abdominal tenderness.     Comments: Abdominal Pleurx catheter noted  Musculoskeletal:        General: Normal range of motion.     Cervical back: Normal range of motion and neck supple.  Lymphadenopathy:     Cervical: No cervical adenopathy.  Skin:    General: Skin is warm and dry.  Neurological:     General: No focal deficit present.  Psychiatric:        Mood and Affect: Mood normal.        Behavior: Behavior normal.      Consultants:  GI  Procedures:    Data Reviewed: Results for orders placed or performed during the hospital encounter of 11/15/21 (from the past 24 hour(s))  Glucose, capillary     Status: Abnormal   Collection Time: 11/17/21  4:23 PM  Result Value Ref Range   Glucose-Capillary 145 (H) 70 - 99 mg/dL  Glucose, capillary     Status: Abnormal   Collection Time: 11/17/21  8:12 PM  Result Value Ref Range   Glucose-Capillary 142 (H) 70 - 99 mg/dL  Glucose, capillary     Status: Abnormal   Collection Time: 11/18/21  3:04 AM  Result Value Ref Range   Glucose-Capillary 102 (H) 70 - 99 mg/dL  CBC with Differential/Platelet     Status: Abnormal   Collection Time: 11/18/21  5:09 AM  Result Value Ref Range   WBC 5.8 4.0 - 10.5 K/uL   RBC 2.62 (L) 4.22 - 5.81 MIL/uL   Hemoglobin 8.9 (L) 13.0 - 17.0 g/dL   HCT 25.2 (L) 39.0 - 52.0 %   MCV 96.2 80.0 - 100.0 fL   MCH 34.0 26.0 - 34.0 pg   MCHC 35.3 30.0 - 36.0 g/dL   RDW 16.9 (H) 11.5 - 15.5 %   Platelets 121 (L) 150 - 400 K/uL   nRBC 0.0 0.0 - 0.2 %   Neutrophils Relative % 51 %   Neutro Abs 3.0 1.7 - 7.7 K/uL   Lymphocytes Relative 16 %   Lymphs Abs 0.9 0.7 - 4.0  K/uL   Monocytes Relative 19 %   Monocytes Absolute 1.1 (H) 0.1 - 1.0 K/uL   Eosinophils Relative 1 %   Eosinophils Absolute 0.1 0.0 - 0.5 K/uL   Basophils Relative 1 %   Basophils Absolute 0.1 0.0 - 0.1 K/uL   WBC Morphology      MODERATE LEFT SHIFT (>5% METAS AND MYELOS,OCC PRO NOTED)   Immature Granulocytes 12 %   Abs  Immature Granulocytes 0.68 (H) 0.00 - 0.07 K/uL  Comprehensive metabolic panel     Status: Abnormal   Collection Time: 11/18/21  5:09 AM  Result Value Ref Range   Sodium 127 (L) 135 - 145 mmol/L   Potassium 3.9 3.5 - 5.1 mmol/L   Chloride 98 98 - 111 mmol/L   CO2 25 22 - 32 mmol/L   Glucose, Bld 111 (H) 70 - 99 mg/dL   BUN 39 (H) 6 - 20 mg/dL   Creatinine, Ser 0.73 0.61 - 1.24 mg/dL   Calcium 8.7 (L) 8.9 - 10.3 mg/dL   Total Protein 4.7 (L) 6.5 - 8.1 g/dL   Albumin 2.9 (L) 3.5 - 5.0 g/dL   AST 93 (H) 15 - 41 U/L   ALT 39 0 - 44 U/L   Alkaline Phosphatase 288 (H) 38 - 126 U/L   Total Bilirubin 4.2 (H) 0.3 - 1.2 mg/dL   GFR, Estimated >60 >60 mL/min   Anion gap 4 (L) 5 - 15  Magnesium     Status: None   Collection Time: 11/18/21  5:09 AM  Result Value Ref Range   Magnesium 2.2 1.7 - 2.4 mg/dL  Glucose, capillary     Status: Abnormal   Collection Time: 11/18/21  8:06 AM  Result Value Ref Range   Glucose-Capillary 119 (H) 70 - 99 mg/dL  Glucose, capillary     Status: Abnormal   Collection Time: 11/18/21 11:48 AM  Result Value Ref Range   Glucose-Capillary 147 (H) 70 - 99 mg/dL   *Note: Due to a large number of results and/or encounters for the requested time period, some results have not been displayed. A complete set of results can be found in Results Review.    I have Reviewed nursing notes, Vitals, and Lab results since pt's last encounter. Pertinent lab results : see above I have ordered test including BMP, CBC, Mg I have reviewed the last note from staff over past 24 hours I have discussed pt's care plan and test results with nursing staff, case  manager   LOS: 3 days   Dwyane Dee, MD [facility] 11/18/2021, 4:08 PM

## 2021-11-19 ENCOUNTER — Encounter: Payer: Self-pay | Admitting: Oncology

## 2021-11-19 ENCOUNTER — Other Ambulatory Visit (HOSPITAL_COMMUNITY): Payer: Self-pay

## 2021-11-19 ENCOUNTER — Inpatient Hospital Stay: Payer: No Typology Code available for payment source

## 2021-11-19 ENCOUNTER — Other Ambulatory Visit (HOSPITAL_BASED_OUTPATIENT_CLINIC_OR_DEPARTMENT_OTHER): Payer: Self-pay

## 2021-11-19 ENCOUNTER — Inpatient Hospital Stay: Payer: No Typology Code available for payment source | Admitting: Oncology

## 2021-11-19 DIAGNOSIS — R7989 Other specified abnormal findings of blood chemistry: Secondary | ICD-10-CM

## 2021-11-19 DIAGNOSIS — R799 Abnormal finding of blood chemistry, unspecified: Secondary | ICD-10-CM

## 2021-11-19 LAB — CBC WITH DIFFERENTIAL/PLATELET
Abs Immature Granulocytes: 0.7 10*3/uL — ABNORMAL HIGH (ref 0.00–0.07)
Band Neutrophils: 6 %
Basophils Absolute: 0 10*3/uL (ref 0.0–0.1)
Basophils Relative: 0 %
Eosinophils Absolute: 0.2 10*3/uL (ref 0.0–0.5)
Eosinophils Relative: 2 %
HCT: 29.1 % — ABNORMAL LOW (ref 39.0–52.0)
Hemoglobin: 10 g/dL — ABNORMAL LOW (ref 13.0–17.0)
Lymphocytes Relative: 8 %
Lymphs Abs: 0.7 10*3/uL (ref 0.7–4.0)
MCH: 33.8 pg (ref 26.0–34.0)
MCHC: 34.4 g/dL (ref 30.0–36.0)
MCV: 98.3 fL (ref 80.0–100.0)
Metamyelocytes Relative: 1 %
Monocytes Absolute: 0.9 10*3/uL (ref 0.1–1.0)
Monocytes Relative: 10 %
Myelocytes: 5 %
Neutro Abs: 6.8 10*3/uL (ref 1.7–7.7)
Neutrophils Relative %: 67 %
Platelets: 124 10*3/uL — ABNORMAL LOW (ref 150–400)
Promyelocytes Relative: 1 %
RBC: 2.96 MIL/uL — ABNORMAL LOW (ref 4.22–5.81)
RDW: 17.5 % — ABNORMAL HIGH (ref 11.5–15.5)
WBC: 9.3 10*3/uL (ref 4.0–10.5)
nRBC: 0 % (ref 0.0–0.2)

## 2021-11-19 LAB — COMPREHENSIVE METABOLIC PANEL
ALT: 43 U/L (ref 0–44)
AST: 112 U/L — ABNORMAL HIGH (ref 15–41)
Albumin: 2.9 g/dL — ABNORMAL LOW (ref 3.5–5.0)
Alkaline Phosphatase: 356 U/L — ABNORMAL HIGH (ref 38–126)
Anion gap: 4 — ABNORMAL LOW (ref 5–15)
BUN: 30 mg/dL — ABNORMAL HIGH (ref 6–20)
CO2: 25 mmol/L (ref 22–32)
Calcium: 8.8 mg/dL — ABNORMAL LOW (ref 8.9–10.3)
Chloride: 99 mmol/L (ref 98–111)
Creatinine, Ser: 0.77 mg/dL (ref 0.61–1.24)
GFR, Estimated: >60 mL/min (ref >60)
Glucose, Bld: 101 mg/dL — ABNORMAL HIGH (ref 70–99)
Potassium: 4.6 mmol/L (ref 3.5–5.1)
Sodium: 128 mmol/L — ABNORMAL LOW (ref 135–145)
Total Bilirubin: 5 mg/dL — ABNORMAL HIGH (ref 0.3–1.2)
Total Protein: 5 g/dL — ABNORMAL LOW (ref 6.5–8.1)

## 2021-11-19 LAB — GLUCOSE, CAPILLARY
Glucose-Capillary: 149 mg/dL — ABNORMAL HIGH (ref 70–99)
Glucose-Capillary: 126 mg/dL — ABNORMAL HIGH (ref 70–99)

## 2021-11-19 LAB — MAGNESIUM: Magnesium: 2.2 mg/dL (ref 1.7–2.4)

## 2021-11-19 MED ORDER — NYSTATIN 100000 UNIT/ML MT SUSP
10.0000 mL | OROMUCOSAL | 1 refills | Status: AC | PRN
  Filled 2021-11-19: qty 500, 9d supply, fill #0
  Filled 2021-12-14: qty 500, 9d supply, fill #1

## 2021-11-19 MED ORDER — SUCRALFATE 1 G PO TABS
1.0000 g | ORAL_TABLET | Freq: Three times a day (TID) | ORAL | 1 refills | Status: AC
  Filled 2021-11-19: qty 40, 10d supply, fill #0
  Filled 2021-12-14: qty 40, 10d supply, fill #1

## 2021-11-19 MED ORDER — OXYCODONE HCL 5 MG PO TABS
5.0000 mg | ORAL_TABLET | Freq: Four times a day (QID) | ORAL | 0 refills | Status: DC | PRN
  Filled 2021-11-19: qty 10, 3d supply, fill #0

## 2021-11-19 MED ORDER — FLUCONAZOLE 200 MG PO TABS
400.0000 mg | ORAL_TABLET | Freq: Every day | ORAL | 0 refills | Status: AC
  Filled 2021-11-19: qty 12, 6d supply, fill #0
  Filled 2021-11-20: qty 34, 17d supply, fill #0

## 2021-11-19 MED ORDER — DICYCLOMINE HCL 10 MG/5ML PO SOLN
10.0000 mg | Freq: Three times a day (TID) | ORAL | 2 refills | Status: AC | PRN
  Filled 2021-11-19: qty 473, 16d supply, fill #0
  Filled 2021-12-14: qty 473, 16d supply, fill #1

## 2021-11-19 MED ORDER — FLUCONAZOLE 100 MG PO TABS
400.0000 mg | ORAL_TABLET | Freq: Every day | ORAL | Status: DC
  Administered 2021-11-19: 400 mg via ORAL
  Filled 2021-11-19: qty 4

## 2021-11-19 MED ORDER — PANTOPRAZOLE SODIUM 40 MG PO TBEC
40.0000 mg | DELAYED_RELEASE_TABLET | Freq: Every day | ORAL | 3 refills | Status: AC
  Filled 2021-11-19: qty 30, 30d supply, fill #0

## 2021-11-19 NOTE — Assessment & Plan Note (Signed)
-   1/2 bottles from 10/12 noted with staph epi, suspected contamination - repeated blood cultures on 10/14; remain negative at 48 hrs

## 2021-11-19 NOTE — Progress Notes (Signed)
Resumed care of patient from [place], [place]. Patient resting in bed with wife at bedside, no complaints at this time. Awaiting walker to be delivered for discharge home. Will continue to monitor patient.

## 2021-11-19 NOTE — TOC Transition Note (Signed)
Transition of Care [facility]) - CM/SW Discharge Note   Patient Details  Name: Matthew Osborne MRN: [id number] Date of Birth: 1975-10-24  Transition of Care [facility]) CM/SW Contact:  Dessa Phi, RN Phone Number: 11/19/2021, 12:33 PM   Clinical Narrative:  Gilda Crease ordered-spoke to spouse about d/c plans-she provides the pleurx cath drain care-she orders the cath rom [facility]-informed her to submit the bill to insurance when she orders additional supplies EX:[id number] may or may not cover the cost.RW to be delivered to rm by [facility]. Has own transport. No further CM needs.     Final next level of care: Home/Self Care Barriers to Discharge: No Barriers Identified   Patient Goals and CMS Choice   CMS Medicare.gov Compare Post Acute Care list provided to:: Patient Represenative (must comment) Choice offered to / list presented to : Spouse  Discharge Placement                       Discharge Plan and Services   Discharge Planning Services: CM Consult Post Acute Care Choice: Durable Medical Equipment          DME Arranged: Walker rolling DME Agency: [facility] Date DME Agency Contacted: 11/19/21 Time DME Agency Contacted: 86 Representative spoke with at DME Agency: [place] Interventions     Readmission Risk Interventions     No data to display

## 2021-11-19 NOTE — Evaluation (Signed)
Physical Therapy Evaluation Patient Details Name: Matthew Osborne MRN: [id number] DOB: 1975/12/08 Today's Date: 11/19/2021  History of Present Illness  46 yo male with PMH cholangiocarcinoma (mets to liver), recurrent abdominal ascites (s/p peritoneal PleurX on 10/31/21), hyperCa, anxiety, DMII, HLD, HTN who presented with multiple complaints including thick frothy productive cough and ongoing increasing difficulty with swallowing.  Clinical Impression  Min assist for supine to sit, min/guard for sit to stand, pt ambulated 140' with RW, no loss of balance. Pt/wife report h/o ~4 falls in past 6 months, he'd been ambulating without an assistive device.   Order placed for RW. Pt plans to DC home today.     Recommendations for follow up therapy are one component of a multi-disciplinary discharge planning process, led by the attending physician.  Recommendations may be updated based on patient status, additional functional criteria and insurance authorization.  Follow Up Recommendations No PT follow up      Assistance Recommended at Discharge Intermittent Supervision/Assistance  Patient can return home with the following  A little help with walking and/or transfers;A little help with bathing/dressing/bathroom;Assistance with cooking/housework;Assist for transportation;Help with stairs or ramp for entrance    Equipment Recommendations Rolling walker (2 wheels)  Recommendations for Other Services       Functional Status Assessment Patient has had a recent decline in their functional status and demonstrates the ability to make significant improvements in function in a reasonable and predictable amount of time.     Precautions / Restrictions Precautions Precautions: Fall Precaution Comments: wife reports h/o ~4 falls in past 6 months Restrictions Weight Bearing Restrictions: No      Mobility  Bed Mobility Overal bed mobility: Needs Assistance Bed Mobility: Supine to Sit     Supine to  sit: Min assist     General bed mobility comments: assist to raise trunk    Transfers Overall transfer level: Needs assistance Equipment used: Rolling walker (2 wheels) Transfers: Sit to/from Stand Sit to Stand: Min guard, From elevated surface           General transfer comment: VCs hand placement, min/guard for safety    Ambulation/Gait Ambulation/Gait assistance: Min guard Gait Distance (Feet): 140 Feet Assistive device: Rolling walker (2 wheels) Gait Pattern/deviations: Step-through pattern, Decreased stride length Gait velocity: decr     General Gait Details: min/guard due to h/o recent falls, pt had no loss of balance, stated RW is helpful, order placed for RW and secure chat message sent to [facility]  Stairs            Wheelchair Mobility    Modified Rankin (Stroke Patients Only)       Balance Overall balance assessment: Modified Independent, History of Falls                                           Pertinent Vitals/Pain Pain Assessment Pain Assessment: No/denies pain    Home Living Family/patient expects to be discharged to:: Private residence Living Arrangements: Spouse/significant other Available Help at Discharge: Family;Available 24 hours/day   Home Access: Stairs to enter Entrance Stairs-Rails: Chemical engineer of Steps: 3   Home Layout: Able to live on main level with bedroom/bathroom Home Equipment: None      Prior Function Prior Level of Function : Needs assist             Mobility Comments: has been unsteady lately  and has gotten worse recently 2* chemo, ~4 falls/6 months ADLs Comments: just started needing help in the past 1 week     Hand Dominance        Extremity/Trunk Assessment   Upper Extremity Assessment Upper Extremity Assessment: Overall WFL for tasks assessed    Lower Extremity Assessment Lower Extremity Assessment: Overall WFL for tasks assessed; reports h/o peripheral  neuropathy    Cervical / Trunk Assessment Cervical / Trunk Assessment: Normal  Communication   Communication: No difficulties  Cognition Arousal/Alertness: Awake/alert Behavior During Therapy: Flat affect Overall Cognitive Status: Within Functional Limits for tasks assessed                                          General Comments      Exercises     Assessment/Plan    PT Assessment Patient does not need any further PT services  PT Problem List         PT Treatment Interventions      PT Goals (Current goals can be found in the Care Plan section)  Acute Rehab PT Goals Patient Stated Goal: reduce falls PT Goal Formulation: All assessment and education complete, DC therapy    Frequency       Co-evaluation               AM-PAC PT "6 Clicks" Mobility  Outcome Measure Help needed turning from your back to your side while in a flat bed without using bedrails?: A Little Help needed moving from lying on your back to sitting on the side of a flat bed without using bedrails?: A Little Help needed moving to and from a bed to a chair (including a wheelchair)?: A Little Help needed standing up from a chair using your arms (e.g., wheelchair or bedside chair)?: A Little Help needed to walk in hospital room?: A Little Help needed climbing 3-5 steps with a railing? : A Little 6 Click Score: 18    End of Session Equipment Utilized During Treatment: Gait belt Activity Tolerance: Patient limited by fatigue Patient left: in bed;with call bell/phone within reach;with family/visitor present Nurse Communication: Mobility status PT Visit Diagnosis: Unsteadiness on feet (R26.81);History of falling (Z91.81)    Time: 1202-1217 PT Time Calculation (min) (ACUTE ONLY): 15 min   Charges:   PT Evaluation $PT Eval Moderate Complexity: 1 Mod          Philomena Doheny PT 11/19/2021  Acute Rehabilitation Services  Office [phone]

## 2021-11-19 NOTE — TOC Progression Note (Signed)
Transition of Care [facility]) - Progression Note    Patient Details  Name: Matthew Osborne MRN: [id number] Date of Birth: [birth year]  Transition of Care [facility]) CM/SW Contact  Rebbie Lauricella, Juliann Pulse, RN Phone Number: 11/19/2021, 10:49 AM  Clinical Narrative: Noted already uses [facility] for Pleurx cath drain supplies for QD drainage has supplies @ home. Will await PT recc.      Expected Discharge Plan: Home/Self Care Barriers to Discharge: Continued Medical Work up  Expected Discharge Plan and Services Expected Discharge Plan: Home/Self Care                                               Social Determinants of Health (SDOH) Interventions    Readmission Risk Interventions     No data to display

## 2021-11-19 NOTE — Progress Notes (Signed)
PHARMACY NOTE:  ANTIMICROBIAL RENAL DOSAGE ADJUSTMENT  Current antimicrobial regimen includes a mismatch between antimicrobial dosage and estimated renal function.  As per policy approved by the Pharmacy & Therapeutics and Medical Executive Committees, the antimicrobial dosage will be adjusted accordingly.  Current antimicrobial dosage:  Fluconazole 200 mg daily   Indication: Esophageal candidiasis   Renal Function:  Estimated Creatinine Clearance: 134.1 mL/min (by C-G formula based on SCr of 0.77 mg/dL). '[]'$      On intermittent HD, scheduled: '[]'$      On CRRT    Antimicrobial dosage has been changed to:  Fluconazole 400 mg daily   Additional comments:Increased dose for optimal treatment of esophageal candida    Thank you for allowing pharmacy to be a part of this patient's care.  Jimmy Footman, PharmD, BCPS, BCIDP Infectious Diseases Clinical Pharmacist Phone: [phone] 11/19/2021 7:59 AM

## 2021-11-19 NOTE — Discharge Summary (Signed)
Physician Discharge Summary   GAY RAPE YDX:[id number] DOB: 11/26/1975 DOA: 11/15/2021  PCP: Susy Frizzle, MD  Admit date: 11/15/2021 Discharge date: 11/19/2021  Barriers to discharge: none  Admitted From: Home Disposition:  Home Discharging physician: Dwyane Dee, MD  Recommendations for Outpatient Follow-up:  Follow up with oncology as scheduled Repeat LFTs weekly while on fluconazole  Consider further palliative care discussions if any further decline If recurrent odynophagia, may need formal EGD  Home Health:  Equipment/Devices:   Discharge Condition: stable CODE STATUS: Full Diet recommendation:  Diet Orders (From admission, onward)     Start     Ordered   11/19/21 0851  Diet regular Room service appropriate? Yes; Fluid consistency: Thin  Diet effective now       Question Answer Comment  Room service appropriate? Yes   Fluid consistency: Thin      11/19/21 0851   11/19/21 0000  Diet general        11/19/21 1208            Hospital Course: Mr. Dreisbach is a 46 yo male with PMH cholangiocarcinoma (mets to liver), recurrent abdominal ascites (s/p peritoneal PleurX on 10/31/21), hyperCa, anxiety, DMII, HLD, HTN who presented with multiple complaints including thick frothy productive cough and ongoing increasing difficulty with swallowing.  He was describing painful swallowing as well as newer onset indigestion.  He has been taking Magic mouthwash with lidocaine which is the only thing he says that helps typically for his recent complaints of odynophagia. He was requiring consistent paracentesis prior to placement of Pleurx catheter.  He is not on hospice level care and Pleurx catheter placement was ultimately pursued due to ongoing paracentesis requirement. She has been draining approximately 1 L daily since placement but some days less than 1 L.  On work-up in the ER, patient was noted to have significant thrush with further concern for possible esophageal  candidiasis.  Case was briefly discussed with ID and due to hepatic metastasis/abnormal LFTs, patient was placed on micafungin and admitted for further work-up and GI evaluation. He was considered appropriate for de-escalating to fluconazole to continue course at discharge with monitoring of LFTs.  With further work-up, he also had multiple metabolic derangements.  Many of these derangements improved with IV fluids.  See below for further A&P.  Assessment and Plan: * Esophageal candidiasis ([place]) - During work-up in the ER, patient was noted to have thrush with associated odynophagia raising concern for esophageal candidiasis -Evaluated by ID as well.  Patient considered okay for using fluconazole with routine monitoring of LFTs; transitioned to fluconazole on 11/18/2021 -Given some improvement in odynophagia, diagnosis will be considered esophageal candidiasis and given his underlying immunocompromise state/malignancy, treatment course would likely be 28 days -Patient and wife are declining EGD at this time as well -If patient does return with recurrent odynophagia, will likely need EGD for more direct evaluation  Odynophagia - See thrush/candidiasis work-up as well -Patient states MMW with lidocaine has helped, continue this -Add on Maalox and Bentyl as patient also describing "burning pain" -Protonix IV daily; continue Carafate -If patient does return with recurrent odynophagia, will likely need EGD for more direct evaluation  Neutropenia ([place]) - likely due to malignancy/chemo use  Hyponatremia - multifactorial etiology (malignancy, hypovolemia from poor intake and PleurX losses) - baseline around ~128-130 - Na 119 on admission -Responded well to fluid resuscitation. 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 Benay Spice - has been recently on FOLFIRI - per last CT A/P on 09/06/21: "  Continued progression of liver metastases.  Necrotic metastatic lymphadenopathy in the periportal region shows no substantial change." - overall, it appears prognosis continues to remain guarded at best and likely poor. Now with peritoneal PleurX cath he's likely to start causing further metabolic derangements causing further complications; I've reinforced that PleurX is typically used in context of hospice/end-of-life care settings  Hyperkalemia-resolved as of 11/17/2021 - etiology likely AKI - K 6.2 on admission, s/p treatment -Responded well to treatment -Spironolactone discontinued at discharge  AKI (acute kidney injury) (HCC)-resolved as of 11/19/2021 - baseline creatinine ~ 0.8 - 1 - patient presents with increase in creat >0.3 mg/dL above baseline, creat increase >1.5x baseline presumed to have occurred within past 7 days PTA - etiology suspected due to pre-renal from poor PO intake; also contribution from hypoperfusion with hypoalbuminemia in setting of ongoing PleurX cath use/drainage which will increase risk of ATN (I have emphasized to wife/patient that PleurX is beneficial moreso in a setting of hospice and may be making things worse now currently) - check FeNa: <1%, consistent with prerenal  - renal u/s negative for obstruction or hydro - s/p IVF and IV albumin with good response - BUN 30 and creat 0.77 at discharge  - discontinue aldactone due to hyperkalemia  Elevated LFTs - suspect some mild obstruction transiently due to underlying malignancy; possibly some hepatotoxicity but pattern looks cholestatic, but is improving  - will need LFT monitoring while on fluconazole as well   Malignant ascites - due to cholangiocarcinoma with liver mets -Patient was dependent on recurrent paracentesis; ultimately he underwent peritoneal Pleurx catheter placement (performed on 10/31/21). Patient aware this is only typically done in a hospice level context - has had some issues with leaking  around cath site lately; s/p pursestring suture placement on 10/3 -Radiology recommending drainage of Pleurx routinely to avoid further risk of leakage (pressure buildup); and this speaks to indication for catheter placement especially in setting of typically hospice level patients -Ongoing GOC discussions will need to continue especially if leaking persists  Hypercalcemia - likely multifactorial in setting of malignancy but also hypovolemia - improved with IVF - resolved  DMII (diabetes mellitus, type 2) ([place]) - last A1c 5.7% on 11/15/21 -Insulin pump settings reviewed -Okay for patient to use insulin pump as long as no issues with glucose levels  Anxiety - continue ativan   Contamination of blood culture-resolved as of 11/19/2021 - 1/2 bottles from 10/12 noted with staph epi, suspected contamination - repeated blood cultures on 10/14; remain negative at 48 hrs       The patient's chronic medical conditions were treated accordingly per the patient's home medication regimen except as noted.  On day of discharge, patient was felt deemed stable for discharge. Patient/family member advised to call PCP or come back to ER if needed.   Principal Diagnosis: Esophageal candidiasis [facility])  Discharge Diagnoses: Active Hospital Problems   Diagnosis Date Noted   Esophageal candidiasis ([place]) 11/15/2021    Priority: 1.   Odynophagia 11/16/2021    Priority: 2.   Hyponatremia 11/15/2021    Priority: 3.   Neutropenia ([place]) 11/15/2021    Priority: 3.   Cholangiocarcinoma metastatic to liver ([place]) 02/01/2020    Priority: 3.   Elevated LFTs 11/17/2021    Priority: 4.   Malignant ascites 11/15/2021    Priority: 4.   Hypercalcemia 01/14/2020  Priority: 4.   DMII (diabetes mellitus, type 2) ([place]) 06/30/2013    Priority: 4.   Protein-calorie malnutrition, severe 11/16/2021   Anxiety 11/15/2021    Resolved Hospital Problems   Diagnosis Date Noted Date Resolved   AKI (acute kidney  injury) ([place]) 11/15/2021 11/19/2021    Priority: 3.   Hyperkalemia 11/15/2021 11/17/2021    Priority: 3.   Contamination of blood culture 11/19/2021 11/19/2021     Discharge Instructions     Diet general   Complete by: As directed    Increase activity slowly   Complete by: As directed       Allergies as of 11/19/2021       Reactions   Niaspan [niacin] Other (See Comments)   Flushing        Medication List     STOP taking these medications    ALPRAZolam 0.5 MG tablet Commonly known as: Xanax   chlorpheniramine-HYDROcodone 10-8 MG/5ML Commonly known as: TUSSIONEX   spironolactone 25 MG tablet Commonly known as: Aldactone   traMADol 50 MG tablet Commonly known as: ULTRAM       TAKE these medications    Baqsimi Two Pack 3 MG/DOSE Powd Generic drug: Glucagon Use as directed nasally once as needed (as directed Nasally Once a day PRN 30 days)   blood glucose meter kit and supplies Kit Fasting prior to each meal three times a day   Dexcom G6 Sensor Misc Use as directed to check blood sugar 5-6 times daily.   Dexcom G6 Transmitter Misc USE AS DIRECTED TO CHECK BLOOD SUGAR 5-6 TIMES DAILY *needs office visit*   dicyclomine 10 MG/5ML solution Commonly known as: BENTYL Take 5 mLs (10 mg total) by mouth 3 (three) times daily as needed (abdominal pain). Notes to patient: 11/19/21, 3 times daily as needed   fluconazole 200 MG tablet Commonly known as: DIFLUCAN Take 2 tablets (400 mg total) by mouth daily for 23 days. Start taking on: November 20, 2021 Notes to patient: 11/20/21   FLUoxetine 20 MG capsule Commonly known as: PROZAC Take 1 capsule (20 mg total) by mouth daily. Notes to patient: 11/20/21   FreeStyle Lite w/Device Kit Use as directed.   glucose blood test strip Dispense based on patient and insurance preference.  Use twice daily as directed. (FOR ICD-10 E11.65)   HumaLOG 100 UNIT/ML injection Generic drug: insulin lispro Inject 200  units/day under the skin via pump Notes to patient: 11/20/21   Insulin Syringe 27G X 1/2" 0.5 ML Misc Use as directed to inject insulin SQ Q1D.   lidocaine-prilocaine cream Commonly known as: EMLA APPLY 1 APPLICATION TOPICALLY AS DIRECTED. APPLY TO PORT SITE 1-2 HOURS PRIOR TO STICK AND COVER WITH PLASTIC WRAP. Notes to patient: As needed for port access   loratadine 10 MG tablet Commonly known as: CLARITIN Take 10 mg by mouth as needed for allergies. Notes to patient: 11/19/21, daily as needed   LORazepam 0.5 MG tablet Commonly known as: ATIVAN Take 1 tablet (0.5 mg total) by mouth at bedtime as needed for anxiety. Notes to patient: 11/19/21, at bedtime as needed   magic mouthwash (nystatin, lidocaine, diphenhydrAMINE, alum & mag hydroxide) suspension Take 10 mLs by mouth every 4 (four) hours as needed for mouth pain. Notes to patient: 11/19/21, every 4 hours as needed   Omnipod 5 G6 Pod (Gen 5) Misc Use as directed every 48 hours Notes to patient: Resume at home schedule   oxyCODONE 5 MG immediate release tablet Commonly known as:  Oxy IR/ROXICODONE Take 1 tablet (5 mg total) by mouth every 6 (six) hours as needed for severe pain. What changed: how much to take Notes to patient: 11/19/21, every 6 hours as needed   pantoprazole 40 MG tablet Commonly known as: PROTONIX Take 1 tablet (40 mg total) by mouth daily.   prochlorperazine 10 MG tablet Commonly known as: COMPAZINE Take 1 tablet by mouth every 6 hours as needed for nausea. (TAKE 1 TABLET BY MOUTH EVERY 6 HOURS AS NEEDED FOR NAUSEA) Notes to patient: 11/19/21, every 6 hours as needed   SENOKOT LAXATIVE GUMMIES PO Take 2 tablets by mouth daily at 12 noon. Notes to patient: 11/20/21   sucralfate 1 g tablet Commonly known as: CARAFATE Take 1 tablet (1 g total) by mouth 4 (four) times daily -  with meals and at bedtime.   Tyler Aas FlexTouch 100 UNIT/ML FlexTouch Pen Generic drug: insulin degludec Inject 6 units  under the skin once daily. Notes to patient: 11/20/21   TRUEplus Lancets 33G Misc 1 each by Does not apply route 2 (two) times daily.   Lancets Thin Misc Check BS BID   Unifine Pentips 32G X 4 MM Misc Generic drug: Insulin Pen Needle Use as directed               Durable Medical Equipment  (From admission, onward)           Start     Ordered   11/19/21 1219  For home use only DME Walker rolling  Once       Question Answer Comment  Walker: With [place]   Patient needs a walker to treat with the following condition Difficulty in walking, not elsewhere classified      11/19/21 1218            Allergies  Allergen Reactions   Niaspan [Niacin] Other (See Comments)    Flushing     Consultations: ID GI  Procedures:   Discharge Exam: BP (!) 113/59 (BP Location: Right Arm)   Pulse 88   Temp 98.6 F (37 C) (Oral)   Resp 20   Ht $R'6\' 2"'Th$  (1.88 m)   Wt 83.9 kg   SpO2 98%   BMI 23.75 kg/m  Physical Exam Constitutional:      Comments: More comfortable appearing; sitting in chair. NAD  HENT:     Head: Normocephalic and atraumatic.     Mouth/Throat:     Mouth: Mucous membranes are moist.     Comments: No further thrush Eyes:     Extraocular Movements: Extraocular movements intact.  Cardiovascular:     Rate and Rhythm: Normal rate and regular rhythm.     Heart sounds: Normal heart sounds.  Pulmonary:     Effort: Pulmonary effort is normal. No respiratory distress.     Breath sounds: Normal breath sounds. No wheezing.  Abdominal:     General: Bowel sounds are normal. There is no distension.     Palpations: Abdomen is soft.     Tenderness: There is no abdominal tenderness.     Comments: Abdominal Pleurx catheter noted  Musculoskeletal:        General: Normal range of motion.     Cervical back: Normal range of motion and neck supple.     Comments: 2+ B/L LE edema  Lymphadenopathy:     Cervical: No cervical adenopathy.  Skin:    General:  Skin is warm and dry.  Neurological:     General: No  focal deficit present.  Psychiatric:        Mood and Affect: Mood normal.        Behavior: Behavior normal.      The results of significant diagnostics from this hospitalization (including imaging, microbiology, ancillary and laboratory) are listed below for reference.   Microbiology: Recent Results (from the past 240 hour(s))  Group A Strep by PCR     Status: None   Collection Time: 11/15/21  9:24 AM   Specimen: Throat; Sterile Swab  Result Value Ref Range Status   Group A Strep by PCR NOT DETECTED NOT DETECTED Final    Comment: Performed at [facility], [address], [place], [place] [address]  Resp panel by RT-PCR (RSV, Flu A&B, Covid)     Status: None   Collection Time: 11/15/21 10:00 AM  Result Value Ref Range Status   SARS Coronavirus 2 by RT PCR NEGATIVE NEGATIVE Final    Comment: (NOTE) SARS-CoV-2 target nucleic acids are NOT DETECTED.  The SARS-CoV-2 RNA is generally detectable in upper respiratory specimens during the acute phase of infection. The lowest concentration of SARS-CoV-2 viral copies this assay can detect is 138 copies/mL. A negative result does not preclude SARS-Cov-2 infection and should not be used as the sole basis for treatment or other patient management decisions. A negative result may occur with  improper specimen collection/handling, submission of specimen other than nasopharyngeal swab, presence of viral mutation(s) within the areas targeted by this assay, and inadequate number of viral copies(<138 copies/mL). A negative result must be combined with clinical observations, patient history, and epidemiological information. The expected result is Negative.  Fact Sheet for Patients:  EntrepreneurPulse.com.au  Fact Sheet for Healthcare Providers:  IncredibleEmployment.be  This test is no t yet approved or cleared by the [place] FDA  and  has been authorized for detection and/or diagnosis of SARS-CoV-2 by FDA under an Emergency Use Authorization (EUA). This EUA will remain  in effect (meaning this test can be used) for the duration of the COVID-19 declaration under Section 564(b)(1) of the Act, 21 U.S.C.section 360bbb-3(b)(1), unless the authorization is terminated  or revoked sooner.       Influenza A by PCR NEGATIVE NEGATIVE Final   Influenza B by PCR NEGATIVE NEGATIVE Final    Comment: (NOTE) The Xpert Xpress SARS-CoV-2/FLU/RSV plus assay is intended as an aid in the diagnosis of influenza from Nasopharyngeal swab specimens and should not be used as a sole basis for treatment. Nasal washings and aspirates are unacceptable for Xpert Xpress SARS-CoV-2/FLU/RSV testing.  Fact Sheet for Patients: EntrepreneurPulse.com.au  Fact Sheet for Healthcare Providers: IncredibleEmployment.be  This test is not yet approved or cleared by the [place] FDA and has been authorized for detection and/or diagnosis of SARS-CoV-2 by FDA under an Emergency Use Authorization (EUA). This EUA will remain in effect (meaning this test can be used) for the duration of the COVID-19 declaration under Section 564(b)(1) of the Act, 21 U.S.C. section 360bbb-3(b)(1), unless the authorization is terminated or revoked.     Resp Syncytial Virus by PCR NEGATIVE NEGATIVE Final    Comment: (NOTE) Fact Sheet for Patients: EntrepreneurPulse.com.au  Fact Sheet for Healthcare Providers: IncredibleEmployment.be  This test is not yet approved or cleared by the [place] FDA and has been authorized for detection and/or diagnosis of SARS-CoV-2 by FDA under an Emergency Use Authorization (EUA). This EUA will remain in effect (meaning this test can be used) for the duration of the COVID-19 declaration under  Section 564(b)(1) of the Act, 21 U.S.C. section 360bbb-3(b)(1),  unless the authorization is terminated or revoked.  Performed at [facility], [address], [place], [place] [address]   Culture, blood (Routine X 2) w Reflex to ID Panel     Status: None (Preliminary result)   Collection Time: 11/15/21  5:34 PM   Specimen: BLOOD LEFT HAND  Result Value Ref Range Status   Specimen Description   Final    BLOOD LEFT HAND SITE NOT SPECIFIED Performed at [facility] [address] [place], [place] [address]    Special Requests   Final    BOTTLES DRAWN AEROBIC ONLY Blood Culture adequate volume Performed at [facility] [address], [place], [place] [address]    Culture   Final    NO GROWTH 4 DAYS Performed at [facility], [place] [address], [place], [place] [address]    Report Status PENDING  Incomplete  Culture, blood (Routine X 2) w Reflex to ID Panel     Status: Abnormal   Collection Time: 11/15/21  5:34 PM   Specimen: BLOOD  Result Value Ref Range Status   Specimen Description   Final    BLOOD SITE NOT SPECIFIED Performed at [facility] [address], [place], [place] [address]    Special Requests   Final    BOTTLES DRAWN AEROBIC ONLY Blood Culture adequate volume Performed at [facility] [address], [place], [place] [address]    Culture  Setup Time   Final    GRAM POSITIVE COCCI IN CLUSTERS AEROBIC BOTTLE ONLY Organism ID to follow CRITICAL RESULT CALLED TO, READ BACK BY AND VERIFIED WITH: M LILLISTON,PHARMD@0030  11/17/21 [place]    Culture (A)  Final    STAPHYLOCOCCUS EPIDERMIDIS THE SIGNIFICANCE OF ISOLATING THIS ORGANISM FROM A SINGLE SET OF BLOOD CULTURES WHEN MULTIPLE SETS ARE DRAWN IS UNCERTAIN. PLEASE NOTIFY THE MICROBIOLOGY DEPARTMENT WITHIN ONE WEEK IF SPECIATION AND SENSITIVITIES ARE REQUIRED. Performed at [facility], [place] [address] [place],  [address]    Report Status 11/17/2021 FINAL  Final   Blood Culture ID Panel (Reflexed)     Status: Abnormal   Collection Time: 11/15/21  5:34 PM  Result Value Ref Range Status   Enterococcus faecalis NOT DETECTED NOT DETECTED Final   Enterococcus Faecium NOT DETECTED NOT DETECTED Final   Listeria monocytogenes NOT DETECTED NOT DETECTED Final   Staphylococcus species DETECTED (A) NOT DETECTED Final    Comment: CRITICAL RESULT CALLED TO, READ BACK BY AND VERIFIED WITH: M LILLISTON,PHARMD@0030  11/17/21 [place]    Staphylococcus aureus (BCID) NOT DETECTED NOT DETECTED Final   Staphylococcus epidermidis DETECTED (A) NOT DETECTED Final    Comment: CRITICAL RESULT CALLED TO, READ BACK BY AND VERIFIED WITH: M LILLISTON,PHARMD@0030  11/17/21 [place]    Staphylococcus lugdunensis NOT DETECTED NOT DETECTED Final   Streptococcus species NOT DETECTED NOT DETECTED Final   Streptococcus agalactiae NOT DETECTED NOT DETECTED Final   Streptococcus pneumoniae NOT DETECTED NOT DETECTED Final   Streptococcus pyogenes NOT DETECTED NOT DETECTED Final   A.calcoaceticus-baumannii NOT DETECTED NOT DETECTED Final   Bacteroides fragilis NOT DETECTED NOT DETECTED Final   Enterobacterales NOT DETECTED NOT DETECTED Final   Enterobacter cloacae complex NOT DETECTED NOT DETECTED Final   Escherichia coli NOT DETECTED NOT DETECTED Final   Klebsiella aerogenes NOT DETECTED NOT DETECTED Final   Klebsiella oxytoca NOT DETECTED NOT DETECTED Final   Klebsiella pneumoniae NOT DETECTED  NOT DETECTED Final   Proteus species NOT DETECTED NOT DETECTED Final   Salmonella species NOT DETECTED NOT DETECTED Final   Serratia marcescens NOT DETECTED NOT DETECTED Final   Haemophilus influenzae NOT DETECTED NOT DETECTED Final   Neisseria meningitidis NOT DETECTED NOT DETECTED Final   Pseudomonas aeruginosa NOT DETECTED NOT DETECTED Final   Stenotrophomonas maltophilia NOT DETECTED NOT DETECTED Final   Candida albicans NOT DETECTED NOT DETECTED Final   Candida auris NOT DETECTED NOT DETECTED  Final   Candida glabrata NOT DETECTED NOT DETECTED Final   Candida krusei NOT DETECTED NOT DETECTED Final   Candida parapsilosis NOT DETECTED NOT DETECTED Final   Candida tropicalis NOT DETECTED NOT DETECTED Final   Cryptococcus neoformans/gattii NOT DETECTED NOT DETECTED Final   Methicillin resistance mecA/C NOT DETECTED NOT DETECTED Final    Comment: Performed at [facility], [address], [place], [place] [address]  Culture, blood (Routine X 2) w Reflex to ID Panel     Status: None (Preliminary result)   Collection Time: 11/17/21 12:34 PM   Specimen: BLOOD  Result Value Ref Range Status   Specimen Description   Final    BLOOD LEFT ANTECUBITAL Performed at [facility] [address] [place], [place] [address]    Special Requests   Final    BOTTLES DRAWN AEROBIC AND ANAEROBIC Blood Culture adequate volume Performed at [facility] [address], [place], [place] [address]    Culture   Final    NO GROWTH 2 DAYS Performed at [facility] [address], [place], [place] [address]    Report Status PENDING  Incomplete  Culture, blood (Routine X 2) w Reflex to ID Panel     Status: None (Preliminary result)   Collection Time: 11/17/21 12:34 PM   Specimen: BLOOD  Result Value Ref Range Status   Specimen Description   Final    BLOOD BLOOD LEFT HAND Performed at [facility] [address] [place], [place] [address]    Special Requests   Final    BOTTLES DRAWN AEROBIC AND ANAEROBIC Blood Culture adequate volume Performed at [facility] [address] [place],  [address]    Culture   Final    NO GROWTH 2 DAYS Performed at [facility] [address] [place],  [address]    Report Status PENDING  Incomplete     Labs: BNP (last 3 results) No results for input(s): "BNP" in the last 8760 hours. Basic Metabolic Panel: Recent Labs  Lab 11/15/21 1734  11/16/21 0012 11/16/21 0538 11/16/21 1739 11/17/21 0432 11/18/21 0509 11/19/21 0504  NA 121* 122* 122* 122* 124* 127* 128*  K 6.2* 5.9* 5.6* 5.0 4.4 3.9 4.6  CL 90* 92* 91* 91* 94* 98 99  CO2 $Re'22 22 23 22 24 25 25  'tlc$ GLUCOSE 117* 145* 136* 192* 151* 111* 101*  BUN 63* 64* 69* 72* 63* 39* 30*  CREATININE 1.54* 1.68* 1.77* 1.89* 1.44* 0.73 0.77  CALCIUM 10.0 9.6 9.4 9.2 9.0 8.7* 8.8*  MG  --   --   --   --  2.2 2.2 2.2  PHOS 5.0* 5.1* 5.2*  --   --   --   --    Liver Function Tests: Recent Labs  Lab 11/15/21 0924 11/15/21 1734 11/16/21 0012 11/16/21 0538 11/17/21 0432 11/18/21 0509 11/19/21 0504  AST 91*  --   --   --  96* 93* 112*  ALT 56*  --   --   --  46* 39 43  ALKPHOS 438*  --   --   --  339* 288* 356*  BILITOT 8.8*  --   --   --  4.2* 4.2* 5.0*  PROT 6.1*  --   --   --  4.9* 4.7* 5.0*  ALBUMIN 2.5*   < > 1.8* 1.9* 2.4* 2.9* 2.9*   < > = values in this interval not displayed.   No results for input(s): "LIPASE", "AMYLASE" in the last 168 hours. No results for input(s): "AMMONIA" in the last 168 hours. CBC: Recent Labs  Lab 11/15/21 0924 11/16/21 0538 11/17/21 0432 11/18/21 0509 11/19/21 0504  WBC 2.2* 4.8 5.1 5.8 9.3  NEUTROABS 0.6* 1.9 2.4 3.0 6.8  HGB 13.8 12.6* 10.1* 8.9* 10.0*  HCT 37.9* 35.6* 28.6* 25.2* 29.1*  MCV 92.7 94.7 95.0 96.2 98.3  PLT 109* 142* 114* 121* 124*   Cardiac Enzymes: No results for input(s): "CKTOTAL", "CKMB", "CKMBINDEX", "TROPONINI" in the last 168 hours. BNP: Invalid input(s): "POCBNP" CBG: Recent Labs  Lab 11/18/21 1148 11/18/21 1749 11/18/21 2122 11/19/21 0307 11/19/21 0828  GLUCAP 147* 171* 161* 149* 126*   D-Dimer No results for input(s): "DDIMER" in the last 72 hours. Hgb A1c No results for input(s): "HGBA1C" in the last 72 hours. Lipid Profile No results for input(s): "CHOL", "HDL", "LDLCALC", "TRIG", "CHOLHDL", "LDLDIRECT" in the last 72 hours. Thyroid function studies No results for input(s): "TSH",  "T4TOTAL", "T3FREE", "THYROIDAB" in the last 72 hours.  Invalid input(s): "FREET3" Anemia work up No results for input(s): "VITAMINB12", "FOLATE", "FERRITIN", "TIBC", "IRON", "RETICCTPCT" in the last 72 hours. Urinalysis    Component Value Date/Time   COLORURINE ORANGE (A) 10/10/2021 1155   APPEARANCEUR HAZY (A) 10/10/2021 1155   LABSPEC 1.031 (H) 10/10/2021 1155   PHURINE 5.5 10/10/2021 1155   GLUCOSEU NEGATIVE 10/10/2021 1155   HGBUR NEGATIVE 10/10/2021 1155   BILIRUBINUR NEGATIVE 10/10/2021 1155   KETONESUR TRACE (A) 10/10/2021 1155   PROTEINUR 30 (A) 10/10/2021 1155   UROBILINOGEN 0.2 10/06/2013 1214   NITRITE NEGATIVE 10/10/2021 1155   LEUKOCYTESUR NEGATIVE 10/10/2021 1155   Sepsis Labs Recent Labs  Lab 11/16/21 0538 11/17/21 0432 11/18/21 0509 11/19/21 0504  WBC 4.8 5.1 5.8 9.3   Microbiology Recent Results (from the past 240 hour(s))  Group A Strep by PCR     Status: None   Collection Time: 11/15/21  9:24 AM   Specimen: Throat; Sterile Swab  Result Value Ref Range Status   Group A Strep by PCR NOT DETECTED NOT DETECTED Final    Comment: Performed at [facility], [address] [place], [place] [address]  Resp panel by RT-PCR (RSV, Flu A&B, Covid)     Status: None   Collection Time: 11/15/21 10:00 AM  Result Value Ref Range Status   SARS Coronavirus 2 by RT PCR NEGATIVE NEGATIVE Final    Comment: (NOTE) SARS-CoV-2 target nucleic acids are NOT DETECTED.  The SARS-CoV-2 RNA is generally detectable in upper respiratory specimens during the acute phase of infection. The lowest concentration of SARS-CoV-2 viral copies this assay can detect is 138 copies/mL. A negative result does not preclude SARS-Cov-2 infection and should not be used as the sole basis for treatment or other patient management decisions. A negative result may occur with  improper specimen collection/handling, submission of specimen other than nasopharyngeal swab, presence of  viral mutation(s) within the areas targeted by this  assay, and inadequate number of viral copies(<138 copies/mL). A negative result must be combined with clinical observations, patient history, and epidemiological information. The expected result is Negative.  Fact Sheet for Patients:  EntrepreneurPulse.com.au  Fact Sheet for Healthcare Providers:  IncredibleEmployment.be  This test is no t yet approved or cleared by the [place] FDA and  has been authorized for detection and/or diagnosis of SARS-CoV-2 by FDA under an Emergency Use Authorization (EUA). This EUA will remain  in effect (meaning this test can be used) for the duration of the COVID-19 declaration under Section 564(b)(1) of the Act, 21 U.S.C.section 360bbb-3(b)(1), unless the authorization is terminated  or revoked sooner.       Influenza A by PCR NEGATIVE NEGATIVE Final   Influenza B by PCR NEGATIVE NEGATIVE Final    Comment: (NOTE) The Xpert Xpress SARS-CoV-2/FLU/RSV plus assay is intended as an aid in the diagnosis of influenza from Nasopharyngeal swab specimens and should not be used as a sole basis for treatment. Nasal washings and aspirates are unacceptable for Xpert Xpress SARS-CoV-2/FLU/RSV testing.  Fact Sheet for Patients: EntrepreneurPulse.com.au  Fact Sheet for Healthcare Providers: IncredibleEmployment.be  This test is not yet approved or cleared by the [place] FDA and has been authorized for detection and/or diagnosis of SARS-CoV-2 by FDA under an Emergency Use Authorization (EUA). This EUA will remain in effect (meaning this test can be used) for the duration of the COVID-19 declaration under Section 564(b)(1) of the Act, 21 U.S.C. section 360bbb-3(b)(1), unless the authorization is terminated or revoked.     Resp Syncytial Virus by PCR NEGATIVE NEGATIVE Final    Comment: (NOTE) Fact Sheet for  Patients: EntrepreneurPulse.com.au  Fact Sheet for Healthcare Providers: IncredibleEmployment.be  This test is not yet approved or cleared by the [place] FDA and has been authorized for detection and/or diagnosis of SARS-CoV-2 by FDA under an Emergency Use Authorization (EUA). This EUA will remain in effect (meaning this test can be used) for the duration of the COVID-19 declaration under Section 564(b)(1) of the Act, 21 U.S.C. section 360bbb-3(b)(1), unless the authorization is terminated or revoked.  Performed at [facility], [address] [place], [place] [address]   Culture, blood (Routine X 2) w Reflex to ID Panel     Status: None (Preliminary result)   Collection Time: 11/15/21  5:34 PM   Specimen: BLOOD LEFT HAND  Result Value Ref Range Status   Specimen Description   Final    BLOOD LEFT HAND SITE NOT SPECIFIED Performed at [facility] [address] [place], [place] [address]    Special Requests   Final    BOTTLES DRAWN AEROBIC ONLY Blood Culture adequate volume Performed at [facility] [address], [place], [place] [address]    Culture   Final    NO GROWTH 4 DAYS Performed at [facility], [place] [address], [place], [place] [address]    Report Status PENDING  Incomplete  Culture, blood (Routine X 2) w Reflex to ID Panel     Status: Abnormal   Collection Time: 11/15/21  5:34 PM   Specimen: BLOOD  Result Value Ref Range Status   Specimen Description   Final    BLOOD SITE NOT SPECIFIED Performed at [facility] [address] [place], [place] [address]    Special Requests   Final    BOTTLES DRAWN AEROBIC ONLY Blood Culture adequate volume Performed at [facility]  [address] [place] [address], [place], [place] [address]    Culture  Setup Time   Final    GRAM POSITIVE COCCI IN CLUSTERS AEROBIC BOTTLE ONLY Organism ID to  follow CRITICAL RESULT CALLED TO, READ BACK BY AND VERIFIED WITH: M LILLISTON,[email or web address]  11/17/21 [place]    Culture (A)  Final    STAPHYLOCOCCUS EPIDERMIDIS THE SIGNIFICANCE OF ISOLATING THIS ORGANISM FROM A SINGLE SET OF BLOOD CULTURES WHEN MULTIPLE SETS ARE DRAWN IS UNCERTAIN. PLEASE NOTIFY THE MICROBIOLOGY DEPARTMENT WITHIN ONE WEEK IF SPECIATION AND SENSITIVITIES ARE REQUIRED. Performed at [facility], [place] [address], [place], [place] [address]    Report Status 11/17/2021 FINAL  Final  Blood Culture ID Panel (Reflexed)     Status: Abnormal   Collection Time: 11/15/21  5:34 PM  Result Value Ref Range Status   Enterococcus faecalis NOT DETECTED NOT DETECTED Final   Enterococcus Faecium NOT DETECTED NOT DETECTED Final   Listeria monocytogenes NOT DETECTED NOT DETECTED Final   Staphylococcus species DETECTED (A) NOT DETECTED Final    Comment: CRITICAL RESULT CALLED TO, READ BACK BY AND VERIFIED WITH: M LILLISTON,[email or web address]@0030  11/17/21 [place]    Staphylococcus aureus (BCID) NOT DETECTED NOT DETECTED Final   Staphylococcus epidermidis DETECTED (A) NOT DETECTED Final    Comment: CRITICAL RESULT CALLED TO, READ BACK BY AND VERIFIED WITH: M LILLISTON,[email or web address]@0030  11/17/21 [place]    Staphylococcus lugdunensis NOT DETECTED NOT DETECTED Final   Streptococcus species NOT DETECTED NOT DETECTED Final   Streptococcus agalactiae NOT DETECTED NOT DETECTED Final   Streptococcus pneumoniae NOT DETECTED NOT DETECTED Final   Streptococcus pyogenes NOT DETECTED NOT DETECTED Final   A.calcoaceticus-baumannii NOT DETECTED NOT DETECTED Final   Bacteroides fragilis NOT DETECTED NOT DETECTED Final   Enterobacterales NOT DETECTED NOT DETECTED Final   Enterobacter cloacae complex NOT DETECTED NOT DETECTED Final   Escherichia coli NOT DETECTED NOT DETECTED Final   Klebsiella aerogenes NOT DETECTED NOT DETECTED Final   Klebsiella oxytoca NOT DETECTED NOT DETECTED Final   Klebsiella pneumoniae NOT DETECTED NOT  DETECTED Final   Proteus species NOT DETECTED NOT DETECTED Final   Salmonella species NOT DETECTED NOT DETECTED Final   Serratia marcescens NOT DETECTED NOT DETECTED Final   Haemophilus influenzae NOT DETECTED NOT DETECTED Final   Neisseria meningitidis NOT DETECTED NOT DETECTED Final   Pseudomonas aeruginosa NOT DETECTED NOT DETECTED Final   Stenotrophomonas maltophilia NOT DETECTED NOT DETECTED Final   Candida albicans NOT DETECTED NOT DETECTED Final   Candida auris NOT DETECTED NOT DETECTED Final   Candida glabrata NOT DETECTED NOT DETECTED Final   Candida krusei NOT DETECTED NOT DETECTED Final   Candida parapsilosis NOT DETECTED NOT DETECTED Final   Candida tropicalis NOT DETECTED NOT DETECTED Final   Cryptococcus neoformans/gattii NOT DETECTED NOT DETECTED Final   Methicillin resistance mecA/C NOT DETECTED NOT DETECTED Final    Comment: Performed at [facility], [address], [place], [place] [address]  Culture, blood (Routine X 2) w Reflex to ID Panel     Status: None (Preliminary result)   Collection Time: 11/17/21 12:34 PM   Specimen: BLOOD  Result Value Ref Range Status   Specimen Description   Final    BLOOD LEFT ANTECUBITAL Performed at [facility] [address] [place], [place] [address]    Special Requests   Final    BOTTLES DRAWN AEROBIC AND ANAEROBIC Blood Culture adequate volume Performed at [facility] [address], [place], [place] [address]  Culture   Final    NO GROWTH 2 DAYS Performed at [facility], [place] [address] [place], [place] [address]    Report Status PENDING  Incomplete  Culture, blood (Routine X 2) w Reflex to ID Panel     Status: None (Preliminary result)   Collection Time: 11/17/21 12:34 PM   Specimen: BLOOD  Result Value Ref Range Status   Specimen Description   Final    BLOOD BLOOD LEFT HAND Performed at [facility] [address], [place], [place]  [address]    Special Requests   Final    BOTTLES DRAWN AEROBIC AND ANAEROBIC Blood Culture adequate volume Performed at [facility] [address], [place], [place] [address]    Culture   Final    NO GROWTH 2 DAYS Performed at [facility] [address] [place], [place] [address]    Report Status PENDING  Incomplete    Procedures/Studies: US Abdomen Complete  Result Date: 11/15/2021 CLINICAL DATA:  Acute kidney injury, ascites, history of cholangiocarcinoma EXAM: ABDOMEN ULTRASOUND COMPLETE COMPARISON:  CT abdomen/pelvis dated 09/06/2021 FINDINGS: Gallbladder: Gallbladder wall thickening, measuring 6 mm, likely related to underdistention. No cholelithiasis. Common bile duct: Not visualized. Liver: Multiple hepatic metastases, measuring up to 4.8 cm in the left hepatic lobe. Portal vein is patent on color Doppler imaging with normal direction of blood flow towards the liver. IVC: Poorly visualized. Pancreas: Poorly visualized. Spleen: Size and appearance within normal limits. Right Kidney: Length: 14.9 cm. Echogenic renal parenchyma. No mass or hydronephrosis. Left Kidney: Length: 11.8 cm. Echogenic renal parenchyma. No mass or hydronephrosis. Abdominal aorta: No aneurysm visualized. Other findings: Abdominal ascites. IMPRESSION: Multiple hepatic metastases, measuring up to 4.8 cm in the left hepatic lobe. Abdominal ascites. Echogenic renal parenchyma, suggesting medical renal disease. No hydronephrosis. Electronically Signed   By: Julian Hy M.D.   On: 11/15/2021 20:23   DG Chest 2 View  Result Date: 11/15/2021 CLINICAL DATA:  46 year old male with cough and sore throat. Productive cough. Cholangiocarcinoma with malignant ascites. EXAM: CHEST - 2 VIEW COMPARISON:  CT Chest, Abdomen, and Pelvis 07/13/2021 and earlier. FINDINGS: AP semi upright view at upright AP and lateral views of the chest at 0905 hours. Lower lung volumes. Stable right chest power port.  Mediastinal contours are stable and within normal limits. A battery device external to the chest is visible only on the lateral view. Visualized tracheal air column is within normal limits. No pneumothorax, pulmonary edema, pleural effusion or confluent pulmonary opacity. Nonobstructed visible bowel gas pattern. No acute osseous abnormality identified. IMPRESSION: Low lung volumes.  No acute cardiopulmonary abnormality. Electronically Signed   By: Genevie Ann M.D.   On: 11/15/2021 09:21   IR Paracentesis  Result Date: 11/06/2021 INDICATION: 46 year old male history of cholangiocarcinoma with recurrent malignant ascites. Patient presents for paracentesis due to leaking around existing abdominal PleurX exit site. EXAM: ULTRASOUND GUIDED therapeutic PARACENTESIS MEDICATIONS: None. COMPLICATIONS: None immediate. PROCEDURE: Informed written consent was obtained from the patient after a discussion of the risks, benefits and alternatives to treatment. A timeout was performed prior to the initiation of the procedure. Initial ultrasound scanning demonstrates a moderate amount of ascites within the right lower abdominal quadrant. The right lower abdomen was prepped and draped in the usual sterile fashion. An ultrasound image was saved for documentation purposesUsing PleurX adapter kit. The paracentesis was performed. The catheter was removed and a dressing was applied. The patient tolerated the procedure well  without immediate post procedural complication. Following the procedure a pursestring suture was placed by IR attending Dr. Wanda Plump. Mir FINDINGS: A total of approximately 4 L of straw-colored fluid was removed. IMPRESSION: Successful ultrasound-guided therapeutic paracentesis yielding 4 liters of peritoneal fluid. Electronically Signed   By: Miachel Roux M.D.   On: 11/06/2021 15:55   IR IMAGE GUIDED DRAINAGE PERCUT CATH  PERITONEAL RETROPERIT  Result Date: 10/31/2021 INDICATION: Recurrent malignant ascites,  cholangiocarcinoma EXAM: Placement of tunneled peritoneal drainage catheter using ultrasound and fluoroscopic guidance MEDICATIONS: Per EMR ANESTHESIA/SEDATION: Moderate (conscious) sedation was employed during this procedure. A total of Versed 1 mg and Fentanyl 25 mcg was administered intravenously by the radiology nurse. Total intra-service moderate Sedation Time: 21 minutes. The patient's level of consciousness and vital signs were monitored continuously by radiology nursing throughout the procedure under my direct supervision. COMPLICATIONS: None immediate. PROCEDURE: Informed written consent was obtained from the patient after a thorough discussion of the procedural risks, benefits and alternatives. All questions were addressed. Maximal Sterile Barrier Technique was utilized including caps, mask, sterile gowns, sterile gloves, sterile drape, hand hygiene and skin antiseptic. A timeout was performed prior to the initiation of the procedure. The patient was placed supine on the exam table. Limited ultrasound of left lower quadrant was performed for planning purposes. This demonstrated a large volume ascites. Skin entry site was marked, and the overlying skin was prepped and draped in the standard sterile fashion. Local analgesia was obtained with 1% lidocaine. Under ultrasound guidance, access was obtained into the left lower quadrant peritoneal cavity 19 gauge Yueh needle. An 035 wire was advanced into the peritoneal space and confirmed with fluoroscopy. The percutaneous tract was then dilated to accommodate a peel-away sheath. Attention was then turned to a more medial location on the abdominal wall. After the administration of additional local anesthetic, a cuffed peritoneal drainage catheter was tunneled to the initial access site. Through the peel-away sheath, the peritoneal drainage catheter was advanced into the peritoneal space. Location was confirmed with fluoroscopy. The catheter was tested and found to  function properly. Approximately 7,000 ml of clear, straw-colored ascites was removed. The catheter was secured to the skin using silk suture and a dressing. The initial access site was closed with 4-0 Vicryl and Dermabond. The patient tolerated the procedure well without immediate complication. IMPRESSION: Successful placement of a tunneled peritoneal drainage catheter in the left lower quadrant. Electronically Signed   By: Albin Felling M.D.   On: 10/31/2021 14:11   IR Paracentesis  Result Date: 10/25/2021 INDICATION: Cholangiogram with recurrent malignant ascites. Request for therapeutic paracentesis. EXAM: ULTRASOUND GUIDED THERAPEUTIC RIGHT LOWER QUADRANT PARACENTESIS MEDICATIONS: 12 mL 1 % lidocaine COMPLICATIONS: None immediate. PROCEDURE: Informed written consent was obtained from the patient after a discussion of the risks, benefits and alternatives to treatment. A timeout was performed prior to the initiation of the procedure. Initial ultrasound scanning demonstrates a moderate amount of ascites within the right lower abdominal quadrant. The right lower abdomen was prepped and draped in the usual sterile fashion. 1% lidocaine was used for local anesthesia. Following this, a 19 gauge, 10-cm, Yueh catheter was introduced. An ultrasound image was saved for documentation purposes. The paracentesis was performed. The catheter was removed and a dressing was applied. The patient tolerated the procedure well without immediate post procedural complication. Patient received post-procedure intravenous albumin; see nursing notes for details. FINDINGS: A total of approximately 5 L of clear, yellow fluid was removed. IMPRESSION: Successful ultrasound-guided paracentesis yielding 5 liters  of peritoneal fluid. Read by: Narda Rutherford, AGNP-BC Electronically Signed   By: Corrie Mckusick D.O.   On: 10/25/2021 15:21     Time coordinating discharge: Over 30 minutes    Dwyane Dee, MD  [facility] 11/19/2021,  3:17 PM

## 2021-11-19 NOTE — Progress Notes (Signed)
IP PROGRESS NOTE  Subjective:   [name] reports feeling better.  Heartburn has improved, but still present.  His wife says that she drained the peritoneal catheter yesterday.  He was constipated, but had a bowel movement yesterday.   Objective: Vital signs in last 24 hours: Blood pressure (!) 113/59, pulse 88, temperature 98.6 F (37 C), temperature source Oral, resp. rate 20, height $RemoveBe'6\' 2"'FDTuTNkTI$  (1.88 m), weight 185 lb (83.9 kg), SpO2 98 %.  Intake/Output from previous day: 10/15 0701 - 10/16 0700 In: 1958.6 [P.O.:120; I.V.:1838.6] Out: 1500 [Urine:500; Drains:1000]  Physical Exam:  HEENT: No thrush or ulcers Lungs: Clear anteriorly, no respiratory distress Cardiac: Regular rate and rhythm Abdomen: Mildly distended with ascites, left abdomen peritoneal drain with a gauze dressing Vascular: No leg edema  Portacath/PICC-without erythema  Lab Results: Recent Labs    11/17/21 0432 11/18/21 0509  WBC 5.1 5.8  HGB 10.1* 8.9*  HCT 28.6* 25.2*  PLT 114* 121*    BMET Recent Labs    11/17/21 0432 11/18/21 0509  NA 124* 127*  K 4.4 3.9  CL 94* 98  CO2 24 25  GLUCOSE 151* 111*  BUN 63* 39*  CREATININE 1.44* 0.73  CALCIUM 9.0 8.7*    Lab Results  Component Value Date   CEA1 2.15 01/14/2020   [id number] 209 (H) 09/24/2021   No results found.  Medications: I have reviewed the patient's current medications.  Assessment/Plan:  Cholangiocarcinoma  multiple liver masses and abdominal lymphadenopathy CTs 01/13/2020-rounded hypodense mass appears to arise from the pancreas neck, multiple rim-enhancing masses in the liver, primarily left liver with segmental dilation of the left lobe Intermatic bile ducts, ill-defined hypodensity the central liver with effacement of the left portal vein, enlarged portacaval and retroperitoneal lymph nodes Ultrasound-guided biopsy of the left liver lesion 01/19/2020-adenocarcinoma, cytokeratin 7+, MSS, tumor mutation burden 1, IDH1 R132C MRI  abdomen 01/31/2020-poorly marginated central liver mass, multiple smaller similar satellite liver masses, extrinsic mass-effect at the biliary hilum with intrahepatic biliary ductal dilatation throughout the left liver with mild Intermatic dilatation the superior right liver, no pancreas mass or ductal dilatation, normal spleen size, numerous enlarged enhancing lymph nodes at the porta hepatis, peripancreatic, portacaval, aortocaval, left periaortic chains Cycle 1 gemcitabine/cisplatin 02/04/2020 Cycle 2 gemcitabine/cisplatin 02/28/2020 Cycle 3 gemcitabine/cisplatin 03/20/2020 CT abdomen/pelvis 03/31/2020-mild decrease in central liver tumor, mild decrease in size of liver metastases and upper abdominal adenopathy, new splenomegaly Cycle 4 gemcitabine/cisplatin 04/10/2020 Cycle 5 gemcitabine/cisplatin 05/01/2020 Cycle 6 gemcitabine/cisplatin plus durvalumab 05/22/2020 CTs 06/06/2020- dominant central liver mass mildly enlarged, other liver lesions and abdominal adenopathy is stable, no evidence of metastatic disease to the chest Cycle 7 gemcitabine/cisplatin plus Durvalumab 06/12/2020 Cycle 8 gemcitabine/cisplatin plus Durvalumab 06/30/2020 Cycle 9 gemcitabine/cisplatin plus Durvalumab 07/31/2020 CT abdomen/pelvis 08/20/2020-decreased size of dominant central liver mass, slight increase in size and stable additional liver lesions, stable portacaval node Cycle 10 gemcitabine/cisplatin plus Durvalumab 08/21/2020 Cycle 11 gemcitabine/cisplatin plus Durvalumab 09/11/2020 Cycle 12 gemcitabine/cisplatin plus Durvalumab 10/02/2020 Cycle 13 gemcitabine/cisplatin plus Durvalumab 10/23/2020 CT abdomen/pelvis 11/10/2020-no change in dominant central liver mass and additional liver lesions, stable upper retroperitoneal adenopathy Cycle 14 gemcitabine/cisplatin plus Durvalumab 11/13/2020 Cycle 15 gemcitabine/cisplatin plus Durvalumab 12/04/2020 Cycle 15 gemcitabine/cisplatin plus Durvalumab 01/08/2021 Cycle 16  gemcitabine/cisplatin plus Durvalumab 01/30/2021-cisplatin held from day 1 and day 8 secondary to neuropathy CTs 02/15/2021-slight increase in size of central liver mass, progressive metastatic lesions in the right liver, stable to slightly decreased periportal lymph nodes, progression of splenomegaly, chronically thrombosed left portal vein Cycle 1  FOLFOX 03/05/2021 Cycle 2 FOLFOX 03/19/2021, oxaliplatin dose reduced due to thrombocytopenia Cycle 3 FOLFOX 04/02/2021 Cycle 4 FOLFOX 04/16/2021 Cycle 5 FOLFOX 04/30/2021 CTs 05/10/2021-new and enlarging liver metastases, chronic left lobe biliary dilatation, chronic left portal vein thrombosis, stable porta hepatis adenopathy, splenomegaly, no evidence of metastatic disease to the chest Ivosidenib 05/21/2021 CT 07/13/2021-increase in size of periportal node, stable retroperitoneal nodes, dominant left hepatic mass-stable, increased necrosis of other liver lesions, some have increased in size Ivosidenib continued CT abdomen/pelvis 09/06/2021-progression of ascites, progression of liver metastases, stable necrotic lymphadenopathy in the periportal region Ivosedinib discontinued 09/07/2021 Cycle 1 FOLFIRI 09/24/2021 Cycle 2 held on 10/10/2021 due to neutropenia, elevated bilirubin Cycle 2 FOLFIRI 10/22/2021 Cycle 3 FOLFIRI 11/05/2021   Cough-likely related to diaphragmatic irritation from #1 Anorexia/weight loss Hypercalcemia-likely hypercalcemia malignancy, status post intravenous hydration and Zometa 01/14/2020 Diabetes Hyperlipidemia Hypertension History of peripheral neuropathy secondary to diabetes Severe back pain following Fulphila-oxycodone prescribed Thrombocytopenia secondary to chemotherapy-gemcitabine held with day 1 cycle 8 and then dose reduced COVID-19 infection 07/22/2020, 12/25/2020 Cisplatin induced peripheral neuropathy Admission 11/15/2021 with oropharyngeal candidiasis, mucositis?,  Neutropenia, and multiple electrolyte abnormalities      [name] is now a day 15 following cycle 3 FOLFIRI.  The neutrophil count has recovered.  He was admitted with dehydration, candidiasis, and mucositis.  His clinical status has improved.  He is stable for discharge from an oncology standpoint.  Outpatient follow-up will be scheduled at the Cancer center.  We will consider continuing FOLFIRI if his performance status and the bilirubin improved.  He is considering a clinical trial at the [facility], but I doubt he will be eligible with the current bilirubin elevation.  Recommendations: He appears stable for discharge Advance to regular diet Complete outpatient course of Diflucan for oropharyngeal candidiasis Continue Carafate slurry and Magic mouthwash as needed Outpatient follow-up will be scheduled at the cancer center     LOS: 4 days   Betsy Coder, MD   11/19/2021, 5:18 AM

## 2021-11-19 NOTE — Progress Notes (Signed)
    [facility] for Infectious Disease   Reason for visit: Follow up on esophageal candidiasis  Interval History: transitioned to oral fluconazole  Physical Exam: Constitutional:  Vitals:   11/18/21 2124 11/19/21 0309  BP: 106/61 (!) 113/59  Pulse: 93 88  Resp: 17 20  Temp: 98.3 F (36.8 C) 98.6 F (37 C)  SpO2: 99% 98%   patient appears in NAD  Impression: likely esophageal candidiasis.   Plan: 1.  Fluconazole 400 mg po daily for 3 weeks total treatment 2. Follow up LFTs in 4-5 days after discharge  I will sign off, call with questions.

## 2021-11-20 ENCOUNTER — Other Ambulatory Visit (HOSPITAL_BASED_OUTPATIENT_CLINIC_OR_DEPARTMENT_OTHER): Payer: Self-pay

## 2021-11-20 ENCOUNTER — Encounter: Payer: Self-pay | Admitting: *Deleted

## 2021-11-20 LAB — CULTURE, BLOOD (ROUTINE X 2)
Culture: NO GROWTH
Special Requests: ADEQUATE

## 2021-11-20 NOTE — Progress Notes (Signed)
Faxed D/C summary from 11/15/21 admission to [facility] case manager at [phone].

## 2021-11-21 ENCOUNTER — Inpatient Hospital Stay: Payer: No Typology Code available for payment source

## 2021-11-21 ENCOUNTER — Encounter: Payer: Self-pay | Admitting: Oncology

## 2021-11-21 ENCOUNTER — Telehealth: Payer: Self-pay | Admitting: *Deleted

## 2021-11-21 NOTE — Telephone Encounter (Signed)
Matthew Osborne called to inquire about repeat labs on Matthew Osborne. She agrees to Friday in am using his port. Scheduler notified.

## 2021-11-22 ENCOUNTER — Encounter: Payer: Self-pay | Admitting: *Deleted

## 2021-11-22 LAB — CULTURE, BLOOD (ROUTINE X 2)
Culture: NO GROWTH
Culture: NO GROWTH
Special Requests: ADEQUATE
Special Requests: ADEQUATE

## 2021-11-23 ENCOUNTER — Inpatient Hospital Stay: Payer: No Typology Code available for payment source

## 2021-11-23 ENCOUNTER — Other Ambulatory Visit: Payer: Self-pay | Admitting: Nurse Practitioner

## 2021-11-23 ENCOUNTER — Telehealth: Payer: Self-pay

## 2021-11-23 ENCOUNTER — Telehealth: Payer: Self-pay | Admitting: Nurse Practitioner

## 2021-11-23 ENCOUNTER — Other Ambulatory Visit (HOSPITAL_BASED_OUTPATIENT_CLINIC_OR_DEPARTMENT_OTHER): Payer: Self-pay

## 2021-11-23 ENCOUNTER — Other Ambulatory Visit: Payer: Self-pay

## 2021-11-23 ENCOUNTER — Other Ambulatory Visit (HOSPITAL_COMMUNITY): Payer: Self-pay

## 2021-11-23 VITALS — BP 96/63 | HR 96 | Temp 98.1°F | Resp 18

## 2021-11-23 DIAGNOSIS — E1142 Type 2 diabetes mellitus with diabetic polyneuropathy: Secondary | ICD-10-CM | POA: Diagnosis not present

## 2021-11-23 DIAGNOSIS — E875 Hyperkalemia: Secondary | ICD-10-CM

## 2021-11-23 DIAGNOSIS — I10 Essential (primary) hypertension: Secondary | ICD-10-CM | POA: Diagnosis not present

## 2021-11-23 DIAGNOSIS — C221 Intrahepatic bile duct carcinoma: Secondary | ICD-10-CM

## 2021-11-23 DIAGNOSIS — E785 Hyperlipidemia, unspecified: Secondary | ICD-10-CM | POA: Diagnosis not present

## 2021-11-23 DIAGNOSIS — Z5111 Encounter for antineoplastic chemotherapy: Secondary | ICD-10-CM | POA: Diagnosis present

## 2021-11-23 DIAGNOSIS — D696 Thrombocytopenia, unspecified: Secondary | ICD-10-CM | POA: Diagnosis not present

## 2021-11-23 DIAGNOSIS — C250 Malignant neoplasm of head of pancreas: Secondary | ICD-10-CM

## 2021-11-23 DIAGNOSIS — Z5189 Encounter for other specified aftercare: Secondary | ICD-10-CM | POA: Diagnosis not present

## 2021-11-23 LAB — CMP (CANCER CENTER ONLY)
ALT: 41 U/L (ref 0–44)
AST: 119 U/L — ABNORMAL HIGH (ref 15–41)
Albumin: 2.5 g/dL — ABNORMAL LOW (ref 3.5–5.0)
Alkaline Phosphatase: 468 U/L — ABNORMAL HIGH (ref 38–126)
Anion gap: 4 — ABNORMAL LOW (ref 5–15)
BUN: 27 mg/dL — ABNORMAL HIGH (ref 6–20)
CO2: 28 mmol/L (ref 22–32)
Calcium: 8.4 mg/dL — ABNORMAL LOW (ref 8.9–10.3)
Chloride: 92 mmol/L — ABNORMAL LOW (ref 98–111)
Creatinine: 0.8 mg/dL (ref 0.61–1.24)
GFR, Estimated: >60 mL/min (ref >60)
Glucose, Bld: 144 mg/dL — ABNORMAL HIGH (ref 70–99)
Potassium: 5.4 mmol/L — ABNORMAL HIGH (ref 3.5–5.1)
Sodium: 124 mmol/L — ABNORMAL LOW (ref 135–145)
Total Bilirubin: 4.7 mg/dL (ref 0.3–1.2)
Total Protein: 5.2 g/dL — ABNORMAL LOW (ref 6.5–8.1)

## 2021-11-23 LAB — CBC WITH DIFFERENTIAL (CANCER CENTER ONLY)
Abs Immature Granulocytes: 0.88 10*3/uL — ABNORMAL HIGH (ref 0.00–0.07)
Basophils Absolute: 0.2 10*3/uL — ABNORMAL HIGH (ref 0.0–0.1)
Basophils Relative: 1 %
Eosinophils Absolute: 0.1 10*3/uL (ref 0.0–0.5)
Eosinophils Relative: 1 %
HCT: 31.7 % — ABNORMAL LOW (ref 39.0–52.0)
Hemoglobin: 11.3 g/dL — ABNORMAL LOW (ref 13.0–17.0)
Immature Granulocytes: 5 %
Lymphocytes Relative: 8 %
Lymphs Abs: 1.4 10*3/uL (ref 0.7–4.0)
MCH: 34.7 pg — ABNORMAL HIGH (ref 26.0–34.0)
MCHC: 35.6 g/dL (ref 30.0–36.0)
MCV: 97.2 fL (ref 80.0–100.0)
Monocytes Absolute: 1.5 10*3/uL — ABNORMAL HIGH (ref 0.1–1.0)
Monocytes Relative: 8 %
Neutro Abs: 13.3 10*3/uL — ABNORMAL HIGH (ref 1.7–7.7)
Neutrophils Relative %: 77 %
Platelet Count: 148 10*3/uL — ABNORMAL LOW (ref 150–400)
RBC: 3.26 MIL/uL — ABNORMAL LOW (ref 4.22–5.81)
RDW: 18.9 % — ABNORMAL HIGH (ref 11.5–15.5)
WBC Count: 17.3 10*3/uL — ABNORMAL HIGH (ref 4.0–10.5)
nRBC: 0 % (ref 0.0–0.2)

## 2021-11-23 MED ORDER — HEPARIN SOD (PORK) LOCK FLUSH 100 UNIT/ML IV SOLN
500.0000 [IU] | Freq: Once | INTRAVENOUS | Status: AC
  Administered 2021-11-23: 500 [IU] via INTRAVENOUS

## 2021-11-23 MED ORDER — SODIUM POLYSTYRENE SULFONATE 15 GM/60ML PO SUSP
30.0000 g | Freq: Once | ORAL | 0 refills | Status: AC
  Filled 2021-11-23 (×2): qty 120, 1d supply, fill #0

## 2021-11-23 MED ORDER — SODIUM CHLORIDE 0.9% FLUSH
10.0000 mL | Freq: Once | INTRAVENOUS | Status: AC
  Administered 2021-11-23: 10 mL via INTRAVENOUS

## 2021-11-23 NOTE — Telephone Encounter (Signed)
CRITICAL VALUE STICKER  CRITICAL VALUE: Bilirubin 4.7  RECEIVER (on-site recipient of call): Terri Skains, LPN  DATE & TIME NOTIFIED:  11/23/21 0933 MESSENGER (representative from lab): [place] B. Percell Miller MD NOTIFIED:  Elby Showers. Thomas  TIME OF NOTIFICATION: [phone]  RESPONSE:  The provider is aware.

## 2021-11-23 NOTE — Telephone Encounter (Signed)
[name] gave verbal understanding and wanted Lisa,Np to give her a call. Mrs. Knoles is draining his peritoneal catheter once a day. She stated NP at IR told her to drained it once a day. No signs of infection just have a cough.

## 2021-11-23 NOTE — Telephone Encounter (Signed)
I returned Matthew Osborne's wife's call.  We discussed the lab results from today.  She understands the electrolyte abnormalities are most likely related to frequent drainage of the peritoneal catheter.  We discussed the recommended dose of Kayexalate which she plans to proceed with.  Her concern is the leakage around the peritoneal catheter.  We reviewed our recommendation to limit drainage of the catheter to when his abdomen is distended/tense rather than based on the leakage.  [name] is scheduled to have repeat labs done 11/26/2021.

## 2021-11-23 NOTE — Telephone Encounter (Signed)
-----   Message from Owens Shark, NP sent at 11/23/2021 11:03 AM EDT ----- Please let wife know regarding lab abnormalities (bilirubin, potassium, sodium).  I discussed the labs with Dr. Benay Spice.  Recommendation is for a dose of Kayexalate 30 g which I am sending to [facility].  Please confirm that she is not draining his peritoneal catheter.  He will need a repeat chemistry panel on 11/26/2021.  Also white count is elevated, any signs of infection?

## 2021-11-26 ENCOUNTER — Other Ambulatory Visit: Payer: Self-pay

## 2021-11-26 ENCOUNTER — Other Ambulatory Visit (HOSPITAL_BASED_OUTPATIENT_CLINIC_OR_DEPARTMENT_OTHER): Payer: Self-pay

## 2021-11-26 ENCOUNTER — Telehealth: Payer: Self-pay

## 2021-11-26 ENCOUNTER — Telehealth: Payer: Self-pay | Admitting: Nurse Practitioner

## 2021-11-26 ENCOUNTER — Inpatient Hospital Stay: Payer: No Typology Code available for payment source

## 2021-11-26 ENCOUNTER — Inpatient Hospital Stay (HOSPITAL_COMMUNITY)
Admission: EM | Admit: 2021-11-26 | Discharge: 2021-12-03 | DRG: 441 | Disposition: A | Discharge status: Hospice | Payer: No Typology Code available for payment source | Attending: Family Medicine | Admitting: Family Medicine

## 2021-11-26 ENCOUNTER — Encounter (HOSPITAL_COMMUNITY): Payer: Self-pay | Admitting: Emergency Medicine

## 2021-11-26 DIAGNOSIS — E860 Dehydration: Secondary | ICD-10-CM | POA: Diagnosis present

## 2021-11-26 DIAGNOSIS — E119 Type 2 diabetes mellitus without complications: Secondary | ICD-10-CM

## 2021-11-26 DIAGNOSIS — Z888 Allergy status to other drugs, medicaments and biological substances status: Secondary | ICD-10-CM

## 2021-11-26 DIAGNOSIS — K2210 Ulcer of esophagus without bleeding: Secondary | ICD-10-CM | POA: Diagnosis present

## 2021-11-26 DIAGNOSIS — Z801 Family history of malignant neoplasm of trachea, bronchus and lung: Secondary | ICD-10-CM

## 2021-11-26 DIAGNOSIS — Z7984 Long term (current) use of oral hypoglycemic drugs: Secondary | ICD-10-CM

## 2021-11-26 DIAGNOSIS — K7682 Hepatic encephalopathy: Principal | ICD-10-CM | POA: Diagnosis present

## 2021-11-26 DIAGNOSIS — M549 Dorsalgia, unspecified: Secondary | ICD-10-CM | POA: Diagnosis present

## 2021-11-26 DIAGNOSIS — E871 Hypo-osmolality and hyponatremia: Secondary | ICD-10-CM | POA: Diagnosis present

## 2021-11-26 DIAGNOSIS — E8770 Fluid overload, unspecified: Secondary | ICD-10-CM | POA: Diagnosis present

## 2021-11-26 DIAGNOSIS — Z8616 Personal history of COVID-19: Secondary | ICD-10-CM

## 2021-11-26 DIAGNOSIS — E872 Acidosis, unspecified: Secondary | ICD-10-CM | POA: Diagnosis present

## 2021-11-26 DIAGNOSIS — R059 Cough, unspecified: Secondary | ICD-10-CM | POA: Diagnosis present

## 2021-11-26 DIAGNOSIS — C221 Intrahepatic bile duct carcinoma: Secondary | ICD-10-CM

## 2021-11-26 DIAGNOSIS — D84821 Immunodeficiency due to drugs: Secondary | ICD-10-CM | POA: Diagnosis present

## 2021-11-26 DIAGNOSIS — B3781 Candidal esophagitis: Secondary | ICD-10-CM | POA: Diagnosis present

## 2021-11-26 DIAGNOSIS — I10 Essential (primary) hypertension: Secondary | ICD-10-CM | POA: Diagnosis present

## 2021-11-26 DIAGNOSIS — C787 Secondary malignant neoplasm of liver and intrahepatic bile duct: Secondary | ICD-10-CM | POA: Diagnosis present

## 2021-11-26 DIAGNOSIS — D638 Anemia in other chronic diseases classified elsewhere: Secondary | ICD-10-CM | POA: Diagnosis present

## 2021-11-26 DIAGNOSIS — N179 Acute kidney failure, unspecified: Secondary | ICD-10-CM | POA: Diagnosis present

## 2021-11-26 DIAGNOSIS — R64 Cachexia: Secondary | ICD-10-CM | POA: Diagnosis present

## 2021-11-26 DIAGNOSIS — K3189 Other diseases of stomach and duodenum: Secondary | ICD-10-CM | POA: Diagnosis present

## 2021-11-26 DIAGNOSIS — G9340 Encephalopathy, unspecified: Secondary | ICD-10-CM | POA: Diagnosis present

## 2021-11-26 DIAGNOSIS — D6959 Other secondary thrombocytopenia: Secondary | ICD-10-CM | POA: Diagnosis present

## 2021-11-26 DIAGNOSIS — E8809 Other disorders of plasma-protein metabolism, not elsewhere classified: Secondary | ICD-10-CM | POA: Diagnosis present

## 2021-11-26 DIAGNOSIS — Z825 Family history of asthma and other chronic lower respiratory diseases: Secondary | ICD-10-CM

## 2021-11-26 DIAGNOSIS — E876 Hypokalemia: Secondary | ICD-10-CM | POA: Diagnosis present

## 2021-11-26 DIAGNOSIS — T451X5A Adverse effect of antineoplastic and immunosuppressive drugs, initial encounter: Secondary | ICD-10-CM | POA: Diagnosis present

## 2021-11-26 DIAGNOSIS — E861 Hypovolemia: Secondary | ICD-10-CM | POA: Diagnosis present

## 2021-11-26 DIAGNOSIS — D72829 Elevated white blood cell count, unspecified: Secondary | ICD-10-CM | POA: Diagnosis present

## 2021-11-26 DIAGNOSIS — R180 Malignant ascites: Secondary | ICD-10-CM | POA: Diagnosis present

## 2021-11-26 DIAGNOSIS — Z807 Family history of other malignant neoplasms of lymphoid, hematopoietic and related tissues: Secondary | ICD-10-CM

## 2021-11-26 DIAGNOSIS — K7290 Hepatic failure, unspecified without coma: Secondary | ICD-10-CM | POA: Diagnosis present

## 2021-11-26 DIAGNOSIS — E113299 Type 2 diabetes mellitus with mild nonproliferative diabetic retinopathy without macular edema, unspecified eye: Secondary | ICD-10-CM | POA: Diagnosis present

## 2021-11-26 DIAGNOSIS — I8500 Esophageal varices without bleeding: Secondary | ICD-10-CM | POA: Diagnosis present

## 2021-11-26 DIAGNOSIS — B370 Candidal stomatitis: Secondary | ICD-10-CM | POA: Diagnosis present

## 2021-11-26 DIAGNOSIS — E43 Unspecified severe protein-calorie malnutrition: Secondary | ICD-10-CM | POA: Diagnosis present

## 2021-11-26 DIAGNOSIS — Z9641 Presence of insulin pump (external) (internal): Secondary | ICD-10-CM | POA: Diagnosis present

## 2021-11-26 DIAGNOSIS — Z794 Long term (current) use of insulin: Secondary | ICD-10-CM

## 2021-11-26 DIAGNOSIS — Z6824 Body mass index (BMI) 24.0-24.9, adult: Secondary | ICD-10-CM

## 2021-11-26 DIAGNOSIS — D6481 Anemia due to antineoplastic chemotherapy: Secondary | ICD-10-CM | POA: Diagnosis present

## 2021-11-26 DIAGNOSIS — Z8249 Family history of ischemic heart disease and other diseases of the circulatory system: Secondary | ICD-10-CM

## 2021-11-26 DIAGNOSIS — Z8261 Family history of arthritis: Secondary | ICD-10-CM

## 2021-11-26 DIAGNOSIS — K2900 Acute gastritis without bleeding: Secondary | ICD-10-CM | POA: Diagnosis present

## 2021-11-26 DIAGNOSIS — E1142 Type 2 diabetes mellitus with diabetic polyneuropathy: Secondary | ICD-10-CM | POA: Diagnosis present

## 2021-11-26 DIAGNOSIS — K766 Portal hypertension: Secondary | ICD-10-CM | POA: Diagnosis present

## 2021-11-26 DIAGNOSIS — R627 Adult failure to thrive: Secondary | ICD-10-CM | POA: Diagnosis present

## 2021-11-26 DIAGNOSIS — Z79899 Other long term (current) drug therapy: Secondary | ICD-10-CM

## 2021-11-26 DIAGNOSIS — Z87891 Personal history of nicotine dependence: Secondary | ICD-10-CM

## 2021-11-26 DIAGNOSIS — Z803 Family history of malignant neoplasm of breast: Secondary | ICD-10-CM

## 2021-11-26 DIAGNOSIS — G9341 Metabolic encephalopathy: Secondary | ICD-10-CM | POA: Diagnosis present

## 2021-11-26 DIAGNOSIS — E785 Hyperlipidemia, unspecified: Secondary | ICD-10-CM | POA: Diagnosis present

## 2021-11-26 DIAGNOSIS — X58XXXA Exposure to other specified factors, initial encounter: Secondary | ICD-10-CM | POA: Diagnosis present

## 2021-11-26 HISTORY — DX: Intrahepatic bile duct carcinoma: C22.1

## 2021-11-26 LAB — CBC WITH DIFFERENTIAL (CANCER CENTER ONLY)
Abs Immature Granulocytes: 0.18 10*3/uL — ABNORMAL HIGH (ref 0.00–0.07)
Basophils Absolute: 0.1 10*3/uL (ref 0.0–0.1)
Basophils Relative: 1 %
Eosinophils Absolute: 0.1 10*3/uL (ref 0.0–0.5)
Eosinophils Relative: 1 %
HCT: 30.8 % — ABNORMAL LOW (ref 39.0–52.0)
Hemoglobin: 11.1 g/dL — ABNORMAL LOW (ref 13.0–17.0)
Immature Granulocytes: 1 %
Lymphocytes Relative: 6 %
Lymphs Abs: 1 10*3/uL (ref 0.7–4.0)
MCH: 34.8 pg — ABNORMAL HIGH (ref 26.0–34.0)
MCHC: 36 g/dL (ref 30.0–36.0)
MCV: 96.6 fL (ref 80.0–100.0)
Monocytes Absolute: 1.2 10*3/uL — ABNORMAL HIGH (ref 0.1–1.0)
Monocytes Relative: 8 %
Neutro Abs: 12.4 10*3/uL — ABNORMAL HIGH (ref 1.7–7.7)
Neutrophils Relative %: 83 %
Platelet Count: 155 10*3/uL (ref 150–400)
RBC: 3.19 MIL/uL — ABNORMAL LOW (ref 4.22–5.81)
RDW: 19.8 % — ABNORMAL HIGH (ref 11.5–15.5)
WBC Count: 15 10*3/uL — ABNORMAL HIGH (ref 4.0–10.5)
nRBC: 0 % (ref 0.0–0.2)

## 2021-11-26 LAB — PROTIME-INR
INR: 1.6 — ABNORMAL HIGH (ref 0.8–1.2)
Prothrombin Time: 18.5 seconds — ABNORMAL HIGH (ref 11.4–15.2)

## 2021-11-26 LAB — CBC WITH DIFFERENTIAL/PLATELET
Abs Immature Granulocytes: 0.28 10*3/uL — ABNORMAL HIGH (ref 0.00–0.07)
Basophils Absolute: 0.2 10*3/uL — ABNORMAL HIGH (ref 0.0–0.1)
Basophils Relative: 1 %
Eosinophils Absolute: 0.1 10*3/uL (ref 0.0–0.5)
Eosinophils Relative: 0 %
HCT: 31.1 % — ABNORMAL LOW (ref 39.0–52.0)
Hemoglobin: 11.3 g/dL — ABNORMAL LOW (ref 13.0–17.0)
Immature Granulocytes: 2 %
Lymphocytes Relative: 5 %
Lymphs Abs: 0.9 10*3/uL (ref 0.7–4.0)
MCH: 35.5 pg — ABNORMAL HIGH (ref 26.0–34.0)
MCHC: 36.3 g/dL — ABNORMAL HIGH (ref 30.0–36.0)
MCV: 97.8 fL (ref 80.0–100.0)
Monocytes Absolute: 1.4 10*3/uL — ABNORMAL HIGH (ref 0.1–1.0)
Monocytes Relative: 7 %
Neutro Abs: 15.7 10*3/uL — ABNORMAL HIGH (ref 1.7–7.7)
Neutrophils Relative %: 85 %
Platelets: 174 10*3/uL (ref 150–400)
RBC: 3.18 MIL/uL — ABNORMAL LOW (ref 4.22–5.81)
RDW: 19.7 % — ABNORMAL HIGH (ref 11.5–15.5)
WBC: 18.5 10*3/uL — ABNORMAL HIGH (ref 4.0–10.5)
nRBC: 0 % (ref 0.0–0.2)

## 2021-11-26 LAB — CMP (CANCER CENTER ONLY)
ALT: 41 U/L (ref 0–44)
AST: 121 U/L — ABNORMAL HIGH (ref 15–41)
Albumin: 2.2 g/dL — ABNORMAL LOW (ref 3.5–5.0)
Alkaline Phosphatase: 458 U/L — ABNORMAL HIGH (ref 38–126)
Anion gap: 7 (ref 5–15)
BUN: 32 mg/dL — ABNORMAL HIGH (ref 6–20)
CO2: 26 mmol/L (ref 22–32)
Calcium: 7.9 mg/dL — ABNORMAL LOW (ref 8.9–10.3)
Chloride: 85 mmol/L — ABNORMAL LOW (ref 98–111)
Creatinine: 0.86 mg/dL (ref 0.61–1.24)
GFR, Estimated: >60 mL/min (ref >60)
Glucose, Bld: 221 mg/dL — ABNORMAL HIGH (ref 70–99)
Potassium: 4.7 mmol/L (ref 3.5–5.1)
Sodium: 118 mmol/L — CL (ref 135–145)
Total Bilirubin: 4.4 mg/dL (ref 0.3–1.2)
Total Protein: 5.5 g/dL — ABNORMAL LOW (ref 6.5–8.1)

## 2021-11-26 NOTE — Telephone Encounter (Addendum)
CRITICAL VALUE STICKER  CRITICAL VALUE: Bilirubin 4.4 and Sodium 118  RECEIVER (on-site recipient of call): Jerene Canny  DATE & TIME NOTIFIED: 11/26/2021 1555  MESSENGER (representative from lab):Sunni  MD NOTIFIED: Benay Spice  TIME OF NOTIFICATION:11/26/2021 1557  RESPONSE: Benay Spice is aware of the lab result. Patient is schedule to come in to see the provider on 11/26/21.

## 2021-11-26 NOTE — ED Triage Notes (Signed)
Patient from home, with emesis and low sodium.  Sodium was 118 today.  Had labs drawn earlier today and has been vomiting since getting home.  Patient arrives dry heaving, family states that he was vomiting bile and mucous earlier.  Patient did fall going to the bathroom, and family states that he is altered, repetitive speech, confusion.

## 2021-11-26 NOTE — Telephone Encounter (Signed)
I contacted Mr. Pickler's wife regarding lab results from today.  Potassium is better, bilirubin stable.  Sodium is lower.  He is likely dehydrated.  She has not drained the peritoneal catheter since we spoke 11/23/2021.  He continues to have leakage around the catheter.  He is not nauseated but vomits after most solid intake.  He is tolerating liquids.  He will come to the office tomorrow for an appointment and IV fluids.

## 2021-11-26 NOTE — ED Provider Notes (Signed)
[facility] EMERGENCY DEPARTMENT Provider Note   CSN: [id number] Arrival date & time: 11/26/21  2252     History  Chief Complaint  Patient presents with   Emesis    Matthew Osborne is a 46 y.o. male.  Patient presents to the emergency department for evaluation of nausea and vomiting.  Patient has stage IV cholangiocarcinoma.  He was recently hospitalized for significant electrolyte abnormalities.  He was discharged after 5 days in the hospital feeling better.  He was weak but able to ambulate with a walker.  Since discharge he has become progressively more weak and now is very confused.  He has been having nausea and vomiting through the evening, cannot hold anything down.  This has been an intermittent problem recently.  During his hospital stay he was diagnosed with Candida esophagitis.  He has been able to drink ensures but not eat solid foods recently but today cannot hold down liquids.       Home Medications Prior to Admission medications   Medication Sig Start Date End Date Taking? Authorizing Provider  blood glucose meter kit and supplies KIT Fasting prior to each meal three times a day 08/25/19   Ishmael Holter A, FNP  Blood Glucose Monitoring Suppl (FREESTYLE LITE) w/Device KIT Use as directed. 11/06/21     Continuous Blood Gluc Sensor (DEXCOM G6 SENSOR) MISC Use as directed to check blood sugar 5-6 times daily. 08/20/21   Susy Frizzle, MD  Continuous Blood Gluc Transmit (DEXCOM G6 TRANSMITTER) MISC USE AS DIRECTED TO CHECK BLOOD SUGAR 5-6 TIMES DAILY *needs office visit* 11/05/21 11/05/22  Susy Frizzle, MD  dicyclomine (BENTYL) 10 MG/5ML solution Take 5 mLs (10 mg total) by mouth 3 (three) times daily as needed (abdominal pain). 11/19/21   Dwyane Dee, MD  fluconazole (DIFLUCAN) 200 MG tablet Take 2 tablets (400 mg total) by mouth daily for 23 days. 11/20/21 12/13/21  Dwyane Dee, MD  FLUoxetine (PROZAC) 20 MG capsule Take 1 capsule (20 mg total)  by mouth daily. 09/18/21   Susy Frizzle, MD  Glucagon (BAQSIMI TWO PACK) 3 MG/DOSE POWD as directed Nasally Once a day PRN 30 days Patient not taking: Reported on 11/05/2021 05/15/21     glucose blood test strip Dispense based on patient and insurance preference.  Use twice daily as directed. (FOR ICD-10 E11.65) 08/29/17   Susy Frizzle, MD  insulin degludec (TRESIBA FLEXTOUCH) 100 UNIT/ML FlexTouch Pen Inject 6 units under the skin once daily. 11/06/21     Insulin Disposable Pump (OMNIPOD 5 G6 POD, GEN 5,) MISC Use as directed every 48 hours 09/07/21     insulin lispro (HUMALOG) 100 UNIT/ML injection Inject 200 units/day under the skin via pump 05/15/21     Insulin Pen Needle (UNIFINE PENTIPS) 32G X 4 MM MISC Use as directed 11/06/21     Insulin Syringe 27G X 1/2" 0.5 ML MISC Use as directed to inject insulin SQ Q1D. 01/31/20   Susy Frizzle, MD  Lancets Thin MISC Check BS BID 08/29/17   Susy Frizzle, MD  lidocaine-prilocaine (EMLA) cream APPLY 1 APPLICATION TOPICALLY AS DIRECTED. APPLY TO PORT SITE 1-2 HOURS PRIOR TO STICK AND COVER WITH PLASTIC WRAP. 07/03/21 07/03/22  Ladell Pier, MD  loratadine (CLARITIN) 10 MG tablet Take 10 mg by mouth as needed for allergies.    [provider]  LORazepam (ATIVAN) 0.5 MG tablet Take 1 tablet (0.5 mg total) by mouth at bedtime as needed for  anxiety. 11/05/21   Ladell Pier, MD  magic mouthwash (nystatin, lidocaine, diphenhydrAMINE, alum & mag hydroxide) suspension Take 10 mLs by mouth every 4 (four) hours as needed for mouth pain. 11/19/21   Dwyane Dee, MD  oxyCODONE (OXY IR/ROXICODONE) 5 MG immediate release tablet Take 1 tablet (5 mg total) by mouth every 6 (six) hours as needed for severe pain. 11/19/21   Dwyane Dee, MD  pantoprazole (PROTONIX) 40 MG tablet Take 1 tablet (40 mg total) by mouth daily. 11/19/21   Dwyane Dee, MD  prochlorperazine (COMPAZINE) 10 MG tablet TAKE 1 TABLET BY MOUTH EVERY 6 HOURS AS NEEDED FOR  NAUSEA 10/09/21 10/09/22  Ladell Pier, MD  Senna (SENOKOT LAXATIVE GUMMIES PO) Take 2 tablets by mouth daily at 12 noon.    [provider]  sucralfate (CARAFATE) 1 g tablet Take 1 tablet (1 g total) by mouth 4 (four) times daily -  with meals and at bedtime. 11/19/21   Dwyane Dee, MD  TRUEPLUS LANCETS 33G MISC 1 each by Does not apply route 2 (two) times daily. 07/06/13   Susy Frizzle, MD      Allergies    Niaspan [niacin]    Review of Systems   Review of Systems  Physical Exam Updated Vital Signs BP 112/74   Pulse 87   Temp (!) 97.3 F (36.3 C) (Oral)   Resp 17   SpO2 99%  Physical Exam Vitals and nursing note reviewed.  Constitutional:      Appearance: He is ill-appearing.  HENT:     Head: Atraumatic.  Eyes:     General: Scleral icterus present.     Pupils: Pupils are equal, round, and reactive to light.  Cardiovascular:     Rate and Rhythm: Normal rate and regular rhythm.  Pulmonary:     Effort: Pulmonary effort is normal.     Breath sounds: Normal breath sounds.  Abdominal:     General: Abdomen is flat.     Comments: Pleur-x in place  Musculoskeletal:        General: Normal range of motion.  Skin:    General: Skin is warm.  Neurological:     General: No focal deficit present.     ED Results / Procedures / Treatments   Labs (all labs ordered are listed, but only abnormal results are displayed) Labs Reviewed  CBC WITH DIFFERENTIAL/PLATELET - Abnormal; Notable for the following components:      Result Value   WBC 18.5 (*)    RBC 3.18 (*)    Hemoglobin 11.3 (*)    HCT 31.1 (*)    MCH 35.5 (*)    MCHC 36.3 (*)    RDW 19.7 (*)    Neutro Abs 15.7 (*)    Monocytes Absolute 1.4 (*)    Basophils Absolute 0.2 (*)    Abs Immature Granulocytes 0.28 (*)    All other components within normal limits  BASIC METABOLIC PANEL - Abnormal; Notable for the following components:   Sodium 120 (*)    Chloride 86 (*)    Glucose, Bld 139 (*)    BUN 33 (*)     Calcium 7.8 (*)    All other components within normal limits  HEPATIC FUNCTION PANEL - Abnormal; Notable for the following components:   Total Protein 6.0 (*)    Albumin 1.9 (*)    AST 147 (*)    ALT 48 (*)    Alkaline Phosphatase 445 (*)    Total  Bilirubin 4.7 (*)    Bilirubin, Direct 2.4 (*)    Indirect Bilirubin 2.3 (*)    All other components within normal limits  LIPASE, BLOOD - Abnormal; Notable for the following components:   Lipase 68 (*)    All other components within normal limits  LACTIC ACID, PLASMA - Abnormal; Notable for the following components:   Lactic Acid, Venous 4.1 (*)    All other components within normal limits  AMMONIA - Abnormal; Notable for the following components:   Ammonia 169 (*)    All other components within normal limits  PROTIME-INR - Abnormal; Notable for the following components:   Prothrombin Time 18.5 (*)    INR 1.6 (*)    All other components within normal limits  MAGNESIUM  URINALYSIS, ROUTINE W REFLEX MICROSCOPIC    EKG None ED ECG REPORT   Date: 11/27/2021  Rate: 84   Rhythm: normal sinus rhythm  QRS Axis: normal  Intervals: normal  ST/T Wave abnormalities: nonspecific T wave changes  Conduction Disutrbances:none  Narrative Interpretation:   Old EKG Reviewed: unchanged  I have personally reviewed the EKG tracing and agree with the computerized printout as noted.   Radiology DG Chest Port 1 View  Result Date: 11/27/2021 CLINICAL DATA:  Vomiting, can not swallow, MS changes. EXAM: PORTABLE CHEST 1 VIEW COMPARISON:  11/23/2021. FINDINGS: The heart size and mediastinal contours are within normal limits. Lung volumes are low. No consolidation, effusion, or pneumothorax. A right chest port appear stable. No acute osseous abnormality. IMPRESSION: Low lung volumes with no active disease. Electronically Signed   By: Brett Fairy M.D.   On: 11/27/2021 00:43    Procedures Procedures    Medications Ordered in ED Medications -  No data to display  ED Course/ Medical Decision Making/ A&P                           Medical Decision Making Amount and/or Complexity of Data Reviewed Independent Historian: spouse External Data Reviewed: labs, ECG and notes. Labs: ordered. Decision-making details documented in ED Course. Radiology: ordered and independent interpretation performed. Decision-making details documented in ED Course. ECG/medicine tests: ordered and independent interpretation performed. Decision-making details documented in ED Course.   Patient was brought to the emergency department for evaluation of progressively worsening generalized weakness with altered mental status.  Patient has a history of stage IV cholangiocarcinoma.  He has had worsening liver function secondary to his cancer.  Patient has a Pleurx catheter and placed for periodic drainage of his ascites.  Since this was placed he has had recurrent issues with electrolyte problems.  Patient has been having difficulty with oral intake secondary to vomiting.  He was recently treated for candidal esophagitis.  He has not had an EGD although it was considered at last hospitalization.  Broad differential diagnosis was considered including sepsis, esophageal obstruction, small bowel obstruction, metabolic derangement, hepatic encephalopathy.  Lab work was ordered at arrival.  Patient's LFTs are about at baseline.  Of note, pro time is slowly increasing, now 1.6.  He does have a leukocytosis but no clear infectious symptoms and no fever.  He has had problems with hyperkalemia in the past but his potassium is within normal limits currently.  He does have hyponatremia which has been a recurrent problem.  He does not look significantly dehydrated at this time, would likely third space additional IV fluids.  Ammonia is markedly elevated which is likely the cause of  his mental status changes.  Will require hospitalization for further management of hepatic  encephalopathy.  CRITICAL CARE Performed by: Orpah Greek   Total critical care time: 30 minutes  Critical care time was exclusive of separately billable procedures and treating other patients.  Critical care was necessary to treat or prevent imminent or life-threatening deterioration.  Critical care was time spent personally by me on the following activities: development of treatment plan with patient and/or surrogate as well as nursing, discussions with consultants, evaluation of patient's response to treatment, examination of patient, obtaining history from patient or surrogate, ordering and performing treatments and interventions, ordering and review of laboratory studies, ordering and review of radiographic studies, pulse oximetry and re-evaluation of patient's condition.         Final Clinical Impression(s) / ED Diagnoses Final diagnoses:  Hyponatremia  Hepatic encephalopathy ([place])    Rx / DC Orders ED Discharge Orders     None         Nickolette Espinola, Gwenyth Allegra, MD 11/27/21 [phone]

## 2021-11-27 ENCOUNTER — Encounter (HOSPITAL_COMMUNITY): Payer: Self-pay | Admitting: Internal Medicine

## 2021-11-27 ENCOUNTER — Inpatient Hospital Stay: Payer: No Typology Code available for payment source | Admitting: Oncology

## 2021-11-27 ENCOUNTER — Emergency Department (HOSPITAL_COMMUNITY): Payer: No Typology Code available for payment source

## 2021-11-27 DIAGNOSIS — E1142 Type 2 diabetes mellitus with diabetic polyneuropathy: Secondary | ICD-10-CM | POA: Diagnosis present

## 2021-11-27 DIAGNOSIS — B3781 Candidal esophagitis: Secondary | ICD-10-CM | POA: Diagnosis present

## 2021-11-27 DIAGNOSIS — G9341 Metabolic encephalopathy: Secondary | ICD-10-CM | POA: Diagnosis present

## 2021-11-27 DIAGNOSIS — R180 Malignant ascites: Secondary | ICD-10-CM | POA: Diagnosis present

## 2021-11-27 DIAGNOSIS — D6959 Other secondary thrombocytopenia: Secondary | ICD-10-CM | POA: Diagnosis present

## 2021-11-27 DIAGNOSIS — Z794 Long term (current) use of insulin: Secondary | ICD-10-CM

## 2021-11-27 DIAGNOSIS — K7290 Hepatic failure, unspecified without coma: Secondary | ICD-10-CM

## 2021-11-27 DIAGNOSIS — N179 Acute kidney failure, unspecified: Secondary | ICD-10-CM | POA: Diagnosis present

## 2021-11-27 DIAGNOSIS — Z87891 Personal history of nicotine dependence: Secondary | ICD-10-CM | POA: Diagnosis not present

## 2021-11-27 DIAGNOSIS — E43 Unspecified severe protein-calorie malnutrition: Secondary | ICD-10-CM | POA: Diagnosis present

## 2021-11-27 DIAGNOSIS — G9340 Encephalopathy, unspecified: Secondary | ICD-10-CM | POA: Diagnosis present

## 2021-11-27 DIAGNOSIS — C787 Secondary malignant neoplasm of liver and intrahepatic bile duct: Secondary | ICD-10-CM | POA: Diagnosis present

## 2021-11-27 DIAGNOSIS — B370 Candidal stomatitis: Secondary | ICD-10-CM | POA: Diagnosis present

## 2021-11-27 DIAGNOSIS — D638 Anemia in other chronic diseases classified elsewhere: Secondary | ICD-10-CM | POA: Diagnosis present

## 2021-11-27 DIAGNOSIS — C221 Intrahepatic bile duct carcinoma: Secondary | ICD-10-CM | POA: Diagnosis not present

## 2021-11-27 DIAGNOSIS — K7682 Hepatic encephalopathy: Secondary | ICD-10-CM | POA: Diagnosis present

## 2021-11-27 DIAGNOSIS — R627 Adult failure to thrive: Secondary | ICD-10-CM | POA: Diagnosis present

## 2021-11-27 DIAGNOSIS — E871 Hypo-osmolality and hyponatremia: Secondary | ICD-10-CM | POA: Diagnosis present

## 2021-11-27 DIAGNOSIS — K766 Portal hypertension: Secondary | ICD-10-CM | POA: Diagnosis present

## 2021-11-27 DIAGNOSIS — E1165 Type 2 diabetes mellitus with hyperglycemia: Secondary | ICD-10-CM | POA: Diagnosis not present

## 2021-11-27 DIAGNOSIS — K2900 Acute gastritis without bleeding: Secondary | ICD-10-CM | POA: Diagnosis not present

## 2021-11-27 DIAGNOSIS — X58XXXA Exposure to other specified factors, initial encounter: Secondary | ICD-10-CM | POA: Diagnosis present

## 2021-11-27 DIAGNOSIS — K2210 Ulcer of esophagus without bleeding: Secondary | ICD-10-CM | POA: Diagnosis present

## 2021-11-27 DIAGNOSIS — E872 Acidosis, unspecified: Secondary | ICD-10-CM

## 2021-11-27 DIAGNOSIS — R64 Cachexia: Secondary | ICD-10-CM | POA: Diagnosis present

## 2021-11-27 DIAGNOSIS — K3189 Other diseases of stomach and duodenum: Secondary | ICD-10-CM | POA: Diagnosis not present

## 2021-11-27 DIAGNOSIS — I8510 Secondary esophageal varices without bleeding: Secondary | ICD-10-CM | POA: Diagnosis not present

## 2021-11-27 DIAGNOSIS — D84821 Immunodeficiency due to drugs: Secondary | ICD-10-CM | POA: Diagnosis present

## 2021-11-27 DIAGNOSIS — Z8616 Personal history of COVID-19: Secondary | ICD-10-CM | POA: Diagnosis not present

## 2021-11-27 DIAGNOSIS — I8500 Esophageal varices without bleeding: Secondary | ICD-10-CM | POA: Diagnosis present

## 2021-11-27 DIAGNOSIS — K2101 Gastro-esophageal reflux disease with esophagitis, with bleeding: Secondary | ICD-10-CM | POA: Diagnosis not present

## 2021-11-27 DIAGNOSIS — D72829 Elevated white blood cell count, unspecified: Secondary | ICD-10-CM

## 2021-11-27 DIAGNOSIS — E113299 Type 2 diabetes mellitus with mild nonproliferative diabetic retinopathy without macular edema, unspecified eye: Secondary | ICD-10-CM | POA: Diagnosis present

## 2021-11-27 DIAGNOSIS — I10 Essential (primary) hypertension: Secondary | ICD-10-CM | POA: Diagnosis present

## 2021-11-27 LAB — CBC WITH DIFFERENTIAL/PLATELET
Abs Immature Granulocytes: 0.17 10*3/uL — ABNORMAL HIGH (ref 0.00–0.07)
Basophils Absolute: 0.1 10*3/uL (ref 0.0–0.1)
Basophils Relative: 1 %
Eosinophils Absolute: 0.1 10*3/uL (ref 0.0–0.5)
Eosinophils Relative: 1 %
HCT: 27.6 % — ABNORMAL LOW (ref 39.0–52.0)
Hemoglobin: 10.1 g/dL — ABNORMAL LOW (ref 13.0–17.0)
Immature Granulocytes: 1 %
Lymphocytes Relative: 9 %
Lymphs Abs: 1.3 10*3/uL (ref 0.7–4.0)
MCH: 35.8 pg — ABNORMAL HIGH (ref 26.0–34.0)
MCHC: 36.6 g/dL — ABNORMAL HIGH (ref 30.0–36.0)
MCV: 97.9 fL (ref 80.0–100.0)
Monocytes Absolute: 1.2 10*3/uL — ABNORMAL HIGH (ref 0.1–1.0)
Monocytes Relative: 9 %
Neutro Abs: 11.3 10*3/uL — ABNORMAL HIGH (ref 1.7–7.7)
Neutrophils Relative %: 79 %
Platelets: 147 10*3/uL — ABNORMAL LOW (ref 150–400)
RBC: 2.82 MIL/uL — ABNORMAL LOW (ref 4.22–5.81)
RDW: 19.9 % — ABNORMAL HIGH (ref 11.5–15.5)
WBC: 14.1 10*3/uL — ABNORMAL HIGH (ref 4.0–10.5)
nRBC: 0 % (ref 0.0–0.2)

## 2021-11-27 LAB — BASIC METABOLIC PANEL
Anion gap: 12 (ref 5–15)
BUN: 33 mg/dL — ABNORMAL HIGH (ref 6–20)
CO2: 22 mmol/L (ref 22–32)
Calcium: 7.8 mg/dL — ABNORMAL LOW (ref 8.9–10.3)
Chloride: 86 mmol/L — ABNORMAL LOW (ref 98–111)
Creatinine, Ser: 1.1 mg/dL (ref 0.61–1.24)
GFR, Estimated: >60 mL/min (ref >60)
Glucose, Bld: 139 mg/dL — ABNORMAL HIGH (ref 70–99)
Potassium: 4.8 mmol/L (ref 3.5–5.1)
Sodium: 120 mmol/L — ABNORMAL LOW (ref 135–145)

## 2021-11-27 LAB — COMPREHENSIVE METABOLIC PANEL
ALT: 43 U/L (ref 0–44)
AST: 136 U/L — ABNORMAL HIGH (ref 15–41)
Albumin: 1.7 g/dL — ABNORMAL LOW (ref 3.5–5.0)
Alkaline Phosphatase: 375 U/L — ABNORMAL HIGH (ref 38–126)
Anion gap: 11 (ref 5–15)
BUN: 33 mg/dL — ABNORMAL HIGH (ref 6–20)
CO2: 24 mmol/L (ref 22–32)
Calcium: 8 mg/dL — ABNORMAL LOW (ref 8.9–10.3)
Chloride: 86 mmol/L — ABNORMAL LOW (ref 98–111)
Creatinine, Ser: 1.16 mg/dL (ref 0.61–1.24)
GFR, Estimated: >60 mL/min (ref >60)
Glucose, Bld: 111 mg/dL — ABNORMAL HIGH (ref 70–99)
Potassium: 4.7 mmol/L (ref 3.5–5.1)
Sodium: 121 mmol/L — ABNORMAL LOW (ref 135–145)
Total Bilirubin: 5.2 mg/dL — ABNORMAL HIGH (ref 0.3–1.2)
Total Protein: 5.4 g/dL — ABNORMAL LOW (ref 6.5–8.1)

## 2021-11-27 LAB — GLUCOSE, CAPILLARY: Glucose-Capillary: 289 mg/dL — ABNORMAL HIGH (ref 70–99)

## 2021-11-27 LAB — CBG MONITORING, ED
Glucose-Capillary: 110 mg/dL — ABNORMAL HIGH (ref 70–99)
Glucose-Capillary: 162 mg/dL — ABNORMAL HIGH (ref 70–99)
Glucose-Capillary: 201 mg/dL — ABNORMAL HIGH (ref 70–99)
Glucose-Capillary: 377 mg/dL — ABNORMAL HIGH (ref 70–99)

## 2021-11-27 LAB — URINALYSIS, ROUTINE W REFLEX MICROSCOPIC
Bilirubin Urine: NEGATIVE
Glucose, UA: NEGATIVE mg/dL
Hgb urine dipstick: NEGATIVE
Ketones, ur: NEGATIVE mg/dL
Leukocytes,Ua: NEGATIVE
Nitrite: NEGATIVE
Protein, ur: NEGATIVE mg/dL
Specific Gravity, Urine: 1.02 (ref 1.005–1.030)
pH: 5 (ref 5.0–8.0)

## 2021-11-27 LAB — CK: Total CK: 27 U/L — ABNORMAL LOW (ref 49–397)

## 2021-11-27 LAB — SODIUM
Sodium: 123 mmol/L — ABNORMAL LOW (ref 135–145)
Sodium: 121 mmol/L — ABNORMAL LOW (ref 135–145)

## 2021-11-27 LAB — LACTIC ACID, PLASMA
Lactic Acid, Venous: 4.1 mmol/L (ref 0.5–1.9)
Lactic Acid, Venous: 2.9 mmol/L (ref 0.5–1.9)

## 2021-11-27 LAB — I-STAT VENOUS BLOOD GAS, ED
Acid-Base Excess: 3 mmol/L — ABNORMAL HIGH (ref 0.0–2.0)
Bicarbonate: 24.9 mmol/L (ref 20.0–28.0)
Calcium, Ion: 1.06 mmol/L — ABNORMAL LOW (ref 1.15–1.40)
HCT: 30 % — ABNORMAL LOW (ref 39.0–52.0)
Hemoglobin: 10.2 g/dL — ABNORMAL LOW (ref 13.0–17.0)
O2 Saturation: 88 %
Potassium: 4.7 mmol/L (ref 3.5–5.1)
Sodium: 120 mmol/L — ABNORMAL LOW (ref 135–145)
TCO2: 26 mmol/L (ref 22–32)
pCO2, Ven: 29.8 mmHg — ABNORMAL LOW (ref 44–60)
pH, Ven: 7.529 — ABNORMAL HIGH (ref 7.25–7.43)
pO2, Ven: 48 mmHg — ABNORMAL HIGH (ref 32–45)

## 2021-11-27 LAB — AMMONIA
Ammonia: 169 umol/L — ABNORMAL HIGH (ref 9–35)
Ammonia: 65 umol/L — ABNORMAL HIGH (ref 9–35)

## 2021-11-27 LAB — PROTIME-INR
INR: 1.6 — ABNORMAL HIGH (ref 0.8–1.2)
Prothrombin Time: 18.7 seconds — ABNORMAL HIGH (ref 11.4–15.2)

## 2021-11-27 LAB — PROCALCITONIN: Procalcitonin: 0.82 ng/mL

## 2021-11-27 LAB — OSMOLALITY, URINE: Osmolality, Ur: 635 mOsm/kg (ref 300–900)

## 2021-11-27 LAB — MAGNESIUM
Magnesium: 2.1 mg/dL (ref 1.7–2.4)
Magnesium: 2 mg/dL (ref 1.7–2.4)

## 2021-11-27 LAB — CANCER ANTIGEN 19-9: CA 19-9: 522 U/mL — ABNORMAL HIGH (ref 0–35)

## 2021-11-27 LAB — HEPATIC FUNCTION PANEL
ALT: 48 U/L — ABNORMAL HIGH (ref 0–44)
AST: 147 U/L — ABNORMAL HIGH (ref 15–41)
Albumin: 1.9 g/dL — ABNORMAL LOW (ref 3.5–5.0)
Alkaline Phosphatase: 445 U/L — ABNORMAL HIGH (ref 38–126)
Bilirubin, Direct: 2.4 mg/dL — ABNORMAL HIGH (ref 0.0–0.2)
Indirect Bilirubin: 2.3 mg/dL — ABNORMAL HIGH (ref 0.3–0.9)
Total Bilirubin: 4.7 mg/dL — ABNORMAL HIGH (ref 0.3–1.2)
Total Protein: 6 g/dL — ABNORMAL LOW (ref 6.5–8.1)

## 2021-11-27 LAB — SODIUM, URINE, RANDOM: Sodium, Ur: <10 mmol/L

## 2021-11-27 LAB — TSH: TSH: 1.665 u[IU]/mL (ref 0.350–4.500)

## 2021-11-27 LAB — OSMOLALITY: Osmolality: 263 mOsm/kg — ABNORMAL LOW (ref 275–295)

## 2021-11-27 LAB — LIPASE, BLOOD: Lipase: 68 U/L — ABNORMAL HIGH (ref 11–51)

## 2021-11-27 LAB — CREATININE, URINE, RANDOM: Creatinine, Urine: 120 mg/dL

## 2021-11-27 MED ORDER — SODIUM CHLORIDE 0.9 % IV SOLN: INTRAVENOUS | Status: DC

## 2021-11-27 MED ORDER — FLUCONAZOLE 100 MG PO TABS
400.0000 mg | ORAL_TABLET | Freq: Every day | ORAL | Status: DC
  Administered 2021-11-27 – 2021-12-03 (×7): 400 mg via ORAL
  Filled 2021-11-27 (×5): qty 4
  Filled 2021-11-27: qty 2
  Filled 2021-11-27 (×2): qty 4

## 2021-11-27 MED ORDER — ALBUMIN HUMAN 25 % IV SOLN
25.0000 g | Freq: Once | INTRAVENOUS | Status: AC
  Administered 2021-11-27: 25 g via INTRAVENOUS
  Filled 2021-11-27: qty 100

## 2021-11-27 MED ORDER — LACTATED RINGERS IV SOLN: INTRAVENOUS | Status: DC

## 2021-11-27 MED ORDER — INSULIN ASPART 100 UNIT/ML IJ SOLN
0.0000 [IU] | Freq: Four times a day (QID) | INTRAMUSCULAR | Status: DC
  Administered 2021-11-27: 2 [IU] via SUBCUTANEOUS

## 2021-11-27 MED ORDER — BOOST / RESOURCE BREEZE PO LIQD CUSTOM
1.0000 | Freq: Three times a day (TID) | ORAL | Status: DC
  Administered 2021-11-27 (×2): 1 via ORAL
  Filled 2021-11-27: qty 1

## 2021-11-27 MED ORDER — ACETAMINOPHEN 650 MG RE SUPP: 650.0000 mg | Freq: Four times a day (QID) | RECTAL | Status: DC | PRN

## 2021-11-27 MED ORDER — BOOST / RESOURCE BREEZE PO LIQD CUSTOM
1.0000 | Freq: Three times a day (TID) | ORAL | Status: DC
  Filled 2021-11-27: qty 1

## 2021-11-27 MED ORDER — LACTULOSE ENEMA
300.0000 mL | Freq: Three times a day (TID) | ORAL | Status: DC
  Filled 2021-11-27: qty 300

## 2021-11-27 MED ORDER — PANTOPRAZOLE SODIUM 40 MG IV SOLR
40.0000 mg | Freq: Every day | INTRAVENOUS | Status: DC
  Administered 2021-11-27: 40 mg via INTRAVENOUS
  Filled 2021-11-27: qty 10

## 2021-11-27 MED ORDER — ACETAMINOPHEN 325 MG PO TABS: 650.0000 mg | ORAL_TABLET | Freq: Four times a day (QID) | ORAL | Status: DC | PRN

## 2021-11-27 MED ORDER — MAGIC MOUTHWASH W/LIDOCAINE
5.0000 mL | Freq: Four times a day (QID) | ORAL | Status: DC | PRN
  Administered 2021-11-29 – 2021-12-03 (×10): 5 mL via ORAL
  Filled 2021-11-27 (×16): qty 5

## 2021-11-27 MED ORDER — LORAZEPAM 2 MG/ML IJ SOLN
0.5000 mg | Freq: Three times a day (TID) | INTRAMUSCULAR | Status: DC | PRN
  Administered 2021-11-27 – 2021-11-29 (×4): 0.5 mg via INTRAVENOUS
  Filled 2021-11-27 (×4): qty 1

## 2021-11-27 MED ORDER — LACTULOSE 10 GM/15ML PO SOLN
30.0000 g | Freq: Three times a day (TID) | ORAL | Status: DC
  Administered 2021-11-27 – 2021-11-29 (×8): 30 g via ORAL
  Filled 2021-11-27 (×9): qty 45

## 2021-11-27 MED ORDER — CHLORHEXIDINE GLUCONATE CLOTH 2 % EX PADS
6.0000 | MEDICATED_PAD | Freq: Every day | CUTANEOUS | Status: DC
  Administered 2021-11-27 – 2021-12-03 (×3): 6 via TOPICAL

## 2021-11-27 MED ORDER — INSULIN ASPART 100 UNIT/ML IJ SOLN
0.0000 [IU] | Freq: Three times a day (TID) | INTRAMUSCULAR | Status: DC
  Administered 2021-11-27: 5 [IU] via SUBCUTANEOUS
  Administered 2021-11-28: 2 [IU] via SUBCUTANEOUS

## 2021-11-27 MED ORDER — INSULIN ASPART 100 UNIT/ML IJ SOLN
0.0000 [IU] | Freq: Every day | INTRAMUSCULAR | Status: DC
  Administered 2021-11-27: 3 [IU] via SUBCUTANEOUS

## 2021-11-27 MED ORDER — MORPHINE SULFATE (PF) 2 MG/ML IV SOLN
1.0000 mg | INTRAVENOUS | Status: DC | PRN
  Administered 2021-12-01: 1 mg via INTRAVENOUS
  Filled 2021-11-27: qty 1

## 2021-11-27 MED ORDER — ONDANSETRON HCL 4 MG/2ML IJ SOLN
4.0000 mg | Freq: Four times a day (QID) | INTRAMUSCULAR | Status: DC | PRN
  Administered 2021-11-27 – 2021-12-01 (×5): 4 mg via INTRAVENOUS
  Filled 2021-11-27 (×5): qty 2

## 2021-11-27 NOTE — Progress Notes (Signed)
Patient seen and examined. Pt was admitted this AM. H and P reviewed  Briefly, pt with hx stage 4 cholangiocarcinoma with mets to liver, chronic hyponatremia, DM presented with mental status change. Pt was recently discharged from [facility] for hyponatremia and dysphagia, presumed to be secondary to candidal esophagitis. At present, pt is clinically dehydrated with urine Na <10. Complains of continued dysphagia despite course of fluconazole prior to this visit.  Vital signs reviewed. BP overall stable, HR in the low 100's, o2 sat 100% Generally confused, membranes dry  Toxic metabolic encephalopathy -suspect secondary to dehydration and profound hyponatremia -initially continued on LR at 75cc.  -Changed to NS at 125cc/hr based on calculated free-water deficit -follow serial sodium trends  Hypovolemic hyponatremia -Per above, now on NS at 125cc/hr -Follow sodium trends  Dysphagia -no improvement despite course of fluconazole -Consulted GI. Plan for EGD tomorrow -If evidence of candidiasis, then plan IV diflucan  DM2 -Cont on SSI coverage as needed  Stage 4 cholangiocarcinoma -Followed by oncology  ARF -Now on IVF per above -Recheck bmet in AM  Author: Marylu Lund, MD [facility]

## 2021-11-27 NOTE — ED Notes (Signed)
This RN called to give a verbal report to Ellie Lunch who reports she would like to wait to send the patient upstairs due to getting another new admission. This RN to provide medication then place patient on the tele tracking list

## 2021-11-27 NOTE — H&P (Signed)
History and Physical    PLEASE NOTE THAT DRAGON DICTATION SOFTWARE WAS USED IN THE CONSTRUCTION OF THIS NOTE.   Matthew Osborne VWP:[id number] DOB: 1975/10/20 DOA: 11/26/2021  PCP: Susy Frizzle, MD  Patient coming from: home   I have personally briefly reviewed patient's old medical records in [place]  Chief Complaint: Altered mental status  HPI: Matthew Osborne is a 46 y.o. male with medical history significant for stage IV cholangiocarcinoma with metastatic disease to the liver resulting in liver failure, chronic hyponatremia with baseline serum sodium of 128- 130, type 2 diabetes mellitus, who is admitted to [facility] on 11/26/2021 with acute encephalopathy after presenting from home to [facility] ED for evaluation of altered mental status.  In the setting of the patient's altered mental status, the following history is provided by the patient's wife, in addition to my discussions with the EDP and via chart review.  The patient is recently hospitalized at [facility] from 11/15/2021 to 11/19/2021 for a diet aphasia, with ensuing diagnosis of esophageal candidiasis, for which he was started on fluconazole on 11/18/2021.  On the day of admission at [facility] on 11/15/2021, serum sodium level noted to be 119, relative to his reported baseline of 1 28-1 30.  This subsequent improved with IV fluids, back to baseline, with final serum sodium level of 128 noted on day of discharge to home from [facility] on approximately 3 more weeks of fluconazole for esophageal candidiasis.  However, over the last will for days, the patient has exhibited increasing somnolence.  In setting if this increases somnolence, he is exhibited diminished oral intake over the last several days.  No reported interval trauma.  Regarding his stage IV cholangiocarcinoma, he follows with Dr.**Of oncology, undergoing chemotherapy.  In the setting of his liver failure as consequence of metastatic disease from his  glandular carcinoma, he developed portal hypertension, recurrent ascites requiring frequent therapeutic paracenteses.  Ultimately, he underwent placement of Pleurx catheter on 10/31/2021.  Per chart review, recent liver enzymes were notable for the following, which were checked on 11/19/2021: Alkaline phosphatase 356, AST 112, ALT 43, total Ruben 5.0.  Per chart review, no prior ammonia level on file.  Additionally, most recent prior and INR found to be 1.3 on 10/31/2021.  Per chart review, serum sodium levels following discharge from [facility] on 11/19/2021 notable for the following: Sodium 128 on 11/19/2021, 124 on 11/23/2021, 118 at the cancer center earlier on 11/26/2021.   Patient presented for routine follow-up at [place] 11/26/2021, in the setting of the patient's increasing somnolence, was instructed to present to the emergency department for further evaluation and management thereof.       ED Course:  Vital signs in the ED were notable for the following: Afebrile; heart rate 37-48; systolic blood pressures in the 110-120s range; respiratory rate 15-17, oxygen saturation 99 to 100% on room air.  Labs were notable for the following: CMP notable for the following: Sodium 120, potassium 4.8, chloride 86, creatinine 1.10 compared to most recent prior value of 0.80 on 11/23/2021, glucose 139, calcium, adjusted for mild hypoalbuminemia noted to be 9.4, albumin 1.9, alkaline phosphatase 445, AST 147, ALT 48, total Ruben 4.7, of which 2.3 with indirect.  Ammonia level 169.  Initial lactate 4.1.  CBC notable for white cell count 18,500 relative to 9.3 on 11/19/2021.  INR 1.6.  Urinalysis ordered, with result currently pending.  Imaging and additional notable ED work-up: EKG shows sinus rhythm  with heart rate 84, normal intervals, and no evidence of T wave or ST changes, including no evidence of ST elevation.  Chest x-ray shows low lung volumes, without evidence of acute process, including  evidence of infiltrate, edema, effusion, or pneumothorax.  While in the ED, the following were administered: None  Subsequently, the patient was admitted for further evaluation management of acute encephalopathy, suspected to be hepatic in nature, with presenting labs also notable for lactic acidosis, acute on chronic hyponatremia, acute kidney injury.    Review of Systems: As per HPI otherwise 10 point review of systems negative.   Past Medical History:  Diagnosis Date   Cancer ([place])    Cholangiocarcinoma ([place])    stage 4; mets to liver   De Quervain's tenosynovitis, left 03/2011   Diabetes mellitus    NIDDM   Family history of breast cancer    Family history of Hodgkin's lymphoma    Hyperlipidemia    Hypertension    under control; has been on med. x 4 yrs.   Nonproliferative retinopathy due to secondary diabetes [facility])     Past Surgical History:  Procedure Laterality Date   CARPAL TUNNEL RELEASE     bilat.   DORSAL COMPARTMENT RELEASE  03/14/2011   Procedure: RELEASE DORSAL COMPARTMENT (DEQUERVAIN);  Surgeon: Paulene Floor, MD;  Location: [place];  Service: Orthopedics;  Laterality: Left;  left wrist APL and EPL tenosynovectomy and 1st dorsal compartment release   IR IMAGE GUIDED DRAINAGE PERCUT CATH  PERITONEAL RETROPERIT  10/31/2021   IR IMAGING GUIDED PORT INSERTION  02/01/2020   IR PARACENTESIS  09/10/2021   IR PARACENTESIS  09/21/2021   IR PARACENTESIS  09/27/2021   IR PARACENTESIS  10/03/2021   IR PARACENTESIS  10/12/2021   IR PARACENTESIS  10/19/2021   IR PARACENTESIS  10/25/2021   IR PARACENTESIS  11/06/2021   TONSILLECTOMY AND ADENOIDECTOMY  as a child   VASECTOMY      Social History:  reports that he quit smoking about 22 years ago. His smoking use included cigarettes. He has a 18.00 pack-year smoking history. He has never used smokeless tobacco. He reports that he does not drink alcohol and does not use drugs.   Allergies  Allergen Reactions    Niaspan [Niacin] Other (See Comments)    Flushing     Family History  Problem Relation Age of Onset   Cancer Other    Hyperlipidemia Other    Hypertension Brother    Asthma Son    Lung cancer Maternal Grandfather    Mesothelioma Maternal Grandfather        asbestos exposure   Rheum arthritis Maternal Grandmother    Breast cancer Maternal Grandmother 44   Hodgkin's lymphoma Mother 53       30 lb tumor removed from abdomen   Cirrhosis Paternal Grandmother    Other Maternal Great-grandfather        black lung, worked in [place] (MGF's father)    Family history reviewed and not pertinent    Prior to Admission medications   Medication Sig Start Date End Date Taking? Authorizing Provider  blood glucose meter kit and supplies KIT Fasting prior to each meal three times a day 08/25/19   Ishmael Holter A, FNP  Blood Glucose Monitoring Suppl (FREESTYLE LITE) w/Device KIT Use as directed. 11/06/21     Continuous Blood Gluc Sensor (DEXCOM G6 SENSOR) MISC Use as directed to check blood sugar 5-6 times daily. 08/20/21  Susy Frizzle, MD  Continuous Blood Gluc Transmit (DEXCOM G6 TRANSMITTER) MISC USE AS DIRECTED TO CHECK BLOOD SUGAR 5-6 TIMES DAILY *needs office visit* 11/05/21 11/05/22  Susy Frizzle, MD  dicyclomine (BENTYL) 10 MG/5ML solution Take 5 mLs (10 mg total) by mouth 3 (three) times daily as needed (abdominal pain). 11/19/21   Dwyane Dee, MD  fluconazole (DIFLUCAN) 200 MG tablet Take 2 tablets (400 mg total) by mouth daily for 23 days. 11/20/21 12/13/21  Dwyane Dee, MD  FLUoxetine (PROZAC) 20 MG capsule Take 1 capsule (20 mg total) by mouth daily. 09/18/21   Susy Frizzle, MD  Glucagon (BAQSIMI TWO PACK) 3 MG/DOSE POWD as directed Nasally Once a day PRN 30 days Patient not taking: Reported on 11/05/2021 05/15/21     glucose blood test strip Dispense based on patient and insurance preference.  Use twice daily as directed. (FOR ICD-10 E11.65) 08/29/17   Susy Frizzle, MD  insulin degludec (TRESIBA FLEXTOUCH) 100 UNIT/ML FlexTouch Pen Inject 6 units under the skin once daily. 11/06/21     Insulin Disposable Pump (OMNIPOD 5 G6 POD, GEN 5,) MISC Use as directed every 48 hours 09/07/21     insulin lispro (HUMALOG) 100 UNIT/ML injection Inject 200 units/day under the skin via pump 05/15/21     Insulin Pen Needle (UNIFINE PENTIPS) 32G X 4 MM MISC Use as directed 11/06/21     Insulin Syringe 27G X 1/2" 0.5 ML MISC Use as directed to inject insulin SQ Q1D. 01/31/20   Susy Frizzle, MD  Lancets Thin MISC Check BS BID 08/29/17   Susy Frizzle, MD  lidocaine-prilocaine (EMLA) cream APPLY 1 APPLICATION TOPICALLY AS DIRECTED. APPLY TO PORT SITE 1-2 HOURS PRIOR TO STICK AND COVER WITH PLASTIC WRAP. 07/03/21 07/03/22  Ladell Pier, MD  loratadine (CLARITIN) 10 MG tablet Take 10 mg by mouth as needed for allergies.    [provider]  LORazepam (ATIVAN) 0.5 MG tablet Take 1 tablet (0.5 mg total) by mouth at bedtime as needed for anxiety. 11/05/21   Ladell Pier, MD  magic mouthwash (nystatin, lidocaine, diphenhydrAMINE, alum & mag hydroxide) suspension Take 10 mLs by mouth every 4 (four) hours as needed for mouth pain. 11/19/21   Dwyane Dee, MD  oxyCODONE (OXY IR/ROXICODONE) 5 MG immediate release tablet Take 1 tablet (5 mg total) by mouth every 6 (six) hours as needed for severe pain. 11/19/21   Dwyane Dee, MD  pantoprazole (PROTONIX) 40 MG tablet Take 1 tablet (40 mg total) by mouth daily. 11/19/21   Dwyane Dee, MD  prochlorperazine (COMPAZINE) 10 MG tablet TAKE 1 TABLET BY MOUTH EVERY 6 HOURS AS NEEDED FOR NAUSEA 10/09/21 10/09/22  Ladell Pier, MD  Senna (SENOKOT LAXATIVE GUMMIES PO) Take 2 tablets by mouth daily at 12 noon.    [provider]  sucralfate (CARAFATE) 1 g tablet Take 1 tablet (1 g total) by mouth 4 (four) times daily -  with meals and at bedtime. 11/19/21   Dwyane Dee, MD  TRUEPLUS LANCETS 33G MISC 1 each by Does not  apply route 2 (two) times daily. 07/06/13   Susy Frizzle, MD     Objective    Physical Exam: Vitals:   11/27/21 0000 11/27/21 0030 11/27/21 0100 11/27/21 0130  BP: 112/74 117/74 114/75 116/76  Pulse: 87 82 82 82  Resp: 17 15 16 16   Temp:      TempSrc:      SpO2: 99% 99% 99%  99%    General: appears to be stated age; somnolent, will briefly open eyes to stimuli.  Skin: warm, dry, no rash Head:  AT/[place] Mouth:  Oral mucosa membranes appear dry, normal dentition Neck: supple; trachea midline Heart:  RRR; did not appreciate any M/R/G Lungs: CTAB, did not appreciate any wheezes, rales, or rhonchi Abdomen: + BS; soft, ND, NT Vascular: 2+ pedal pulses b/l; 2+ radial pulses b/l Extremities: no peripheral edema, no muscle wasting Neuro: In the setting of the patient's current mental status and associated inability to follow instructions, unable to perform full neurologic exam at this time.  As such, assessment of strength, sensation, and cranial nerves is limited at this time. Patient noted to spontaneously move all 4 extremities. No tremors.    Labs on Admission: I have personally reviewed following labs and imaging studies  CBC: Recent Labs  Lab 11/23/21 0856 11/26/21 1452 11/26/21 2320  WBC 17.3* 15.0* 18.5*  NEUTROABS 13.3* 12.4* 15.7*  HGB 11.3* 11.1* 11.3*  HCT 31.7* 30.8* 31.1*  MCV 97.2 96.6 97.8  PLT 148* 155 710   Basic Metabolic Panel: Recent Labs  Lab 11/23/21 0856 11/26/21 1452 11/26/21 2320  NA 124* 118* 120*  K 5.4* 4.7 4.8  CL 92* 85* 86*  CO2 28 26 22   GLUCOSE 144* 221* 139*  BUN 27* 32* 33*  CREATININE 0.80 0.86 1.10  CALCIUM 8.4* 7.9* 7.8*  MG  --   --  2.1   GFR: Estimated Creatinine Clearance: 97.6 mL/min (by C-G formula based on SCr of 1.1 mg/dL). Liver Function Tests: Recent Labs  Lab 11/23/21 0856 11/26/21 1452 11/26/21 2320  AST 119* 121* 147*  ALT 41 41 48*  ALKPHOS 468* 458* 445*  BILITOT 4.7* 4.4* 4.7*  PROT 5.2* 5.5* 6.0*   ALBUMIN 2.5* 2.2* 1.9*   Recent Labs  Lab 11/26/21 2320  LIPASE 68*   Recent Labs  Lab 11/26/21 2320  AMMONIA 169*   Coagulation Profile: Recent Labs  Lab 11/26/21 2320  INR 1.6*   Cardiac Enzymes: No results for input(s): "CKTOTAL", "CKMB", "CKMBINDEX", "TROPONINI" in the last 168 hours. BNP (last 3 results) No results for input(s): "PROBNP" in the last 8760 hours. HbA1C: No results for input(s): "HGBA1C" in the last 72 hours. CBG: No results for input(s): "GLUCAP" in the last 168 hours. Lipid Profile: No results for input(s): "CHOL", "HDL", "LDLCALC", "TRIG", "CHOLHDL", "LDLDIRECT" in the last 72 hours. Thyroid Function Tests: No results for input(s): "TSH", "T4TOTAL", "FREET4", "T3FREE", "THYROIDAB" in the last 72 hours. Anemia Panel: No results for input(s): "VITAMINB12", "FOLATE", "FERRITIN", "TIBC", "IRON", "RETICCTPCT" in the last 72 hours. Urine analysis:    Component Value Date/Time   COLORURINE ORANGE (A) 10/10/2021 1155   APPEARANCEUR HAZY (A) 10/10/2021 1155   LABSPEC 1.031 (H) 10/10/2021 1155   PHURINE 5.5 10/10/2021 1155   GLUCOSEU NEGATIVE 10/10/2021 1155   HGBUR NEGATIVE 10/10/2021 1155   BILIRUBINUR NEGATIVE 10/10/2021 1155   KETONESUR TRACE (A) 10/10/2021 1155   PROTEINUR 30 (A) 10/10/2021 1155   UROBILINOGEN 0.2 10/06/2013 1214   NITRITE NEGATIVE 10/10/2021 1155   LEUKOCYTESUR NEGATIVE 10/10/2021 1155    Radiological Exams on Admission: DG Chest Port 1 View  Result Date: 11/27/2021 CLINICAL DATA:  Vomiting, can not swallow, MS changes. EXAM: PORTABLE CHEST 1 VIEW COMPARISON:  11/23/2021. FINDINGS: The heart size and mediastinal contours are within normal limits. Lung volumes are low. No consolidation, effusion, or pneumothorax. A right chest port appear stable. No acute osseous abnormality. IMPRESSION: Low  lung volumes with no active disease. Electronically Signed   By: Brett Fairy M.D.   On: 11/27/2021 00:43     EKG: Independently  reviewed, with result as described above.    Assessment/Plan   Principal Problem:   Acute encephalopathy Active Problems:   Acute hyponatremia   AKI (acute kidney injury) ([place])   DM2 (diabetes mellitus, type 2) ([facility])   Lactic acidosis   Leukocytosis   Liver failure ([place])     #) Acute metabolic encephalopathy: Progressive somnolence following discharge from hospital 1 week ago.  Will otherwise a diagnosis of exclusion, suspect attribution from hepatic encephalopathy given elevated ammonia level, as quantified above, albeit without any prior mania values of reported comparison /  trending.  However, this appears to coincide with potential response to interval initiation of fluconazole, while also noting suggestion of decline in hepatic synthetic function, with INR now up to 1.6 relative to most recent prior value of 1.3.  Potential additional metabolic contributions include acute on chronic hyponatremia, acute kidney injury, with potential for pharmacologic contribution from prn oxycodone, with diminished renal metabolism of such in the setting of interval acute kidney injury.  Of note, no overt evidence of underlying infectious process at this time, although urinalysis is currently pending.  Chest x-ray shows no evidence of acute process.  Mildly elevated white blood cell count is noted, however presenting the only SIRS criteria present at this time.  Suspect a contribution from dehydration resulting in this elevated white blood cell count.  Patient had increased risk for SBP, but overall, presentation appears to be less suggestive of underlying infection.  We will closely monitor ensuing clinical trend to determine necessity for pursuit of diagnostic evaluation of peritoneal labs.  While pursuing additional evaluation of the patient's presenting acute encephalopathy, as further detailed below, will also initiate lactulose for suspected given benefit of hepatic encephalopathy.  In terms of options  for administration of lactulose given the patient's altered mental status, and inability to tolerate p.o. at this time will initiate lactulose retention enemas.  Refraining from placement of NG tube at this time given history of liver failure, without recent EGD to evaluate for the presence of varices.  Plan: Lactulose enemas, as above.  Repeat CMP/CBC in the morning.  Follow-up result urinalysis.  Repeat INR in the morning.  Check CPK level, TSH, VBG.  Gentle IV fluids in the form of lactated Ringer's at 75 cc/h x 10 hours.  Monitor strict I's and O's and O weights.  Further evaluation management of acute on chronic hyponatremia, as further detailed below.            #) Lactic acidosis: Initial lactate noted to be 4.1.  As described above, criteria for sepsis not met at this time, with still SIRS criteria present at this time noted to be mildly elevated white blood cell count, with suspected contribution from dehydration.  Additionally, no overt evidence of underlying infection.  Therefore, once again, criteria for sepsis not currently met.  Elevated lactate likely multifactorial in nature, with contributions from underlying metastatic disease, liver failure, interval development of acute kidney injury, dehydration.  Plan: General interval IV fluids, as further described above.  Repeat lactate ordered with morning labs.  Follow-up result urinalysis.  Repeat INR in the morning.  Further evaluation management of AKI, as further detailed below.  Repeat CMP/CBC in the morning.  Add on CPK level.          #) Acute on chronic hypoosmolar hyponatremia: In  setting of a documented history of chronic hyponatremia with baseline serum sodium of 1 28-1 30, presenting sodium noted to be 120, low suspicion for hypovolemic component given report of interval decline in oral intake.  Had similar serum sodium level at time of recent inspissation at [facility], which reportedly improved with IV fluids.  As  the patient does appear intravascularly dry at this time, will initiate gentle IV fluids and closely monitor ensuing serum sodium trend as outlined below.  We will also initiate further evaluation for any contribution from SIADH.  Home use of Prozac is noted.   Plan: Lactated Ringer's at 75 cc/h x 10 hours, as above.  Monitor strict I's and O's and daily weights.  Add on random urine sodium, random urine creatinine, random urine osmolality.  Check serum osmolality to confirm suspected hypoosmolar process.  Check TSH.  CMP in the morning as well as once timed BMP ordered to be checked at 10 AM on 11/27/2021.  Hold home Prozac for now.  Follow-up result urinalysis.             #) Acute Kidney Injury:  as quantified above.  Suspect prerenal contribution, likely from dehydration given interval decline in oral intake in the setting of progressive somnolence.  Appears to be consistent with finding of acute prerenal azotemia on presenting labs.  Potential pharmacologic exacerbation from home prn oxycodone, with its nephrotoxic implications in the setting of worsening creatinine clearance associated with this AKI.  Suspect the differential favors dehydration at this time as opposed to hepatorenal process.   Plan: monitor strict I's & O's and daily weights. Attempt to avoid nephrotoxic agents.  Hold home oxycodone for now.  Refrain from NSAIDs. Repeat CMP in the morning. Check serum magnesium level. Add-on random urine sodium and random urine creatinine.  Follow-up result of urinalysis with microscopy.  Check CPK level. if renal function does not improve with the above measures, can consider obtaining abdominal imaging to assess for evidence of post-renal obstructive process.            #(Type 2 diabetes mellitus: Documented history of such, on basal insulin in the form of Tresiba 16 6 subcu daily.  Presenting blood sugar 139.  Plan: We will hold him basal insulin for now.  Accu-Cheks every 6  hours with low-dose sliding scale insulin.  Hold home insulin pump for now in order to reduce risk for hypoglycemia in the setting of presenting acute encephalopathy.           #) Liver failure: Nonalcoholic in nature, on the basis of metastatic disease from stage IV cholangiocarcinoma.  Slight interval increase in degree of transaminitis following discharge from hospital on fluconazole, as a potential contributory factor.  Of note, total bilirubin remains stable, while noting interval increase in INR, now up to 1.6 relative to most recent prior value as further quantified above.  Complicated by portal hypertension with recurrent ascites status post placement of Pleurx catheter on 10/31/2021.  MELD score at the time of this evening's presentation noted to be 27, a/w , associated with estimated 66-monthmortality of 19.6.  Of note, in the setting of his history of portal hypertension, with recurrent ascites, does not appear that he is on any diuretic medications neither Lasix nor spironolactone.   Plan: Initiation of lactulose, as above.  Repeat INR in the morning.  Repeat CMP in the morning.  Monitor strict I's and O's and daily weights.  Ultimate consideration for initiation of spironolactone/Lasix.           #)  Esophageal candidiasis: Diagnosed with this during most recent prior hospitalization after presenting with complaint of a dyne aphasia.  Has not yet undergone EGD.  Is also on scheduled Carafate as well as Protonix.  In the setting of interval worsening of transaminitis as well as concern for worsening liver function, will hold fluconazole for now.  Plan: Holding off consult for now, as above.  In setting of current n.p.o. status due to presenting somnolence, will hold Carafate for now.  Protonix 40 mg IV daily.         DVT prophylaxis: SCD's   Code Status: Full code Family Communication: case d\w patient's wife Disposition Plan: Per Rounding Team Consults called: none;   Admission status: inpatient    PLEASE NOTE THAT DRAGON DICTATION SOFTWARE WAS USED IN THE CONSTRUCTION OF THIS NOTE.   [place] DO [facility]  From [place]   11/27/2021, 2:52 AM

## 2021-11-27 NOTE — ED Notes (Addendum)
This RN asks for requested medications at this time through RX feature

## 2021-11-27 NOTE — Consult Note (Signed)
Referring Provider: Dr. Wyline Copas Primary Care Physician:  Susy Frizzle, MD Primary Gastroenterologist:  Althia Forts  Reason for Consultation:  Odynophagia; Feeding Difficulties  HPI: Matthew Osborne is a 46 y.o. male with metastatic cholangiocarcinoma to the liver on chemo (has not had radiation therapy) recently admitted last week with hyponatremia and thought to have esophageal candidiasis. Odynophagia improved with Fluconazole and EGD not done since he was improving symptomatically. Odynophagia worsened as an outpt and could not tolerate any solid food and only could tolerate Ensure without vomiting until today when could not keep that down. Pain with swallowing other consistencies as well as vomiting. Abdominal pain intermittently. Has had increased somnolence for the last several days. Denies rectal bleeding or black stools. Daughter's best friend and patient's son help with the history.  Past Medical History:  Diagnosis Date   Cancer ([place])    Cholangiocarcinoma ([place])    stage 4; mets to liver   De Quervain's tenosynovitis, left 03/2011   Diabetes mellitus    NIDDM   Family history of breast cancer    Family history of Hodgkin's lymphoma    Hyperlipidemia    Hypertension    under control; has been on med. x 4 yrs.   Nonproliferative retinopathy due to secondary diabetes [facility])     Past Surgical History:  Procedure Laterality Date   CARPAL TUNNEL RELEASE     bilat.   DORSAL COMPARTMENT RELEASE  03/14/2011   Procedure: RELEASE DORSAL COMPARTMENT (DEQUERVAIN);  Surgeon: Paulene Floor, MD;  Location: [place];  Service: Orthopedics;  Laterality: Left;  left wrist APL and EPL tenosynovectomy and 1st dorsal compartment release   IR IMAGE GUIDED DRAINAGE PERCUT CATH  PERITONEAL RETROPERIT  10/31/2021   IR IMAGING GUIDED PORT INSERTION  02/01/2020   IR PARACENTESIS  09/10/2021   IR PARACENTESIS  09/21/2021   IR PARACENTESIS  09/27/2021   IR PARACENTESIS  10/03/2021   IR  PARACENTESIS  10/12/2021   IR PARACENTESIS  10/19/2021   IR PARACENTESIS  10/25/2021   IR PARACENTESIS  11/06/2021   TONSILLECTOMY AND ADENOIDECTOMY  as a child   VASECTOMY      Prior to Admission medications   Medication Sig Start Date End Date Taking? Authorizing Provider  blood glucose meter kit and supplies KIT Fasting prior to each meal three times a day 08/25/19   Ishmael Holter A, FNP  Blood Glucose Monitoring Suppl (FREESTYLE LITE) w/Device KIT Use as directed. 11/06/21     Continuous Blood Gluc Sensor (DEXCOM G6 SENSOR) MISC Use as directed to check blood sugar 5-6 times daily. 08/20/21   Susy Frizzle, MD  Continuous Blood Gluc Transmit (DEXCOM G6 TRANSMITTER) MISC USE AS DIRECTED TO CHECK BLOOD SUGAR 5-6 TIMES DAILY *needs office visit* 11/05/21 11/05/22  Susy Frizzle, MD  dicyclomine (BENTYL) 10 MG/5ML solution Take 5 mLs (10 mg total) by mouth 3 (three) times daily as needed (abdominal pain). 11/19/21   Dwyane Dee, MD  fluconazole (DIFLUCAN) 200 MG tablet Take 2 tablets (400 mg total) by mouth daily for 23 days. 11/20/21 12/13/21  Dwyane Dee, MD  FLUoxetine (PROZAC) 20 MG capsule Take 1 capsule (20 mg total) by mouth daily. 09/18/21   Susy Frizzle, MD  Glucagon (BAQSIMI TWO PACK) 3 MG/DOSE POWD as directed Nasally Once a day PRN 30 days Patient not taking: Reported on 11/05/2021 05/15/21     glucose blood test strip Dispense based on patient and insurance preference.  Use twice daily  as directed. (FOR ICD-10 E11.65) 08/29/17   Susy Frizzle, MD  insulin degludec (TRESIBA FLEXTOUCH) 100 UNIT/ML FlexTouch Pen Inject 6 units under the skin once daily. 11/06/21     Insulin Disposable Pump (OMNIPOD 5 G6 POD, GEN 5,) MISC Use as directed every 48 hours 09/07/21     insulin lispro (HUMALOG) 100 UNIT/ML injection Inject 200 units/day under the skin via pump 05/15/21     Insulin Pen Needle (UNIFINE PENTIPS) 32G X 4 MM MISC Use as directed 11/06/21     Insulin Syringe 27G X 1/2" 0.5  ML MISC Use as directed to inject insulin SQ Q1D. 01/31/20   Susy Frizzle, MD  Lancets Thin MISC Check BS BID 08/29/17   Susy Frizzle, MD  lidocaine-prilocaine (EMLA) cream APPLY 1 APPLICATION TOPICALLY AS DIRECTED. APPLY TO PORT SITE 1-2 HOURS PRIOR TO STICK AND COVER WITH PLASTIC WRAP. 07/03/21 07/03/22  Ladell Pier, MD  loratadine (CLARITIN) 10 MG tablet Take 10 mg by mouth as needed for allergies.    [provider]  LORazepam (ATIVAN) 0.5 MG tablet Take 1 tablet (0.5 mg total) by mouth at bedtime as needed for anxiety. 11/05/21   Ladell Pier, MD  magic mouthwash (nystatin, lidocaine, diphenhydrAMINE, alum & mag hydroxide) suspension Take 10 mLs by mouth every 4 (four) hours as needed for mouth pain. 11/19/21   Dwyane Dee, MD  oxyCODONE (OXY IR/ROXICODONE) 5 MG immediate release tablet Take 1 tablet (5 mg total) by mouth every 6 (six) hours as needed for severe pain. 11/19/21   Dwyane Dee, MD  pantoprazole (PROTONIX) 40 MG tablet Take 1 tablet (40 mg total) by mouth daily. 11/19/21   Dwyane Dee, MD  prochlorperazine (COMPAZINE) 10 MG tablet TAKE 1 TABLET BY MOUTH EVERY 6 HOURS AS NEEDED FOR NAUSEA 10/09/21 10/09/22  Ladell Pier, MD  Senna (SENOKOT LAXATIVE GUMMIES PO) Take 2 tablets by mouth daily at 12 noon.    [provider]  sucralfate (CARAFATE) 1 g tablet Take 1 tablet (1 g total) by mouth 4 (four) times daily -  with meals and at bedtime. 11/19/21   Dwyane Dee, MD  TRUEPLUS LANCETS 33G MISC 1 each by Does not apply route 2 (two) times daily. 07/06/13   Susy Frizzle, MD    Scheduled Meds:  insulin aspart  0-6 Units Subcutaneous Q6H   lactulose  30 g Oral TID   pantoprazole (PROTONIX) IV  40 mg Intravenous Daily   Continuous Infusions:  sodium chloride     albumin human     PRN Meds:.acetaminophen **OR** acetaminophen, LORazepam, magic mouthwash w/lidocaine, morphine injection, ondansetron (ZOFRAN) IV  Allergies as of 11/26/2021  - Review Complete 11/26/2021  Allergen Reaction Noted   Niaspan [niacin] Other (See Comments) 10/12/2012    Family History  Problem Relation Age of Onset   Cancer Other    Hyperlipidemia Other    Hypertension Brother    Asthma Son    Lung cancer Maternal Grandfather    Mesothelioma Maternal Grandfather        asbestos exposure   Rheum arthritis Maternal Grandmother    Breast cancer Maternal Grandmother 93   Hodgkin's lymphoma Mother 53       30 lb tumor removed from abdomen   Cirrhosis Paternal Grandmother    Other Maternal Great-grandfather        black lung, worked in [employer] (MGF's father)    Social History   Socioeconomic History   Marital status: Married  Spouse name: Not on file   Number of children: Not on file   Years of education: Not on file   Highest education level: Not on file  Occupational History   Not on file  Tobacco Use   Smoking status: Former    Packs/day: 3.00    Years: 6.00    Total pack years: 18.00    Types: Cigarettes    Quit date: 02/05/1999    Years since quitting: 22.8   Smokeless tobacco: Never  Vaping Use   Vaping Use: Never used  Substance and Sexual Activity   Alcohol use: No    Alcohol/week: 0.0 standard drinks of alcohol   Drug use: No   Sexual activity: Not on file  Other Topics Concern   Not on file  Social History Narrative   Not on file   Social Determinants of Health   Financial Resource Strain: Low Risk  (01/18/2020)   Overall Financial Resource Strain (CARDIA)    Difficulty of Paying Living Expenses: Not hard at all  Food Insecurity: No Food Insecurity (11/15/2021)   Hunger Vital Sign    Worried About Running Out of Food in the Last Year: Never true    Ran Out of Food in the Last Year: Never true  Transportation Needs: No Transportation Needs (11/15/2021)   PRAPARE - Hydrologist (Medical): No    Lack of Transportation (Non-Medical): No  Physical Activity: Not on file  Stress:  Stress Concern Present (01/18/2020)   [place]    Feeling of Stress : To some extent  Social Connections: Socially Integrated (01/18/2020)   Social Connection and Isolation Panel [NHANES]    Frequency of Communication with Friends and Family: More than three times a week    Frequency of Social Gatherings with Friends and Family: More than three times a week    Attends Religious Services: More than 4 times per year    Active Member of [employer] or Organizations: Yes    Attends Music therapist: More than 4 times per year    Marital Status: Married  Human resources officer Violence: Not At Risk (11/15/2021)   Humiliation, Afraid, Rape, and Kick questionnaire    Fear of Current or Ex-Partner: No    Emotionally Abused: No    Physically Abused: No    Sexually Abused: No    Review of Systems: All negative except as stated above in HPI.  Physical Exam: Vital signs: Vitals:   11/27/21 0727 11/27/21 0830  BP:  127/83  Pulse:  87  Resp:  17  Temp: (!) 97.2 F (36.2 C)   SpO2:  99%     General:  Lethargic, cachetic, jaundice, no acute distress  Head: normocephalic, atraumatic Eyes: +icteric sclera ENT: oropharynx clear Neck: supple, nontender Lungs:  Clear throughout to auscultation.   No wheezes, crackles, or rhonchi. No acute distress. Heart:  Regular rate and rhythm; no murmurs, clicks, rubs,  or gallops. Abdomen: diffusely tender with guarding, distended, +BS  Rectal:  Deferred Ext: no edema  GI:  Lab Results: Recent Labs    11/26/21 1452 11/26/21 2320 11/27/21 0527 11/27/21 0538  WBC 15.0* 18.5* 14.1*  --   HGB 11.1* 11.3* 10.1* 10.2*  HCT 30.8* 31.1* 27.6* 30.0*  PLT 155 174 147*  --    BMET Recent Labs    11/26/21 1452 11/26/21 2320 11/27/21 0527 11/27/21 0538  NA 118* 120* 121* 120*  K 4.7  4.8 4.7 4.7  CL 85* 86* 86*  --   CO2 _0 --   GLUCOSE 221* 139* 111*  --   BUN 32* 33*  33*  --   CREATININE 0.86 1.10 1.16  --   CALCIUM 7.9* 7.8* 8.0*  --    LFT Recent Labs    11/26/21 2320 11/27/21 0527  PROT 6.0* 5.4*  ALBUMIN 1.9* 1.7*  AST 147* 136*  ALT 48* 43  ALKPHOS 445* 375*  BILITOT 4.7* 5.2*  BILIDIR 2.4*  --   IBILI 2.3*  --    PT/INR Recent Labs    11/26/21 2320 11/27/21 0527  LABPROT 18.5* 18.7*  INR 1.6* 1.6*     Studies/Results: DG Chest Port 1 View  Result Date: 11/27/2021 CLINICAL DATA:  Vomiting, can not swallow, MS changes. EXAM: PORTABLE CHEST 1 VIEW COMPARISON:  11/23/2021. FINDINGS: The heart size and mediastinal contours are within normal limits. Lung volumes are low. No consolidation, effusion, or pneumothorax. A right chest port appear stable. No acute osseous abnormality. IMPRESSION: Low lung volumes with no active disease. Electronically Signed   By: Brett Fairy M.D.   On: 11/27/2021 00:43    Impression/Plan: Odynophagia in the setting of metastatic cholangiocarcinoma with mets to the liver. Acute encephalopathy multifactorial. Jaundice due to cholangiocarcinoma and meds likely playing a role. Suspect infectious esophagitis with his immunocompromised state on chemo. Needs EGD to further evaluate odynophagia. EGD tomorrow if serum sodium improved. Clear liquid diet. NPO p MN. If Candidiasis noted then will start IV Diflucan. Supportive care.    [name]  11/27/2021, 10:21 AM  Questions please call [phone]

## 2021-11-27 NOTE — H&P (View-Only) (Signed)
Referring Provider: Dr. Wyline Copas Primary Care Physician:  Susy Frizzle, MD Primary Gastroenterologist:  Althia Forts  Reason for Consultation:  Odynophagia; Feeding Difficulties  HPI: Matthew Osborne is a 46 y.o. male with metastatic cholangiocarcinoma to the liver on chemo (has not had radiation therapy) recently admitted last week with hyponatremia and thought to have esophageal candidiasis. Odynophagia improved with Fluconazole and EGD not done since he was improving symptomatically. Odynophagia worsened as an outpt and could not tolerate any solid food and only could tolerate Ensure without vomiting until today when could not keep that down. Pain with swallowing other consistencies as well as vomiting. Abdominal pain intermittently. Has had increased somnolence for the last several days. Denies rectal bleeding or black stools. Daughter's best friend and patient's son help with the history.  Past Medical History:  Diagnosis Date   Cancer ([place])    Cholangiocarcinoma ([place])    stage 4; mets to liver   De Quervain's tenosynovitis, left 03/2011   Diabetes mellitus    NIDDM   Family history of breast cancer    Family history of Hodgkin's lymphoma    Hyperlipidemia    Hypertension    under control; has been on med. x 4 yrs.   Nonproliferative retinopathy due to secondary diabetes [facility])     Past Surgical History:  Procedure Laterality Date   CARPAL TUNNEL RELEASE     bilat.   DORSAL COMPARTMENT RELEASE  03/14/2011   Procedure: RELEASE DORSAL COMPARTMENT (DEQUERVAIN);  Surgeon: Paulene Floor, MD;  Location: [place];  Service: Orthopedics;  Laterality: Left;  left wrist APL and EPL tenosynovectomy and 1st dorsal compartment release   IR IMAGE GUIDED DRAINAGE PERCUT CATH  PERITONEAL RETROPERIT  10/31/2021   IR IMAGING GUIDED PORT INSERTION  02/01/2020   IR PARACENTESIS  09/10/2021   IR PARACENTESIS  09/21/2021   IR PARACENTESIS  09/27/2021   IR PARACENTESIS  10/03/2021   IR  PARACENTESIS  10/12/2021   IR PARACENTESIS  10/19/2021   IR PARACENTESIS  10/25/2021   IR PARACENTESIS  11/06/2021   TONSILLECTOMY AND ADENOIDECTOMY  as a child   VASECTOMY      Prior to Admission medications   Medication Sig Start Date End Date Taking? Authorizing Provider  blood glucose meter kit and supplies KIT Fasting prior to each meal three times a day 08/25/19   Ishmael Holter A, FNP  Blood Glucose Monitoring Suppl (FREESTYLE LITE) w/Device KIT Use as directed. 11/06/21     Continuous Blood Gluc Sensor (DEXCOM G6 SENSOR) MISC Use as directed to check blood sugar 5-6 times daily. 08/20/21   Susy Frizzle, MD  Continuous Blood Gluc Transmit (DEXCOM G6 TRANSMITTER) MISC USE AS DIRECTED TO CHECK BLOOD SUGAR 5-6 TIMES DAILY *needs office visit* 11/05/21 11/05/22  Susy Frizzle, MD  dicyclomine (BENTYL) 10 MG/5ML solution Take 5 mLs (10 mg total) by mouth 3 (three) times daily as needed (abdominal pain). 11/19/21   Dwyane Dee, MD  fluconazole (DIFLUCAN) 200 MG tablet Take 2 tablets (400 mg total) by mouth daily for 23 days. 11/20/21 12/13/21  Dwyane Dee, MD  FLUoxetine (PROZAC) 20 MG capsule Take 1 capsule (20 mg total) by mouth daily. 09/18/21   Susy Frizzle, MD  Glucagon (BAQSIMI TWO PACK) 3 MG/DOSE POWD as directed Nasally Once a day PRN 30 days Patient not taking: Reported on 11/05/2021 05/15/21     glucose blood test strip Dispense based on patient and insurance preference.  Use twice daily  as directed. (FOR ICD-10 E11.65) 08/29/17   Susy Frizzle, MD  insulin degludec (TRESIBA FLEXTOUCH) 100 UNIT/ML FlexTouch Pen Inject 6 units under the skin once daily. 11/06/21     Insulin Disposable Pump (OMNIPOD 5 G6 POD, GEN 5,) MISC Use as directed every 48 hours 09/07/21     insulin lispro (HUMALOG) 100 UNIT/ML injection Inject 200 units/day under the skin via pump 05/15/21     Insulin Pen Needle (UNIFINE PENTIPS) 32G X 4 MM MISC Use as directed 11/06/21     Insulin Syringe 27G X 1/2" 0.5  ML MISC Use as directed to inject insulin SQ Q1D. 01/31/20   Susy Frizzle, MD  Lancets Thin MISC Check BS BID 08/29/17   Susy Frizzle, MD  lidocaine-prilocaine (EMLA) cream APPLY 1 APPLICATION TOPICALLY AS DIRECTED. APPLY TO PORT SITE 1-2 HOURS PRIOR TO STICK AND COVER WITH PLASTIC WRAP. 07/03/21 07/03/22  Ladell Pier, MD  loratadine (CLARITIN) 10 MG tablet Take 10 mg by mouth as needed for allergies.    [provider]  LORazepam (ATIVAN) 0.5 MG tablet Take 1 tablet (0.5 mg total) by mouth at bedtime as needed for anxiety. 11/05/21   Ladell Pier, MD  magic mouthwash (nystatin, lidocaine, diphenhydrAMINE, alum & mag hydroxide) suspension Take 10 mLs by mouth every 4 (four) hours as needed for mouth pain. 11/19/21   Dwyane Dee, MD  oxyCODONE (OXY IR/ROXICODONE) 5 MG immediate release tablet Take 1 tablet (5 mg total) by mouth every 6 (six) hours as needed for severe pain. 11/19/21   Dwyane Dee, MD  pantoprazole (PROTONIX) 40 MG tablet Take 1 tablet (40 mg total) by mouth daily. 11/19/21   Dwyane Dee, MD  prochlorperazine (COMPAZINE) 10 MG tablet TAKE 1 TABLET BY MOUTH EVERY 6 HOURS AS NEEDED FOR NAUSEA 10/09/21 10/09/22  Ladell Pier, MD  Senna (SENOKOT LAXATIVE GUMMIES PO) Take 2 tablets by mouth daily at 12 noon.    [provider]  sucralfate (CARAFATE) 1 g tablet Take 1 tablet (1 g total) by mouth 4 (four) times daily -  with meals and at bedtime. 11/19/21   Dwyane Dee, MD  TRUEPLUS LANCETS 33G MISC 1 each by Does not apply route 2 (two) times daily. 07/06/13   Susy Frizzle, MD    Scheduled Meds:  insulin aspart  0-6 Units Subcutaneous Q6H   lactulose  30 g Oral TID   pantoprazole (PROTONIX) IV  40 mg Intravenous Daily   Continuous Infusions:  sodium chloride     albumin human     PRN Meds:.acetaminophen **OR** acetaminophen, LORazepam, magic mouthwash w/lidocaine, morphine injection, ondansetron (ZOFRAN) IV  Allergies as of 11/26/2021  - Review Complete 11/26/2021  Allergen Reaction Noted   Niaspan [niacin] Other (See Comments) 10/12/2012    Family History  Problem Relation Age of Onset   Cancer Other    Hyperlipidemia Other    Hypertension Brother    Asthma Son    Lung cancer Maternal Grandfather    Mesothelioma Maternal Grandfather        asbestos exposure   Rheum arthritis Maternal Grandmother    Breast cancer Maternal Grandmother 93   Hodgkin's lymphoma Mother 53       30 lb tumor removed from abdomen   Cirrhosis Paternal Grandmother    Other Maternal Great-grandfather        black lung, worked in [employer] (MGF's father)    Social History   Socioeconomic History   Marital status: Married  Spouse name: Not on file   Number of children: Not on file   Years of education: Not on file   Highest education level: Not on file  Occupational History   Not on file  Tobacco Use   Smoking status: Former    Packs/day: 3.00    Years: 6.00    Total pack years: 18.00    Types: Cigarettes    Quit date: 02/05/1999    Years since quitting: 22.8   Smokeless tobacco: Never  Vaping Use   Vaping Use: Never used  Substance and Sexual Activity   Alcohol use: No    Alcohol/week: 0.0 standard drinks of alcohol   Drug use: No   Sexual activity: Not on file  Other Topics Concern   Not on file  Social History Narrative   Not on file   Social Determinants of Health   Financial Resource Strain: Low Risk  (01/18/2020)   Overall Financial Resource Strain (CARDIA)    Difficulty of Paying Living Expenses: Not hard at all  Food Insecurity: No Food Insecurity (11/15/2021)   Hunger Vital Sign    Worried About Running Out of Food in the Last Year: Never true    Ran Out of Food in the Last Year: Never true  Transportation Needs: No Transportation Needs (11/15/2021)   PRAPARE - Hydrologist (Medical): No    Lack of Transportation (Non-Medical): No  Physical Activity: Not on file  Stress:  Stress Concern Present (01/18/2020)   [place]    Feeling of Stress : To some extent  Social Connections: Socially Integrated (01/18/2020)   Social Connection and Isolation Panel [NHANES]    Frequency of Communication with Friends and Family: More than three times a week    Frequency of Social Gatherings with Friends and Family: More than three times a week    Attends Religious Services: More than 4 times per year    Active Member of [employer] or Organizations: Yes    Attends Music therapist: More than 4 times per year    Marital Status: Married  Human resources officer Violence: Not At Risk (11/15/2021)   Humiliation, Afraid, Rape, and Kick questionnaire    Fear of Current or Ex-Partner: No    Emotionally Abused: No    Physically Abused: No    Sexually Abused: No    Review of Systems: All negative except as stated above in HPI.  Physical Exam: Vital signs: Vitals:   11/27/21 0727 11/27/21 0830  BP:  127/83  Pulse:  87  Resp:  17  Temp: (!) 97.2 F (36.2 C)   SpO2:  99%     General:  Lethargic, cachetic, jaundice, no acute distress  Head: normocephalic, atraumatic Eyes: +icteric sclera ENT: oropharynx clear Neck: supple, nontender Lungs:  Clear throughout to auscultation.   No wheezes, crackles, or rhonchi. No acute distress. Heart:  Regular rate and rhythm; no murmurs, clicks, rubs,  or gallops. Abdomen: diffusely tender with guarding, distended, +BS  Rectal:  Deferred Ext: no edema  GI:  Lab Results: Recent Labs    11/26/21 1452 11/26/21 2320 11/27/21 0527 11/27/21 0538  WBC 15.0* 18.5* 14.1*  --   HGB 11.1* 11.3* 10.1* 10.2*  HCT 30.8* 31.1* 27.6* 30.0*  PLT 155 174 147*  --    BMET Recent Labs    11/26/21 1452 11/26/21 2320 11/27/21 0527 11/27/21 0538  NA 118* 120* 121* 120*  K 4.7  4.8 4.7 4.7  CL 85* 86* 86*  --   CO2 _0 --   GLUCOSE 221* 139* 111*  --   BUN 32* 33*  33*  --   CREATININE 0.86 1.10 1.16  --   CALCIUM 7.9* 7.8* 8.0*  --    LFT Recent Labs    11/26/21 2320 11/27/21 0527  PROT 6.0* 5.4*  ALBUMIN 1.9* 1.7*  AST 147* 136*  ALT 48* 43  ALKPHOS 445* 375*  BILITOT 4.7* 5.2*  BILIDIR 2.4*  --   IBILI 2.3*  --    PT/INR Recent Labs    11/26/21 2320 11/27/21 0527  LABPROT 18.5* 18.7*  INR 1.6* 1.6*     Studies/Results: DG Chest Port 1 View  Result Date: 11/27/2021 CLINICAL DATA:  Vomiting, can not swallow, MS changes. EXAM: PORTABLE CHEST 1 VIEW COMPARISON:  11/23/2021. FINDINGS: The heart size and mediastinal contours are within normal limits. Lung volumes are low. No consolidation, effusion, or pneumothorax. A right chest port appear stable. No acute osseous abnormality. IMPRESSION: Low lung volumes with no active disease. Electronically Signed   By: Brett Fairy M.D.   On: 11/27/2021 00:43    Impression/Plan: Odynophagia in the setting of metastatic cholangiocarcinoma with mets to the liver. Acute encephalopathy multifactorial. Jaundice due to cholangiocarcinoma and meds likely playing a role. Suspect infectious esophagitis with his immunocompromised state on chemo. Needs EGD to further evaluate odynophagia. EGD tomorrow if serum sodium improved. Clear liquid diet. NPO p MN. If Candidiasis noted then will start IV Diflucan. Supportive care.    [name]  11/27/2021, 10:21 AM  Questions please call [phone]

## 2021-11-27 NOTE — ED Notes (Signed)
Wife at bedside provided dressing change to tube on left side at this time. Patient too sleepy at this time to take medications

## 2021-11-27 NOTE — ED Notes (Signed)
Provider notified of the patient's BG at this time via secure chat

## 2021-11-27 NOTE — ED Notes (Signed)
Pt's insulin pump disconnected per pt's wife.

## 2021-11-27 NOTE — ED Notes (Addendum)
Patient assisted to bedside commode by wife and provided with partial linen change. Patient has passed a large amount of soft stool

## 2021-11-28 ENCOUNTER — Inpatient Hospital Stay (HOSPITAL_COMMUNITY): Payer: No Typology Code available for payment source | Admitting: Certified Registered"

## 2021-11-28 ENCOUNTER — Encounter (HOSPITAL_COMMUNITY): Payer: Self-pay | Admitting: Internal Medicine

## 2021-11-28 ENCOUNTER — Encounter (HOSPITAL_COMMUNITY)
Admission: EM | Disposition: A | Discharge status: Hospice | Payer: Self-pay | Source: Home / Self Care | Attending: Family Medicine

## 2021-11-28 ENCOUNTER — Inpatient Hospital Stay (HOSPITAL_COMMUNITY): Payer: No Typology Code available for payment source

## 2021-11-28 DIAGNOSIS — K766 Portal hypertension: Secondary | ICD-10-CM | POA: Diagnosis not present

## 2021-11-28 DIAGNOSIS — K3189 Other diseases of stomach and duodenum: Secondary | ICD-10-CM

## 2021-11-28 DIAGNOSIS — K2900 Acute gastritis without bleeding: Secondary | ICD-10-CM

## 2021-11-28 DIAGNOSIS — I8510 Secondary esophageal varices without bleeding: Secondary | ICD-10-CM | POA: Diagnosis not present

## 2021-11-28 DIAGNOSIS — I10 Essential (primary) hypertension: Secondary | ICD-10-CM

## 2021-11-28 DIAGNOSIS — N179 Acute kidney failure, unspecified: Secondary | ICD-10-CM | POA: Diagnosis not present

## 2021-11-28 DIAGNOSIS — Z87891 Personal history of nicotine dependence: Secondary | ICD-10-CM

## 2021-11-28 DIAGNOSIS — E871 Hypo-osmolality and hyponatremia: Secondary | ICD-10-CM | POA: Diagnosis not present

## 2021-11-28 DIAGNOSIS — E1165 Type 2 diabetes mellitus with hyperglycemia: Secondary | ICD-10-CM | POA: Diagnosis not present

## 2021-11-28 DIAGNOSIS — K2101 Gastro-esophageal reflux disease with esophagitis, with bleeding: Secondary | ICD-10-CM

## 2021-11-28 DIAGNOSIS — G9340 Encephalopathy, unspecified: Secondary | ICD-10-CM | POA: Diagnosis not present

## 2021-11-28 HISTORY — PX: ESOPHAGEAL BRUSHING: SHX6842

## 2021-11-28 HISTORY — PX: ESOPHAGOGASTRODUODENOSCOPY (EGD) WITH PROPOFOL: SHX5813

## 2021-11-28 HISTORY — PX: BIOPSY: SHX5522

## 2021-11-28 LAB — COMPREHENSIVE METABOLIC PANEL
ALT: 40 U/L (ref 0–44)
AST: 131 U/L — ABNORMAL HIGH (ref 15–41)
Albumin: 1.8 g/dL — ABNORMAL LOW (ref 3.5–5.0)
Alkaline Phosphatase: 329 U/L — ABNORMAL HIGH (ref 38–126)
Anion gap: 7 (ref 5–15)
BUN: 27 mg/dL — ABNORMAL HIGH (ref 6–20)
CO2: 22 mmol/L (ref 22–32)
Calcium: 7.2 mg/dL — ABNORMAL LOW (ref 8.9–10.3)
Chloride: 94 mmol/L — ABNORMAL LOW (ref 98–111)
Creatinine, Ser: 1.04 mg/dL (ref 0.61–1.24)
GFR, Estimated: >60 mL/min (ref >60)
Glucose, Bld: 231 mg/dL — ABNORMAL HIGH (ref 70–99)
Potassium: 3.5 mmol/L (ref 3.5–5.1)
Sodium: 123 mmol/L — ABNORMAL LOW (ref 135–145)
Total Bilirubin: 6 mg/dL — ABNORMAL HIGH (ref 0.3–1.2)
Total Protein: 5 g/dL — ABNORMAL LOW (ref 6.5–8.1)

## 2021-11-28 LAB — CBC
HCT: 24.6 % — ABNORMAL LOW (ref 39.0–52.0)
Hemoglobin: 8.8 g/dL — ABNORMAL LOW (ref 13.0–17.0)
MCH: 35.8 pg — ABNORMAL HIGH (ref 26.0–34.0)
MCHC: 35.8 g/dL (ref 30.0–36.0)
MCV: 100 fL (ref 80.0–100.0)
Platelets: 129 10*3/uL — ABNORMAL LOW (ref 150–400)
RBC: 2.46 MIL/uL — ABNORMAL LOW (ref 4.22–5.81)
RDW: 20.2 % — ABNORMAL HIGH (ref 11.5–15.5)
WBC: 11.2 10*3/uL — ABNORMAL HIGH (ref 4.0–10.5)
nRBC: 0 % (ref 0.0–0.2)

## 2021-11-28 LAB — GLUCOSE, CAPILLARY
Glucose-Capillary: 122 mg/dL — ABNORMAL HIGH (ref 70–99)
Glucose-Capillary: 114 mg/dL — ABNORMAL HIGH (ref 70–99)
Glucose-Capillary: 196 mg/dL — ABNORMAL HIGH (ref 70–99)
Glucose-Capillary: 191 mg/dL — ABNORMAL HIGH (ref 70–99)

## 2021-11-28 LAB — LACTIC ACID, PLASMA: Lactic Acid, Venous: 3.4 mmol/L (ref 0.5–1.9)

## 2021-11-28 SURGERY — ESOPHAGOGASTRODUODENOSCOPY (EGD) WITH PROPOFOL
Anesthesia: Monitor Anesthesia Care

## 2021-11-28 MED ORDER — ALBUMIN HUMAN 5 % IV SOLN
25.0000 g | Freq: Four times a day (QID) | INTRAVENOUS | Status: AC
  Administered 2021-11-28 – 2021-11-29 (×3): 25 g via INTRAVENOUS
  Filled 2021-11-28 (×3): qty 500

## 2021-11-28 MED ORDER — IOHEXOL 350 MG/ML SOLN
75.0000 mL | Freq: Once | INTRAVENOUS | Status: AC | PRN
  Administered 2021-11-28: 75 mL via INTRAVENOUS

## 2021-11-28 MED ORDER — PROSOURCE PLUS PO LIQD
30.0000 mL | Freq: Two times a day (BID) | ORAL | Status: DC
  Administered 2021-11-28 – 2021-12-03 (×9): 30 mL via ORAL
  Filled 2021-11-28 (×10): qty 30

## 2021-11-28 MED ORDER — BOOST / RESOURCE BREEZE PO LIQD CUSTOM
1.0000 | Freq: Every day | ORAL | Status: DC
  Administered 2021-11-28 – 2021-11-30 (×9): 1 via ORAL
  Filled 2021-11-28 (×2): qty 1

## 2021-11-28 MED ORDER — PROPOFOL 10 MG/ML IV BOLUS
INTRAVENOUS | Status: DC | PRN
  Administered 2021-11-28: 30 mg via INTRAVENOUS
  Administered 2021-11-28 (×2): 20 mg via INTRAVENOUS
  Administered 2021-11-28: 30 mg via INTRAVENOUS
  Administered 2021-11-28: 50 mg via INTRAVENOUS
  Administered 2021-11-28: 30 mg via INTRAVENOUS
  Administered 2021-11-28: 20 mg via INTRAVENOUS

## 2021-11-28 MED ORDER — SUCRALFATE 1 GM/10ML PO SUSP
1.0000 g | Freq: Three times a day (TID) | ORAL | Status: DC
  Administered 2021-11-28 – 2021-12-03 (×20): 1 g via ORAL
  Filled 2021-11-28 (×20): qty 10

## 2021-11-28 MED ORDER — ORAL CARE MOUTH RINSE
15.0000 mL | OROMUCOSAL | Status: DC
  Administered 2021-11-28 – 2021-12-03 (×19): 15 mL via OROMUCOSAL

## 2021-11-28 MED ORDER — PHENYLEPHRINE 80 MCG/ML (10ML) SYRINGE FOR IV PUSH (FOR BLOOD PRESSURE SUPPORT)
PREFILLED_SYRINGE | INTRAVENOUS | Status: DC | PRN
  Administered 2021-11-28 (×2): 80 ug via INTRAVENOUS

## 2021-11-28 MED ORDER — PANTOPRAZOLE SODIUM 40 MG IV SOLR
40.0000 mg | Freq: Two times a day (BID) | INTRAVENOUS | Status: DC
  Administered 2021-11-28 – 2021-12-03 (×10): 40 mg via INTRAVENOUS
  Filled 2021-11-28 (×10): qty 10

## 2021-11-28 MED ORDER — ORAL CARE MOUTH RINSE: 15.0000 mL | OROMUCOSAL | Status: DC | PRN

## 2021-11-28 MED ORDER — LIDOCAINE HCL (CARDIAC) PF 100 MG/5ML IV SOSY
PREFILLED_SYRINGE | INTRAVENOUS | Status: DC | PRN
  Administered 2021-11-28: 100 mg via INTRAVENOUS

## 2021-11-28 MED ORDER — INSULIN PUMP
Freq: Three times a day (TID) | SUBCUTANEOUS | Status: DC
  Administered 2021-12-03: 2 via SUBCUTANEOUS
  Filled 2021-11-28: qty 1

## 2021-11-28 SURGICAL SUPPLY — 15 items

## 2021-11-28 NOTE — Progress Notes (Signed)
Initial Nutrition Assessment  DOCUMENTATION CODES:   Severe malnutrition in context of chronic illness  INTERVENTION:   - Boost Breeze po 5 x daily while on clear liquid diet, each supplement provides 250 kcal and 9 grams of protein  - PROSource Plus po BID, each supplement provides 100 kcal and 15 grams of protein  - When diet advanced past clear liquids, change supplement to Ensure Enlive po 5 x daily, each supplement provides 350 kcal and 20 grams of protein  NUTRITION DIAGNOSIS:   Severe Malnutrition related to chronic illness (stage IV cholangiocarcinoma with metastatic disease to liver) as evidenced by severe fat depletion, severe muscle depletion, percent weight loss (18.2% weight loss in less than 3 months).  GOAL:   Patient will meet greater than or equal to 90% of their needs  MONITOR:   PO intake, Supplement acceptance, Diet advancement, Labs, Weight trends, I & O's  REASON FOR ASSESSMENT:   Malnutrition Screening Tool    ASSESSMENT:   46 year old male who presented to the ED on 10/23 with emesis, AMS, and hyponatremia. PMH of stage IV cholangiocarcinoma with metastatic disease to the liver resulting in liver failure, chronic hyponatremia (baseline serum sodium 128-130), T2DM. Pt with recent hospitalization at [facility] (11/15/21-11/19/21) for dysphagia secondary to esophageal candidiasis. Pt admitted with acute metabolic encephalopathy.  10/24 - s/p EGD showing LA Grade C acute and erosive esophagitis, portal hypertensive gastropathy, acute gastritis, grade I esophageal varices  Reviewed RD note from recent admission on 11/16/21.  Spoke with pt and wife at bedside. 5 of history obtained from pt's wife. Pt's wife shares that pt weighed 240 lbs before starting cancer treatments and initially lost down to 200 lbs. Pt's MD encouraged him to eat more and pt was easily able to gain weight back to ~235 lbs. This is what pt weighed back in August 2023. Prior to August  2023, pt was eating well and having multiple large meals daily. In August, pt had worsening ascites which required weekly paracentesis. Pt would eat well immediately following the procedure, but PO intake would gradually decrease over time as ascites continued to build up and cause early satiety. Pt had PleurX catheter placed and wife was able to drain ~1 L of fluid daily. After PleurX placed, pt's PO intake improved dramatically as he was no longer feeling full. Pt was eating large meals ([place] food, [place], etc.) at this time.  Pt's wife shares that pt would usually experience N/V for 3-4 days after each chemotherapy treatment. Since last chemotherapy, however, pt's N/V did not subside and pt began vomiting almost everything that he eat or drank. He was able to keep down Ensure supplements (~6 of these daily), [employer] protein shakes, soft peaches and pears, applesauce pouches, and pudding cups. Pt was vomiting after consuming items like soups even if the soups were liquidized. Per pt's wife, pt was feeling very hungry but not able to keep anything down. Pt was diagnosed with esophageal candidiasis and started on diflucan. Since having these issues, pt's weight has dropped down to 185 lbs.  Pt has been consuming Boost Breeze supplements since being on clear liquid diet. RD to increase from TID to 5 x daily per pt's wife's request. Will also add PROSource Plus. Pt's wife requesting that supplement be changed to vanilla Ensure once diet advanced.  Reviewed weight history in chart. Pt with a 19.7 kg weight loss since 09/07/21. This is an 18.2% weight loss in less than 3 months which is severe  and significant for timeframe. Pt meets criteria for severe malnutrition.  Medications reviewed and include: Boost Breeze TID, diflucan 400 mg daily, SSI, lactulose 30 grams TID, IV protonix IVF: NS @ 125 ml/hr  Labs reviewed: sodium 121, BUN 33, ionized calcium 1.06, lipase 68, WBC 14.1 CBG's: 114-377 x 24  hours  NUTRITION - FOCUSED PHYSICAL EXAM:  Flowsheet Row Most Recent Value  Orbital Region Severe depletion  Upper Arm Region Severe depletion  Thoracic and Lumbar Region Severe depletion  Buccal Region Severe depletion  Temple Region Severe depletion  Clavicle Bone Region Severe depletion  Clavicle and Acromion Bone Region Severe depletion  Scapular Bone Region Severe depletion  Dorsal Hand Moderate depletion  Patellar Region Moderate depletion  Anterior Thigh Region Moderate depletion  Posterior Calf Region Moderate depletion  Edema (RD Assessment) None  Hair Reviewed  Eyes Reviewed  Mouth Reviewed  Skin Reviewed  Nails Reviewed       Diet Order:   Diet Order             Diet clear liquid Room service appropriate? Yes; Fluid consistency: Thin  Diet effective now                   EDUCATION NEEDS:   Education needs have been addressed  Skin:  Skin Assessment: Reviewed RN Assessment  Last BM:  11/27/21 large type 7  Height:   Ht Readings from Last 1 Encounters:  11/27/21 '6\' 2"'$  (1.88 m)    Weight:   Wt Readings from Last 1 Encounters:  11/27/21 88.3 kg    BMI:  Body mass index is 24.99 kg/m.  Estimated Nutritional Needs:   Kcal:  2500-2700  Protein:  125-140 grams  Fluid:  >2.2 L    Gustavus Bryant, MS, RD, LDN Inpatient Clinical Dietitian Please see AMiON for contact information.

## 2021-11-28 NOTE — Progress Notes (Signed)
[facility]  PROGRESS NOTE  Matthew Osborne PNT:[id number] DOB: February 14, 1975 DOA: 11/26/2021 PCP: Susy Frizzle, MD   Brief HPI:   46 y.o. male with medical history significant for stage IV cholangiocarcinoma with metastatic disease to the liver resulting in liver failure, chronic hyponatremia with baseline serum sodium of 128- 130, type 2 diabetes mellitus, who is admitted to [facility] on 11/26/2021 with acute encephalopathy after presenting from home to [facility] ED for evaluation of altered mental status.  Patient was recently hospitalized at [facility] from 11/23/2021 to 11/19/2021 for dysphagia and was diagnosed with esophageal candidiasis.  He was started on fluconazole on 11/18/2021.  Patient's sodium level was 119 on 11/15/2021 with baseline sodium of 1 28-1 30.This subsequent improved with IV fluids, back to baseline, with final serum sodium level of 128 noted on day of discharge to home from [facility] on approximately 3 more weeks of fluconazole for esophageal candidiasis.   Over the past few days patient has been having increasing somnolence , with poor p.o. intake. Patient is followed by oncology for stage IV cholangiocarcinoma, he also has recurrent ascites requiring frequent paracentesis.  Ultimately he underwent placement of Pleurx catheter on 10/31/2021.  Patient was noted with increasing somnolence and chronic hyponatremia.   Subjective   Patient seen and examined, appears lethargic.  Denies shortness of breath.   Assessment/Plan:    Acute metabolic encephalopathy -Likely from hepatic encephalopathy, INR is up to 1.6 -Ammonia level elevated at 169; improved to 65 this morning -Continue lactulose 30 g p.o. 3 times daily -Chest x-ray was unremarkable -CT abdomen obtained today shows multiple hepatic metastasis  Lactic acidosis -Patient presented with lactic of 4.1 -No overt infection was suspected -Patient was given IV fluids -Repeat lactic acid was down to  2.9 and now again up to 3.4 -Likely from hepatic metastasis  Acute on chronic hyponatremia -Patient has chronic hyponatremia with serum sodium of 128-130 -Presented with sodium of 120; urine osmolality 635, urine sodium less than 10 -Patient has significant ascites;?  Hypervolemic hyponatremia -We will consult nephrology for further help with management of hyponatremia  Malignant ascites -Patient has a peritoneal catheter in place -As per patient wife they were draining about a liter every day at home -Nephrology consulted to help with fluid management  Dysphagia -Patient had no improvement despite course of fluconazole -GI was consulted -Patient underwent EGD this morning which showed erosive esophagitis -Started on sucralfate; await biopsy results  Diabetes mellitus type 2 -Continue insulin pump -CBG well controlled  Stage IV cholangiocarcinoma -Followed by oncology  Acute kidney injury -Resolved with IV hydration   Medications     (feeding supplement) PROSource Plus  30 mL Oral BID BM   Chlorhexidine Gluconate Cloth  6 each Topical Daily   feeding supplement  1 Container Oral 5 X Daily   fluconazole  400 mg Oral Daily   insulin pump   Subcutaneous TID WC, HS, 0200   lactulose  30 g Oral TID   mouth rinse  15 mL Mouth Rinse 4 times per day   pantoprazole (PROTONIX) IV  40 mg Intravenous Q12H   sucralfate  1 g Oral TID WC & HS     Data Reviewed:   CBG:  Recent Labs  Lab 11/27/21 1805 11/27/21 2051 11/28/21 0824 11/28/21 1010 11/28/21 1206  GLUCAP 377* 289* 122* 114* 196*    SpO2: 99 %    Vitals:   11/28/21 0945 11/28/21 1000 11/28/21 1015 11/28/21 1206  BP:  96/69 106/79 111/82 121/73  Pulse: 82 84 84 87  Resp: (!) '24 18 17   '$ Temp:   97.8 F (36.6 C) (!) 97.5 F (36.4 C)  TempSrc:    Oral  SpO2: 97% 98% 98% 99%  Weight:      Height:          Data Reviewed:  Basic Metabolic Panel: Recent Labs  Lab 11/23/21 0856 11/26/21 1452  11/26/21 2320 11/27/21 0527 11/27/21 0538 11/27/21 1650 11/27/21 2108 11/28/21 1215  NA 124* 118* 120* 121* 120* 123* 121* 123*  K 5.4* 4.7 4.8 4.7 4.7  --   --  3.5  CL 92* 85* 86* 86*  --   --   --  94*  CO2 '28 26 22 24  '$ --   --   --  22  GLUCOSE 144* 221* 139* 111*  --   --   --  231*  BUN 27* 32* 33* 33*  --   --   --  27*  CREATININE 0.80 0.86 1.10 1.16  --   --   --  1.04  CALCIUM 8.4* 7.9* 7.8* 8.0*  --   --   --  7.2*  MG  --   --  2.1 2.0  --   --   --   --     CBC: Recent Labs  Lab 11/23/21 0856 11/26/21 1452 11/26/21 2320 11/27/21 0527 11/27/21 0538 11/28/21 1215  WBC 17.3* 15.0* 18.5* 14.1*  --  11.2*  NEUTROABS 13.3* 12.4* 15.7* 11.3*  --   --   HGB 11.3* 11.1* 11.3* 10.1* 10.2* 8.8*  HCT 31.7* 30.8* 31.1* 27.6* 30.0* 24.6*  MCV 97.2 96.6 97.8 97.9  --  100.0  PLT 148* 155 174 147*  --  129*    LFT Recent Labs  Lab 11/23/21 0856 11/26/21 1452 11/26/21 2320 11/27/21 0527 11/28/21 1215  AST 119* 121* 147* 136* 131*  ALT 41 41 48* 43 40  ALKPHOS 468* 458* 445* 375* 329*  BILITOT 4.7* 4.4* 4.7* 5.2* 6.0*  PROT 5.2* 5.5* 6.0* 5.4* 5.0*  ALBUMIN 2.5* 2.2* 1.9* 1.7* 1.8*     Antibiotics: Anti-infectives (From admission, onward)    Start     Dose/Rate Route Frequency Ordered Stop   11/27/21 1815  fluconazole (DIFLUCAN) tablet 400 mg        400 mg Oral Daily 11/27/21 1813          DVT prophylaxis: SCDs  Code Status: Full code  Family Communication: Discussed with wife at bedside   CONSULTS oncology   Objective    Physical Examination:   General: Appears in no acute distress Cardiovascular: S1-S2, regular, no murmur auscultated Respiratory: Clear to auscultation bilaterally Abdomen: Abdomen soft, distended, Pleurx catheter in place Extremities: No edema in the lower extremities Neurologic: Somnolent but arousable, no focal deficit noted   Status is: Inpatient:             George Mason   [facility] If  7PM-7AM, please contact night-coverage at [email or web address], Office  [phone]   11/28/2021, 5:29 PM  LOS: 1 day

## 2021-11-28 NOTE — Inpatient Diabetes Management (Signed)
Inpatient Diabetes Program Recommendations  AACE/ADA: New Consensus Statement on Inpatient Glycemic Control (2015)  Target Ranges:  Prepandial:   less than 140 mg/dL      Peak postprandial:   less than 180 mg/dL (1-2 hours)      Critically ill patients:  140 - 180 mg/dL   Lab Results  Component Value Date   GLUCAP 196 (H) 11/28/2021   HGBA1C 5.7 (H) 11/15/2021    Review of Glycemic Control  Latest Reference Range & Units 11/27/21 18:05 11/27/21 20:51 11/28/21 08:24 11/28/21 10:10 11/28/21 12:06  Glucose-Capillary 70 - 99 mg/dL 377 (H) 289 (H) 122 (H) 114 (H) 196 (H)  (H): Data is abnormally high  Diabetes history: DM2  Outpatient Diabetes medications:  Omnipod and Dexcom 12a-8a-3 units/hr 8a-12a-4 units/hr Total basal is 88 units/24 hrs ICR-1units for every 8 CHO's ISF-1 unit brings him down 35 mg/dL  Current orders for Inpatient glycemic control:  Novolog 0-9 units TID and 0-5 units QHS   Spoke with patient and spouse at bedside.   Verified insulin pump is not on and running.  He does have his Dexcom on.  Confirmed above insulin pump settings.  He sees Dr. Chalmers Cater for endocrinology.  He was NPO this morning and is now eating.  Expect glucose to begin to rise.  Wife manages insulin pump 100% of the time.  She stays with him when admitted.  She would like his pump placed back on as his glucose trends were elevated last evening.  She says no further anticipation of NPO status.   Please consider:  Insulin Pump Therapy and discontinue Novolog 0-9 units TID and 0-5 QHS.    RN, please print out insulin pump contract and flow sheet.    Will continue to follow while inpatient.  Thank you, Reche Dixon, MSN, [place] Diabetes Coordinator Inpatient Diabetes Program [phone] (team pager from 8a-5p)

## 2021-11-28 NOTE — Interval H&P Note (Signed)
History and Physical Interval Note:  11/28/2021 8:38 AM  Matthew Osborne  has presented today for surgery, with the diagnosis of Odynophagia; Feeding Difficulties.  The various methods of treatment have been discussed with the patient and family. After consideration of risks, benefits and other options for treatment, the patient has consented to  Procedure(s): ESOPHAGOGASTRODUODENOSCOPY (EGD) WITH PROPOFOL (N/A) as a surgical intervention.  The patient's history has been reviewed, patient examined, no change in status, stable for surgery.  I have reviewed the patient's chart and labs.  Questions were answered to the patient's satisfaction.     [name]

## 2021-11-28 NOTE — Anesthesia Preprocedure Evaluation (Signed)
Anesthesia Evaluation  Patient identified by MRN, date of birth, ID band Patient awake and Patient confused    Reviewed: Allergy & Precautions, NPO status , Patient's Chart, lab work & pertinent test results  Airway Mallampati: II  TM Distance: >3 FB     Dental   Pulmonary former smoker,    Pulmonary exam normal        Cardiovascular hypertension, Normal cardiovascular exam     Neuro/Psych negative neurological ROS     GI/Hepatic Neg liver ROS, odynophagia   Endo/Other  diabetes  Renal/GU Renal disease     Musculoskeletal   Abdominal   Peds  Hematology  (+) Blood dyscrasia, anemia ,   Anesthesia Other Findings   Reproductive/Obstetrics                             Anesthesia Physical Anesthesia Plan  ASA: 3  Anesthesia Plan: MAC   Post-op Pain Management:    Induction:   PONV Risk Score and Plan: 1 and Propofol infusion  Airway Management Planned: Natural Airway  Additional Equipment:   Intra-op Plan:   Post-operative Plan:   Informed Consent: I have reviewed the patients History and Physical, chart, labs and discussed the procedure including the risks, benefits and alternatives for the proposed anesthesia with the patient or authorized representative who has indicated his/her understanding and acceptance.       Plan Discussed with:   Anesthesia Plan Comments:         Anesthesia Quick Evaluation

## 2021-11-28 NOTE — Progress Notes (Signed)
Wife at bedside. Signed consent for EDG.

## 2021-11-28 NOTE — Consult Note (Signed)
   [facility] CM Inpatient Consult   11/28/2021  Matthew Osborne 06/15/1975 [id number]  [facility] [[facility]] Patient:  Primary Care Provider:  Susy Frizzle, MD, [place], is listed to provide the Transition of Care follow up   Patient screened for hospitalization with noted high risk score for unplanned readmission risk noted. Came by room  at 4:00 pm in nursing care, and did not review chart as patient's care management is managed with an external team.  Plan:  Will sign off. Care Management needs are followed by an external team.  For questions contact:   Natividad Brood, RN BSN [place]  [phone] business mobile phone Toll free office [phone]  *[place]  [phone] Fax number: [phone] [email or web address]

## 2021-11-28 NOTE — Progress Notes (Signed)
IP PROGRESS NOTE  Subjective:   Matthew Osborne is well-known to me with a history of cholangiocarcinoma.  He is most recently been treated with salvage FOLFIRI chemotherapy.  His performance status has declined over recent weeks.  He was recently treated for oropharyngeal candidiasis. He presented to emergency room yesterday with nausea/vomiting and confusion.  He was noted to have hyponatremia and evidence of hepatic encephalopathy. His wife is at the bedside.  She reports he has been vomiting after eating.  He is tolerating Ensure.  He is lethargic this morning after receiving Ativan.  He received lactulose.  Objective: Vital signs in last 24 hours: Blood pressure 130/83, pulse 93, temperature (!) 97.4 F (36.3 C), temperature source Temporal, resp. rate 15, height 6' 2"  (1.88 m), weight 194 lb 10.7 oz (88.3 kg), SpO2 99 %.  Intake/Output from previous day: 10/24 0701 - 10/25 0700 In: 1990.1 [I.V.:1990.1] Out: -   Physical Exam:  HEENT: Scleral icterus, no thrush at the pharynx or buccal mucosa Lungs: Lear anteriorly, no respiratory distress Cardiac: Regular rate and rhythm Abdomen: Mildly distended with ascites, left abdominal peritoneal drain with a gauze dressing Extremities: No leg edema Neurologic: Lethargic, arousable, follows commands.  Speaks a few words and short sentences  Portacath/PICC-without erythema  Lab Results: Recent Labs    11/26/21 2320 11/27/21 0527 11/27/21 0538  WBC 18.5* 14.1*  --   HGB 11.3* 10.1* 10.2*  HCT 31.1* 27.6* 30.0*  PLT 174 147*  --     BMET Recent Labs    11/26/21 2320 11/27/21 0527 11/27/21 0538 11/27/21 1650 11/27/21 2108  NA 120* 121* 120* 123* 121*  K 4.8 4.7 4.7  --   --   CL 86* 86*  --   --   --   CO2 22 24  --   --   --   GLUCOSE 139* 111*  --   --   --   BUN 33* 33*  --   --   --   CREATININE 1.10 1.16  --   --   --   CALCIUM 7.8* 8.0*  --   --   --     Lab Results  Component Value Date   CEA1 2.15 01/14/2020    [id number] 522 (H) 11/23/2021    Studies/Results: DG Chest Port 1 View  Result Date: 11/27/2021 CLINICAL DATA:  Vomiting, can not swallow, MS changes. EXAM: PORTABLE CHEST 1 VIEW COMPARISON:  11/23/2021. FINDINGS: The heart size and mediastinal contours are within normal limits. Lung volumes are low. No consolidation, effusion, or pneumothorax. A right chest port appear stable. No acute osseous abnormality. IMPRESSION: Low lung volumes with no active disease. Electronically Signed   By: Brett Fairy M.D.   On: 11/27/2021 00:43    Medications: I have reviewed the patient's current medications.  Assessment/Plan: Cholangiocarcinoma  multiple liver masses and abdominal lymphadenopathy CTs 01/13/2020-rounded hypodense mass appears to arise from the pancreas neck, multiple rim-enhancing masses in the liver, primarily left liver with segmental dilation of the left lobe Intermatic bile ducts, ill-defined hypodensity the central liver with effacement of the left portal vein, enlarged portacaval and retroperitoneal lymph nodes Ultrasound-guided biopsy of the left liver lesion 01/19/2020-adenocarcinoma, cytokeratin 7+, MSS, tumor mutation burden 1, IDH1 R132C MRI abdomen 01/31/2020-poorly marginated central liver mass, multiple smaller similar satellite liver masses, extrinsic mass-effect at the biliary hilum with intrahepatic biliary ductal dilatation throughout the left liver with mild Intermatic dilatation the superior right liver, no pancreas mass or ductal dilatation,  normal spleen size, numerous enlarged enhancing lymph nodes at the porta hepatis, peripancreatic, portacaval, aortocaval, left periaortic chains Cycle 1 gemcitabine/cisplatin 02/04/2020 Cycle 2 gemcitabine/cisplatin 02/28/2020 Cycle 3 gemcitabine/cisplatin 03/20/2020 CT abdomen/pelvis 03/31/2020-mild decrease in central liver tumor, mild decrease in size of liver metastases and upper abdominal adenopathy, new splenomegaly Cycle 4  gemcitabine/cisplatin 04/10/2020 Cycle 5 gemcitabine/cisplatin 05/01/2020 Cycle 6 gemcitabine/cisplatin plus durvalumab 05/22/2020 CTs 06/06/2020- dominant central liver mass mildly enlarged, other liver lesions and abdominal adenopathy is stable, no evidence of metastatic disease to the chest Cycle 7 gemcitabine/cisplatin plus Durvalumab 06/12/2020 Cycle 8 gemcitabine/cisplatin plus Durvalumab 06/30/2020 Cycle 9 gemcitabine/cisplatin plus Durvalumab 07/31/2020 CT abdomen/pelvis 08/20/2020-decreased size of dominant central liver mass, slight increase in size and stable additional liver lesions, stable portacaval node Cycle 10 gemcitabine/cisplatin plus Durvalumab 08/21/2020 Cycle 11 gemcitabine/cisplatin plus Durvalumab 09/11/2020 Cycle 12 gemcitabine/cisplatin plus Durvalumab 10/02/2020 Cycle 13 gemcitabine/cisplatin plus Durvalumab 10/23/2020 CT abdomen/pelvis 11/10/2020-no change in dominant central liver mass and additional liver lesions, stable upper retroperitoneal adenopathy Cycle 14 gemcitabine/cisplatin plus Durvalumab 11/13/2020 Cycle 15 gemcitabine/cisplatin plus Durvalumab 12/04/2020 Cycle 15 gemcitabine/cisplatin plus Durvalumab 01/08/2021 Cycle 16 gemcitabine/cisplatin plus Durvalumab 01/30/2021-cisplatin held from day 1 and day 8 secondary to neuropathy CTs 02/15/2021-slight increase in size of central liver mass, progressive metastatic lesions in the right liver, stable to slightly decreased periportal lymph nodes, progression of splenomegaly, chronically thrombosed left portal vein Cycle 1 FOLFOX 03/05/2021 Cycle 2 FOLFOX 03/19/2021, oxaliplatin dose reduced due to thrombocytopenia Cycle 3 FOLFOX 04/02/2021 Cycle 4 FOLFOX 04/16/2021 Cycle 5 FOLFOX 04/30/2021 CTs 05/10/2021-new and enlarging liver metastases, chronic left lobe biliary dilatation, chronic left portal vein thrombosis, stable porta hepatis adenopathy, splenomegaly, no evidence of metastatic disease to the chest Ivosidenib 05/21/2021 CT  07/13/2021-increase in size of periportal node, stable retroperitoneal nodes, dominant left hepatic mass-stable, increased necrosis of other liver lesions, some have increased in size Ivosidenib continued CT abdomen/pelvis 09/06/2021-progression of ascites, progression of liver metastases, stable necrotic lymphadenopathy in the periportal region Ivosedinib discontinued 09/07/2021 Cycle 1 FOLFIRI 09/24/2021 Cycle 2 held on 10/10/2021 due to neutropenia, elevated bilirubin Cycle 2 FOLFIRI 10/22/2021 Cycle 3 FOLFIRI 11/05/2021   Cough-likely related to diaphragmatic irritation from #1 Anorexia/weight loss Hypercalcemia-likely hypercalcemia malignancy, status post intravenous hydration and Zometa 01/14/2020 Diabetes Hyperlipidemia Hypertension History of peripheral neuropathy secondary to diabetes Severe back pain following Fulphila-oxycodone prescribed Thrombocytopenia secondary to chemotherapy-gemcitabine held with day 1 cycle 8 and then dose reduced COVID-19 infection 07/22/2020, 12/25/2020 Cisplatin induced peripheral neuropathy Admission 11/15/2021 with oropharyngeal candidiasis, mucositis?,  Neutropenia, and multiple electrolyte abnormalities Admission 11/28/2021 with hyponatremia, hepatic encephalopathy, nausea/vomiting  Mr. Backes has advanced stage cholangiocarcinoma.  He presents with nausea/vomiting, failure to thrive, dehydration, hyponatremia, and hepatic encephalopathy.  I suspect the complex issues this admission are related to progression of the cholangiocarcinoma.  I discussed management options with Mr. Newberry and his wife.  He is lethargic, but arousable this morning.  I recommend comfort/hospice care.  I recommend limiting blood draws and attempts at managing electrolyte abnormalities.  They would like to proceed with a restaging CT prior to committing to hospice care or deciding on a CODE STATUS.  I recommended he be placed on a no CODE BLUE status, but he did not wish to decide this  today.  Recommendations: Restaging CT abdomen/pelvis Continue discussions with Mr. Heindl and his wife regarding hospice care and CODE STATUS He would like to proceed with an upper endoscopy I will check on him 11/29/2021    LOS: 1 day   Betsy Coder, MD  11/28/2021, 9:01 AM

## 2021-11-28 NOTE — TOC Initial Note (Signed)
Transition of Care [facility]) - Initial/Assessment Note    Patient Details  Name: Matthew Osborne MRN: [id number] Date of Birth: 1975/05/25  Transition of Care [facility]) CM/SW Contact:    Tom-Johnson, Renea Ee, RN Phone Number: 11/28/2021, 4:29 PM  Clinical Narrative:                  CM spoke with patient and wife, [name] at bedside about needs for post hospital transition. Admitted for Acute Encephalopathy. 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 Recently admitted at [facility] for Dysphagia. Had EGD today. From home with wife, has three children. Dad supportive with care, has two siblings but has no relationship with them. Has a walker and shower stool at home.  PCP is Pickard, Cammie Mcgee, MD and uses [employer].  No TOC needs or recommendations noted at this time. CM will continue to follow as patient progresses with care towards discharge.    Barriers to Discharge: Continued Medical Work up   Patient Goals and CMS Choice Patient states their goals for this hospitalization and ongoing recovery are:: To return home CMS Medicare.gov Compare Post Acute Care list provided to:: Patient Choice offered to / list presented to : Patient, Spouse  Expected Discharge Plan and Services     Discharge Planning Services: CM Consult   Living arrangements for the past 2 months: Single Family Home                 DME Arranged: N/A DME Agency: NA                  Prior Living Arrangements/Services Living arrangements for the past 2 months: Single Family Home Lives with:: Spouse Patient language and need for interpreter reviewed:: Yes Do you feel safe going back to the place where you live?: Yes      Need for Family Participation in Patient Care: Yes (Comment) Care giver support system in place?: Yes (comment) Current home services: DME (walker, shower stool) Criminal Activity/Legal Involvement Pertinent to  Current Situation/Hospitalization: No - Comment as needed  Activities of Daily Living Home Assistive Devices/Equipment: None ADL Screening (condition at time of admission) Patient's cognitive ability adequate to safely complete daily activities?: Yes Is the patient deaf or have difficulty hearing?: No Does the patient have difficulty seeing, even when wearing glasses/contacts?: No Does the patient have difficulty concentrating, remembering, or making decisions?: Yes Patient able to express need for assistance with ADLs?: Yes Does the patient have difficulty dressing or bathing?: Yes Independently performs ADLs?: No Communication: Independent Dressing (OT): Independent Grooming: Independent Feeding: Independent Bathing: Needs assistance Is this a change from baseline?: Pre-admission baseline Toileting: Needs assistance Is this a change from baseline?: Pre-admission baseline In/Out Bed: Needs assistance Is this a change from baseline?: Pre-admission baseline Walks in Home: Needs assistance Is this a change from baseline?: Pre-admission baseline Does the patient have difficulty walking or climbing stairs?: Yes Weakness of Legs: Both Weakness of Arms/Hands: None  Permission Sought/Granted Permission sought to share information with : Case Manager, Family Supports Permission granted to share information with : Yes, Verbal Permission Granted              Emotional Assessment Appearance:: Appears older than stated age Attitude/Demeanor/Rapport: Engaged, Gracious Affect (typically observed): Accepting, Appropriate, Calm, Hopeful, Pleasant Orientation: : Oriented to Self, Oriented to Place, Oriented to Situation Alcohol / Substance Use: Not Applicable Psych Involvement: No (comment)  Admission  diagnosis:  Hepatic encephalopathy ([place]) [K76.82] Hyponatremia [E87.1] Acute encephalopathy [G93.40] Patient Active Problem List   Diagnosis Date Noted   Acute encephalopathy 11/27/2021    Lactic acidosis 11/27/2021   Leukocytosis 11/27/2021   Liver failure ([place]) 11/27/2021   Elevated LFTs 11/17/2021   Odynophagia 11/16/2021   Protein-calorie malnutrition, severe 11/16/2021   Esophageal candidiasis ([place]) 11/15/2021   Acute hyponatremia 11/15/2021   AKI (acute kidney injury) ([place]) 11/15/2021   Malignant ascites 11/15/2021   Neutropenia ([place]) 11/15/2021   Anxiety 11/15/2021   COVID 12/25/2020   COVID-19 virus infection 12/25/2020   Genetic testing 03/24/2020   Family history of breast cancer    Family history of Hodgkin's lymphoma    Cholangiocarcinoma metastatic to liver ([place]) 02/01/2020   Goals of care, counseling/discussion 01/21/2020   Pancreas cancer ([place]) 01/21/2020   Hypercalcemia 01/14/2020   Left elbow pain 05/19/2019   Numbness 05/19/2019   Nonproliferative retinopathy due to secondary diabetes ([place])    Allergic rhinitis 06/17/2014   Sinus congestion 05/27/2014   Chronic cough 02/14/2014   Essential hypertension, benign 06/30/2013   Hyperlipidemia 06/30/2013   DM2 (diabetes mellitus, type 2) ([place]) 06/30/2013   PCP:  Susy Frizzle, MD Pharmacy:   [place] [address] Phone: [phone] Fax: [phone]     Social Determinants of Health (SDOH) Interventions    Readmission Risk Interventions     No data to display

## 2021-11-28 NOTE — Consult Note (Addendum)
[facility]  INPATIENT CONSULTATION  Reason for Consultation: hyponatremia Requesting Provider: Dr. Darrick Meigs  HPI: Matthew Osborne is an 46 y.o. male with HTN, DM, cholangiocarcinoma who is seen for evaluation and management of hyponatremia.     He presented to ED last night with confusion.  Recently admitted [facility] 10/12-16 for esophageal candidiasis treated with fluconazole.  Had hypovolemic hyponatremia to 119 treated with volume expansion and improved to baseline sodium 128-130.  Initially did ok at home but became more lethargic in the last few days with decreased po intake.  Labs 10/20 showed serum sodium 124 and was 118 on 10/23 afternoon in the cancer center > 120 in the ED and has been in the 120-123 range on serial checks since then, most recently 123 at 12:15p today.   He's received 2.5L of IVF since admission. No outs or weights recorded.  K normal, Cr 1, albumin 1.8.  10/24 urine osm 635, urine sodium < 10.   Cholangiocarcinoma, stage 4, initially diagnosed 2021, most recently treated with salvage FOLFIRI.   Complicated by liver failure, recent failure to thrive, hepatic encephalopathy.  Peritoneal drain inserted 10/31/21, they'd been draining 1L/day at home on most days prior to [facility] admission, a few liters at [facility], none at home since.  The drain leaks significantly around the tubing particularly if not drained.  Wife reports the drain has greatly improved QoL after placement - early satiety was a prior prominent symptoms, now much improved though PO intake affected by recent odynophagia.  Due to ongoing symptoms he underwent EGD today which showed erosive esophagitis and some gastritis - samples taken; for now sucralfate added.   He underwent a repeat staging CT this afternoon to help guide a decision regarding hospice which has been recommended by his oncologist Dr. Benay Spice.   The patient was resting with eyes closed for most of visit.  Wife, who is codirected in ED and RN, provided  the history bedside.  She feels he's dry and is very interested in continuing the low volume ascites drains.  PMH: Past Medical History:  Diagnosis Date   Cancer ([place])    Cholangiocarcinoma ([place])    stage 4; mets to liver   De Quervain's tenosynovitis, left 03/2011   Diabetes mellitus    NIDDM   Family history of breast cancer    Family history of Hodgkin's lymphoma    Hyperlipidemia    Hypertension    under control; has been on med. x 4 yrs.   Nonproliferative retinopathy due to secondary diabetes ([place])    PSH: Past Surgical History:  Procedure Laterality Date   CARPAL TUNNEL RELEASE     bilat.   DORSAL COMPARTMENT RELEASE  03/14/2011   Procedure: RELEASE DORSAL COMPARTMENT (DEQUERVAIN);  Surgeon: Paulene Floor, MD;  Location: [place];  Service: Orthopedics;  Laterality: Left;  left wrist APL and EPL tenosynovectomy and 1st dorsal compartment release   IR IMAGE GUIDED DRAINAGE PERCUT CATH  PERITONEAL RETROPERIT  10/31/2021   IR IMAGING GUIDED PORT INSERTION  02/01/2020   IR PARACENTESIS  09/10/2021   IR PARACENTESIS  09/21/2021   IR PARACENTESIS  09/27/2021   IR PARACENTESIS  10/03/2021   IR PARACENTESIS  10/12/2021   IR PARACENTESIS  10/19/2021   IR PARACENTESIS  10/25/2021   IR PARACENTESIS  11/06/2021   TONSILLECTOMY AND ADENOIDECTOMY  as a child   VASECTOMY      Past Medical History:  Diagnosis Date   Cancer ([place])  Cholangiocarcinoma ([place])    stage 4; mets to liver   De Quervain's tenosynovitis, left 03/2011   Diabetes mellitus    NIDDM   Family history of breast cancer    Family history of Hodgkin's lymphoma    Hyperlipidemia    Hypertension    under control; has been on med. x 4 yrs.   Nonproliferative retinopathy due to secondary diabetes ([place])     Medications:  I have reviewed the patient's current medications.  Medications Prior to Admission  Medication Sig Dispense Refill   dicyclomine (BENTYL) 10 MG/5ML solution Take 5 mLs (10 mg  total) by mouth 3 (three) times daily as needed (abdominal pain). 473 mL 2   fluconazole (DIFLUCAN) 200 MG tablet Take 2 tablets (400 mg total) by mouth daily for 23 days. 46 tablet 0   FLUoxetine (PROZAC) 20 MG capsule Take 1 capsule (20 mg total) by mouth daily. 90 capsule 3   Glucagon (BAQSIMI TWO PACK) 3 MG/DOSE POWD as directed Nasally Once a day PRN 30 days 2 each 6   insulin degludec (TRESIBA FLEXTOUCH) 100 UNIT/ML FlexTouch Pen Inject 6 units under the skin once daily. 6 mL 6   Insulin Disposable Pump (OMNIPOD 5 G6 POD, GEN 5,) MISC Use as directed every 48 hours 45 each 0   insulin lispro (HUMALOG) 100 UNIT/ML injection Inject 200 units/day under the skin via pump 60 mL 4   lidocaine-prilocaine (EMLA) cream APPLY 1 APPLICATION TOPICALLY AS DIRECTED. APPLY TO PORT SITE 1-2 HOURS PRIOR TO STICK AND COVER WITH PLASTIC WRAP. (Patient taking differently: Apply 1 Application topically See admin instructions. APPLY 1 APPLICATION TOPICALLY AS DIRECTED. APPLY TO PORT SITE 1-2 HOURS PRIOR TO STICK AND COVER WITH PLASTIC WRAP) 30 g 2   loratadine (CLARITIN) 10 MG tablet Take 10 mg by mouth as needed for allergies.     LORazepam (ATIVAN) 0.5 MG tablet Take 1 tablet (0.5 mg total) by mouth at bedtime as needed for anxiety. 30 tablet 0   magic mouthwash (nystatin, lidocaine, diphenhydrAMINE, alum & mag hydroxide) suspension Take 10 mLs by mouth every 4 (four) hours as needed for mouth pain. 500 mL 1   oxyCODONE (OXY IR/ROXICODONE) 5 MG immediate release tablet Take 1 tablet (5 mg total) by mouth every 6 (six) hours as needed for severe pain. 10 tablet 0   pantoprazole (PROTONIX) 40 MG tablet Take 1 tablet (40 mg total) by mouth daily. 30 tablet 3   prochlorperazine (COMPAZINE) 10 MG tablet TAKE 1 TABLET BY MOUTH EVERY 6 HOURS AS NEEDED FOR NAUSEA (Patient taking differently: Take 10 mg by mouth every 6 (six) hours as needed for nausea.) 60 tablet 1   Senna (SENOKOT LAXATIVE GUMMIES PO) Take 2 tablets by  mouth daily at 12 noon.     sucralfate (CARAFATE) 1 g tablet Take 1 tablet (1 g total) by mouth 4 (four) times daily -  with meals and at bedtime. 40 tablet 1   blood glucose meter kit and supplies KIT Fasting prior to each meal three times a day 1 each 0   Blood Glucose Monitoring Suppl (FREESTYLE LITE) w/Device KIT Use as directed. 1 kit 0   Continuous Blood Gluc Sensor (DEXCOM G6 SENSOR) MISC Use as directed to check blood sugar 5-6 times daily. 9 each 3   Continuous Blood Gluc Transmit (DEXCOM G6 TRANSMITTER) MISC USE AS DIRECTED TO CHECK BLOOD SUGAR 5-6 TIMES DAILY *needs office visit* 1 each 1   glucose blood test strip Dispense based  on patient and insurance preference.  Use twice daily as directed. (FOR ICD-10 E11.65) 100 each 11   Insulin Pen Needle (UNIFINE PENTIPS) 32G X 4 MM MISC Use as directed 100 each 5   Insulin Syringe 27G X 1/2" 0.5 ML MISC Use as directed to inject insulin SQ Q1D. 100 each 3   Lancets Thin MISC Check BS BID 100 each 5   TRUEPLUS LANCETS 33G MISC 1 each by Does not apply route 2 (two) times daily. 100 each 11    ALLERGIES:   Allergies  Allergen Reactions   Niaspan [Niacin] Other (See Comments)    Flushing     FAM HX: Family History  Problem Relation Age of Onset   Cancer Other    Hyperlipidemia Other    Hypertension Brother    Asthma Son    Lung cancer Maternal Grandfather    Mesothelioma Maternal Grandfather        asbestos exposure   Rheum arthritis Maternal Grandmother    Breast cancer Maternal Grandmother 30   Hodgkin's lymphoma Mother 21       30 lb tumor removed from abdomen   Cirrhosis Paternal Grandmother    Other Maternal Great-grandfather        black lung, worked in [place] (MGF's father)    Social History:   reports that he quit smoking about 22 years ago. His smoking use included cigarettes. He has a 18.00 pack-year smoking history. He has never used smokeless tobacco. He reports that he does not drink alcohol and does not  use drugs.  ROS: 12 system ROS per HPI above  Blood pressure 121/73, pulse 87, temperature (!) 97.5 F (36.4 C), temperature source Oral, resp. rate 17, height _0  (1.88 m), weight 88.3 kg, SpO2 99 %. PHYSICAL EXAM: Gen: cachetic, sleeping until awakened then still closes eyes mainly  Eyes: icteric ENT: MM dry Neck:flat JVD CV:  RRR Abd:  soft, not tense, LLQ peritoneal drain dressing c/d/I and had just been changed by wife Extr: trace ankle edema Neuro: conversant but slow to speak Skin: no rashes, a few excoriations on LE; omnipod insulin pump on abd wall   Results for orders placed or performed during the hospital encounter of 11/26/21 (from the past 48 hour(s))  CBC with Differential/Platelet     Status: Abnormal   Collection Time: 11/26/21 11:20 PM  Result Value Ref Range   WBC 18.5 (H) 4.0 - 10.5 K/uL   RBC 3.18 (L) 4.22 - 5.81 MIL/uL   Hemoglobin 11.3 (L) 13.0 - 17.0 g/dL   HCT 31.1 (L) 39.0 - 52.0 %   MCV 97.8 80.0 - 100.0 fL   MCH 35.5 (H) 26.0 - 34.0 pg   MCHC 36.3 (H) 30.0 - 36.0 g/dL   RDW 19.7 (H) 11.5 - 15.5 %   Platelets 174 150 - 400 K/uL   nRBC 0.0 0.0 - 0.2 %   Neutrophils Relative % 85 %   Neutro Abs 15.7 (H) 1.7 - 7.7 K/uL   Lymphocytes Relative 5 %   Lymphs Abs 0.9 0.7 - 4.0 K/uL   Monocytes Relative 7 %   Monocytes Absolute 1.4 (H) 0.1 - 1.0 K/uL   Eosinophils Relative 0 %   Eosinophils Absolute 0.1 0.0 - 0.5 K/uL   Basophils Relative 1 %   Basophils Absolute 0.2 (H) 0.0 - 0.1 K/uL   Immature Granulocytes 2 %   Abs Immature Granulocytes 0.28 (H) 0.00 - 0.07 K/uL    Comment: Performed at  [facility], [place] [address], [place], [place] [address]  Basic metabolic panel     Status: Abnormal   Collection Time: 11/26/21 11:20 PM  Result Value Ref Range   Sodium 120 (L) 135 - 145 mmol/L   Potassium 4.8 3.5 - 5.1 mmol/L   Chloride 86 (L) 98 - 111 mmol/L   CO2 22 22 - 32 mmol/L   Glucose, Bld 139 (H) 70 - 99 mg/dL    Comment: Glucose reference  range applies only to samples taken after fasting for at least 8 hours.   BUN 33 (H) 6 - 20 mg/dL   Creatinine, Ser 1.10 0.61 - 1.24 mg/dL   Calcium 7.8 (L) 8.9 - 10.3 mg/dL   GFR, Estimated >60 >60 mL/min    Comment: (NOTE) Calculated using the CKD-EPI Creatinine Equation (2021)    Anion gap 12 5 - 15    Comment: Performed at [facility] [address] [place], [place] [address]  Hepatic function panel     Status: Abnormal   Collection Time: 11/26/21 11:20 PM  Result Value Ref Range   Total Protein 6.0 (L) 6.5 - 8.1 g/dL   Albumin 1.9 (L) 3.5 - 5.0 g/dL   AST 147 (H) 15 - 41 U/L   ALT 48 (H) 0 - 44 U/L   Alkaline Phosphatase 445 (H) 38 - 126 U/L   Total Bilirubin 4.7 (H) 0.3 - 1.2 mg/dL   Bilirubin, Direct 2.4 (H) 0.0 - 0.2 mg/dL   Indirect Bilirubin 2.3 (H) 0.3 - 0.9 mg/dL    Comment: Performed at [facility] [address] [place], [place] [address]  Lipase, blood     Status: Abnormal   Collection Time: 11/26/21 11:20 PM  Result Value Ref Range   Lipase 68 (H) 11 - 51 U/L    Comment: Performed at [facility] [address] [place], [place] [address]  Lactic acid, plasma     Status: Abnormal   Collection Time: 11/26/21 11:20 PM  Result Value Ref Range   Lactic Acid, Venous 4.1 (HH) 0.5 - 1.9 mmol/L    Comment: CRITICAL RESULT CALLED TO, READ BACK BY AND VERIFIED WITH G. TATE, RN, [id number], 11/27/21, EADEDOKUN Performed at [facility], [place] [address], [place], [place] [address]   Ammonia     Status: Abnormal   Collection Time: 11/26/21 11:20 PM  Result Value Ref Range   Ammonia 169 (H) 9 - 35 umol/L    Comment: Performed at [facility], [place] [address] [place], [place] [address]  Protime-INR     Status: Abnormal   Collection Time: 11/26/21 11:20 PM  Result Value Ref Range   Prothrombin Time 18.5 (H) 11.4 - 15.2 seconds   INR 1.6 (H) 0.8 - 1.2    Comment: (NOTE) INR goal varies based on device and disease states. Performed at [facility], [place] [address] [place], [place] [address]   Magnesium     Status: None   Collection Time: 11/26/21 11:20 PM  Result Value Ref Range   Magnesium 2.1 1.7 - 2.4 mg/dL    Comment: Performed at [facility], [place] [address] [place], [place] [address]  CBG monitoring, ED     Status: Abnormal   Collection Time: 11/27/21  5:19 AM  Result Value Ref Range   Glucose-Capillary 110 (H) 70 - 99 mg/dL    Comment: Glucose reference range applies only to samples  taken after fasting for at least 8 hours.  Urinalysis, Routine w reflex microscopic Urine, Clean Catch     Status: Abnormal   Collection Time: 11/27/21  5:24 AM  Result Value Ref Range   Color, Urine AMBER (A) YELLOW    Comment: BIOCHEMICALS MAY BE AFFECTED BY COLOR   APPearance HAZY (A) CLEAR   Specific Gravity, Urine 1.020 1.005 - 1.030   pH 5.0 5.0 - 8.0   Glucose, UA NEGATIVE NEGATIVE mg/dL   Hgb urine dipstick NEGATIVE NEGATIVE   Bilirubin Urine NEGATIVE NEGATIVE   Ketones, ur NEGATIVE NEGATIVE mg/dL   Protein, ur NEGATIVE NEGATIVE mg/dL   Nitrite NEGATIVE NEGATIVE   Leukocytes,Ua NEGATIVE NEGATIVE    Comment: Performed at [facility] [address] [place], [place] [address]  Sodium, urine, random     Status: None   Collection Time: 11/27/21  5:24 AM  Result Value Ref Range   Sodium, Ur <10 mmol/L    Comment: Performed at [facility] [address], [place], [place] [address]  Creatinine, urine, random     Status: None   Collection Time: 11/27/21  5:24 AM  Result Value Ref Range   Creatinine, Urine 120 mg/dL    Comment: Performed at [facility] [address], [place], [place] [address]  Osmolality, urine     Status: None   Collection Time: 11/27/21  5:24 AM  Result Value Ref Range   Osmolality, Ur 635 300 - 900 mOsm/kg    Comment: REPEATED TO VERIFY Performed at [facility], [place] [place], [place] [address]   CBC with Differential/Platelet     Status:  Abnormal   Collection Time: 11/27/21  5:27 AM  Result Value Ref Range   WBC 14.1 (H) 4.0 - 10.5 K/uL   RBC 2.82 (L) 4.22 - 5.81 MIL/uL   Hemoglobin 10.1 (L) 13.0 - 17.0 g/dL   HCT 27.6 (L) 39.0 - 52.0 %   MCV 97.9 80.0 - 100.0 fL   MCH 35.8 (H) 26.0 - 34.0 pg   MCHC 36.6 (H) 30.0 - 36.0 g/dL   RDW 19.9 (H) 11.5 - 15.5 %   Platelets 147 (L) 150 - 400 K/uL   nRBC 0.0 0.0 - 0.2 %   Neutrophils Relative % 79 %   Neutro Abs 11.3 (H) 1.7 - 7.7 K/uL   Lymphocytes Relative 9 %   Lymphs Abs 1.3 0.7 - 4.0 K/uL   Monocytes Relative 9 %   Monocytes Absolute 1.2 (H) 0.1 - 1.0 K/uL   Eosinophils Relative 1 %   Eosinophils Absolute 0.1 0.0 - 0.5 K/uL   Basophils Relative 1 %   Basophils Absolute 0.1 0.0 - 0.1 K/uL   Immature Granulocytes 1 %   Abs Immature Granulocytes 0.17 (H) 0.00 - 0.07 K/uL    Comment: Performed at [facility], [place] [address], [place], [place] [address]  Comprehensive metabolic panel     Status: Abnormal   Collection Time: 11/27/21  5:27 AM  Result Value Ref Range   Sodium 121 (L) 135 - 145 mmol/L   Potassium 4.7 3.5 - 5.1 mmol/L   Chloride 86 (L) 98 - 111 mmol/L   CO2 24 22 - 32 mmol/L   Glucose, Bld 111 (H) 70 - 99 mg/dL    Comment: Glucose reference range applies only to samples taken after fasting for at least 8 hours.   BUN 33 (H) 6 - 20 mg/dL  Creatinine, Ser 1.16 0.61 - 1.24 mg/dL   Calcium 8.0 (L) 8.9 - 10.3 mg/dL   Total Protein 5.4 (L) 6.5 - 8.1 g/dL   Albumin 1.7 (L) 3.5 - 5.0 g/dL   AST 136 (H) 15 - 41 U/L   ALT 43 0 - 44 U/L   Alkaline Phosphatase 375 (H) 38 - 126 U/L   Total Bilirubin 5.2 (H) 0.3 - 1.2 mg/dL   GFR, Estimated >60 >60 mL/min    Comment: (NOTE) Calculated using the CKD-EPI Creatinine Equation (2021)    Anion gap 11 5 - 15    Comment: Performed at [facility], [place] [address], [place], [place] [address]  Magnesium     Status: None   Collection Time: 11/27/21  5:27 AM  Result Value Ref Range   Magnesium 2.0 1.7 - 2.4  mg/dL    Comment: Performed at [facility] [address] [place], [place] [address]  Protime-INR     Status: Abnormal   Collection Time: 11/27/21  5:27 AM  Result Value Ref Range   Prothrombin Time 18.7 (H) 11.4 - 15.2 seconds   INR 1.6 (H) 0.8 - 1.2    Comment: (NOTE) INR goal varies based on device and disease states. Performed at [facility], [place] [address] [place], [place] [address]   CK     Status: Abnormal   Collection Time: 11/27/21  5:27 AM  Result Value Ref Range   Total CK 27 (L) 49 - 397 U/L    Comment: Performed at [facility], [place] [address] [place], [place] [address]  TSH     Status: None   Collection Time: 11/27/21  5:27 AM  Result Value Ref Range   TSH 1.665 0.350 - 4.500 uIU/mL    Comment: Performed by a 3rd Generation assay with a functional sensitivity of <=0.01 uIU/mL. Performed at [facility], [place] [address], [place], [place] [address]   Osmolality     Status: Abnormal   Collection Time: 11/27/21  5:27 AM  Result Value Ref Range   Osmolality 263 (L) 275 - 295 mOsm/kg    Comment: REPEATED TO VERIFY Performed at [facility], [place] [place], [place] [address]   Lactic acid, plasma     Status: Abnormal   Collection Time: 11/27/21  5:27 AM  Result Value Ref Range   Lactic Acid, Venous 2.9 (HH) 0.5 - 1.9 mmol/L    Comment: CRITICAL VALUE NOTED. VALUE IS CONSISTENT WITH PREVIOUSLY REPORTED/CALLED VALUE Performed at [facility], [place] [address], [place], [place] [address]   Procalcitonin - Baseline     Status: None   Collection Time: 11/27/21  5:27 AM  Result Value Ref Range   Procalcitonin 0.82 ng/mL    Comment:        Interpretation: PCT > 0.5 ng/mL and <= 2 ng/mL: Systemic infection (sepsis) is possible, but other conditions are known to elevate PCT as well. (NOTE)       Sepsis PCT Algorithm           Lower Respiratory Tract                                      Infection PCT Algorithm     ----------------------------     ----------------------------         PCT < 0.25 ng/mL  PCT < 0.10 ng/mL          Strongly encourage             Strongly discourage   discontinuation of antibiotics    initiation of antibiotics    ----------------------------     -----------------------------       PCT 0.25 - 0.50 ng/mL            PCT 0.10 - 0.25 ng/mL               OR       >80% decrease in PCT            Discourage initiation of                                            antibiotics      Encourage discontinuation           of antibiotics    ----------------------------     -----------------------------         PCT >= 0.50 ng/mL              PCT 0.26 - 0.50 ng/mL                AND       <80% decrease in PCT             Encourage initiation of                                             antibiotics       Encourage continuation           of antibiotics    ----------------------------     -----------------------------        PCT >= 0.50 ng/mL                  PCT > 0.50 ng/mL               AND         increase in PCT                  Strongly encourage                                      initiation of antibiotics    Strongly encourage escalation           of antibiotics                                     -----------------------------                                           PCT <= 0.25 ng/mL                                                 OR                                        >  80% decrease in PCT                                      Discontinue / Do not initiate                                             antibiotics  Performed at [facility], [place] [address], [place], [place] [address]   I-Stat venous blood gas, ED     Status: Abnormal   Collection Time: 11/27/21  5:38 AM  Result Value Ref Range   pH, Ven 7.529 (H) 7.25 - 7.43   pCO2, Ven 29.8 (L) 44 - 60 mmHg   pO2, Ven 48 (H) 32 - 45 mmHg   Bicarbonate 24.9 20.0 - 28.0 mmol/L   TCO2 26 22 - 32  mmol/L   O2 Saturation 88 %   Acid-Base Excess 3.0 (H) 0.0 - 2.0 mmol/L   Sodium 120 (L) 135 - 145 mmol/L   Potassium 4.7 3.5 - 5.1 mmol/L   Calcium, Ion 1.06 (L) 1.15 - 1.40 mmol/L   HCT 30.0 (L) 39.0 - 52.0 %   Hemoglobin 10.2 (L) 13.0 - 17.0 g/dL   Sample type VENOUS   CBG monitoring, ED     Status: Abnormal   Collection Time: 11/27/21  1:00 PM  Result Value Ref Range   Glucose-Capillary 162 (H) 70 - 99 mg/dL    Comment: Glucose reference range applies only to samples taken after fasting for at least 8 hours.  CBG monitoring, ED     Status: Abnormal   Collection Time: 11/27/21  4:11 PM  Result Value Ref Range   Glucose-Capillary 201 (H) 70 - 99 mg/dL    Comment: Glucose reference range applies only to samples taken after fasting for at least 8 hours.  Sodium     Status: Abnormal   Collection Time: 11/27/21  4:50 PM  Result Value Ref Range   Sodium 123 (L) 135 - 145 mmol/L    Comment: Performed at [facility], [place] [address], [place], [place] [address]  Ammonia     Status: Abnormal   Collection Time: 11/27/21  4:52 PM  Result Value Ref Range   Ammonia 65 (H) 9 - 35 umol/L    Comment: Performed at [facility], [place] [address] [place], [place] [address]  CBG monitoring, ED     Status: Abnormal   Collection Time: 11/27/21  6:05 PM  Result Value Ref Range   Glucose-Capillary 377 (H) 70 - 99 mg/dL    Comment: Glucose reference range applies only to samples taken after fasting for at least 8 hours.  Glucose, capillary     Status: Abnormal   Collection Time: 11/27/21  8:51 PM  Result Value Ref Range   Glucose-Capillary 289 (H) 70 - 99 mg/dL    Comment: Glucose reference range applies only to samples taken after fasting for at least 8 hours.  Sodium     Status: Abnormal   Collection Time: 11/27/21  9:08 PM  Result Value Ref Range   Sodium 121 (L) 135 - 145 mmol/L    Comment: Performed at [facility], [place] [address] [place], [place] [address]  Glucose,  capillary     Status: Abnormal  Collection Time: 11/28/21  8:24 AM  Result Value Ref Range   Glucose-Capillary 122 (H) 70 - 99 mg/dL    Comment: Glucose reference range applies only to samples taken after fasting for at least 8 hours.  Glucose, capillary     Status: Abnormal   Collection Time: 11/28/21 10:10 AM  Result Value Ref Range   Glucose-Capillary 114 (H) 70 - 99 mg/dL    Comment: Glucose reference range applies only to samples taken after fasting for at least 8 hours.  Glucose, capillary     Status: Abnormal   Collection Time: 11/28/21 12:06 PM  Result Value Ref Range   Glucose-Capillary 196 (H) 70 - 99 mg/dL    Comment: Glucose reference range applies only to samples taken after fasting for at least 8 hours.  Comprehensive metabolic panel     Status: Abnormal   Collection Time: 11/28/21 12:15 PM  Result Value Ref Range   Sodium 123 (L) 135 - 145 mmol/L   Potassium 3.5 3.5 - 5.1 mmol/L   Chloride 94 (L) 98 - 111 mmol/L   CO2 22 22 - 32 mmol/L   Glucose, Bld 231 (H) 70 - 99 mg/dL    Comment: Glucose reference range applies only to samples taken after fasting for at least 8 hours.   BUN 27 (H) 6 - 20 mg/dL   Creatinine, Ser 1.04 0.61 - 1.24 mg/dL   Calcium 7.2 (L) 8.9 - 10.3 mg/dL   Total Protein 5.0 (L) 6.5 - 8.1 g/dL   Albumin 1.8 (L) 3.5 - 5.0 g/dL   AST 131 (H) 15 - 41 U/L   ALT 40 0 - 44 U/L   Alkaline Phosphatase 329 (H) 38 - 126 U/L   Total Bilirubin 6.0 (H) 0.3 - 1.2 mg/dL   GFR, Estimated >60 >60 mL/min    Comment: (NOTE) Calculated using the CKD-EPI Creatinine Equation (2021)    Anion gap 7 5 - 15    Comment: Performed at [facility], [place] [address], [place], [place] [address]  CBC     Status: Abnormal   Collection Time: 11/28/21 12:15 PM  Result Value Ref Range   WBC 11.2 (H) 4.0 - 10.5 K/uL   RBC 2.46 (L) 4.22 - 5.81 MIL/uL   Hemoglobin 8.8 (L) 13.0 - 17.0 g/dL   HCT 24.6 (L) 39.0 - 52.0 %   MCV 100.0 80.0 - 100.0 fL   MCH 35.8 (H) 26.0 - 34.0  pg   MCHC 35.8 30.0 - 36.0 g/dL   RDW 20.2 (H) 11.5 - 15.5 %   Platelets 129 (L) 150 - 400 K/uL   nRBC 0.0 0.0 - 0.2 %    Comment: Performed at [facility], [place] [address], [place], [place] [address]  Lactic acid, plasma     Status: Abnormal   Collection Time: 11/28/21 12:15 PM  Result Value Ref Range   Lactic Acid, Venous 3.4 (HH) 0.5 - 1.9 mmol/L    Comment: CRITICAL VALUE NOTED. VALUE IS CONSISTENT WITH PREVIOUSLY REPORTED/CALLED VALUE Performed at [facility], [place] [address], [place], [place] [address]    *Note: Due to a large number of results and/or encounters for the requested time period, some results have not been displayed. A complete set of results can be found in Results Review.    DG Chest Port 1 View  Result Date: 11/27/2021 CLINICAL DATA:  Vomiting, can not swallow, MS changes. EXAM: PORTABLE CHEST 1 VIEW COMPARISON:  11/23/2021. FINDINGS:  The heart size and mediastinal contours are within normal limits. Lung volumes are low. No consolidation, effusion, or pneumothorax. A right chest port appear stable. No acute osseous abnormality. IMPRESSION: Low lung volumes with no active disease. Electronically Signed   By: Brett Fairy M.D.   On: 11/27/2021 00:43    Assessment/Plan **metastatic cholangiocarcinoma: onc following; repeat staging scan done today re: prognostic purposes  **hyponatremia:  suspect this is multifactorial - has chronic mild hyponatremia related to liver disease with baseline 128-130 recently worsened a few weeks ago in the setting of poor po intake and improved with volume expansion.  He presents under similar circumstances with acute on chronic hyponatremia.  Urine sodium and osm on admission not particularly helpful as could be liver or hypovolemia.  Since admission serum sodium has improved some with a bit of isotonic volume expansion.  Still looks dry.  Will give volume challenge with albumin 25g q6h x 3 doses for now.  I do not think  hypertonic saline is indicated.  Follow I/Os. He's on a clear liquid diet without a current fluid restriction - will continue this for now but consider fluid restriction depending on course.   **ascites:  wife reporting this is a major detractor from QoL and ability to eat - I think small volume 1L would be fine for tonight and we can continue to assess for safety of this in setting of his hyponatremia and hypovolemia.   **lethargy:  multifactorial, recently found to have elevated ammonia/hepatic encephalopathy contributing to picture and is on lactulose now.  Part of it certainly FTT.  I do not think he needs hypertonic saline.   Will follow, page with concerns.   Justin Mend 11/28/2021, 4:30 PM

## 2021-11-28 NOTE — Anesthesia Postprocedure Evaluation (Signed)
Anesthesia Post Note  Patient: Matthew Osborne  Procedure(s) Performed: ESOPHAGOGASTRODUODENOSCOPY (EGD) WITH PROPOFOL BIOPSY ESOPHAGEAL BRUSHING     Patient location during evaluation: PACU Anesthesia Type: MAC Level of consciousness: awake and alert and confused Pain management: pain level controlled Vital Signs Assessment: post-procedure vital signs reviewed and stable Respiratory status: spontaneous breathing, nonlabored ventilation, respiratory function stable and patient connected to nasal cannula oxygen Cardiovascular status: stable and blood pressure returned to baseline Postop Assessment: no apparent nausea or vomiting Anesthetic complications: no   No notable events documented.  Last Vitals:  Vitals:   11/28/21 1015 11/28/21 1206  BP: 111/82 121/73  Pulse: 84 87  Resp: 17   Temp: 36.6 C (!) 36.4 C  SpO2: 98% 99%    Last Pain:  Vitals:   11/28/21 1206  TempSrc: Oral  PainSc:                  Tiajuana Amass

## 2021-11-28 NOTE — Transfer of Care (Signed)
Immediate Anesthesia Transfer of Care Note  Patient: Matthew Osborne  Procedure(s) Performed: ESOPHAGOGASTRODUODENOSCOPY (EGD) WITH PROPOFOL BIOPSY  Patient Location: PACU  Anesthesia Type:MAC  Level of Consciousness: drowsy  Airway & Oxygen Therapy: Patient Spontanous Breathing and Patient connected to face mask oxygen  Post-op Assessment: Report given to RN and Post -op Vital signs reviewed and stable  Post vital signs: Reviewed and stable  Last Vitals:  Vitals Value Taken Time  BP 94/67 11/28/21 0917  Temp 36.6 C 11/28/21 0917  Pulse 87 11/28/21 0921  Resp 18 11/28/21 0921  SpO2 97 % 11/28/21 0921  Vitals shown include unvalidated device data.  Last Pain:  Vitals:   11/28/21 0815  TempSrc: Temporal  PainSc: 2          Complications: No notable events documented.

## 2021-11-28 NOTE — Op Note (Signed)
[facility] Patient Name: Matthew Osborne Procedure Date : 11/28/2021 MRN: [id number] Attending MD: [name] , MD, [id number] Date of Birth: 10/15/1975 CSN: [id number] Age: 46 Admit Type: Inpatient Procedure:                Upper GI endoscopy Indications:              Odynophagia Providers:                [name], MD, Benay Pillow, RN, [facility] Technician, Technician Referring MD:             hospital team Medicines:                Propofol per Anesthesia, Monitored Anesthesia Care Complications:            No immediate complications. Estimated Blood Loss:     Estimated blood loss was minimal. Procedure:                Pre-Anesthesia Assessment:                           - Prior to the procedure, a History and Physical                            was performed, and patient medications and                            allergies were reviewed. The patient's tolerance of                            previous anesthesia was also reviewed. The risks                            and benefits of the procedure and the sedation                            options and risks were discussed with the patient.                            All questions were answered, and informed consent                            was obtained. Prior Anticoagulants: The patient has                            taken no anticoagulant or antiplatelet agents. ASA                            Grade Assessment: III - A patient with severe                            systemic disease. After reviewing the risks and  benefits, the patient was deemed in satisfactory                            condition to undergo the procedure.                           After obtaining informed consent, the endoscope was                            passed under direct vision. Throughout the                            procedure, the patient's blood pressure, pulse, and                             oxygen saturations were monitored continuously. The                            [id number]-[id number] ([id number] Olympus endoscope was introduced                            through the mouth, and advanced to the second part                            of duodenum. The upper GI endoscopy was                            accomplished without difficulty. The patient                            tolerated the procedure well. Scope In: Scope Out: Findings:      LA Grade C (one or more mucosal breaks continuous between tops of 2 or       more mucosal folds, less than 75% circumference) esophagitis with no       bleeding was found in the distal esophagus. Cells for cytology were       obtained by brushing. Estimated blood loss was minimal.      The Z-line was regular and was found 42 cm from the incisors.      Moderate portal hypertensive gastropathy was found in the gastric fundus       and in the gastric body.      Segmental mild inflammation characterized by congestion (edema) and       erythema was found in the gastric antrum. Biopsies were taken with a       cold forceps for histology. Estimated blood loss was minimal.      The cardia and gastric fundus were normal on retroflexion.      The examined duodenum was normal.      Grade I varices were found in the distal esophagus. Impression:               - LA Grade C acute and erosive esophagitis with no                            bleeding. Cells for cytology obtained.                           -  Z-line regular, 42 cm from the incisors.                           - Portal hypertensive gastropathy.                           - Acute gastritis. Biopsied.                           - Normal examined duodenum.                           - Grade I esophageal varices. Recommendation:           - Clear liquid diet.                           - Observe patient's clinical course.                           - Use sucralfate suspension 1 gram PO  QID.                           - Await pathology results. Procedure Code(s):        --- Professional ---                           [phone], Esophagogastroduodenoscopy, flexible,                            transoral; with biopsy, single or multiple Diagnosis Code(s):        --- Professional ---                           R13.10, Dysphagia, unspecified                           K20.80, Other esophagitis without bleeding                           I85.00, Esophageal varices without bleeding                           K29.00, Acute gastritis without bleeding                           K76.6, Portal hypertension                           K31.89, Other diseases of stomach and duodenum CPT copyright 2022 American Medical Association. All rights reserved. The codes documented in this report are preliminary and upon coder review may  be revised to meet current compliance requirements. [name], MD 11/28/2021 9:25:23 AM This report has been signed electronically. Number of Addenda: 0

## 2021-11-29 DIAGNOSIS — K7682 Hepatic encephalopathy: Secondary | ICD-10-CM | POA: Diagnosis not present

## 2021-11-29 DIAGNOSIS — E871 Hypo-osmolality and hyponatremia: Secondary | ICD-10-CM | POA: Diagnosis not present

## 2021-11-29 DIAGNOSIS — G9340 Encephalopathy, unspecified: Secondary | ICD-10-CM | POA: Diagnosis not present

## 2021-11-29 LAB — COMPREHENSIVE METABOLIC PANEL
ALT: 42 U/L (ref 0–44)
AST: 136 U/L — ABNORMAL HIGH (ref 15–41)
Albumin: 2.4 g/dL — ABNORMAL LOW (ref 3.5–5.0)
Alkaline Phosphatase: 257 U/L — ABNORMAL HIGH (ref 38–126)
Anion gap: 9 (ref 5–15)
BUN: 17 mg/dL (ref 6–20)
CO2: 21 mmol/L — ABNORMAL LOW (ref 22–32)
Calcium: 7.4 mg/dL — ABNORMAL LOW (ref 8.9–10.3)
Chloride: 90 mmol/L — ABNORMAL LOW (ref 98–111)
Creatinine, Ser: 0.74 mg/dL (ref 0.61–1.24)
GFR, Estimated: >60 mL/min (ref >60)
Glucose, Bld: 121 mg/dL — ABNORMAL HIGH (ref 70–99)
Potassium: 2.8 mmol/L — ABNORMAL LOW (ref 3.5–5.1)
Sodium: 120 mmol/L — ABNORMAL LOW (ref 135–145)
Total Bilirubin: 6.7 mg/dL — ABNORMAL HIGH (ref 0.3–1.2)
Total Protein: 5.3 g/dL — ABNORMAL LOW (ref 6.5–8.1)

## 2021-11-29 LAB — GLUCOSE, CAPILLARY
Glucose-Capillary: 140 mg/dL — ABNORMAL HIGH (ref 70–99)
Glucose-Capillary: 118 mg/dL — ABNORMAL HIGH (ref 70–99)
Glucose-Capillary: 136 mg/dL — ABNORMAL HIGH (ref 70–99)
Glucose-Capillary: 150 mg/dL — ABNORMAL HIGH (ref 70–99)

## 2021-11-29 LAB — CBC
HCT: 22.2 % — ABNORMAL LOW (ref 39.0–52.0)
Hemoglobin: 8.2 g/dL — ABNORMAL LOW (ref 13.0–17.0)
MCH: 36.6 pg — ABNORMAL HIGH (ref 26.0–34.0)
MCHC: 36.9 g/dL — ABNORMAL HIGH (ref 30.0–36.0)
MCV: 99.1 fL (ref 80.0–100.0)
Platelets: 137 10*3/uL — ABNORMAL LOW (ref 150–400)
RBC: 2.24 MIL/uL — ABNORMAL LOW (ref 4.22–5.81)
RDW: 19.7 % — ABNORMAL HIGH (ref 11.5–15.5)
WBC: 10.7 10*3/uL — ABNORMAL HIGH (ref 4.0–10.5)
nRBC: 0 % (ref 0.0–0.2)

## 2021-11-29 LAB — AMMONIA: Ammonia: 44 umol/L — ABNORMAL HIGH (ref 9–35)

## 2021-11-29 LAB — SURGICAL PATHOLOGY

## 2021-11-29 MED ORDER — POTASSIUM CHLORIDE 20 MEQ PO PACK
40.0000 meq | PACK | Freq: Two times a day (BID) | ORAL | Status: AC
  Administered 2021-11-29 (×2): 40 meq via ORAL
  Filled 2021-11-29 (×2): qty 2

## 2021-11-29 MED ORDER — SODIUM CHLORIDE 0.9% FLUSH
10.0000 mL | Freq: Two times a day (BID) | INTRAVENOUS | Status: DC
  Administered 2021-11-29 – 2021-12-01 (×4): 10 mL
  Administered 2021-12-01: 20 mL
  Administered 2021-12-02 – 2021-12-03 (×3): 10 mL

## 2021-11-29 NOTE — Progress Notes (Addendum)
[facility]  PROGRESS NOTE  Matthew Osborne QMG:[id number] DOB: 01/31/1976 DOA: 11/26/2021 PCP: Susy Frizzle, MD   Brief HPI:   46 y.o. 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 on 11/26/2021 with acute encephalopathy after presenting from home to [facility] ED for evaluation of altered mental status.  Patient was recently hospitalized at [facility] long from 11/23/2021 to 11/19/2021 for dysphagia and was diagnosed with esophageal candidiasis.  He was started on fluconazole on 11/18/2021.  Patient's sodium level was 119 on 11/15/2021 with baseline sodium of 1 28-1 30.This subsequent improved with IV fluids, back to baseline, with final serum sodium level of 128 noted on day of discharge to home from [facility] on approximately 3 more weeks of fluconazole for esophageal candidiasis.   Over the past few days patient has been having increasing somnolence , with poor p.o. intake. Patient is followed by oncology for stage IV cholangiocarcinoma, he also has recurrent ascites requiring frequent paracentesis.  Ultimately he underwent placement of Pleurx catheter on 10/31/2021.  Patient was noted with increasing somnolence and chronic hyponatremia.   Subjective   Patient seen and examined, continues to be lethargic.  Appreciate nephrology input.  CT abdomen/pelvis obtained yesterday showed no progression of disease.   Assessment/Plan:    Acute metabolic encephalopathy -Improved -Likely from hepatic encephalopathy, INR is up to 1.6 -Ammonia level elevated at 169; improved to 65 this morning -Continue lactulose 30 g p.o. 3 times daily -Chest x-ray was unremarkable -CT abdomen obtained today shows multiple hepatic metastasis  Lactic acidosis -Patient presented with lactic of 4.1 -No  overt infection was suspected -Patient was given IV fluids -Repeat lactic acid was down to 2.9 and now again up to 3.4 -Likely from hepatic metastasis  Acute on chronic hyponatremia -Patient has chronic hyponatremia with serum sodium of 128-130 -Presented with sodium of 120; urine osmolality 635, urine sodium less than 10 -Patient has significant ascites;?  Hypervolemic hyponatremia Appreciate nephrology input for hyponatremia -Sodium is again down to 120 this morning -Patient was given albumin 25 g IV every 6 hours for volume expansion downtrending  Malignant ascites -Patient has a peritoneal catheter in place -As per patient wife they were draining about a liter every day at home -Nephrology consulted to help with fluid management  Hypokalemia -Potassium is 2.8 -Potassium being replaced, follow BMP in am  Dysphagia -Patient had no improvement despite course of fluconazole -GI was consulted -Patient underwent EGD this morning which showed erosive esophagitis -Started on sucralfate; await biopsy results  Diabetes mellitus type 2 -Continue insulin pump -CBG well controlled  Stage IV cholangiocarcinoma -Followed by oncology  Acute kidney injury -Resolved with IV hydration   Medications     (feeding supplement) PROSource Plus  30 mL Oral BID BM   Chlorhexidine Gluconate Cloth  6 each Topical Daily   feeding supplement  1 Container Oral 5 X Daily   fluconazole  400 mg Oral Daily   insulin pump   Subcutaneous TID WC, HS, 0200   lactulose  30 g Oral TID   mouth rinse  15 mL Mouth Rinse 4 times per day   pantoprazole (PROTONIX) IV  40 mg Intravenous Q12H   potassium chloride  40 mEq Oral BID   sodium chloride flush  10-40 mL Intracatheter Q12H   sucralfate  1  g Oral TID WC & HS     Data Reviewed:   CBG:  Recent Labs  Lab 11/28/21 1010 11/28/21 1206 11/28/21 1741 11/29/21 0609 11/29/21 1126  GLUCAP 114* 196* 191* 140* 118*    SpO2: 100 %    Vitals:    11/28/21 1015 11/28/21 1206 11/28/21 1743 11/28/21 2055  BP: 111/82 121/73 121/82 118/74  Pulse: 84 87 92 88  Resp: 17  16   Temp: 97.8 F (36.6 C) (!) 97.5 F (36.4 C) 97.8 F (36.6 C) 97.8 F (36.6 C)  TempSrc:  Oral Oral Oral  SpO2: 98% 99% 100% 100%  Weight:      Height:          Data Reviewed:  Basic Metabolic Panel: Recent Labs  Lab 11/26/21 1452 11/26/21 2320 11/27/21 0527 11/27/21 0538 11/27/21 1650 11/27/21 2108 11/28/21 1215 11/29/21 1118  NA 118* 120* 121* 120* 123* 121* 123* 120*  K 4.7 4.8 4.7 4.7  --   --  3.5 2.8*  CL 85* 86* 86*  --   --   --  94* 90*  CO2 '26 22 24  '$ --   --   --  22 21*  GLUCOSE 221* 139* 111*  --   --   --  231* 121*  BUN 32* 33* 33*  --   --   --  27* 17  CREATININE 0.86 1.10 1.16  --   --   --  1.04 0.74  CALCIUM 7.9* 7.8* 8.0*  --   --   --  7.2* 7.4*  MG  --  2.1 2.0  --   --   --   --   --     CBC: Recent Labs  Lab 11/23/21 0856 11/26/21 1452 11/26/21 2320 11/27/21 0527 11/27/21 0538 11/28/21 1215 11/29/21 1118  WBC 17.3* 15.0* 18.5* 14.1*  --  11.2* 10.7*  NEUTROABS 13.3* 12.4* 15.7* 11.3*  --   --   --   HGB 11.3* 11.1* 11.3* 10.1* 10.2* 8.8* 8.2*  HCT 31.7* 30.8* 31.1* 27.6* 30.0* 24.6* 22.2*  MCV 97.2 96.6 97.8 97.9  --  100.0 99.1  PLT 148* 155 174 147*  --  129* 137*    LFT Recent Labs  Lab 11/26/21 1452 11/26/21 2320 11/27/21 0527 11/28/21 1215 11/29/21 1118  AST 121* 147* 136* 131* 136*  ALT 41 48* 43 40 42  ALKPHOS 458* 445* 375* 329* 257*  BILITOT 4.4* 4.7* 5.2* 6.0* 6.7*  PROT 5.5* 6.0* 5.4* 5.0* 5.3*  ALBUMIN 2.2* 1.9* 1.7* 1.8* 2.4*     Antibiotics: Anti-infectives (From admission, onward)    Start     Dose/Rate Route Frequency Ordered Stop   11/27/21 1815  fluconazole (DIFLUCAN) tablet 400 mg        400 mg Oral Daily 11/27/21 1813          DVT prophylaxis: SCDs  Code Status: Full code  Family Communication: Discussed with wife at bedside   CONSULTS oncology   Objective     Physical Examination:   General-appears in no acute distress Heart-S1-S2, regular, no murmur auscultated Lungs-clear to auscultation bilaterally, no wheezing or crackles auscultated Abdomen-soft, distended, mild tenderness to palpation Extremities-no edema in the lower extremities Neuro-alert, oriented x3, no focal deficit noted   Status is: Inpatient:          [place]   [facility] If 7PM-7AM, please contact night-coverage at [email or web address], Office  [phone]   11/29/2021, 2:48 PM  LOS: 2 days

## 2021-11-29 NOTE — Progress Notes (Addendum)
[facility] Progress Note   Subjective:   seen at bedside with wife.  1L ascites drained yesterday.  CT scan showed stable burden of disease but onc rec hospice - no other chemo options, all issues related to malignancy.  Wife wants best QoL for him but doesn't want to abandon all care.  She's understandably overwhelmed.   Objective Vitals:   11/28/21 1015 11/28/21 1206 11/28/21 1743 11/28/21 2055  BP: 111/82 121/73 121/82 118/74  Pulse: 84 87 92 88  Resp: 17  16   Temp: 97.8 F (36.6 C) (!) 97.5 F (36.4 C) 97.8 F (36.6 C) 97.8 F (36.6 C)  TempSrc:  Oral Oral Oral  SpO2: 98% 99% 100% 100%  Weight:      Height:       Physical Exam General: chronically ill appearing, temporal wasting Heart:RRR Lungs:clear Abdomen: soft, LLQ ascites drain with leaking on dressing, abd not tense Extremities:trace dependent LE edema Neuro: sleepy, not really participating in conversation   Additional Objective Labs: Basic Metabolic Panel: Recent Labs  Lab 11/26/21 2320 11/27/21 0527 11/27/21 0538 11/27/21 1650 11/27/21 2108 11/28/21 1215  NA 120* 121* 120* 123* 121* 123*  K 4.8 4.7 4.7  --   --  3.5  CL 86* 86*  --   --   --  94*  CO2 22 24  --   --   --  22  GLUCOSE 139* 111*  --   --   --  231*  BUN 33* 33*  --   --   --  27*  CREATININE 1.10 1.16  --   --   --  1.04  CALCIUM 7.8* 8.0*  --   --   --  7.2*   Liver Function Tests: Recent Labs  Lab 11/26/21 2320 11/27/21 0527 11/28/21 1215  AST 147* 136* 131*  ALT 48* 43 40  ALKPHOS 445* 375* 329*  BILITOT 4.7* 5.2* 6.0*  PROT 6.0* 5.4* 5.0*  ALBUMIN 1.9* 1.7* 1.8*   Recent Labs  Lab 11/26/21 2320  LIPASE 68*   CBC: Recent Labs  Lab 11/23/21 0856 11/26/21 1452 11/26/21 1452 11/26/21 2320 11/27/21 0527 11/27/21 0538 11/28/21 1215  WBC 17.3* 15.0*  --  18.5* 14.1*  --  11.2*  NEUTROABS 13.3* 12.4*  --  15.7* 11.3*  --   --   HGB 11.3* 11.1*   < > 11.3* 10.1* 10.2* 8.8*  HCT 31.7* 30.8*  --   31.1* 27.6* 30.0* 24.6*  MCV 97.2 96.6  --  97.8 97.9  --  100.0  PLT 148* 155  --  174 147*  --  129*   < > = values in this interval not displayed.   Blood Culture    Component Value Date/Time   SDES  11/17/2021 1234    BLOOD LEFT ANTECUBITAL Performed at [facility], [place] [address], [place], [place] [address]    SDES  11/17/2021 1234    BLOOD BLOOD LEFT HAND Performed at [facility], [place] [address], [place], [place] [address]    [place]  11/17/2021 1234    BOTTLES DRAWN AEROBIC AND ANAEROBIC Blood Culture adequate volume Performed at [facility], [place] [address], [place], [place] [address]    [place]  11/17/2021 1234    BOTTLES DRAWN AEROBIC AND ANAEROBIC Blood Culture adequate volume Performed at [facility], [place] [address] [place], [place] [address]    CULT  11/17/2021 1234  NO GROWTH 5 DAYS Performed at [facility], [place] [address] [place], [place] [address]    CULT  11/17/2021 1234    NO GROWTH 5 DAYS Performed at [facility], [place] [address] [place], [place] [address]    REPTSTATUS 11/22/2021 FINAL 11/17/2021 1234   REPTSTATUS 11/22/2021 FINAL 11/17/2021 1234    Cardiac Enzymes: Recent Labs  Lab 11/27/21 0527  CKTOTAL 27*   CBG: Recent Labs  Lab 11/28/21 0824 11/28/21 1010 11/28/21 1206 11/28/21 1741 11/29/21 0609  GLUCAP 122* 114* 196* 191* 140*   Iron Studies: No results for input(s): "IRON", "TIBC", "TRANSFERRIN", "FERRITIN" in the last 72 hours. '@[email or web address]'$ @ Studies/Results: CT ABDOMEN PELVIS W CONTRAST  Result Date: 11/28/2021 CLINICAL DATA:  Cholangiocarcinoma liver metastasis. RIGHT lower quadrant pain EXAM: CT ABDOMEN AND PELVIS WITH CONTRAST TECHNIQUE: Multidetector CT imaging of the abdomen and pelvis was performed using the standard protocol following bolus administration of intravenous contrast. RADIATION DOSE REDUCTION: This exam was  performed according to the departmental dose-optimization program which includes automated exposure control, adjustment of the mA and/or kV according to patient size and/or use of iterative reconstruction technique. CONTRAST:  2m OMNIPAQUE IOHEXOL 350 MG/ML SOLN COMPARISON:  CT 09/06/2021 FINDINGS: Lower chest: RIGHT middle lobe 3 mm nodule (image 24/5) is unchanged. Nodule over the RIGHT hemidiaphragm within the RIGHT lower lobe measures 5 mm (image 46/5) compared to 5 mm. Hepatobiliary: Multiple hypoenhancing masses within the LEFT RIGHT hepatic lobe again noted. Near confluent involvement the LEFT hepatic lobe. Example lesion the RIGHT hepatic lobe measures 4.1 cm (image 18/3) compared to 4.4 cm. Example lesion in the lateral RIGHT hepatic lobe measures 1.9 cm compared to 2.3 cm. Central RIGHT hepatic lobe lesion measures 5.0 cm compared to 4.7 cm (image 34/3). No new hepatic lesions are present. Visually lesion looks very similar. Increase in depth free fluid surrounding the liver. anterior RIGHT hepatic lobe measuring 4 cm from the diaphragm compared to 2 cm on prior. Nodal metastasis in the porta hepatis is similar. Example node measures 2.7 cm compared to 3.1 cm. Pancreas: Metastatic lymph nodes adjacent head of the pancreas. No clear pancreatic lesion. No duct dilatation. Spleen: Normal spleen Adrenals/urinary tract: Adrenal glands and kidneys are normal. The ureters and bladder normal. Stomach/Bowel: Stomach small bowel normal. There is fluid within leaves of the mesentery similar prior. The colon and rectosigmoid colon are normal. Vascular/Lymphatic: Abdominal aorta is normal caliber. No periportal or retroperitoneal adenopathy. No pelvic adenopathy. Reproductive: Prostate unremarkable Other: No peritoneal metastasis identified. Drainage catheter within the peritoneal space with tip along the RIGHT ventral abdominal wall Musculoskeletal: No aggressive osseous lesion. IMPRESSION: 1. small pulmonary  nodules at the lung bases. Recommend attention on follow-up. 2. Multiple hepatic metastasis not appreciably changed from comparison exam. no new or enlarged hepatic metastasis. 3. Interval increase in intraperitoneal free fluid. 4. Stable necrotic periportal lymph nodes. Electronically Signed   By: SSuzy BouchardM.D.   On: 11/28/2021 17:13   Medications:   (feeding supplement) PROSource Plus  30 mL Oral BID BM   Chlorhexidine Gluconate Cloth  6 each Topical Daily   feeding supplement  1 Container Oral 5 X Daily   fluconazole  400 mg Oral Daily   insulin pump   Subcutaneous TID WC, HS, 0200   lactulose  30 g Oral TID   mouth rinse  15 mL Mouth Rinse 4 times per day   pantoprazole (PROTONIX) IV  40 mg Intravenous Q12H   sucralfate  1  g Oral TID WC & HS   Assessment/Plan **metastatic cholangiocarcinoma: onc following; have recommended hospice.     **hyponatremia:  suspect this is multifactorial - has chronic mild hyponatremia related to liver disease with baseline 128-130 recently worsened a few weeks ago in the setting of poor po intake and improved with volume expansion.  He presents under similar circumstances with acute on chronic hyponatremia.  Urine sodium and osm on admission not particularly helpful as could be liver, hypovolemia; with urine Na < 5 SIADH less likely.  Looked intravasc dry yesterday and gave albumin 25g x3 - AM labs haven't been sent - d/w floor RN - will obtain. ADDENDUM:  labs from today resulted: Na 120, K 2.8, Cr 0.74 after volume expansion.  Hold on further volume today - question SIADH.  [place] K.  I had a long discussion with wife today and she mainly wants a good QoL - I am not going to put on a dietary/fluid restriction at this time but at her wish continue to check labs and if serum sodium further trending down depending on [place] may need to.  Would NOT drain ascites today.    **ascites:  wife reporting this is a major detractor from QoL and ability to eat - I  think small volume 1L daily or QoD would be very reasonable.   I would look at labs prior to making decision for today - as long as sodium stable or improved would be ok with 1L today   **lethargy:  multifactorial, recently found to have elevated ammonia/hepatic encephalopathy contributing to picture and is on lactulose now.  Part of it certainly FTT.  I do not think he needs hypertonic saline.    Will follow, page with concerns.   Jannifer Hick MD 11/29/2021, 10:32 AM  [facility] Pager: [phone]

## 2021-11-29 NOTE — Progress Notes (Signed)
IP PROGRESS NOTE  Subjective:   Matthew Osborne is alert this morning.  His wife is at the bedside.  No pain.  Objective: Vital signs in last 24 hours: Blood pressure 118/74, pulse 88, temperature 97.8 F (36.6 C), temperature source Oral, resp. rate 16, height 6' 2"  (1.88 m), weight 194 lb 10.7 oz (88.3 kg), SpO2 100 %.  Intake/Output from previous day: 10/25 0701 - 10/26 0700 In: 1556.3 [I.V.:556.3] Out: -   Physical Exam:  HEENT: Scleral icterus, no thrush at the pharynx or buccal mucosa  Abdomen: Mildly distended with ascites, left abdominal peritoneal drain with a gauze dressing Extremities: No leg edema Neurologic: Alert and oriented  Portacath/PICC-without erythema  Lab Results: Recent Labs    11/28/21 1215 11/29/21 1118  WBC 11.2* 10.7*  HGB 8.8* 8.2*  HCT 24.6* 22.2*  PLT 129* 137*    BMET Recent Labs    11/28/21 1215 11/29/21 1118  NA 123* 120*  K 3.5 2.8*  CL 94* 90*  CO2 22 21*  GLUCOSE 231* 121*  BUN 27* 17  CREATININE 1.04 0.74  CALCIUM 7.2* 7.4*    Lab Results  Component Value Date   CEA1 2.15 01/14/2020   [id number] 522 (H) 11/23/2021    Studies/Results: CT ABDOMEN PELVIS W CONTRAST  Result Date: 11/28/2021 CLINICAL DATA:  Cholangiocarcinoma liver metastasis. RIGHT lower quadrant pain EXAM: CT ABDOMEN AND PELVIS WITH CONTRAST TECHNIQUE: Multidetector CT imaging of the abdomen and pelvis was performed using the standard protocol following bolus administration of intravenous contrast. RADIATION DOSE REDUCTION: This exam was performed according to the departmental dose-optimization program which includes automated exposure control, adjustment of the mA and/or kV according to patient size and/or use of iterative reconstruction technique. CONTRAST:  82m OMNIPAQUE IOHEXOL 350 MG/ML SOLN COMPARISON:  CT 09/06/2021 FINDINGS: Lower chest: RIGHT middle lobe 3 mm nodule (image 24/5) is unchanged. Nodule over the RIGHT hemidiaphragm within the RIGHT lower  lobe measures 5 mm (image 46/5) compared to 5 mm. Hepatobiliary: Multiple hypoenhancing masses within the LEFT RIGHT hepatic lobe again noted. Near confluent involvement the LEFT hepatic lobe. Example lesion the RIGHT hepatic lobe measures 4.1 cm (image 18/3) compared to 4.4 cm. Example lesion in the lateral RIGHT hepatic lobe measures 1.9 cm compared to 2.3 cm. Central RIGHT hepatic lobe lesion measures 5.0 cm compared to 4.7 cm (image 34/3). No new hepatic lesions are present. Visually lesion looks very similar. Increase in depth free fluid surrounding the liver. anterior RIGHT hepatic lobe measuring 4 cm from the diaphragm compared to 2 cm on prior. Nodal metastasis in the porta hepatis is similar. Example node measures 2.7 cm compared to 3.1 cm. Pancreas: Metastatic lymph nodes adjacent head of the pancreas. No clear pancreatic lesion. No duct dilatation. Spleen: Normal spleen Adrenals/urinary tract: Adrenal glands and kidneys are normal. The ureters and bladder normal. Stomach/Bowel: Stomach small bowel normal. There is fluid within leaves of the mesentery similar prior. The colon and rectosigmoid colon are normal. Vascular/Lymphatic: Abdominal aorta is normal caliber. No periportal or retroperitoneal adenopathy. No pelvic adenopathy. Reproductive: Prostate unremarkable Other: No peritoneal metastasis identified. Drainage catheter within the peritoneal space with tip along the RIGHT ventral abdominal wall Musculoskeletal: No aggressive osseous lesion. IMPRESSION: 1. small pulmonary nodules at the lung bases. Recommend attention on follow-up. 2. Multiple hepatic metastasis not appreciably changed from comparison exam. no new or enlarged hepatic metastasis. 3. Interval increase in intraperitoneal free fluid. 4. Stable necrotic periportal lymph nodes. Electronically Signed   By: SNicole Kindred  Leonia Reeves M.D.   On: 11/28/2021 17:13    Medications: I have reviewed the patient's current  medications.  Assessment/Plan: Cholangiocarcinoma  multiple liver masses and abdominal lymphadenopathy CTs 01/13/2020-rounded hypodense mass appears to arise from the pancreas neck, multiple rim-enhancing masses in the liver, primarily left liver with segmental dilation of the left lobe Intermatic bile ducts, ill-defined hypodensity the central liver with effacement of the left portal vein, enlarged portacaval and retroperitoneal lymph nodes Ultrasound-guided biopsy of the left liver lesion 01/19/2020-adenocarcinoma, cytokeratin 7+, MSS, tumor mutation burden 1, IDH1 R132C MRI abdomen 01/31/2020-poorly marginated central liver mass, multiple smaller similar satellite liver masses, extrinsic mass-effect at the biliary hilum with intrahepatic biliary ductal dilatation throughout the left liver with mild Intermatic dilatation the superior right liver, no pancreas mass or ductal dilatation, normal spleen size, numerous enlarged enhancing lymph nodes at the porta hepatis, peripancreatic, portacaval, aortocaval, left periaortic chains Cycle 1 gemcitabine/cisplatin 02/04/2020 Cycle 2 gemcitabine/cisplatin 02/28/2020 Cycle 3 gemcitabine/cisplatin 03/20/2020 CT abdomen/pelvis 03/31/2020-mild decrease in central liver tumor, mild decrease in size of liver metastases and upper abdominal adenopathy, new splenomegaly Cycle 4 gemcitabine/cisplatin 04/10/2020 Cycle 5 gemcitabine/cisplatin 05/01/2020 Cycle 6 gemcitabine/cisplatin plus durvalumab 05/22/2020 CTs 06/06/2020- dominant central liver mass mildly enlarged, other liver lesions and abdominal adenopathy is stable, no evidence of metastatic disease to the chest Cycle 7 gemcitabine/cisplatin plus Durvalumab 06/12/2020 Cycle 8 gemcitabine/cisplatin plus Durvalumab 06/30/2020 Cycle 9 gemcitabine/cisplatin plus Durvalumab 07/31/2020 CT abdomen/pelvis 08/20/2020-decreased size of dominant central liver mass, slight increase in size and stable additional liver lesions, stable  portacaval node Cycle 10 gemcitabine/cisplatin plus Durvalumab 08/21/2020 Cycle 11 gemcitabine/cisplatin plus Durvalumab 09/11/2020 Cycle 12 gemcitabine/cisplatin plus Durvalumab 10/02/2020 Cycle 13 gemcitabine/cisplatin plus Durvalumab 10/23/2020 CT abdomen/pelvis 11/10/2020-no change in dominant central liver mass and additional liver lesions, stable upper retroperitoneal adenopathy Cycle 14 gemcitabine/cisplatin plus Durvalumab 11/13/2020 Cycle 15 gemcitabine/cisplatin plus Durvalumab 12/04/2020 Cycle 15 gemcitabine/cisplatin plus Durvalumab 01/08/2021 Cycle 16 gemcitabine/cisplatin plus Durvalumab 01/30/2021-cisplatin held from day 1 and day 8 secondary to neuropathy CTs 02/15/2021-slight increase in size of central liver mass, progressive metastatic lesions in the right liver, stable to slightly decreased periportal lymph nodes, progression of splenomegaly, chronically thrombosed left portal vein Cycle 1 FOLFOX 03/05/2021 Cycle 2 FOLFOX 03/19/2021, oxaliplatin dose reduced due to thrombocytopenia Cycle 3 FOLFOX 04/02/2021 Cycle 4 FOLFOX 04/16/2021 Cycle 5 FOLFOX 04/30/2021 CTs 05/10/2021-new and enlarging liver metastases, chronic left lobe biliary dilatation, chronic left portal vein thrombosis, stable porta hepatis adenopathy, splenomegaly, no evidence of metastatic disease to the chest Ivosidenib 05/21/2021 CT 07/13/2021-increase in size of periportal node, stable retroperitoneal nodes, dominant left hepatic mass-stable, increased necrosis of other liver lesions, some have increased in size Ivosidenib continued CT abdomen/pelvis 09/06/2021-progression of ascites, progression of liver metastases, stable necrotic lymphadenopathy in the periportal region Ivosedinib discontinued 09/07/2021 Cycle 1 FOLFIRI 09/24/2021 Cycle 2 held on 10/10/2021 due to neutropenia, elevated bilirubin Cycle 2 FOLFIRI 10/22/2021 Cycle 3 FOLFIRI 11/05/2021 CT abdomen/pelvis 11/28/2021-small pulmonary nodules at the lung bases, multiple  hepatic metastases-unchanged, stable necrotic periportal nodes   Cough-likely related to diaphragmatic irritation from #1 Anorexia/weight loss Hypercalcemia-likely hypercalcemia malignancy, status post intravenous hydration and Zometa 01/14/2020 Diabetes Hyperlipidemia Hypertension History of peripheral neuropathy secondary to diabetes Severe back pain following Fulphila-oxycodone prescribed Thrombocytopenia secondary to chemotherapy-gemcitabine held with day 1 cycle 8 and then dose reduced COVID-19 infection 07/22/2020, 12/25/2020 Cisplatin induced peripheral neuropathy Admission 11/15/2021 with oropharyngeal candidiasis, mucositis?,  Neutropenia, and multiple electrolyte abnormalities Admission 11/28/2021 with hyponatremia, hepatic encephalopathy, nausea/vomiting  Matthew Osborne is more alert today.  I  discussed the CT findings with Matthew Osborne and his wife.  The CT reveals no evidence of disease progression, but he has persistent evidence of extensive metastatic disease in the liver.  He continues to have a poor performance status.  I do not recommend further systemic therapy.  I recommend hospice care.  Matthew Osborne does not wish to enroll in hospice at present.  We discussed CODE STATUS.  He will continue to think about this.  I recommend backing down on the frequent blood draws, but he and his wife would like to continue aggressive management of the hyponatremia.  I appreciate the consult from the nephrology service.  The upper endoscopy yesterday revealed esophagitis.  He is now on Carafate.   Recommendations: Antiacid therapy and diet as recommended by gastroenterology Continue discussions with Matthew Osborne and his wife regarding hospice care and CODE STATUS Drain peritoneal fluid for comfort I will check on him 11/30/2021, outpatient follow-up will be scheduled at the Cancer center.    LOS: 2 days   Betsy Coder, MD   11/29/2021, 1:55 PM

## 2021-11-29 NOTE — Progress Notes (Addendum)
[facility] Gastroenterology Progress Note  Matthew Osborne 46 y.o. [birth year]   Subjective: Patient lying in bed sleeping. Wife reports he drank 9 Boost yesterday without N/V. She reports coughing at home with Carafate slurry. Nonbloody loose stools on Lactulose.  Objective: Vital signs: Vitals:   11/28/21 1743 11/28/21 2055  BP: 121/82 118/74  Pulse: 92 88  Resp: 16   Temp: 97.8 F (36.6 C) 97.8 F (36.6 C)  SpO2: 100% 100%    Physical Exam: Gen: somnolent, no acute distress, cachectic HEENT: +icteric sclera CV: RRR Chest: CTA B Abd: diffuse tenderness with guarding, soft, nondistended, +BS Ext: no edema  Lab Results: Recent Labs    11/26/21 2320 11/27/21 0527 11/27/21 0538 11/27/21 1650 11/27/21 2108 11/28/21 1215  NA 120* 121* 120*   < > 121* 123*  K 4.8 4.7 4.7  --   --  3.5  CL 86* 86*  --   --   --  94*  CO2 22 24  --   --   --  22  GLUCOSE 139* 111*  --   --   --  231*  BUN 33* 33*  --   --   --  27*  CREATININE 1.10 1.16  --   --   --  1.04  CALCIUM 7.8* 8.0*  --   --   --  7.2*  MG 2.1 2.0  --   --   --   --    < > = values in this interval not displayed.   Recent Labs    11/27/21 0527 11/28/21 1215  AST 136* 131*  ALT 43 40  ALKPHOS 375* 329*  BILITOT 5.2* 6.0*  PROT 5.4* 5.0*  ALBUMIN 1.7* 1.8*   Recent Labs    11/26/21 2320 11/27/21 0527 11/27/21 0538 11/28/21 1215  WBC 18.5* 14.1*  --  11.2*  NEUTROABS 15.7* 11.3*  --   --   HGB 11.3* 10.1* 10.2* 8.8*  HCT 31.1* 27.6* 30.0* 24.6*  MCV 97.8 97.9  --  100.0  PLT 174 147*  --  129*      Assessment/Plan: Metastatic cholangiocarcinoma to the liver with failure to thrive and chronic hyponatremia: ulcerative esophagitis on EGD yesterday without typical findings of Candidiasis. Question herpes ulcers vs Candida vs acid reflux. Biopsies of stomach and esophageal brushings pending. Continue clear liquid diet. Continue Sucralfate. Nutrition will continue to worsen with minimal intake. Oncology  recommending comfort care measures. No new GI recs.   [name] 11/29/2021, 10:23 AM  Questions please call [phone] Patient ID: Matthew Osborne, male   DOB: 04-Jul-1975, 46 y.o.   MRN: [id number]

## 2021-11-30 ENCOUNTER — Encounter: Payer: Self-pay | Admitting: *Deleted

## 2021-11-30 ENCOUNTER — Encounter (HOSPITAL_COMMUNITY): Payer: Self-pay | Admitting: Gastroenterology

## 2021-11-30 DIAGNOSIS — K7682 Hepatic encephalopathy: Secondary | ICD-10-CM | POA: Diagnosis not present

## 2021-11-30 DIAGNOSIS — E871 Hypo-osmolality and hyponatremia: Secondary | ICD-10-CM | POA: Diagnosis not present

## 2021-11-30 DIAGNOSIS — G9340 Encephalopathy, unspecified: Secondary | ICD-10-CM | POA: Diagnosis not present

## 2021-11-30 LAB — GLUCOSE, CAPILLARY
Glucose-Capillary: 115 mg/dL — ABNORMAL HIGH (ref 70–99)
Glucose-Capillary: 163 mg/dL — ABNORMAL HIGH (ref 70–99)
Glucose-Capillary: 162 mg/dL — ABNORMAL HIGH (ref 70–99)
Glucose-Capillary: 230 mg/dL — ABNORMAL HIGH (ref 70–99)
Glucose-Capillary: 165 mg/dL — ABNORMAL HIGH (ref 70–99)

## 2021-11-30 LAB — COMPREHENSIVE METABOLIC PANEL
ALT: 69 U/L — ABNORMAL HIGH (ref 0–44)
AST: 271 U/L — ABNORMAL HIGH (ref 15–41)
Albumin: 2.2 g/dL — ABNORMAL LOW (ref 3.5–5.0)
Alkaline Phosphatase: 270 U/L — ABNORMAL HIGH (ref 38–126)
Anion gap: 8 (ref 5–15)
BUN: 16 mg/dL (ref 6–20)
CO2: 20 mmol/L — ABNORMAL LOW (ref 22–32)
Calcium: 7.2 mg/dL — ABNORMAL LOW (ref 8.9–10.3)
Chloride: 92 mmol/L — ABNORMAL LOW (ref 98–111)
Creatinine, Ser: 0.79 mg/dL (ref 0.61–1.24)
GFR, Estimated: >60 mL/min (ref >60)
Glucose, Bld: 118 mg/dL — ABNORMAL HIGH (ref 70–99)
Potassium: 3.4 mmol/L — ABNORMAL LOW (ref 3.5–5.1)
Sodium: 120 mmol/L — ABNORMAL LOW (ref 135–145)
Total Bilirubin: 6.2 mg/dL — ABNORMAL HIGH (ref 0.3–1.2)
Total Protein: 5.1 g/dL — ABNORMAL LOW (ref 6.5–8.1)

## 2021-11-30 MED ORDER — ENSURE ENLIVE PO LIQD
237.0000 mL | Freq: Every day | ORAL | Status: DC
  Administered 2021-11-30 – 2021-12-03 (×12): 237 mL via ORAL

## 2021-11-30 MED ORDER — LACTULOSE 10 GM/15ML PO SOLN
30.0000 g | Freq: Every day | ORAL | Status: DC
  Administered 2021-11-30 – 2021-12-02 (×2): 30 g via ORAL
  Filled 2021-11-30 (×4): qty 45

## 2021-11-30 MED ORDER — POTASSIUM CHLORIDE 20 MEQ PO PACK: 40.0000 meq | PACK | Freq: Once | ORAL | Status: DC

## 2021-11-30 NOTE — Progress Notes (Signed)
[facility] Gastroenterology Progress Note  Matthew Osborne 46 y.o. 1976/01/06   Subjective: Tolerating Boost. More awake this morning. Wife in room.  Objective: Vital signs: Vitals:   11/30/21 0418 11/30/21 0923  BP: 108/64 (!) 97/58  Pulse: 97 95  Resp: 18 16  Temp: 99.3 F (37.4 C) 98.5 F (36.9 C)  SpO2: 99% 98%    Physical Exam: Gen: lethargic, cachetic, no acute distress  HEENT: +icteric sclera CV: RRR Chest: CTA B Abd: diffuse tenderness with guarding, soft, nondistended, +BS Ext: no edema  Lab Results: Recent Labs    11/29/21 1118 11/30/21 0327  NA 120* 120*  K 2.8* 3.4*  CL 90* 92*  CO2 21* 20*  GLUCOSE 121* 118*  BUN 17 16  CREATININE 0.74 0.79  CALCIUM 7.4* 7.2*   Recent Labs    11/29/21 1118 11/30/21 0327  AST 136* 271*  ALT 42 69*  ALKPHOS 257* 270*  BILITOT 6.7* 6.2*  PROT 5.3* 5.1*  ALBUMIN 2.4* 2.2*   Recent Labs    11/28/21 1215 11/29/21 1118  WBC 11.2* 10.7*  HGB 8.8* 8.2*  HCT 24.6* 22.2*  MCV 100.0 99.1  PLT 129* 137*      Assessment/Plan: Metastatic cholangiocarcinoma to the liver with failure to thrive and chronic hyponatremia: Gastric biopsies negative for H. Pylori. Esophageal brushings pending. Full liquid diet. Supportive care. No further GI recs. Will sign off. Call if questions.   [name] 11/30/2021, 11:58 AM  Questions please call [phone] Patient ID: Matthew Osborne, male   DOB: 1975/05/04, 46 y.o.   MRN: [id number]

## 2021-11-30 NOTE — Progress Notes (Signed)
IP PROGRESS NOTE  Subjective:   Matthew Osborne is alert this morning.  His wife is at the bedside.  No pain or nausea.  He declined lactulose yesterday due to frequent bowel movements.  Objective: Vital signs in last 24 hours: Blood pressure (!) 97/58, pulse 95, temperature 98.5 F (36.9 C), temperature source Oral, resp. rate 16, height _0  (1.88 m), weight 194 lb 10.7 oz (88.3 kg), SpO2 98 %.  Intake/Output from previous day: 10/26 0701 - 10/27 0700 In: 1797 [P.O.:1797] Out: 452 [Urine:450; Stool:2]  Physical Exam:  HEENT: Scleral icterus, no thrush   Abdomen: Mildly distended with ascites, left abdominal peritoneal drain with a saturated gauze dressing Extremities: Trace lower leg/ankle edema bilaterally Neurologic: Alert and oriented  Portacath/PICC-without erythema  Lab Results: Recent Labs    11/28/21 1215 11/29/21 1118  WBC 11.2* 10.7*  HGB 8.8* 8.2*  HCT 24.6* 22.2*  PLT 129* 137*    BMET Recent Labs    11/29/21 1118 11/30/21 0327  NA 120* 120*  K 2.8* 3.4*  CL 90* 92*  CO2 21* 20*  GLUCOSE 121* 118*  BUN 17 16  CREATININE 0.74 0.79  CALCIUM 7.4* 7.2*    Lab Results  Component Value Date   CEA1 2.15 01/14/2020   EGB151 522 (H) 11/23/2021    Studies/Results: CT ABDOMEN PELVIS W CONTRAST  Result Date: 11/28/2021 CLINICAL DATA:  Cholangiocarcinoma liver metastasis. RIGHT lower quadrant pain EXAM: CT ABDOMEN AND PELVIS WITH CONTRAST TECHNIQUE: Multidetector CT imaging of the abdomen and pelvis was performed using the standard protocol following bolus administration of intravenous contrast. RADIATION DOSE REDUCTION: This exam was performed according to the departmental dose-optimization program which includes automated exposure control, adjustment of the mA and/or kV according to patient size and/or use of iterative reconstruction technique. CONTRAST:  91m OMNIPAQUE IOHEXOL 350 MG/ML SOLN COMPARISON:  CT 09/06/2021 FINDINGS: Lower chest: RIGHT middle  lobe 3 mm nodule (image 24/5) is unchanged. Nodule over the RIGHT hemidiaphragm within the RIGHT lower lobe measures 5 mm (image 46/5) compared to 5 mm. Hepatobiliary: Multiple hypoenhancing masses within the LEFT RIGHT hepatic lobe again noted. Near confluent involvement the LEFT hepatic lobe. Example lesion the RIGHT hepatic lobe measures 4.1 cm (image 18/3) compared to 4.4 cm. Example lesion in the lateral RIGHT hepatic lobe measures 1.9 cm compared to 2.3 cm. Central RIGHT hepatic lobe lesion measures 5.0 cm compared to 4.7 cm (image 34/3). No new hepatic lesions are present. Visually lesion looks very similar. Increase in depth free fluid surrounding the liver. anterior RIGHT hepatic lobe measuring 4 cm from the diaphragm compared to 2 cm on prior. Nodal metastasis in the porta hepatis is similar. Example node measures 2.7 cm compared to 3.1 cm. Pancreas: Metastatic lymph nodes adjacent head of the pancreas. No clear pancreatic lesion. No duct dilatation. Spleen: Normal spleen Adrenals/urinary tract: Adrenal glands and kidneys are normal. The ureters and bladder normal. Stomach/Bowel: Stomach small bowel normal. There is fluid within leaves of the mesentery similar prior. The colon and rectosigmoid colon are normal. Vascular/Lymphatic: Abdominal aorta is normal caliber. No periportal or retroperitoneal adenopathy. No pelvic adenopathy. Reproductive: Prostate unremarkable Other: No peritoneal metastasis identified. Drainage catheter within the peritoneal space with tip along the RIGHT ventral abdominal wall Musculoskeletal: No aggressive osseous lesion. IMPRESSION: 1. small pulmonary nodules at the lung bases. Recommend attention on follow-up. 2. Multiple hepatic metastasis not appreciably changed from comparison exam. no new or enlarged hepatic metastasis. 3. Interval increase in intraperitoneal free fluid. 4.  Stable necrotic periportal lymph nodes. Electronically Signed   By: Suzy Bouchard M.D.   On:  11/28/2021 17:13    Medications: I have reviewed the patient's current medications.  Assessment/Plan: Cholangiocarcinoma  multiple liver masses and abdominal lymphadenopathy CTs 01/13/2020-rounded hypodense mass appears to arise from the pancreas neck, multiple rim-enhancing masses in the liver, primarily left liver with segmental dilation of the left lobe Intermatic bile ducts, ill-defined hypodensity the central liver with effacement of the left portal vein, enlarged portacaval and retroperitoneal lymph nodes Ultrasound-guided biopsy of the left liver lesion 01/19/2020-adenocarcinoma, cytokeratin 7+, MSS, tumor mutation burden 1, IDH1 R132C MRI abdomen 01/31/2020-poorly marginated central liver mass, multiple smaller similar satellite liver masses, extrinsic mass-effect at the biliary hilum with intrahepatic biliary ductal dilatation throughout the left liver with mild Intermatic dilatation the superior right liver, no pancreas mass or ductal dilatation, normal spleen size, numerous enlarged enhancing lymph nodes at the porta hepatis, peripancreatic, portacaval, aortocaval, left periaortic chains Cycle 1 gemcitabine/cisplatin 02/04/2020 Cycle 2 gemcitabine/cisplatin 02/28/2020 Cycle 3 gemcitabine/cisplatin 03/20/2020 CT abdomen/pelvis 03/31/2020-mild decrease in central liver tumor, mild decrease in size of liver metastases and upper abdominal adenopathy, new splenomegaly Cycle 4 gemcitabine/cisplatin 04/10/2020 Cycle 5 gemcitabine/cisplatin 05/01/2020 Cycle 6 gemcitabine/cisplatin plus durvalumab 05/22/2020 CTs 06/06/2020- dominant central liver mass mildly enlarged, other liver lesions and abdominal adenopathy is stable, no evidence of metastatic disease to the chest Cycle 7 gemcitabine/cisplatin plus Durvalumab 06/12/2020 Cycle 8 gemcitabine/cisplatin plus Durvalumab 06/30/2020 Cycle 9 gemcitabine/cisplatin plus Durvalumab 07/31/2020 CT abdomen/pelvis 08/20/2020-decreased size of dominant central liver  mass, slight increase in size and stable additional liver lesions, stable portacaval node Cycle 10 gemcitabine/cisplatin plus Durvalumab 08/21/2020 Cycle 11 gemcitabine/cisplatin plus Durvalumab 09/11/2020 Cycle 12 gemcitabine/cisplatin plus Durvalumab 10/02/2020 Cycle 13 gemcitabine/cisplatin plus Durvalumab 10/23/2020 CT abdomen/pelvis 11/10/2020-no change in dominant central liver mass and additional liver lesions, stable upper retroperitoneal adenopathy Cycle 14 gemcitabine/cisplatin plus Durvalumab 11/13/2020 Cycle 15 gemcitabine/cisplatin plus Durvalumab 12/04/2020 Cycle 15 gemcitabine/cisplatin plus Durvalumab 01/08/2021 Cycle 16 gemcitabine/cisplatin plus Durvalumab 01/30/2021-cisplatin held from day 1 and day 8 secondary to neuropathy CTs 02/15/2021-slight increase in size of central liver mass, progressive metastatic lesions in the right liver, stable to slightly decreased periportal lymph nodes, progression of splenomegaly, chronically thrombosed left portal vein Cycle 1 FOLFOX 03/05/2021 Cycle 2 FOLFOX 03/19/2021, oxaliplatin dose reduced due to thrombocytopenia Cycle 3 FOLFOX 04/02/2021 Cycle 4 FOLFOX 04/16/2021 Cycle 5 FOLFOX 04/30/2021 CTs 05/10/2021-new and enlarging liver metastases, chronic left lobe biliary dilatation, chronic left portal vein thrombosis, stable porta hepatis adenopathy, splenomegaly, no evidence of metastatic disease to the chest Ivosidenib 05/21/2021 CT 07/13/2021-increase in size of periportal node, stable retroperitoneal nodes, dominant left hepatic mass-stable, increased necrosis of other liver lesions, some have increased in size Ivosidenib continued CT abdomen/pelvis 09/06/2021-progression of ascites, progression of liver metastases, stable necrotic lymphadenopathy in the periportal region Ivosedinib discontinued 09/07/2021 Cycle 1 FOLFIRI 09/24/2021 Cycle 2 held on 10/10/2021 due to neutropenia, elevated bilirubin Cycle 2 FOLFIRI 10/22/2021 Cycle 3 FOLFIRI 11/05/2021 CT  abdomen/pelvis 11/28/2021-small pulmonary nodules at the lung bases, multiple hepatic metastases-unchanged, stable necrotic periportal nodes   Cough-likely related to diaphragmatic irritation from #1 Anorexia/weight loss Hypercalcemia-likely hypercalcemia malignancy, status post intravenous hydration and Zometa 01/14/2020 Diabetes Hyperlipidemia Hypertension History of peripheral neuropathy secondary to diabetes Severe back pain following Fulphila-oxycodone prescribed Thrombocytopenia secondary to chemotherapy-gemcitabine held with day 1 cycle 8 and then dose reduced COVID-19 infection 07/22/2020, 12/25/2020 Cisplatin induced peripheral neuropathy Admission 11/15/2021 with oropharyngeal candidiasis, mucositis?,  Neutropenia, and multiple electrolyte abnormalities Admission 11/28/2021 with hyponatremia,  hepatic encephalopathy, nausea/vomiting  Matthew Osborne appears stable.  He would like to go home.  He does not wish to enroll in hospice.  He would like to remain on a full CODE STATUS for now. I decreased the lactulose to once daily.  He would like to advance his diet.  Outpatient follow-up will be scheduled at the Cancer center for next week.  We will follow recommendations from nephrology regarding management of the hyponatremia and the peritoneal drain.  The anemia is likely secondary to chemotherapy, chronic disease, and frequent phlebotomy.  No gross bleeding has been reported.  Recommendations: Decrease lactulose Continue discussions with Matthew Osborne and his wife regarding hospice care and CODE STATUS Drain peritoneal fluid for comfort as recommended by nephrology Outpatient follow-up will be scheduled next week at the cancer center Advance diet Please call oncology as needed    LOS: 3 days   Betsy Coder, MD   11/30/2021, 9:48 AM

## 2021-11-30 NOTE — Evaluation (Signed)
Physical Therapy Evaluation Patient Details Name: Matthew Osborne MRN: [id number] DOB: 1975/08/26 Today's Date: 11/30/2021  History of Present Illness  46 y/o male presented to ED on 11/26/21 for emesis and low sodium as well as AMS with fall in bathroom. Recent admission at [facility] 10/12-10/16 for diet aphasia with dx of esophageal candidiasis. Admitted for acute metabolic encephalopathy suspected hepatic in nature. 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, chronic hyponatremia, T2DM  Clinical Impression  Patient admitted with the above. PTA, patient lives with wife who assists him with ADLs and mobility with intermittent use of RW. Asleep on arrival but easily wakes with name and agreeable to therapy. Patient easily fatigued with minimal activity and requires rest breaks after each transition. Able to come to EOB with minA but required modA+2 to stand from EOB with RW. Posterior bias in standing requiring minA+2 to take sidesteps at EOB. Patient will benefit from skilled PT services during acute stay to address listed deficits. Recommend HHPT at discharge to maximize functional mobility and decrease burden of care.  *Would benefit from Palliative consult for holistic approach and to discuss goals of care.      Recommendations for follow up therapy are one component of a multi-disciplinary discharge planning process, led by the attending physician.  Recommendations may be updated based on patient status, additional functional criteria and insurance authorization.  Follow Up Recommendations Home health PT      Assistance Recommended at Discharge Frequent or constant Supervision/Assistance  Patient can return home with the following  A lot of help with walking and/or transfers;A lot of help with bathing/dressing/bathroom;Assistance with cooking/housework;Assist for transportation;Help with stairs or ramp for entrance;Direct supervision/assist for medications management;Direct  supervision/assist for financial management    Equipment Recommendations BSC/3in1  Recommendations for Other Services  Other (comment) (Palliative Consult)    Functional Status Assessment Patient has had a recent decline in their functional status and demonstrates the ability to make significant improvements in function in a reasonable and predictable amount of time.     Precautions / Restrictions Precautions Precautions: Fall Precaution Comments: hx of falls Restrictions Weight Bearing Restrictions: No      Mobility  Bed Mobility Overal bed mobility: Needs Assistance Bed Mobility: Supine to Sit, Sit to Supine     Supine to sit: Min assist Sit to supine: Min assist   General bed mobility comments: assist for trunk elevation and LE management back into bed    Transfers Overall transfer level: Needs assistance Equipment used: Rolling Latravious Levitt (2 wheels) Transfers: Sit to/from Stand Sit to Stand: Mod assist, +2 physical assistance           General transfer comment: requires up to modA+2 to stand from slightly elevated EOB. Cues for hand placement prior to standing. Initially with posterior lean upon standing. Swaying in static standing with B UE support    Ambulation/Gait Ambulation/Gait assistance: Min assist, +2 physical assistance, +2 safety/equipment Gait Distance (Feet): 3 Feet Assistive device: Rolling Roen Macgowan (2 wheels) Gait Pattern/deviations: Step-to pattern Gait velocity: decreased     General Gait Details: sidesteps towards HOB with RW and minA+2 for balance as patient tends to lean posterior with steps  Stairs            Wheelchair Mobility    Modified Rankin (Stroke Patients Only)       Balance Overall balance assessment: Needs assistance Sitting-balance support: Feet supported, Bilateral upper extremity supported Sitting balance-Leahy Scale: Fair     Standing balance support: Bilateral  upper extremity supported, Reliant on assistive  device for balance Standing balance-Leahy Scale: Poor Standing balance comment: posterior lean in standing and swaying with static standing                             Pertinent Vitals/Pain Pain Assessment Pain Assessment: No/denies pain    Home Living Family/patient expects to be discharged to:: Private residence Living Arrangements: Spouse/significant other;Children Available Help at Discharge: Family;Available 24 hours/day Type of Home: House Home Access: Stairs to enter Entrance Stairs-Rails: Chemical engineer of Steps: 3   Home Layout: Able to live on main level with bedroom/bathroom Home Equipment: Conservation officer, nature (2 wheels);Shower seat      Prior Function Prior Level of Function : Needs assist;History of Falls (last six months)             Mobility Comments: hx of falls, uses RW intermittently. Patient states wife assists with mobility ADLs Comments: wife assists with bathing and other ADLs     Hand Dominance        Extremity/Trunk Assessment   Upper Extremity Assessment Upper Extremity Assessment: Defer to OT evaluation    Lower Extremity Assessment Lower Extremity Assessment: Generalized weakness    Cervical / Trunk Assessment Cervical / Trunk Assessment: Normal  Communication   Communication: No difficulties  Cognition Arousal/Alertness: Lethargic Behavior During Therapy: WFL for tasks assessed/performed Overall Cognitive Status: Difficult to assess                                 General Comments: Patient reports getting confused at times. Answers all questions appropriately. Easily fatigued after eating lunch so difficult to fully assess cognition        General Comments      Exercises     Assessment/Plan    PT Assessment Patient needs continued PT services  PT Problem List Decreased strength;Decreased activity tolerance;Decreased mobility;Decreased balance;Cardiopulmonary status limiting  activity       PT Treatment Interventions DME instruction;Gait training;Functional mobility training;Therapeutic activities;Balance training;Therapeutic exercise;Patient/family education;Wheelchair mobility training    PT Goals (Current goals can be found in the Care Plan section)  Acute Rehab PT Goals Patient Stated Goal: did not state PT Goal Formulation: With patient Time For Goal Achievement: 12/14/21 Potential to Achieve Goals: Fair    Frequency Min 3X/week     Co-evaluation PT/OT/SLP Co-Evaluation/Treatment: Yes Reason for Co-Treatment: For patient/therapist safety;To address functional/ADL transfers PT goals addressed during session: Mobility/safety with mobility;Balance         AM-PAC PT "6 Clicks" Mobility  Outcome Measure Help needed turning from your back to your side while in a flat bed without using bedrails?: A Little Help needed moving from lying on your back to sitting on the side of a flat bed without using bedrails?: A Little Help needed moving to and from a bed to a chair (including a wheelchair)?: Total Help needed standing up from a chair using your arms (e.g., wheelchair or bedside chair)?: Total Help needed to walk in hospital room?: Total Help needed climbing 3-5 steps with a railing? : Total 6 Click Score: 10    End of Session   Activity Tolerance: Patient limited by fatigue Patient left: in bed;with call bell/phone within reach;with family/visitor present Nurse Communication: Mobility status PT Visit Diagnosis: Unsteadiness on feet (R26.81);Other abnormalities of gait and mobility (R26.89);Muscle weakness (generalized) (M62.81);History of falling (Z91.81)  Time: 4255-2589 PT Time Calculation (min) (ACUTE ONLY): 22 min   Charges:   PT Evaluation $PT Eval Moderate Complexity: 1 Mod          Alexis Mizuno A. Gilford Rile PT, DPT Acute Rehabilitation Services Office [phone]   Linna Hoff 11/30/2021, 2:35 PM

## 2021-11-30 NOTE — Progress Notes (Addendum)
[facility] Progress Note   Subjective:   No new issues.  Mother in law and sister in law bedside.    Objective Vitals:   11/29/21 1644 11/29/21 2154 11/30/21 0418 11/30/21 0923  BP: 122/70 125/68 108/64 (!) 97/58  Pulse: 85 88 97 95  Resp: '16 19 18 16  '$ Temp:  98.3 F (36.8 C) 99.3 F (37.4 C) 98.5 F (36.9 C)  TempSrc:  Oral Oral Oral  SpO2: 100% 100% 99% 98%  Weight:      Height:       Physical Exam General: chronically ill appearing, temporal wasting  Heart:RRR Lungs:clear Abdomen: soft, LLQ ascites drain with leaking on dressing, abd more distended than yesterday Extremities:trace dependent LE edema Neuro: drowsy but awakens to voice  Additional Objective Labs: Basic Metabolic Panel: Recent Labs  Lab 11/28/21 1215 11/29/21 1118 11/30/21 0327  NA 123* 120* 120*  K 3.5 2.8* 3.4*  CL 94* 90* 92*  CO2 22 21* 20*  GLUCOSE 231* 121* 118*  BUN 27* 17 16  CREATININE 1.04 0.74 0.79  CALCIUM 7.2* 7.4* 7.2*    Liver Function Tests: Recent Labs  Lab 11/28/21 1215 11/29/21 1118 11/30/21 0327  AST 131* 136* 271*  ALT 40 42 69*  ALKPHOS 329* 257* 270*  BILITOT 6.0* 6.7* 6.2*  PROT 5.0* 5.3* 5.1*  ALBUMIN 1.8* 2.4* 2.2*    Recent Labs  Lab 11/26/21 2320  LIPASE 68*    CBC: Recent Labs  Lab 11/26/21 1452 11/26/21 1452 11/26/21 2320 11/27/21 0527 11/27/21 0538 11/28/21 1215 11/29/21 1118  WBC 15.0*   < > 18.5* 14.1*  --  11.2* 10.7*  NEUTROABS 12.4*  --  15.7* 11.3*  --   --   --   HGB 11.1*   < > 11.3* 10.1* 10.2* 8.8* 8.2*  HCT 30.8*  --  31.1* 27.6* 30.0* 24.6* 22.2*  MCV 96.6  --  97.8 97.9  --  100.0 99.1  PLT 155   < > 174 147*  --  129* 137*   < > = values in this interval not displayed.    Blood Culture    Component Value Date/Time   SDES  11/17/2021 1234    BLOOD LEFT ANTECUBITAL Performed at [facility], [place] [address], [place], [place] [address]    SDES  11/17/2021 1234    BLOOD BLOOD LEFT  HAND Performed at [facility], [place] [address], [place], [place] [address]    [place]  11/17/2021 1234    BOTTLES DRAWN AEROBIC AND ANAEROBIC Blood Culture adequate volume Performed at [facility], [place] [address], [place], [place] [address]    [place]  11/17/2021 1234    BOTTLES DRAWN AEROBIC AND ANAEROBIC Blood Culture adequate volume Performed at [facility], [place] [address], [place], [place] [address]    CULT  11/17/2021 1234    NO GROWTH 5 DAYS Performed at [place] [address] [place], [place] [address]    CULT  11/17/2021 1234    NO GROWTH 5 DAYS Performed at [facility], [place] [address], [place],  [address]    REPTSTATUS 11/22/2021 FINAL 11/17/2021 1234   REPTSTATUS 11/22/2021 FINAL 11/17/2021 1234    Cardiac Enzymes: Recent Labs  Lab 11/27/21 0527  CKTOTAL 27*    CBG: Recent Labs  Lab 11/29/21 1627 11/29/21 2145 11/30/21 0235 11/30/21 0742 11/30/21 1200  GLUCAP 136* 150* 115* 163* 162*  Iron Studies: No results for input(s): "IRON", "TIBC", "TRANSFERRIN", "FERRITIN" in the last 72 hours. '@[email or web address]'$ @ Studies/Results: CT ABDOMEN PELVIS W CONTRAST  Result Date: 11/28/2021 CLINICAL DATA:  Cholangiocarcinoma liver metastasis. RIGHT lower quadrant pain EXAM: CT ABDOMEN AND PELVIS WITH CONTRAST TECHNIQUE: Multidetector CT imaging of the abdomen and pelvis was performed using the standard protocol following bolus administration of intravenous contrast. RADIATION DOSE REDUCTION: This exam was performed according to the departmental dose-optimization program which includes automated exposure control, adjustment of the mA and/or kV according to patient size and/or use of iterative reconstruction technique. CONTRAST:  73m OMNIPAQUE IOHEXOL 350 MG/ML SOLN COMPARISON:  CT 09/06/2021 FINDINGS: Lower chest: RIGHT middle lobe 3 mm nodule (image 24/5) is unchanged. Nodule over the  RIGHT hemidiaphragm within the RIGHT lower lobe measures 5 mm (image 46/5) compared to 5 mm. Hepatobiliary: Multiple hypoenhancing masses within the LEFT RIGHT hepatic lobe again noted. Near confluent involvement the LEFT hepatic lobe. Example lesion the RIGHT hepatic lobe measures 4.1 cm (image 18/3) compared to 4.4 cm. Example lesion in the lateral RIGHT hepatic lobe measures 1.9 cm compared to 2.3 cm. Central RIGHT hepatic lobe lesion measures 5.0 cm compared to 4.7 cm (image 34/3). No new hepatic lesions are present. Visually lesion looks very similar. Increase in depth free fluid surrounding the liver. anterior RIGHT hepatic lobe measuring 4 cm from the diaphragm compared to 2 cm on prior. Nodal metastasis in the porta hepatis is similar. Example node measures 2.7 cm compared to 3.1 cm. Pancreas: Metastatic lymph nodes adjacent head of the pancreas. No clear pancreatic lesion. No duct dilatation. Spleen: Normal spleen Adrenals/urinary tract: Adrenal glands and kidneys are normal. The ureters and bladder normal. Stomach/Bowel: Stomach small bowel normal. There is fluid within leaves of the mesentery similar prior. The colon and rectosigmoid colon are normal. Vascular/Lymphatic: Abdominal aorta is normal caliber. No periportal or retroperitoneal adenopathy. No pelvic adenopathy. Reproductive: Prostate unremarkable Other: No peritoneal metastasis identified. Drainage catheter within the peritoneal space with tip along the RIGHT ventral abdominal wall Musculoskeletal: No aggressive osseous lesion. IMPRESSION: 1. small pulmonary nodules at the lung bases. Recommend attention on follow-up. 2. Multiple hepatic metastasis not appreciably changed from comparison exam. no new or enlarged hepatic metastasis. 3. Interval increase in intraperitoneal free fluid. 4. Stable necrotic periportal lymph nodes. Electronically Signed   By: SSuzy BouchardM.D.   On: 11/28/2021 17:13   Medications:   (feeding supplement)  PROSource Plus  30 mL Oral BID BM   Chlorhexidine Gluconate Cloth  6 each Topical Daily   feeding supplement  1 Container Oral 5 X Daily   fluconazole  400 mg Oral Daily   insulin pump   Subcutaneous TID WC, HS, 0200   lactulose  30 g Oral Daily   mouth rinse  15 mL Mouth Rinse 4 times per day   pantoprazole (PROTONIX) IV  40 mg Intravenous Q12H   potassium chloride  40 mEq Oral Once   sodium chloride flush  10-40 mL Intracatheter Q12H   sucralfate  1 g Oral TID WC & HS   Assessment/Plan **metastatic cholangiocarcinoma: onc following; have recommended hospice.  No further treatment options available.    **hyponatremia:  suspect this is multifactorial - has chronic mild hyponatremia related to liver disease with baseline 128-130 recently worsened in the setting of poor po intake related to odynophagia.   Hyponatremia worse with albumin infusion - SIADH vs liver failure + low solute intake with recent liquid diet.  At this point limited  therapeutic options - wouldn't use salt tabs as would ^ ascites, no vaptan given liver issues.  Wife generally wishes for comfort for him - po intake as desired so no fluid restriction, drain ascites for his comfort but not ready to stop checking labs yet.  His sodium is 120 and stable c/w yesterday, no changes today; ok with 1L paracentesis today.    **ascites:  wife reporting this is a major detractor from QoL and ability to eat - I think small volume 1L daily or QoD would be very reasonable. He's more distended today so would be ok with 1L.  **lethargy:  multifactorial, recently found to have elevated ammonia/hepatic encephalopathy contributing to picture and is on lactulose now.  Part of it certainly FTT.  I do not think he needs hypertonic saline.   **Odynophagia:  s/p EGD this week - per GI   I don't have much to offer in this case -- they are understandably struggling with this terminal situation.  Long conversations with wife past few days -- she  expresses mainly she wants him to be comfortable and I've encouraged her to consider hospice care as it seems most consistent with their wishes in light of no further treatment options.  The peritoneal drain of 1L on days he's feeling distended is very reasonable and I don't thinks this low volume drain with cause a marked difference in his sodium.  Liberalize diet as able but probably should avoid huge sodium loads.  I don't think he needs to stay in the hospital if otherwise ready to go.  He's going to follow up with Dr. Benay Spice in clinic next week - if I can be of any assistance please call me [phone].  Jannifer Hick MD 11/30/2021, 1:43 PM  [facility] Pager: [phone]

## 2021-11-30 NOTE — Evaluation (Signed)
Occupational Therapy Evaluation Patient Details Name: Matthew Osborne MRN: [id number] DOB: 07/03/1975 Today's Date: 11/30/2021   History of Present Illness 46 y/o male presented to ED on 11/26/21 for emesis and low sodium as well as AMS with fall in bathroom. Recent admission at [facility] 10/12-10/16 for diet aphasia with dx of esophageal candidiasis. Admitted for acute metabolic encephalopathy suspected hepatic in nature. 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, chronic hyponatremia, T2DM   Clinical Impression   Prior to this admission, patient undergoing chemotherapy, and requiring assist with ADLs and transfers. Patient endorses frequent falls, floaters in his vision, and reports "getting confused more often". Currently, easily fatigued with minimal activity and requires rest breaks after each transition. Able to come to EOB with minA but required modA+2 to stand from EOB with RW. Posterior bias in standing requiring minA+2 to take sidesteps at EOB. Patient also with increased assist for ADLs due to significant fatigue. OT recommending HHOT to promote increased functional independence.  *Would benefit from Palliative consult for holistic approach and to discuss goals of care.         Recommendations for follow up therapy are one component of a multi-disciplinary discharge planning process, led by the attending physician.  Recommendations may be updated based on patient status, additional functional criteria and insurance authorization.   Follow Up Recommendations  Home health OT    Assistance Recommended at Discharge Frequent or constant Supervision/Assistance  Patient can return home with the following A lot of help with walking and/or transfers;A lot of help with bathing/dressing/bathroom;Assistance with cooking/housework;Direct supervision/assist for medications management;Direct supervision/assist for financial management;Assist for transportation;Help with stairs or ramp  for entrance    Functional Status Assessment  Patient has had a recent decline in their functional status and demonstrates the ability to make significant improvements in function in a reasonable and predictable amount of time.  Equipment Recommendations  Other (comment) (will continue to assess)    Recommendations for Other Services       Precautions / Restrictions Precautions Precautions: Fall Precaution Comments: hx of falls Restrictions Weight Bearing Restrictions: No      Mobility Bed Mobility Overal bed mobility: Needs Assistance Bed Mobility: Supine to Sit, Sit to Supine     Supine to sit: Min assist Sit to supine: Min assist   General bed mobility comments: assist for trunk elevation and LE management back into bed    Transfers Overall transfer level: Needs assistance Equipment used: Rolling walker (2 wheels) Transfers: Sit to/from Stand Sit to Stand: Mod assist, +2 physical assistance           General transfer comment: requires up to modA+2 to stand from slightly elevated EOB. Cues for hand placement prior to standing. Initially with posterior lean upon standing. Swaying in static standing with B UE support      Balance Overall balance assessment: Needs assistance Sitting-balance support: Feet supported, Bilateral upper extremity supported Sitting balance-Leahy Scale: Fair     Standing balance support: Bilateral upper extremity supported, Reliant on assistive device for balance Standing balance-Leahy Scale: Poor Standing balance comment: posterior lean in standing and swaying with static standing                           ADL either performed or assessed with clinical judgement   ADL Overall ADL's : Needs assistance/impaired Eating/Feeding: Set up;Sitting   Grooming: Set up;Sitting   Upper Body Bathing: Minimal assistance;Sitting   Lower Body  Bathing: Maximal assistance;Sitting/lateral leans;Sit to/from stand   Upper Body Dressing  : Maximal assistance;Sitting   Lower Body Dressing: Maximal assistance;Sitting/lateral leans;Sit to/from stand   Toilet Transfer: Moderate assistance;+2 for physical assistance;+2 for safety/equipment;Stand-pivot           Functional mobility during ADLs: Moderate assistance;Cueing for sequencing;Cueing for safety General ADL Comments: Patient presenting with decreased activity tolerance, cognitive impairments, and increased assist for all aspects of care     Vision Baseline Vision/History: 0 No visual deficits Ability to See in Adequate Light: 0 Adequate Patient Visual Report: Other (comment) (States that he has been having "floaters" in his vision frequently)       Perception     Praxis      Pertinent Vitals/Pain Pain Assessment Pain Assessment: No/denies pain     Hand Dominance     Extremity/Trunk Assessment Upper Extremity Assessment Upper Extremity Assessment: Generalized weakness   Lower Extremity Assessment Lower Extremity Assessment: Defer to PT evaluation   Cervical / Trunk Assessment Cervical / Trunk Assessment: Normal   Communication Communication Communication: No difficulties   Cognition Arousal/Alertness: Lethargic Behavior During Therapy: WFL for tasks assessed/performed Overall Cognitive Status: Difficult to assess                                 General Comments: Patient reports getting confused at times. Answers all questions appropriately. Easily fatigued after eating lunch so difficult to fully assess cognition     General Comments       Exercises     Shoulder Instructions      Home Living Family/patient expects to be discharged to:: Private residence Living Arrangements: Spouse/significant other;Children Available Help at Discharge: Family;Available 24 hours/day Type of Home: House Home Access: Stairs to enter [employer] of Steps: 3 Entrance Stairs-Rails: Left;Right Home Layout: Able to live on main  level with bedroom/bathroom     Bathroom Shower/Tub: Walk-in shower         Home Equipment: Conservation officer, nature (2 wheels);Shower seat          Prior Functioning/Environment Prior Level of Function : Needs assist;History of Falls (last six months)             Mobility Comments: hx of falls, uses RW intermittently. Patient states wife assists with mobility ADLs Comments: wife assists with bathing and other ADLs        OT Problem List: Decreased strength;Decreased activity tolerance;Impaired balance (sitting and/or standing);Decreased coordination;Decreased cognition;Decreased safety awareness;Increased edema      OT Treatment/Interventions: Therapeutic exercise;Self-care/ADL training;Energy conservation;Cognitive remediation/compensation;Patient/family education;Balance training;DME and/or AE instruction    OT Goals(Current goals can be found in the care plan section) Acute Rehab OT Goals Patient Stated Goal: to feel better OT Goal Formulation: With patient Time For Goal Achievement: 12/14/21 Potential to Achieve Goals: Fair ADL Goals Pt Will Perform Lower Body Bathing: with min assist Pt Will Perform Lower Body Dressing: with min assist Pt Will Transfer to Toilet: ambulating;with min assist Pt Will Perform Toileting - Clothing Manipulation and hygiene: with min guard assist  OT Frequency: Min 2X/week    Co-evaluation   Reason for Co-Treatment: For patient/therapist safety;To address functional/ADL transfers PT goals addressed during session: Mobility/safety with mobility;Balance OT goals addressed during session: ADL's and self-care      AM-PAC OT "6 Clicks" Daily Activity     Outcome Measure Help from another person eating meals?: A Little Help from another person taking care of  personal grooming?: A Little Help from another person toileting, which includes using toliet, bedpan, or urinal?: A Lot Help from another person bathing (including washing, rinsing,  drying)?: A Lot Help from another person to put on and taking off regular upper body clothing?: A Little Help from another person to put on and taking off regular lower body clothing?: A Lot 6 Click Score: 15   End of Session Equipment Utilized During Treatment: Rolling walker (2 wheels) Nurse Communication: Mobility status  Activity Tolerance: Patient limited by fatigue;Patient limited by lethargy Patient left: in bed;with call bell/phone within reach;with family/visitor present  OT Visit Diagnosis: Unsteadiness on feet (R26.81);Other abnormalities of gait and mobility (R26.89);Repeated falls (R29.6);Muscle weakness (generalized) (M62.81);History of falling (Z91.81);Adult, failure to thrive (R62.7);Other symptoms and signs involving cognitive function                Time: 1344-1405 OT Time Calculation (min): 21 min Charges:  OT General Charges $OT Visit: 1 Visit OT Evaluation $OT Eval Moderate Complexity: 1 Mod  Corinne Ports E. Hammad Finkler, OTR/L Acute Rehabilitation Services [phone]   Ascencion Dike 11/30/2021, 3:27 PM

## 2021-11-30 NOTE — Progress Notes (Signed)
Oncology Discharge Planning Note  [facility] Address: [address] [place], [place], [place] [address] Hours of Operation:  Nena Polio, Monday - Friday  Clinic Contact Information:  [phone]) [phone]  Oncology Care Team: Medical Oncologist:  Benay Spice  Patient Details: Name:  Matthew Osborne, Matthew Osborne MRN:   [id number] DOB:   Jan 28, 1976 Reason for Current Admission: '@PPROB'$ @  Discharge Planning Narrative: Notification of admission received by inpatient team for [facility].  Discharge follow-up appointments for oncology are current and available on the AVS and MyChart.   Upon discharge from the hospital, hematology/oncology's post discharge plan of care for the outpatient setting is: Dr Benay Spice December 07, 2021 at 1:40 pm [facility] [address] [place], [place] [address] [phone]   Matthew Osborne will be called within two business days after discharge to review hematology/oncology's plan of care for full understanding.    Outpatient Oncology Specific Care Only: Oncology appointment transportation needs addressed?:  not applicable Oncology medication management for symptom management addressed?:  yes Chemo Alert Card reviewed?:  yes Immunotherapy Alert Card reviewed?:  not applicable

## 2021-11-30 NOTE — Progress Notes (Addendum)
[facility]  PROGRESS NOTE  Matthew Osborne VHQ:[id number] DOB: 28-May-1975 DOA: 11/26/2021 PCP: Susy Frizzle, MD   Brief HPI:   46 y.o. 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 on 11/26/2021 with acute encephalopathy after presenting from home to [facility] ED for evaluation of altered mental status.  Patient was recently hospitalized at [facility] long from 11/23/2021 to 11/19/2021 for dysphagia and was diagnosed with esophageal candidiasis.  He was started on fluconazole on 11/18/2021.  Patient's sodium level was 119 on 11/15/2021 with baseline sodium of 1 28-1 30.This subsequent improved with IV fluids, back to baseline, with final serum sodium level of 128 noted on day of discharge to home from [facility] on approximately 3 more weeks of fluconazole for esophageal candidiasis.   Over the past few days patient has been having increasing somnolence , with poor p.o. intake. Patient is followed by oncology for stage IV cholangiocarcinoma, he also has recurrent ascites requiring frequent paracentesis.  Ultimately he underwent placement of Pleurx catheter on 10/31/2021.  Patient was noted with increasing somnolence and chronic hyponatremia.   Subjective   Patient seen and examined, he is much more alert today.  Sodium is still down at 120   Assessment/Plan:    Acute metabolic encephalopathy -Improved -Likely from hepatic encephalopathy, INR is up to 1.6 -Ammonia level elevated at 169; ammonia level checked yesterday is down to 44 -Dose of lactulose changed to 30 g once a day as per patient family request  -Chest x-ray was unremarkable -CT abdomen obtained today shows multiple hepatic metastasis  Lactic acidosis -Patient presented with lactic of 4.1 -No overt infection was  suspected -Patient was given IV fluids -Repeat lactic acid was down to 2.9 and now again up to 3.4 -Likely from hepatic metastasis  Acute on chronic hyponatremia -Patient has chronic hyponatremia with serum sodium of 128-130 -Presented with sodium of 120; urine osmolality 635, urine sodium less than 10 -Patient has significant ascites;?  Hypervolemic hyponatremia Appreciate nephrology input for hyponatremia -Sodium is again down to 120 this morning -Patient was given albumin 25 g IV every 6 hours for volume expansion -Nephrology has signed off, sodium will likely stay around 120 due to multifactorial causes  Malignant ascites -Patient has a peritoneal catheter in place -As per patient wife they were draining about a liter every day at home -Nephrology consulted to help with fluid management -Recommend to drain 1 L of ascitic fluid as needed for comfort  Hypokalemia -Potassium is 3.4 -Replace potassium and follow BMP in am  Dysphagia -Patient had no improvement despite course of fluconazole -GI was consulted -Patient underwent EGD this morning which showed erosive esophagitis; H. pylori negative -Started on sucralfate; await biopsy results  Diabetes mellitus type 2 -Continue insulin pump -CBG well controlled  Stage IV cholangiocarcinoma -Followed by oncology  Acute kidney injury -Resolved with IV hydration  Goal of care -Patient wife wants him to be full code -Not ready for hospice -Oncology has recommended hospice, had long discussion with patient and his wife at bedside -Encouraged to consider hospice at home  Medications     (feeding supplement) PROSource Plus  30 mL Oral BID BM   Chlorhexidine Gluconate Cloth  6 each Topical Daily   feeding supplement  1 Container Oral 5 X Daily   fluconazole  400  mg Oral Daily   insulin pump   Subcutaneous TID WC, HS, 0200   lactulose  30 g Oral Daily   mouth rinse  15 mL Mouth Rinse 4 times per day   pantoprazole  (PROTONIX) IV  40 mg Intravenous Q12H   potassium chloride  40 mEq Oral Once   sodium chloride flush  10-40 mL Intracatheter Q12H   sucralfate  1 g Oral TID WC & HS     Data Reviewed:   CBG:  Recent Labs  Lab 11/29/21 1627 11/29/21 2145 11/30/21 0235 11/30/21 0742 11/30/21 1200  GLUCAP 136* 150* 115* 163* 162*    SpO2: 98 %    Vitals:   11/29/21 1644 11/29/21 2154 11/30/21 0418 11/30/21 0923  BP: 122/70 125/68 108/64 (!) 97/58  Pulse: 85 88 97 95  Resp: '16 19 18 16  '$ Temp:  98.3 F (36.8 C) 99.3 F (37.4 C) 98.5 F (36.9 C)  TempSrc:  Oral Oral Oral  SpO2: 100% 100% 99% 98%  Weight:      Height:          Data Reviewed:  Basic Metabolic Panel: Recent Labs  Lab 11/26/21 2320 11/27/21 0527 11/27/21 0538 11/27/21 1650 11/27/21 2108 11/28/21 1215 11/29/21 1118 11/30/21 0327  NA 120* 121* 120* 123* 121* 123* 120* 120*  K 4.8 4.7 4.7  --   --  3.5 2.8* 3.4*  CL 86* 86*  --   --   --  94* 90* 92*  CO2 22 24  --   --   --  22 21* 20*  GLUCOSE 139* 111*  --   --   --  231* 121* 118*  BUN 33* 33*  --   --   --  27* 17 16  CREATININE 1.10 1.16  --   --   --  1.04 0.74 0.79  CALCIUM 7.8* 8.0*  --   --   --  7.2* 7.4* 7.2*  MG 2.1 2.0  --   --   --   --   --   --     CBC: Recent Labs  Lab 11/26/21 1452 11/26/21 2320 11/27/21 0527 11/27/21 0538 11/28/21 1215 11/29/21 1118  WBC 15.0* 18.5* 14.1*  --  11.2* 10.7*  NEUTROABS 12.4* 15.7* 11.3*  --   --   --   HGB 11.1* 11.3* 10.1* 10.2* 8.8* 8.2*  HCT 30.8* 31.1* 27.6* 30.0* 24.6* 22.2*  MCV 96.6 97.8 97.9  --  100.0 99.1  PLT 155 174 147*  --  129* 137*    LFT Recent Labs  Lab 11/26/21 2320 11/27/21 0527 11/28/21 1215 11/29/21 1118 11/30/21 0327  AST 147* 136* 131* 136* 271*  ALT 48* 43 40 42 69*  ALKPHOS 445* 375* 329* 257* 270*  BILITOT 4.7* 5.2* 6.0* 6.7* 6.2*  PROT 6.0* 5.4* 5.0* 5.3* 5.1*  ALBUMIN 1.9* 1.7* 1.8* 2.4* 2.2*     Antibiotics: Anti-infectives (From admission, onward)     Start     Dose/Rate Route Frequency Ordered Stop   11/27/21 1815  fluconazole (DIFLUCAN) tablet 400 mg        400 mg Oral Daily 11/27/21 1813          DVT prophylaxis: SCDs  Code Status: Full code  Family Communication: Discussed with wife at bedside   CONSULTS oncology   Objective    Physical Examination:    General: Appears lethargic Cardiovascular: S1-S2, regular, no murmur auscultated Respiratory: Clear to auscultation bilaterally Abdomen: Soft,  distended, peritoneal drain in place, draining constantly Extremities: No edema in the lower extremities Neurologic: Alert, oriented x3, no focal deficit noted   Status is: Inpatient:          Oswald Hillock   [facility] If 7PM-7AM, please contact night-coverage at [email or web address], Office  [phone]   11/30/2021, 2:29 PM  LOS: 3 days

## 2021-12-01 ENCOUNTER — Other Ambulatory Visit: Payer: Self-pay

## 2021-12-01 DIAGNOSIS — E871 Hypo-osmolality and hyponatremia: Secondary | ICD-10-CM | POA: Diagnosis not present

## 2021-12-01 DIAGNOSIS — K7682 Hepatic encephalopathy: Secondary | ICD-10-CM | POA: Diagnosis not present

## 2021-12-01 DIAGNOSIS — G9340 Encephalopathy, unspecified: Secondary | ICD-10-CM | POA: Diagnosis not present

## 2021-12-01 LAB — GLUCOSE, CAPILLARY
Glucose-Capillary: 131 mg/dL — ABNORMAL HIGH (ref 70–99)
Glucose-Capillary: 139 mg/dL — ABNORMAL HIGH (ref 70–99)
Glucose-Capillary: 255 mg/dL — ABNORMAL HIGH (ref 70–99)

## 2021-12-01 LAB — RENAL FUNCTION PANEL
Albumin: 2 g/dL — ABNORMAL LOW (ref 3.5–5.0)
Anion gap: 11 (ref 5–15)
BUN: 29 mg/dL — ABNORMAL HIGH (ref 6–20)
CO2: 19 mmol/L — ABNORMAL LOW (ref 22–32)
Calcium: 7.4 mg/dL — ABNORMAL LOW (ref 8.9–10.3)
Chloride: 90 mmol/L — ABNORMAL LOW (ref 98–111)
Creatinine, Ser: 1.1 mg/dL (ref 0.61–1.24)
GFR, Estimated: >60 mL/min (ref >60)
Glucose, Bld: 120 mg/dL — ABNORMAL HIGH (ref 70–99)
Phosphorus: 3.3 mg/dL (ref 2.5–4.6)
Potassium: 3.7 mmol/L (ref 3.5–5.1)
Sodium: 120 mmol/L — ABNORMAL LOW (ref 135–145)

## 2021-12-01 NOTE — Progress Notes (Signed)
[facility]  PROGRESS NOTE  Matthew Osborne [id number]:[id number] DOB: [birth year] DOA: 11/26/2021 PCP: Susy Frizzle, MD   Brief HPI:   46 y.o. male with medical history significant for stage IV cholangiocarcinoma with metastatic disease to the liver resulting in liver failure, chronic hyponatremia with baseline serum sodium of 128- 130, type 2 diabetes mellitus, who is admitted to [facility] on 11/26/2021 with acute encephalopathy after presenting from home to [facility] ED for evaluation of altered mental status.  Patient was recently hospitalized at [facility] long from 11/23/2021 to 11/19/2021 for dysphagia and was diagnosed with esophageal candidiasis.  He was started on fluconazole on 11/18/2021.  Patient's sodium level was 119 on 11/15/2021 with baseline sodium of 1 28-1 30.This subsequent improved with IV fluids, back to baseline, with final serum sodium level of 128 noted on day of discharge to home from [facility] on approximately 3 more weeks of fluconazole for esophageal candidiasis.   Over the past few days patient has been having increasing somnolence , with poor p.o. intake. Patient is followed by oncology for stage IV cholangiocarcinoma, he also has recurrent ascites requiring frequent paracentesis.  Ultimately he underwent placement of Pleurx catheter on 10/31/2021.  Patient was noted with increasing somnolence and chronic hyponatremia.   Subjective   Patient seen and examined, very somnolent.  He received morphine earlier today.  Sodium still 120.   Assessment/Plan:    Acute metabolic encephalopathy -Improved -Likely from hepatic encephalopathy, INR is up to 1.6 -Ammonia level elevated at 169; ammonia level checked yesterday is down to 44 -Dose of lactulose changed to 30 g once a day as per patient family request  -Chest x-ray was unremarkable -CT abdomen obtained today shows multiple hepatic metastasis  Lactic acidosis -Patient presented with lactic of 4.1 -No overt  infection was suspected -Patient was given IV fluids -Repeat lactic acid was down to 2.9 and now again up to 3.4 -Likely from hepatic metastasis  Acute on chronic hyponatremia -Patient has chronic hyponatremia with serum sodium of 128-130 -Presented with sodium of 120; urine osmolality 635, urine sodium less than 10 -Patient has significant ascites;?  Hypervolemic hyponatremia Appreciate nephrology input for hyponatremia -Sodium is again down to 120 this morning -Patient was given albumin 25 g IV every 6 hours for volume expansion -Nephrology has signed off, sodium will likely stay around 120 due to multifactorial causes  Malignant ascites -Patient has a peritoneal catheter in place -As per patient wife they were draining about a liter every day at home -Nephrology consulted to help with fluid management -Recommend to drain 1 L of ascitic fluid as needed for comfort  Hypokalemia -Replete  Dysphagia -Patient had no improvement despite course of fluconazole -GI was consulted -Patient underwent EGD this morning which showed erosive esophagitis; H. pylori negative -Started on sucralfate; await biopsy results  Diabetes mellitus type 2 -Continue insulin pump -CBG well controlled  Stage IV cholangiocarcinoma -Followed by oncology  Acute kidney injury -Resolved with IV hydration  Goal of care -Patient wife wants him to be full code -Not ready for hospice -Oncology has recommended hospice, had long discussion with patient and his wife at bedside -Encouraged to consider hospice at home  Patient to go home with home health PT  Medications     (feeding supplement) PROSource Plus  30 mL Oral BID BM   Chlorhexidine Gluconate Cloth  6 each Topical Daily   feeding supplement  237 mL Oral 5 X Daily   fluconazole  400 mg  Oral Daily   insulin pump   Subcutaneous TID WC, HS, 0200   lactulose  30 g Oral Daily   mouth rinse  15 mL Mouth Rinse 4 times per day   pantoprazole  (PROTONIX) IV  40 mg Intravenous Q12H   potassium chloride  40 mEq Oral Once   sodium chloride flush  10-40 mL Intracatheter Q12H   sucralfate  1 g Oral TID WC & HS     Data Reviewed:   CBG:  Recent Labs  Lab 11/30/21 1200 11/30/21 1632 11/30/21 2213 12/01/21 0825 12/01/21 1126  GLUCAP 162* 230* 165* 131* 139*    SpO2: 97 %    Vitals:   11/30/21 2213 11/30/21 2217 12/01/21 0441 12/01/21 0954  BP: (!) 97/59 (!) 97/59 100/63 (!) 104/59  Pulse: 98 98 93 91  Resp:  '20 18 18  '$ Temp:  99.4 F (37.4 C) 97.9 F (36.6 C) 98.5 F (36.9 C)  TempSrc:  Oral Oral Oral  SpO2: 100% 100% 100% 97%  Weight:      Height:          Data Reviewed:  Basic Metabolic Panel: Recent Labs  Lab 11/26/21 2320 11/27/21 0527 11/27/21 0538 11/27/21 1650 11/27/21 2108 11/28/21 1215 11/29/21 1118 11/30/21 0327 12/01/21 0211  NA 120* 121* 120*   < > 121* 123* 120* 120* 120*  K 4.8 4.7 4.7  --   --  3.5 2.8* 3.4* 3.7  CL 86* 86*  --   --   --  94* 90* 92* 90*  CO2 22 24  --   --   --  22 21* 20* 19*  GLUCOSE 139* 111*  --   --   --  231* 121* 118* 120*  BUN 33* 33*  --   --   --  27* 17 16 29*  CREATININE 1.10 1.16  --   --   --  1.04 0.74 0.79 1.10  CALCIUM 7.8* 8.0*  --   --   --  7.2* 7.4* 7.2* 7.4*  MG 2.1 2.0  --   --   --   --   --   --   --   PHOS  --   --   --   --   --   --   --   --  3.3   < > = values in this interval not displayed.    CBC: Recent Labs  Lab 11/26/21 1452 11/26/21 2320 11/27/21 0527 11/27/21 0538 11/28/21 1215 11/29/21 1118  WBC 15.0* 18.5* 14.1*  --  11.2* 10.7*  NEUTROABS 12.4* 15.7* 11.3*  --   --   --   HGB 11.1* 11.3* 10.1* 10.2* 8.8* 8.2*  HCT 30.8* 31.1* 27.6* 30.0* 24.6* 22.2*  MCV 96.6 97.8 97.9  --  100.0 99.1  PLT 155 174 147*  --  129* 137*    LFT Recent Labs  Lab 11/26/21 2320 11/27/21 0527 11/28/21 1215 11/29/21 1118 11/30/21 0327 12/01/21 0211  AST 147* 136* 131* 136* 271*  --   ALT 48* 43 40 42 69*  --   ALKPHOS 445*  375* 329* 257* 270*  --   BILITOT 4.7* 5.2* 6.0* 6.7* 6.2*  --   PROT 6.0* 5.4* 5.0* 5.3* 5.1*  --   ALBUMIN 1.9* 1.7* 1.8* 2.4* 2.2* 2.0*     Antibiotics: Anti-infectives (From admission, onward)    Start     Dose/Rate Route Frequency Ordered Stop   11/27/21 1815  fluconazole (  DIFLUCAN) tablet 400 mg        400 mg Oral Daily 11/27/21 1813          DVT prophylaxis: SCDs  Code Status: Full code  Family Communication: Discussed with wife at bedside   CONSULTS oncology   Objective    Physical Examination:     General: Somnolent, patient received morphine this morning Cardiovascular: S1-S2, regular, no murmur auscultated Respiratory: Clear to auscultation bilaterally Abdomen: Abdomen is soft, mildly distended, peritoneal drain in place Extremities: No edema in the lower extremities Neurologic: Somnolent, received morphine, not arousable to verbal stimuli   Status is: Inpatient:          Oswald Hillock   [facility] If 7PM-7AM, please contact night-coverage at [email or web address], Office  [phone]   12/01/2021, 1:46 PM  LOS: 4 days

## 2021-12-01 NOTE — TOC Progression Note (Signed)
Transition of Care [facility]) - Progression Note    Patient Details  Name: MACARIUS RUARK MRN: [id number] Date of Birth: 1975-04-01  Transition of Care [facility]) CM/SW Contact  Bartholomew Crews, RN Phone Number: [phone] 12/01/2021, 4:18 PM  Clinical Narrative:     Notified by MD that spouse wanting to discuss home DME. Attempted to call wife - verbal message left with her son who answered her phone.   Spoke with patient and mother in law at the bedside. Spouse to have more availability tomorrow.     Barriers to Discharge: Continued Medical Work up  Expected Discharge Plan and Services     Discharge Planning Services: CM Consult   Living arrangements for the past 2 months: Single Family Home                 DME Arranged: N/A DME Agency: NA                   Social Determinants of Health (SDOH) Interventions    Readmission Risk Interventions     No data to display

## 2021-12-01 NOTE — Progress Notes (Signed)
[facility] Progress Note   Subjective:   No new issues.  Father bedside.    Objective Vitals:   11/30/21 2213 11/30/21 2217 12/01/21 0441 12/01/21 0954  BP: (!) 97/59 (!) 97/59 100/63 (!) 104/59  Pulse: 98 98 93 91  Resp:  '20 18 18  '$ Temp:  99.4 F (37.4 C) 97.9 F (36.6 C) 98.5 F (36.9 C)  TempSrc:  Oral Oral Oral  SpO2: 100% 100% 100% 97%  Weight:      Height:       Physical Exam General: chronically ill appearing, temporal wasting  Heart:RRR Lungs:clear Abdomen: soft, LLQ ascites drain with leaking on dressing, abd more distended than yesterday Extremities:trace dependent LE edema Neuro: drowsy but awakens to voice  Additional Objective Labs: Basic Metabolic Panel: Recent Labs  Lab 11/29/21 1118 11/30/21 0327 12/01/21 0211  NA 120* 120* 120*  K 2.8* 3.4* 3.7  CL 90* 92* 90*  CO2 21* 20* 19*  GLUCOSE 121* 118* 120*  BUN 17 16 29*  CREATININE 0.74 0.79 1.10  CALCIUM 7.4* 7.2* 7.4*  PHOS  --   --  3.3    Liver Function Tests: Recent Labs  Lab 11/28/21 1215 11/29/21 1118 11/30/21 0327 12/01/21 0211  AST 131* 136* 271*  --   ALT 40 42 69*  --   ALKPHOS 329* 257* 270*  --   BILITOT 6.0* 6.7* 6.2*  --   PROT 5.0* 5.3* 5.1*  --   ALBUMIN 1.8* 2.4* 2.2* 2.0*    Recent Labs  Lab 11/26/21 2320  LIPASE 68*    CBC: Recent Labs  Lab 11/26/21 1452 11/26/21 1452 11/26/21 2320 11/27/21 0527 11/27/21 0538 11/28/21 1215 11/29/21 1118  WBC 15.0*   < > 18.5* 14.1*  --  11.2* 10.7*  NEUTROABS 12.4*  --  15.7* 11.3*  --   --   --   HGB 11.1*   < > 11.3* 10.1* 10.2* 8.8* 8.2*  HCT 30.8*  --  31.1* 27.6* 30.0* 24.6* 22.2*  MCV 96.6  --  97.8 97.9  --  100.0 99.1  PLT 155   < > 174 147*  --  129* 137*   < > = values in this interval not displayed.    Blood Culture    Component Value Date/Time   SDES  11/17/2021 1234    BLOOD LEFT ANTECUBITAL Performed at [facility], [place] [address] [place], [place] [address]     SDES  11/17/2021 1234    BLOOD BLOOD LEFT HAND Performed at [facility], [place] [address], [place], [place] [address]    [place]  11/17/2021 1234    BOTTLES DRAWN AEROBIC AND ANAEROBIC Blood Culture adequate volume Performed at [facility], [place] [address], [place], [place] [address]    [place]  11/17/2021 1234    BOTTLES DRAWN AEROBIC AND ANAEROBIC Blood Culture adequate volume Performed at [facility], [place] [address] [place], [place] [address]    CULT  11/17/2021 1234    NO GROWTH 5 DAYS Performed at [place] [address], [place], [place] [address]    CULT  11/17/2021 1234    NO GROWTH 5 DAYS Performed at [facility], [place] [address], [place], [place] [address]    REPTSTATUS 11/22/2021 FINAL 11/17/2021 1234   REPTSTATUS 11/22/2021 FINAL 11/17/2021 1234    Cardiac Enzymes: Recent Labs  Lab 11/27/21 0527  CKTOTAL 27*    CBG: Recent  Labs  Lab 11/30/21 0742 11/30/21 1200 11/30/21 1632 11/30/21 2213 12/01/21 0825  GLUCAP 163* 162* 230* 165* 131*    Iron Studies: No results for input(s): "IRON", "TIBC", "TRANSFERRIN", "FERRITIN" in the last 72 hours. '@[email or web address]'$ @ Studies/Results: No results found. Medications:   (feeding supplement) PROSource Plus  30 mL Oral BID BM   Chlorhexidine Gluconate Cloth  6 each Topical Daily   feeding supplement  237 mL Oral 5 X Daily   fluconazole  400 mg Oral Daily   insulin pump   Subcutaneous TID WC, HS, 0200   lactulose  30 g Oral Daily   mouth rinse  15 mL Mouth Rinse 4 times per day   pantoprazole (PROTONIX) IV  40 mg Intravenous Q12H   potassium chloride  40 mEq Oral Once   sodium chloride flush  10-40 mL Intracatheter Q12H   sucralfate  1 g Oral TID WC & HS   Assessment/Plan **metastatic cholangiocarcinoma: onc following; have recommended hospice.  No further treatment options available.    **hyponatremia:  suspect this is  multifactorial - has chronic mild hyponatremia related to liver disease with baseline 128-130 recently worsened in the setting of poor po intake related to odynophagia.   Hyponatremia worse with albumin infusion - SIADH vs liver failure + low solute intake with recent liquid diet.  At this point limited therapeutic options - wouldn't use salt tabs as would ^ ascites, no vaptan given liver issues.  Wife generally wishes for comfort for him - po intake as desired so no fluid restriction, drain ascites for his comfort but not ready to stop checking labs yet.  His sodium is 120 and stable c/w yesterday, no changes today; ok with 1L paracentesis for comfort as desired.    **ascites:  wife reporting this is a major detractor from QoL and ability to eat - I think small volume 1L daily or QoD would be very reasonable. He's more distended today so would be ok with 1L.  **lethargy:  multifactorial, recently found to have elevated ammonia/hepatic encephalopathy contributing to picture and is on lactulose now.  Part of it certainly FTT.  I do not think he needs hypertonic saline.   **Odynophagia:  s/p EGD this week - per GI   I don't have more to offer in this case -- they are understandably struggling with this terminal situation.  Long conversations with wife past few days -- she expresses mainly she wants him to be comfortable and I've encouraged her to consider hospice care as it seems most consistent with their wishes in light of no further treatment options.  The peritoneal drain of 1L on days he's feeling distended is very reasonable and I don't thinks this low volume drain with cause a marked difference in his sodium.  Liberalize diet as able but probably should avoid huge sodium loads.  I don't think he needs to stay in the hospital if otherwise ready to go.  He's going to follow up with Dr. Benay Spice in clinic next week - if I can be of any assistance please call me [phone].    Jannifer Hick  MD 12/01/2021, 11:13 AM  [facility] Pager: [phone]

## 2021-12-01 NOTE — Plan of Care (Signed)

## 2021-12-02 DIAGNOSIS — G9340 Encephalopathy, unspecified: Secondary | ICD-10-CM | POA: Diagnosis not present

## 2021-12-02 DIAGNOSIS — E871 Hypo-osmolality and hyponatremia: Secondary | ICD-10-CM | POA: Diagnosis not present

## 2021-12-02 DIAGNOSIS — K7682 Hepatic encephalopathy: Secondary | ICD-10-CM | POA: Diagnosis not present

## 2021-12-02 LAB — GLUCOSE, CAPILLARY
Glucose-Capillary: 150 mg/dL — ABNORMAL HIGH (ref 70–99)
Glucose-Capillary: 108 mg/dL — ABNORMAL HIGH (ref 70–99)
Glucose-Capillary: 139 mg/dL — ABNORMAL HIGH (ref 70–99)
Glucose-Capillary: 151 mg/dL — ABNORMAL HIGH (ref 70–99)
Glucose-Capillary: 124 mg/dL — ABNORMAL HIGH (ref 70–99)

## 2021-12-02 LAB — CBC
HCT: 23.6 % — ABNORMAL LOW (ref 39.0–52.0)
Hemoglobin: 8.8 g/dL — ABNORMAL LOW (ref 13.0–17.0)
MCH: 37.6 pg — ABNORMAL HIGH (ref 26.0–34.0)
MCHC: 37.3 g/dL — ABNORMAL HIGH (ref 30.0–36.0)
MCV: 100.9 fL — ABNORMAL HIGH (ref 80.0–100.0)
Platelets: 106 10*3/uL — ABNORMAL LOW (ref 150–400)
RBC: 2.34 MIL/uL — ABNORMAL LOW (ref 4.22–5.81)
RDW: 19.6 % — ABNORMAL HIGH (ref 11.5–15.5)
WBC: 9.2 10*3/uL (ref 4.0–10.5)
nRBC: 0 % (ref 0.0–0.2)

## 2021-12-02 LAB — COMPREHENSIVE METABOLIC PANEL
ALT: 83 U/L — ABNORMAL HIGH (ref 0–44)
AST: 308 U/L — ABNORMAL HIGH (ref 15–41)
Albumin: 1.7 g/dL — ABNORMAL LOW (ref 3.5–5.0)
Alkaline Phosphatase: 274 U/L — ABNORMAL HIGH (ref 38–126)
Anion gap: 12 (ref 5–15)
BUN: 48 mg/dL — ABNORMAL HIGH (ref 6–20)
CO2: 20 mmol/L — ABNORMAL LOW (ref 22–32)
Calcium: 7.8 mg/dL — ABNORMAL LOW (ref 8.9–10.3)
Chloride: 89 mmol/L — ABNORMAL LOW (ref 98–111)
Creatinine, Ser: 1.26 mg/dL — ABNORMAL HIGH (ref 0.61–1.24)
GFR, Estimated: >60 mL/min (ref >60)
Glucose, Bld: 122 mg/dL — ABNORMAL HIGH (ref 70–99)
Potassium: 4.4 mmol/L (ref 3.5–5.1)
Sodium: 121 mmol/L — ABNORMAL LOW (ref 135–145)
Total Bilirubin: 6.7 mg/dL — ABNORMAL HIGH (ref 0.3–1.2)
Total Protein: 5.2 g/dL — ABNORMAL LOW (ref 6.5–8.1)

## 2021-12-02 LAB — AMMONIA: Ammonia: 101 umol/L — ABNORMAL HIGH (ref 9–35)

## 2021-12-02 MED ORDER — LACTULOSE 10 GM/15ML PO SOLN
30.0000 g | Freq: Three times a day (TID) | ORAL | Status: DC
  Administered 2021-12-02 – 2021-12-03 (×2): 30 g via ORAL
  Filled 2021-12-02 (×2): qty 45

## 2021-12-02 MED ORDER — LACTULOSE 10 GM/15ML PO SOLN: 30.0000 g | Freq: Three times a day (TID) | ORAL | Status: DC

## 2021-12-02 NOTE — Progress Notes (Signed)
[facility]  PROGRESS NOTE  Matthew Osborne IRC:[id number] DOB: [birth year] DOA: 11/26/2021 PCP: Susy Frizzle, MD   Brief HPI:   46 y.o. male with medical history significant for stage IV cholangiocarcinoma with metastatic disease to the liver resulting in liver failure, chronic hyponatremia with baseline serum sodium of 128- 130, type 2 diabetes mellitus, who is admitted to [facility] on 11/26/2021 with acute encephalopathy after presenting from home to [facility] ED for evaluation of altered mental status.  Patient was recently hospitalized at [facility] long from 11/23/2021 to 11/19/2021 for dysphagia and was diagnosed with esophageal candidiasis.  He was started on fluconazole on 11/18/2021.  Patient's sodium level was 119 on 11/15/2021 with baseline sodium of 1 28-1 30.This subsequent improved with IV fluids, back to baseline, with final serum sodium level of 128 noted on day of discharge to home from [facility] on approximately 3 more weeks of fluconazole for esophageal candidiasis.   Over the past few days patient has been having increasing somnolence , with poor p.o. intake. Patient is followed by oncology for stage IV cholangiocarcinoma, he also has recurrent ascites requiring frequent paracentesis.  Ultimately he underwent placement of Pleurx catheter on 10/31/2021.  Patient was noted with increasing somnolence and chronic hyponatremia.   Subjective   Patient seen and examined, feels generally weak.  No other complaints.   Assessment/Plan:    Acute metabolic encephalopathy -Improved -Likely from hepatic encephalopathy, INR is up to 1.6 -Ammonia level elevated at 169; ammonia level checked yesterday is down to 44 -Dose of lactulose changed to 30 g once a day as per patient family request  -Chest x-ray was unremarkable -CT abdomen obtained today shows multiple hepatic metastasis  Lactic acidosis -Patient presented with lactic of 4.1 -No overt infection was  suspected -Patient was given IV fluids -Repeat lactic acid was down to 2.9 and now again up to 3.4 -Likely from hepatic metastasis  Acute on chronic hyponatremia -Patient has chronic hyponatremia with serum sodium of 128-130 -Presented with sodium of 120; urine osmolality 635, urine sodium less than 10 -Patient has significant ascites;?  Hypervolemic hyponatremia Appreciate nephrology input for hyponatremia -Sodium is again down to 120 this morning -Patient was given albumin 25 g IV every 6 hours for volume expansion -Nephrology has signed off, sodium will likely stay around 120 due to multifactorial causes  Malignant ascites -Patient has a peritoneal catheter in place -As per patient wife they were draining about a liter every day at home -Nephrology consulted to help with fluid management -Recommend to drain 1 L of ascitic fluid as needed for comfort  Hypokalemia -Replete  Dysphagia -Patient had no improvement despite course of fluconazole -GI was consulted -Patient underwent EGD this morning which showed erosive esophagitis; H. pylori negative -Started on sucralfate; await biopsy results  Diabetes mellitus type 2 -Continue insulin pump -CBG well controlled  Stage IV cholangiocarcinoma -Followed by oncology  Acute kidney injury -Resolved with IV hydration  Goal of care -Patient wife wants him to be full code -Not ready for hospice -Oncology has recommended hospice, had long discussion with patient and his wife at bedside -Encouraged to consider hospice at home  Patient to go home with hospice  Medications     (feeding supplement) PROSource Plus  30 mL Oral BID BM   Chlorhexidine Gluconate Cloth  6 each Topical Daily   feeding supplement  237 mL Oral 5 X Daily   fluconazole  400 mg Oral Daily   insulin pump  Subcutaneous TID WC, HS, 0200   lactulose  30 g Oral Daily   mouth rinse  15 mL Mouth Rinse 4 times per day   pantoprazole (PROTONIX) IV  40 mg  Intravenous Q12H   potassium chloride  40 mEq Oral Once   sodium chloride flush  10-40 mL Intracatheter Q12H   sucralfate  1 g Oral TID WC & HS     Data Reviewed:   CBG:  Recent Labs  Lab 12/01/21 1126 12/01/21 1611 12/02/21 0212 12/02/21 0742 12/02/21 1141  GLUCAP 139* 255* 150* 108* 139*    SpO2: 97 %    Vitals:   12/01/21 1613 12/01/21 2043 12/02/21 0518 12/02/21 0754  BP: 116/69 (!) 98/57 (!) 91/47 (!) 99/55  Pulse: 95 97 94 91  Resp: '18 16 15 18  '$ Temp: 98.3 F (36.8 C) 98.8 F (37.1 C) 98.4 F (36.9 C) 98.5 F (36.9 C)  TempSrc: Oral Oral Oral Oral  SpO2: 97% 97% 98% 97%  Weight:      Height:          Data Reviewed:  Basic Metabolic Panel: Recent Labs  Lab 11/26/21 2320 11/27/21 0527 11/27/21 0538 11/27/21 1650 11/27/21 2108 11/28/21 1215 11/29/21 1118 11/30/21 0327 12/01/21 0211  NA 120* 121* 120*   < > 121* 123* 120* 120* 120*  K 4.8 4.7 4.7  --   --  3.5 2.8* 3.4* 3.7  CL 86* 86*  --   --   --  94* 90* 92* 90*  CO2 22 24  --   --   --  22 21* 20* 19*  GLUCOSE 139* 111*  --   --   --  231* 121* 118* 120*  BUN 33* 33*  --   --   --  27* 17 16 29*  CREATININE 1.10 1.16  --   --   --  1.04 0.74 0.79 1.10  CALCIUM 7.8* 8.0*  --   --   --  7.2* 7.4* 7.2* 7.4*  MG 2.1 2.0  --   --   --   --   --   --   --   PHOS  --   --   --   --   --   --   --   --  3.3   < > = values in this interval not displayed.    CBC: Recent Labs  Lab 11/26/21 1452 11/26/21 2320 11/27/21 0527 11/27/21 0538 11/28/21 1215 11/29/21 1118  WBC 15.0* 18.5* 14.1*  --  11.2* 10.7*  NEUTROABS 12.4* 15.7* 11.3*  --   --   --   HGB 11.1* 11.3* 10.1* 10.2* 8.8* 8.2*  HCT 30.8* 31.1* 27.6* 30.0* 24.6* 22.2*  MCV 96.6 97.8 97.9  --  100.0 99.1  PLT 155 174 147*  --  129* 137*    LFT Recent Labs  Lab 11/26/21 2320 11/27/21 0527 11/28/21 1215 11/29/21 1118 11/30/21 0327 12/01/21 0211  AST 147* 136* 131* 136* 271*  --   ALT 48* 43 40 42 69*  --   ALKPHOS 445*  375* 329* 257* 270*  --   BILITOT 4.7* 5.2* 6.0* 6.7* 6.2*  --   PROT 6.0* 5.4* 5.0* 5.3* 5.1*  --   ALBUMIN 1.9* 1.7* 1.8* 2.4* 2.2* 2.0*     Antibiotics: Anti-infectives (From admission, onward)    Start     Dose/Rate Route Frequency Ordered Stop   11/27/21 1815  fluconazole (DIFLUCAN) tablet 400 mg  400 mg Oral Daily 11/27/21 1813          DVT prophylaxis: SCDs  Code Status: Full code  Family Communication: Discussed with wife at bedside   CONSULTS oncology   Objective    Physical Examination:   General: Appears lethargic Cardiovascular: S1-S2, regular, no murmur auscultated Respiratory: Clear to auscultation bilaterally Abdomen: Abdomen is soft, mild distention, nontender Extremities: No edema in the lower extremities Neurologic: Somnolent but arousable    Status is: Inpatient:          Oswald Hillock   [facility] If 7PM-7AM, please contact night-coverage at [email or web address], Office  [phone]   12/02/2021, 1:53 PM  LOS: 5 days

## 2021-12-02 NOTE — Plan of Care (Signed)

## 2021-12-02 NOTE — TOC Progression Note (Addendum)
Transition of Care [facility]) - Progression Note    Patient Details  Name: Matthew Osborne MRN: [id number] Date of Birth: 1975-12-04  Transition of Care [facility]) CM/SW Contact  Matthew Crews, RN Phone Number: [phone] 12/02/2021, 12:27 PM  Clinical Narrative:     Spoke with patient's wife at the bedside to discuss post acute transition. Discussed DME, HH, and hospice options. Offered choice of hospice agency. Referral sent to [facility] for home hospice care. Wife stated that she would like hospital bed and bedside commode, but she wanted to be able to bring him home today even if it took a few days to get DME. Advised that MD could likely provide prescriptions for comfort - wife stated that would be nice. Wife has 3 sons to assist her with care and with transporting home in private vehicle, she requests assist with assisting him into car.   UPDATE: Received call back from [place] at [facility]. Referral accepted. Hospice to reach out to patient's wife, Matthew Osborne, tomorrow and get DME needs at that time. Spoke with Matthew Osborne on her cell phone to advise of f/u to referral tomorrow. Matthew Osborne stated that she is going to talk to the family and determine if today or tomorrow would be best for discharge.   UPDATE: Matthew Osborne asking about patient getting some baseline labs. She then wants to take patient home this evening when her sons are available to assist with getting him settled. MD notified.     Barriers to Discharge: Continued Medical Work up  Expected Discharge Plan and Services     Discharge Planning Services: CM Consult   Living arrangements for the past 2 months: Single Family Home                 DME Arranged: N/A DME Agency: NA                   Social Determinants of Health (SDOH) Interventions    Readmission Risk Interventions     No data to display

## 2021-12-03 ENCOUNTER — Other Ambulatory Visit (HOSPITAL_BASED_OUTPATIENT_CLINIC_OR_DEPARTMENT_OTHER): Payer: Self-pay

## 2021-12-03 ENCOUNTER — Inpatient Hospital Stay: Payer: No Typology Code available for payment source | Admitting: Nurse Practitioner

## 2021-12-03 ENCOUNTER — Inpatient Hospital Stay: Payer: No Typology Code available for payment source

## 2021-12-03 DIAGNOSIS — E871 Hypo-osmolality and hyponatremia: Secondary | ICD-10-CM | POA: Diagnosis not present

## 2021-12-03 DIAGNOSIS — C221 Intrahepatic bile duct carcinoma: Secondary | ICD-10-CM

## 2021-12-03 DIAGNOSIS — G9340 Encephalopathy, unspecified: Secondary | ICD-10-CM | POA: Diagnosis not present

## 2021-12-03 DIAGNOSIS — C787 Secondary malignant neoplasm of liver and intrahepatic bile duct: Secondary | ICD-10-CM

## 2021-12-03 DIAGNOSIS — N179 Acute kidney failure, unspecified: Secondary | ICD-10-CM | POA: Diagnosis not present

## 2021-12-03 LAB — GLUCOSE, CAPILLARY
Glucose-Capillary: 141 mg/dL — ABNORMAL HIGH (ref 70–99)
Glucose-Capillary: 149 mg/dL — ABNORMAL HIGH (ref 70–99)

## 2021-12-03 MED ORDER — OXYCODONE HCL 5 MG PO TABS
5.0000 mg | ORAL_TABLET | Freq: Four times a day (QID) | ORAL | 0 refills | Status: AC | PRN
  Filled 2021-12-03: qty 20, 5d supply, fill #0

## 2021-12-03 MED ORDER — HEPARIN SOD (PORK) LOCK FLUSH 100 UNIT/ML IV SOLN
500.0000 [IU] | INTRAVENOUS | Status: AC | PRN
  Administered 2021-12-03: 500 [IU]

## 2021-12-03 MED ORDER — LACTULOSE 10 GM/15ML PO SOLN
30.0000 g | Freq: Three times a day (TID) | ORAL | 5 refills | Status: AC
  Filled 2021-12-03: qty 473, 4d supply, fill #0
  Filled 2021-12-03: qty 473, 3d supply, fill #0
  Filled 2021-12-14: qty 473, 4d supply, fill #1
  Filled 2021-12-17: qty 946, 7d supply, fill #2

## 2021-12-03 NOTE — TOC Progression Note (Signed)
Transition of Care [facility]) - Progression Note    Patient Details  Name: Matthew Osborne MRN: [id number] Date of Birth: 07-Jan-1976  Transition of Care [facility]) CM/SW Contact  Bartholomew Crews, RN Phone Number: [phone] 12/03/2021, 10:18 AM  Clinical Narrative:     Spoke with patient's spouse at the bedside. Spouse is still wanting to bring patient home today. Discussed [facility] reaching out to her today to arrange for admission and DME. Spouse stated that she would like to have the option to be able to have labs drawn and for patient to take lactulose. Discussed the difference between palliative and hospice care and that [place] would be able to assist in each area. Spouse would like to transport patient home in private vehicle. TOC following for transition needs.     Barriers to Discharge: Continued Medical Work up  Expected Discharge Plan and Services     Discharge Planning Services: CM Consult   Living arrangements for the past 2 months: Single Family Home                 DME Arranged: N/A DME Agency: NA                   Social Determinants of Health (SDOH) Interventions    Readmission Risk Interventions     No data to display

## 2021-12-03 NOTE — Discharge Summary (Signed)
Physician Discharge Summary   Patient: Matthew Osborne MRN: [id number] DOB: 01/29/1976  Admit date:     11/26/2021  Discharge date: 12/03/21  Discharge Physician: Gagan S Lama   PCP: Pickard, Warren T, MD   Recommendations at discharge:   Patient to go home with home hospice Continue lactulose 30 g p.o. 3 times daily Drain peritoneal fluid as needed for comfort  Discharge Diagnoses: Principal Problem:   Acute encephalopathy Active Problems:   Acute hyponatremia   AKI (acute kidney injury) (HCC)   DM2 (diabetes mellitus, type 2) (HCC)   Lactic acidosis   Leukocytosis   Liver failure (HCC)  Resolved Problems:   * No resolved hospital problems. *  Hospital Course:  46 y.o. male with medical history significant for stage IV cholangiocarcinoma with metastatic disease to the liver resulting in liver failure, chronic hyponatremia with baseline serum sodium of 128- 130, type 2 diabetes mellitus, who is admitted to [facility] on 11/26/2021 with acute encephalopathy after presenting from home to [facility] ED for evaluation of altered mental status.   Patient was recently hospitalized at [facility] from 11/23/2021 to 11/19/2021 for dysphagia and was diagnosed with esophageal candidiasis.  He was started on fluconazole on 11/18/2021.  Patient's sodium level was 119 on 11/15/2021 with baseline sodium of 1 28-1 30.This subsequent improved with IV fluids, back to baseline, with final serum sodium level of 128 noted on day of discharge to home from [facility] on approximately 3 more weeks of fluconazole for esophageal candidiasis.   Over the past few days patient has been having increasing somnolence , with poor p.o. intake. Patient is followed by oncology for stage IV cholangiocarcinoma, he also has recurrent ascites requiring frequent paracentesis.  Ultimately he underwent placement of Pleurx catheter on 10/31/2021.   Patient was noted with increasing somnolence and chronic hyponatremia.    Assessment and Plan:  Acute metabolic encephalopathy -Improved -Likely from hepatic encephalopathy, INR is up to 1.6 -Ammonia level elevated at 169; ammonia level checked yesterday is down to 44 -Dose of lactulose changed to 30 g 3 times daily as ammonia was elevated up to 101  -Chest x-ray was unremarkable -CT abdomen obtained today shows multiple hepatic metastasis   Lactic acidosis -Patient presented with lactic of 4.1 -No overt infection was suspected -Patient was given IV fluids -Repeat lactic acid was down to 2.9 and now again up to 3.4 -Likely from hepatic metastasis   Acute on chronic hyponatremia -Patient has chronic hyponatremia with serum sodium of 128-130 -Presented with sodium of 120; urine osmolality 635, urine sodium less than 10 -Patient has significant ascites;?  Hypervolemic hyponatremia Appreciate nephrology input for hyponatremia -Sodium is again down to 120 this morning -Patient was given albumin 25 g IV every 6 hours for volume expansion -Nephrology has signed off, sodium will likely stay around 120 due to multifactorial causes   Malignant ascites -Patient has a peritoneal catheter in place -As per patient wife they were draining about a liter every day at home -Nephrology consulted to help with fluid management -Recommend to drain 1 L of ascitic fluid as needed for comfort   Hypokalemia -Replete   Dysphagia -Patient had no improvement despite course of fluconazole -GI was consulted -Patient underwent EGD this morning which showed erosive esophagitis; H. pylori negative -Started on sucralfate; await biopsy results shows gastric antrum with nonspecific reactive gastropathy   Diabetes mellitus type 2 -Continue insulin pump    Stage IV cholangiocarcinoma -Followed by oncology -No further   treatments recommended, patient to go home with hospice -Patient is currently remain full code   Acute kidney injury -Resolved with IV hydration   Goal of  care -Patient wife wants him to be full code -Oncology has recommended hospice, had long discussion with patient and his wife at bedside -Encouraged to consider hospice at home -Patient to go home with hospice        Consultants: Oncology Procedures performed:  Disposition: Home Diet recommendation:  Discharge Diet Orders (From admission, onward)     Start     Ordered   12/03/21 0000  Diet - low sodium heart healthy        12/03/21 1216           Regular diet DISCHARGE MEDICATION: Allergies as of 12/03/2021       Reactions   Niaspan [niacin] Other (See Comments)   Flushing        Medication List     TAKE these medications    Baqsimi Two Pack 3 MG/DOSE Powd Generic drug: Glucagon Use as directed nasally once as needed (as directed Nasally Once a day PRN 30 days)   blood glucose meter kit and supplies Kit Fasting prior to each meal three times a day   Dexcom G6 Sensor Misc Use as directed to check blood sugar 5-6 times daily.   Dexcom G6 Transmitter Misc USE AS DIRECTED TO CHECK BLOOD SUGAR 5-6 TIMES DAILY *needs office visit*   dicyclomine 10 MG/5ML solution Commonly known as: BENTYL Take 5 mLs (10 mg total) by mouth 3 (three) times daily as needed (abdominal pain).   fluconazole 200 MG tablet Commonly known as: DIFLUCAN Take 2 tablets (400 mg total) by mouth daily for 23 days.   FLUoxetine 20 MG capsule Commonly known as: PROZAC Take 1 capsule (20 mg total) by mouth daily.   FreeStyle Lite w/Device Kit Use as directed.   glucose blood test strip Dispense based on patient and insurance preference.  Use twice daily as directed. (FOR ICD-10 E11.65)   HumaLOG 100 UNIT/ML injection Generic drug: insulin lispro Inject 200 units/day under the skin via pump   Insulin Syringe 27G X 1/2" 0.5 ML Misc Use as directed to inject insulin SQ Q1D.   lactulose 10 GM/15ML solution Commonly known as: CHRONULAC Take 45 mLs (30 g total) by mouth 3 (three)  times daily.   lidocaine-prilocaine cream Commonly known as: EMLA APPLY 1 APPLICATION TOPICALLY AS DIRECTED. APPLY TO PORT SITE 1-2 HOURS PRIOR TO STICK AND COVER WITH PLASTIC WRAP. What changed:  how much to take how to take this when to take this additional instructions   loratadine 10 MG tablet Commonly known as: CLARITIN Take 10 mg by mouth as needed for allergies.   LORazepam 0.5 MG tablet Commonly known as: ATIVAN Take 1 tablet (0.5 mg total) by mouth at bedtime as needed for anxiety.   magic mouthwash (nystatin, lidocaine, diphenhydrAMINE, alum & mag hydroxide) suspension Take 10 mLs by mouth every 4 (four) hours as needed for mouth pain.   Omnipod 5 G6 Pod (Gen 5) Misc Use as directed every 48 hours   oxyCODONE 5 MG immediate release tablet Commonly known as: Oxy IR/ROXICODONE Take 1 tablet (5 mg total) by mouth every 6 (six) hours as needed for severe pain.   pantoprazole 40 MG tablet Commonly known as: PROTONIX Take 1 tablet (40 mg total) by mouth daily.   prochlorperazine 10 MG tablet Commonly known as: COMPAZINE Take 1 tablet by mouth every 6   hours as needed for nausea. (TAKE 1 TABLET BY MOUTH EVERY 6 HOURS AS NEEDED FOR NAUSEA) What changed: reasons to take this   SENOKOT LAXATIVE GUMMIES PO Take 2 tablets by mouth daily at 12 noon.   sucralfate 1 g tablet Commonly known as: CARAFATE Take 1 tablet (1 g total) by mouth 4 (four) times daily -  with meals and at bedtime.   Tresiba FlexTouch 100 UNIT/ML FlexTouch Pen Generic drug: insulin degludec Inject 6 units under the skin once daily.   TRUEplus Lancets 33G Misc 1 each by Does not apply route 2 (two) times daily.   Lancets Thin Misc Check BS BID   Unifine Pentips 32G X 4 MM Misc Generic drug: Insulin Pen Needle Use as directed               Durable Medical Equipment  (From admission, onward)           Start     Ordered   12/01/21 1350  For home use only DME Hospital bed  Once        Question Answer Comment  Length of Need Lifetime   Bed type Semi-electric      12/01/21 1349            Discharge Exam: Filed Weights   11/27/21 1958 12/02/21 2100  Weight: 88.3 kg 89.3 kg   General-alert, appears in no distress Heart S1-S2, regular, no murmur auscultated Lungs-clear to auscultation bilaterally Extremities-no edema in the lower extremities  Condition at discharge: fair  The results of significant diagnostics from this hospitalization (including imaging, microbiology, ancillary and laboratory) are listed below for reference.   Imaging Studies: CT ABDOMEN PELVIS W CONTRAST  Result Date: 11/28/2021 CLINICAL DATA:  Cholangiocarcinoma liver metastasis. RIGHT lower quadrant pain EXAM: CT ABDOMEN AND PELVIS WITH CONTRAST TECHNIQUE: Multidetector CT imaging of the abdomen and pelvis was performed using the standard protocol following bolus administration of intravenous contrast. RADIATION DOSE REDUCTION: This exam was performed according to the departmental dose-optimization program which includes automated exposure control, adjustment of the mA and/or kV according to patient size and/or use of iterative reconstruction technique. CONTRAST:  75mL OMNIPAQUE IOHEXOL 350 MG/ML SOLN COMPARISON:  CT 09/06/2021 FINDINGS: Lower chest: RIGHT middle lobe 3 mm nodule (image 24/5) is unchanged. Nodule over the RIGHT hemidiaphragm within the RIGHT lower lobe measures 5 mm (image 46/5) compared to 5 mm. Hepatobiliary: Multiple hypoenhancing masses within the LEFT RIGHT hepatic lobe again noted. Near confluent involvement the LEFT hepatic lobe. Example lesion the RIGHT hepatic lobe measures 4.1 cm (image 18/3) compared to 4.4 cm. Example lesion in the lateral RIGHT hepatic lobe measures 1.9 cm compared to 2.3 cm. Central RIGHT hepatic lobe lesion measures 5.0 cm compared to 4.7 cm (image 34/3). No new hepatic lesions are present. Visually lesion looks very similar. Increase in depth free  fluid surrounding the liver. anterior RIGHT hepatic lobe measuring 4 cm from the diaphragm compared to 2 cm on prior. Nodal metastasis in the porta hepatis is similar. Example node measures 2.7 cm compared to 3.1 cm. Pancreas: Metastatic lymph nodes adjacent head of the pancreas. No clear pancreatic lesion. No duct dilatation. Spleen: Normal spleen Adrenals/urinary tract: Adrenal glands and kidneys are normal. The ureters and bladder normal. Stomach/Bowel: Stomach small bowel normal. There is fluid within leaves of the mesentery similar prior. The colon and rectosigmoid colon are normal. Vascular/Lymphatic: Abdominal aorta is normal caliber. No periportal or retroperitoneal adenopathy. No pelvic adenopathy. Reproductive: Prostate unremarkable Other: No   peritoneal metastasis identified. Drainage catheter within the peritoneal space with tip along the RIGHT ventral abdominal wall Musculoskeletal: No aggressive osseous lesion. IMPRESSION: 1. small pulmonary nodules at the lung bases. Recommend attention on follow-up. 2. Multiple hepatic metastasis not appreciably changed from comparison exam. no new or enlarged hepatic metastasis. 3. Interval increase in intraperitoneal free fluid. 4. Stable necrotic periportal lymph nodes. Electronically Signed   By: Stewart  Edmunds M.D.   On: 11/28/2021 17:13   DG Chest Port 1 View  Result Date: 11/27/2021 CLINICAL DATA:  Vomiting, can not swallow, MS changes. EXAM: PORTABLE CHEST 1 VIEW COMPARISON:  11/23/2021. FINDINGS: The heart size and mediastinal contours are within normal limits. Lung volumes are low. No consolidation, effusion, or pneumothorax. A right chest port appear stable. No acute osseous abnormality. IMPRESSION: Low lung volumes with no active disease. Electronically Signed   By: Laura  Taylor M.D.   On: 11/27/2021 00:43   US Abdomen Complete  Result Date: 11/15/2021 CLINICAL DATA:  Acute kidney injury, ascites, history of cholangiocarcinoma EXAM: ABDOMEN  ULTRASOUND COMPLETE COMPARISON:  CT abdomen/pelvis dated 09/06/2021 FINDINGS: Gallbladder: Gallbladder wall thickening, measuring 6 mm, likely related to underdistention. No cholelithiasis. Common bile duct: Not visualized. Liver: Multiple hepatic metastases, measuring up to 4.8 cm in the left hepatic lobe. Portal vein is patent on color Doppler imaging with normal direction of blood flow towards the liver. IVC: Poorly visualized. Pancreas: Poorly visualized. Spleen: Size and appearance within normal limits. Right Kidney: Length: 14.9 cm. Echogenic renal parenchyma. No mass or hydronephrosis. Left Kidney: Length: 11.8 cm. Echogenic renal parenchyma. No mass or hydronephrosis. Abdominal aorta: No aneurysm visualized. Other findings: Abdominal ascites. IMPRESSION: Multiple hepatic metastases, measuring up to 4.8 cm in the left hepatic lobe. Abdominal ascites. Echogenic renal parenchyma, suggesting medical renal disease. No hydronephrosis. Electronically Signed   By: Sriyesh  Krishnan M.D.   On: 11/15/2021 20:23   DG Chest 2 View  Result Date: 11/15/2021 CLINICAL DATA:  46-year-old male with cough and sore throat. Productive cough. Cholangiocarcinoma with malignant ascites. EXAM: CHEST - 2 VIEW COMPARISON:  CT Chest, Abdomen, and Pelvis 07/13/2021 and earlier. FINDINGS: AP semi upright view at upright AP and lateral views of the chest at 0905 hours. Lower lung volumes. Stable right chest power port. Mediastinal contours are stable and within normal limits. A battery device external to the chest is visible only on the lateral view. Visualized tracheal air column is within normal limits. No pneumothorax, pulmonary edema, pleural effusion or confluent pulmonary opacity. Nonobstructed visible bowel gas pattern. No acute osseous abnormality identified. IMPRESSION: Low lung volumes.  No acute cardiopulmonary abnormality. Electronically Signed   By: H  Hall M.D.   On: 11/15/2021 09:21   IR Paracentesis  Result Date:  11/06/2021 INDICATION: 46-year-old male history of cholangiocarcinoma with recurrent malignant ascites. Patient presents for paracentesis due to leaking around existing abdominal PleurX exit site. EXAM: ULTRASOUND GUIDED therapeutic PARACENTESIS MEDICATIONS: None. COMPLICATIONS: None immediate. PROCEDURE: Informed written consent was obtained from the patient after a discussion of the risks, benefits and alternatives to treatment. A timeout was performed prior to the initiation of the procedure. Initial ultrasound scanning demonstrates a moderate amount of ascites within the right lower abdominal quadrant. The right lower abdomen was prepped and draped in the usual sterile fashion. An ultrasound image was saved for documentation purposesUsing PleurX adapter kit. The paracentesis was performed. The catheter was removed and a dressing was applied. The patient tolerated the procedure well without immediate post procedural complication. Following the   procedure a pursestring suture was placed by IR attending Dr. F. Mir FINDINGS: A total of approximately 4 L of straw-colored fluid was removed. IMPRESSION: Successful ultrasound-guided therapeutic paracentesis yielding 4 liters of peritoneal fluid. Electronically Signed   By: Farhaan  Mir M.D.   On: 11/06/2021 15:55    Microbiology: Results for orders placed or performed during the hospital encounter of 11/15/21  Group A Strep by PCR     Status: None   Collection Time: 11/15/21  9:24 AM   Specimen: Throat; Sterile Swab  Result Value Ref Range Status   Group A Strep by PCR NOT DETECTED NOT DETECTED Final    Comment: Performed at [facility], [address], [place], [place] [address]  Resp panel by RT-PCR (RSV, Flu A&B, Covid)     Status: None   Collection Time: 11/15/21 10:00 AM  Result Value Ref Range Status   SARS Coronavirus 2 by RT PCR NEGATIVE NEGATIVE Final    Comment: (NOTE) SARS-CoV-2 target nucleic acids are NOT DETECTED.  The  SARS-CoV-2 RNA is generally detectable in upper respiratory specimens during the acute phase of infection. The lowest concentration of SARS-CoV-2 viral copies this assay can detect is 138 copies/mL. A negative result does not preclude SARS-Cov-2 infection and should not be used as the sole basis for treatment or other patient management decisions. A negative result may occur with  improper specimen collection/handling, submission of specimen other than nasopharyngeal swab, presence of viral mutation(s) within the areas targeted by this assay, and inadequate number of viral copies(<138 copies/mL). A negative result must be combined with clinical observations, patient history, and epidemiological information. The expected result is Negative.  Fact Sheet for Patients:  https://www.fda.gov/media/152166/download  Fact Sheet for Healthcare Providers:  https://www.fda.gov/media/152162/download  This test is no t yet approved or cleared by the [place] FDA and  has been authorized for detection and/or diagnosis of SARS-CoV-2 by FDA under an Emergency Use Authorization (EUA). This EUA will remain  in effect (meaning this test can be used) for the duration of the COVID-19 declaration under Section 564(b)(1) of the Act, 21 U.S.C.section 360bbb-3(b)(1), unless the authorization is terminated  or revoked sooner.       Influenza A by PCR NEGATIVE NEGATIVE Final   Influenza B by PCR NEGATIVE NEGATIVE Final    Comment: (NOTE) The Xpert Xpress SARS-CoV-2/FLU/RSV plus assay is intended as an aid in the diagnosis of influenza from Nasopharyngeal swab specimens and should not be used as a sole basis for treatment. Nasal washings and aspirates are unacceptable for Xpert Xpress SARS-CoV-2/FLU/RSV testing.  Fact Sheet for Patients: https://www.fda.gov/media/152166/download  Fact Sheet for Healthcare Providers: https://www.fda.gov/media/152162/download  This test is not yet approved or  cleared by the [place] FDA and has been authorized for detection and/or diagnosis of SARS-CoV-2 by FDA under an Emergency Use Authorization (EUA). This EUA will remain in effect (meaning this test can be used) for the duration of the COVID-19 declaration under Section 564(b)(1) of the Act, 21 U.S.C. section 360bbb-3(b)(1), unless the authorization is terminated or revoked.     Resp Syncytial Virus by PCR NEGATIVE NEGATIVE Final    Comment: (NOTE) Fact Sheet for Patients: https://www.fda.gov/media/152166/download  Fact Sheet for Healthcare Providers: https://www.fda.gov/media/152162/download  This test is not yet approved or cleared by the [place] FDA and has been authorized for detection and/or diagnosis of SARS-CoV-2 by FDA under an Emergency Use Authorization (EUA). This EUA will remain in effect (meaning this test can be used) for the duration   of the COVID-19 declaration under Section 564(b)(1) of the Act, 21 U.S.C. section 360bbb-3(b)(1), unless the authorization is terminated or revoked.  Performed at [facility], [address] [place], [place] [address]   Culture, blood (Routine X 2) w Reflex to ID Panel     Status: None   Collection Time: 11/15/21  5:34 PM   Specimen: BLOOD LEFT HAND  Result Value Ref Range Status   Specimen Description   Final    BLOOD LEFT HAND SITE NOT SPECIFIED Performed at [facility] [address], [place], [place] [address]    Special Requests   Final    BOTTLES DRAWN AEROBIC ONLY Blood Culture adequate volume Performed at [facility] [address] [place], [place] [address]    Culture   Final    NO GROWTH 5 DAYS Performed at [facility], [place] [address] [place], [place] [address]    Report Status 11/20/2021 FINAL  Final  Culture, blood (Routine X 2) w Reflex to ID Panel     Status: Abnormal   Collection Time: 11/15/21  5:34 PM   Specimen: BLOOD  Result  Value Ref Range Status   Specimen Description   Final    BLOOD SITE NOT SPECIFIED Performed at [facility] [address] [place], [place] [address]    Special Requests   Final    BOTTLES DRAWN AEROBIC ONLY Blood Culture adequate volume Performed at [facility] [address], [place], [place] [address]    Culture  Setup Time   Final    GRAM POSITIVE COCCI IN CLUSTERS AEROBIC BOTTLE ONLY Organism ID to follow CRITICAL RESULT CALLED TO, READ BACK BY AND VERIFIED WITH: M LILLISTON,[id number]  11/17/21 [place]    Culture (A)  Final    STAPHYLOCOCCUS EPIDERMIDIS THE SIGNIFICANCE OF ISOLATING THIS ORGANISM FROM A SINGLE SET OF BLOOD CULTURES WHEN MULTIPLE SETS ARE DRAWN IS UNCERTAIN. PLEASE NOTIFY THE MICROBIOLOGY DEPARTMENT WITHIN ONE WEEK IF SPECIATION AND SENSITIVITIES ARE REQUIRED. Performed at [facility], [place] [address], [place], [place] [address]    Report Status 11/17/2021 FINAL  Final  Blood Culture ID Panel (Reflexed)     Status: Abnormal   Collection Time: 11/15/21  5:34 PM  Result Value Ref Range Status   Enterococcus faecalis NOT DETECTED NOT DETECTED Final   Enterococcus Faecium NOT DETECTED NOT DETECTED Final   Listeria monocytogenes NOT DETECTED NOT DETECTED Final   Staphylococcus species DETECTED (A) NOT DETECTED Final    Comment: CRITICAL RESULT CALLED TO, READ BACK BY AND VERIFIED WITH: M LILLISTON,[email or web address]  11/17/21 [place]    Staphylococcus aureus (BCID) NOT DETECTED NOT DETECTED Final   Staphylococcus epidermidis DETECTED (A) NOT DETECTED Final    Comment: CRITICAL RESULT CALLED TO, READ BACK BY AND VERIFIED WITH: M LILLISTON,[email or web address]  11/17/21 [place]    Staphylococcus lugdunensis NOT DETECTED NOT DETECTED Final   Streptococcus species NOT DETECTED NOT DETECTED Final   Streptococcus agalactiae NOT DETECTED NOT DETECTED Final   Streptococcus pneumoniae NOT DETECTED NOT DETECTED Final   Streptococcus pyogenes NOT DETECTED  NOT DETECTED Final   A.calcoaceticus-baumannii NOT DETECTED NOT DETECTED Final   Bacteroides fragilis NOT DETECTED NOT DETECTED Final   Enterobacterales NOT DETECTED NOT DETECTED Final   Enterobacter cloacae complex NOT DETECTED NOT DETECTED Final   Escherichia coli NOT DETECTED NOT DETECTED Final   Klebsiella aerogenes NOT DETECTED NOT DETECTED Final   Klebsiella oxytoca NOT DETECTED NOT DETECTED Final  Klebsiella pneumoniae NOT DETECTED NOT DETECTED Final   Proteus species NOT DETECTED NOT DETECTED Final   Salmonella species NOT DETECTED NOT DETECTED Final   Serratia marcescens NOT DETECTED NOT DETECTED Final   Haemophilus influenzae NOT DETECTED NOT DETECTED Final   Neisseria meningitidis NOT DETECTED NOT DETECTED Final   Pseudomonas aeruginosa NOT DETECTED NOT DETECTED Final   Stenotrophomonas maltophilia NOT DETECTED NOT DETECTED Final   Candida albicans NOT DETECTED NOT DETECTED Final   Candida auris NOT DETECTED NOT DETECTED Final   Candida glabrata NOT DETECTED NOT DETECTED Final   Candida krusei NOT DETECTED NOT DETECTED Final   Candida parapsilosis NOT DETECTED NOT DETECTED Final   Candida tropicalis NOT DETECTED NOT DETECTED Final   Cryptococcus neoformans/gattii NOT DETECTED NOT DETECTED Final   Methicillin resistance mecA/C NOT DETECTED NOT DETECTED Final    Comment: Performed at [facility], [place] [address], [place], [place] [address]  Culture, blood (Routine X 2) w Reflex to ID Panel     Status: None   Collection Time: 11/17/21 12:34 PM   Specimen: BLOOD  Result Value Ref Range Status   Specimen Description   Final    BLOOD LEFT ANTECUBITAL Performed at [facility] [address], [place], [place] [address]    Special Requests   Final    BOTTLES DRAWN AEROBIC AND ANAEROBIC Blood Culture adequate volume Performed at [facility] [address], [place], [place] [address]    Culture   Final    NO GROWTH 5  DAYS Performed at [facility], [place] [address], [place], [place] [address]    Report Status 11/22/2021 FINAL  Final  Culture, blood (Routine X 2) w Reflex to ID Panel     Status: None   Collection Time: 11/17/21 12:34 PM   Specimen: BLOOD  Result Value Ref Range Status   Specimen Description   Final    BLOOD BLOOD LEFT HAND Performed at [facility] [address], [place], [place] [address]    Special Requests   Final    BOTTLES DRAWN AEROBIC AND ANAEROBIC Blood Culture adequate volume Performed at [facility] [address], [place], [place] [address]    Culture   Final    NO GROWTH 5 DAYS Performed at [facility], [place] [address] [place], [place] [address]    Report Status 11/22/2021 FINAL  Final   *Note: Due to a large number of results and/or encounters for the requested time period, some results have not been displayed. A complete set of results can be found in Results Review.    Labs: CBC: Recent Labs  Lab 11/26/21 1452 11/26/21 1452 11/26/21 2320 11/27/21 0527 11/27/21 0538 11/28/21 1215 11/29/21 1118 12/02/21 1828  WBC 15.0*  --  18.5* 14.1*  --  11.2* 10.7* 9.2  NEUTROABS 12.4*  --  15.7* 11.3*  --   --   --   --   HGB 11.1*   < > 11.3* 10.1* 10.2* 8.8* 8.2* 8.8*  HCT 30.8*  --  31.1* 27.6* 30.0* 24.6* 22.2* 23.6*  MCV 96.6  --  97.8 97.9  --  100.0 99.1 100.9*  PLT 155  --  174 147*  --  129* 137* 106*   < > = values in this interval not displayed.   Basic Metabolic Panel: Recent Labs  Lab 11/26/21 2320 11/27/21 0527 11/27/21 7253 11/28/21 1215 11/29/21 1118 11/30/21 0327 12/01/21 0211  12/02/21 1828  NA 120* 121*   < > 123* 120* 120* 120* 121*  K 4.8 4.7   < > 3.5 2.8* 3.4* 3.7 4.4  CL 86* 86*  --  94* 90* 92* 90* 89*  CO2 22 24  --  22 21* 20* 19* 20*  GLUCOSE 139* 111*  --  231* 121* 118* 120* 122*  BUN 33* 33*  --  27* 17 16 29* 48*  CREATININE 1.10 1.16  --  1.04 0.74 0.79 1.10 1.26*   CALCIUM 7.8* 8.0*  --  7.2* 7.4* 7.2* 7.4* 7.8*  MG 2.1 2.0  --   --   --   --   --   --   PHOS  --   --   --   --   --   --  3.3  --    < > = values in this interval not displayed.   Liver Function Tests: Recent Labs  Lab 11/27/21 0527 11/28/21 1215 11/29/21 1118 11/30/21 0327 12/01/21 0211 12/02/21 1828  AST 136* 131* 136* 271*  --  308*  ALT 43 40 42 69*  --  83*  ALKPHOS 375* 329* 257* 270*  --  274*  BILITOT 5.2* 6.0* 6.7* 6.2*  --  6.7*  PROT 5.4* 5.0* 5.3* 5.1*  --  5.2*  ALBUMIN 1.7* 1.8* 2.4* 2.2* 2.0* 1.7*   CBG: Recent Labs  Lab 12/02/21 1141 12/02/21 1535 12/02/21 2056 12/03/21 0744 12/03/21 1114  GLUCAP 139* 151* 124* 141* 149*    Discharge time spent: greater than 30 minutes.  Signed: Gagan S Lama, MD [facility] 12/03/2021 

## 2021-12-03 NOTE — Progress Notes (Signed)
IP PROGRESS NOTE  Subjective:   [name] is alert this morning.  His wife is at the bedside.  No pain or nausea.  His wife reports he became more somnolent on Saturday after receiving morphine and not taking lactulose. Objective: Vital signs in last 24 hours: Blood pressure 110/60, pulse 92, temperature 98.2 F (36.8 C), temperature source Oral, resp. rate 19, height _0  (1.88 m), weight 196 lb 13.9 oz (89.3 kg), SpO2 99 %.  Intake/Output from previous day: No intake/output data recorded.  Physical Exam:  HEENT: Scleral icterus, no thrush   Abdomen: Mildly distended with ascites, left abdominal peritoneal drain with a saturated gauze dressing Extremities: Trace lower leg/ankle edema bilaterally Neurologic: Alert and oriented  Portacath/PICC-without erythema  Lab Results: Recent Labs    12/02/21 1828  WBC 9.2  HGB 8.8*  HCT 23.6*  PLT 106*    BMET Recent Labs    12/01/21 0211 12/02/21 1828  NA 120* 121*  K 3.7 4.4  CL 90* 89*  CO2 19* 20*  GLUCOSE 120* 122*  BUN 29* 48*  CREATININE 1.10 1.26*  CALCIUM 7.4* 7.8*    Lab Results  Component Value Date   CEA1 2.15 01/14/2020   LSL373 522 (H) 11/23/2021    Studies/Results: No results found.  Medications: I have reviewed the patient's current medications.  Assessment/Plan: Cholangiocarcinoma  multiple liver masses and abdominal lymphadenopathy CTs 01/13/2020-rounded hypodense mass appears to arise from the pancreas neck, multiple rim-enhancing masses in the liver, primarily left liver with segmental dilation of the left lobe Intermatic bile ducts, ill-defined hypodensity the central liver with effacement of the left portal vein, enlarged portacaval and retroperitoneal lymph nodes Ultrasound-guided biopsy of the left liver lesion 01/19/2020-adenocarcinoma, cytokeratin 7+, MSS, tumor mutation burden 1, IDH1 R132C MRI abdomen 01/31/2020-poorly marginated central liver mass, multiple smaller similar satellite  liver masses, extrinsic mass-effect at the biliary hilum with intrahepatic biliary ductal dilatation throughout the left liver with mild Intermatic dilatation the superior right liver, no pancreas mass or ductal dilatation, normal spleen size, numerous enlarged enhancing lymph nodes at the porta hepatis, peripancreatic, portacaval, aortocaval, left periaortic chains Cycle 1 gemcitabine/cisplatin 02/04/2020 Cycle 2 gemcitabine/cisplatin 02/28/2020 Cycle 3 gemcitabine/cisplatin 03/20/2020 CT abdomen/pelvis 03/31/2020-mild decrease in central liver tumor, mild decrease in size of liver metastases and upper abdominal adenopathy, new splenomegaly Cycle 4 gemcitabine/cisplatin 04/10/2020 Cycle 5 gemcitabine/cisplatin 05/01/2020 Cycle 6 gemcitabine/cisplatin plus durvalumab 05/22/2020 CTs 06/06/2020- dominant central liver mass mildly enlarged, other liver lesions and abdominal adenopathy is stable, no evidence of metastatic disease to the chest Cycle 7 gemcitabine/cisplatin plus Durvalumab 06/12/2020 Cycle 8 gemcitabine/cisplatin plus Durvalumab 06/30/2020 Cycle 9 gemcitabine/cisplatin plus Durvalumab 07/31/2020 CT abdomen/pelvis 08/20/2020-decreased size of dominant central liver mass, slight increase in size and stable additional liver lesions, stable portacaval node Cycle 10 gemcitabine/cisplatin plus Durvalumab 08/21/2020 Cycle 11 gemcitabine/cisplatin plus Durvalumab 09/11/2020 Cycle 12 gemcitabine/cisplatin plus Durvalumab 10/02/2020 Cycle 13 gemcitabine/cisplatin plus Durvalumab 10/23/2020 CT abdomen/pelvis 11/10/2020-no change in dominant central liver mass and additional liver lesions, stable upper retroperitoneal adenopathy Cycle 14 gemcitabine/cisplatin plus Durvalumab 11/13/2020 Cycle 15 gemcitabine/cisplatin plus Durvalumab 12/04/2020 Cycle 15 gemcitabine/cisplatin plus Durvalumab 01/08/2021 Cycle 16 gemcitabine/cisplatin plus Durvalumab 01/30/2021-cisplatin held from day 1 and day 8 secondary to  neuropathy CTs 02/15/2021-slight increase in size of central liver mass, progressive metastatic lesions in the right liver, stable to slightly decreased periportal lymph nodes, progression of splenomegaly, chronically thrombosed left portal vein Cycle 1 FOLFOX 03/05/2021 Cycle 2 FOLFOX 03/19/2021, oxaliplatin dose reduced due to thrombocytopenia Cycle 3 FOLFOX  04/02/2021 Cycle 4 FOLFOX 04/16/2021 Cycle 5 FOLFOX 04/30/2021 CTs 05/10/2021-new and enlarging liver metastases, chronic left lobe biliary dilatation, chronic left portal vein thrombosis, stable porta hepatis adenopathy, splenomegaly, no evidence of metastatic disease to the chest Ivosidenib 05/21/2021 CT 07/13/2021-increase in size of periportal node, stable retroperitoneal nodes, dominant left hepatic mass-stable, increased necrosis of other liver lesions, some have increased in size Ivosidenib continued CT abdomen/pelvis 09/06/2021-progression of ascites, progression of liver metastases, stable necrotic lymphadenopathy in the periportal region Ivosedinib discontinued 09/07/2021 Cycle 1 FOLFIRI 09/24/2021 Cycle 2 held on 10/10/2021 due to neutropenia, elevated bilirubin Cycle 2 FOLFIRI 10/22/2021 Cycle 3 FOLFIRI 11/05/2021 CT abdomen/pelvis 11/28/2021-small pulmonary nodules at the lung bases, multiple hepatic metastases-unchanged, stable necrotic periportal nodes   Cough-likely related to diaphragmatic irritation from #1 Anorexia/weight loss Hypercalcemia-likely hypercalcemia malignancy, status post intravenous hydration and Zometa 01/14/2020 Diabetes Hyperlipidemia Hypertension History of peripheral neuropathy secondary to diabetes Severe back pain following Fulphila-oxycodone prescribed Thrombocytopenia secondary to chemotherapy-gemcitabine held with day 1 cycle 8 and then dose reduced COVID-19 infection 07/22/2020, 12/25/2020 Cisplatin induced peripheral neuropathy Admission 11/15/2021 with oropharyngeal candidiasis, mucositis?,  Neutropenia,  and multiple electrolyte abnormalities Admission 11/28/2021 with hyponatremia, hepatic encephalopathy, nausea/vomiting  [name] is alert and appears comfortable this morning.  He plans to return home with hospice care.  He would like to remain on a full CODE STATUS. Recommendations: Continue lactulose, titrate dose to mental status Discharge to home with hospice care, I will serve as the primary provider with the [facility] hospice team Drain peritoneal fluid as needed for comfort Outpatient follow-up will be scheduled next week at the cancer center Advance diet as tolerated     LOS: 6 days   Betsy Coder, MD   12/03/2021, 7:58 AM

## 2021-12-03 NOTE — TOC Transition Note (Signed)
Transition of Care [facility]) - CM/SW Discharge Note   Patient Details  Name: Matthew Osborne MRN: [id number] Date of Birth: 05/24/1975  Transition [facility]) CM/SW Contact:  Bartholomew Crews, RN Phone Number: [phone] 12/03/2021, 1:41 PM   Clinical Narrative:     Damaris Schooner with Raul Del at [place] to follow up with referral. Keke to follow up with spouse today and will plan for nursing admission visit in the morning. Updated on anticipated DME needs to include hospital bed, BSC, overbed table, and wheelchair. Updated spouse. Patient to transition home today in private vehicle.   Final next level of care: Home w Hospice Care Barriers to Discharge: No Barriers Identified   Patient Goals and CMS Choice Patient states their goals for this hospitalization and ongoing recovery are:: wife wants to bring him home asap CMS Medicare.gov Compare Post Acute Care list provided to:: Patient Represenative (must comment) Choice offered to / list presented to : Spouse, Patient  Discharge Placement                       Discharge Plan and Services   Discharge Planning Services: CM Consult            DME Arranged: N/A DME Agency: NA                  Social Determinants of Health (SDOH) Interventions     Readmission Risk Interventions     No data to display

## 2021-12-03 NOTE — Plan of Care (Signed)

## 2021-12-03 NOTE — Progress Notes (Signed)
Physical Therapy Treatment Patient Details Name: Matthew Osborne MRN: [id number] DOB: [birth year] Today's Date: 12/03/2021   History of Present Illness 46 y/o male presented to ED on 11/26/21 for emesis and low sodium as well as AMS with fall in bathroom. Recent admission at [facility] 10/12-10/16 for diet aphasia with dx of esophageal candidiasis. Admitted for acute metabolic encephalopathy suspected hepatic in nature. 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, chronic hyponatremia, T2DM    PT Comments    Today's session focused on discussion re: equipment and assist recs to dc home; recommend wheelchair and pt and wife agree; they report they have plenty of assist at home; discussed technique for going up the 2 steps to enter their home with wheelchair and 2 person assist; opted not to mobilize OOB in an effor tto save energy for trip home; Discussed rec for wc with TOC RN; Provided pt with geomat pressure redistribution chair pad  Recommendations for follow up therapy are one component of a multi-disciplinary discharge planning process, led by the attending physician.  Recommendations may be updated based on patient status, additional functional criteria and insurance authorization.  Follow Up Recommendations  Home health PT (I believe Hospice services would setup for HHPT -- HHPT can be Palliative in nature if pt has goals that are congruent with PT; but dc home is not wholely contingent on having HHPT follow up)     Assistance Recommended at Discharge Frequent or constant Supervision/Assistance  Patient can return home with the following A lot of help with walking and/or transfers;A lot of help with bathing/dressing/bathroom;Assistance with cooking/housework;Assist for transportation;Help with stairs or ramp for entrance;Direct supervision/assist for medications management;Direct supervision/assist for financial management   Equipment Recommendations  BSC/3in1;Wheelchair  (measurements PT);Wheelchair cushion (measurements PT)    Recommendations for Other Services       Precautions / Restrictions Precautions Precautions: Fall Precaution Comments: hx of falls Restrictions Weight Bearing Restrictions: No     Mobility    Balance   Cognition Arousal/Alertness: Awake/alert Behavior During Therapy: WFL for tasks assessed/performed Overall Cognitive Status: Within Functional Limits for tasks assessed (for simple discussion related to getting home)                                 General Comments: Patient reports getting confused at times. Answers all questions appropriately. Easily fatigued        Exercises      General Comments        Pertinent Vitals/Pain Pain Assessment Pain Assessment: No/denies pain    Home Living                          Prior Function            PT Goals (current goals can now be found in the care plan section) Acute Rehab PT Goals Patient Stated Goal: Home today; Pt and spouse agree with getting a wheelchair PT Goal Formulation: With patient/family Time For Goal Achievement: 12/14/21 Potential to Achieve Goals: Fair Progress towards PT goals: Progressing toward goals (getting appropriate equipment for getting home)    Frequency    Min 3X/week      PT Plan Current plan remains appropriate;Equipment recommendations need to be updated    Co-evaluation              AM-PAC PT "6 Clicks" Mobility   Outcome Measure  Help needed  turning from your back to your side while in a flat bed without using bedrails?: A Little Help needed moving from lying on your back to sitting on the side of a flat bed without using bedrails?: A Little Help needed moving to and from a bed to a chair (including a wheelchair)?: Total Help needed standing up from a chair using your arms (e.g., wheelchair or bedside chair)?: Total Help needed to walk in hospital room?: Total Help needed climbing 3-5  steps with a railing? : Total 6 Click Score: 10    End of Session   Activity Tolerance: Other (comment) (Opted to save energy for teh ride home) Patient left: in bed;with call bell/phone within reach;with family/visitor present   PT Visit Diagnosis: Unsteadiness on feet (R26.81);Other abnormalities of gait and mobility (R26.89);Muscle weakness (generalized) (M62.81);History of falling (Z91.81)     Time: 3875-6433 PT Time Calculation (min) (ACUTE ONLY): 12 min  Charges:  $Self Care/Home Management: [place], [place] Office [phone]    Colletta [place] 12/03/2021, 11:43 AM

## 2021-12-03 NOTE — Plan of Care (Signed)

## 2021-12-03 NOTE — Progress Notes (Signed)
DISCHARGE NOTE HOME Matthew Osborne to be discharged Home per MD order. Discussed prescriptions and follow up appointments with the patient and wife medication list explained in detail. Patient and wife verbalized understanding.  Skin clean, dry and intact without evidence of skin break down, no evidence of skin tears noted. IV catheter discontinued intact. Site without signs and symptoms of complications. Dressing and pressure applied. Pt denies pain at the site currently. No complaints noted.  Patient free of lines, drains, and wounds.   An After Visit Summary (AVS) was printed and given to the patient. Patient escorted via wheelchair, and discharged home via private auto.  Anastasio Auerbach, RN

## 2021-12-04 ENCOUNTER — Other Ambulatory Visit (HOSPITAL_BASED_OUTPATIENT_CLINIC_OR_DEPARTMENT_OTHER): Payer: Self-pay

## 2021-12-04 LAB — CYTOLOGY - NON PAP

## 2021-12-05 ENCOUNTER — Encounter: Payer: Self-pay | Admitting: General Practice

## 2021-12-05 ENCOUNTER — Inpatient Hospital Stay: Payer: No Typology Code available for payment source

## 2021-12-05 ENCOUNTER — Encounter: Payer: Self-pay | Admitting: *Deleted

## 2021-12-05 NOTE — Progress Notes (Signed)
[facility] Spiritual Care Note  Followed up with Jerman's wife [name] by phone now that he has been discharged and is home with hospice.   Provided empathic listening and emotional support as [name] shared and processed updates, including sources of support as they begin to navigate the next steps.   Due to an interruption at home, we plan to follow up next week for another pastoral check-in.   [place], [place], [facility] Pager [phone] Voicemail [phone]

## 2021-12-05 NOTE — Progress Notes (Signed)
Faxed hospital discharge summary for 11/26/21 to 12/03/21 to [facility] [phone].

## 2021-12-07 ENCOUNTER — Inpatient Hospital Stay: Payer: No Typology Code available for payment source | Admitting: Oncology

## 2021-12-07 ENCOUNTER — Telehealth: Payer: Self-pay | Admitting: *Deleted

## 2021-12-07 NOTE — Telephone Encounter (Signed)
Called Mrs. Intrieri to f/u on status. She reports he is comfortable. Sleeping a lot. Able to drink Ensure/Boost and yogurt shakes. Able to ambulate w/assistance due to being weak. They have all the necessary equipment in home and are pleased with Hospice care.

## 2021-12-13 ENCOUNTER — Encounter: Payer: Self-pay | Admitting: General Practice

## 2021-12-13 NOTE — Progress Notes (Signed)
[facility] Spiritual Care Note  Continue to follow Matthew Osborne's wife Matthew Osborne, whom I know from working in [place], for emotional support. Reached her by phone, providing opportunity for her to share and process updates, including how their children are doing. Encouraged Karen's efforts to find support resources for them, making additional suggestions as well, including [email or web address]'s Find a Optician, dispensing, in the event that [place] doesn't work out.  Will continue to follow. Matthew Osborne has chaplain's direct number, and we plan to follow up by phone in ca two weeks.   [place], [place], [facility] Pager [phone] Voicemail [phone]

## 2021-12-14 ENCOUNTER — Other Ambulatory Visit (HOSPITAL_BASED_OUTPATIENT_CLINIC_OR_DEPARTMENT_OTHER): Payer: Self-pay

## 2021-12-14 ENCOUNTER — Encounter: Payer: Self-pay | Admitting: Oncology

## 2021-12-14 ENCOUNTER — Other Ambulatory Visit: Payer: Self-pay | Admitting: Oncology

## 2021-12-15 ENCOUNTER — Other Ambulatory Visit (HOSPITAL_BASED_OUTPATIENT_CLINIC_OR_DEPARTMENT_OTHER): Payer: Self-pay

## 2021-12-15 MED ORDER — PROCHLORPERAZINE MALEATE 10 MG PO TABS
10.0000 mg | ORAL_TABLET | Freq: Four times a day (QID) | ORAL | 1 refills | Status: AC | PRN
  Filled 2021-12-15: qty 60, 15d supply, fill #0

## 2021-12-17 ENCOUNTER — Other Ambulatory Visit (HOSPITAL_BASED_OUTPATIENT_CLINIC_OR_DEPARTMENT_OTHER): Payer: Self-pay

## 2021-12-17 ENCOUNTER — Other Ambulatory Visit: Payer: Self-pay | Admitting: *Deleted

## 2021-12-17 MED ORDER — LORAZEPAM 0.5 MG PO TABS
0.5000 mg | ORAL_TABLET | Freq: Every evening | ORAL | 1 refills | Status: AC | PRN
  Filled 2021-12-17: qty 30, 30d supply, fill #0

## 2021-12-17 MED ORDER — LORAZEPAM 0.5 MG PO TABS: 0.5000 mg | ORAL_TABLET | Freq: Every evening | ORAL | 1 refills | Status: AC | PRN

## 2021-12-17 MED ORDER — ONDANSETRON 8 MG PO TBDP
8.0000 mg | ORAL_TABLET | Freq: Three times a day (TID) | ORAL | 1 refills | Status: AC | PRN
  Filled 2021-12-17: qty 30, 10d supply, fill #0

## 2021-12-18 ENCOUNTER — Other Ambulatory Visit (HOSPITAL_BASED_OUTPATIENT_CLINIC_OR_DEPARTMENT_OTHER): Payer: Self-pay

## 2021-12-19 ENCOUNTER — Other Ambulatory Visit (HOSPITAL_BASED_OUTPATIENT_CLINIC_OR_DEPARTMENT_OTHER): Payer: Self-pay

## 2022-01-01 ENCOUNTER — Telehealth: Payer: Self-pay

## 2022-01-01 NOTE — Telephone Encounter (Signed)
Received a call from Elmarie Shiley with the [facility], who is in charge of the death certificates, asks if a supplement can be added to Matthew Osborne's Death Certificate? The original certificate states, "malignant" but cholangiocarcinoma metastatic to liver will need to be added so that the certificate can be validated. Thank you.

## 2022-01-04 DEATH — deceased

## 2022-02-13 ENCOUNTER — Other Ambulatory Visit (HOSPITAL_COMMUNITY): Payer: Self-pay

## 2022-02-25 IMAGING — CT CT CHEST W/ CM
2 of 3 series · 13 of 36 positions shown, 16 images · IV contrast (omnipaque)
Comparison: CT chest angiogram, 01/18/2013

CLINICAL DATA: Unintended weight loss, elevated LFT

EXAM:
CT CHEST, ABDOMEN, AND PELVIS WITH CONTRAST
TECHNIQUE: Multidetector CT imaging of the chest, abdomen and pelvis was
performed following the standard protocol during bolus
administration of intravenous contrast.
CONTRAST:  100mL OMNIPAQUE IOHEXOL 300 MG/ML  SOLN

[Series 4: chest with 2mm st · axial · 0.80mm/px · z∈[+1072,+1324]mm · 10 of 150 slices shown, 13 images]
[im 12/150  mediastinal]
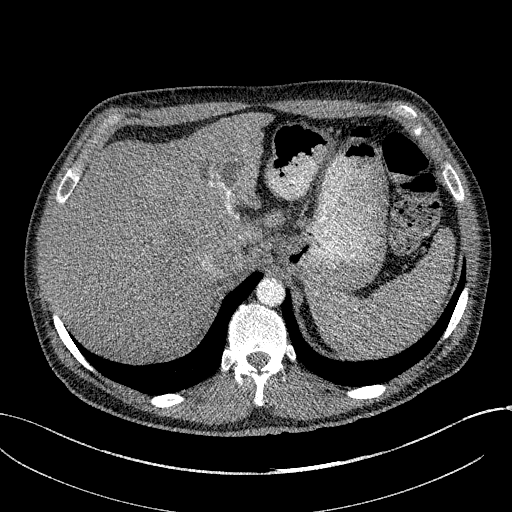
[im 12/150  lung]
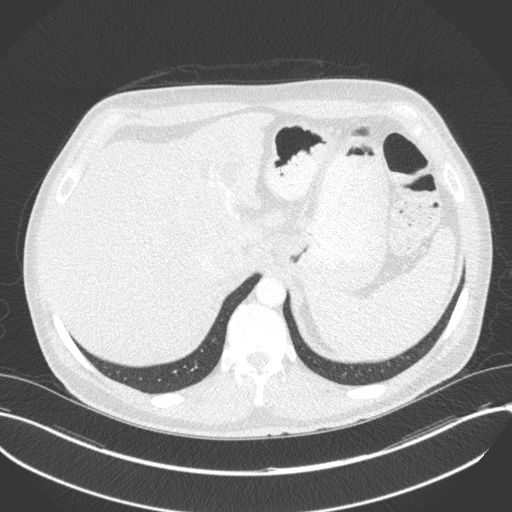
[im 23/150  lung]
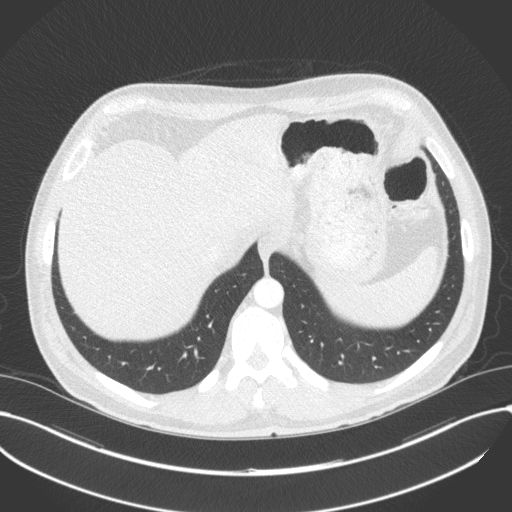
[im 39/150  lung]
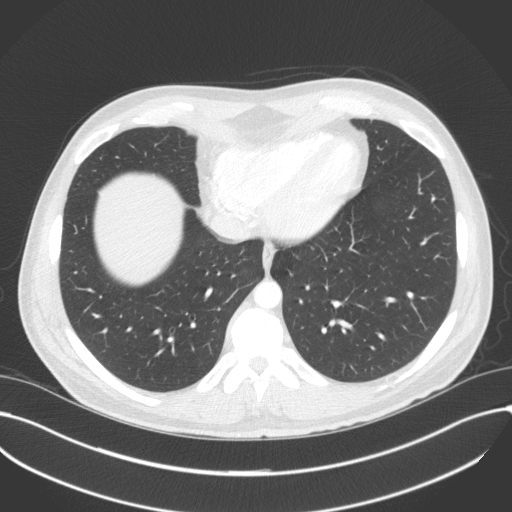
[im 56/150  lung]
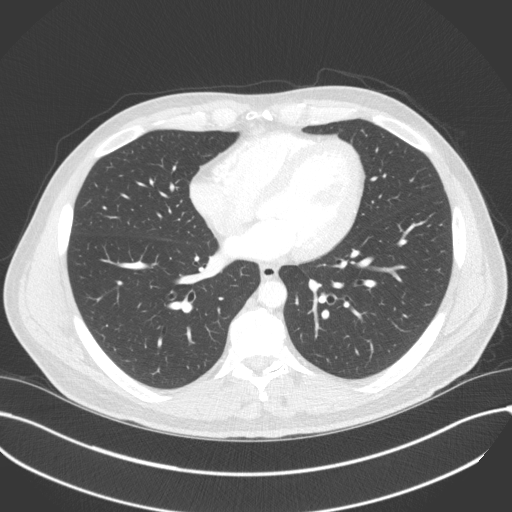
[im 67/150  mediastinal]
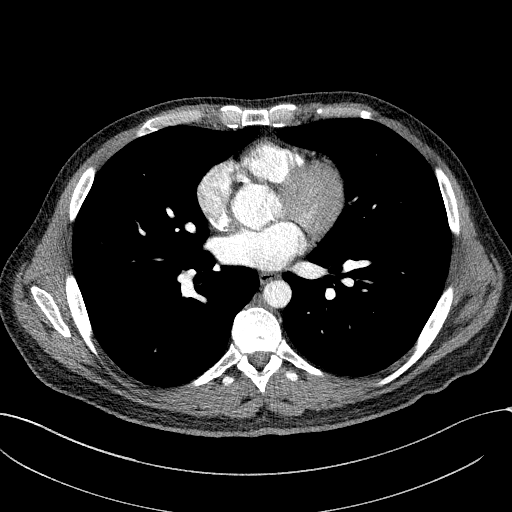
[im 67/150  lung]
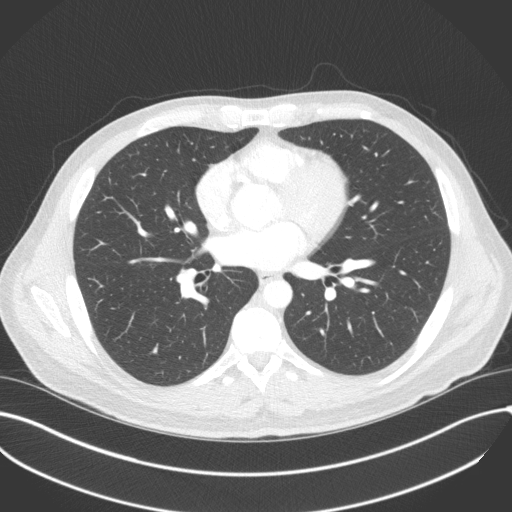
[im 83/150  lung]
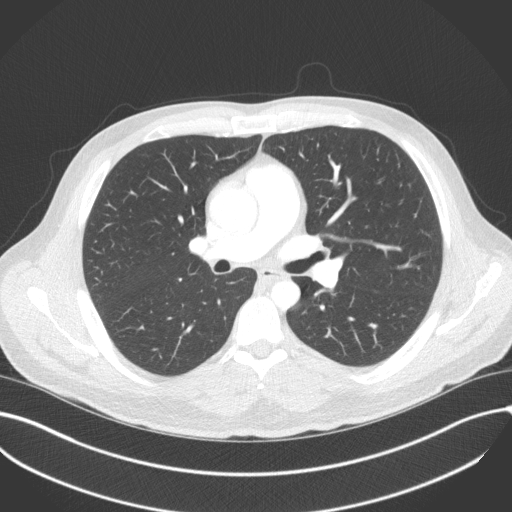
[im 94/150  lung]
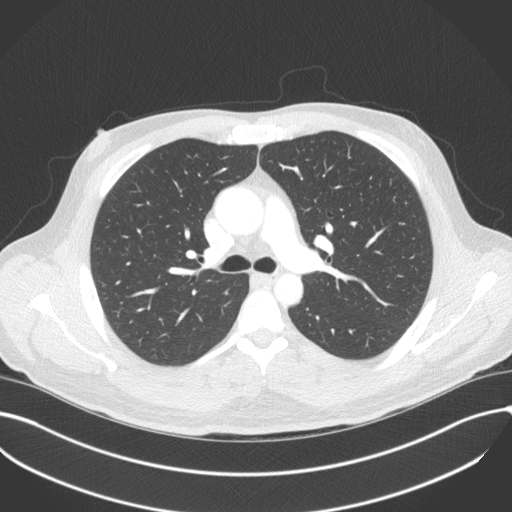
[im 111/150  lung]
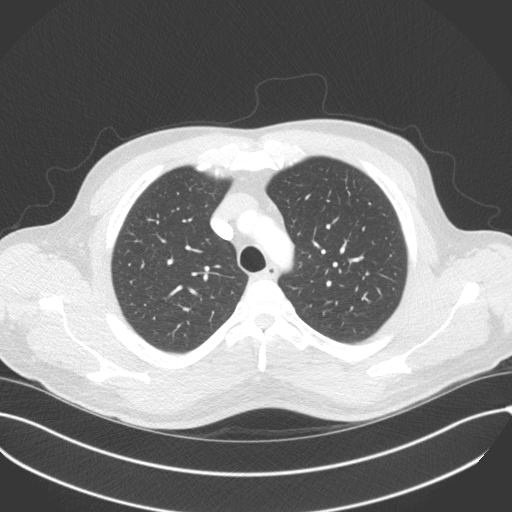
[im 127/150  mediastinal]
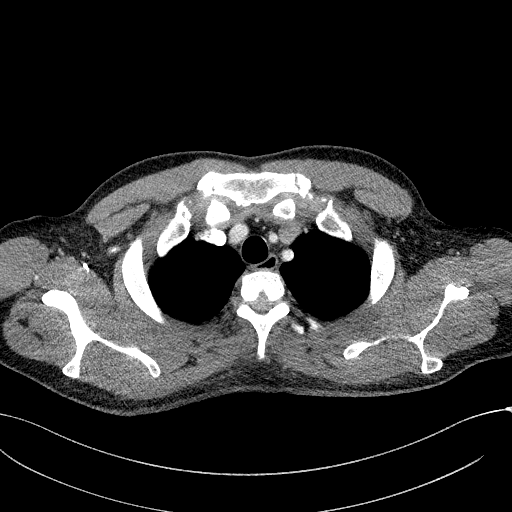
[im 127/150  lung]
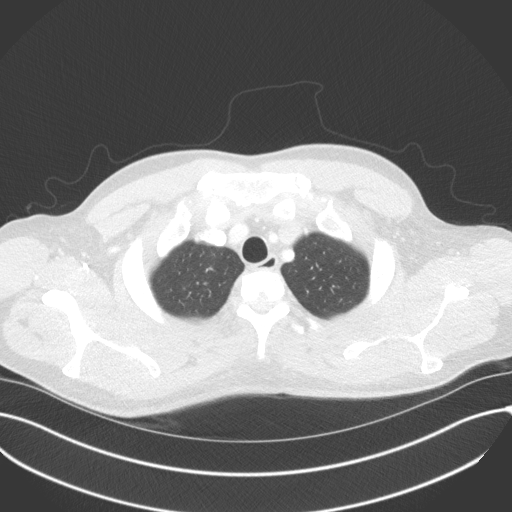
[im 138/150  lung]
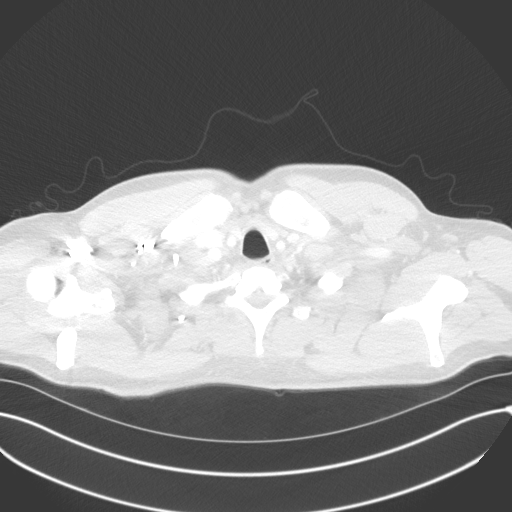

[Series 7: chest with 2mm st cor · coronal · 0.59mm/px · 3 of 151 slices shown]
[im 31/151  lung]
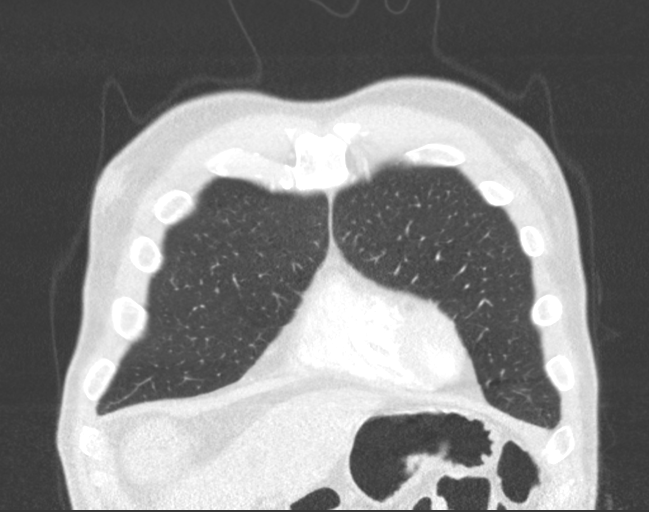
[im 61/151  lung]
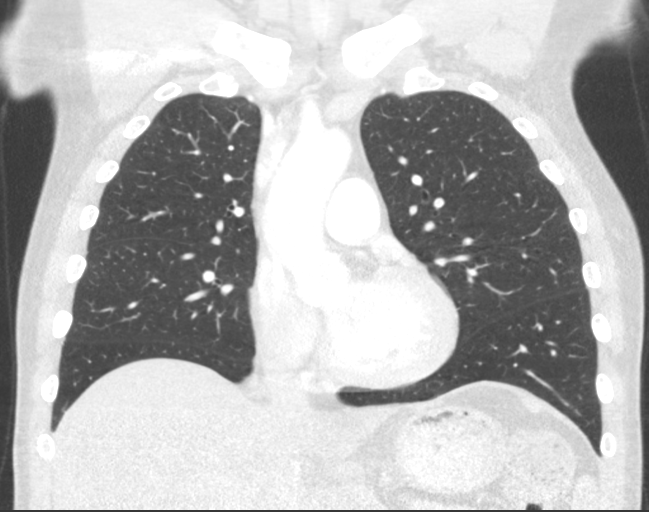
[im 91/151  lung]
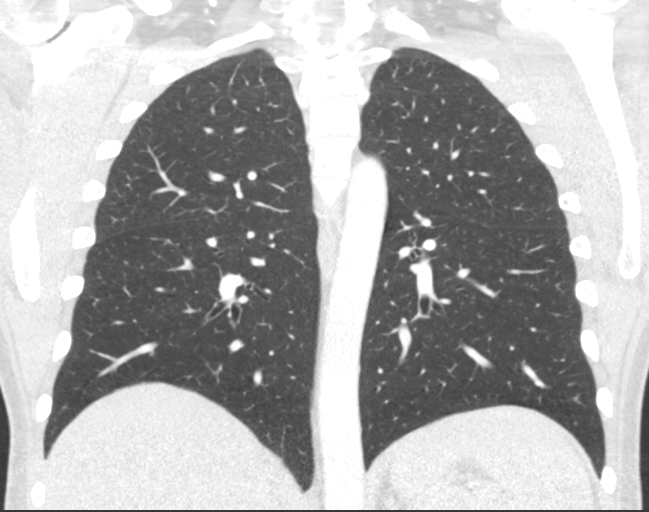

[13 of 36 positions shown; findings below may reference images not displayed]

FINDINGS: CT CHEST FINDINGS

Cardiovascular: Left and right coronary artery calcifications and/or
stents. Normal heart size. No pericardial effusion.

Mediastinum/Nodes: No enlarged mediastinal, hilar, or axillary lymph
nodes. Thyroid gland, trachea, and esophagus demonstrate no
significant findings.

Lungs/Pleura: Lungs are clear. No pleural effusion or pneumothorax.

Musculoskeletal: No chest wall mass or suspicious bone lesions
identified.

CT ABDOMEN PELVIS FINDINGS

Hepatobiliary: Multiple rim enhancing masses of the liver, primarily
in the left lobe, with atrophy of the left lobe and segmental
dilatation of the left lobe intrahepatic bile ducts (series 4, image
16). The largest index mass of the anterior left lobe of the liver
measures 3.4 x 2.9 cm (series 4, image 21). There is very
ill-defined hypodensity of the central liver with effacement of the
left portal vein (series 4, image 17). No gallstones, gallbladder
wall thickening, or biliary dilatation.

Pancreas: There is a rounded, hypodense mass of the pancreatic neck
measuring 4.4 x 3.4 cm no pancreatic ductal dilatation or
surrounding inflammatory changes.

Spleen: Normal in size without significant abnormality.

Adrenals/Urinary Tract: Adrenal glands are unremarkable. Kidneys are
normal, without renal calculi, solid lesion, or hydronephrosis.
Bladder is unremarkable.

Stomach/Bowel: Stomach is within normal limits. Appendix appears
normal. No evidence of bowel wall thickening, distention, or
inflammatory changes.

Vascular/Lymphatic: No significant vascular findings are present.
Enlarged portacaval lymph nodes measuring up to 4.5 x 3.0 cm (series
4, image 24). Numerous enlarged retroperitoneal lymph nodes
measuring up to 1.9 x 1.1 cm (series 4, image 34).

Reproductive: No mass or other abnormality.

Other: No abdominal wall hernia or abnormality. No abdominopelvic
ascites.

Musculoskeletal: No acute or significant osseous findings.
IMPRESSION: 1. There is a rounded, hypodense mass which appears to arise from
the pancreatic neck measuring 4.4 x 3.4 cm.
2. Multiple rim enhancing masses of the liver, primarily in the left
lobe, with atrophy of the left lobe and segmental dilatation of the
left lobe intrahepatic bile ducts.
3. There is very ill-defined hypodensity of the central liver with
effacement of the left portal vein. Findings are consistent with
metastatic disease.
4. Numerous enlarged portacaval and retroperitoneal lymph nodes.
5. Constellation of findings above most likely reflects primary
pancreatic adenocarcinoma with hepatic and nodal metastatic disease,
although ill-defined hypodensity of the central liver and portal
vein effacement with biliary obstruction raises a significant
differential consideration of cholangiocarcinoma and associated
metastatic disease. Suspected primary mass of the pancreatic neck
may therefore reflect a nodal metastasis which does not truly arise
from the pancreatic parenchyma. Multiphasic contrast enhanced MRI
may be helpful to better assess and characterize disease.
6. No evidence of distant metastatic disease in the chest.
7. Coronary artery disease.

These results will be called to the ordering clinician or
representative by the Radiologist Assistant, and communication
documented in the PACS or [REDACTED].

## 2022-03-16 IMAGING — XA IR IMAGING GUIDED PORT INSERTION
1 series · 1 of 1 positions shown · non-contrast
Comparison: none

INDICATION: 44-year-old male with recently diagnosed cholangiocarcinoma. He
requires durable venous access for initiation of chemotherapy.

[Series 1: fl (-) angio · 1 of 1 slices shown]
[im 1/1]
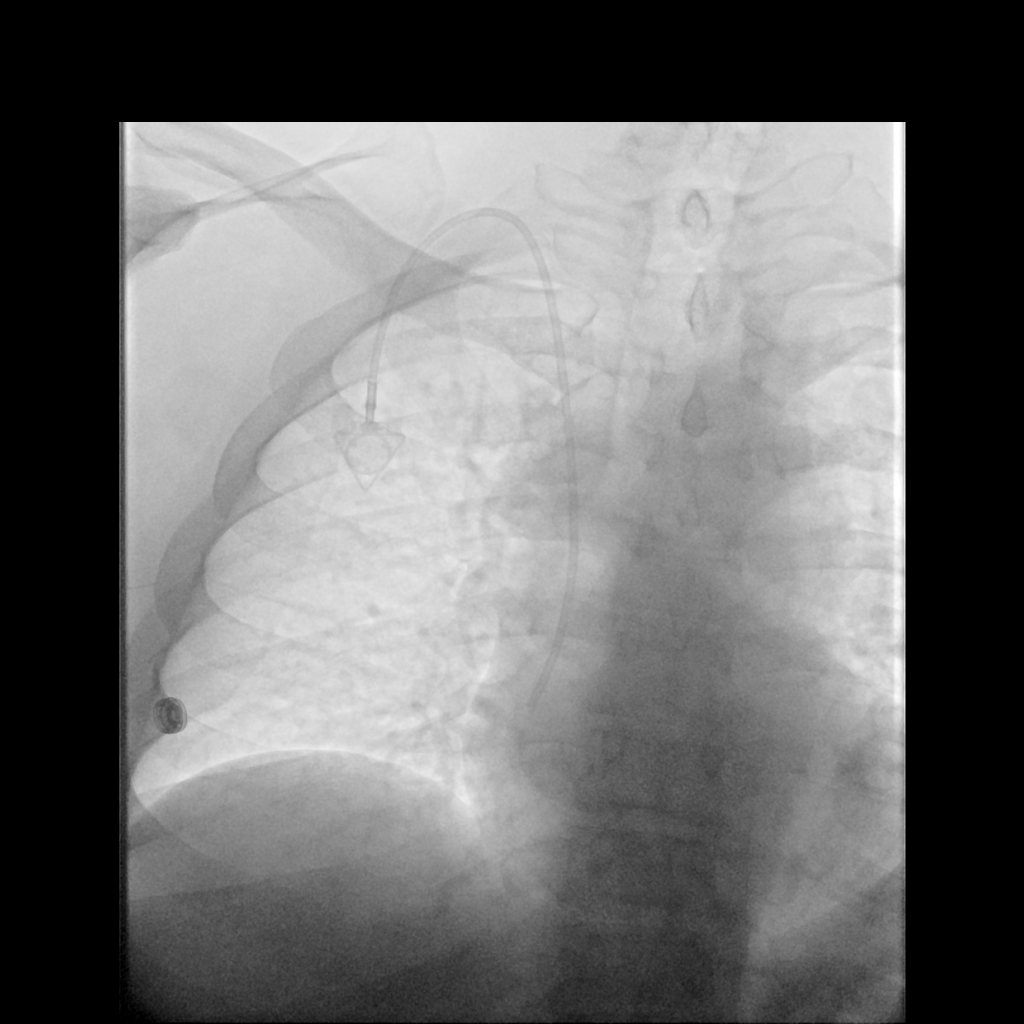

[1 of 1 positions shown; findings below may reference images not displayed]

EXAM:
IMPLANTED PORT A CATH PLACEMENT WITH ULTRASOUND AND FLUOROSCOPIC
GUIDANCE

MEDICATIONS:
2 g Ancef; The antibiotic was administered within an appropriate
time interval prior to skin puncture.

ANESTHESIA/SEDATION:
Versed 1.5 mg IV; Fentanyl 50 mcg IV;

Moderate Sedation Time:  32 minutes

The patient was continuously monitored during the procedure by the
interventional radiology nurse under my direct supervision.

FLUOROSCOPY TIME:  0 minutes, 18 seconds (2 mGy)

COMPLICATIONS:
None immediate.

PROCEDURE:
The right neck and chest was prepped with chlorhexidine, and draped
in the usual sterile fashion using maximum barrier technique (cap
and mask, sterile gown, sterile gloves, large sterile sheet, hand
hygiene and cutaneous antiseptic). Local anesthesia was attained by
infiltration with 1% lidocaine with epinephrine.

Ultrasound demonstrated patency of the right internal jugular vein,
and this was documented with an image. Under real-time ultrasound
guidance, this vein was accessed with a 21 gauge micropuncture
needle and image documentation was performed. A small dermatotomy
was made at the access site with an 11 scalpel. A 0.018" wire was
advanced into the SVC and the access needle exchanged for a 4F
micropuncture vascular sheath. The 0.018" wire was then removed and
a 0.035" wire advanced into the IVC.



The venous access site was then serially dilated and a peel away
vascular sheath placed over the wire. The wire was removed and the
port catheter advanced into position under fluoroscopic guidance.
The catheter tip is positioned in the superior cavoatrial junction.
This was documented with a spot image. The portacatheter was then
tested and found to flush and aspirate well. The port was flushed
with saline followed by 100 units/mL heparinized saline.

The pocket was then closed in two layers using first subdermal
inverted interrupted absorbable sutures followed by a running
subcuticular suture. The epidermis was then sealed with Dermabond.
The dermatotomy at the venous access site was also closed with
Dermabond.
IMPRESSION: Successful placement of a right IJ approach Power Port with
ultrasound and fluoroscopic guidance. The catheter is ready for use.

## 2022-04-17 ENCOUNTER — Other Ambulatory Visit: Payer: Self-pay

## 2022-12-25 NOTE — Telephone Encounter (Signed)
Telephone call  

## 2023-02-19 ENCOUNTER — Other Ambulatory Visit (HOSPITAL_BASED_OUTPATIENT_CLINIC_OR_DEPARTMENT_OTHER): Payer: Self-pay

## 2023-04-07 ENCOUNTER — Other Ambulatory Visit: Payer: Self-pay | Admitting: Oncology

## 2023-08-26 IMAGING — CT CT CHEST-ABD-PELV W/ CM
2 of 4 series · 14 of 46 positions shown, 16 images · IV contrast (APPLIED)
Comparison: 05/10/2021.

CLINICAL DATA: Stage IV biliary cancer, cough, assess treatment
response. * Tracking Code: BO *

EXAM:
CT CHEST, ABDOMEN, AND PELVIS WITH CONTRAST
TECHNIQUE: Multidetector CT imaging of the chest, abdomen and pelvis was
performed following the standard protocol during bolus
administration of intravenous contrast.

[Series 2: cap with · axial · 0.85mm/px · z∈[+802,+1407]mm · 11 of 143 slices shown, 13 images]
[im 11/143  soft-tissue]
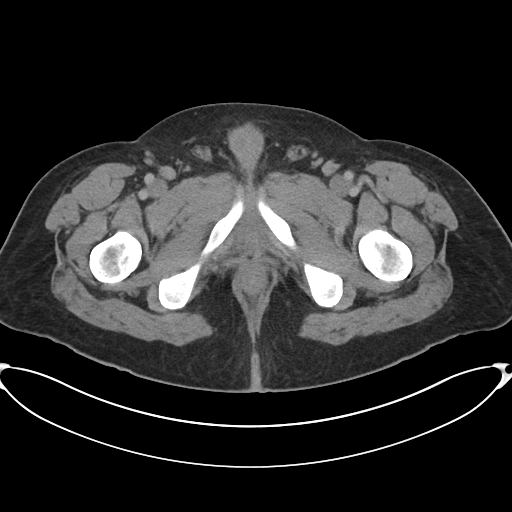
[im 11/143  bone]
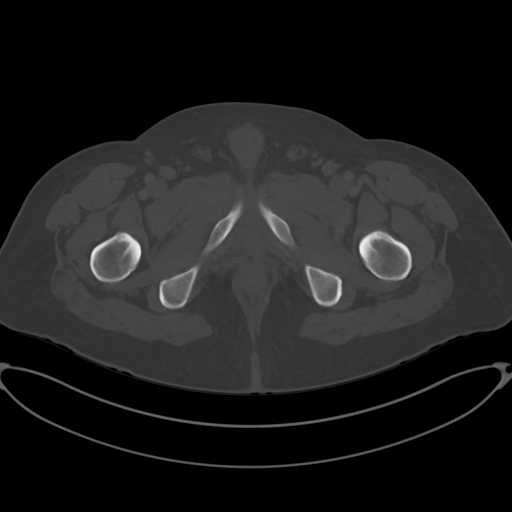
[im 21/143  soft-tissue]
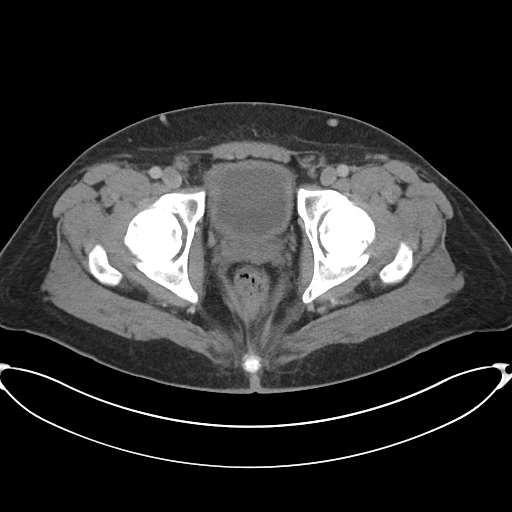
[im 31/143  soft-tissue]
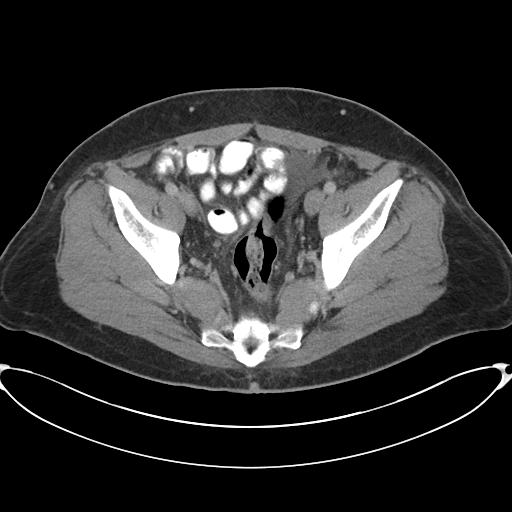
[im 51/143  soft-tissue]
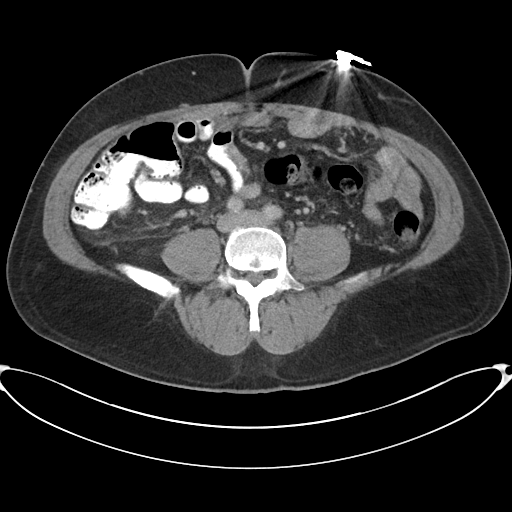
[im 61/143  soft-tissue]
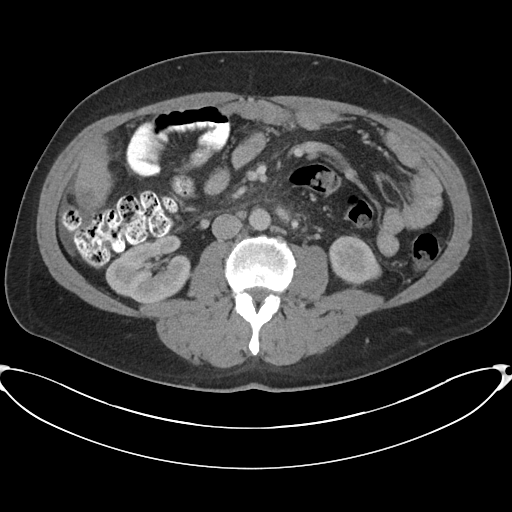
[im 72/143  soft-tissue]
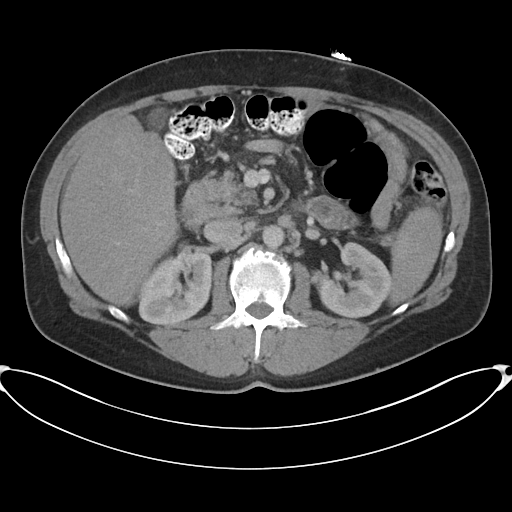
[im 82/143  soft-tissue]
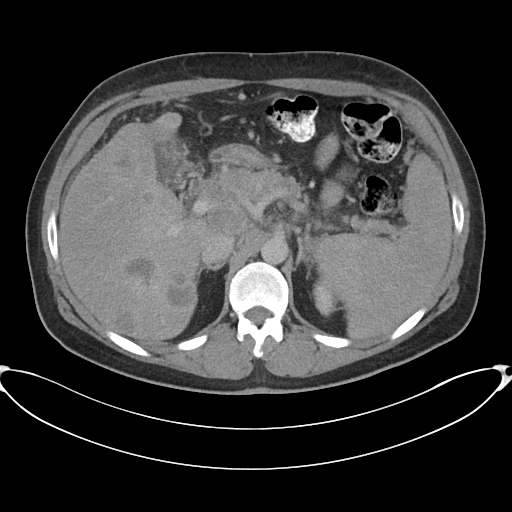
[im 92/143  soft-tissue]
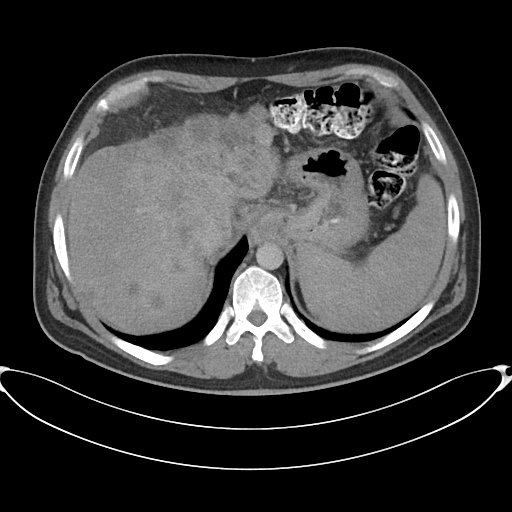
[im 112/143  soft-tissue]
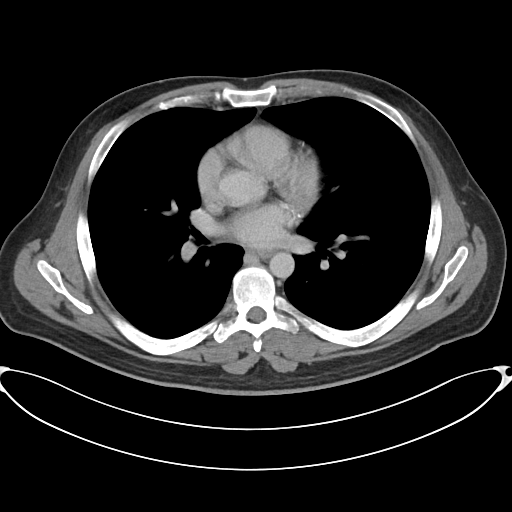
[im 112/143  bone]
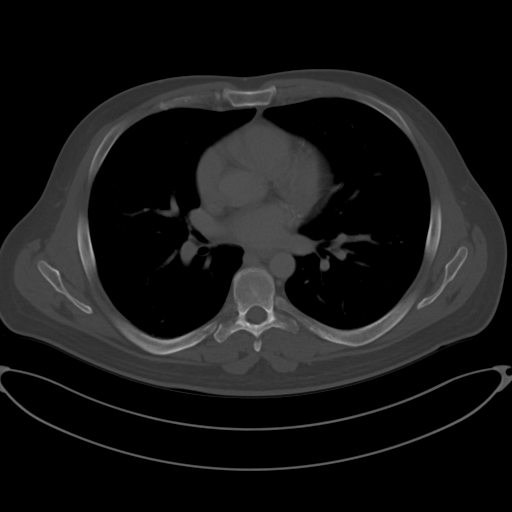
[im 122/143  soft-tissue]
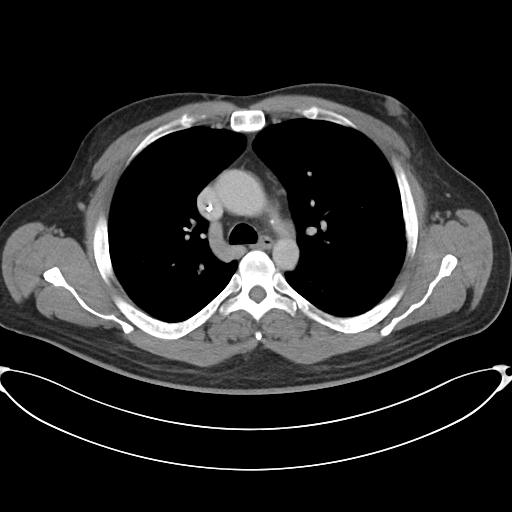
[im 132/143  soft-tissue]
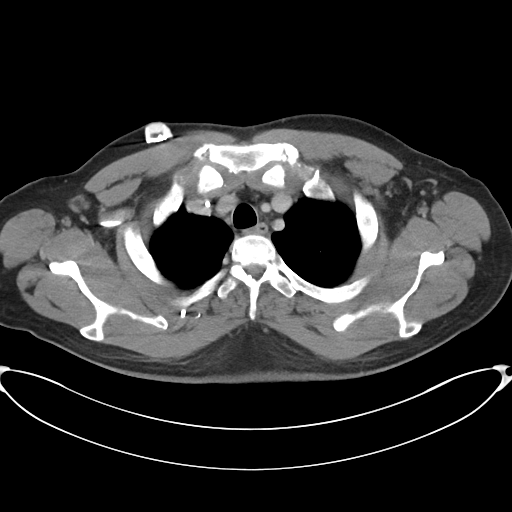

[Series 5: coronal · coronal · 0.82mm/px · 3 of 102 slices shown]
[im 34/102  soft-tissue]
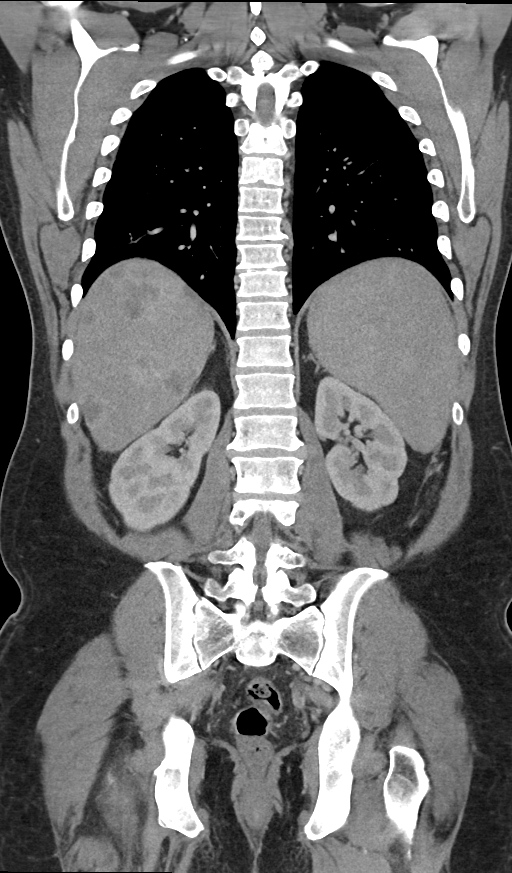
[im 45/102  soft-tissue]
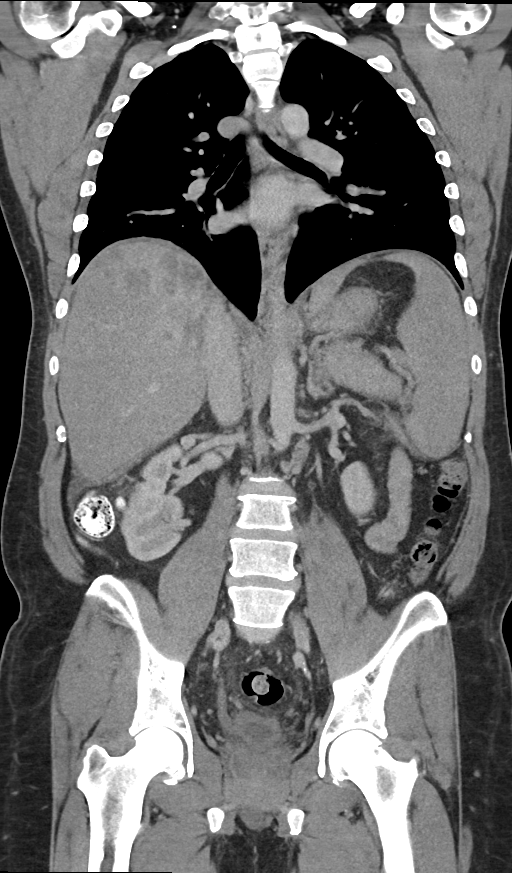
[im 57/102  soft-tissue]
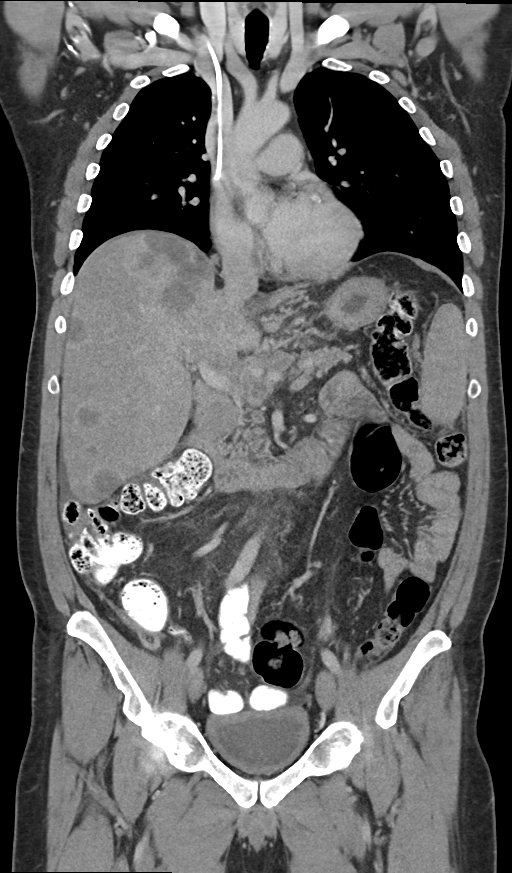

[14 of 46 positions shown; findings below may reference images not displayed]

RADIATION DOSE REDUCTION: This exam was performed according to the
departmental dose-optimization program which includes automated
exposure control, adjustment of the mA and/or kV according to
patient size and/or use of iterative reconstruction technique.

CONTRAST:  100mL OMNIPAQUE IOHEXOL 300 MG/ML  SOLN
FINDINGS: CT CHEST FINDINGS

Cardiovascular: Right IJ Port-A-Cath terminates at the SVC RA
junction. Atherosclerotic calcification of the aorta, aortic valve
and coronary arteries. Heart size normal. No pericardial effusion.

Mediastinum/Nodes: Mediastinal lymph nodes are not enlarged by CT
size criteria. No hilar or axillary adenopathy. 11 mm prepericardiac
lymph node (2/41), stable. Esophagus is grossly unremarkable.

Lungs/Pleura: Minimal scarring in the lingula. Lungs are otherwise
clear. No pleural fluid. Airway is unremarkable.

Musculoskeletal: No worrisome lytic or sclerotic lesions.

CT ABDOMEN PELVIS FINDINGS

Hepatobiliary: Heterogeneous nodular left hepatic lobe mass is
grossly similar in size, 11.8 x 13.0 cm, compared to 11.5 x 12.5 cm
on 05/10/2021. However, other lesions throughout the liver appear
increasingly necrotic and some have increased in size. Index lesion
in the posterior right hepatic lobe measures 3.5 x 4.0 cm (2/61),
previously 2.9 x 3.1 cm on 05/10/2021. A second index lesion in the
dome of the liver measures approximately 3.8 x 6.3 cm (2/49),
previously 3.6 x 4.9 cm. It does appear contiguous with other
low-density masses in the adjacent dome. Liver is enlarged, 22.8 cm.
Gallbladder is unremarkable. Small perihepatic ascites. No biliary
ductal dilatation.

Pancreas: Negative.

Spleen: Enlarged, 17.4 cm.

Adrenals/Urinary Tract: Adrenal glands and kidneys are unremarkable.
Ureters are decompressed. Bladder is thick-walled.

Stomach/Bowel: Stomach, small bowel, appendix and colon are
unremarkable.

Vascular/Lymphatic: Gastroesophageal varices. Left portal vein is
poorly visualized and possibly thrombosed, as before. Periportal
adenopathy measures up to 3.4 cm (2/60), previously 3.1 cm.
Abdominal retroperitoneal lymph nodes measure up to 10 mm (2/72) in
the left periaortic station, similar.

Reproductive: Prostate is normal in size.

Other: Small perihepatic ascites. Mesenteries and peritoneum are
otherwise unremarkable.

Musculoskeletal: Degenerative changes in the spine and hips.
IMPRESSION: 1. Interval progression of metastatic cholangiocarcinoma involving
the liver and periportal lymph nodes. Prepericardiac lymph node is
stable.
2. As before, left portal vein is poorly visualized and may be
thrombosed.
3. Hepatosplenomegaly.
4. Small perihepatic ascites.
5. Bladder wall thickening.
6. Aortic atherosclerosis ([id number]). Coronary artery
calcification.
